# Patient Record
Sex: Female | Born: 1955 | Race: White | Hispanic: No | Marital: Married | Smoking: Never smoker
Health system: Southern US, Community
[De-identification: ages and names within clinical notes are randomized; demographics above are authoritative.]

## PROBLEM LIST (undated history)

## (undated) DIAGNOSIS — D649 Anemia, unspecified: Secondary | ICD-10-CM

## (undated) DIAGNOSIS — C50919 Malignant neoplasm of unspecified site of unspecified female breast: Secondary | ICD-10-CM

## (undated) DIAGNOSIS — E785 Hyperlipidemia, unspecified: Secondary | ICD-10-CM

## (undated) DIAGNOSIS — K219 Gastro-esophageal reflux disease without esophagitis: Secondary | ICD-10-CM

## (undated) DIAGNOSIS — F419 Anxiety disorder, unspecified: Secondary | ICD-10-CM

## (undated) DIAGNOSIS — K8020 Calculus of gallbladder without cholecystitis without obstruction: Secondary | ICD-10-CM

## (undated) DIAGNOSIS — I10 Essential (primary) hypertension: Secondary | ICD-10-CM

## (undated) HISTORY — DX: Essential (primary) hypertension: I10

## (undated) HISTORY — DX: Gastro-esophageal reflux disease without esophagitis: K21.9

## (undated) HISTORY — DX: Malignant neoplasm of unspecified site of unspecified female breast: C50.919

## (undated) HISTORY — DX: Anemia, unspecified: D64.9

## (undated) HISTORY — DX: Hyperlipidemia, unspecified: E78.5

## (undated) HISTORY — DX: Anxiety disorder, unspecified: F41.9

---

## 1997-04-03 HISTORY — PX: BILATERAL SALPINGECTOMY: SHX5743

## 2013-08-18 LAB — LIPID PANEL
CHOLESTEROL: 225 mg/dL — AB (ref 0–200)
HDL: 35 mg/dL (ref 35–70)
LDL Cholesterol: 166 mg/dL
TRIGLYCERIDES: 121 mg/dL (ref 40–160)

## 2014-08-19 LAB — TSH: TSH: 2.58 u[IU]/mL (ref <=5.90)

## 2014-08-19 LAB — HEMOGLOBIN A1C: Hgb A1c MFr Bld: 6 % (ref 4.0–6.0)

## 2014-09-24 LAB — HEPATIC FUNCTION PANEL
ALT: 58 U/L — AB (ref 7–35)
AST: 45 U/L — AB (ref 13–35)

## 2014-09-24 LAB — BASIC METABOLIC PANEL
BUN: 14 mg/dL (ref 4–21)
CREATININE: 0.8 mg/dL (ref 0.5–1.1)
Glucose: 111 mg/dL
Potassium: 4.6 mmol/L (ref 3.4–5.3)
Sodium: 139 mmol/L (ref 137–147)

## 2014-09-24 LAB — CBC AND DIFFERENTIAL
HCT: 39 % (ref 36–46)
HEMOGLOBIN: 13.2 g/dL (ref 12.0–16.0)
PLATELETS: 218 10*3/uL (ref 150–399)
WBC: 7.9 10*3/mL

## 2014-09-28 ENCOUNTER — Ambulatory Visit
Admit: 2014-09-28 | Disposition: A | Discharge status: Home / Self Care | Payer: Self-pay | Attending: Family Medicine | Admitting: Family Medicine

## 2015-02-19 DIAGNOSIS — R1083 Colic: Secondary | ICD-10-CM | POA: Insufficient documentation

## 2015-02-19 DIAGNOSIS — K219 Gastro-esophageal reflux disease without esophagitis: Secondary | ICD-10-CM | POA: Insufficient documentation

## 2015-02-19 DIAGNOSIS — E1169 Type 2 diabetes mellitus with other specified complication: Secondary | ICD-10-CM | POA: Insufficient documentation

## 2015-02-19 DIAGNOSIS — R7303 Prediabetes: Secondary | ICD-10-CM | POA: Insufficient documentation

## 2015-02-19 DIAGNOSIS — E785 Hyperlipidemia, unspecified: Secondary | ICD-10-CM | POA: Insufficient documentation

## 2015-02-19 DIAGNOSIS — F439 Reaction to severe stress, unspecified: Secondary | ICD-10-CM | POA: Insufficient documentation

## 2015-02-19 DIAGNOSIS — D509 Iron deficiency anemia, unspecified: Secondary | ICD-10-CM | POA: Insufficient documentation

## 2015-02-19 DIAGNOSIS — E7800 Pure hypercholesterolemia, unspecified: Secondary | ICD-10-CM | POA: Insufficient documentation

## 2015-02-19 DIAGNOSIS — R739 Hyperglycemia, unspecified: Secondary | ICD-10-CM

## 2015-02-19 DIAGNOSIS — R7989 Other specified abnormal findings of blood chemistry: Secondary | ICD-10-CM | POA: Insufficient documentation

## 2015-02-19 DIAGNOSIS — R945 Abnormal results of liver function studies: Secondary | ICD-10-CM | POA: Insufficient documentation

## 2015-02-19 DIAGNOSIS — E039 Hypothyroidism, unspecified: Secondary | ICD-10-CM | POA: Insufficient documentation

## 2015-02-19 DIAGNOSIS — I10 Essential (primary) hypertension: Secondary | ICD-10-CM | POA: Insufficient documentation

## 2015-02-22 ENCOUNTER — Other Ambulatory Visit: Payer: Self-pay | Admitting: Family Medicine

## 2015-02-22 ENCOUNTER — Ambulatory Visit (INDEPENDENT_AMBULATORY_CARE_PROVIDER_SITE_OTHER): Payer: Self-pay | Admitting: Family Medicine

## 2015-02-22 ENCOUNTER — Encounter: Payer: Self-pay | Admitting: Family Medicine

## 2015-02-22 VITALS — BP 128/80 | HR 88 | Temp 98.3°F | Resp 16 | Ht 63.0 in | Wt 147.0 lb

## 2015-02-22 DIAGNOSIS — E7800 Pure hypercholesterolemia, unspecified: Secondary | ICD-10-CM

## 2015-02-22 DIAGNOSIS — R7309 Other abnormal glucose: Secondary | ICD-10-CM

## 2015-02-22 DIAGNOSIS — E039 Hypothyroidism, unspecified: Secondary | ICD-10-CM

## 2015-02-22 DIAGNOSIS — H8110 Benign paroxysmal vertigo, unspecified ear: Secondary | ICD-10-CM

## 2015-02-22 DIAGNOSIS — R945 Abnormal results of liver function studies: Secondary | ICD-10-CM

## 2015-02-22 DIAGNOSIS — I10 Essential (primary) hypertension: Secondary | ICD-10-CM

## 2015-02-22 DIAGNOSIS — R7303 Prediabetes: Secondary | ICD-10-CM

## 2015-02-22 DIAGNOSIS — R7989 Other specified abnormal findings of blood chemistry: Secondary | ICD-10-CM

## 2015-02-22 NOTE — Progress Notes (Signed)
Patient ID: Diamond Hinton, female   DOB: 1956/04/04, 59 y.o.   MRN: [id number]        Patient: Diamond Hinton Female    DOB: 09-30-1955   59 y.o.   MRN: [id number] Visit Date: 02/22/2015  Today's Provider: Margarita Rana, MD   Chief Complaint  Patient presents with  . Diabetes  . Hypertension  . Hypothyroidism   Subjective:    HPI  Prediabetes, Follow-up:   Lab Results  Component Value Date   HGBA1C 6.0 08/19/2014    Last seen for for this 6 months ago.  Management since that visit includes no changes. Current symptoms include none.  Weight trend: gradually decreasing Prior visit with dietician: no Current diet: in general, a "healthy" diet   Current exercise: walking about 5 to 6 days.  Patient is requesting to have labs checked at [facility].  Pertinent Labs:    Component Value Date/Time   CHOL 225* 08/18/2013   TRIG 121 08/18/2013   CREATININE 0.8 09/24/2014    Wt Readings from Last 3 Encounters:  02/22/15 147 lb (66.679 kg)  09/24/14 155 lb (70.308 kg)     Hypertension, follow-up:  BP Readings from Last 3 Encounters:  02/22/15 128/80  09/24/14 118/64    She was last seen for hypertension 6 months ago.  BP at that visit was 118/64. Management changes since that visit include no changes. She reports excellent compliance with treatment. She is not having side effects.  She is exercising. She is adherent to low salt diet.   Outside blood pressures are being checked and reports WNL. She is experiencing none.  Patient denies chest pain.   Cardiovascular risk factors include none. Has had episodes of vertigo that last for months.  Ended up a Valium for a while.      Weight trend: stable Wt Readings from Last 3 Encounters:  02/22/15 147 lb (66.679 kg)  09/24/14 155 lb (70.308 kg)       Hypothyroid, follow-up:  TSH  Date Value Ref Range Status  08/19/2014 2.58 .41 - 5.90 uIU/mL Final   Wt Readings from Last 3 Encounters:  02/22/15 147 lb  (66.679 kg)  09/24/14 155 lb (70.308 kg)    She was last seen for hypothyroid 6 months ago.  Management since that visit includes no changes. She reports excellent compliance with treatment. She is not having side effects.  She is experiencing none She denies weight changes      Not on File Previous Medications   LEVOTHYROXINE (SYNTHROID) 75 MCG TABLET    Take 1 tablet by mouth daily.    Review of Systems  Constitutional: Negative.   Respiratory: Negative.   Gastrointestinal: Negative.   Endocrine: Negative.   Neurological: Positive for light-headedness.  Psychiatric/Behavioral: Negative.     Social History  Substance Use Topics  . Smoking status: Never Smoker   . Smokeless tobacco: Never Used  . Alcohol Use: No   Objective:   BP 128/80 mmHg  Pulse 88  Temp(Src) 98.3 F (36.8 C) (Oral)  Resp 16  Ht 5\' 3"  (1.6 m)  Wt 147 lb (66.679 kg)  BMI 26.05 kg/m2  Physical Exam  Constitutional: She is oriented to person, place, and time. She appears well-developed and well-nourished.  Cardiovascular: Normal rate and regular rhythm.   Pulmonary/Chest: Effort normal and breath sounds normal.  Neurological: She is alert and oriented to person, place, and time.  Psychiatric: She has a normal mood and affect. Her behavior is normal.  Judgment and thought content normal.      Assessment & Plan:     1. Borderline diabetes Will check labs.  - Hemoglobin A1c  2. Benign essential HTN Stable off medication. Continue to monitor.   3. Hypothyroidism, unspecified hypothyroidism type Condition is stable. Please continue current medication and  plan of care as noted.  Check labs.  - TSH  4. Benign paroxysmal positional vertigo, unspecified laterality Having trouble currently . Will do labyrinth exercises.   5. Hypercholesteremia Will check labs. Wants to work on lifestyle change.  - Comprehensive metabolic panel - Lipid panel  6. Abnormal LFTs Will check labs.   Patient  was seen and examined by Jerrell Belfast, MD, and note scribed, in part,  by Lynford Humphrey, CMA, and reviewed with patient.   I have reviewed the document for accuracy and completeness and I agree with above. - Jerrell Belfast, MD     Margarita Rana, MD  [facility]

## 2015-02-22 NOTE — Patient Instructions (Signed)
Benign Positional Vertigo Vertigo means you feel like you or your surroundings are moving when they are not. Benign positional vertigo is the most common form of vertigo. Benign means that the cause of your condition is not serious. Benign positional vertigo is more common in older adults. CAUSES  Benign positional vertigo is the result of an upset in the labyrinth system. This is an area in the middle ear that helps control your balance. This may be caused by a viral infection, head injury, or repetitive motion. However, often no specific cause is found. SYMPTOMS  Symptoms of benign positional vertigo occur when you move your head or eyes in different directions. Some of the symptoms may include:  Loss of balance and falls.  Vomiting.  Blurred vision.  Dizziness.  Nausea.  Involuntary eye movements (nystagmus). DIAGNOSIS  Benign positional vertigo is usually diagnosed by physical exam. If the specific cause of your benign positional vertigo is unknown, your caregiver may perform imaging tests, such as magnetic resonance imaging (MRI) or computed tomography (CT). TREATMENT  Your caregiver may recommend movements or procedures to correct the benign positional vertigo. Medicines such as meclizine, benzodiazepines, and medicines for nausea may be used to treat your symptoms. In rare cases, if your symptoms are caused by certain conditions that affect the inner ear, you may need surgery. HOME CARE INSTRUCTIONS   Follow your caregiver's instructions.  Move slowly. Do not make sudden body or head movements.  Avoid driving.  Avoid operating heavy machinery.  Avoid performing any tasks that would be dangerous to you or others during a vertigo episode.  Drink enough fluids to keep your urine clear or pale yellow. SEEK IMMEDIATE MEDICAL CARE IF:   You develop problems with walking, weakness, numbness, or using your arms, hands, or legs.  You have difficulty speaking.  You develop  severe headaches.  Your nausea or vomiting continues or gets worse.  You develop visual changes.  Your family or friends notice any behavioral changes.  Your condition gets worse.  You have a fever.  You develop a stiff neck or sensitivity to light. MAKE SURE YOU:   Understand these instructions.  Will watch your condition.  Will get help right away if you are not doing well or get worse. Document Released: 03/06/2006 Document Revised: 08/21/2011 Document Reviewed: 02/16/2011 Exit[employer] Patient Information 2015 [employer]. This information is not intended to replace advice given to you by your health care provider. Make sure you discuss any questions you have with your health care provider.    

## 2015-02-23 ENCOUNTER — Telehealth: Payer: Self-pay

## 2015-02-23 LAB — COMPREHENSIVE METABOLIC PANEL
A/G RATIO: 1.8 (ref 1.1–2.5)
ALBUMIN: 4.6 g/dL (ref 3.6–4.8)
ALK PHOS: 63 IU/L (ref 39–117)
ALT: 19 IU/L (ref 0–32)
AST: 17 IU/L (ref 0–40)
BILIRUBIN TOTAL: 0.4 mg/dL (ref 0.0–1.2)
BUN / CREAT RATIO: 20 (ref 11–26)
BUN: 16 mg/dL (ref 8–27)
CHLORIDE: 102 mmol/L (ref 97–108)
CO2: 25 mmol/L (ref 18–29)
Calcium: 10.1 mg/dL (ref 8.7–10.3)
Creatinine, Ser: 0.81 mg/dL (ref 0.57–1.00)
GFR calc non Af Amer: 79 mL/min/{1.73_m2} (ref >59)
GFR, EST AFRICAN AMERICAN: 91 mL/min/{1.73_m2} (ref >59)
GLOBULIN, TOTAL: 2.6 g/dL (ref 1.5–4.5)
GLUCOSE: 115 mg/dL — AB (ref 65–99)
POTASSIUM: 5.2 mmol/L (ref 3.5–5.2)
SODIUM: 141 mmol/L (ref 134–144)
Total Protein: 7.2 g/dL (ref 6.0–8.5)

## 2015-02-23 LAB — LIPID PANEL
CHOLESTEROL TOTAL: 174 mg/dL (ref 100–199)
Chol/HDL Ratio: 5.3 ratio units — ABNORMAL HIGH (ref 0.0–4.4)
HDL: 33 mg/dL — ABNORMAL LOW (ref >39)
LDL Calculated: 107 mg/dL — ABNORMAL HIGH (ref 0–99)
Triglycerides: 172 mg/dL — ABNORMAL HIGH (ref 0–149)
VLDL Cholesterol Cal: 34 mg/dL (ref 5–40)

## 2015-02-23 LAB — HEMOGLOBIN A1C
ESTIMATED AVERAGE GLUCOSE: 126 mg/dL
HEMOGLOBIN A1C: 6 % — AB (ref 4.8–5.6)

## 2015-02-23 LAB — TSH: TSH: 4.25 u[IU]/mL (ref 0.450–4.500)

## 2015-02-23 NOTE — Telephone Encounter (Signed)
Advised pt as directed below. Pt verbalized fully understanding.  Thanks,   

## 2015-02-23 NOTE — Telephone Encounter (Signed)
-----   Message from Margarita Rana, MD sent at 02/23/2015  7:46 AM EDT ----- Labs stable.  Please notify patient. Thanks.

## 2015-05-24 ENCOUNTER — Other Ambulatory Visit: Payer: Self-pay

## 2015-05-24 DIAGNOSIS — E039 Hypothyroidism, unspecified: Secondary | ICD-10-CM

## 2015-05-24 MED ORDER — LEVOTHYROXINE SODIUM 75 MCG PO TABS: 75.0000 ug | ORAL_TABLET | Freq: Every day | ORAL | Status: DC

## 2015-07-06 ENCOUNTER — Emergency Department: Payer: Self-pay

## 2015-07-06 ENCOUNTER — Encounter: Payer: Self-pay | Admitting: Emergency Medicine

## 2015-07-06 ENCOUNTER — Observation Stay
Admission: EM | Admit: 2015-07-06 | Discharge: 2015-07-08 | Disposition: A | Discharge status: Home / Self Care | Payer: Self-pay | Attending: Surgery | Admitting: Surgery

## 2015-07-06 DIAGNOSIS — I10 Essential (primary) hypertension: Secondary | ICD-10-CM | POA: Insufficient documentation

## 2015-07-06 DIAGNOSIS — K821 Hydrops of gallbladder: Secondary | ICD-10-CM | POA: Insufficient documentation

## 2015-07-06 DIAGNOSIS — K8000 Calculus of gallbladder with acute cholecystitis without obstruction: Principal | ICD-10-CM | POA: Insufficient documentation

## 2015-07-06 DIAGNOSIS — Z8249 Family history of ischemic heart disease and other diseases of the circulatory system: Secondary | ICD-10-CM | POA: Insufficient documentation

## 2015-07-06 DIAGNOSIS — R1011 Right upper quadrant pain: Secondary | ICD-10-CM | POA: Insufficient documentation

## 2015-07-06 DIAGNOSIS — Z419 Encounter for procedure for purposes other than remedying health state, unspecified: Secondary | ICD-10-CM

## 2015-07-06 DIAGNOSIS — E785 Hyperlipidemia, unspecified: Secondary | ICD-10-CM | POA: Insufficient documentation

## 2015-07-06 DIAGNOSIS — K810 Acute cholecystitis: Secondary | ICD-10-CM | POA: Diagnosis present

## 2015-07-06 DIAGNOSIS — K828 Other specified diseases of gallbladder: Secondary | ICD-10-CM | POA: Insufficient documentation

## 2015-07-06 DIAGNOSIS — Z833 Family history of diabetes mellitus: Secondary | ICD-10-CM | POA: Insufficient documentation

## 2015-07-06 DIAGNOSIS — F419 Anxiety disorder, unspecified: Secondary | ICD-10-CM | POA: Insufficient documentation

## 2015-07-06 DIAGNOSIS — D649 Anemia, unspecified: Secondary | ICD-10-CM | POA: Insufficient documentation

## 2015-07-06 DIAGNOSIS — Z91010 Allergy to peanuts: Secondary | ICD-10-CM | POA: Insufficient documentation

## 2015-07-06 DIAGNOSIS — K219 Gastro-esophageal reflux disease without esophagitis: Secondary | ICD-10-CM | POA: Insufficient documentation

## 2015-07-06 DIAGNOSIS — Z823 Family history of stroke: Secondary | ICD-10-CM | POA: Insufficient documentation

## 2015-07-06 HISTORY — DX: Calculus of gallbladder without cholecystitis without obstruction: K80.20

## 2015-07-06 LAB — URINALYSIS COMPLETE WITH MICROSCOPIC (ARMC ONLY)
BACTERIA UA: NONE SEEN
Bilirubin Urine: NEGATIVE
Glucose, UA: NEGATIVE mg/dL
HGB URINE DIPSTICK: NEGATIVE
Ketones, ur: NEGATIVE mg/dL
LEUKOCYTES UA: NEGATIVE
Nitrite: NEGATIVE
PH: 5 (ref 5.0–8.0)
Protein, ur: NEGATIVE mg/dL
Specific Gravity, Urine: 1.016 (ref 1.005–1.030)

## 2015-07-06 LAB — COMPREHENSIVE METABOLIC PANEL
ALT: 21 U/L (ref 14–54)
AST: 20 U/L (ref 15–41)
Albumin: 4.7 g/dL (ref 3.5–5.0)
Alkaline Phosphatase: 64 U/L (ref 38–126)
Anion gap: 9 (ref 5–15)
BUN: 10 mg/dL (ref 6–20)
CHLORIDE: 104 mmol/L (ref 101–111)
CO2: 25 mmol/L (ref 22–32)
CREATININE: 0.84 mg/dL (ref 0.44–1.00)
Calcium: 10.1 mg/dL (ref 8.9–10.3)
GFR calc Af Amer: >60 mL/min (ref >60)
Glucose, Bld: 129 mg/dL — ABNORMAL HIGH (ref 65–99)
POTASSIUM: 3.6 mmol/L (ref 3.5–5.1)
SODIUM: 138 mmol/L (ref 135–145)
Total Bilirubin: 0.3 mg/dL (ref 0.3–1.2)
Total Protein: 7.6 g/dL (ref 6.5–8.1)

## 2015-07-06 LAB — CBC
HEMATOCRIT: 43.6 % (ref 35.0–47.0)
Hemoglobin: 14.5 g/dL (ref 12.0–16.0)
MCH: 28.1 pg (ref 26.0–34.0)
MCHC: 33.3 g/dL (ref 32.0–36.0)
MCV: 84.3 fL (ref 80.0–100.0)
Platelets: 244 10*3/uL (ref 150–440)
RBC: 5.17 MIL/uL (ref 3.80–5.20)
RDW: 13.4 % (ref 11.5–14.5)
WBC: 13 10*3/uL — AB (ref 3.6–11.0)

## 2015-07-06 LAB — LIPASE, BLOOD: LIPASE: 31 U/L (ref 11–51)

## 2015-07-06 MED ORDER — KCL IN DEXTROSE-NACL 20-5-0.9 MEQ/L-%-% IV SOLN
INTRAVENOUS | Status: DC
  Administered 2015-07-06 – 2015-07-08 (×4): via INTRAVENOUS
  Filled 2015-07-06 (×10): qty 1000

## 2015-07-06 MED ORDER — PROMETHAZINE HCL 25 MG PO TABS: 12.5000 mg | ORAL_TABLET | Freq: Four times a day (QID) | ORAL | Status: DC | PRN

## 2015-07-06 MED ORDER — HEPARIN SODIUM (PORCINE) 5000 UNIT/ML IJ SOLN
5000.0000 [IU] | Freq: Three times a day (TID) | INTRAMUSCULAR | Status: DC
Start: 2015-07-06 — End: 2015-07-08
  Filled 2015-07-06 (×2): qty 1

## 2015-07-06 MED ORDER — SODIUM CHLORIDE 0.9 % IV BOLUS (SEPSIS)
1000.0000 mL | Freq: Once | INTRAVENOUS | Status: AC
  Administered 2015-07-06: 1000 mL via INTRAVENOUS

## 2015-07-06 MED ORDER — MORPHINE SULFATE (PF) 4 MG/ML IV SOLN
INTRAVENOUS | Status: AC
  Administered 2015-07-06: 4 mg via INTRAVENOUS
  Filled 2015-07-06: qty 1

## 2015-07-06 MED ORDER — CEFAZOLIN SODIUM 1-5 GM-% IV SOLN
1.0000 g | Freq: Four times a day (QID) | INTRAVENOUS | Status: DC
  Administered 2015-07-06 – 2015-07-07 (×3): 1 g via INTRAVENOUS
  Filled 2015-07-06 (×5): qty 50

## 2015-07-06 MED ORDER — METOPROLOL TARTRATE 25 MG PO TABS
25.0000 mg | ORAL_TABLET | Freq: Two times a day (BID) | ORAL | Status: DC
  Administered 2015-07-06 – 2015-07-07 (×3): 25 mg via ORAL
  Filled 2015-07-06 (×4): qty 1

## 2015-07-06 MED ORDER — ONDANSETRON HCL 4 MG/2ML IJ SOLN
4.0000 mg | Freq: Once | INTRAMUSCULAR | Status: AC
  Administered 2015-07-06: 4 mg via INTRAVENOUS

## 2015-07-06 MED ORDER — HYDROMORPHONE HCL 1 MG/ML IJ SOLN
0.5000 mg | INTRAMUSCULAR | Status: DC | PRN
  Administered 2015-07-06 – 2015-07-07 (×7): 0.5 mg via INTRAVENOUS
  Filled 2015-07-06 (×7): qty 1

## 2015-07-06 MED ORDER — ONDANSETRON HCL 4 MG/2ML IJ SOLN
INTRAMUSCULAR | Status: AC
  Administered 2015-07-06: 4 mg via INTRAVENOUS
  Filled 2015-07-06: qty 2

## 2015-07-06 MED ORDER — ONDANSETRON HCL 4 MG/2ML IJ SOLN
4.0000 mg | Freq: Once | INTRAMUSCULAR | Status: AC
  Administered 2015-07-06: 4 mg via INTRAVENOUS
  Filled 2015-07-06: qty 2

## 2015-07-06 MED ORDER — LEVOTHYROXINE SODIUM 75 MCG PO TABS
75.0000 ug | ORAL_TABLET | Freq: Every day | ORAL | Status: DC
  Filled 2015-07-06: qty 1

## 2015-07-06 MED ORDER — MORPHINE SULFATE (PF) 4 MG/ML IV SOLN
4.0000 mg | Freq: Once | INTRAVENOUS | Status: AC
  Administered 2015-07-06: 4 mg via INTRAVENOUS

## 2015-07-06 MED ORDER — HYDROMORPHONE HCL 1 MG/ML IJ SOLN
0.5000 mg | Freq: Once | INTRAMUSCULAR | Status: AC
  Administered 2015-07-06: 0.5 mg via INTRAVENOUS

## 2015-07-06 MED ORDER — HYDROMORPHONE HCL 1 MG/ML IJ SOLN
INTRAMUSCULAR | Status: AC
  Administered 2015-07-06: 0.5 mg via INTRAVENOUS
  Filled 2015-07-06: qty 1

## 2015-07-06 MED ORDER — PANTOPRAZOLE SODIUM 40 MG IV SOLR
40.0000 mg | Freq: Two times a day (BID) | INTRAVENOUS | Status: DC
  Administered 2015-07-06 – 2015-07-07 (×3): 40 mg via INTRAVENOUS
  Filled 2015-07-06 (×4): qty 40

## 2015-07-06 MED ORDER — PROMETHAZINE HCL 25 MG RE SUPP: 25.0000 mg | Freq: Once | RECTAL | Status: DC

## 2015-07-06 NOTE — Progress Notes (Signed)
Pt arrived on unit. BP 177/84. Schedule Lopressor was given. Will reassess BP and monitor pt.  Diamond Hinton

## 2015-07-06 NOTE — ED Notes (Signed)
Patient transported to Ultrasound 

## 2015-07-06 NOTE — ED Provider Notes (Signed)
[facility] Emergency Department Provider Note    ____________________________________________  Time seen: 1650  I have reviewed the triage vital signs and the nursing notes.   HISTORY  Chief Complaint Abdominal Pain   History limited by: Not Limited   HPI Diamond Hinton is a 60 y.o. female who presents to the emergency department today because of concern for epigastric pain and gallbladder disease. The patient starts that the symptoms started roughly 14 hours ago. She states that it wasn't severe. It was located in the epigastric region. She states she did eat a high fat meal last night. She tried taking oral medications without great relief of the pain. She did have some nausea. She denies any fevers. She states she was diagnosed with gallstones last year although never followed up with a Psychologist, sport and exercise. She had been trying diet control.     Past Medical History  Diagnosis Date  . Anxiety   . Hypertension   . Hyperlipidemia   . GERD (gastroesophageal reflux disease)   . Anemia   . Gallstones     Patient Active Problem List   Diagnosis Date Noted  . Benign paroxysmal positional vertigo 02/22/2015  . Abdominal colic [id number]  . Benign essential HTN 02/19/2015  . Hypercholesteremia 02/19/2015  . Abnormal LFTs 02/19/2015  . Acid reflux 02/19/2015  . Adult hypothyroidism 02/19/2015  . Anemia, iron deficiency 02/19/2015  . Feeling stressed out 02/19/2015  . Borderline diabetes 02/19/2015    Past Surgical History  Procedure Laterality Date  . Bilateral salpingectomy  04/03/1997    Current Outpatient Rx  Name  Route  Sig  Dispense  Refill  . levothyroxine (SYNTHROID) 75 MCG tablet   Oral   Take 1 tablet (75 mcg total) by mouth daily.   90 tablet   1     Allergies Review of patient's allergies indicates no known allergies.  Family History  Problem Relation Age of Onset  . Hypertension Mother   . CVA Mother   . Ulcers Mother   . Heart  disease Father   . Hypertension Sister   . Diabetes Sister   . Hypertension Sister   . Diabetes Sister     Social History Social History  Substance Use Topics  . Smoking status: Never Smoker   . Smokeless tobacco: Never Used  . Alcohol Use: No    Review of Systems  Constitutional: Negative for fever. Cardiovascular: Negative for chest pain. Respiratory: Negative for shortness of breath. Gastrointestinal: Positive for abdominal pain. Neurological: Negative for headaches, focal weakness or numbness.   10-point ROS otherwise negative.  ____________________________________________   PHYSICAL EXAM:  VITAL SIGNS: ED Triage Vitals  Enc Vitals Group     BP 07/06/15 1510 174/99 mmHg     Pulse Rate 07/06/15 1510 100     Resp 07/06/15 1510 18     Temp 07/06/15 1510 98 F (36.7 C)     Temp Source 07/06/15 1510 Oral     SpO2 07/06/15 1510 100 %     Weight 07/06/15 1510 150 lb (68.04 kg)     Height 07/06/15 1510 5\' 3"  (1.6 m)     Head Cir --      Peak Flow --      Pain Score 07/06/15 1511 7   Constitutional: Alert and oriented. Well appearing and in no distress. Eyes: Conjunctivae are normal. PERRL. Normal extraocular movements. ENT   Head: Normocephalic and atraumatic.   Nose: No congestion/rhinnorhea.   Mouth/Throat: Mucous membranes are  moist.   Neck: No stridor. Hematological/Lymphatic/Immunilogical: No cervical lymphadenopathy. Cardiovascular: Normal rate, regular rhythm.  No murmurs, rubs, or gallops. Respiratory: Normal respiratory effort without tachypnea nor retractions. Breath sounds are clear and equal bilaterally. No wheezes/rales/rhonchi. Gastrointestinal: Soft and moderately tender to palpation in the right upper quadrant and epigastric region. Genitourinary: Deferred Musculoskeletal: Normal range of motion in all extremities. No joint effusions.  No lower extremity tenderness nor edema. Neurologic:  Normal speech and language. No gross focal  neurologic deficits are appreciated.  Skin:  Skin is warm, dry and intact. No rash noted. Psychiatric: Mood and affect are normal. Speech and behavior are normal. Patient exhibits appropriate insight and judgment.  ____________________________________________    LABS (pertinent positives/negatives)  Labs Reviewed  COMPREHENSIVE METABOLIC PANEL - Abnormal; Notable for the following:    Glucose, Bld 129 (*)    All other components within normal limits  CBC - Abnormal; Notable for the following:    WBC 13.0 (*)    All other components within normal limits  URINALYSIS COMPLETEWITH MICROSCOPIC ([facility] ONLY) - Abnormal; Notable for the following:    Color, Urine YELLOW (*)    APPearance CLEAR (*)    Squamous Epithelial / LPF 0-5 (*)    All other components within normal limits  LIPASE, BLOOD    ____________________________________________   EKG  I, Nance Pear, attending physician, personally viewed and interpreted this EKG  EKG Time: 1518 Rate: 81 Rhythm: normal sinus rhythm Axis: left axis deviation Intervals: qtc 441 QRS: incomplete RBBB ST changes: no st elevation Impression: abnormal ekg ____________________________________________    RADIOLOGY  US abdomen RUQ  IMPRESSION: Cholelithiasis without sonographic evidence for acute cholecystitis. ____________________________________________   PROCEDURES  Procedure(s) performed: None  Critical Care performed: No  ____________________________________________   INITIAL IMPRESSION / ASSESSMENT AND PLAN / ED COURSE  Pertinent labs & imaging results that were available during my care of the patient were reviewed by me and considered in my medical decision making (see chart for details).  Patient presented to the emergency department today because of concerns for epigastric pain and a gallbladder attack. She has a history of known gallbladders. Ultrasound without any acute findings of cholecystitis. Patient with  mild leukocytosis. Patient's pain was not able to be controlled on IV medications and surgery was consulted. They will plan on admitting the patient.  ____________________________________________   FINAL CLINICAL IMPRESSION(S) / ED DIAGNOSES  Final diagnoses:  RUQ pain     Nance Pear, MD 07/06/15 2013

## 2015-07-06 NOTE — ED Notes (Signed)
Patient states "I'm having a gallbladder attack".  Onset of symptoms 0200.  States ate a high fat dinner last night and awoke at 0200 with pain.  Has taken Peptobismol, ibuprophen, tylenol, no effect.  Patient has known gallstones.

## 2015-07-06 NOTE — H&P (Signed)
Diamond Hinton is an 60 y.o. female.    Chief Complaint: Right upper quadrant and epigastric pain  HPI: This a patient who is had multiple episodes of right upper quadrant pain associated fatty food intolerance in the past in fact she was told in me that she had gallstones on an ultrasound that was performed for her symptoms. She's had this happened multiple times but this time it started at 2 in the morning following a fatty meal and is been unrelenting she take Zantac with no improvement and she took some Pepto-Bismol that had minor improvement. She's had nausea but no emesis and no fevers or chills no jaundice or acholic stools and no melena or hematochezia she's had normal bowel movements.  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 Anxiety   . Hypertension   . Hyperlipidemia   . GERD (gastroesophageal reflux disease)   . Anemia   . Gallstones     Past Surgical History  Procedure Laterality Date  . Bilateral salpingectomy  04/03/1997    Family History  Problem Relation Age of Onset  . Hypertension Mother   . CVA Mother   . Ulcers Mother   . Heart disease Father   . Hypertension Sister   . Diabetes Sister   . Hypertension Sister   . Diabetes Sister    Social History:  reports that she has never smoked. She has never used smokeless tobacco. She reports that she does not drink alcohol or use illicit drugs.  Allergies:  Allergies  Allergen Reactions  . Peanuts [Peanut Oil] Shortness Of Breath and Itching     (Not in a hospital admission)   Review of Systems  Constitutional: Negative for fever, chills and weight loss.  HENT: Negative.   Eyes: Negative.   Respiratory: Negative.   Cardiovascular: Negative.   Gastrointestinal: Positive for nausea and abdominal pain. Negative for heartburn, vomiting, diarrhea, constipation, blood in stool and melena.  Genitourinary: Negative.    Musculoskeletal: Negative.   Skin: Negative.   Neurological: Negative.   Endo/Heme/Allergies: Negative.   Psychiatric/Behavioral: Negative.      Physical Exam:  BP 160/79 mmHg  Pulse 73  Temp(Src) 98 F (36.7 C) (Oral)  Resp 18  Ht 5' 3"  (1.6 m)  Wt 150 lb (68.04 kg)  BMI 26.58 kg/m2  SpO2 100%  Physical Exam  Constitutional: She is oriented to person, place, and time and well-developed, well-nourished, and in no distress. No distress.  Appears uncomfortable  HENT:  Head: Normocephalic and atraumatic.  Eyes: Pupils are equal, round, and reactive to light. Right eye exhibits no discharge. Left eye exhibits no discharge. No scleral icterus.  Neck: Normal range of motion. Neck supple.  Cardiovascular: Normal rate, regular rhythm and normal heart sounds.   Pulmonary/Chest: Effort normal and breath sounds normal. No respiratory distress. She has no wheezes.  Abdominal: Soft. She exhibits no distension. There is tenderness. There is no rebound and no guarding.  Tenderness maximal in the right upper quadrant with a positive Murphy sign  Musculoskeletal: Normal range of motion. She exhibits no edema.  Lymphadenopathy:    She has no cervical adenopathy.  Neurological: She is alert and oriented to person, place, and time.  Skin: Skin is warm and dry. No rash noted. She is not diaphoretic. No erythema.  Psychiatric: Mood and affect normal.  Vitals reviewed.       Results  for orders placed or performed during the hospital encounter of 07/06/15 (from the past 48 hour(s))  Lipase, blood     Status: None   Collection Time: 07/06/15  3:24 PM  Result Value Ref Range   Lipase 31 11 - 51 U/L  Comprehensive metabolic panel     Status: Abnormal   Collection Time: 07/06/15  3:24 PM  Result Value Ref Range   Sodium 138 135 - 145 mmol/L   Potassium 3.6 3.5 - 5.1 mmol/L   Chloride 104 101 - 111 mmol/L   CO2 25 22 - 32 mmol/L   Glucose, Bld 129 (H) 65 - 99 mg/dL   BUN 10 6 - 20  mg/dL   Creatinine, Ser 0.84 0.44 - 1.00 mg/dL   Calcium 10.1 8.9 - 10.3 mg/dL   Total Protein 7.6 6.5 - 8.1 g/dL   Albumin 4.7 3.5 - 5.0 g/dL   AST 20 15 - 41 U/L   ALT 21 14 - 54 U/L   Alkaline Phosphatase 64 38 - 126 U/L   Total Bilirubin 0.3 0.3 - 1.2 mg/dL   GFR calc non Af Amer >60 >60 mL/min   GFR calc Af Amer >60 >60 mL/min    Comment: (NOTE) The eGFR has been calculated using the CKD EPI equation. This calculation has not been validated in all clinical situations. eGFR's persistently <60 mL/min signify possible Chronic Kidney Disease.    Anion gap 9 5 - 15  CBC     Status: Abnormal   Collection Time: 07/06/15  3:24 PM  Result Value Ref Range   WBC 13.0 (H) 3.6 - 11.0 K/uL   RBC 5.17 3.80 - 5.20 MIL/uL   Hemoglobin 14.5 12.0 - 16.0 g/dL   HCT 43.6 35.0 - 47.0 %   MCV 84.3 80.0 - 100.0 fL   MCH 28.1 26.0 - 34.0 pg   MCHC 33.3 32.0 - 36.0 g/dL   RDW 13.4 11.5 - 14.5 %   Platelets 244 150 - 440 K/uL  Urinalysis complete, with microscopic ([facility] only)     Status: Abnormal   Collection Time: 07/06/15  3:24 PM  Result Value Ref Range   Color, Urine YELLOW (A) YELLOW   APPearance CLEAR (A) CLEAR   Glucose, UA NEGATIVE NEGATIVE mg/dL   Bilirubin Urine NEGATIVE NEGATIVE   Ketones, ur NEGATIVE NEGATIVE mg/dL   Specific Gravity, Urine 1.016 1.005 - 1.030   Hgb urine dipstick NEGATIVE NEGATIVE   pH 5.0 5.0 - 8.0   Protein, ur NEGATIVE NEGATIVE mg/dL   Nitrite NEGATIVE NEGATIVE   Leukocytes, UA NEGATIVE NEGATIVE   RBC / HPF 0-5 0 - 5 RBC/hpf   WBC, UA 0-5 0 - 5 WBC/hpf   Bacteria, UA NONE SEEN NONE SEEN   Squamous Epithelial / LPF 0-5 (A) NONE SEEN   Mucous PRESENT    US Abdomen Limited Ruq  07/06/2015  CLINICAL DATA:  Patient with right upper quadrant pain.  Nausea. EXAM: US ABDOMEN LIMITED - RIGHT UPPER QUADRANT COMPARISON:  None. FINDINGS: Gallbladder: Multiple mobile echogenic stones are demonstrated within the gallbladder lumen. No gallbladder wall thickening. No  pericholecystic fluid. Negative sonographic Murphy sign. Common bile duct: Diameter: 4 mm Liver: No focal lesion identified. Within normal limits in parenchymal echogenicity. IMPRESSION: Cholelithiasis without sonographic evidence for acute cholecystitis. Electronically Signed   By: Lovey Newcomer M.D.   On: 07/06/2015 16:33     Assessment/Plan  Labs are personally reviewed. An ultrasound shows stones but no cholecystic fluid or wall  thickening and a negative sonographic Murphy sign but clinically the patient does have a positive Murphy sign. Her history is consistent with recurrent episodic biliary colic-like symptoms which is now unrelenting which is consistent with a diagnosis of acute cholecystitis.  I recommended admission to the hospital hydration starting IV antibiotics and controlling her nausea and pain then proceeding with laparoscopic cholecystectomy tomorrow. I discussed with she and her family the rationale for this approach the options of observation the risks of bleeding infection recurrence of symptoms failure to resolve all of her symptoms (procedure bile duct damage bile duct leak retained common bile duct stone any of which could require further surgery and/or ERCP stent and papillotomy this reviewed for them they understood and agreed to proceed.  Florene Glen, MD, FACS

## 2015-07-07 ENCOUNTER — Observation Stay: Payer: Self-pay | Admitting: Anesthesiology

## 2015-07-07 ENCOUNTER — Encounter
Admission: EM | Disposition: A | Discharge status: Home / Self Care | Payer: Self-pay | Source: Home / Self Care | Attending: Emergency Medicine

## 2015-07-07 ENCOUNTER — Encounter: Payer: Self-pay | Admitting: *Deleted

## 2015-07-07 DIAGNOSIS — K810 Acute cholecystitis: Secondary | ICD-10-CM

## 2015-07-07 HISTORY — PX: GALLBLADDER SURGERY: SHX652

## 2015-07-07 HISTORY — PX: CHOLECYSTECTOMY: SHX55

## 2015-07-07 LAB — CBC
HEMATOCRIT: 40.1 % (ref 35.0–47.0)
HEMOGLOBIN: 13.6 g/dL (ref 12.0–16.0)
MCH: 29.2 pg (ref 26.0–34.0)
MCHC: 33.9 g/dL (ref 32.0–36.0)
MCV: 86.2 fL (ref 80.0–100.0)
Platelets: 227 10*3/uL (ref 150–440)
RBC: 4.66 MIL/uL (ref 3.80–5.20)
RDW: 13.1 % (ref 11.5–14.5)
WBC: 17 10*3/uL — AB (ref 3.6–11.0)

## 2015-07-07 LAB — COMPREHENSIVE METABOLIC PANEL
ALBUMIN: 4 g/dL (ref 3.5–5.0)
ALT: 17 U/L (ref 14–54)
ANION GAP: 7 (ref 5–15)
AST: 18 U/L (ref 15–41)
Alkaline Phosphatase: 52 U/L (ref 38–126)
BILIRUBIN TOTAL: 0.3 mg/dL (ref 0.3–1.2)
BUN: 8 mg/dL (ref 6–20)
CALCIUM: 8.5 mg/dL — AB (ref 8.9–10.3)
CO2: 26 mmol/L (ref 22–32)
Chloride: 100 mmol/L — ABNORMAL LOW (ref 101–111)
Creatinine, Ser: 0.84 mg/dL (ref 0.44–1.00)
GLUCOSE: 167 mg/dL — AB (ref 65–99)
POTASSIUM: 3.5 mmol/L (ref 3.5–5.1)
Sodium: 133 mmol/L — ABNORMAL LOW (ref 135–145)
TOTAL PROTEIN: 6.7 g/dL (ref 6.5–8.1)

## 2015-07-07 LAB — MRSA PCR SCREENING: MRSA BY PCR: NEGATIVE

## 2015-07-07 SURGERY — LAPAROSCOPIC CHOLECYSTECTOMY WITH INTRAOPERATIVE CHOLANGIOGRAM
Anesthesia: General | Site: Abdomen

## 2015-07-07 MED ORDER — METRONIDAZOLE IN NACL 5-0.79 MG/ML-% IV SOLN
INTRAVENOUS | Status: DC | PRN
  Administered 2015-07-07: 500 mg via INTRAVENOUS

## 2015-07-07 MED ORDER — KETOROLAC TROMETHAMINE 30 MG/ML IJ SOLN
INTRAMUSCULAR | Status: DC | PRN
  Administered 2015-07-07: 30 mg via INTRAVENOUS

## 2015-07-07 MED ORDER — DEXTROSE 5 % IV SOLN
1.0000 g | Freq: Four times a day (QID) | INTRAVENOUS | Status: AC
  Administered 2015-07-08 (×2): 1 g via INTRAVENOUS
  Filled 2015-07-07 (×2): qty 1

## 2015-07-07 MED ORDER — METRONIDAZOLE IN NACL 5-0.79 MG/ML-% IV SOLN
500.0000 mg | Freq: Once | INTRAVENOUS | Status: DC
  Filled 2015-07-07: qty 100

## 2015-07-07 MED ORDER — LACTATED RINGERS IV SOLN
INTRAVENOUS | Status: DC
  Administered 2015-07-07: 14:00:00 via INTRAVENOUS

## 2015-07-07 MED ORDER — BUPIVACAINE-EPINEPHRINE 0.25% -1:200000 IJ SOLN
INTRAMUSCULAR | Status: DC | PRN
  Administered 2015-07-07: 30 mL

## 2015-07-07 MED ORDER — SUGAMMADEX SODIUM 200 MG/2ML IV SOLN
INTRAVENOUS | Status: DC | PRN
  Administered 2015-07-07: 142.4 mg via INTRAVENOUS

## 2015-07-07 MED ORDER — PROPOFOL 10 MG/ML IV BOLUS
INTRAVENOUS | Status: DC | PRN
  Administered 2015-07-07: 150 mg via INTRAVENOUS

## 2015-07-07 MED ORDER — ONDANSETRON HCL 4 MG/2ML IJ SOLN
INTRAMUSCULAR | Status: DC | PRN
  Administered 2015-07-07: 4 mg via INTRAVENOUS

## 2015-07-07 MED ORDER — DEXTROSE 5 % IV SOLN
1.0000 g | Freq: Four times a day (QID) | INTRAVENOUS | Status: DC
  Administered 2015-07-07: 1 g via INTRAVENOUS
  Filled 2015-07-07 (×3): qty 1

## 2015-07-07 MED ORDER — CEFAZOLIN SODIUM-DEXTROSE 2-3 GM-% IV SOLR
INTRAVENOUS | Status: DC | PRN
  Administered 2015-07-07: 2 g via INTRAVENOUS

## 2015-07-07 MED ORDER — ACETAMINOPHEN 10 MG/ML IV SOLN
INTRAVENOUS | Status: AC
  Filled 2015-07-07: qty 100

## 2015-07-07 MED ORDER — ONDANSETRON HCL 4 MG/2ML IJ SOLN: 4.0000 mg | Freq: Once | INTRAMUSCULAR | Status: DC | PRN

## 2015-07-07 MED ORDER — MIDAZOLAM HCL 5 MG/5ML IJ SOLN: INTRAMUSCULAR | Status: DC | PRN

## 2015-07-07 MED ORDER — ACETAMINOPHEN 500 MG PO TABS: 500.0000 mg | ORAL_TABLET | Freq: Four times a day (QID) | ORAL | Status: DC | PRN

## 2015-07-07 MED ORDER — LIDOCAINE HCL (CARDIAC) 20 MG/ML IV SOLN
INTRAVENOUS | Status: DC | PRN
  Administered 2015-07-07: 60 mg via INTRAVENOUS

## 2015-07-07 MED ORDER — DEXAMETHASONE SODIUM PHOSPHATE 10 MG/ML IJ SOLN
INTRAMUSCULAR | Status: DC | PRN
  Administered 2015-07-07: 10 mg via INTRAVENOUS

## 2015-07-07 MED ORDER — HYDROCHLOROTHIAZIDE 25 MG PO TABS: 25.0000 mg | ORAL_TABLET | Freq: Every day | ORAL | Status: DC | PRN

## 2015-07-07 MED ORDER — FENTANYL CITRATE (PF) 100 MCG/2ML IJ SOLN: 25.0000 ug | INTRAMUSCULAR | Status: DC | PRN

## 2015-07-07 MED ORDER — SUCCINYLCHOLINE CHLORIDE 20 MG/ML IJ SOLN
INTRAMUSCULAR | Status: DC | PRN
  Administered 2015-07-07: 100 mg via INTRAVENOUS

## 2015-07-07 MED ORDER — MIDAZOLAM HCL 5 MG/5ML IJ SOLN
INTRAMUSCULAR | Status: DC | PRN
  Administered 2015-07-07: 2 mg via INTRAVENOUS

## 2015-07-07 MED ORDER — FENTANYL CITRATE (PF) 250 MCG/5ML IJ SOLN
INTRAMUSCULAR | Status: DC | PRN
  Administered 2015-07-07 (×2): 50 ug via INTRAVENOUS

## 2015-07-07 MED ORDER — ACETAMINOPHEN 10 MG/ML IV SOLN
INTRAVENOUS | Status: DC | PRN
  Administered 2015-07-07: 1000 mg via INTRAVENOUS

## 2015-07-07 MED ORDER — ROCURONIUM BROMIDE 100 MG/10ML IV SOLN
INTRAVENOUS | Status: DC | PRN
  Administered 2015-07-07: 30 mg via INTRAVENOUS
  Administered 2015-07-07 (×2): 10 mg via INTRAVENOUS

## 2015-07-07 SURGICAL SUPPLY — 40 items
APPLIER CLIP 5 13 M/L LIGAMAX5 (MISCELLANEOUS) ×3
APPLIER CLIP LOGIC TI 5 (MISCELLANEOUS) ×3 IMPLANT
BACTOSHIELD CHG 4% 4OZ (MISCELLANEOUS) ×2
BAG DECANTER STRL (MISCELLANEOUS) ×3 IMPLANT
BLADE CLIPPER SURG (BLADE) ×3 IMPLANT
BLADE SURG SZ11 CARB STEEL (BLADE) ×3 IMPLANT
BRUSH SCRUB 4% CHG (MISCELLANEOUS) ×3 IMPLANT
CANISTER SUCT 3000ML PPV (MISCELLANEOUS) ×3 IMPLANT
CHLORAPREP W/TINT 26ML (MISCELLANEOUS) ×3 IMPLANT
CHOLANGIOGRAM CATH TAUT (CATHETERS) IMPLANT
CLEANER CAUTERY TIP 5X5 PAD (MISCELLANEOUS) IMPLANT
CLIP APPLIE 5 13 M/L LIGAMAX5 (MISCELLANEOUS) ×1 IMPLANT
DEVICE TROCAR PUNCTURE CLOSURE (ENDOMECHANICALS) ×3 IMPLANT
DRAPE C-ARM XRAY 36X54 (DRAPES) ×3 IMPLANT
ELECT REM PT RETURN 9FT ADLT (ELECTROSURGICAL) ×3
ELECTRODE REM PT RTRN 9FT ADLT (ELECTROSURGICAL) ×1 IMPLANT
ENDOPOUCH RETRIEVER 10 (MISCELLANEOUS) ×3 IMPLANT
GLOVE BIO SURGEON STRL SZ7 (GLOVE) ×3 IMPLANT
GOWN STRL REUS W/ TWL LRG LVL3 (GOWN DISPOSABLE) ×2 IMPLANT
GOWN STRL REUS W/TWL LRG LVL3 (GOWN DISPOSABLE) ×4
IRRIGATION STRYKERFLOW (MISCELLANEOUS) ×1 IMPLANT
IRRIGATOR STRYKERFLOW (MISCELLANEOUS) ×3
IV NS 100ML SINGLE PACK (IV SOLUTION) ×3 IMPLANT
LIQUID BAND (GAUZE/BANDAGES/DRESSINGS) ×6 IMPLANT
MARKER SKIN DUAL TIP RULER LAB (MISCELLANEOUS) ×3 IMPLANT
PACK LAP CHOLECYSTECTOMY (MISCELLANEOUS) ×3 IMPLANT
PAD CLEANER CAUTERY TIP 5X5 (MISCELLANEOUS)
PENCIL ELECTRO HAND CTR (MISCELLANEOUS) ×3 IMPLANT
POUCH ENDO CATCH 10MM SPEC (MISCELLANEOUS) ×3 IMPLANT
SCISSORS METZENBAUM CVD 33 (INSTRUMENTS) ×3 IMPLANT
SCRUB CHG 4% DYNA-HEX 4OZ (MISCELLANEOUS) ×1 IMPLANT
STOPCOCK 3 WAY  REPLAC (MISCELLANEOUS) IMPLANT
SUT ETHIBOND NAB MO 7 #0 18IN (SUTURE) ×3 IMPLANT
SUT MNCRL AB 4-0 PS2 18 (SUTURE) ×3 IMPLANT
SUT VIC AB 0 CT1 36 (SUTURE) ×3 IMPLANT
SUT VICRYL 0 AB UR-6 (SUTURE) ×6 IMPLANT
SYR 20CC LL (SYRINGE) ×6 IMPLANT
TROCAR BALLN 12MMX100 BLUNT (TROCAR) ×3 IMPLANT
TROCAR Z-THREAD OPTICAL 5X100M (TROCAR) ×9 IMPLANT
WATER STERILE IRR 1000ML POUR (IV SOLUTION) ×3 IMPLANT

## 2015-07-07 NOTE — Anesthesia Preprocedure Evaluation (Addendum)
Anesthesia Evaluation  Patient identified by MRN, date of birth, ID band Patient awake    Reviewed: Allergy & Precautions, NPO status , Patient's Chart, lab work & pertinent test results  History of Anesthesia Complications Negative for: history of anesthetic complications  Airway Mallampati: II       Dental  (+) Teeth Intact   Pulmonary neg pulmonary ROS,           Cardiovascular hypertension, Pt. on medications      Neuro/Psych Anxiety negative neurological ROS     GI/Hepatic Neg liver ROS, GERD  Medicated,  Endo/Other  Hypothyroidism Borderline  Renal/GU negative Renal ROS     Musculoskeletal   Abdominal   Peds  Hematology  (+) anemia ,   Anesthesia Other Findings   Reproductive/Obstetrics                            Anesthesia Physical Anesthesia Plan  ASA: II  Anesthesia Plan: General   Post-op Pain Management:    Induction: Intravenous  Airway Management Planned:   Additional Equipment:   Intra-op Plan:   Post-operative Plan:   Informed Consent: I have reviewed the patients History and Physical, chart, labs and discussed the procedure including the risks, benefits and alternatives for the proposed anesthesia with the patient or authorized representative who has indicated his/her understanding and acceptance.     Plan Discussed with:   Anesthesia Plan Comments:         Anesthesia Quick Evaluation

## 2015-07-07 NOTE — Op Note (Addendum)
07/06/2015 - 07/07/2015  5:58 PM  PATIENT:  Diamond Hinton  60 y.o. female  PRE-OPERATIVE DIAGNOSIS:  Cholecystitis  POST-OPERATIVE DIAGNOSIS:  Cholecystitis  PROCEDURE:  Procedure(s): LAPAROSCOPIC CHOLECYSTECTOMY  (N/A)  Laparoscopic lysis of adhesions taking 30 minutes operative time  SURGEON:  Surgeon(s) and Role:    * Nayleen Janosik F Dyon Rotert, MD - Primary  FINDINGS: Thick adhesions from GB to omentum and GB to duodenum                    Hydrops c/w Acute Calculus Cholecystitis PHYSICIAN ASSISTANT:N/A   ASSISTANTS: none   ANESTHESIA:   general  EBL:  Total I/O In: 1586 [I.V.:1586] Out: 850 [Urine:800; Blood:50]  BLOOD ADMINISTERED:none  DRAINS: none   LOCAL MEDICATIONS USED:  MARCAINE     SPECIMEN:  Source of Specimen:  GB  DISPOSITION OF SPECIMEN:  PATHOLOGY  COUNTS:  YES  TOURNIQUET:  * No tourniquets in log *  DICTATION: See below  PLAN OF CARE: Admit for overnight observation  PATIENT DISPOSITION:  PACU - hemodynamically stable.   Delay start of Pharmacological VTE agent (>24hrs) due to surgical blood loss or risk of bleeding: not applicable  The patient was taken to the OR and placed in a supine position. After induction of GETA she was prepped and draped in the usual fashion. Time out performed and appropriate antibiotics were given.  Supraumbilical incision created and fascia was divided between kocher clamps. Peritoneal cavity entered under direct visualization and stay sutures placed. Hasson trochar inserted and pneumoperitoneum obtained. Three 5 mm trocars placed on the RUQ. Fundus Retracted superiorly. Evidence of hydrops and this was evacuated with an aspiration needle. Significant adhesions from the duodenum to the GB and from the omentum to the GB were lysed using a combination of scissors and cautery. Once we had a good window we dissected the cystic artery and duct. After the window of safety was visualized the cystic duct and artery were double clipped and  divided. The Gallbladder was removed from its fossa with electrocautery. Cautery was used to cauterize the liver bed  That was raw from the inflammatory response. No evidence of active bleeding or bile . GB was removed via the endocacth bag and liver bed irrigated.  Good hemostasis visualized. All ports were removed and the umbilical fascia closed with interrupted vicryl sutures. Marcaine was injected on all incisions and the skin was closed with 4-0 monocryl. Dermabond was used to coat the skin incisions. Needle and laparotomy counts were correct. No immediate complications

## 2015-07-07 NOTE — Anesthesia Postprocedure Evaluation (Signed)
Anesthesia Post Note  Patient: Diamond Hinton  Procedure(s) Performed: Procedure(s) (LRB): LAPAROSCOPIC CHOLECYSTECTOMY  (N/A)  Patient location during evaluation: PACU Anesthesia Type: General Level of consciousness: awake and alert Pain management: pain level controlled Vital Signs Assessment: post-procedure vital signs reviewed and stable Respiratory status: spontaneous breathing and respiratory function stable Cardiovascular status: stable Anesthetic complications: no    Last Vitals:  Filed Vitals:   07/07/15 1916 07/07/15 2021  BP: 125/71 135/68  Pulse: 76 73  Temp: 37.2 C 36.9 C  Resp: 17 20    Last Pain:  Filed Vitals:   07/07/15 2021  PainSc: 0-No pain                 KEPHART,WILLIAM K

## 2015-07-07 NOTE — Transfer of Care (Signed)
Immediate Anesthesia Transfer of Care Note  Patient: Diamond Hinton  Procedure(s) Performed: Procedure(s): LAPAROSCOPIC CHOLECYSTECTOMY  (N/A)  Patient Location: PACU  Anesthesia Type:General  Level of Consciousness: awake, alert  and oriented  Airway & Oxygen Therapy: Patient Spontanous Breathing and Patient connected to face mask oxygen  Post-op Assessment: Report given to RN and Post -op Vital signs reviewed and stable  Post vital signs: Reviewed and stable  Last Vitals:  Filed Vitals:   07/07/15 1534 07/07/15 1729  BP: 140/70 138/67  Pulse: 91   Temp: 38.7 C 37.6 C  Resp: 18 18    Complications: No apparent anesthesia complications

## 2015-07-07 NOTE — Progress Notes (Signed)
CC: Cholecystitis. Subjective: Patient medical overnight with a diagnosis of cholecystitis. She reports feeling somewhat better since admission but continues to have right upper quadrant pain.  Objective: Vital signs in last 24 hours: Temp:  [98 F (36.7 C)-99.9 F (37.7 C)] 99.9 F (37.7 C) (01/25 0433) Pulse Rate:  [73-100] 79 (01/25 0433) Resp:  [16-18] 17 (01/25 0433) BP: (134-177)/(72-99) 134/72 mmHg (01/25 0433) SpO2:  [93 %-100 %] 93 % (01/25 0433) Weight:  [68.04 kg (150 lb)-71.26 kg (157 lb 1.6 oz)] 71.26 kg (157 lb 1.6 oz) (01/24 2115) Last BM Date: 07/06/15  Intake/Output from previous day: 01/24 0701 - 01/25 0700 In: 964 [I.V.:964] Out: 350 [Urine:350] Intake/Output this shift: Total I/O In: 170 [I.V.:170] Out: 200 [Urine:200]  Physical exam:  Gen.: No acute distress Chest: Clear to auscultation Heart: Regular rate and rhythm Abdomen: Soft, tender to palpation right upper quadrant, nondistended.  Lab Results: CBC   Recent Labs  07/06/15 1524 07/07/15 0353  WBC 13.0* 17.0*  HGB 14.5 13.6  HCT 43.6 40.1  PLT 244 227   BMET  Recent Labs  07/06/15 1524 07/07/15 0353  NA 138 133*  K 3.6 3.5  CL 104 100*  CO2 25 26  GLUCOSE 129* 167*  BUN 10 8  CREATININE 0.84 0.84  CALCIUM 10.1 8.5*   PT/INR No results for input(s): LABPROT, INR in the last 72 hours. ABG No results for input(s): PHART, HCO3 in the last 72 hours.  Invalid input(s): PCO2, PO2  Studies/Results: US Abdomen Limited Ruq  07/06/2015  CLINICAL DATA:  Patient with right upper quadrant pain.  Nausea. EXAM: US ABDOMEN LIMITED - RIGHT UPPER QUADRANT COMPARISON:  None. FINDINGS: Gallbladder: Multiple mobile echogenic stones are demonstrated within the gallbladder lumen. No gallbladder wall thickening. No pericholecystic fluid. Negative sonographic Murphy sign. Common bile duct: Diameter: 4 mm Liver: No focal lesion identified. Within normal limits in parenchymal echogenicity. IMPRESSION:  Cholelithiasis without sonographic evidence for acute cholecystitis. Electronically Signed   By: Lovey Newcomer M.D.   On: 07/06/2015 16:33    Anti-infectives: Anti-infectives    Start     Dose/Rate Route Frequency Ordered Stop   07/07/15 1100  cefOXitin (MEFOXIN) 1 g in dextrose 5 % 50 mL IVPB     1 g 100 mL/hr over 30 Minutes Intravenous 4 times per day 07/07/15 0716     07/06/15 2030  ceFAZolin (ANCEF) IVPB 1 g/50 mL premix  Status:  Discontinued     1 g 100 mL/hr over 30 Minutes Intravenous 4 times per day 07/06/15 2018 07/07/15 0716      Assessment/Plan:  60 year old female admitted for cholecystitis. Plan to take the operative today for laparoscopic cholecystectomy with either myself or Dr. Dahlia Byes.  Alexandrea Westergard T. Adonis Huguenin, MD, FACS  07/07/2015

## 2015-07-07 NOTE — Care Management (Signed)
Patient admitted with acute cholecystitis.  Patient lives at home with her husband.  Patient is stay at home home, and is uninsured.  Patient PCP at [facility].  Patient states that she sees the PCP twice a year and pays out of pocket for visits.  Patient states that she has already spoken with someone in the financial department at [facility].  Patient has declined application for [facility].  No RNCM needs identified.

## 2015-07-07 NOTE — Anesthesia Procedure Notes (Signed)
Procedure Name: Intubation Date/Time: 07/07/2015 3:55 PM Performed by: Delaney Meigs Pre-anesthesia Checklist: Patient identified, Emergency Drugs available, Suction available, Patient being monitored and Timeout performed Patient Re-evaluated:Patient Re-evaluated prior to inductionOxygen Delivery Method: Circle system utilized Preoxygenation: Pre-oxygenation with 100% oxygen Intubation Type: IV induction Ventilation: Mask ventilation without difficulty Laryngoscope Size: Mac and 3 Grade View: Grade III Tube type: Oral Tube size: 7.0 mm Number of attempts: 1 Airway Equipment and Method: Stylet Placement Confirmation: ETT inserted through vocal cords under direct vision,  positive ETCO2 and breath sounds checked- equal and bilateral Secured at: 18 cm Tube secured with: Tape Dental Injury: Teeth and Oropharynx as per pre-operative assessment

## 2015-07-08 ENCOUNTER — Encounter: Payer: Self-pay | Admitting: Surgery

## 2015-07-08 LAB — CBC
HEMATOCRIT: 37.4 % (ref 35.0–47.0)
Hemoglobin: 12.4 g/dL (ref 12.0–16.0)
MCH: 28.4 pg (ref 26.0–34.0)
MCHC: 33.2 g/dL (ref 32.0–36.0)
MCV: 85.5 fL (ref 80.0–100.0)
Platelets: 175 10*3/uL (ref 150–440)
RBC: 4.37 MIL/uL (ref 3.80–5.20)
RDW: 13.3 % (ref 11.5–14.5)
WBC: 16.3 10*3/uL — ABNORMAL HIGH (ref 3.6–11.0)

## 2015-07-08 MED ORDER — OXYCODONE-ACETAMINOPHEN 10-325 MG PO TABS: 1.0000 | ORAL_TABLET | ORAL | Status: DC | PRN

## 2015-07-08 NOTE — Progress Notes (Signed)
Patient A/O, no noted distress. Patient ambulated to the bathroom. Encouraged patient to walked, noted she did not want to at the time. Continue ABT treatment. No pain. Staff will continue to monitor and meet needs. Patient slept well, no fall risk.

## 2015-07-08 NOTE — Discharge Summary (Signed)
Physician Discharge Summary   Patient ID: Diamond Hinton [id number] 60 y.o. 03/21/1956  Admit date: 07/06/2015  Discharge date and time: No discharge date for patient encounter.   Admitting Physician: Florene Glen, MD   Discharge Physician: [facility]  Admission Diagnoses: RUQ pain [R10.11]  Discharge Diagnoses: Acute cholecystitis  Admission Condition: Stablr  Discharged Condition: stable Indication for Admission: Acute cholecystitis  Hospital Course: Admitted for RUQ pain and fevers c/w Cholecystitis confirmed by U/S. Underwent an uneventful lap chole with enterolysis. Her fever effervesced an at the time of discharge she was ambulating, tolerating regular diet, afebrile, VSS, her abdomen was soft, NT, incisions were healing well w/o infection.     Significant Diagnostic Studies:   Treatments:   Discharge Exam: See above  Disposition: Final discharge disposition not confirmed  Patient Instructions:    Medication List    STOP taking these medications        acetaminophen 500 MG tablet  Commonly known as:  TYLENOL      TAKE these medications        hydrochlorothiazide 25 MG tablet  Commonly known as:  HYDRODIURIL  Take 25 mg by mouth daily as needed (for increased BP).     levothyroxine 75 MCG tablet  Commonly known as:  SYNTHROID  Take 1 tablet (75 mcg total) by mouth daily.     NON FORMULARY  Take 1 capsule by mouth daily. Pt takes Chanca.     oxyCODONE-acetaminophen 10-325 MG tablet  Commonly known as:  PERCOCET  Take 1 tablet by mouth every 4 (four) hours as needed for pain.     ranitidine 150 MG tablet  Commonly known as:  ZANTAC  Take 150 mg by mouth daily as needed for heartburn.       Activity: No heavy lifting , less 20 lbs for 6 weeks Diet: low fat, low cholesterol diet Wound Care: as directed  Follow-up with 1-2 weeks  in [facility]  Signed: [place] 07/08/2015 9:05 AM

## 2015-07-08 NOTE — Progress Notes (Signed)
Pt stable. IV removed. D/c instructions given and education provided. Signed prescriptions verified and given. Pt states she understands instructions. Pt dressed and escorted out by staff. Driven home by family.  

## 2015-07-09 ENCOUNTER — Telehealth: Payer: Self-pay

## 2015-07-09 LAB — SURGICAL PATHOLOGY

## 2015-07-09 NOTE — Telephone Encounter (Signed)
Post discharge call to patient made at this time. Pain is controlled at this time. Denies nausea, vomiting, and fever. Patient is experiencing some diarrhea since surgery. This led to some questions that she had about diet post-cholecystectomy. Spoke with patient in great length about this and when her diarrhea would subside.  Confirmed patient appointment scheduled. Encouraged patient to call with any questions that arise prior to appointment.

## 2015-07-22 ENCOUNTER — Encounter: Payer: Self-pay | Admitting: Surgery

## 2015-07-22 ENCOUNTER — Ambulatory Visit (INDEPENDENT_AMBULATORY_CARE_PROVIDER_SITE_OTHER): Payer: Self-pay | Admitting: Surgery

## 2015-07-22 VITALS — BP 162/88 | HR 80 | Temp 98.0°F | Ht 63.0 in | Wt 145.6 lb

## 2015-07-22 DIAGNOSIS — K810 Acute cholecystitis: Secondary | ICD-10-CM

## 2015-07-22 DIAGNOSIS — N764 Abscess of vulva: Secondary | ICD-10-CM | POA: Insufficient documentation

## 2015-07-22 NOTE — Patient Instructions (Signed)

## 2015-07-22 NOTE — Progress Notes (Signed)
60 year old female status post laparoscopic cholecystectomy for acute cholecystitis. Patient states she is doing well she is learning what she can and cannot eat. She states that she does have diarrhea with certain things but otherwise is doing well. Patient states that she took some Tylenol for pain for about 5 days.  Patient states that initially when she got home she had a slight rash over her face and neck but she took Benadryl and that quickly resolved.  Filed Vitals:   07/22/15 0921  BP: 162/88  Pulse: 80  Temp: 98 F (36.7 C)   PE:  Gen: NAD Abd: soft, non-tender, incisions c/d/i without any erythema or signs of infection  Ext: 2+ pulses, no edema  A/P: Doing well status post laparoscopic cholecystectomy. I discussed her pathology of Cholecystitis with cholelithiasis but no malignancy seen.  She can call with any questions or concerns.

## 2015-11-13 ENCOUNTER — Encounter: Payer: Self-pay | Admitting: Family Medicine

## 2015-11-13 ENCOUNTER — Ambulatory Visit (INDEPENDENT_AMBULATORY_CARE_PROVIDER_SITE_OTHER): Payer: Self-pay | Admitting: Family Medicine

## 2015-11-13 VITALS — BP 138/80 | HR 82 | Temp 97.7°F | Resp 16 | Wt 152.2 lb

## 2015-11-13 DIAGNOSIS — J069 Acute upper respiratory infection, unspecified: Secondary | ICD-10-CM

## 2015-11-13 NOTE — Progress Notes (Signed)
Subjective:     Patient ID: Diamond Hinton, female   DOB: 25-Aug-1955, 60 y.o.   MRN: [id number]  HPI  Chief Complaint  Patient presents with  . Cough    Patient reports on Monday moring she woke up with a sour taste in her mouth and had noticed white spots on the side of her tongue, as the week progressed on patient developed dry cough and sinus drainage. Patient states that when she drinks or eats it taste like metal on her mouth, she also complains of tenderness on left side of neck and left ear being itchy. Patient has taken otc Chlorospetic spray and Aleve D.   Has tried multiple over the counter medication and is starting to feel better. Feels she may have overused chloraseptic.Has left over hydrocodone at home which she did not use after cholecystectomy. Suggested its use as a cough suppressant.   Review of Systems     Objective:   Physical Exam  Constitutional: She appears well-developed and well-nourished. No distress.  Ears: T.M's intact without inflammation Throat: no tonsillar enlargement or exudate Neck: no cervical adenopathy Lungs: clear     Assessment:    1. Upper respiratory infection    Plan:    Discussed continued symptomatic treatment.

## 2015-11-13 NOTE — Patient Instructions (Signed)
Continue decongestants and saline nasal spray. Continue Delsym for cough.

## 2015-11-30 ENCOUNTER — Encounter: Payer: Self-pay | Admitting: Family Medicine

## 2015-11-30 ENCOUNTER — Ambulatory Visit (INDEPENDENT_AMBULATORY_CARE_PROVIDER_SITE_OTHER): Payer: Self-pay | Admitting: Family Medicine

## 2015-11-30 VITALS — BP 140/82 | HR 72 | Temp 98.6°F | Resp 16 | Ht 63.0 in | Wt 155.0 lb

## 2015-11-30 DIAGNOSIS — K146 Glossodynia: Secondary | ICD-10-CM

## 2015-11-30 DIAGNOSIS — E039 Hypothyroidism, unspecified: Secondary | ICD-10-CM

## 2015-11-30 DIAGNOSIS — I10 Essential (primary) hypertension: Secondary | ICD-10-CM

## 2015-11-30 DIAGNOSIS — R202 Paresthesia of skin: Secondary | ICD-10-CM

## 2015-11-30 DIAGNOSIS — E7800 Pure hypercholesterolemia, unspecified: Secondary | ICD-10-CM

## 2015-11-30 DIAGNOSIS — R7303 Prediabetes: Secondary | ICD-10-CM

## 2015-11-30 MED ORDER — HYDROCHLOROTHIAZIDE 12.5 MG PO TABS: 12.5000 mg | ORAL_TABLET | Freq: Every day | ORAL | Status: DC

## 2015-11-30 NOTE — Progress Notes (Signed)
Patient: Diamond Hinton Female    DOB: 07-06-1955   60 y.o.   MRN: [id number] Visit Date: 11/30/2015  Today's Provider: Margarita Rana, MD   Chief Complaint  Patient presents with  . Hypertension  . Hypothyroidism  . Hyperglycemia  . URI   Subjective:    HPI  Hypertension, follow-up:  BP Readings from Last 3 Encounters:  11/30/15 140/82  11/13/15 138/80  07/22/15 162/88    She was last seen for hypertension 9 months ago.  BP at that visit was 138/80. Management changes since that visit include no changes. She reports fair compliance with treatment. Patient reports that she only takes HCTZ as needed.  She is having side effects. tired  She is not exercising. She is not adherent to low salt diet.   Outside blood pressures are stable. She is experiencing fatigue.  Patient denies chest pain.   Cardiovascular risk factors include none.  Use of agents associated with hypertension: none.     Weight trend: stable Wt Readings from Last 3 Encounters:  11/30/15 155 lb (70.308 kg)  11/13/15 152 lb 3.2 oz (69.037 kg)  07/22/15 145 lb 9.6 oz (66.044 kg)    Current diet: in general, a "healthy" diet    ------------------------------------------------------------------------    Hypothyroid, follow-up:  TSH  Date Value Ref Range Status  02/22/2015 4.250 0.450 - 4.500 uIU/mL Final  08/19/2014 2.58 .41 - 5.90 uIU/mL Final   Wt Readings from Last 3 Encounters:  11/30/15 155 lb (70.308 kg)  11/13/15 152 lb 3.2 oz (69.037 kg)  07/22/15 145 lb 9.6 oz (66.044 kg)    She was last seen for hypothyroid 9 months ago.  Management since that visit includes no changes. She reports excellent compliance with treatment. She is having side effects.  She is not exercising. She is experiencing change in energy level She denies change in energy level Weight trend: stable  ------------------------------------------------------------------------   Prediabetes, Follow-up:     Lab Results  Component Value Date   HGBA1C 6.0* 02/22/2015   HGBA1C 6.0 08/19/2014   GLUCOSE 167* 07/07/2015   GLUCOSE 129* 07/06/2015   GLUCOSE 115* 02/22/2015    Last seen for for this 9  months ago.  Management since that visit includes labs checked. Current symptoms include none and have been stable.  Weight trend: stable Prior visit with dietician: no Current diet: in general, a "healthy" diet   Current exercise: walking  Pertinent Labs:    Component Value Date/Time   CHOL 174 02/22/2015 0933   CHOL 225* 08/18/2013   TRIG 172* 02/22/2015 0933   CHOLHDL 5.3* 02/22/2015 0933   CREATININE 0.84 07/07/2015 0353   CREATININE 0.8 09/24/2014   ---------------------------------------------------------------------------------------------------------------------  Upper Respiratory Infection: Patient complains of symptoms of a URI. Symptoms include left ear pain, dry cough. Onset of symptoms was 3 weeks ago, mild improvement since that time.  She is drinking plenty of fluids. Evaluation to date: 11/13/2015 Mikki Santee) seen previously and thought to have a viral URI. Treatment to date: none. Patient reports that she has been taking Claritin daily. Patient reports that her cough is worse when she talks and in the morning. Patient reports that she has "burning in her mouth". Patient reports that left side of tongue and mouth are the only places that burn. Patient reports that burning comes and goes, but less noticeable some days. Patient has not been able to identify trigger. Patient reports that she did have a very stressful month in  May. Patient reports that she has taken anti fungal tea.       Allergies  Allergen Reactions  . Peanuts [Peanut Oil] Shortness Of Breath and Itching   Current Meds  Medication Sig  . hydrochlorothiazide (HYDRODIURIL) 25 MG tablet Take 25 mg by mouth daily.  Marland Kitchen levothyroxine (SYNTHROID) 75 MCG tablet Take 1 tablet (75 mcg total) by mouth daily.    Review  of Systems  Constitutional: Positive for activity change and appetite change.  HENT: Positive for congestion, ear pain and sore throat.   Respiratory: Positive for cough.   Cardiovascular: Negative.   Endocrine: Negative.     Social History  Substance Use Topics  . Smoking status: Never Smoker   . Smokeless tobacco: Never Used  . Alcohol Use: No   Objective:   BP 140/82 mmHg  Pulse 72  Temp(Src) 98.6 F (37 C) (Oral)  Resp 16  Ht 5\' 3"  (1.6 m)  Wt 155 lb (70.308 kg)  BMI 27.46 kg/m2  SpO2 98%  Physical Exam  Constitutional: She is oriented to person, place, and time. She appears well-developed and well-nourished.  HENT:  Head: Normocephalic and atraumatic.  Right Ear: External ear normal.  Left Ear: External ear normal.  Nose: Nose normal.  Mouth/Throat: Oropharynx is clear and moist.  Cardiovascular: Normal rate, regular rhythm and normal heart sounds.   Pulmonary/Chest: Effort normal and breath sounds normal.  Neurological: She is alert and oriented to person, place, and time.        Assessment & Plan:     1. Benign essential HTN Stable. Patient advised to decrease hydrochlorothiazide to 12.5 mg as below. F/U pending lab report. - CBC with Differential/Platelet - Comprehensive metabolic panel - hydrochlorothiazide (HYDRODIURIL) 12.5 MG tablet; Take 1 tablet (12.5 mg total) by mouth daily.  Dispense: 30 tablet; Refill: 3  2. Hypothyroidism, unspecified hypothyroidism type F/U pending lab report. - TSH  3. Borderline diabetes F/U pending lab report. - Hemoglobin A1c  4. Hypercholesteremia F/U pending lab report. - Lipid Panel With LDL/HDL Ratio  5. Burning mouth syndrome New problem. F/U pending lab report. Patient declined magic mouth wash today. Patient advised to call if symptoms are not improving or worsening. Will call in [place] if needed.    6. Paresthesia F/U pending lab report. - Vitamin B12    Patient seen and examined by Dr. Jerrell Belfast, and note scribed by Philbert Riser. Dimas, CMA.  I have reviewed the document for accuracy and completeness and I agree with above. - Jerrell Belfast, MD   Margarita Rana, MD  [facility]

## 2015-12-01 ENCOUNTER — Other Ambulatory Visit: Payer: Self-pay | Admitting: Family Medicine

## 2015-12-01 DIAGNOSIS — Z0000 Encounter for general adult medical examination without abnormal findings: Secondary | ICD-10-CM

## 2015-12-02 ENCOUNTER — Telehealth: Payer: Self-pay

## 2015-12-02 DIAGNOSIS — E039 Hypothyroidism, unspecified: Secondary | ICD-10-CM

## 2015-12-02 LAB — COMPREHENSIVE METABOLIC PANEL
ALBUMIN: 4.7 g/dL (ref 3.6–4.8)
ALT: 25 IU/L (ref 0–32)
AST: 22 IU/L (ref 0–40)
Albumin/Globulin Ratio: 1.6 (ref 1.2–2.2)
Alkaline Phosphatase: 61 IU/L (ref 39–117)
BUN / CREAT RATIO: 21 (ref 12–28)
BUN: 15 mg/dL (ref 8–27)
Bilirubin Total: 0.5 mg/dL (ref 0.0–1.2)
CO2: 25 mmol/L (ref 18–29)
CREATININE: 0.7 mg/dL (ref 0.57–1.00)
Calcium: 10.1 mg/dL (ref 8.7–10.3)
Chloride: 97 mmol/L (ref 96–106)
GFR calc non Af Amer: 94 mL/min/{1.73_m2} (ref >59)
GFR, EST AFRICAN AMERICAN: 108 mL/min/{1.73_m2} (ref >59)
GLOBULIN, TOTAL: 3 g/dL (ref 1.5–4.5)
GLUCOSE: 113 mg/dL — AB (ref 65–99)
Potassium: 4.3 mmol/L (ref 3.5–5.2)
SODIUM: 144 mmol/L (ref 134–144)
TOTAL PROTEIN: 7.7 g/dL (ref 6.0–8.5)

## 2015-12-02 LAB — CBC WITH DIFFERENTIAL/PLATELET
BASOS: 0 %
Basophils Absolute: 0 10*3/uL (ref 0.0–0.2)
EOS (ABSOLUTE): 0.1 10*3/uL (ref 0.0–0.4)
Eos: 1 %
HEMOGLOBIN: 15.4 g/dL (ref 11.1–15.9)
Hematocrit: 43.6 % (ref 34.0–46.6)
IMMATURE GRANS (ABS): 0 10*3/uL (ref 0.0–0.1)
IMMATURE GRANULOCYTES: 0 %
LYMPHS: 30 %
Lymphocytes Absolute: 2.4 10*3/uL (ref 0.7–3.1)
MCH: 30.1 pg (ref 26.6–33.0)
MCHC: 35.3 g/dL (ref 31.5–35.7)
MCV: 85 fL (ref 79–97)
MONOCYTES: 6 %
Monocytes Absolute: 0.4 10*3/uL (ref 0.1–0.9)
NEUTROS ABS: 5 10*3/uL (ref 1.4–7.0)
NEUTROS PCT: 63 %
PLATELETS: 258 10*3/uL (ref 150–379)
RBC: 5.12 x10E6/uL (ref 3.77–5.28)
RDW: 13.2 % (ref 12.3–15.4)
WBC: 8 10*3/uL (ref 3.4–10.8)

## 2015-12-02 LAB — HEMOGLOBIN A1C
Est. average glucose Bld gHb Est-mCnc: 126 mg/dL
Hgb A1c MFr Bld: 6 % — ABNORMAL HIGH (ref 4.8–5.6)

## 2015-12-02 LAB — LIPID PANEL WITH LDL/HDL RATIO
CHOLESTEROL TOTAL: 182 mg/dL (ref 100–199)
HDL: 39 mg/dL — AB (ref >39)
LDL CALC: 109 mg/dL — AB (ref 0–99)
LDl/HDL Ratio: 2.8 ratio units (ref 0.0–3.2)
Triglycerides: 169 mg/dL — ABNORMAL HIGH (ref 0–149)
VLDL CHOLESTEROL CAL: 34 mg/dL (ref 5–40)

## 2015-12-02 LAB — TSH: TSH: 4.03 u[IU]/mL (ref 0.450–4.500)

## 2015-12-02 LAB — SPECIMEN STATUS REPORT

## 2015-12-02 LAB — VITAMIN B12: Vitamin B-12: 553 pg/mL (ref 211–946)

## 2015-12-02 NOTE — Telephone Encounter (Signed)
-----   Message from Margarita Rana, MD sent at 12/02/2015  2:12 PM EDT ----- Second note. Labs stable. Blood sugar just above normal, make sure to eat healthy and exercise and recheck labs every 6 months.  Thanks.

## 2015-12-02 NOTE — Telephone Encounter (Signed)
LMTCB 12/02/2015  Thanks,   -Mickel Baas

## 2015-12-03 MED ORDER — LEVOTHYROXINE SODIUM 75 MCG PO TABS: 75.0000 ug | ORAL_TABLET | Freq: Every day | ORAL | Status: DC

## 2015-12-03 NOTE — Telephone Encounter (Signed)
Pt informed in person due top phones being down. Pt will need a refill on her Levothyroxine 75 mcg. Thanks.   [facility].

## 2016-05-31 ENCOUNTER — Other Ambulatory Visit: Payer: Self-pay | Admitting: Family Medicine

## 2016-05-31 ENCOUNTER — Encounter: Payer: Self-pay | Admitting: Family Medicine

## 2016-05-31 ENCOUNTER — Ambulatory Visit (INDEPENDENT_AMBULATORY_CARE_PROVIDER_SITE_OTHER): Payer: Self-pay | Admitting: Family Medicine

## 2016-05-31 VITALS — BP 112/64 | HR 64 | Temp 98.1°F | Resp 16 | Wt 157.6 lb

## 2016-05-31 DIAGNOSIS — Z0000 Encounter for general adult medical examination without abnormal findings: Secondary | ICD-10-CM

## 2016-05-31 DIAGNOSIS — I10 Essential (primary) hypertension: Secondary | ICD-10-CM

## 2016-05-31 DIAGNOSIS — R739 Hyperglycemia, unspecified: Secondary | ICD-10-CM

## 2016-05-31 DIAGNOSIS — E039 Hypothyroidism, unspecified: Secondary | ICD-10-CM

## 2016-05-31 MED ORDER — LEVOTHYROXINE SODIUM 75 MCG PO TABS: 75.0000 ug | ORAL_TABLET | Freq: Every day | ORAL | 1 refills | Status: DC

## 2016-05-31 NOTE — Progress Notes (Signed)
Subjective:     Patient ID: Diamond Hinton, female   DOB: [birth year], 60 y.o.   MRN: [id number]  HPI  Chief Complaint  Patient presents with  . Hypertension    Patient is present in office today for 6 month follow up, last office visit was 11/30/15 patients HCTZ was decreased to 12.5mg  daily. Patient reports fair compliance on medication, she states that she was under alot of stress in October and was alternating between 25mg  and 12.5mg .   . Hypothyroidism    6 month follow up, TSH las ordered 11/30/15.  . Diabetes    6 month follow up from 11/30/15 HgbA1C in house at visit was 6.0%. Patient reports poor eating habits and is not actively exercising.  Marland Kitchen Hyperlipidemia    6 month follow up, Lipid panel las ordered on 11/30/15. Patient reports that she has discussed with Dr. Venia Minks in past about being placed on statin and states that she refuses to be on medication and feels no need to check Lipid panel.   Calculated 10 year risk for c.v disease is 4.8% based on prior cholesterol panel. 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   Review of Systems  Constitutional:       Reports that she will occasionally have early awakening and not be able to get to sleep. Will attempt sleep inducing activities like reading or ironing with little improvement.  Respiratory: Negative for shortness of breath.   Cardiovascular: Negative for chest pain and palpitations.       Objective:   Physical Exam  Constitutional: She appears well-developed and well-nourished. No distress.  Cardiovascular: Normal rate and regular rhythm.   Pulmonary/Chest: Breath sounds normal.  Musculoskeletal: She exhibits no edema (of lower extremities).       Assessment:    1. Benign essential HTN: will stay consistently with HCTZ 12.5 mg. - Renal function panel  2. Hyperglycemia - HgB A1c  3. Adult hypothyroidism - levothyroxine (SYNTHROID) 75 MCG tablet; Take 1 tablet (75 mcg total) by mouth daily.   Dispense: 90 tablet; Refill: 1    Plan:    Encouraged walking 30 minutes daily. Further f/u pending lab work.

## 2016-05-31 NOTE — Patient Instructions (Signed)
Encouraged walking program 30 minutes daily. 1.Keep the same bedtime every night 2.Use your bed for sleep and sex only 3. Avoid exercise or stimulating electronic activity (video games/smart phones) prior to bedtime 4.Avoid alcohol or caffeine 2-3 hours before bedtime 5.Take a bath/shower prior to bedtime. This will help cool your body down and prepare it for sleep. 6.If you can't sleep, get up and do a sleep inducing activity like reading,

## 2016-06-01 ENCOUNTER — Telehealth: Payer: Self-pay

## 2016-06-01 LAB — RENAL FUNCTION PANEL
Albumin: 5 g/dL — ABNORMAL HIGH (ref 3.6–4.8)
BUN/Creatinine Ratio: 22 (ref 12–28)
BUN: 18 mg/dL (ref 8–27)
CO2: 28 mmol/L (ref 18–29)
Calcium: 9.9 mg/dL (ref 8.7–10.3)
Chloride: 100 mmol/L (ref 96–106)
Creatinine, Ser: 0.81 mg/dL (ref 0.57–1.00)
GFR calc Af Amer: 90 mL/min/1.73
GFR calc non Af Amer: 78 mL/min/1.73
Glucose: 116 mg/dL — ABNORMAL HIGH (ref 65–99)
Phosphorus: 3.6 mg/dL (ref 2.5–4.5)
Potassium: 4.9 mmol/L (ref 3.5–5.2)
Sodium: 143 mmol/L (ref 134–144)

## 2016-06-01 LAB — HGB A1C W/O EAG: Hgb A1c MFr Bld: 5.8 % — ABNORMAL HIGH (ref 4.8–5.6)

## 2016-06-01 NOTE — Telephone Encounter (Signed)
-----   Message from [place], [place] sent at 06/01/2016  7:22 AM EST ----- Mild elevation of glucose but A1C is lower than previously. Remainder of labs including potassium are ok.

## 2016-06-01 NOTE — Telephone Encounter (Signed)
Patient advised as below.  

## 2016-06-30 ENCOUNTER — Other Ambulatory Visit: Payer: Self-pay | Admitting: Family Medicine

## 2016-06-30 DIAGNOSIS — I10 Essential (primary) hypertension: Secondary | ICD-10-CM

## 2016-06-30 NOTE — Telephone Encounter (Signed)
Please review-aa 

## 2016-10-26 ENCOUNTER — Other Ambulatory Visit: Payer: Self-pay | Admitting: Family Medicine

## 2016-10-26 DIAGNOSIS — E039 Hypothyroidism, unspecified: Secondary | ICD-10-CM

## 2016-11-29 ENCOUNTER — Ambulatory Visit (INDEPENDENT_AMBULATORY_CARE_PROVIDER_SITE_OTHER): Payer: Self-pay | Admitting: Family Medicine

## 2016-11-29 ENCOUNTER — Encounter: Payer: Self-pay | Admitting: Family Medicine

## 2016-11-29 VITALS — BP 120/76 | HR 77 | Temp 98.5°F | Resp 16 | Wt 160.4 lb

## 2016-11-29 DIAGNOSIS — Z0000 Encounter for general adult medical examination without abnormal findings: Secondary | ICD-10-CM

## 2016-11-29 DIAGNOSIS — I10 Essential (primary) hypertension: Secondary | ICD-10-CM

## 2016-11-29 DIAGNOSIS — E039 Hypothyroidism, unspecified: Secondary | ICD-10-CM

## 2016-11-29 NOTE — Patient Instructions (Signed)
We will call you with the lab results. Encourage walking 30 minutes daily.

## 2016-11-29 NOTE — Progress Notes (Signed)
Subjective:     Patient ID: Diamond Hinton, female   DOB: 1955-12-27, 61 y.o.   MRN: [id number]  HPI  Chief Complaint  Patient presents with  . Hypothyroidism    Patient returns to office today for 6 month follow up, last office visit was 05/31/16 patient was advised to continue Levothyroxine 80mcg. Patient reports good compliance and tolerance on drug  . Hypertension    Patient returns for 6 month follow up last office visit was 05/31/16, patients blood pressure at last visit was 112/64 she was instructed to continue HCTZ 12.5mg . Patient reports that blood pressure readings outside the office have been normal and has had good compliance and tolerance on medication.  . Hyperglycemia    Patient returns for 6 month follow up, last office visit was 05/31/16, at visit HgbA1C was 5.8%. Patient reports poor diet and exercise since last office visit.   States a family member was just placed in Hospice for end stage liver disease. She states she has been focusing on this instead of exercise. Feels well otherwise. Denies diabetic sx.   Review of Systems  Respiratory: Negative for shortness of breath.   Cardiovascular: Negative for chest pain and palpitations.       Objective:   Physical Exam  Constitutional: She appears well-developed and well-nourished. No distress.  Cardiovascular: Normal rate and regular rhythm.   Pulmonary/Chest: Breath sounds normal.  Musculoskeletal: She exhibits no edema (of lower extremities).       Assessment:    1. Adult hypothyroidism - TSH  2. Benign essential HTN: continue current medication    Plan:    Further f/u pending lab results. Encourage 30 minutes of walking daily.

## 2016-11-30 ENCOUNTER — Other Ambulatory Visit: Payer: Self-pay | Admitting: Family Medicine

## 2016-11-30 ENCOUNTER — Telehealth: Payer: Self-pay

## 2016-11-30 DIAGNOSIS — E039 Hypothyroidism, unspecified: Secondary | ICD-10-CM

## 2016-11-30 DIAGNOSIS — I10 Essential (primary) hypertension: Secondary | ICD-10-CM

## 2016-11-30 LAB — TSH: TSH: 4.47 u[IU]/mL (ref 0.450–4.500)

## 2016-11-30 MED ORDER — LEVOTHYROXINE SODIUM 75 MCG PO TABS: ORAL_TABLET | ORAL | 3 refills | Status: DC

## 2016-11-30 MED ORDER — HYDROCHLOROTHIAZIDE 12.5 MG PO TABS: ORAL_TABLET | ORAL | 1 refills | Status: DC

## 2016-11-30 NOTE — Telephone Encounter (Signed)
-----   Message from Carmon Ginsberg, [place] sent at 11/30/2016  7:31 AM EDT ----- Thyroid ok

## 2016-11-30 NOTE — Telephone Encounter (Signed)
Medication refilled

## 2016-11-30 NOTE — Telephone Encounter (Signed)
Patient was advised of lab report she request refills on Levothyroxine and HCTZ sent to [facility]. KW

## 2017-05-29 ENCOUNTER — Ambulatory Visit: Payer: Self-pay | Admitting: Family Medicine

## 2017-08-28 ENCOUNTER — Ambulatory Visit (INDEPENDENT_AMBULATORY_CARE_PROVIDER_SITE_OTHER): Payer: Self-pay | Admitting: Family Medicine

## 2017-08-28 ENCOUNTER — Encounter: Payer: Self-pay | Admitting: Family Medicine

## 2017-08-28 VITALS — BP 112/84 | HR 66 | Temp 98.5°F | Resp 16

## 2017-08-28 DIAGNOSIS — E039 Hypothyroidism, unspecified: Secondary | ICD-10-CM

## 2017-08-28 DIAGNOSIS — R739 Hyperglycemia, unspecified: Secondary | ICD-10-CM

## 2017-08-28 DIAGNOSIS — I10 Essential (primary) hypertension: Secondary | ICD-10-CM

## 2017-08-28 DIAGNOSIS — E7800 Pure hypercholesterolemia, unspecified: Secondary | ICD-10-CM

## 2017-08-28 MED ORDER — HYDROCHLOROTHIAZIDE 12.5 MG PO TABS: ORAL_TABLET | ORAL | 1 refills | Status: DC

## 2017-08-28 NOTE — Progress Notes (Signed)
Subjective:     Patient ID: Diamond Hinton, female   DOB: 06-17-1955, 62 y.o.   MRN: [id number] Chief Complaint  Patient presents with  . Hypertension    Patient returns to office for follow up visit, patient was last seen 11/29/16 blood pressure in house was 120/76. Patient states that blood pressure readings outside of office has been elevated, she does report good compliance and tolerance on medication.  . Hypothyroidism    Follow up from 11/29/16.    HPI States she has not been exercising or being careful with her diet.States she likes to mall walk from 30-45 minutes. Does not wish to update mammogram or pap smear at this time (no insurance).  Review of Systems  Respiratory: Negative for shortness of breath.   Cardiovascular: Negative for chest pain and palpitations.  Psychiatric/Behavioral: Positive for dysphoric mood (weather related).       Objective:   Physical Exam  Constitutional: She appears well-developed and well-nourished. No distress.  Cardiovascular: Normal rate and regular rhythm.  Pulmonary/Chest: Breath sounds normal.  Musculoskeletal: She exhibits no edema (of lower extremities).       Assessment:    1. Benign essential HTN - Comprehensive metabolic panel - hydrochlorothiazide (HYDRODIURIL) 12.5 MG tablet; TAKE ONE (1) TABLET EACH DAY  Dispense: 90 tablet; Refill: 1  2. Adult hypothyroidism - TSH - T4, free  3. Hypercholesteremia - Lipid panel  4. Hyperglycemia - Hemoglobin A1c    Plan:    Further f/u pending lab work.

## 2017-08-28 NOTE — Patient Instructions (Signed)
We will call you with the lab results. Consider updating mammograms and pap smear.

## 2017-08-29 LAB — COMPREHENSIVE METABOLIC PANEL
ALK PHOS: 53 IU/L (ref 39–117)
ALT: 38 IU/L — ABNORMAL HIGH (ref 0–32)
AST: 26 IU/L (ref 0–40)
Albumin/Globulin Ratio: 2 (ref 1.2–2.2)
Albumin: 4.6 g/dL (ref 3.6–4.8)
BUN/Creatinine Ratio: 21 (ref 12–28)
BUN: 18 mg/dL (ref 8–27)
Bilirubin Total: 0.4 mg/dL (ref 0.0–1.2)
CO2: 24 mmol/L (ref 20–29)
Calcium: 9.6 mg/dL (ref 8.7–10.3)
Chloride: 102 mmol/L (ref 96–106)
Creatinine, Ser: 0.84 mg/dL (ref 0.57–1.00)
GFR calc Af Amer: 86 mL/min/{1.73_m2} (ref >59)
GFR calc non Af Amer: 74 mL/min/{1.73_m2} (ref >59)
GLUCOSE: 120 mg/dL — AB (ref 65–99)
Globulin, Total: 2.3 g/dL (ref 1.5–4.5)
Potassium: 4.2 mmol/L (ref 3.5–5.2)
SODIUM: 141 mmol/L (ref 134–144)
Total Protein: 6.9 g/dL (ref 6.0–8.5)

## 2017-08-29 LAB — HEMOGLOBIN A1C
Est. average glucose Bld gHb Est-mCnc: 120 mg/dL
Hgb A1c MFr Bld: 5.8 % — ABNORMAL HIGH (ref 4.8–5.6)

## 2017-08-29 LAB — LIPID PANEL
CHOLESTEROL TOTAL: 209 mg/dL — AB (ref 100–199)
Chol/HDL Ratio: 5.5 ratio — ABNORMAL HIGH (ref 0.0–4.4)
HDL: 38 mg/dL — ABNORMAL LOW (ref >39)
LDL CALC: 144 mg/dL — AB (ref 0–99)
TRIGLYCERIDES: 134 mg/dL (ref 0–149)
VLDL CHOLESTEROL CAL: 27 mg/dL (ref 5–40)

## 2017-08-29 LAB — TSH: TSH: 3.72 u[IU]/mL (ref 0.450–4.500)

## 2017-08-29 LAB — T4, FREE: Free T4: 1.19 ng/dL (ref 0.82–1.77)

## 2017-11-20 ENCOUNTER — Other Ambulatory Visit: Payer: Self-pay | Admitting: Family Medicine

## 2017-11-20 DIAGNOSIS — E039 Hypothyroidism, unspecified: Secondary | ICD-10-CM

## 2018-02-18 ENCOUNTER — Other Ambulatory Visit: Payer: Self-pay | Admitting: Family Medicine

## 2018-02-18 DIAGNOSIS — I10 Essential (primary) hypertension: Secondary | ICD-10-CM

## 2018-10-14 ENCOUNTER — Other Ambulatory Visit: Payer: Self-pay | Admitting: Family Medicine

## 2018-10-14 DIAGNOSIS — I10 Essential (primary) hypertension: Secondary | ICD-10-CM

## 2018-10-14 DIAGNOSIS — E039 Hypothyroidism, unspecified: Secondary | ICD-10-CM

## 2018-10-14 NOTE — Telephone Encounter (Signed)
[facility] faxed refill request for the following medications:  levothyroxine (SYNTHROID, LEVOTHROID) 75 MCG tablet   hydrochlorothiazide (HYDRODIURIL) 12.5 MG tablet   Please advise.

## 2018-10-16 MED ORDER — HYDROCHLOROTHIAZIDE 12.5 MG PO TABS: ORAL_TABLET | ORAL | 0 refills | Status: DC

## 2018-10-16 MED ORDER — LEVOTHYROXINE SODIUM 75 MCG PO TABS: ORAL_TABLET | ORAL | 0 refills | Status: DC

## 2018-10-16 NOTE — Telephone Encounter (Signed)
Patient advised. 3 month follow up scheduled.  

## 2018-10-16 NOTE — Telephone Encounter (Signed)
Patient hasn't followed up in >1 year. Please set evisit.  If she needs refills, we can give 1 month supply to hold her over

## 2018-10-16 NOTE — Telephone Encounter (Signed)
Patient advised. She states she does not have insurance at this time. She is requesting a 90 day supply and states she will have insurance within 90 days. Please advise.

## 2018-10-16 NOTE — Telephone Encounter (Signed)
OK 90 day supply sent. Maybe we can go ahead and set 3 month f/u appt. Thanks

## 2019-01-15 ENCOUNTER — Other Ambulatory Visit: Payer: Self-pay

## 2019-01-15 ENCOUNTER — Encounter: Payer: Self-pay | Admitting: Family Medicine

## 2019-01-15 ENCOUNTER — Ambulatory Visit (INDEPENDENT_AMBULATORY_CARE_PROVIDER_SITE_OTHER): Payer: Medicare Other | Admitting: Family Medicine

## 2019-01-15 VITALS — BP 150/90 | HR 75 | Temp 97.5°F | Ht 63.0 in | Wt 165.2 lb

## 2019-01-15 DIAGNOSIS — E663 Overweight: Secondary | ICD-10-CM | POA: Insufficient documentation

## 2019-01-15 DIAGNOSIS — E7800 Pure hypercholesterolemia, unspecified: Secondary | ICD-10-CM | POA: Diagnosis not present

## 2019-01-15 DIAGNOSIS — R7303 Prediabetes: Secondary | ICD-10-CM | POA: Diagnosis not present

## 2019-01-15 DIAGNOSIS — I10 Essential (primary) hypertension: Secondary | ICD-10-CM

## 2019-01-15 DIAGNOSIS — E039 Hypothyroidism, unspecified: Secondary | ICD-10-CM

## 2019-01-15 MED ORDER — HYDROCHLOROTHIAZIDE 12.5 MG PO TABS: ORAL_TABLET | ORAL | 1 refills | Status: DC

## 2019-01-15 NOTE — Assessment & Plan Note (Signed)
Discussed importance of healthy weight management Discussed diet and exercise  

## 2019-01-15 NOTE — Progress Notes (Signed)
Patient: Diamond Hinton Female    DOB: [birth year]   63 y.o.   MRN: [id number] Visit Date: 01/15/2019  Today's Provider: Lavon Paganini, MD   Chief Complaint  Patient presents with  . Hypertension  . Hypothyroidism   Subjective:    I, Tiburcio Pea, CMA, am acting as a Education administrator for Lavon Paganini, MD.    HPI  Hypertension, follow-up:  BP Readings from Last 3 Encounters:  01/15/19 (!) 150/90  08/28/17 112/84  11/29/16 120/76    She was last seen for hypertension over 1 years ago.  BP at that visit was 112/84 Management changes since that visit include no changes. She reports good compliance with treatment. She is not having side effects.  She is not exercising. She is not adherent to low salt diet.   Outside blood pressures are being checked periodically. She is experiencing none.  Patient denies chest pain, chest pressure/discomfort, claudication, dyspnea, exertional chest pressure/discomfort, fatigue, irregular heart beat, lower extremity edema, near-syncope, orthopnea, palpitations, paroxysmal nocturnal dyspnea, syncope and tachypnea.   Cardiovascular risk factors include advanced age (older than 51 for men, 22 for women) and hypertension.  Use of agents associated with hypertension: thyroid hormones.     Weight trend: stable Wt Readings from Last 3 Encounters:  01/15/19 165 lb 3.2 oz (74.9 kg)  11/29/16 160 lb 6.4 oz (72.8 kg)  05/31/16 157 lb 9.6 oz (71.5 kg)    Current diet: in general, an "unhealthy" diet  ------------------------------------------------------------------------  Hypothyroid, follow-up:  TSH  Date Value Ref Range Status  08/28/2017 3.720 0.450 - 4.500 uIU/mL Final  11/29/2016 4.470 0.450 - 4.500 uIU/mL Final  12/01/2015 4.030 0.450 - 4.500 uIU/mL Final   Wt Readings from Last 3 Encounters:  01/15/19 165 lb 3.2 oz (74.9 kg)  11/29/16 160 lb 6.4 oz (72.8 kg)  05/31/16 157 lb 9.6 oz (71.5 kg)    She was last seen for  hypothyroid over 1 years ago.  Management since that visit includes no changes. She reports good compliance with treatment. She is not having side effects.  She is not exercising. She is experiencing change in energy level and heat / cold intolerance She denies diarrhea, nervousness, palpitations and weight changes Weight trend: stable  ------------------------------------------------------------------------  Allergies  Allergen Reactions  . Peanuts [Peanut Oil] Shortness Of Breath and Itching     Current Outpatient Medications:  .  cholecalciferol (VITAMIN D) 1000 units tablet, Take 1,000 Units by mouth daily., Disp: , Rfl:  .  hydrochlorothiazide (HYDRODIURIL) 12.5 MG tablet, TAKE 1 TABLET BY MOUTH ONCE DAILY, Disp: 90 tablet, Rfl: 1 .  levothyroxine (SYNTHROID) 75 MCG tablet, TAKE 1 TABLET BY MOUTH ONCE DAILY, Disp: 90 tablet, Rfl: 0 .  loratadine (CLARITIN) 10 MG tablet, Take 10 mg by mouth daily., Disp: , Rfl:  .  Probiotic Product (PROBIOTIC DAILY PO), Take by mouth., Disp: , Rfl:   Review of Systems  Constitutional: Negative.   Respiratory: Negative.   Cardiovascular: Negative.   Endocrine: Negative.   Musculoskeletal: Negative.     Social History   Tobacco Use  . Smoking status: Never Smoker  . Smokeless tobacco: Never Used  Substance Use Topics  . Alcohol use: No      Objective:   BP (!) 150/90 (BP Location: Left Arm, Patient Position: Sitting, Cuff Size: Normal)   Pulse 75   Temp (!) 97.5 F (36.4 C) (Oral)   Ht 5\' 3"  (1.6 m)   Wt 165 lb 3.2  oz (74.9 kg)   SpO2 97%   BMI 29.26 kg/m  Vitals:   01/15/19 0947  BP: (!) 150/90  Pulse: 75  Temp: (!) 97.5 F (36.4 C)  TempSrc: Oral  SpO2: 97%  Weight: 165 lb 3.2 oz (74.9 kg)  Height: 5\' 3"  (1.6 m)     Physical Exam Vitals signs reviewed.  Constitutional:      General: She is not in acute distress.    Appearance: Normal appearance. She is well-developed. She is not diaphoretic.  HENT:     Head:  Normocephalic and atraumatic.     Right Ear: Tympanic membrane, ear canal and external ear normal.     Left Ear: Tympanic membrane, ear canal and external ear normal.  Eyes:     General: No scleral icterus.    Conjunctiva/sclera: Conjunctivae normal.  Neck:     Musculoskeletal: Neck supple.     Thyroid: No thyromegaly.  Cardiovascular:     Rate and Rhythm: Normal rate and regular rhythm.     Pulses: Normal pulses.     Heart sounds: Normal heart sounds. No murmur.  Pulmonary:     Effort: Pulmonary effort is normal. No respiratory distress.     Breath sounds: Normal breath sounds. No wheezing, rhonchi or rales.  Abdominal:     General: There is no distension.     Palpations: Abdomen is soft.     Tenderness: There is no abdominal tenderness.  Musculoskeletal:     Right lower leg: No edema.     Left lower leg: No edema.  Lymphadenopathy:     Cervical: No cervical adenopathy.  Skin:    General: Skin is warm and dry.     Capillary Refill: Capillary refill takes less than 2 seconds.     Findings: No rash.  Neurological:     Mental Status: She is alert and oriented to person, place, and time. Mental status is at baseline.  Psychiatric:        Mood and Affect: Mood normal.        Behavior: Behavior normal.      No results found for any visits on 01/15/19.     Assessment & Plan   Patient declines any screenings, AWV, CPE  Problem List Items Addressed This Visit      Cardiovascular and Mediastinum   Benign essential HTN - Primary    Uncontrolled Advised increasing HCTZ to 25mg  daily but patient declined She prefers to do lifestyle interventions and supplements Continue HCTZ 12.5mg  daily Check Metabolic panel F/u in 2 months      Relevant Medications   hydrochlorothiazide (HYDRODIURIL) 12.5 MG tablet   Other Relevant Orders   Comprehensive metabolic panel     Endocrine   Adult hypothyroidism    Previously well controlled Recheck TSH Continue current Synthroid  dose, pending labs F/u in 6 months      Relevant Orders   TSH     Other   Hypercholesteremia    Not currently on a statin  Recheck FLP and CMP Calculate ASCVD risk      Relevant Medications   hydrochlorothiazide (HYDRODIURIL) 12.5 MG tablet   Other Relevant Orders   Lipid panel   Comprehensive metabolic panel   Prediabetes    Discussed low carb diet and exercise Recheck A1c      Relevant Orders   Hemoglobin A1c   Overweight    Discussed importance of healthy weight management Discussed diet and exercise       Relevant  Orders   Lipid panel   Comprehensive metabolic panel   TSH   Hemoglobin A1c       Return in about 6 months (around 07/18/2019) for chronic disease f/u.   The entirety of the information documented in the History of Present Illness, Review of Systems and Physical Exam were personally obtained by me. Portions of this information were initially documented by Tiburcio Pea, CMA and reviewed by me for thoroughness and accuracy.    Jaysion Ramseyer, Dionne Bucy, MD MPH [facility]

## 2019-01-15 NOTE — Assessment & Plan Note (Signed)
Not currently on a statin  Recheck FLP and CMP Calculate ASCVD risk

## 2019-01-15 NOTE — Assessment & Plan Note (Addendum)
Previously well controlled Recheck TSH Continue current Synthroid dose, pending labs F/u in 6 months

## 2019-01-15 NOTE — Assessment & Plan Note (Addendum)
Uncontrolled Advised increasing HCTZ to 25mg  daily but patient declined She prefers to do lifestyle interventions and supplements Continue HCTZ 12.5mg  daily Check Metabolic panel F/u in 2 months

## 2019-01-15 NOTE — Assessment & Plan Note (Signed)
Discussed low carb diet and exercise Recheck A1c

## 2019-01-16 ENCOUNTER — Other Ambulatory Visit: Payer: Self-pay

## 2019-01-16 DIAGNOSIS — I10 Essential (primary) hypertension: Secondary | ICD-10-CM

## 2019-01-16 DIAGNOSIS — E039 Hypothyroidism, unspecified: Secondary | ICD-10-CM

## 2019-01-16 LAB — COMPREHENSIVE METABOLIC PANEL
ALT: 45 IU/L — ABNORMAL HIGH (ref 0–32)
AST: 28 IU/L (ref 0–40)
Albumin/Globulin Ratio: 2.2 (ref 1.2–2.2)
Albumin: 4.8 g/dL (ref 3.8–4.8)
Alkaline Phosphatase: 60 IU/L (ref 39–117)
BUN/Creatinine Ratio: 15 (ref 12–28)
BUN: 13 mg/dL (ref 8–27)
Bilirubin Total: 0.4 mg/dL (ref 0.0–1.2)
CO2: 26 mmol/L (ref 20–29)
Calcium: 10.2 mg/dL (ref 8.7–10.3)
Chloride: 99 mmol/L (ref 96–106)
Creatinine, Ser: 0.85 mg/dL (ref 0.57–1.00)
GFR calc Af Amer: 83 mL/min/{1.73_m2} (ref >59)
GFR calc non Af Amer: 72 mL/min/{1.73_m2} (ref >59)
Globulin, Total: 2.2 g/dL (ref 1.5–4.5)
Glucose: 118 mg/dL — ABNORMAL HIGH (ref 65–99)
Potassium: 4.1 mmol/L (ref 3.5–5.2)
Sodium: 143 mmol/L (ref 134–144)
Total Protein: 7 g/dL (ref 6.0–8.5)

## 2019-01-16 LAB — HEMOGLOBIN A1C
Est. average glucose Bld gHb Est-mCnc: 128 mg/dL
Hgb A1c MFr Bld: 6.1 % — ABNORMAL HIGH (ref 4.8–5.6)

## 2019-01-16 LAB — LIPID PANEL
Chol/HDL Ratio: 5.3 ratio — ABNORMAL HIGH (ref 0.0–4.4)
Cholesterol, Total: 234 mg/dL — ABNORMAL HIGH (ref 100–199)
HDL: 44 mg/dL (ref >39)
LDL Calculated: 150 mg/dL — ABNORMAL HIGH (ref 0–99)
Triglycerides: 200 mg/dL — ABNORMAL HIGH (ref 0–149)
VLDL Cholesterol Cal: 40 mg/dL (ref 5–40)

## 2019-01-16 LAB — TSH: TSH: 4.33 u[IU]/mL (ref 0.450–4.500)

## 2019-01-16 MED ORDER — LEVOTHYROXINE SODIUM 88 MCG PO TABS: ORAL_TABLET | ORAL | 0 refills | Status: DC

## 2019-01-16 MED ORDER — HYDROCHLOROTHIAZIDE 25 MG PO TABS: ORAL_TABLET | ORAL | 1 refills | Status: DC

## 2019-01-16 NOTE — Telephone Encounter (Signed)
Patient was advised and agrees with the increase dose of HCTZ & Levothyroxine.

## 2019-01-16 NOTE — Telephone Encounter (Signed)
That is a high normal TSH, so she may feel slightly better if we increased her levothyroxine dose.  If she like to go ahead with this, we can increase it to 88 mcg daily and we can send in a 90-day supply.  If we do increase it, I would recommend rechecking the level in 2 to 3 months.  I have discussed with her that it would be good to increase her HCTZ dose to 25 mg daily, but she had declined that yesterday.  If she is agreeable to it at this time, we can send a new prescription in for her.

## 2019-01-16 NOTE — Telephone Encounter (Signed)
Patient was advised. Patient wants to know if her levothyroxine could be increased? She states that she know it in normal range but think it is considered a high normal. Also if you want her to double up on her HCTZ? Please advise.

## 2019-01-16 NOTE — Telephone Encounter (Signed)
-----   Message from Virginia Crews, MD sent at 01/16/2019  1:33 PM EDT ----- Stable labs. Prediabetic and high cholesterol. recommend diet low in saturated fat and carbs and regular exercise - 30 min at least 5 times per week

## 2019-03-17 ENCOUNTER — Encounter: Payer: Self-pay | Admitting: Family Medicine

## 2019-03-17 ENCOUNTER — Other Ambulatory Visit: Payer: Self-pay

## 2019-03-17 ENCOUNTER — Ambulatory Visit (INDEPENDENT_AMBULATORY_CARE_PROVIDER_SITE_OTHER): Payer: Medicare Other | Admitting: Family Medicine

## 2019-03-17 VITALS — BP 129/81 | HR 77 | Temp 96.9°F | Wt 159.8 lb

## 2019-03-17 DIAGNOSIS — I10 Essential (primary) hypertension: Secondary | ICD-10-CM

## 2019-03-17 DIAGNOSIS — E663 Overweight: Secondary | ICD-10-CM | POA: Diagnosis not present

## 2019-03-17 DIAGNOSIS — E039 Hypothyroidism, unspecified: Secondary | ICD-10-CM | POA: Diagnosis not present

## 2019-03-17 MED ORDER — LEVOTHYROXINE SODIUM 88 MCG PO TABS: ORAL_TABLET | ORAL | 3 refills | Status: DC

## 2019-03-17 MED ORDER — HYDROCHLOROTHIAZIDE 25 MG PO TABS: ORAL_TABLET | ORAL | 3 refills | Status: DC

## 2019-03-17 NOTE — Patient Instructions (Signed)

## 2019-03-17 NOTE — Assessment & Plan Note (Signed)
Discussed importance of healthy weight management Discussed diet and exercise  

## 2019-03-17 NOTE — Assessment & Plan Note (Signed)
Well controlled Continue current medications Recheck metabolic panel F/u in 6 months  

## 2019-03-17 NOTE — Assessment & Plan Note (Signed)
Previously well controlled Feels better at lower end of normal Continue Synthroid Recheck TSH F/u in 6 months

## 2019-03-17 NOTE — Progress Notes (Signed)
Patient: Diamond Hinton Female    DOB: 25-Sep-1955   63 y.o.   MRN: [id number] Visit Date: 03/17/2019  Today's Provider: Lavon Paganini, MD   Chief Complaint  Patient presents with  . Hypertension   Subjective:    I, Tiburcio Pea, CMA, am acting as a Education administrator for Lavon Paganini, MD.    HPI  Hypertension, follow-up:     BP Readings from Last 3 Encounters:  01/15/19 (!) 150/90  08/28/17 112/84  11/29/16 120/76    She was last seen for hypertension 2 months ago.  BP at that visit was 150/90 Management changes since that visit include increase HCTZ to 25 mg and lifestyle changes. She reports good compliance with treatment. She is not having side effects.  She is exercising. She is not adherent to low salt diet.   Outside blood pressures are being checked periodically. She is experiencing none.  Patient denies chest pain, chest pressure/discomfort, claudication, dyspnea, exertional chest pressure/discomfort, fatigue, irregular heart beat, lower extremity edema, near-syncope, orthopnea, palpitations, paroxysmal nocturnal dyspnea, syncope and tachypnea.   Cardiovascular risk factors include advanced age (older than 65 for men, 51 for women) and hypertension.  Use of agents associated with hypertension: thyroid hormones.                Weight trend: stable    Wt Readings from Last 3 Encounters:  01/15/19 165 lb 3.2 oz (74.9 kg)  11/29/16 160 lb 6.4 oz (72.8 kg)  05/31/16 157 lb 9.6 oz (71.5 kg)    Current diet: weight watchers   Allergies  Allergen Reactions  . Peanuts [Peanut Oil] Shortness Of Breath and Itching     Current Outpatient Medications:  .  cholecalciferol (VITAMIN D) 1000 units tablet, Take 1,000 Units by mouth daily., Disp: , Rfl:  .  hydrochlorothiazide (HYDRODIURIL) 25 MG tablet, TAKE 1 TABLET BY MOUTH ONCE DAILY, Disp: 30 tablet, Rfl: 1 .  levothyroxine (SYNTHROID) 88 MCG tablet, TAKE 1 TABLET BY MOUTH ONCE DAILY, Disp: 90 tablet,  Rfl: 0 .  loratadine (CLARITIN) 10 MG tablet, Take 10 mg by mouth daily., Disp: , Rfl:  .  Probiotic Product (PROBIOTIC DAILY PO), Take by mouth., Disp: , Rfl:   Review of Systems  Constitutional: Negative.   Respiratory: Negative.   Cardiovascular: Negative.   Musculoskeletal: Negative.     Social History   Tobacco Use  . Smoking status: Never Smoker  . Smokeless tobacco: Never Used  Substance Use Topics  . Alcohol use: No      Objective:   BP 129/81 (BP Location: Left Arm, Patient Position: Sitting, Cuff Size: Normal)   Pulse 77   Temp (!) 96.9 F (36.1 C) (Temporal)   Wt 159 lb 12.8 oz (72.5 kg)   SpO2 96%   BMI 28.31 kg/m  Vitals:   03/17/19 0840  BP: 129/81  Pulse: 77  Temp: (!) 96.9 F (36.1 C)  TempSrc: Temporal  SpO2: 96%  Weight: 159 lb 12.8 oz (72.5 kg)  Body mass index is 28.31 kg/m.   Physical Exam Vitals signs reviewed.  Constitutional:      General: She is not in acute distress.    Appearance: Normal appearance. She is well-developed. She is not diaphoretic.  HENT:     Head: Normocephalic and atraumatic.  Eyes:     General: No scleral icterus.    Conjunctiva/sclera: Conjunctivae normal.  Neck:     Musculoskeletal: Neck supple.     Thyroid: No thyromegaly.  Cardiovascular:     Rate and Rhythm: Normal rate and regular rhythm.     Pulses: Normal pulses.     Heart sounds: Normal heart sounds. No murmur.  Pulmonary:     Effort: Pulmonary effort is normal. No respiratory distress.     Breath sounds: Normal breath sounds. No wheezing, rhonchi or rales.  Musculoskeletal:     Right lower leg: No edema.     Left lower leg: No edema.  Lymphadenopathy:     Cervical: No cervical adenopathy.  Skin:    General: Skin is warm and dry.     Capillary Refill: Capillary refill takes less than 2 seconds.     Findings: No rash.  Neurological:     Mental Status: She is alert and oriented to person, place, and time. Mental status is at baseline.   Psychiatric:        Mood and Affect: Mood normal.        Behavior: Behavior normal.      No results found for any visits on 03/17/19.     Assessment & Plan   Patient again declines any screenings, CPE, AWV  Problem List Items Addressed This Visit      Cardiovascular and Mediastinum   Benign essential HTN - Primary    Well controlled Continue current medications Recheck metabolic panel F/u in 6 months       Relevant Medications   hydrochlorothiazide (HYDRODIURIL) 25 MG tablet   Other Relevant Orders   Basic Metabolic Panel (BMET)     Endocrine   Adult hypothyroidism    Previously well controlled Feels better at lower end of normal Continue Synthroid Recheck TSH F/u in 6 months      Relevant Medications   levothyroxine (SYNTHROID) 88 MCG tablet   Other Relevant Orders   TSH     Other   Overweight    Discussed importance of healthy weight management Discussed diet and exercise           Return in about 6 months (around 09/15/2019) for chronic disease f/u.   The entirety of the information documented in the History of Present Illness, Review of Systems and Physical Exam were personally obtained by me. Portions of this information were initially documented by Tiburcio Pea, CMA and reviewed by me for thoroughness and accuracy.    Bacigalupo, Dionne Bucy, MD MPH [facility]

## 2019-03-18 LAB — BASIC METABOLIC PANEL
BUN/Creatinine Ratio: 21 (ref 12–28)
BUN: 18 mg/dL (ref 8–27)
CO2: 25 mmol/L (ref 20–29)
Calcium: 9.9 mg/dL (ref 8.7–10.3)
Chloride: 97 mmol/L (ref 96–106)
Creatinine, Ser: 0.85 mg/dL (ref 0.57–1.00)
GFR calc Af Amer: 83 mL/min/{1.73_m2} (ref >59)
GFR calc non Af Amer: 72 mL/min/{1.73_m2} (ref >59)
Glucose: 127 mg/dL — ABNORMAL HIGH (ref 65–99)
Potassium: 3.6 mmol/L (ref 3.5–5.2)
Sodium: 139 mmol/L (ref 134–144)

## 2019-03-18 LAB — TSH: TSH: 0.599 u[IU]/mL (ref 0.450–4.500)

## 2019-09-15 ENCOUNTER — Ambulatory Visit: Payer: Medicare Other | Admitting: Family Medicine

## 2020-03-26 ENCOUNTER — Other Ambulatory Visit: Payer: Self-pay | Admitting: Family Medicine

## 2020-03-26 DIAGNOSIS — I10 Essential (primary) hypertension: Secondary | ICD-10-CM

## 2020-03-26 MED ORDER — HYDROCHLOROTHIAZIDE 25 MG PO TABS: ORAL_TABLET | ORAL | 0 refills | Status: DC

## 2020-03-26 NOTE — Telephone Encounter (Signed)
Medication Refill - Medication: hydrochlorothiazide (HYDRODIURIL) 25 MG tablet  (Patient would like a supply of medication sent to pharmacy to last until upcoming appt. Patient is completely out.)   Has the patient contacted their pharmacy? Yes (Agent: If no, request that the patient contact the pharmacy for the refill.) (Agent: If yes, when and what did the pharmacy advise?)Contact PCP  Preferred Pharmacy (with phone number or street name):  [facility] Phone:  [phone]  Fax:  [phone]       Agent: Please be advised that RX refills may take up to 3 business days. We ask that you follow-up with your pharmacy.

## 2020-03-26 NOTE — Telephone Encounter (Signed)
Requested Prescriptions  Pending Prescriptions Disp Refills   hydrochlorothiazide (HYDRODIURIL) 25 MG tablet 30 tablet 0    Sig: TAKE 1 TABLET BY MOUTH ONCE DAILY     Cardiovascular: Diuretics - Thiazide Failed - 03/26/2020 11:29 AM      Failed - Ca in normal range and within 360 days    Calcium  Date Value Ref Range Status  03/17/2019 9.9 8.7 - 10.3 mg/dL Final         Failed - Cr in normal range and within 360 days    Creatinine, Ser  Date Value Ref Range Status  03/17/2019 0.85 0.57 - 1.00 mg/dL Final         Failed - K in normal range and within 360 days    Potassium  Date Value Ref Range Status  03/17/2019 3.6 3.5 - 5.2 mmol/L Final         Failed - Na in normal range and within 360 days    Sodium  Date Value Ref Range Status  03/17/2019 139 134 - 144 mmol/L Final         Failed - Valid encounter within last 6 months    Recent Outpatient Visits          1 year ago Benign essential HTN   [facility] [place], Dionne Bucy, MD   1 year ago Benign essential HTN   [facility] [place], Dionne Bucy, MD   2 years ago Benign essential HTN   [facility] [place], [place]   3 years ago Adult hypothyroidism   [place] [place], [place]   3 years ago Benign essential HTN   [facility] [place], [place]      Future Appointments            In 2 weeks Bacigalupo, Dionne Bucy, MD [facility]           Passed - Last BP in normal range    BP Readings from Last 1 Encounters:  03/17/19 129/81

## 2020-04-01 ENCOUNTER — Other Ambulatory Visit: Payer: Self-pay | Admitting: Family Medicine

## 2020-04-01 DIAGNOSIS — I10 Essential (primary) hypertension: Secondary | ICD-10-CM

## 2020-04-01 NOTE — Telephone Encounter (Signed)
Rx refill sent to pharmacy 6 days ago- verification not complete- will resend #30

## 2020-04-12 ENCOUNTER — Encounter: Payer: Self-pay | Admitting: Family Medicine

## 2020-04-12 ENCOUNTER — Ambulatory Visit (INDEPENDENT_AMBULATORY_CARE_PROVIDER_SITE_OTHER): Payer: Medicare Other | Admitting: Family Medicine

## 2020-04-12 ENCOUNTER — Other Ambulatory Visit: Payer: Self-pay

## 2020-04-12 VITALS — BP 131/80 | HR 78 | Temp 99.0°F | Resp 16 | Ht 62.5 in | Wt 161.2 lb

## 2020-04-12 DIAGNOSIS — E7800 Pure hypercholesterolemia, unspecified: Secondary | ICD-10-CM | POA: Diagnosis not present

## 2020-04-12 DIAGNOSIS — R7303 Prediabetes: Secondary | ICD-10-CM

## 2020-04-12 DIAGNOSIS — I10 Essential (primary) hypertension: Secondary | ICD-10-CM | POA: Diagnosis not present

## 2020-04-12 DIAGNOSIS — E663 Overweight: Secondary | ICD-10-CM

## 2020-04-12 DIAGNOSIS — E039 Hypothyroidism, unspecified: Secondary | ICD-10-CM

## 2020-04-12 NOTE — Assessment & Plan Note (Signed)
Discussed importance of healthy weight management Discussed diet and exercise  

## 2020-04-12 NOTE — Progress Notes (Signed)
Established patient visit   Patient: Diamond Hinton   DOB: [birth year]   64 y.o. Female  MRN: [id number] Visit Date: 04/12/2020  Today's healthcare provider: Lavon Paganini, MD   Chief Complaint  Patient presents with  . Hyperglycemia  . Hyperlipidemia  . Hypertension  . Hypothyroidism   Subjective    HPI  Hypertension, follow-up  BP Readings from Last 3 Encounters:  04/12/20 131/80  03/17/19 129/81  01/15/19 (!) 150/90   Wt Readings from Last 3 Encounters:  04/12/20 184 lb (83.5 kg)  03/17/19 159 lb 12.8 oz (72.5 kg)  01/15/19 165 lb 3.2 oz (74.9 kg)     She was last seen for hypertension 1 years ago.  BP at that visit was 129/81. Management since that visit includes no changes.  She reports excellent compliance with treatment. She is not having side effects.  She is following a Regular diet. She is not exercising. She does not smoke.  Use of agents associated with hypertension: none.   Outside blood pressures are not being checked. Symptoms: No chest pain No chest pressure  No palpitations No syncope  No dyspnea No orthopnea  No paroxysmal nocturnal dyspnea No lower extremity edema   Pertinent labs: Lab Results  Component Value Date   CHOL 234 (H) 01/15/2019   HDL 44 01/15/2019   LDLCALC 150 (H) 01/15/2019   TRIG 200 (H) 01/15/2019   CHOLHDL 5.3 (H) 01/15/2019   Lab Results  Component Value Date   NA 139 03/17/2019   K 3.6 03/17/2019   CREATININE 0.85 03/17/2019   GFRNONAA 72 03/17/2019   GFRAA 83 03/17/2019   GLUCOSE 127 (H) 03/17/2019     The 10-year ASCVD risk score Mikey Bussing DC Jr., et al., 2013) is: 10.1%   ---------------------------------------------------------------------------------------------------   Patient Active Problem List   Diagnosis Date Noted  . Overweight 01/15/2019  . Benign essential HTN 02/19/2015  . Hypercholesteremia 02/19/2015  . Adult hypothyroidism 02/19/2015  . Prediabetes 02/19/2015   Social History    Tobacco Use  . Smoking status: Never Smoker  . Smokeless tobacco: Never Used  Substance Use Topics  . Alcohol use: No  . Drug use: No   Allergies  Allergen Reactions  . Peanuts [Peanut Oil] Shortness Of Breath and Itching       Medications: Outpatient Medications Prior to Visit  Medication Sig  . cholecalciferol (VITAMIN D) 1000 units tablet Take 1,000 Units by mouth daily.  . hydrochlorothiazide (HYDRODIURIL) 25 MG tablet Take 1 tablet by mouth once daily  . levothyroxine (SYNTHROID) 88 MCG tablet TAKE 1 TABLET BY MOUTH ONCE DAILY  . loratadine (CLARITIN) 10 MG tablet Take 10 mg by mouth daily. (Patient not taking: Reported on 04/12/2020)  . Probiotic Product (PROBIOTIC DAILY PO) Take by mouth. (Patient not taking: Reported on 04/12/2020)   No facility-administered medications prior to visit.    Review of Systems  Constitutional: Negative for activity change, appetite change and fatigue.  Respiratory: Negative for cough, chest tightness, shortness of breath and wheezing.   Cardiovascular: Negative for chest pain, palpitations and leg swelling.      Objective    BP 131/80 (BP Location: Left Arm, Patient Position: Sitting, Cuff Size: Normal)   Pulse 78   Temp 99 F (37.2 C) (Oral)   Resp 16   Ht 5' 2.5" (1.588 m)   Wt 184 lb (83.5 kg)   SpO2 97%   BMI 33.12 kg/m  BP Readings from Last 3 Encounters:  04/12/20  131/80  03/17/19 129/81  01/15/19 (!) 150/90   Wt Readings from Last 3 Encounters:  04/12/20 184 lb (83.5 kg)  03/17/19 159 lb 12.8 oz (72.5 kg)  01/15/19 165 lb 3.2 oz (74.9 kg)      Physical Exam Vitals reviewed.  Constitutional:      General: She is not in acute distress.    Appearance: Normal appearance. She is well-developed. She is not diaphoretic.  HENT:     Head: Normocephalic and atraumatic.  Eyes:     General: No scleral icterus.    Conjunctiva/sclera: Conjunctivae normal.  Neck:     Thyroid: No thyromegaly.  Cardiovascular:     Rate  and Rhythm: Normal rate and regular rhythm.     Pulses: Normal pulses.     Heart sounds: Normal heart sounds. No murmur heard.   Pulmonary:     Effort: Pulmonary effort is normal. No respiratory distress.     Breath sounds: Normal breath sounds. No wheezing, rhonchi or rales.  Musculoskeletal:     Cervical back: Neck supple.     Right lower leg: No edema.     Left lower leg: No edema.  Lymphadenopathy:     Cervical: No cervical adenopathy.  Skin:    General: Skin is warm and dry.     Findings: No rash.  Neurological:     Mental Status: She is alert and oriented to person, place, and time. Mental status is at baseline.  Psychiatric:        Mood and Affect: Mood normal.        Behavior: Behavior normal.       No results found for any visits on 04/12/20.  Assessment & Plan     Problem List Items Addressed This Visit      Cardiovascular and Mediastinum   Benign essential HTN - Primary    Well controlled Continue current medications Recheck metabolic panel F/u in 6 months       Relevant Orders   Comprehensive metabolic panel     Endocrine   Adult hypothyroidism    Previously well controlled Continue Synthroid at current dose  Recheck TSH and adjust Synthroid as indicated        Relevant Orders   TSH     Other   Hypercholesteremia    Reviewed last lipid panel Not currently on a statin Recheck FLP and CMP Discussed diet and exercise       Relevant Orders   Comprehensive metabolic panel   Lipid Panel With LDL/HDL Ratio   Prediabetes    Encourage low carb diet Recheck A1c      Relevant Orders   Hemoglobin A1c   Overweight    Discussed importance of healthy weight management Discussed diet and exercise           Patient declines all screenings, AWV   Return in about 6 months (around 10/10/2020) for chronic disease f/u.      I, Lavon Paganini, MD, have reviewed all documentation for this visit. The documentation on 04/12/20 for the exam,  diagnosis, procedures, and orders are all accurate and complete.   Joslyne Marshburn, Dionne Bucy, MD, MPH [facility]

## 2020-04-12 NOTE — Assessment & Plan Note (Signed)
Reviewed last lipid panel Not currently on a statin Recheck FLP and CMP Discussed diet and exercise  

## 2020-04-12 NOTE — Patient Instructions (Signed)
Preventive Care 66 Years and Older, Female Preventive care refers to lifestyle choices and visits with your health care provider that can promote health and wellness. This includes:  A yearly physical exam. This is also called an annual well check.  Regular dental and eye exams.  Immunizations.  Screening for certain conditions.  Healthy lifestyle choices, such as diet and exercise. What can I expect for my preventive care visit? Physical exam Your health care provider will check:  Height and weight. These may be used to calculate body mass index (BMI), which is a measurement that tells if you are at a healthy weight.  Heart rate and blood pressure.  Your skin for abnormal spots. Counseling Your health care provider may ask you questions about:  Alcohol, tobacco, and drug use.  Emotional well-being.  Home and relationship well-being.  Sexual activity.  Eating habits.  History of falls.  Memory and ability to understand (cognition).  Work and work Statistician.  Pregnancy and menstrual history. What immunizations do I need?  Influenza (flu) vaccine  This is recommended every year. Tetanus, diphtheria, and pertussis (Tdap) vaccine  You may need a Td booster every 10 years. Varicella (chickenpox) vaccine  You may need this vaccine if you have not already been vaccinated. Zoster (shingles) vaccine  You may need this after age 66. Pneumococcal conjugate (PCV13) vaccine  One dose is recommended after age 66. Pneumococcal polysaccharide (PPSV23) vaccine  One dose is recommended after age 66. Measles, mumps, and rubella (MMR) vaccine  You may need at least one dose of MMR if you were born in 1957 or later. You may also need a second dose. Meningococcal conjugate (MenACWY) vaccine  You may need this if you have certain conditions. Hepatitis A vaccine  You may need this if you have certain conditions or if you travel or work in places where you may be exposed  to hepatitis A. Hepatitis B vaccine  You may need this if you have certain conditions or if you travel or work in places where you may be exposed to hepatitis B. Haemophilus influenzae type b (Hib) vaccine  You may need this if you have certain conditions. You may receive vaccines as individual doses or as more than one vaccine together in one shot (combination vaccines). Talk with your health care provider about the risks and benefits of combination vaccines. What tests do I need? Blood tests  Lipid and cholesterol levels. These may be checked every 5 years, or more frequently depending on your overall health.  Hepatitis C test.  Hepatitis B test. Screening  Lung cancer screening. You may have this screening every year starting at age 66 if you have a 30-pack-year history of smoking and currently smoke or have quit within the past 15 years.  Colorectal cancer screening. All adults should have this screening starting at age 66 and continuing until age 15. Your health care provider may recommend screening at age 66 if you are at increased risk. You will have tests every 1-10 years, depending on your results and the type of screening test.  Diabetes screening. This is done by checking your blood sugar (glucose) after you have not eaten for a while (fasting). You may have this done every 1-3 years.  Mammogram. This may be done every 1-2 years. Talk with your health care provider about how often you should have regular mammograms.  BRCA-related cancer screening. This may be done if you have a family history of breast, ovarian, tubal, or peritoneal cancers.  Other tests  Sexually transmitted disease (STD) testing.  Bone density scan. This is done to screen for osteoporosis. You may have this done starting at age 66. Follow these instructions at home: Eating and drinking  Eat a diet that includes fresh fruits and vegetables, whole grains, lean protein, and low-fat dairy products. Limit  your intake of foods with high amounts of sugar, saturated fats, and salt.  Take vitamin and mineral supplements as recommended by your health care provider.  Do not drink alcohol if your health care provider tells you not to drink.  If you drink alcohol: ? Limit how much you have to 0-1 drink a day. ? Be aware of how much alcohol is in your drink. In the [place] one drink equals one 12 oz bottle of beer (355 mL), one 5 oz glass of wine (148 mL), or one 1 oz glass of hard liquor (44 mL). Lifestyle  Take daily care of your teeth and gums.  Stay active. Exercise for at least 30 minutes on 5 or more days each week.  Do not use any products that contain nicotine or tobacco, such as cigarettes, e-cigarettes, and chewing tobacco. If you need help quitting, ask your health care provider.  If you are sexually active, practice safe sex. Use a condom or other form of protection in order to prevent STIs (sexually transmitted infections).  Talk with your health care provider about taking a low-dose aspirin or statin. What's next?  Go to your health care provider once a year for a well check visit.  Ask your health care provider how often you should have your eyes and teeth checked.  Stay up to date on all vaccines. This information is not intended to replace advice given to you by your health care provider. Make sure you discuss any questions you have with your health care provider. Document Revised: 05/23/2018 Document Reviewed: 05/23/2018 Elsevier Patient Education  2020 [employer].

## 2020-04-12 NOTE — Assessment & Plan Note (Signed)
Encourage low carb diet Recheck A1c 

## 2020-04-12 NOTE — Assessment & Plan Note (Signed)
Previously well controlled Continue Synthroid at current dose  Recheck TSH and adjust Synthroid as indicated   

## 2020-04-12 NOTE — Assessment & Plan Note (Signed)
Well controlled Continue current medications Recheck metabolic panel F/u in 6 months  

## 2020-04-13 LAB — COMPREHENSIVE METABOLIC PANEL
ALT: 75 IU/L — ABNORMAL HIGH (ref 0–32)
AST: 41 IU/L — ABNORMAL HIGH (ref 0–40)
Albumin/Globulin Ratio: 2 (ref 1.2–2.2)
Albumin: 4.5 g/dL (ref 3.8–4.8)
Alkaline Phosphatase: 62 IU/L (ref 44–121)
BUN/Creatinine Ratio: 18 (ref 12–28)
BUN: 14 mg/dL (ref 8–27)
Bilirubin Total: 0.4 mg/dL (ref 0.0–1.2)
CO2: 24 mmol/L (ref 20–29)
Calcium: 9.5 mg/dL (ref 8.7–10.3)
Chloride: 100 mmol/L (ref 96–106)
Creatinine, Ser: 0.76 mg/dL (ref 0.57–1.00)
GFR calc Af Amer: 95 mL/min/{1.73_m2} (ref >59)
GFR calc non Af Amer: 82 mL/min/{1.73_m2} (ref >59)
Globulin, Total: 2.3 g/dL (ref 1.5–4.5)
Glucose: 117 mg/dL — ABNORMAL HIGH (ref 65–99)
Potassium: 3.7 mmol/L (ref 3.5–5.2)
Sodium: 140 mmol/L (ref 134–144)
Total Protein: 6.8 g/dL (ref 6.0–8.5)

## 2020-04-13 LAB — HEMOGLOBIN A1C
Est. average glucose Bld gHb Est-mCnc: 137 mg/dL
Hgb A1c MFr Bld: 6.4 % — ABNORMAL HIGH (ref 4.8–5.6)

## 2020-04-13 LAB — LIPID PANEL WITH LDL/HDL RATIO
Cholesterol, Total: 221 mg/dL — ABNORMAL HIGH (ref 100–199)
HDL: 38 mg/dL — ABNORMAL LOW (ref >39)
LDL Chol Calc (NIH): 150 mg/dL — ABNORMAL HIGH (ref 0–99)
LDL/HDL Ratio: 3.9 ratio — ABNORMAL HIGH (ref 0.0–3.2)
Triglycerides: 182 mg/dL — ABNORMAL HIGH (ref 0–149)
VLDL Cholesterol Cal: 33 mg/dL (ref 5–40)

## 2020-04-13 LAB — TSH: TSH: 0.31 u[IU]/mL — ABNORMAL LOW (ref 0.450–4.500)

## 2020-04-14 ENCOUNTER — Telehealth: Payer: Self-pay

## 2020-04-14 DIAGNOSIS — E039 Hypothyroidism, unspecified: Secondary | ICD-10-CM

## 2020-04-14 MED ORDER — LEVOTHYROXINE SODIUM 75 MCG PO TABS: ORAL_TABLET | ORAL | 1 refills | Status: DC

## 2020-04-14 NOTE — Telephone Encounter (Signed)
-----   Message from Virginia Crews, MD sent at 04/13/2020  1:10 PM EDT ----- Normal labs, except slight elevation in liver function tests, high cholesterol, A1c in the prediabetic range, low TSH.  Recommend low-carb diet and regular exercise to lower A1c and liver function tests.  Need to decrease Synthroid dose back to 75 mcg daily as the 88 mcg is too much given the low TSH.  Okay to send a new prescription for the patient.  The 10-year ASCVD (heart disease and stroke) risk score Mikey Bussing DC Jr., et al., 2013) is: 10.4%, which is high.  I would recommend a statin to lower cholesterol and therefore heart disease and stroke risk.  If patient is agreeable, okay to start atorvastatin 20 mg daily.  Would recheck labs in 3 months.

## 2020-04-14 NOTE — Telephone Encounter (Signed)
Patient advised as below. Patient refused to start cholesterol reducing medication at this time.

## 2020-06-24 ENCOUNTER — Other Ambulatory Visit: Payer: Self-pay | Admitting: Family Medicine

## 2020-06-24 DIAGNOSIS — I10 Essential (primary) hypertension: Secondary | ICD-10-CM

## 2020-09-30 ENCOUNTER — Other Ambulatory Visit: Payer: Self-pay | Admitting: Family Medicine

## 2020-09-30 DIAGNOSIS — I10 Essential (primary) hypertension: Secondary | ICD-10-CM

## 2020-09-30 DIAGNOSIS — E039 Hypothyroidism, unspecified: Secondary | ICD-10-CM

## 2020-10-08 NOTE — Progress Notes (Signed)
Established patient visit   Patient: Diamond Hinton   DOB: Sep 11, 1955   65 y.o. Female  MRN: [id number] Visit Date: 10/12/2020  Today's healthcare provider: Lavon Paganini, MD   Chief Complaint  Patient presents with  . Hyperglycemia  . Hypertension  . Hyperlipidemia  . Hypothyroidism   Subjective    HPI  Prediabetes, Follow-up  Lab Results  Component Value Date   HGBA1C 6.4 (H) 04/12/2020   HGBA1C 6.1 (H) 01/15/2019   HGBA1C 5.8 (H) 08/28/2017   GLUCOSE 117 (H) 04/12/2020   GLUCOSE 127 (H) 03/17/2019   GLUCOSE 118 (H) 01/15/2019    Last seen for for this 6 months ago.  Management since that visit includes none, encouraged low carb diet.   Prior visit with dietician: no Current diet: in general, an "unhealthy" diet, patient states that she has been working on improving Current exercise: walking  Pertinent Labs:    Component Value Date/Time   CHOL 221 (H) 04/12/2020 1357   TRIG 182 (H) 04/12/2020 1357   CHOLHDL 5.3 (H) 01/15/2019 1020   CREATININE 0.76 04/12/2020 1357    Wt Readings from Last 3 Encounters:  10/12/20 158 lb 9.6 oz (71.9 kg)  04/12/20 161 lb 3.2 oz (73.1 kg)  03/17/19 159 lb 12.8 oz (72.5 kg)    ----------------------------------------------------------------------------------------- Hypertension, follow-up  BP Readings from Last 3 Encounters:  10/12/20 139/73  04/12/20 131/80  03/17/19 129/81   Wt Readings from Last 3 Encounters:  10/12/20 158 lb 9.6 oz (71.9 kg)  04/12/20 161 lb 3.2 oz (73.1 kg)  03/17/19 159 lb 12.8 oz (72.5 kg)     She was last seen for hypertension 6 months ago.  BP at that visit was 131/80. Management since that visit includes none.  She reports excellent compliance with treatment. She is not having side effects. She is following a Regular diet. She is exercising. She does not smoke.  Use of agents associated with hypertension: none.   Outside blood pressures are being checked on  occasion. Symptoms: No chest pain No chest pressure  No palpitations No syncope  No dyspnea No orthopnea  No paroxysmal nocturnal dyspnea No lower extremity edema   Pertinent labs: Lab Results  Component Value Date   CHOL 221 (H) 04/12/2020   HDL 38 (L) 04/12/2020   LDLCALC 150 (H) 04/12/2020   TRIG 182 (H) 04/12/2020   CHOLHDL 5.3 (H) 01/15/2019   Lab Results  Component Value Date   NA 140 04/12/2020   K 3.7 04/12/2020   CREATININE 0.76 04/12/2020   GFRNONAA 82 04/12/2020   GFRAA 95 04/12/2020   GLUCOSE 117 (H) 04/12/2020     The 10-year ASCVD risk score Mikey Bussing DC Jr., et al., 2013) is: 11.6%   --------------------------------------------------------------------------------------------------- Lipid/Cholesterol, Follow-up  Last lipid panel Other pertinent labs  Lab Results  Component Value Date   CHOL 221 (H) 04/12/2020   HDL 38 (L) 04/12/2020   LDLCALC 150 (H) 04/12/2020   TRIG 182 (H) 04/12/2020   CHOLHDL 5.3 (H) 01/15/2019   Lab Results  Component Value Date   ALT 75 (H) 04/12/2020   AST 41 (H) 04/12/2020   PLT 258 12/01/2015   TSH 0.310 (L) 04/12/2020     She was last seen for this 6 months ago.  Management since that visit includes none, patient not on statin. Symptoms: No chest pain No chest pressure/discomfort  No dyspnea No lower extremity edema  No numbness or tingling of extremity No orthopnea  No palpitations No paroxysmal nocturnal dyspnea  No speech difficulty No syncope   Current diet: in general, an "unhealthy" diet Current exercise: walking  The 10-year ASCVD risk score Mikey Bussing DC Jr., et al., 2013) is: 11.6%  --------------------------------------------------------------------------------------------------- Hypothyroid, follow-up  Lab Results  Component Value Date   TSH 0.310 (L) 04/12/2020   TSH 0.599 03/17/2019   TSH 4.330 01/15/2019   FREET4 1.19 08/28/2017   Wt Readings from Last 3 Encounters:  10/12/20 158 lb 9.6 oz (71.9 kg)   04/12/20 161 lb 3.2 oz (73.1 kg)  03/17/19 159 lb 12.8 oz (72.5 kg)    She was last seen for hypothyroid 6 months ago.  Management since that visit includes decreased Synthroid dose to 37mcg. She reports excellent compliance with treatment. She is not having side effects. Symptoms: No change in energy level No constipation  No diarrhea No heat / cold intolerance  No nervousness No palpitations  No weight changes    -----------------------------------------------------------------------------------------  Patient Active Problem List   Diagnosis Date Noted  . Overweight 01/15/2019  . Benign essential HTN 02/19/2015  . Hypercholesteremia 02/19/2015  . Adult hypothyroidism 02/19/2015  . Prediabetes 02/19/2015   Past Medical History:  Diagnosis Date  . Anemia   . Anxiety   . Gallstones   . GERD (gastroesophageal reflux disease)   . Hyperlipidemia   . Hypertension    Allergies  Allergen Reactions  . Peanuts [Peanut Oil] Shortness Of Breath and Itching       Medications: Outpatient Medications Prior to Visit  Medication Sig  . cholecalciferol (VITAMIN D) 1000 units tablet Take 1,000 Units by mouth daily.  . hydrochlorothiazide (HYDRODIURIL) 25 MG tablet Take 1 tablet by mouth once daily  . levothyroxine (SYNTHROID) 75 MCG tablet Take 1 tablet by mouth once daily  . [DISCONTINUED] loratadine (CLARITIN) 10 MG tablet Take 10 mg by mouth daily. (Patient not taking: Reported on 04/12/2020)  . [DISCONTINUED] Probiotic Product (PROBIOTIC DAILY PO) Take by mouth. (Patient not taking: Reported on 04/12/2020)   No facility-administered medications prior to visit.    Review of Systems per HPI     Objective    BP 139/73   Pulse 71   Temp 98.4 F (36.9 C) (Oral)   Resp 16   Wt 158 lb 9.6 oz (71.9 kg)   SpO2 100%   BMI 28.55 kg/m     Physical Exam Vitals reviewed.  Constitutional:      General: She is not in acute distress.    Appearance: Normal appearance. She is  well-developed. She is not diaphoretic.  HENT:     Head: Normocephalic and atraumatic.  Eyes:     General: No scleral icterus.    Conjunctiva/sclera: Conjunctivae normal.  Neck:     Thyroid: No thyromegaly.  Cardiovascular:     Rate and Rhythm: Normal rate and regular rhythm.     Pulses: Normal pulses.     Heart sounds: Normal heart sounds. No murmur heard.   Pulmonary:     Effort: Pulmonary effort is normal. No respiratory distress.     Breath sounds: Normal breath sounds. No wheezing, rhonchi or rales.  Musculoskeletal:     Cervical back: Neck supple.     Right lower leg: No edema.     Left lower leg: No edema.  Lymphadenopathy:     Cervical: No cervical adenopathy.  Skin:    General: Skin is warm and dry.     Findings: No rash.  Neurological:  Mental Status: She is alert and oriented to person, place, and time. Mental status is at baseline.  Psychiatric:        Mood and Affect: Mood normal.        Behavior: Behavior normal.      No results found for any visits on 10/12/20.  Assessment & Plan     Problem List Items Addressed This Visit      Cardiovascular and Mediastinum   Benign essential HTN    Well controlled Continue current medications Recheck metabolic panel F/u in 6 months         Endocrine   Adult hypothyroidism    Previously decreased dose of synthroid Feeling well Continue Synthroid at current dose  Recheck TSH and adjust Synthroid as indicated        Relevant Orders   TSH     Other   Hypercholesteremia    Reviewed last lipid panel Not currently on a statin Recheck FLP and CMP Discussed diet and exercise       Relevant Orders   Lipid panel   Prediabetes - Primary    Recommend low carb diet Recheck A1c      Relevant Orders   POCT glycosylated hemoglobin (Hb A1C)   Comprehensive metabolic panel   Overweight    Discussed importance of healthy weight management Discussed diet and exercise           Return in about 6  months (around 04/14/2021) for chronic disease f/u.      I, Lavon Paganini, MD, have reviewed all documentation for this visit. The documentation on 10/12/20 for the exam, diagnosis, procedures, and orders are all accurate and complete.   Juanmiguel Defelice, Dionne Bucy, MD, MPH [facility]

## 2020-10-11 NOTE — Patient Instructions (Signed)
Diabetes Care, 44(Suppl 1), D40-C14. https://doi.org/https://doi.org/10.2337/dc21-S003">  Prediabetes Prediabetes is when your blood sugar (blood glucose) level is higher than normal but not high enough for you to be diagnosed with type 2 diabetes. Having prediabetes puts you at risk for developing type 2 diabetes (type 2 diabetes mellitus). With certain lifestyle changes, you may be able to prevent or delay the onset of type 2 diabetes. This is important because type 2 diabetes can lead to serious complications, such as:  Heart disease.  Stroke.  Blindness.  Kidney disease.  Depression.  Poor circulation in the feet and legs. In severe cases, this could lead to surgical removal of a leg (amputation). What are the causes? The exact cause of prediabetes is not known. It may result from insulin resistance. Insulin resistance develops when cells in the body do not respond properly to insulin that the body makes. This can cause excess glucose to build up in the blood. High blood glucose (hyperglycemia) can develop. What increases the risk? The following factors may make you more likely to develop this condition:  You have a family member with type 2 diabetes.  You are older than 45 years.  You had a temporary form of diabetes during a pregnancy (gestational diabetes).  You had polycystic ovary syndrome (PCOS).  You are overweight or obese.  You are inactive (sedentary).  You have a history of heart disease, including problems with cholesterol levels, high levels of blood fats, or high blood pressure. What are the signs or symptoms? You may have no symptoms. If you do have symptoms, they may include:  Increased hunger.  Increased thirst.  Increased urination.  Vision changes, such as blurry vision.  Tiredness (fatigue). How is this diagnosed? This condition can be diagnosed with blood tests. Your blood glucose may be checked with one or more of the following tests:  A  fasting blood glucose (FBG) test. You will not be allowed to eat (you will fast) for at least 8 hours before a blood sample is taken.  An A1C blood test (hemoglobin A1C). This test provides information about blood glucose levels over the previous 2?3 months.  An oral glucose tolerance test (OGTT). This test measures your blood glucose at two points in time: ? After fasting. This is your baseline level. ? Two hours after you drink a beverage that contains glucose. You may be diagnosed with prediabetes if:  Your FBG is 100?125 mg/dL (5.6-6.9 mmol/L).  Your A1C level is 5.7?6.4% (39-46 mmol/mol).  Your OGTT result is 140?199 mg/dL (7.8-11 mmol/L). These blood tests may be repeated to confirm your diagnosis.   How is this treated? Treatment may include dietary and lifestyle changes to help lower your blood glucose and prevent type 2 diabetes from developing. In some cases, medicine may be prescribed to help lower the risk of type 2 diabetes. Follow these instructions at home: Nutrition  Follow a healthy meal plan. This includes eating lean proteins, whole grains, legumes, fresh fruits and vegetables, low-fat dairy products, and healthy fats.  Follow instructions from your health care provider about eating or drinking restrictions.  Meet with a dietitian to create a healthy eating plan that is right for you.   Lifestyle  Do moderate-intensity exercise for at least 30 minutes a day on 5 or more days each week, or as told by your health care provider. A mix of activities may be best, such as: ? Brisk walking, swimming, biking, and weight lifting.  Lose weight as told by your health  care provider. Losing 5-7% of your body weight can reverse insulin resistance.  Do not drink alcohol if: ? Your health care provider tells you not to drink. ? You are pregnant, may be pregnant, or are planning to become pregnant.  If you drink alcohol: ? Limit how much you use to:  0-1 drink a day for  women.  0-2 drinks a day for men. ? Be aware of how much alcohol is in your drink. In the [place] one drink equals one 12 oz bottle of beer (355 mL), one 5 oz glass of wine (148 mL), or one 1 oz glass of hard liquor (44 mL). General instructions  Take over-the-counter and prescription medicines only as told by your health care provider. You may be prescribed medicines that help lower the risk of type 2 diabetes.  Do not use any products that contain nicotine or tobacco, such as cigarettes, e-cigarettes, and chewing tobacco. If you need help quitting, ask your health care provider.  Keep all follow-up visits. This is important. Where to find more information  American Diabetes Association: www.diabetes.org  Academy of Nutrition and Dietetics: www.eatright.org  American Heart Association: www.heart.org Contact a health care provider if:  You have any of these symptoms: ? Increased hunger. ? Increased urination. ? Increased thirst. ? Fatigue. ? Vision changes, such as blurry vision. Get help right away if you:  Have shortness of breath.  Feel confused.  Vomit or feel like you may vomit. Summary  Prediabetes is when your blood sugar (blood glucose)level is higher than normal but not high enough for you to be diagnosed with type 2 diabetes.  Having prediabetes puts you at risk for developing type 2 diabetes (type 2 diabetes mellitus).  Make lifestyle changes such as eating a healthy diet and exercising regularly to help prevent diabetes. Lose weight as told by your health care provider. This information is not intended to replace advice given to you by your health care provider. Make sure you discuss any questions you have with your health care provider. Document Revised: 08/28/2019 Document Reviewed: 08/28/2019 Elsevier Patient Education  2021 Elsevier Inc. Hypertension, Adult High blood pressure (hypertension) is when the force of blood pumping through the arteries is too  strong. The arteries are the blood vessels that carry blood from the heart throughout the body. Hypertension forces the heart to work harder to pump blood and may cause arteries to become narrow or stiff. Untreated or uncontrolled hypertension can cause a heart attack, heart failure, a stroke, kidney disease, and other problems. A blood pressure reading consists of a higher number over a lower number. Ideally, your blood pressure should be below 120/80. The first ("top") number is called the systolic pressure. It is a measure of the pressure in your arteries as your heart beats. The second ("bottom") number is called the diastolic pressure. It is a measure of the pressure in your arteries as the heart relaxes. What are the causes? The exact cause of this condition is not known. There are some conditions that result in or are related to high blood pressure. What increases the risk? Some risk factors for high blood pressure are under your control. The following factors may make you more likely to develop this condition:  Smoking.  Having type 2 diabetes mellitus, high cholesterol, or both.  Not getting enough exercise or physical activity.  Being overweight.  Having too much fat, sugar, calories, or salt (sodium) in your diet.  Drinking too much alcohol. Some risk factors   for high blood pressure may be difficult or impossible to change. Some of these factors include:  Having chronic kidney disease.  Having a family history of high blood pressure.  Age. Risk increases with age.  Race. You may be at higher risk if you are African American.  Gender. Men are at higher risk than women before age 45. After age 65, women are at higher risk than men.  Having obstructive sleep apnea.  Stress. What are the signs or symptoms? High blood pressure may not cause symptoms. Very high blood pressure (hypertensive crisis) may cause:  Headache.  Anxiety.  Shortness of  breath.  Nosebleed.  Nausea and vomiting.  Vision changes.  Severe chest pain.  Seizures. How is this diagnosed? This condition is diagnosed by measuring your blood pressure while you are seated, with your arm resting on a flat surface, your legs uncrossed, and your feet flat on the floor. The cuff of the blood pressure monitor will be placed directly against the skin of your upper arm at the level of your heart. It should be measured at least twice using the same arm. Certain conditions can cause a difference in blood pressure between your right and left arms. Certain factors can cause blood pressure readings to be lower or higher than normal for a short period of time:  When your blood pressure is higher when you are in a health care provider's office than when you are at home, this is called white coat hypertension. Most people with this condition do not need medicines.  When your blood pressure is higher at home than when you are in a health care provider's office, this is called masked hypertension. Most people with this condition may need medicines to control blood pressure. If you have a high blood pressure reading during one visit or you have normal blood pressure with other risk factors, you may be asked to:  Return on a different day to have your blood pressure checked again.  Monitor your blood pressure at home for 1 week or longer. If you are diagnosed with hypertension, you may have other blood or imaging tests to help your health care provider understand your overall risk for other conditions. How is this treated? This condition is treated by making healthy lifestyle changes, such as eating healthy foods, exercising more, and reducing your alcohol intake. Your health care provider may prescribe medicine if lifestyle changes are not enough to get your blood pressure under control, and if:  Your systolic blood pressure is above 130.  Your diastolic blood pressure is above  80. Your personal target blood pressure may vary depending on your medical conditions, your age, and other factors. Follow these instructions at home: Eating and drinking  Eat a diet that is high in fiber and potassium, and low in sodium, added sugar, and fat. An example eating plan is called the DASH (Dietary Approaches to Stop Hypertension) diet. To eat this way: ? Eat plenty of fresh fruits and vegetables. Try to fill one half of your plate at each meal with fruits and vegetables. ? Eat whole grains, such as whole-wheat pasta, brown rice, or whole-grain bread. Fill about one fourth of your plate with whole grains. ? Eat or drink low-fat dairy products, such as skim milk or low-fat yogurt. ? Avoid fatty cuts of meat, processed or cured meats, and poultry with skin. Fill about one fourth of your plate with lean proteins, such as fish, chicken without skin, beans, eggs, or tofu. ?   Avoid pre-made and processed foods. These tend to be higher in sodium, added sugar, and fat.  Reduce your daily sodium intake. Most people with hypertension should eat less than 1,500 mg of sodium a day.  Do not drink alcohol if: ? Your health care provider tells you not to drink. ? You are pregnant, may be pregnant, or are planning to become pregnant.  If you drink alcohol: ? Limit how much you use to:  0-1 drink a day for women.  0-2 drinks a day for men. ? Be aware of how much alcohol is in your drink. In the [place] one drink equals one 12 oz bottle of beer (355 mL), one 5 oz glass of wine (148 mL), or one 1 oz glass of hard liquor (44 mL).   Lifestyle  Work with your health care provider to maintain a healthy body weight or to lose weight. Ask what an ideal weight is for you.  Get at least 30 minutes of exercise most days of the week. Activities may include walking, swimming, or biking.  Include exercise to strengthen your muscles (resistance exercise), such as Pilates or lifting weights, as part of your  weekly exercise routine. Try to do these types of exercises for 30 minutes at least 3 days a week.  Do not use any products that contain nicotine or tobacco, such as cigarettes, e-cigarettes, and chewing tobacco. If you need help quitting, ask your health care provider.  Monitor your blood pressure at home as told by your health care provider.  Keep all follow-up visits as told by your health care provider. This is important.   Medicines  Take over-the-counter and prescription medicines only as told by your health care provider. Follow directions carefully. Blood pressure medicines must be taken as prescribed.  Do not skip doses of blood pressure medicine. Doing this puts you at risk for problems and can make the medicine less effective.  Ask your health care provider about side effects or reactions to medicines that you should watch for. Contact a health care provider if you:  Think you are having a reaction to a medicine you are taking.  Have headaches that keep coming back (recurring).  Feel dizzy.  Have swelling in your ankles.  Have trouble with your vision. Get help right away if you:  Develop a severe headache or confusion.  Have unusual weakness or numbness.  Feel faint.  Have severe pain in your chest or abdomen.  Vomit repeatedly.  Have trouble breathing. Summary  Hypertension is when the force of blood pumping through your arteries is too strong. If this condition is not controlled, it may put you at risk for serious complications.  Your personal target blood pressure may vary depending on your medical conditions, your age, and other factors. For most people, a normal blood pressure is less than 120/80.  Hypertension is treated with lifestyle changes, medicines, or a combination of both. Lifestyle changes include losing weight, eating a healthy, low-sodium diet, exercising more, and limiting alcohol. This information is not intended to replace advice given to  you by your health care provider. Make sure you discuss any questions you have with your health care provider. Document Revised: 02/06/2018 Document Reviewed: 02/06/2018 Elsevier Patient Education  2021 [employer].

## 2020-10-12 ENCOUNTER — Ambulatory Visit (INDEPENDENT_AMBULATORY_CARE_PROVIDER_SITE_OTHER): Payer: Medicare HMO | Admitting: Family Medicine

## 2020-10-12 ENCOUNTER — Encounter: Payer: Self-pay | Admitting: Family Medicine

## 2020-10-12 ENCOUNTER — Other Ambulatory Visit: Payer: Self-pay

## 2020-10-12 VITALS — BP 139/73 | HR 71 | Temp 98.4°F | Resp 16 | Wt 158.6 lb

## 2020-10-12 DIAGNOSIS — I10 Essential (primary) hypertension: Secondary | ICD-10-CM | POA: Diagnosis not present

## 2020-10-12 DIAGNOSIS — E7800 Pure hypercholesterolemia, unspecified: Secondary | ICD-10-CM | POA: Diagnosis not present

## 2020-10-12 DIAGNOSIS — R7303 Prediabetes: Secondary | ICD-10-CM | POA: Diagnosis not present

## 2020-10-12 DIAGNOSIS — E039 Hypothyroidism, unspecified: Secondary | ICD-10-CM | POA: Diagnosis not present

## 2020-10-12 DIAGNOSIS — E663 Overweight: Secondary | ICD-10-CM

## 2020-10-12 MED ORDER — LEVOTHYROXINE SODIUM 75 MCG PO TABS: 75.0000 ug | ORAL_TABLET | Freq: Every day | ORAL | 3 refills | Status: DC

## 2020-10-12 MED ORDER — HYDROCHLOROTHIAZIDE 25 MG PO TABS: 1.0000 | ORAL_TABLET | Freq: Every day | ORAL | 3 refills | Status: DC

## 2020-10-12 NOTE — Assessment & Plan Note (Signed)
Recommend low carb diet °Recheck A1c  °

## 2020-10-12 NOTE — Assessment & Plan Note (Signed)
Well controlled Continue current medications Recheck metabolic panel F/u in 6 months  

## 2020-10-12 NOTE — Assessment & Plan Note (Signed)
Previously decreased dose of synthroid Feeling well Continue Synthroid at current dose  Recheck TSH and adjust Synthroid as indicated

## 2020-10-12 NOTE — Assessment & Plan Note (Signed)
Discussed importance of healthy weight management Discussed diet and exercise  

## 2020-10-12 NOTE — Assessment & Plan Note (Signed)
Reviewed last lipid panel Not currently on a statin Recheck FLP and CMP Discussed diet and exercise  

## 2020-10-12 NOTE — Addendum Note (Signed)
Addended by: Minette Headland on: 10/12/2020 08:51 AM   Modules accepted: Orders

## 2020-10-13 LAB — COMPREHENSIVE METABOLIC PANEL
ALT: 48 IU/L — ABNORMAL HIGH (ref 0–32)
AST: 31 IU/L (ref 0–40)
Albumin/Globulin Ratio: 1.8 (ref 1.2–2.2)
Albumin: 4.6 g/dL (ref 3.8–4.8)
Alkaline Phosphatase: 63 IU/L (ref 44–121)
BUN/Creatinine Ratio: 17 (ref 12–28)
BUN: 14 mg/dL (ref 8–27)
Bilirubin Total: 0.5 mg/dL (ref 0.0–1.2)
CO2: 26 mmol/L (ref 20–29)
Calcium: 10.1 mg/dL (ref 8.7–10.3)
Chloride: 99 mmol/L (ref 96–106)
Creatinine, Ser: 0.81 mg/dL (ref 0.57–1.00)
Globulin, Total: 2.5 g/dL (ref 1.5–4.5)
Glucose: 155 mg/dL — ABNORMAL HIGH (ref 65–99)
Potassium: 4.6 mmol/L (ref 3.5–5.2)
Sodium: 139 mmol/L (ref 134–144)
Total Protein: 7.1 g/dL (ref 6.0–8.5)
eGFR: 80 mL/min/{1.73_m2} (ref >59)

## 2020-10-13 LAB — LIPID PANEL
Chol/HDL Ratio: 5.5 ratio — ABNORMAL HIGH (ref 0.0–4.4)
Cholesterol, Total: 215 mg/dL — ABNORMAL HIGH (ref 100–199)
HDL: 39 mg/dL — ABNORMAL LOW (ref >39)
LDL Chol Calc (NIH): 146 mg/dL — ABNORMAL HIGH (ref 0–99)
Triglycerides: 165 mg/dL — ABNORMAL HIGH (ref 0–149)
VLDL Cholesterol Cal: 30 mg/dL (ref 5–40)

## 2020-10-13 LAB — HEMOGLOBIN A1C
Est. average glucose Bld gHb Est-mCnc: 143 mg/dL
Hgb A1c MFr Bld: 6.6 % — ABNORMAL HIGH (ref 4.8–5.6)

## 2020-10-13 LAB — TSH: TSH: 3.05 u[IU]/mL (ref 0.450–4.500)

## 2020-10-14 ENCOUNTER — Telehealth: Payer: Self-pay

## 2020-10-14 MED ORDER — METFORMIN HCL 500 MG PO TABS: 500.0000 mg | ORAL_TABLET | Freq: Two times a day (BID) | ORAL | 2 refills | Status: DC

## 2020-10-14 NOTE — Telephone Encounter (Signed)
-----   Message from Virginia Crews, MD sent at 10/14/2020 10:18 AM EDT ----- Normal/stable labs, except for high cholesterol and increased A1c.  Liver function tests have returned to normal.  A1c is now in the diabetic range.  This, in addition to her high cholesterol, suggest that she should take a statin to decrease her risk of heart disease and stroke.  Would also recommend metformin 500 mg twice daily to decrease her blood sugar.  If she is agreeable to these medications, we can send in the metformin with a 30-day supply and 2 refills and Crestor 5 mg daily #90  r3.  Will need to follow-up in 3 months and repeat A1c

## 2020-10-14 NOTE — Telephone Encounter (Signed)
Patient advised. Will start metformin and refused to start crestor for now.

## 2020-11-15 DIAGNOSIS — H524 Presbyopia: Secondary | ICD-10-CM | POA: Diagnosis not present

## 2021-04-01 ENCOUNTER — Telehealth: Payer: Self-pay

## 2021-04-01 NOTE — Telephone Encounter (Signed)
Copied from [place] [id number]. Topic: General - Other >> Apr 01, 2021  3:46 PM Bayard Beaver wrote: Reason for ZES:[id number] wants to know details o why she has to be rescheduled if Dr Brita Romp is going out of town. Social research officer, government.Marland KitchenMarland Kitchen

## 2021-04-14 ENCOUNTER — Ambulatory Visit: Payer: Self-pay | Admitting: Family Medicine

## 2021-04-18 ENCOUNTER — Encounter: Payer: Self-pay | Admitting: Family Medicine

## 2021-04-18 ENCOUNTER — Ambulatory Visit (INDEPENDENT_AMBULATORY_CARE_PROVIDER_SITE_OTHER): Payer: Medicare HMO | Admitting: Family Medicine

## 2021-04-18 ENCOUNTER — Other Ambulatory Visit: Payer: Self-pay

## 2021-04-18 VITALS — BP 132/86 | HR 72 | Temp 99.0°F | Resp 16 | Wt 142.4 lb

## 2021-04-18 DIAGNOSIS — E1159 Type 2 diabetes mellitus with other circulatory complications: Secondary | ICD-10-CM | POA: Insufficient documentation

## 2021-04-18 DIAGNOSIS — R7989 Other specified abnormal findings of blood chemistry: Secondary | ICD-10-CM | POA: Diagnosis not present

## 2021-04-18 DIAGNOSIS — E1169 Type 2 diabetes mellitus with other specified complication: Secondary | ICD-10-CM | POA: Diagnosis not present

## 2021-04-18 DIAGNOSIS — I152 Hypertension secondary to endocrine disorders: Secondary | ICD-10-CM | POA: Insufficient documentation

## 2021-04-18 DIAGNOSIS — E785 Hyperlipidemia, unspecified: Secondary | ICD-10-CM

## 2021-04-18 DIAGNOSIS — E039 Hypothyroidism, unspecified: Secondary | ICD-10-CM | POA: Diagnosis not present

## 2021-04-18 DIAGNOSIS — Z23 Encounter for immunization: Secondary | ICD-10-CM

## 2021-04-18 DIAGNOSIS — I10 Essential (primary) hypertension: Secondary | ICD-10-CM | POA: Insufficient documentation

## 2021-04-18 DIAGNOSIS — E119 Type 2 diabetes mellitus without complications: Secondary | ICD-10-CM | POA: Insufficient documentation

## 2021-04-18 DIAGNOSIS — E663 Overweight: Secondary | ICD-10-CM

## 2021-04-18 NOTE — Progress Notes (Signed)
Established patient visit   Patient: Diamond Hinton   DOB: January 02, 1956   65 y.o. Female  MRN: [id number] Visit Date: 04/18/2021  Today's healthcare provider: Lavon Paganini, MD   Chief Complaint  Patient presents with   Diabetes   Hypertension   Hyperlipidemia   Subjective    Started metformin on 03/12/2021. Tolerating metformin well with no GI upset. She reports feeling better since starting metformin.  She is curious about potential causes of chronic elevated ALT   Medications: Outpatient Medications Prior to Visit  Medication Sig   cholecalciferol (VITAMIN D) 1000 units tablet Take 1,000 Units by mouth daily.   hydrochlorothiazide (HYDRODIURIL) 25 MG tablet Take 1 tablet (25 mg total) by mouth daily. (Patient taking differently: Take 12.5 tablets by mouth daily.)   levothyroxine (SYNTHROID) 75 MCG tablet Take 1 tablet (75 mcg total) by mouth daily.   metFORMIN (GLUCOPHAGE) 500 MG tablet Take 1 tablet (500 mg total) by mouth 2 (two) times daily with a meal.   No facility-administered medications prior to visit.    Review of Systems  Constitutional: Negative.   Respiratory: Negative.    Cardiovascular: Negative.   Gastrointestinal:  Negative for abdominal pain, diarrhea, nausea and vomiting.  All other systems reviewed and are negative.     Objective    BP 132/86 (BP Location: Left Arm, Patient Position: Sitting, Cuff Size: Normal)   Pulse 72   Temp 99 F (37.2 C) (Oral)   Resp 16   Wt 142 lb 6.4 oz (64.6 kg)   SpO2 98%   BMI 25.63 kg/m  {Show previous vital signs (optional):23777}  Physical Exam Vitals reviewed.  Constitutional:      General: She is not in acute distress.    Appearance: Normal appearance. She is not ill-appearing or toxic-appearing.  HENT:     Head: Normocephalic and atraumatic.     Right Ear: External ear normal.     Left Ear: External ear normal.     Nose: Nose normal.     Mouth/Throat:     Mouth: Mucous membranes are moist.      Pharynx: Oropharynx is clear. No oropharyngeal exudate or posterior oropharyngeal erythema.  Eyes:     General: No scleral icterus.    Extraocular Movements: Extraocular movements intact.     Conjunctiva/sclera: Conjunctivae normal.     Pupils: Pupils are equal, round, and reactive to light.  Cardiovascular:     Rate and Rhythm: Normal rate and regular rhythm.     Pulses: Normal pulses.     Heart sounds: Normal heart sounds. No murmur heard.   No friction rub. No gallop.  Pulmonary:     Effort: Pulmonary effort is normal. No respiratory distress.     Breath sounds: Normal breath sounds. No wheezing or rhonchi.  Chest:     Chest wall: No tenderness.  Abdominal:     General: Abdomen is flat. There is no distension.     Palpations: Abdomen is soft.     Tenderness: There is no abdominal tenderness.  Musculoskeletal:        General: Normal range of motion.     Cervical back: Normal range of motion and neck supple.     Right lower leg: No edema.     Left lower leg: No edema.  Skin:    General: Skin is warm and dry.     Capillary Refill: Capillary refill takes less than 2 seconds.     Findings: No lesion  or rash.  Neurological:     General: No focal deficit present.     Mental Status: She is alert and oriented to person, place, and time. Mental status is at baseline.  Psychiatric:        Mood and Affect: Mood normal.    Diabetic Foot Exam - Simple   Simple Foot Form Diabetic Foot exam was performed with the following findings: Yes 04/18/2021  4:05 PM  Visual Inspection No deformities, no ulcerations, no other skin breakdown bilaterally: Yes Sensation Testing Intact to touch and monofilament testing bilaterally: Yes Pulse Check Posterior Tibialis and Dorsalis pulse intact bilaterally: Yes Comments      No results found for any visits on 04/18/21.  Assessment & Plan     Problem List Items Addressed This Visit       Cardiovascular and Mediastinum   Hypertension  associated with diabetes ([place])    Chronic Check home BP Continue HCTZ 25mg  daily      Relevant Orders   Comprehensive metabolic panel     Endocrine   Hyperlipidemia associated with type 2 diabetes mellitus ([place]) - Primary    Last lipid panel within normal limits Discussed diet and exercise Recheck lipid panel      Relevant Orders   Comprehensive metabolic panel   Lipid Panel With LDL/HDL Ratio   Adult hypothyroidism    Chronic, well controlled Feeling well Continue levothyroxine 19mcg Recheck TSH and adjust levothyroxine as needed      Relevant Orders   TSH   Diabetes mellitus without complication ([facility])    Chronic Continue metformin 500mg  BID Recheck HgbA1C and CMP        Other   Overweight   Other Visit Diagnoses     Elevated LFTs       Relevant Orders   Comprehensive metabolic panel   Hepatitis C Antibody   Hepatitis B Surface AntiBODY   Hepatitis B Surface AntiGEN   Hepatitis A Ab, Total   Hepatitis B Core Antibody, total   Need for pneumococcal vaccination       Relevant Orders   Pneumococcal conjugate vaccine 20-valent (Prevnar 20)        Return in about 6 months (around 10/16/2021) for chronic disease f/u.      Nelva Nay, Medical Student 04/18/2021, 4:11 PM    Total time spent on today's visit was greater than 40 minutes, including both face-to-face time and nonface-to-face time personally spent on review of chart (labs and imaging), discussing labs and goals, discussing further work-up, treatment options, referrals to specialist if needed, reviewing outside records of pertinent, answering patient's questions, and coordinating care.  Patient seen along with MS3 student Nelva Nay. I personally evaluated this patient along with the student, and verified all aspects of the history, physical exam, and medical decision making as documented by the student. I agree with the student's documentation and have made all necessary edits.  Anallely Rosell, Dionne Bucy, MD, MPH [facility]

## 2021-04-18 NOTE — Assessment & Plan Note (Addendum)
Chronic Check home BP Continue HCTZ 25mg  daily Recheck CMP

## 2021-04-18 NOTE — Assessment & Plan Note (Signed)
Last lipid panel within normal limits Discussed diet and exercise Recheck lipid panel

## 2021-04-18 NOTE — Assessment & Plan Note (Addendum)
Chronic Continue metformin 500mg  BID Recheck HgbA1C and CMP

## 2021-04-18 NOTE — Assessment & Plan Note (Signed)
Chronic, well controlled Feeling well Continue levothyroxine 46mcg Recheck TSH and adjust levothyroxine as needed

## 2021-04-20 DIAGNOSIS — E1169 Type 2 diabetes mellitus with other specified complication: Secondary | ICD-10-CM | POA: Diagnosis not present

## 2021-04-20 DIAGNOSIS — E039 Hypothyroidism, unspecified: Secondary | ICD-10-CM | POA: Diagnosis not present

## 2021-04-20 DIAGNOSIS — E1159 Type 2 diabetes mellitus with other circulatory complications: Secondary | ICD-10-CM | POA: Diagnosis not present

## 2021-04-20 DIAGNOSIS — I152 Hypertension secondary to endocrine disorders: Secondary | ICD-10-CM | POA: Diagnosis not present

## 2021-04-20 DIAGNOSIS — R7989 Other specified abnormal findings of blood chemistry: Secondary | ICD-10-CM | POA: Diagnosis not present

## 2021-04-20 DIAGNOSIS — E785 Hyperlipidemia, unspecified: Secondary | ICD-10-CM | POA: Diagnosis not present

## 2021-04-21 ENCOUNTER — Telehealth: Payer: Self-pay

## 2021-04-21 LAB — COMPREHENSIVE METABOLIC PANEL
ALT: 22 IU/L (ref 0–32)
AST: 19 IU/L (ref 0–40)
Albumin/Globulin Ratio: 2.3 — ABNORMAL HIGH (ref 1.2–2.2)
Albumin: 4.9 g/dL — ABNORMAL HIGH (ref 3.8–4.8)
Alkaline Phosphatase: 84 IU/L (ref 44–121)
BUN/Creatinine Ratio: 16 (ref 12–28)
BUN: 13 mg/dL (ref 8–27)
Bilirubin Total: 0.5 mg/dL (ref 0.0–1.2)
CO2: 24 mmol/L (ref 20–29)
Calcium: 9.8 mg/dL (ref 8.7–10.3)
Chloride: 99 mmol/L (ref 96–106)
Creatinine, Ser: 0.83 mg/dL (ref 0.57–1.00)
Globulin, Total: 2.1 g/dL (ref 1.5–4.5)
Glucose: 125 mg/dL — ABNORMAL HIGH (ref 70–99)
Potassium: 4.3 mmol/L (ref 3.5–5.2)
Sodium: 140 mmol/L (ref 134–144)
Total Protein: 7 g/dL (ref 6.0–8.5)
eGFR: 77 mL/min/{1.73_m2} (ref >59)

## 2021-04-21 LAB — LIPID PANEL WITH LDL/HDL RATIO
Cholesterol, Total: 209 mg/dL — ABNORMAL HIGH (ref 100–199)
HDL: 41 mg/dL (ref >39)
LDL Chol Calc (NIH): 137 mg/dL — ABNORMAL HIGH (ref 0–99)
LDL/HDL Ratio: 3.3 ratio — ABNORMAL HIGH (ref 0.0–3.2)
Triglycerides: 171 mg/dL — ABNORMAL HIGH (ref 0–149)
VLDL Cholesterol Cal: 31 mg/dL (ref 5–40)

## 2021-04-21 LAB — HEPATITIS B CORE ANTIBODY, TOTAL: Hep B Core Total Ab: NEGATIVE

## 2021-04-21 LAB — HEPATITIS B SURFACE ANTIGEN: Hepatitis B Surface Ag: NEGATIVE

## 2021-04-21 LAB — HEMOGLOBIN A1C
Est. average glucose Bld gHb Est-mCnc: 140 mg/dL
Hgb A1c MFr Bld: 6.5 % — ABNORMAL HIGH (ref 4.8–5.6)

## 2021-04-21 LAB — HEPATITIS B SURFACE ANTIBODY,QUALITATIVE: Hep B Surface Ab, Qual: NONREACTIVE

## 2021-04-21 LAB — HEPATITIS C ANTIBODY: Hep C Virus Ab: <0.1 s/co ratio (ref 0.0–0.9)

## 2021-04-21 LAB — TSH: TSH: 2.93 u[IU]/mL (ref 0.450–4.500)

## 2021-04-21 LAB — HEPATITIS A ANTIBODY, TOTAL: hep A Total Ab: NEGATIVE

## 2021-04-21 NOTE — Telephone Encounter (Signed)
Patient aware. Patient does not which to start cholesterol medication at this time. Requested a copy of her labs. Labs printed and placed up front ready for pick up.

## 2021-04-21 NOTE — Telephone Encounter (Signed)
-----   Message from Virginia Crews, MD sent at 04/21/2021 12:11 PM EST ----- Normal/stable labs, except for high cholesterol.  Liver function tests are normal today.  No hepatitis A, B, or C.  However, she is not immune to hepatitis A or B, so she should consider vaccination for these.  The 10-year ASCVD (heart disease and stroke) risk score (Arnett DK, et al., 2019) is: 20.1%, which is quite high.  Recommend statin to lower her risk of heart disease and stroke.  If patient is amenable, recommend starting Crestor 5 mg daily.  Okay to send in prescription if patient agrees.  If she is worried about myalgias or sore muscles from the statin, she can take co-Q10 100 mg daily also to prevent this.

## 2021-05-09 ENCOUNTER — Other Ambulatory Visit: Payer: Self-pay | Admitting: Family Medicine

## 2021-05-09 DIAGNOSIS — E039 Hypothyroidism, unspecified: Secondary | ICD-10-CM

## 2021-05-09 NOTE — Telephone Encounter (Signed)
[facility] faxed refill request for the following medications:  levothyroxine (SYNTHROID) 75 MCG tablet   Please advise.

## 2021-05-09 NOTE — Telephone Encounter (Signed)
10/12/20 #90 RF 3

## 2021-05-09 NOTE — Telephone Encounter (Signed)
[facility] faxed refill request for the following medications:   levothyroxine (SYNTHROID) 75 MCG tablet    Please advise.

## 2021-05-12 DIAGNOSIS — Z0100 Encounter for examination of eyes and vision without abnormal findings: Secondary | ICD-10-CM | POA: Diagnosis not present

## 2021-06-16 ENCOUNTER — Other Ambulatory Visit: Payer: Self-pay | Admitting: Family Medicine

## 2021-08-02 ENCOUNTER — Telehealth: Payer: Self-pay | Admitting: Family Medicine

## 2021-08-02 NOTE — Telephone Encounter (Signed)
Copied from [place] [id number]. Topic: Medicare AWV >> Aug 02, 2021  2:50 PM Cher Nakai R wrote: Reason for CRM:  Left message for patient to call back and schedule Medicare Annual Wellness Visit (AWV) in office.   If not able to come in the office, please offer to do virtually or by telephone.   No hx of AWV - AWV-I eligible as of 12/11/2019 per [employer]  Please schedule at anytime with [facility] Health Advisor.   40 minute appointment  Any questions, please contact me at [phone]

## 2021-08-04 ENCOUNTER — Ambulatory Visit (INDEPENDENT_AMBULATORY_CARE_PROVIDER_SITE_OTHER): Payer: Medicare HMO | Admitting: Physician Assistant

## 2021-08-04 ENCOUNTER — Encounter: Payer: Self-pay | Admitting: Physician Assistant

## 2021-08-04 ENCOUNTER — Other Ambulatory Visit: Payer: Self-pay

## 2021-08-04 VITALS — BP 123/77 | HR 79 | Temp 97.5°F | Resp 16 | Ht 63.0 in | Wt 139.9 lb

## 2021-08-04 DIAGNOSIS — E119 Type 2 diabetes mellitus without complications: Secondary | ICD-10-CM | POA: Diagnosis not present

## 2021-08-04 DIAGNOSIS — K2960 Other gastritis without bleeding: Secondary | ICD-10-CM | POA: Diagnosis not present

## 2021-08-04 DIAGNOSIS — E1169 Type 2 diabetes mellitus with other specified complication: Secondary | ICD-10-CM

## 2021-08-04 DIAGNOSIS — R1010 Upper abdominal pain, unspecified: Secondary | ICD-10-CM | POA: Diagnosis not present

## 2021-08-04 DIAGNOSIS — R319 Hematuria, unspecified: Secondary | ICD-10-CM | POA: Diagnosis not present

## 2021-08-04 LAB — POCT URINALYSIS DIPSTICK
Bilirubin, UA: NEGATIVE
Glucose, UA: NEGATIVE
Ketones, UA: NEGATIVE
Nitrite, UA: NEGATIVE
Protein, UA: NEGATIVE
Spec Grav, UA: 1.01 (ref 1.010–1.025)
Urobilinogen, UA: 0.2 E.U./dL
pH, UA: 7.5 (ref 5.0–8.0)

## 2021-08-04 MED ORDER — SUCRALFATE 1 G PO TABS: 1.0000 g | ORAL_TABLET | Freq: Three times a day (TID) | ORAL | 2 refills | Status: DC

## 2021-08-04 MED ORDER — FAMOTIDINE 10 MG PO TABS: 10.0000 mg | ORAL_TABLET | Freq: Two times a day (BID) | ORAL | 3 refills | Status: DC

## 2021-08-04 NOTE — Assessment & Plan Note (Signed)
Acute, new problem Symptoms comprised of belching, abdominal discomfort, nausea that appear to be triggered by specific foods and worsens with reclining.  Discussed diet changes to prevent triggering symptoms in addition to medication management  Provided Famotidine 10 mg BID for controller therapy along with Sucralfate for PRN management  Recommend follow up in 4 weeks if symptoms are persisting  Discussed continuing Metformin after a few days of therapy to reduce discomfort and nausea.

## 2021-08-04 NOTE — Patient Instructions (Addendum)
I'd like you to try taking Famotidine 10 mg by mouth twice per day along with Sucralfate to help reduce irritation You can take the Sucralfate as needed up to three times per day and at nighttime to help with reducing GERD symptoms We will let you know the results of your urine culture and if there needs to be any intervention with that.

## 2021-08-04 NOTE — Progress Notes (Signed)
Established patient visit   Patient: Diamond Hinton   DOB: July 20, 1955   66 y.o. Female  MRN: [id number] Visit Date: 08/04/2021  Today's healthcare provider: Dani Gobble Killian Schwer, PA-C  Introduced myself to the patient as a Journalist, newspaper and provided education on APPs in clinical practice.    I,Joseline E Rosas,acting as a scribe for [employer], PA-C.,have documented all relevant documentation on the behalf of [place], PA-C,as directed by  [employer], PA-C while in the presence of Addalynne Golding E Robertta Halfhill, PA-C.   No chief complaint on file.  Subjective    HPI  Abdominal distention: reports that she I not able to eat much. She is having,belching, feels stomach "rumbling". Pain in epigastric region. Reports that one day she had that pain in her epigastric region and the next day her urine had blood in it. Associated symptoms: burping, fullness, acid reflux, abdominal discomfort. When lying on her right side her symptoms are worse. Patient reports that she came off the Metformin because she started to have GI symptoms. Feeling nauseas.Reports that she is not sure if is coming from the Metformin or something different is going on. Reports that she still having the same symptoms after stopping the Metformin. She is taking OTC Omeprazole, reports that is helping some.   States this began about a month ago - woke up with nausea and stomach upset, along with 3 small bowel movements. Thought this may have been from eating out This continued for a few days and she was worried she may have caught a "stomach bug". She states she had intense belching for several days as well.   States that now she is usually able to eat a bit in the morning but by the evening she has more nausea, abdominal pain  States she has tried omeprazole otc - this seems to help with the "gurgling" in her abdomen States she feels like she is not digesting her dinner well. States it feels like it will sit in her stomach  States she is having  a lot of bloating - Rolaids and Omeprazole have been tried She states she has not taken her Metformin for about 2.5 weeks because she isn't eating like she used to.  States she has tried to take the Metformin but becomes nauseous if she does.  She states that her urine sometimes appears orange in color and also sometimes cloudy.  States there are some days when she feels better  She has had her gallbladder removed   States spicy foods seem to cause irritation, along with a peppermint patty a few days ago- states discomfort comes about 30 minutes afterward  She states she feels bloated after very little food and burps for quite some time after eating.   States she feels better with sitting upright than reclining      Medications: Outpatient Medications Prior to Visit  Medication Sig   hydrochlorothiazide (HYDRODIURIL) 25 MG tablet Take 1 tablet (25 mg total) by mouth daily. (Patient taking differently: Take 12.5 tablets by mouth daily.)   levothyroxine (SYNTHROID) 75 MCG tablet Take 1 tablet (75 mcg total) by mouth daily.   cholecalciferol (VITAMIN D) 1000 units tablet Take 1,000 Units by mouth daily. (Patient not taking: Reported on 08/04/2021)   metFORMIN (GLUCOPHAGE) 500 MG tablet TAKE 1 TABLET BY MOUTH TWICE DAILY WITH A MEAL (Patient not taking: Reported on 08/04/2021)   No facility-administered medications prior to visit.    Review of Systems  Constitutional:  Positive for appetite change.  Gastrointestinal:  Positive for abdominal distention, abdominal pain and nausea. Negative for blood in stool, constipation, diarrhea and vomiting.  Genitourinary:  Positive for hematuria.      Objective    BP 123/77 (BP Location: Left Arm, Patient Position: Sitting, Cuff Size: Normal)    Pulse 79    Temp (!) 97.5 F (36.4 C) (Oral)    Resp 16    Ht 5\' 3"  (1.6 m)    Wt 139 lb 14.4 oz (63.5 kg)    BMI 24.78 kg/m  {Show previous vital signs (optional):23777}  Physical Exam Vitals  reviewed.  Constitutional:      Appearance: Normal appearance.  HENT:     Head: Normocephalic and atraumatic.  Eyes:     Extraocular Movements: Extraocular movements intact.     Conjunctiva/sclera: Conjunctivae normal.  Cardiovascular:     Rate and Rhythm: Normal rate and regular rhythm.     Pulses: Normal pulses.     Heart sounds: Normal heart sounds.  Pulmonary:     Effort: Pulmonary effort is normal.     Breath sounds: Normal breath sounds. No wheezing, rhonchi or rales.  Abdominal:     General: Abdomen is flat. Bowel sounds are normal.     Palpations: Abdomen is soft.     Tenderness: There is no abdominal tenderness.  Neurological:     Mental Status: She is alert.      No results found for any visits on 08/04/21.  Assessment & Plan     Problem List Items Addressed This Visit       Digestive   Reflux gastritis    Acute, new problem Symptoms comprised of belching, abdominal discomfort, nausea that appear to be triggered by specific foods and worsens with reclining.  Discussed diet changes to prevent triggering symptoms in addition to medication management  Provided Famotidine 10 mg BID for controller therapy along with Sucralfate for PRN management  Recommend follow up in 4 weeks if symptoms are persisting  Discussed continuing Metformin after a few days of therapy to reduce discomfort and nausea.        Relevant Medications   sucralfate (CARAFATE) 1 g tablet   famotidine (PEPCID) 10 MG tablet     Endocrine   Diabetes mellitus without complication (HCC)    Chronic, stable She has stopped her Metformin due to nausea - likely secondary from reduced appetite due to reflux Recommend she use the prescribed medications - Famotidine and Sucralfate for symptom reduction for several days then restart Metformin If symptoms persist with management, will bring back to check CMP, A1c in case symptoms are related to DM.        Other Visit Diagnoses     Hematuria,  unspecified type    -  Primary Acute, new problem Patient has noticed changes to urine and was concerned for hematuria Confirmed with UA along with leukocytes.  Culture sent for abx guidance Will hold off sending in abx until culture returns due to current GI distress  Recommend increased hydration efforts at this time.     Relevant Orders   POCT urinalysis dipstick (Completed)   Urine Microscopic   Type 2 diabetes mellitus with other specified complication, without long-term current use of insulin (HCC)       Relevant Orders   Urine Albumin-Creatinine with uACR   Pain of upper abdomen     Acute, new problem, intermittent  Causing reduced appetite and nausea, has impacted her ability  to take metformin Suspect this is reflux and will treat accordingly  Trial with H2RA therapy and Sucralfate  Recommend return in 4 weeks if symptoms are not responsive     Relevant Medications   sucralfate (CARAFATE) 1 g tablet   famotidine (PEPCID) 10 MG tablet   Other Relevant Orders   Urine Culture        No follow-ups on file.   I, Adamaris King E Jarious Lyon, PA-C, have reviewed all documentation for this visit. The documentation on 08/04/21 for the exam, diagnosis, procedures, and orders are all accurate and complete.   Cristoval Teall, [place]ie [place] MPH [place], PA-C  [facility] [phone] (phone) [phone] (fax)  [place]

## 2021-08-04 NOTE — Assessment & Plan Note (Signed)
Chronic, stable She has stopped her Metformin due to nausea - likely secondary from reduced appetite due to reflux Recommend she use the prescribed medications - Famotidine and Sucralfate for symptom reduction for several days then restart Metformin If symptoms persist with management, will bring back to check CMP, A1c in case symptoms are related to DM.

## 2021-08-05 ENCOUNTER — Telehealth: Payer: Self-pay

## 2021-08-05 LAB — URINALYSIS, MICROSCOPIC ONLY
Bacteria, UA: NONE SEEN
Casts: NONE SEEN /lpf
Epithelial Cells (non renal): >10 /hpf — AB (ref 0–10)

## 2021-08-05 LAB — SPECIMEN STATUS REPORT

## 2021-08-05 LAB — MICROALBUMIN / CREATININE URINE RATIO
Creatinine, Urine: 192.6 mg/dL
Microalb/Creat Ratio: 10 mg/g creat (ref 0–29)
Microalbumin, Urine: 18.6 ug/mL

## 2021-08-05 NOTE — Telephone Encounter (Signed)
Patient advised and verbalized understanding 

## 2021-08-05 NOTE — Telephone Encounter (Signed)
-----   Message from Almon Register, PA-C sent at 08/05/2021 10:20 AM EST ----- Urinalysis was positive for blood and calcium oxalate crystals which could indicate that there are kidney stones in the urinary tract. Given the fact that patient mentioned she has transient urine color changes I think this is more likely. We can continue to follow up and if symptoms persist we can do some imaging to assess for stones in the kidneys. Will keep her updated with the results of the urine culture as well.

## 2021-08-05 NOTE — Telephone Encounter (Signed)
Patient advised. She wants to know if any treatment needs to be done for the kidney stones if she is not having any pain. Is there anything she needs to do or watch out for?

## 2021-08-08 LAB — URINE CULTURE

## 2021-08-08 LAB — SPECIMEN STATUS REPORT

## 2021-08-29 ENCOUNTER — Ambulatory Visit: Payer: Self-pay

## 2021-08-29 ENCOUNTER — Emergency Department: Payer: Medicare HMO

## 2021-08-29 ENCOUNTER — Ambulatory Visit (INDEPENDENT_AMBULATORY_CARE_PROVIDER_SITE_OTHER): Payer: Medicare HMO | Admitting: Family Medicine

## 2021-08-29 ENCOUNTER — Inpatient Hospital Stay
Admission: EM | Admit: 2021-08-29 | Discharge: 2021-09-14 | DRG: 326 | Disposition: A | Discharge status: Home / Self Care | Payer: Medicare HMO | Attending: Internal Medicine | Admitting: Internal Medicine

## 2021-08-29 ENCOUNTER — Encounter: Payer: Self-pay | Admitting: Emergency Medicine

## 2021-08-29 ENCOUNTER — Other Ambulatory Visit: Payer: Self-pay

## 2021-08-29 VITALS — BP 125/84 | HR 83 | Temp 97.9°F | Resp 16 | Wt 141.1 lb

## 2021-08-29 DIAGNOSIS — E1165 Type 2 diabetes mellitus with hyperglycemia: Secondary | ICD-10-CM | POA: Diagnosis not present

## 2021-08-29 DIAGNOSIS — C50912 Malignant neoplasm of unspecified site of left female breast: Secondary | ICD-10-CM | POA: Diagnosis present

## 2021-08-29 DIAGNOSIS — J9811 Atelectasis: Secondary | ICD-10-CM | POA: Diagnosis not present

## 2021-08-29 DIAGNOSIS — R21 Rash and other nonspecific skin eruption: Secondary | ICD-10-CM | POA: Diagnosis present

## 2021-08-29 DIAGNOSIS — R1011 Right upper quadrant pain: Secondary | ICD-10-CM

## 2021-08-29 DIAGNOSIS — E785 Hyperlipidemia, unspecified: Secondary | ICD-10-CM | POA: Diagnosis present

## 2021-08-29 DIAGNOSIS — R7989 Other specified abnormal findings of blood chemistry: Secondary | ICD-10-CM | POA: Diagnosis not present

## 2021-08-29 DIAGNOSIS — Z9049 Acquired absence of other specified parts of digestive tract: Secondary | ICD-10-CM | POA: Diagnosis not present

## 2021-08-29 DIAGNOSIS — C784 Secondary malignant neoplasm of small intestine: Secondary | ICD-10-CM | POA: Diagnosis not present

## 2021-08-29 DIAGNOSIS — Z823 Family history of stroke: Secondary | ICD-10-CM

## 2021-08-29 DIAGNOSIS — R1110 Vomiting, unspecified: Secondary | ICD-10-CM | POA: Diagnosis not present

## 2021-08-29 DIAGNOSIS — F419 Anxiety disorder, unspecified: Secondary | ICD-10-CM | POA: Diagnosis not present

## 2021-08-29 DIAGNOSIS — Z8249 Family history of ischemic heart disease and other diseases of the circulatory system: Secondary | ICD-10-CM

## 2021-08-29 DIAGNOSIS — K838 Other specified diseases of biliary tract: Secondary | ICD-10-CM | POA: Diagnosis not present

## 2021-08-29 DIAGNOSIS — I10 Essential (primary) hypertension: Secondary | ICD-10-CM | POA: Diagnosis not present

## 2021-08-29 DIAGNOSIS — Z7401 Bed confinement status: Secondary | ICD-10-CM | POA: Diagnosis not present

## 2021-08-29 DIAGNOSIS — K219 Gastro-esophageal reflux disease without esophagitis: Secondary | ICD-10-CM | POA: Diagnosis not present

## 2021-08-29 DIAGNOSIS — E876 Hypokalemia: Secondary | ICD-10-CM | POA: Diagnosis not present

## 2021-08-29 DIAGNOSIS — G20 Parkinson's disease: Secondary | ICD-10-CM | POA: Diagnosis not present

## 2021-08-29 DIAGNOSIS — Z4682 Encounter for fitting and adjustment of non-vascular catheter: Secondary | ICD-10-CM | POA: Diagnosis not present

## 2021-08-29 DIAGNOSIS — K3189 Other diseases of stomach and duodenum: Secondary | ICD-10-CM | POA: Diagnosis present

## 2021-08-29 DIAGNOSIS — Z01818 Encounter for other preprocedural examination: Secondary | ICD-10-CM | POA: Diagnosis not present

## 2021-08-29 DIAGNOSIS — K311 Adult hypertrophic pyloric stenosis: Secondary | ICD-10-CM | POA: Diagnosis present

## 2021-08-29 DIAGNOSIS — E039 Hypothyroidism, unspecified: Secondary | ICD-10-CM | POA: Diagnosis not present

## 2021-08-29 DIAGNOSIS — R748 Abnormal levels of other serum enzymes: Secondary | ICD-10-CM | POA: Diagnosis present

## 2021-08-29 DIAGNOSIS — N390 Urinary tract infection, site not specified: Secondary | ICD-10-CM | POA: Diagnosis present

## 2021-08-29 DIAGNOSIS — K831 Obstruction of bile duct: Secondary | ICD-10-CM

## 2021-08-29 DIAGNOSIS — K315 Obstruction of duodenum: Secondary | ICD-10-CM | POA: Diagnosis not present

## 2021-08-29 DIAGNOSIS — Z7189 Other specified counseling: Secondary | ICD-10-CM

## 2021-08-29 DIAGNOSIS — R17 Unspecified jaundice: Secondary | ICD-10-CM | POA: Diagnosis not present

## 2021-08-29 DIAGNOSIS — Z66 Do not resuscitate: Secondary | ICD-10-CM | POA: Diagnosis present

## 2021-08-29 DIAGNOSIS — Z79899 Other long term (current) drug therapy: Secondary | ICD-10-CM

## 2021-08-29 DIAGNOSIS — Z7984 Long term (current) use of oral hypoglycemic drugs: Secondary | ICD-10-CM

## 2021-08-29 DIAGNOSIS — R933 Abnormal findings on diagnostic imaging of other parts of digestive tract: Secondary | ICD-10-CM | POA: Diagnosis not present

## 2021-08-29 DIAGNOSIS — K639 Disease of intestine, unspecified: Secondary | ICD-10-CM | POA: Diagnosis not present

## 2021-08-29 DIAGNOSIS — Z91010 Allergy to peanuts: Secondary | ICD-10-CM

## 2021-08-29 DIAGNOSIS — Z833 Family history of diabetes mellitus: Secondary | ICD-10-CM

## 2021-08-29 DIAGNOSIS — N6320 Unspecified lump in the left breast, unspecified quadrant: Secondary | ICD-10-CM

## 2021-08-29 DIAGNOSIS — I152 Hypertension secondary to endocrine disorders: Secondary | ICD-10-CM | POA: Diagnosis present

## 2021-08-29 DIAGNOSIS — K6389 Other specified diseases of intestine: Secondary | ICD-10-CM | POA: Diagnosis not present

## 2021-08-29 DIAGNOSIS — Z79890 Hormone replacement therapy (postmenopausal): Secondary | ICD-10-CM

## 2021-08-29 DIAGNOSIS — Z0389 Encounter for observation for other suspected diseases and conditions ruled out: Secondary | ICD-10-CM | POA: Diagnosis not present

## 2021-08-29 DIAGNOSIS — C170 Malignant neoplasm of duodenum: Secondary | ICD-10-CM | POA: Diagnosis not present

## 2021-08-29 DIAGNOSIS — K5900 Constipation, unspecified: Secondary | ICD-10-CM | POA: Diagnosis not present

## 2021-08-29 DIAGNOSIS — R854 Abnormal immunological findings in specimens from digestive organs and abdominal cavity: Secondary | ICD-10-CM | POA: Diagnosis not present

## 2021-08-29 DIAGNOSIS — N6323 Unspecified lump in the left breast, lower outer quadrant: Secondary | ICD-10-CM | POA: Diagnosis not present

## 2021-08-29 DIAGNOSIS — Z515 Encounter for palliative care: Secondary | ICD-10-CM

## 2021-08-29 DIAGNOSIS — C50919 Malignant neoplasm of unspecified site of unspecified female breast: Secondary | ICD-10-CM | POA: Diagnosis not present

## 2021-08-29 DIAGNOSIS — R945 Abnormal results of liver function studies: Secondary | ICD-10-CM | POA: Diagnosis not present

## 2021-08-29 DIAGNOSIS — R6881 Early satiety: Secondary | ICD-10-CM | POA: Diagnosis not present

## 2021-08-29 DIAGNOSIS — K760 Fatty (change of) liver, not elsewhere classified: Secondary | ICD-10-CM | POA: Diagnosis not present

## 2021-08-29 DIAGNOSIS — R7401 Elevation of levels of liver transaminase levels: Principal | ICD-10-CM

## 2021-08-29 DIAGNOSIS — E1159 Type 2 diabetes mellitus with other circulatory complications: Secondary | ICD-10-CM | POA: Diagnosis present

## 2021-08-29 HISTORY — DX: Rash and other nonspecific skin eruption: R21

## 2021-08-29 HISTORY — DX: Unspecified jaundice: R17

## 2021-08-29 HISTORY — DX: Right upper quadrant pain: R10.11

## 2021-08-29 HISTORY — DX: Urinary tract infection, site not specified: N39.0

## 2021-08-29 LAB — COMPREHENSIVE METABOLIC PANEL
ALT: 162 U/L — ABNORMAL HIGH (ref 0–44)
AST: 96 U/L — ABNORMAL HIGH (ref 15–41)
Albumin: 3.7 g/dL (ref 3.5–5.0)
Alkaline Phosphatase: 186 U/L — ABNORMAL HIGH (ref 38–126)
Anion gap: 13 (ref 5–15)
BUN: 15 mg/dL (ref 8–23)
CO2: 29 mmol/L (ref 22–32)
Calcium: 9.6 mg/dL (ref 8.9–10.3)
Chloride: 90 mmol/L — ABNORMAL LOW (ref 98–111)
Creatinine, Ser: 0.72 mg/dL (ref 0.44–1.00)
GFR, Estimated: >60 mL/min (ref >60)
Glucose, Bld: 249 mg/dL — ABNORMAL HIGH (ref 70–99)
Potassium: 3.1 mmol/L — ABNORMAL LOW (ref 3.5–5.1)
Sodium: 132 mmol/L — ABNORMAL LOW (ref 135–145)
Total Bilirubin: 9.6 mg/dL — ABNORMAL HIGH (ref 0.3–1.2)
Total Protein: 7.6 g/dL (ref 6.5–8.1)

## 2021-08-29 LAB — URINALYSIS, COMPLETE (UACMP) WITH MICROSCOPIC
Glucose, UA: NEGATIVE mg/dL
Hgb urine dipstick: NEGATIVE
Ketones, ur: NEGATIVE mg/dL
Nitrite: NEGATIVE
Protein, ur: NEGATIVE mg/dL
Specific Gravity, Urine: 1.018 (ref 1.005–1.030)
WBC, UA: >50 WBC/hpf — ABNORMAL HIGH (ref 0–5)
pH: 7 (ref 5.0–8.0)

## 2021-08-29 LAB — CBC WITH DIFFERENTIAL/PLATELET
Abs Immature Granulocytes: 0.04 10*3/uL (ref 0.00–0.07)
Basophils Absolute: 0.1 10*3/uL (ref 0.0–0.1)
Basophils Relative: 1 %
Eosinophils Absolute: 0 10*3/uL (ref 0.0–0.5)
Eosinophils Relative: 0 %
HCT: 44.4 % (ref 36.0–46.0)
Hemoglobin: 15 g/dL (ref 12.0–15.0)
Immature Granulocytes: 0 %
Lymphocytes Relative: 13 %
Lymphs Abs: 1.3 10*3/uL (ref 0.7–4.0)
MCH: 29.5 pg (ref 26.0–34.0)
MCHC: 33.8 g/dL (ref 30.0–36.0)
MCV: 87.4 fL (ref 80.0–100.0)
Monocytes Absolute: 0.6 10*3/uL (ref 0.1–1.0)
Monocytes Relative: 6 %
Neutro Abs: 8.5 10*3/uL — ABNORMAL HIGH (ref 1.7–7.7)
Neutrophils Relative %: 80 %
Platelets: 293 10*3/uL (ref 150–400)
RBC: 5.08 MIL/uL (ref 3.87–5.11)
RDW: 15.8 % — ABNORMAL HIGH (ref 11.5–15.5)
WBC: 10.5 10*3/uL (ref 4.0–10.5)
nRBC: 0 % (ref 0.0–0.2)

## 2021-08-29 LAB — LACTIC ACID, PLASMA
Lactic Acid, Venous: 1.4 mmol/L (ref 0.5–1.9)
Lactic Acid, Venous: 1.7 mmol/L (ref 0.5–1.9)

## 2021-08-29 LAB — LIPASE, BLOOD: Lipase: 395 U/L — ABNORMAL HIGH (ref 11–51)

## 2021-08-29 LAB — GLUCOSE, CAPILLARY: Glucose-Capillary: 202 mg/dL — ABNORMAL HIGH (ref 70–99)

## 2021-08-29 MED ORDER — ONDANSETRON HCL 4 MG PO TABS: 4.0000 mg | ORAL_TABLET | Freq: Four times a day (QID) | ORAL | Status: DC | PRN

## 2021-08-29 MED ORDER — POTASSIUM CHLORIDE CRYS ER 20 MEQ PO TBCR
40.0000 meq | EXTENDED_RELEASE_TABLET | Freq: Once | ORAL | Status: AC
  Administered 2021-08-29: 40 meq via ORAL

## 2021-08-29 MED ORDER — SODIUM CHLORIDE 0.9 % IV SOLN
1.0000 g | Freq: Once | INTRAVENOUS | Status: AC
  Administered 2021-08-29: 1 g via INTRAVENOUS
  Filled 2021-08-29: qty 10

## 2021-08-29 MED ORDER — METOPROLOL TARTRATE 5 MG/5ML IV SOLN: 5.0000 mg | Freq: Four times a day (QID) | INTRAVENOUS | Status: DC | PRN

## 2021-08-29 MED ORDER — IOHEXOL 300 MG/ML  SOLN
100.0000 mL | Freq: Once | INTRAMUSCULAR | Status: AC | PRN
  Administered 2021-08-29: 100 mL via INTRAVENOUS

## 2021-08-29 MED ORDER — ONDANSETRON HCL 4 MG/2ML IJ SOLN
4.0000 mg | Freq: Four times a day (QID) | INTRAMUSCULAR | Status: DC | PRN
  Administered 2021-08-29 – 2021-09-08 (×17): 4 mg via INTRAVENOUS
  Filled 2021-08-29 (×17): qty 2

## 2021-08-29 MED ORDER — ACETAMINOPHEN 325 MG PO TABS: 650.0000 mg | ORAL_TABLET | Freq: Four times a day (QID) | ORAL | Status: DC | PRN

## 2021-08-29 MED ORDER — ACETAMINOPHEN 650 MG RE SUPP
650.0000 mg | Freq: Four times a day (QID) | RECTAL | Status: DC | PRN
Start: 2021-08-29 — End: 2021-09-14

## 2021-08-29 MED ORDER — SODIUM CHLORIDE 0.9 % IV SOLN: INTRAVENOUS | Status: DC

## 2021-08-29 MED ORDER — MORPHINE SULFATE (PF) 2 MG/ML IV SOLN
2.0000 mg | INTRAVENOUS | Status: DC | PRN
  Administered 2021-08-31 – 2021-09-02 (×4): 2 mg via INTRAVENOUS
  Filled 2021-08-29 (×4): qty 1

## 2021-08-29 MED ORDER — INSULIN ASPART 100 UNIT/ML IJ SOLN
0.0000 [IU] | INTRAMUSCULAR | Status: DC
  Administered 2021-08-29: 5 [IU] via SUBCUTANEOUS
  Administered 2021-08-30 (×3): 3 [IU] via SUBCUTANEOUS
  Administered 2021-08-30: 2 [IU] via SUBCUTANEOUS
  Administered 2021-08-31: 3 [IU] via SUBCUTANEOUS
  Administered 2021-08-31: 2 [IU] via SUBCUTANEOUS
  Administered 2021-09-01 – 2021-09-02 (×2): 3 [IU] via SUBCUTANEOUS
  Administered 2021-09-03: 2 [IU] via SUBCUTANEOUS
  Filled 2021-08-29 (×11): qty 1

## 2021-08-29 MED ORDER — LORAZEPAM 2 MG/ML IJ SOLN
2.0000 mg | Freq: Once | INTRAMUSCULAR | Status: DC
Start: 2021-08-29 — End: 2021-08-29

## 2021-08-29 MED ORDER — SODIUM CHLORIDE 0.9 % IV BOLUS
1000.0000 mL | Freq: Once | INTRAVENOUS | Status: AC
  Administered 2021-08-29: 1000 mL via INTRAVENOUS

## 2021-08-29 NOTE — H&P (Signed)
?History and Physical  ? ? ?Patient: Diamond Hinton ZOX:[id number] DOB: 31-Jan-1956 ?DOA: 08/29/2021 ?DOS: the patient was seen and examined on 08/29/2021 ?PCP: Virginia Crews, MD  ?Patient coming from: Home ? ?Chief Complaint:  ?Chief Complaint  ?Patient presents with  ? abnormal labs  ? ? ?HPI: Diamond Hinton is a 66 y.o. female with medical history significant for DM, HTN, hypothyroidism, prior cholecystectomy, sent in by with abnormal labs, notably elevated liver enzymes.  Patient had been having symptoms of gastric reflux for over a month and had been receiving treatment for possible reflux gastritis with famotidine, sucralfate but her symptoms had not improved.  She also reported persistent right upper quadrant pain, rash, yellowing of the skin and dark urine.  She presented to her PCP for follow-up on 3/20 and further evaluation with blood work revealed abnormal LFTs and she was thus referred to the ED. ?ED course: On arrival BP 152/91 with otherwise normal vitals.  Blood work concerning for AST/ALT 96/162 with alk phos of 186 and total bili 9.6.  Lipase 395.  Potassium 3.1 and glucose 249.  Normal renal function.  CBC mostly unremarkable with normal lactic acid.  Urinalysis showing large leukocyte esterases greater than 50 WBC per high-power field and rare bacteria.  CT abdomen and pelvis with contrast showed the following: ?IMPRESSION: ?There is 2.1 cm nodular density in the inferior aspect of left ?breast with slightly spiculated margins. Possibility of malignant ?neoplastic process in the left breast is not excluded. Please ?correlate with clinical physical examination findings and consider ?mammogram. ?  ?There is marked distention of stomach. Findings suggest possible ?benign or malignant stricture in the pylorus or proximal duodenum. ?Endoscopic correlation should be considered. ?  ?There is no evidence of any significant small bowel dilation or ?colonic distention. There is no hydronephrosis.  Appendix is not ?dilated. ?  ?Fatty liver. Status post cholecystectomy. There is marked dilation ?of intrahepatic and extrahepatic bile ducts. This may be related to ?previous cholecystectomy or suggest stricture in the distal common ?bile duct. Please correlate with laboratory findings and consider ?ERCP if warranted. ?  ?There is no hydronephrosis. ?  ?There are small linear densities in both lower lung fields ?suggesting subsegmental atelectasis and possibly scarring. ?  ?Other findings as described in the body of the report. ?  ? ?Patient was treated with IV Rocephin for possible UTI given a fluid bolus as well as an oral potassium dose.   ?The ED provider spoke with GI on-call, Dr. Alice Reichert who advised that GI Dr. Allen Norris would be available to perform ERCP in the a.m. Hospitalist consulted for admission.  ? ?Review of Systems: As mentioned in the history of present illness. All other systems reviewed and are negative. ?Past Medical History:  ?Diagnosis Date  ? Anemia   ? Anxiety   ? Gallstones   ? GERD (gastroesophageal reflux disease)   ? Hyperlipidemia   ? Hypertension   ? ?Past Surgical History:  ?Procedure Laterality Date  ? BILATERAL SALPINGECTOMY  04/03/1997  ? CHOLECYSTECTOMY N/A 07/07/2015  ? Procedure: LAPAROSCOPIC CHOLECYSTECTOMY ;  Surgeon: Jules Husbands, MD;  Location: [facility] ORS;  Service: General;  Laterality: N/A;  ? GALLBLADDER SURGERY  07/07/2015  ? [place]  ? ?Social History:  reports that she has never smoked. She has never used smokeless tobacco. She reports that she does not drink alcohol and does not use drugs. ? ?Allergies  ?Allergen Reactions  ? Peanuts [Peanut Oil] Shortness Of Breath and Itching  ? ? ?  Family History  ?Problem Relation Age of Onset  ? Hypertension Mother   ? CVA Mother   ? Ulcers Mother   ? Heart disease Father   ? Hypertension Sister   ? Diabetes Sister   ? Hypertension Sister   ? Diabetes Sister   ? ? ?Prior to Admission medications   ?Medication Sig Start Date End Date Taking?  Authorizing Provider  ?cholecalciferol (VITAMIN D) 1000 units tablet Take 1,000 Units by mouth daily. ?Patient not taking: Reported on 08/04/2021    [provider]  ?famotidine (PEPCID) 10 MG tablet Take 1 tablet (10 mg total) by mouth 2 (two) times daily. 08/04/21   Mecum, Erin E, PA-C  ?hydrochlorothiazide (HYDRODIURIL) 25 MG tablet Take 1 tablet (25 mg total) by mouth daily. ?Patient taking differently: Take 12.5 tablets by mouth daily. 10/12/20   Virginia Crews, MD  ?levothyroxine (SYNTHROID) 75 MCG tablet Take 1 tablet (75 mcg total) by mouth daily. 10/12/20   Virginia Crews, MD  ?metFORMIN (GLUCOPHAGE) 500 MG tablet TAKE 1 TABLET BY MOUTH TWICE DAILY WITH A MEAL ?Patient not taking: Reported on 08/04/2021 06/16/21   Virginia Crews, MD  ?sucralfate (CARAFATE) 1 g tablet Take 1 tablet (1 g total) by mouth 4 (four) times daily -  with meals and at bedtime. ?Patient not taking: Reported on 08/29/2021 08/04/21   Mecum, Dani Gobble, PA-C  ? ? ?Physical Exam: ?Vitals:  ? 08/29/21 1705 08/29/21 1706 08/29/21 1853 08/29/21 2000  ?BP: (!) 152/91  (!) 150/77 (!) 144/98  ?Pulse: 93  80 85  ?Resp: _0 ?Temp: 97.9 ?F (36.6 ?C)  98.1 ?F (36.7 ?C)   ?TempSrc: Oral  Oral   ?SpO2: 94%  98% 97%  ?Weight:  64 kg    ?Height:  5' 3.5" (1.613 m)    ? ?Physical Exam ?Vitals and nursing note reviewed.  ?Constitutional:   ?   General: She is not in acute distress. ?HENT:  ?   Head: Normocephalic and atraumatic.  ?Cardiovascular:  ?   Rate and Rhythm: Normal rate and regular rhythm.  ?   Pulses: Normal pulses.  ?   Heart sounds: Normal heart sounds.  ?Pulmonary:  ?   Effort: Pulmonary effort is normal.  ?   Breath sounds: Normal breath sounds.  ?Abdominal:  ?   Palpations: Abdomen is soft.  ?   Tenderness: There is no abdominal tenderness.  ?Neurological:  ?   Mental Status: Mental status is at baseline.  ? ? ? ?Data Reviewed: ?Relevant notes from primary care and specialist visits, past discharge summaries as  available in EHR, including Care Everywhere. ?Prior diagnostic testing as pertinent to current admission diagnoses ?Updated medications and problem lists for reconciliation ?ED course, including vitals, labs, imaging, treatment and response to treatment ?Triage notes, nursing and pharmacy notes and ED provider's notes ?Notable results as noted in HPI ? ? ?Assessment and Plan: ?* Obstructive jaundice ?Patient with jaundice, with history of gallstones and prior cholecystectomy, now with elevated LFTs and elevated lipase ?GI consult for ERCP in the a.m. ?N.p.o. and holding DVT chemoprophylaxis for procedure ?Pain control and antiemetics ?IV hydration ? ?Elevated lipase ?No focal abnormality seen in the pancreas on CT ?Suspect related to obstructive process ?Continue to trend if deemed necessary ? ?Gastric outlet obstruction ?Gastric distention with possible pyloric stenosis on CT ?We will keep n.p.o. ?NG tube if needed ?Aspiration precautions ?Management as above ?Protonix IV ? ?Uncontrolled type 2 diabetes mellitus with  hyperglycemia, without long-term current use of insulin ([place]) ?Sliding scale insulin coverage ? ?History of cholecystectomy ?Noted history in the setting of current suspected obstructive jaundice ? ?Left breast mass ?Spiculated mass left breast with recommendation for follow-up with mammogram ? ?UTI (urinary tract infection) ?IV Rocephin given from the ED ?Hold off on antibiotics for now pending culture and resume if positive culture ? ?Abnormal LFTs ?Hepatic steatosis on CT with dilatation of intra and extra biliary ducts ?Concern for malignant process ?Continue to trend ?For ERCP in the a.m. ? ?Hypokalemia ?IV repletion while n.p.o. and monitor ? ?Hypertension ?As needed IV hydralazine while n.p.o. ? ?Adult hypothyroidism ?We will hold off on levothyroxine until taking by mouth ? ? ?Advance Care Planning:   Code Status: Prior  ? ?Consults: Gi , Dr Alice Reichert ? ?Family Communication: none ? ?Severity of  Illness: ?The appropriate patient status for this patient is INPATIENT. Inpatient status is judged to be reasonable and necessary in order to provide the required intensity of service to ensure the patient's safety

## 2021-08-29 NOTE — Assessment & Plan Note (Addendum)
After discussion with diabetes coordinator, patient will be discharged without insulin.  She will check her sugars and then report in daily to myself, hospitalist attending to see if she needs to start on Lantus. ? ? ?

## 2021-08-29 NOTE — Assessment & Plan Note (Signed)
Acute onset, generalized upper body and face, excluding hands, into sclera of eyes ?Associated with RUQ worsening pain ?Recommend ER ASAP for concern for obstruction ?Complaints of poor appetite, low UOP and no BM ?

## 2021-08-29 NOTE — Assessment & Plan Note (Addendum)
Blood pressure remained stable during hospitalization ?

## 2021-08-29 NOTE — Assessment & Plan Note (Addendum)
Presented with jaundice, with history of prior cholecystectomy, with elevated LFTs, lipase. ?GI was consulted and recommended ERCP. ?ERCP was attempted on 3/21 but unable to be performed due to food and liquid residue within the stomach.  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  Initially requiring TPN.  Patient underwent gastrojejunostomy on 3/30.  Once gastric outlet obstruction bypassed, patient able to tolerate p.o. and by 4/5, tolerating soft diet.  Felt to be medically stable for discharge with plans to follow-up with general surgery. ? ?

## 2021-08-29 NOTE — Progress Notes (Signed)
?  ? ?I,Joseline E Rosas,acting as a scribe for Gwyneth Sprout, FNP.,have documented all relevant documentation on the behalf of Gwyneth Sprout, FNP,as directed by  Gwyneth Sprout, FNP while in the presence of Gwyneth Sprout, FNP. ? ? ?Established patient visit ? ? ?Patient: Diamond Hinton   DOB: Jan 03, 1956   66 y.o. Female  MRN: [id number] ?Visit Date: 08/29/2021 ? ?Today's healthcare provider: Gwyneth Sprout, FNP  ?Introduced to Designer, jewellery role and practice setting.  All questions answered.  Discussed provider/patient relationship and expectations. ? ? ?Chief Complaint  ?Patient presents with  ? Abdominal Pain  ? ?Subjective  ?  ?HPI  ?Follow up for Reflux gastritis: ? ?The patient was last seen for this 1 months ago. ?Changes made at last visit include; seen by [facility]. Provided Famotidine 10 mg BID for controller therapy along with Sucralfate for PRN management. Recommend follow up in 4 weeks if symptoms are persisting. Per patient she is not taking the Sucralfate. Report that she is not any better. She reports that after starting the medicines she started to feel better. Reports that she is not taking enough fluids.  ? ?She reports good compliance with treatment. ?She feels that condition is Worse. ?She is having side effects. Rash, "maybe from [facility]. She is currently in pain. Her urine is dark. ?No bowel movement for 3 days. Feeling nausea. Feels like she may be better off going to the ED. ?----------------------------------------------------------------------------------------- ? ? ?Medications: ?Outpatient Medications Prior to Visit  ?Medication Sig  ? famotidine (PEPCID) 10 MG tablet Take 1 tablet (10 mg total) by mouth 2 (two) times daily.  ? hydrochlorothiazide (HYDRODIURIL) 25 MG tablet Take 1 tablet (25 mg total) by mouth daily. (Patient taking differently: Take 12.5 tablets by mouth daily.)  ? levothyroxine (SYNTHROID) 75 MCG tablet Take 1 tablet (75 mcg total) by mouth daily.  ?  cholecalciferol (VITAMIN D) 1000 units tablet Take 1,000 Units by mouth daily. (Patient not taking: Reported on 08/04/2021)  ? metFORMIN (GLUCOPHAGE) 500 MG tablet TAKE 1 TABLET BY MOUTH TWICE DAILY WITH A MEAL (Patient not taking: Reported on 08/04/2021)  ? sucralfate (CARAFATE) 1 g tablet Take 1 tablet (1 g total) by mouth 4 (four) times daily -  with meals and at bedtime. (Patient not taking: Reported on 08/29/2021)  ? ?No facility-administered medications prior to visit.  ? ? ?Review of Systems  ?Constitutional:  Negative for appetite change, chills, fatigue and fever.  ?Respiratory:  Negative for chest tightness and shortness of breath.   ?Cardiovascular:  Negative for chest pain and palpitations.  ?Gastrointestinal:  Negative for abdominal pain, nausea and vomiting.  ?Neurological:  Negative for dizziness and weakness.  ? ? ?  Objective  ?  ?BP 125/84 (BP Location: Left Arm, Patient Position: Sitting, Cuff Size: Normal)   Pulse 83   Temp 97.9 ?F (36.6 ?C) (Temporal)   Resp 16   Wt 141 lb 1.6 oz (64 kg)   BMI 24.99 kg/m?  ?BP Readings from Last 3 Encounters:  ?08/29/21 125/84  ?08/04/21 123/77  ?04/18/21 132/86  ? ?Wt Readings from Last 3 Encounters:  ?08/29/21 141 lb 1.6 oz (64 kg)  ?08/04/21 139 lb 14.4 oz (63.5 kg)  ?04/18/21 142 lb 6.4 oz (64.6 kg)  ? ?  ? ?Physical Exam ?Vitals and nursing note reviewed.  ?Constitutional:   ?   General: She is not in acute distress. ?   Appearance: Normal appearance. She is overweight. She is ill-appearing  and toxic-appearing. She is not diaphoretic.  ?HENT:  ?   Head: Normocephalic and atraumatic.  ?Cardiovascular:  ?   Rate and Rhythm: Normal rate and regular rhythm.  ?   Pulses: Normal pulses.  ?   Heart sounds: Normal heart sounds. No murmur heard. ?  No friction rub. No gallop.  ?Pulmonary:  ?   Effort: Pulmonary effort is normal. No respiratory distress.  ?   Breath sounds: Normal breath sounds. No stridor. No wheezing, rhonchi or rales.  ?Chest:  ?   Chest wall:  No tenderness.  ?Abdominal:  ?   General: Bowel sounds are decreased.  ?   Palpations: There is hepatomegaly.  ?   Tenderness: There is generalized abdominal tenderness and tenderness in the right upper quadrant. There is guarding. There is no right CVA tenderness or left CVA tenderness.  ?Musculoskeletal:     ?   General: No swelling, tenderness, deformity or signs of injury. Normal range of motion.  ?   Right lower leg: No edema.  ?   Left lower leg: No edema.  ?Skin: ?   General: Skin is warm and dry.  ?   Capillary Refill: Capillary refill takes less than 2 seconds.  ?   Coloration: Skin is ashen and jaundiced. Skin is not pale.  ?   Findings: Erythema and rash present. No bruising or lesion. Rash is macular, papular and urticarial. Rash is not crusting, nodular, purpuric, pustular, scaling or vesicular.  ? ?    ?Neurological:  ?   General: No focal deficit present.  ?   Mental Status: She is alert and oriented to person, place, and time. Mental status is at baseline.  ?   Cranial Nerves: No cranial nerve deficit.  ?   Sensory: No sensory deficit.  ?   Motor: No weakness.  ?   Coordination: Coordination normal.  ?Psychiatric:     ?   Mood and Affect: Mood normal.     ?   Behavior: Behavior normal.     ?   Thought Content: Thought content normal.     ?   Judgment: Judgment normal.  ?  ? ?No results found for any visits on 08/29/21. ? Assessment & Plan  ?  ? ?Problem List Items Addressed This Visit   ? ?  ? Musculoskeletal and Integument  ? Rash  ?  Acute, new onset ?Macular, papular, red/raised, itchy ?Has not tried OTC ?Not likely a drug rash as patient suspects, likely d/t failure of liver clearance ?  ?  ?  ? Other  ? Jaundice - Primary  ?  Acute onset, generalized upper body and face, excluding hands, into sclera of eyes ?Associated with RUQ worsening pain ?Recommend ER ASAP for concern for obstruction ?Complaints of poor appetite, low UOP and no BM ?  ?  ? Right upper quadrant abdominal pain  ?  Acute,  worsening ?Was seen 1 month ago for concern for gastritis ?Did not improve; now associated with jaundice ?Refer to ER ?Hx of gallbladder removal ?Reports worsening symptoms since starting Metformin last year ?  ?  ? ? ? ?Return in about 2 months (around 10/29/2021) for annual examination.  ?   ? ?I, Gwyneth Sprout, FNP, have reviewed all documentation for this visit. The documentation on 08/29/21 for the exam, diagnosis, procedures, and orders are all accurate and complete. ? ? ? ?Gwyneth Sprout, FNP  ?[place] ?[phone] (phone) ?[phone] (fax) ? ?[facility] ?

## 2021-08-29 NOTE — Assessment & Plan Note (Addendum)
On IV levothyroxine while n.p.o. ?Resumed on p.o. Synthroid once able to take p.o. when tolerating. ?

## 2021-08-29 NOTE — Assessment & Plan Note (Signed)
Noted history in the setting of current suspected obstructive jaundice ?

## 2021-08-29 NOTE — Assessment & Plan Note (Addendum)
Due to metastatic breast cancer. ?MRCP on 3/21 showed masslike duodenal wall thickening extending to the ampulla, worrisome for duodenal/ampullary carcinoma. ...intrahepatic/extrahepatic ductal dilatation.... prominent pancreatic duct, extending to the ampulla... 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   ?--2.3 cm irregular/spiculated lesion in the left inferior breast, suspicious for primary breast neoplasm.  ? ?General surgery placed G-tube and able to relieve obstruction.  Patient able to start taking p.o. ?

## 2021-08-29 NOTE — Assessment & Plan Note (Addendum)
New diagnosis this admission.  Presented with biliary obstructive symptoms and noted to have spiculated mass in left breast on imaging upon admission.  Biopsied during attempted ERCP and pathology has returned showing invasive lobular carcinoma.  Seen by oncology who plan to start chemotherapy as outpatient. ?

## 2021-08-29 NOTE — Assessment & Plan Note (Addendum)
No focal abnormality seen in the pancreas on CT.  Suspect related to obstructive process. ?-- Biliary drain placed by IR on 3/22 ?

## 2021-08-29 NOTE — Assessment & Plan Note (Signed)
Acute, new onset ?Macular, papular, red/raised, itchy ?Has not tried OTC ?Not likely a drug rash as patient suspects, likely d/t failure of liver clearance ?

## 2021-08-29 NOTE — ED Triage Notes (Signed)
Pt to the ED via POV with c/o abnormal labs and "liver congestion" She started out having issues with reflux and has been followed by her PMD, she was starting to feel better until yesterday, she went back to her PMD, they sent her here because she is jaundice.  ?

## 2021-08-29 NOTE — Telephone Encounter (Signed)
?  Chief Complaint: Abdominal pain/unable to eat/gluten intolerance/ bloating/dark urine/Allergic reaction to medication ?Symptoms: IBID ?Frequency: Ongoing since 06/2021 ?Pertinent Negatives: Patient denies SOB ?Disposition: '[]'$ ED /'[]'$ Urgent Care (no appt availability in office) / '[x]'$ Appointment(In office/virtual)/ '[]'$  [place] [facility]/ '[]'$ Home Care/ '[]'$ Refused Recommended Disposition /'[]'$ [place] [facility]/ '[]'$  Follow-up with PCP ?Additional Notes: Pt was seen for similar issues recently. Pt states that she had an allergic reaction to Carafate - itching and hives.  Dark urine. Not eating. ? ? ?Reason for Disposition ? [1] MODERATE pain (e.g., interferes with normal activities) AND [2] pain comes and goes (cramps) AND [3] present > 24 hours  (Exception: pain with Vomiting or Diarrhea - see that Guideline) ? ?Answer Assessment - Initial Assessment Questions ?1. LOCATION: "Where does it hurt?"  ?    Left right to left side - above the belly button ?2. RADIATION: "Does the pain shoot anywhere else?" (e.g., chest, back) ?    no ?3. ONSET: "When did the pain begin?" (e.g., minutes, hours or days ago)  ?    January ?4. SUDDEN: "Gradual or sudden onset?" ?    ongoing ?5. PATTERN "Does the pain come and go, or is it constant?" ?   - If constant: "Is it getting better, staying the same, or worsening?"  ?    (Note: Constant means the pain never goes away completely; most serious pain is constant and it progresses)  ?   - If intermittent: "How long does it last?" "Do you have pain now?" ?    (Note: Intermittent means the pain goes away completely between bouts) ?    iintermittent ?6. SEVERITY: "How bad is the pain?"  (e.g., Scale 1-10; mild, moderate, or severe) ?  - MILD (1-3): doesn't interfere with normal activities, abdomen soft and not tender to touch  ?  - MODERATE (4-7): interferes with normal activities or awakens from sleep, abdomen tender to touch  ?  - SEVERE (8-10): excruciating pain, doubled over, unable to do  any normal activities  ?    4 ?7. RECURRENT SYMPTOM: "Have you ever had this type of stomach pain before?" If Yes, ask: "When was the last time?" and "What happened that time?"  ?    Yes - ongoing ?8. CAUSE: "What do you think is causing the stomach pain?" ?    Gluten - unsure ?9. RELIEVING/AGGRAVATING FACTORS: "What makes it better or worse?" (e.g., movement, antacids, bowel movement) ?   Laying left side down ?10. OTHER SYMPTOMS: "Do you have any other symptoms?" (e.g., back pain, diarrhea, fever, urination pain, vomiting) ?      vomiting ?11. PREGNANCY: "Is there any chance you are pregnant?" "When was your last menstrual period?" ?      na ? ?Protocols used: Abdominal Pain - Female-A-AH ? ?

## 2021-08-29 NOTE — ED Provider Triage Note (Signed)
Emergency Medicine Provider Triage Evaluation Note ? ?Diamond Hinton , a 66 y.o. female  was evaluated in triage.  Pt complains of abdominal distention, jaundice, intermittent abdominal pain for 1 to 2 weeks.  No nausea or vomiting.  Currently without pain.  Referred by PCP today.. ? ?Review of Systems  ?Positive: Abdominal distention, jaundice, intermittent abdominal pain, dark urine ?Negative: Fevers, nausea, vomiting, ? ?Physical Exam  ?BP (!) 152/91 (BP Location: Left Arm)   Pulse 93   Temp 97.9 ?F (36.6 ?C) (Oral)   Resp 16   SpO2 94%  ?Gen:   Awake, no distress   ?Resp:  Normal effort  ?MSK:   Moves extremities without difficulty  ?Other:  Mild abdominal distention ? ?Medical Decision Making  ?Medically screening exam initiated at 5:06 PM.  Appropriate orders placed.  Diamond Hinton was informed that the remainder of the evaluation will be completed by another provider, this initial triage assessment does not replace that evaluation, and the importance of remaining in the ED until their evaluation is complete. ? ? ?  ?Duanne Guess, PA-C ?08/29/21 1709 ? ?

## 2021-08-29 NOTE — Assessment & Plan Note (Signed)
Acute, worsening ?Was seen 1 month ago for concern for gastritis ?Did not improve; now associated with jaundice ?Refer to ER ?Hx of gallbladder removal ?Reports worsening symptoms since starting Metformin last year ?

## 2021-08-29 NOTE — Assessment & Plan Note (Addendum)
Ongoing replacement as needed. ? ?

## 2021-08-29 NOTE — Assessment & Plan Note (Addendum)
Hepatic steatosis on CT with dilatation of intra and extra biliary ducts.   ?Concern for malignant process, as outlined. ?Improving since biliary drain placement ?--Follow CMP ?

## 2021-08-29 NOTE — ED Provider Notes (Signed)
? ?[facility] ?Provider Note ? ? ? Event Date/Time  ? First MD Initiated Contact with Patient 08/29/21 1718   ?  (approximate) ? ? ?History  ? ?Chief Complaint ?abnormal labs ? ? ?HPI ?Diamond Hinton is a 66 y.o. female, history of hyperlipidemia, hypothyroidism, diabetes, hypertension, anxiety, gallstones, presents to the emergency department for evaluation of abnormal labs.  Patient states that she has been experiencing a yellowing in her skin over the past week.  In addition endorses abdominal distention for the past 2 months, particularly after eating.  In addition, she has also been noticing a darkening color in her urine.  She went to her primary care provider today, who stated that she was concerned for congestion in her liver and sent her to the emergency department.  Patient denies fever/chills, abdominal pain, back pain, nausea/vomiting, dysuria, rashes, headache, chest pain, or shortness of breath ? ?History Limitations: No limitations. ? ?  ? ? ?Physical Exam  ?Triage Vital Signs: ?ED Triage Vitals  ?Enc Vitals Group  ?   BP 08/29/21 1705 (!) 152/91  ?   Pulse Rate 08/29/21 1705 93  ?   Resp 08/29/21 1705 16  ?   Temp 08/29/21 1705 97.9 ?F (36.6 ?C)  ?   Temp Source 08/29/21 1705 Oral  ?   SpO2 08/29/21 1705 94 %  ?   Weight 08/29/21 1706 141 lb (64 kg)  ?   Height 08/29/21 1706 5' 3.5" (1.613 m)  ?   Head Circumference --   ?   Peak Flow --   ?   Pain Score 08/29/21 1705 0  ?   Pain Loc --   ?   Pain Edu? --   ?   Excl. in [place]? --   ? ? ?Most recent vital signs: ?Vitals:  ? 08/29/21 1853 08/29/21 2000  ?BP: (!) 150/77 (!) 144/98  ?Pulse: 80 85  ?Resp: 17 17  ?Temp: 98.1 ?F (36.7 ?C)   ?SpO2: 98% 97%  ? ? ?General: Awake, NAD.  ?Skin: Warm, dry.  Noticeably jaundiced skin tone. ?CV: Good peripheral perfusion.  ?Resp: Normal effort.  Lung sounds clear bilaterally. ?Abd: Mildly distended diffusely, no noticeable masses.  No tenderness. ?Neuro: At baseline. No gross neurological  deficits.  ?Other: Scleral icterus present. ? ?Physical Exam ? ? ? ?ED Results / Procedures / Treatments  ?Labs ?(all labs ordered are listed, but only abnormal results are displayed) ?Labs Reviewed  ?CBC WITH DIFFERENTIAL/PLATELET - Abnormal; Notable for the following components:  ?    Result Value  ? RDW 15.8 (*)   ? Neutro Abs 8.5 (*)   ? All other components within normal limits  ?COMPREHENSIVE METABOLIC PANEL - Abnormal; Notable for the following components:  ? Sodium 132 (*)   ? Potassium 3.1 (*)   ? Chloride 90 (*)   ? Glucose, Bld 249 (*)   ? AST 96 (*)   ? ALT 162 (*)   ? Alkaline Phosphatase 186 (*)   ? Total Bilirubin 9.6 (*)   ? All other components within normal limits  ?URINALYSIS, COMPLETE (UACMP) WITH MICROSCOPIC - Abnormal; Notable for the following components:  ? Color, Urine AMBER (*)   ? APPearance HAZY (*)   ? Bilirubin Urine MODERATE (*)   ? Leukocytes,Ua LARGE (*)   ? WBC, UA >50 (*)   ? Bacteria, UA RARE (*)   ? All other components within normal limits  ?LIPASE, BLOOD - Abnormal; Notable for the  following components:  ? Lipase 395 (*)   ? All other components within normal limits  ?LACTIC ACID, PLASMA  ?LACTIC ACID, PLASMA  ? ? ? ?EKG ?Not applicable. ? ? ?RADIOLOGY ? ?ED Provider Interpretation: I personally reviewed and interpreted the CT.  Severely distended stomach, distention noted in the common bile duct. ? ?CT Abdomen Pelvis W Contrast ? ?Result Date: 08/29/2021 ?CLINICAL DATA:  Abdominal pain EXAM: CT ABDOMEN AND PELVIS WITH CONTRAST TECHNIQUE: Multidetector CT imaging of the abdomen and pelvis was performed using the standard protocol following bolus administration of intravenous contrast. RADIATION DOSE REDUCTION: This exam was performed according to the departmental dose-optimization program which includes automated exposure control, adjustment of the mA and/or kV according to patient size and/or use of iterative reconstruction technique. CONTRAST:  144m OMNIPAQUE IOHEXOL 300  MG/ML  SOLN COMPARISON:  Abdominal sonogram done on 07/06/2015 FINDINGS: Lower chest: There are small linear densities in the lower lung fields suggesting subsegmental atelectasis. There is 2.1 cm nodular density with slightly spiculated margins in the inferior lateral aspect of left breast. Hepatobiliary: There is fatty infiltration in the liver. There is dilation of intrahepatic and extrahepatic bile ducts. Distal common bile duct in the head of the pancreas measures proximally 1.7 cm. There is no definite demonstrable intraluminal filling defect in the bile ducts. Surgical clips are seen in gallbladder fossa. Pancreas: There is moderate to marked dilation of pancreatic duct. No focal abnormality is seen. Spleen: Unremarkable. Adrenals/Urinary Tract: Adrenals are unremarkable. There is no hydronephrosis. There are no renal or ureteral stones. Urinary bladder is unremarkable. Stomach/Bowel: There is marked distention of stomach. There is large amount of fluid in the lumen of the stomach. There are high density foci in the dependent portion of stomach lumen, possibly oral medication. Duodenum is not distended. Small bowel loops are unremarkable. Appendix is not dilated. There is no significant wall thickening in colon. There is no pericolic stranding. Scattered diverticula are seen without signs of focal acute diverticulitis. Vascular/Lymphatic: Arterial calcifications and atherosclerotic plaques are seen in the aorta and its major branches. Reproductive: Unremarkable. Other: There is no ascites or pneumoperitoneum. Small umbilical hernia containing fat is seen. Musculoskeletal: Degenerative changes are noted in the lumbar spine, particularly severe at L4-L5 and L5-S1 levels with encroachment of neural foramina, more severe on the left side at L4-L5 level. IMPRESSION: There is 2.1 cm nodular density in the inferior aspect of left breast with slightly spiculated margins. Possibility of malignant neoplastic process in  the left breast is not excluded. Please correlate with clinical physical examination findings and consider mammogram. There is marked distention of stomach. Findings suggest possible benign or malignant stricture in the pylorus or proximal duodenum. Endoscopic correlation should be considered. There is no evidence of any significant small bowel dilation or colonic distention. There is no hydronephrosis. Appendix is not dilated. Fatty liver. Status post cholecystectomy. There is marked dilation of intrahepatic and extrahepatic bile ducts. This may be related to previous cholecystectomy or suggest stricture in the distal common bile duct. Please correlate with laboratory findings and consider ERCP if warranted. There is no hydronephrosis. There are small linear densities in both lower lung fields suggesting subsegmental atelectasis and possibly scarring. Other findings as described in the body of the report. Electronically Signed   By: PElmer PickerM.D.   On: 08/29/2021 19:01   ? ?PROCEDURES: ? ?Critical Care performed: None. ? ?Procedures ? ? ? ?MEDICATIONS ORDERED IN ED: ?Medications  ?cefTRIAXone (ROCEPHIN) 1 g in sodium chloride  0.9 % 100 mL IVPB (1 g Intravenous New Bag/Given 08/29/21 1954)  ?sodium chloride 0.9 % bolus 1,000 mL (0 mLs Intravenous Stopped 08/29/21 1957)  ?potassium chloride SA (KLOR-CON M) CR tablet 40 mEq (40 mEq Oral Given 08/29/21 1846)  ?iohexol (OMNIPAQUE) 300 MG/ML solution 100 mL (100 mLs Intravenous Contrast Given 08/29/21 1830)  ? ? ? ?IMPRESSION / MDM / ASSESSMENT AND PLAN / ED COURSE  ?I reviewed the triage vital signs and the nursing notes. ?             ?               ? ?Differential diagnosis includes, but is not limited to, pancreatitis, biliary tract obstruction, hepatitis, adverse drug reaction, jaundice ? ?ED Course ?Patient appears well.  Vital signs within normal limits.  NAD. ? ?CBC shows no evidence of leukocytosis or anemia. ? ?CMP notable for hypokalemia at 3.1.  We  will go ahead and provide p.o. potassium supplementation.  Glucose is also notably elevated at 249.  Transaminitis present with elevated AST at 96, ALT 162.  Bilirubin 9.6 ? ?Lipase notably elevated at 395, susp

## 2021-08-29 NOTE — Assessment & Plan Note (Addendum)
Patient treated with Rocephin in the ED. ?She denies any urinary symptoms. ?Has been on Zosyn to prevent cholangitis, which was discontinued on 3/28. ?

## 2021-08-30 ENCOUNTER — Inpatient Hospital Stay: Payer: Medicare HMO

## 2021-08-30 ENCOUNTER — Encounter
Admission: EM | Disposition: A | Discharge status: Home / Self Care | Payer: Self-pay | Source: Home / Self Care | Attending: Internal Medicine

## 2021-08-30 ENCOUNTER — Inpatient Hospital Stay: Payer: Medicare HMO | Admitting: Anesthesiology

## 2021-08-30 ENCOUNTER — Encounter: Payer: Self-pay | Admitting: Internal Medicine

## 2021-08-30 DIAGNOSIS — K831 Obstruction of bile duct: Secondary | ICD-10-CM | POA: Diagnosis not present

## 2021-08-30 DIAGNOSIS — K315 Obstruction of duodenum: Secondary | ICD-10-CM | POA: Diagnosis present

## 2021-08-30 DIAGNOSIS — R854 Abnormal immunological findings in specimens from digestive organs and abdominal cavity: Secondary | ICD-10-CM | POA: Diagnosis not present

## 2021-08-30 DIAGNOSIS — R17 Unspecified jaundice: Secondary | ICD-10-CM | POA: Diagnosis not present

## 2021-08-30 HISTORY — PX: ESOPHAGOGASTRODUODENOSCOPY: SHX5428

## 2021-08-30 LAB — GLUCOSE, CAPILLARY
Glucose-Capillary: 133 mg/dL — ABNORMAL HIGH (ref 70–99)
Glucose-Capillary: 144 mg/dL — ABNORMAL HIGH (ref 70–99)
Glucose-Capillary: 160 mg/dL — ABNORMAL HIGH (ref 70–99)
Glucose-Capillary: 183 mg/dL — ABNORMAL HIGH (ref 70–99)
Glucose-Capillary: 196 mg/dL — ABNORMAL HIGH (ref 70–99)
Glucose-Capillary: 227 mg/dL — ABNORMAL HIGH (ref 70–99)

## 2021-08-30 LAB — CBC
HCT: 38.8 % (ref 36.0–46.0)
Hemoglobin: 12.9 g/dL (ref 12.0–15.0)
MCH: 29.5 pg (ref 26.0–34.0)
MCHC: 33.2 g/dL (ref 30.0–36.0)
MCV: 88.6 fL (ref 80.0–100.0)
Platelets: 220 10*3/uL (ref 150–400)
RBC: 4.38 MIL/uL (ref 3.87–5.11)
RDW: 15.9 % — ABNORMAL HIGH (ref 11.5–15.5)
WBC: 8.2 10*3/uL (ref 4.0–10.5)
nRBC: 0 % (ref 0.0–0.2)

## 2021-08-30 LAB — COMPREHENSIVE METABOLIC PANEL
ALT: 127 U/L — ABNORMAL HIGH (ref 0–44)
AST: 80 U/L — ABNORMAL HIGH (ref 15–41)
Albumin: 3 g/dL — ABNORMAL LOW (ref 3.5–5.0)
Alkaline Phosphatase: 149 U/L — ABNORMAL HIGH (ref 38–126)
Anion gap: 6 (ref 5–15)
BUN: 12 mg/dL (ref 8–23)
CO2: 28 mmol/L (ref 22–32)
Calcium: 8.8 mg/dL — ABNORMAL LOW (ref 8.9–10.3)
Chloride: 103 mmol/L (ref 98–111)
Creatinine, Ser: 0.65 mg/dL (ref 0.44–1.00)
GFR, Estimated: >60 mL/min (ref >60)
Glucose, Bld: 165 mg/dL — ABNORMAL HIGH (ref 70–99)
Potassium: 3.4 mmol/L — ABNORMAL LOW (ref 3.5–5.1)
Sodium: 137 mmol/L (ref 135–145)
Total Bilirubin: 7.4 mg/dL — ABNORMAL HIGH (ref 0.3–1.2)
Total Protein: 6 g/dL — ABNORMAL LOW (ref 6.5–8.1)

## 2021-08-30 LAB — HIV ANTIBODY (ROUTINE TESTING W REFLEX): HIV Screen 4th Generation wRfx: NONREACTIVE

## 2021-08-30 SURGERY — EGD (ESOPHAGOGASTRODUODENOSCOPY)
Anesthesia: General

## 2021-08-30 MED ORDER — SUCCINYLCHOLINE CHLORIDE 200 MG/10ML IV SOSY
PREFILLED_SYRINGE | INTRAVENOUS | Status: AC
Start: 2021-08-30 — End: ?
  Filled 2021-08-30: qty 10

## 2021-08-30 MED ORDER — SUCCINYLCHOLINE CHLORIDE 200 MG/10ML IV SOSY
PREFILLED_SYRINGE | INTRAVENOUS | Status: DC | PRN
  Administered 2021-08-30: 100 mg via INTRAVENOUS

## 2021-08-30 MED ORDER — INDOMETHACIN 50 MG RE SUPP
RECTAL | Status: AC
  Filled 2021-08-30: qty 2

## 2021-08-30 MED ORDER — LIDOCAINE HCL (CARDIAC) PF 100 MG/5ML IV SOSY
PREFILLED_SYRINGE | INTRAVENOUS | Status: DC | PRN
  Administered 2021-08-30: 100 mg via INTRAVENOUS

## 2021-08-30 MED ORDER — PROPOFOL 10 MG/ML IV BOLUS
INTRAVENOUS | Status: DC | PRN
  Administered 2021-08-30: 150 mg via INTRAVENOUS

## 2021-08-30 MED ORDER — FENTANYL CITRATE (PF) 100 MCG/2ML IJ SOLN
INTRAMUSCULAR | Status: DC | PRN
  Administered 2021-08-30: 50 ug via INTRAVENOUS

## 2021-08-30 MED ORDER — INSULIN GLARGINE-YFGN 100 UNIT/ML ~~LOC~~ SOLN
8.0000 [IU] | Freq: Every day | SUBCUTANEOUS | Status: DC
  Administered 2021-08-30 – 2021-09-04 (×6): 8 [IU] via SUBCUTANEOUS
  Filled 2021-08-30 (×7): qty 0.08

## 2021-08-30 MED ORDER — SODIUM CHLORIDE 0.9 % IV SOLN: INTRAVENOUS | Status: DC

## 2021-08-30 MED ORDER — FENTANYL CITRATE (PF) 100 MCG/2ML IJ SOLN
INTRAMUSCULAR | Status: AC
Start: 2021-08-30 — End: ?
  Filled 2021-08-30: qty 2

## 2021-08-30 MED ORDER — MIDAZOLAM HCL 5 MG/5ML IJ SOLN
INTRAMUSCULAR | Status: DC | PRN
  Administered 2021-08-30: 2 mg via INTRAVENOUS

## 2021-08-30 MED ORDER — MIDAZOLAM HCL 2 MG/2ML IJ SOLN
INTRAMUSCULAR | Status: AC
  Filled 2021-08-30: qty 2

## 2021-08-30 MED ORDER — GLYCOPYRROLATE 0.2 MG/ML IJ SOLN
INTRAMUSCULAR | Status: DC | PRN
  Administered 2021-08-30: .2 mg via INTRAVENOUS

## 2021-08-30 MED ORDER — ONDANSETRON HCL 4 MG/2ML IJ SOLN
INTRAMUSCULAR | Status: DC | PRN
  Administered 2021-08-30: 4 mg via INTRAVENOUS

## 2021-08-30 MED ORDER — DEXAMETHASONE SODIUM PHOSPHATE 10 MG/ML IJ SOLN
INTRAMUSCULAR | Status: DC | PRN
  Administered 2021-08-30: 10 mg via INTRAVENOUS

## 2021-08-30 NOTE — Progress Notes (Signed)
? ? ?  GI Inpatient Short Progress Note ? ?ERCP was unable to be performed this afternoon by Dr. Allen Norris b/c there was large amount of food and liquid residue in entire examined stomach. 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 We will proceed with MRI/MRCP for visualization of her biliary tree and pancreas to rule out neoplasm. We will place orders for NGT placement to low intermittent wall suction. Remain NPO. I have added on some blood work for morning draw - AFP and CA 19-9. We will await biopsies and monitor her clinically and trend LFTs. Keep HOB elevated 30 degrees given risk of aspiration. Dr. Virgina Jock and I will be following over the next few days.  ? ? ?Geanie Kenning, PA-C ?[facility] ?[phone] ?[phone] (Cell) ? ?

## 2021-08-30 NOTE — Plan of Care (Signed)

## 2021-08-30 NOTE — Hospital Course (Addendum)
66 year old female with past medical history significant for diabetes, hypertension, hypothyroidism sent to the emergency room on 3/20 by her PCP for abnormal labs.  In the emergency room, lab work noted transaminitis, elevated bilirubin as well as a large urinary tract infection.  CT abdomen and pelvis shows marked distention of the stomach finding suggestive of possible benign or malignant stricture. Left breast mass with slightly spiculated margins also noted. ? ?GI was consulted.  They attempted ERCP on 3/21, but unable to proceed due to food and liquid residue in the stomach.  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  IR consulted for biliary drain placement on 3/22, but unable to advance drain past sphincter of Oddi.  Returned to IR on 3/24 for advancement of drain.  Preliminary biopsy results showing this is likely metastatic breast cancer.  Patient started on TPN.  On 3/30, patient underwent robotic assisted laparoscopic gastrojejunostomy.  Oncology who plan to see patient in office and start chemotherapy.  Once obstruction resolves, patient was slowly started back on p.o. and by 4/5, able to tolerate p.o. ?

## 2021-08-30 NOTE — Progress Notes (Signed)
Inpatient Diabetes Program Recommendations ? ?AACE/ADA: New Consensus Statement on Inpatient Glycemic Control (2015) ? ?Target Ranges:  Prepandial:   less than 140 mg/dL ?     Peak postprandial:   less than 180 mg/dL (1-2 hours) ?     Critically ill patients:  140 - 180 mg/dL  ? ?Lab Results  ?Component Value Date  ? GLUCAP 183 (H) 08/30/2021  ? HGBA1C 6.5 (H) 04/20/2021  ? ? ?Review of Glycemic Control ? Latest Reference Range & Units 08/30/21 04:13 08/30/21 08:30  ?Glucose-Capillary 70 - 99 mg/dL 160 (H) 183 (H)  ?(H): Data is abnormally high ?Diabetes history: Type 2 DM ?Outpatient Diabetes medications: Metformin 500 mg BID (NT) ?Current orders for Inpatient glycemic control: Novolog 0-15 units Q4H ? ?Inpatient Diabetes Program Recommendations:   ? ?Consider adding Semglee 8 units QD and an A1C as last result was from 04/2021.  ? ?Thanks, ?Bronson Curb, MSN, RNC-OB ?Diabetes Coordinator ?[phone] (8a-5p) ? ? ? ? ?

## 2021-08-30 NOTE — Anesthesia Preprocedure Evaluation (Addendum)
Anesthesia Evaluation  ?Patient identified by MRN, date of birth, ID band ?Patient awake ? ? ? ?Reviewed: ?Allergy & Precautions, NPO status , Patient's Chart, lab work & pertinent test results ? ?History of Anesthesia Complications ?Negative for: history of anesthetic complications ? ?Airway ?Mallampati: III ? ?TM Distance: >3 FB ?Neck ROM: full ? ? ? Dental ? ?(+) Chipped, Dental Advidsory Given, Caps, Poor Dentition ?  ?Pulmonary ?neg pulmonary ROS,  ?  ?Pulmonary exam normal ? ? ? ? ? ? ? Cardiovascular ?Exercise Tolerance: Good ?hypertension, Pt. on medications ?(-) angina(-) Past MI and (-) Cardiac Stents Normal cardiovascular exam(-) dysrhythmias (-) Valvular Problems/Murmurs ? ? ?  ?Neuro/Psych ?PSYCHIATRIC DISORDERS Anxiety negative neurological ROS ?   ? GI/Hepatic ?GERD  ,Gastric outlet obstruction ?  ?Endo/Other  ?diabetes, Type 2, Oral Hypoglycemic AgentsHypothyroidism  ? Renal/GU ?negative Renal ROS  ?negative genitourinary ?  ?Musculoskeletal ? ? Abdominal ?  ?Peds ? Hematology ?negative hematology ROS ?(+)   ?Anesthesia Other Findings ?Obstructive jaundice ? ?Past Medical History: ?No date: Anemia ?No date: Anxiety ?No date: Gallstones ?No date: GERD (gastroesophageal reflux disease) ?No date: Hyperlipidemia ?No date: Hypertension ? ?Past Surgical History: ?04/03/1997: BILATERAL SALPINGECTOMY ?07/07/2015: CHOLECYSTECTOMY; N/A ?    Comment:  Procedure: LAPAROSCOPIC CHOLECYSTECTOMY ;  Surgeon:  ?             Jules Husbands, MD;  Location: [facility] ORS;  Service:  ?             General;  Laterality: N/A; ?07/07/2015: GALLBLADDER SURGERY ?    Comment:  [facility] ? ?BMI   ? Body Mass Index: 24.59 kg/m?  ?  ? ? Reproductive/Obstetrics ?negative OB ROS ? ?  ? ? ? ? ? ? ? ? ? ? ? ? ? ?  ?  ? ? ? ? ? ? ? ?Anesthesia Physical ?Anesthesia Plan ? ?ASA: 2 ? ?Anesthesia Plan: General  ? ?Post-op Pain Management:   ? ?Induction: Intravenous, Rapid sequence and Cricoid pressure planned ? ?PONV  Risk Score and Plan: Ondansetron, Dexamethasone and Treatment may vary due to age or medical condition ? ?Airway Management Planned: Oral ETT ? ?Additional Equipment:  ? ?Intra-op Plan:  ? ?Post-operative Plan: Extubation in OR ? ?Informed Consent:  ? ?Plan Discussed with: Anesthesiologist, CRNA and Surgeon ? ?Anesthesia Plan Comments: (Pt will have EGD to evaluate for obstruction prior to ERCP)  ? ? ? ? ? ?Anesthesia Quick Evaluation ? ?

## 2021-08-30 NOTE — Progress Notes (Signed)
?Progress Note ? ? ?Patient: Diamond Hinton DPO:[id number] DOB: 09/07/1955 DOA: 08/29/2021     1 ?DOS: the patient was seen and examined on 08/30/2021 ?  ?Brief hospital course: ?This 66 years old female with PMH significant for diabetes, hypertension, hypothyroidism, prior cholecystectomy sent by her PCP with abnormal labs notably elevated liver enzymes.  Patient reports having gastric reflux symptoms for about a month and has been receiving treatment with famotidine, sucralfate but her symptoms had not improved.  Work-up in the ED shows elevated AST ALT and total bilirubin, lipase 395, UA: Large LE.  CT abdomen and pelvis shows Marked distention of his stomach finding suggest possible benign or malignant stricture.  Endoscopic evaluation recommended.  Left breast mass with slightly spiculated margins consider outpatient mammogram.  GI is consulted patient is a scheduled to have ERCP today. ? ?Assessment and Plan: ?* Obstructive jaundice ?Patient presented with jaundice, with history of prior cholecystectomy ?Found to have elevated LFTs, lipase. ?GI was consulted and recommended ERCP. ?Patient is kept n.p.o. for possible ERCP. ?Continue adequate pain control. ?Continue IV Zofran for nausea and vomiting. ?Continue IV hydration. ? ?Elevated lipase ?No focal abnormality seen in the pancreas on CT ?Suspect related to obstructive process. ?Continue to trend if deemed necessary ? ?Gastric outlet obstruction ?CT  A/P: Shows gastric distention with possible pyloric stenosis. ?Continue NPO. NG tube if needed. ?Aspiration precautions ?Management as above ?Continue IV Protonix IV ? ?Uncontrolled type 2 diabetes mellitus with hyperglycemia, without long-term current use of insulin ([place]) ?Sliding scale insulin coverage ? ?History of cholecystectomy ?Noted history in the setting of current suspected obstructive jaundice ? ?Left breast mass ?She is found to have spiculated mass in left breast with recommendation for follow-up with  mammogram. ? ?UTI (urinary tract infection) ?Patient has received Rocephin in the ED. ?She denies any urinary symptoms. ?Hold off on antibiotics for now,  pending culture and resume if positive culture ? ?Abnormal LFTs ?Hepatic steatosis on CT with dilatation of intra and extra biliary ducts ?Concern for malignant process. ?Continue to trend ?For ERCP findings. ? ?Hypokalemia ?IV repletion while n.p.o. and monitor ? ?Hypertension ?Continue IV hydralazine as needed while n.p.o. ? ?Adult hypothyroidism ?Resume levothyroxine after ERCP. ? ? ?Subjective: Patient was seen and examined at bedside.  Overnight events noted. ?Patient reports having pain, bloating, feels nauseous, denies any vomiting. ?Patient is NPO and she is going to have ERCP today. ? ?Physical Exam: ?Vitals:  ? 08/29/21 1853 08/29/21 2000 08/29/21 2113 08/30/21 0418  ?BP: (!) 150/77 (!) 144/98 (!) 145/92 134/74  ?Pulse: 80 85 86 70  ?Resp: '17 17 16 18  '$ ?Temp: 98.1 ?F (36.7 ?C)  98.9 ?F (37.2 ?C) 99.1 ?F (37.3 ?C)  ?TempSrc: Oral     ?SpO2: 98% 97% 97% 97%  ?Weight:      ?Height:      ? ?General exam: Appears comfortable, not in any acute distress.  Deconditioned ?Respiratory : CTA bilaterally, no wheezing, no crackles, normal respiratory effort. ?Cardiovascular : S1 S2 heard, regular rate and rhythm, no murmur. ?Gastrointestinal system: Abdomen is soft, mildly tender, non distended, BS+ ?Central nervous system: Alert, oriented x3, no focal neurological deficits. ?Extremities: No edema, no cyanosis, no clubbing. ?Psychiatry: Mood, insight, judgment normal. ? ?Data Reviewed: ?I have Reviewed nursing notes, Vitals, and Lab results since pt's last encounter. Pertinent lab results CBC, CMP, lipase ?I have ordered test including CBC, CMP, lipase ?I have independently visualized and interpreted imaging CT abdomen pelvis which showed finding consistent with  gastric outlet obstruction.Marland Kitchen ?I have reviewed the last note from gastroenterology,  ?I have discussed pt's  care plan and test results with patient.  ? ?Family Communication: No family at bedside ? ?Disposition: ?Status is: Inpatient ?Remains inpatient appropriate because:  ? ?Admitted for obstructive jaundice, CBD dilatation.  Patient is going to have ERCP today. ? ? ? Planned Discharge Destination: Home ? ? ? ?Time spent: 50 minutes ? ?Author: ?Shawna Clamp, MD ?08/30/2021 12:21 PM ? ?For on call review [email or web address].  ?

## 2021-08-30 NOTE — Anesthesia Postprocedure Evaluation (Signed)
Anesthesia Post Note ? ?Patient: Diamond Hinton ? ?Procedure(s) Performed: ENDOSCOPIC RETROGRADE CHOLANGIOPANCREATOGRAPHY (ERCP) ?ESOPHAGOGASTRODUODENOSCOPY (EGD) ? ?Patient location during evaluation: PACU ?Anesthesia Type: General ?Level of consciousness: awake and alert ?Pain management: pain level controlled ?Vital Signs Assessment: post-procedure vital signs reviewed and stable ?Respiratory status: spontaneous breathing, nonlabored ventilation, respiratory function stable and patient connected to nasal cannula oxygen ?Cardiovascular status: blood pressure returned to baseline and stable ?Postop Assessment: no apparent nausea or vomiting ?Anesthetic complications: no ? ? ?No notable events documented. ? ? ?Last Vitals:  ?Vitals:  ? 08/30/21 1649 08/30/21 1954  ?BP: 131/79 (!) 141/76  ?Pulse: 86 78  ?Resp: 17 20  ?Temp:  36.8 ?C  ?SpO2: 96% 95%  ?  ?Last Pain:  ?Vitals:  ? 08/30/21 1954  ?TempSrc: Oral  ?PainSc:   ? ? ?  ?  ?  ?  ?  ?  ? ?Martha Clan ? ? ? ? ?

## 2021-08-30 NOTE — Anesthesia Postprocedure Evaluation (Deleted)
Anesthesia Post Note ? ?Patient: RETINA BERNARDY ? ?Procedure(s) Performed: ENDOSCOPIC RETROGRADE CHOLANGIOPANCREATOGRAPHY (ERCP) ?ESOPHAGOGASTRODUODENOSCOPY (EGD) ? ?Patient location during evaluation: Endoscopy ?Anesthesia Type: General ?Level of consciousness: awake and alert ?Pain management: pain level controlled ?Vital Signs Assessment: post-procedure vital signs reviewed and stable ?Respiratory status: spontaneous breathing, nonlabored ventilation, respiratory function stable and patient connected to nasal cannula oxygen ?Cardiovascular status: blood pressure returned to baseline and stable ?Postop Assessment: no apparent nausea or vomiting ?Anesthetic complications: no ? ? ?No notable events documented. ? ? ?Last Vitals:  ?Vitals:  ? 08/30/21 1649 08/30/21 1954  ?BP: 131/79 (!) 141/76  ?Pulse: 86 78  ?Resp: 17 20  ?Temp:  36.8 ?C  ?SpO2: 96% 95%  ?  ?Last Pain:  ?Vitals:  ? 08/30/21 1954  ?TempSrc: Oral  ?PainSc:   ? ? ?  ?  ?  ?  ?  ?  ? ?Martha Clan ? ? ? ? ?

## 2021-08-30 NOTE — Op Note (Signed)
[facility] ?Gastroenterology ?Patient Name: Diamond Hinton ?Procedure Date: 08/30/2021 3:27 PM ?MRN: [id number] ?Account #: [id number] ?Date of Birth: [birth year] ?Admit Type: Inpatient ?Age: 66 ?Room: [facility] ENDO ROOM 4 ?Gender: Female ?Note Status: Finalized ?Instrument Name: Upper Endoscope [id number] ?Procedure:             Upper GI endoscopy ?Indications:           Stenosis of the duodenum ?Providers:             Lucilla Lame MD, MD ?Referring MD:          Dionne Bucy. Bacigalupo (Referring MD) ?Medicines:             General Anesthesia ?Complications:         No immediate complications. ?Procedure:             Pre-Anesthesia Assessment: ?                       - Prior to the procedure, a History and Physical was  ?                       performed, and patient medications and allergies were  ?                       reviewed. The patient's tolerance of previous  ?                       anesthesia was also reviewed. The risks and benefits  ?                       of the procedure and the sedation options and risks  ?                       were discussed with the patient. All questions were  ?                       answered, and informed consent was obtained. Prior  ?                       Anticoagulants: The patient has taken no previous  ?                       anticoagulant or antiplatelet agents. ASA Grade  ?                       Assessment: II - A patient with mild systemic disease.  ?                       After reviewing the risks and benefits, the patient  ?                       was deemed in satisfactory condition to undergo the  ?                       procedure. ?                       After obtaining informed consent, the endoscope was  ?  passed under direct vision. Throughout the procedure,  ?                       the patient's blood pressure, pulse, and oxygen  ?                       saturations were monitored continuously. The Endoscope  ?                       was  introduced through the mouth, and advanced to the  ?                       duodenal bulb. The upper GI endoscopy was accomplished  ?                       without difficulty. The patient tolerated the  ?                       procedure well. ?Findings: ?     The examined esophagus was normal. ?     A large amount of food (residue) was found in the entire examined  ?     stomach. ?     An acquired severe stenosis was found in the first portion of the  ?     duodenum and was non-traversed. Biopsies were taken with a cold forceps  ?     for histology. ?Impression:            - Normal esophagus. ?                       - A large amount of food (residue) in the stomach. ?                       - Acquired duodenal stenosis. Biopsied. ?Recommendation:        - Return patient to hospital ward for ongoing care. ?                       - NPO. ?                       - Continue present medications. ?Procedure Code(s):     --- Professional --- ?                       [phone], Esophagogastroduodenoscopy, flexible,  ?                       transoral; with biopsy, single or multiple ?Diagnosis Code(s):     --- Professional --- ?                       K31.5, Obstruction of duodenum ?CPT copyright 2019 [employer]. All rights reserved. ?The codes documented in this report are preliminary and upon coder review may  ?be revised to meet current compliance requirements. ?Lucilla Lame MD, MD ?08/30/2021 4:07:59 PM ?This report has been signed electronically. ?Number of Addenda: 0 ?Note Initiated On: 08/30/2021 3:27 PM ?Estimated Blood Loss:  Estimated blood loss: none. ?     [facility] ?

## 2021-08-30 NOTE — TOC Initial Note (Signed)
Transition of Care (TOC) - Initial/Assessment Note  ? ? ?Patient Details  ?Name: Diamond Hinton ?MRN: [id number] ?Date of Birth: 1955/09/26 ? ?Transition of Care (TOC) CM/SW Contact:    ?Beverly Sessions, RN ?Phone Number: ?08/30/2021, 3:19 PM ? ?Clinical Narrative:                 ? ? ?  ?Transition of Care (TOC) Screening Note ? ? ?Patient Details  ?Name: Diamond Hinton ?Date of Birth: 06/27/1955 ? ? ?Transition of Care (TOC) CM/SW Contact:    ?Beverly Sessions, RN ?Phone Number: ?08/30/2021, 3:19 PM ? ? ? ?Transition of Care Department [facility]) has reviewed patient and no TOC needs have been identified at this time. We will continue to monitor patient advancement through interdisciplinary progression rounds. If new patient transition needs arise, please place a TOC consult. ? ? ?  ? ? ?Patient Goals and CMS Choice ?  ?  ?  ? ?Expected Discharge Plan and Services ?  ?  ?  ?  ?  ?                ?  ?  ?  ?  ?  ?  ?  ?  ?  ?  ? ?Prior Living Arrangements/Services ?  ?  ?  ?       ?  ?  ?  ?  ? ?Activities of Daily Living ?Home Assistive Devices/Equipment: None ?ADL Screening (condition at time of admission) ?Patient's cognitive ability adequate to safely complete daily activities?: No ?Is the patient deaf or have difficulty hearing?: No ?Does the patient have difficulty seeing, even when wearing glasses/contacts?: No ?Does the patient have difficulty concentrating, remembering, or making decisions?: No ?Patient able to express need for assistance with ADLs?: No ?Does the patient have difficulty dressing or bathing?: No ?Independently performs ADLs?: Yes (appropriate for developmental age) ?Communication: Independent ?Dressing (OT): Independent ?Grooming: Independent ?Feeding: Independent ?Bathing: Independent ?Toileting: Independent ?In/Out Bed: Independent ?Walks in Home: Independent ?Does the patient have difficulty walking or climbing stairs?: No ?Weakness of Legs: None ?Weakness of Arms/Hands: None ? ?Permission  Sought/Granted ?  ?  ?   ?   ?   ?   ? ?Emotional Assessment ?  ?  ?  ?  ?  ?  ? ?Admission diagnosis:  Obstructive jaundice [K83.1] ?Patient Active Problem List  ? Diagnosis Date Noted  ? Jaundice 08/29/2021  ? Right upper quadrant abdominal pain 08/29/2021  ? Rash 08/29/2021  ? Obstructive jaundice 08/29/2021  ? UTI (urinary tract infection) 08/29/2021  ? Left breast mass 08/29/2021  ? History of cholecystectomy 08/29/2021  ? Gastric outlet obstruction 08/29/2021  ? Hypokalemia 08/29/2021  ? Uncontrolled type 2 diabetes mellitus with hyperglycemia, without long-term current use of insulin ([place]) 08/29/2021  ? Elevated lipase 08/29/2021  ? Reflux gastritis 08/04/2021  ? Diabetes mellitus without complication ([place]) 62/56/3893  ? Hypertension 04/18/2021  ? Overweight 01/15/2019  ? Hyperlipidemia associated with type 2 diabetes mellitus ([place]) 02/19/2015  ? Abnormal LFTs 02/19/2015  ? Adult hypothyroidism 02/19/2015  ? ?PCP:  Virginia Crews, MD ?Pharmacy:   ?[place], [place] ?[place] [address] ?Phone: [phone] Fax: [phone] ? ?[place], [place] [address] ?[address] ?[place] [address] ?Phone: [phone] Fax: [phone] ? ?[place], [place] ?[address] ?[name]  [address] ?Phone: [phone] Fax: [phone] ? ? ? ? ?Social Determinants of Health (SDOH) Interventions ?  ? ?Readmission Risk Interventions ?No flowsheet data found. ? ? ?

## 2021-08-30 NOTE — Transfer of Care (Addendum)
Immediate Anesthesia Transfer of Care Note ? ?Patient: Diamond Hinton ? ?Procedure(s) Performed: ENDOSCOPIC RETROGRADE CHOLANGIOPANCREATOGRAPHY (ERCP) ?ESOPHAGOGASTRODUODENOSCOPY (EGD) ? ?Patient Location: PACU ? ?Anesthesia Type:General ? ?Level of Consciousness: drowsy ? ?Airway & Oxygen Therapy: Patient Spontanous Breathing ? ?Post-op Assessment: Report given to RN ? ?Post vital signs: stable ? ?Last Vitals:  ?Vitals Value Taken Time  ?BP    ?Temp    ?Pulse    ?Resp    ?SpO2    ? ? ?Last Pain:  ?Vitals:  ? 08/30/21 1455  ?TempSrc: Temporal  ?PainSc:   ?   ? ?Patients Stated Pain Goal: 0 (08/30/21 0900) ? ?Complications: No notable events documented. ?

## 2021-08-30 NOTE — Consult Note (Signed)
? ? ?GI Inpatient Consult Note ? ?Reason for Consult: Obstructive jaundice, elevated LFTs, abnormal CT abd ?  ?Attending Requesting Consult: Dr. Judd Gaudier ? ?History of Present Illness: ?Diamond Hinton is a 66 y.o. female seen for evaluation of obstructive jaundice at the request of admitting hospitalist - Dr. Judd Gaudier. Patient has a PMH of HTN, anxiety, HLD, GERD, hypothyroidism, and hx of cholecystectomy. She presented to the [facility] ED yesterday afternoon at the request of her primary care team for jaundice and abnormal liver enzymes. She was seen yesterday in the outpatient setting for new onset maculopapular rash, jaundice, and RUQ abdominal pain and was advised to go to the ED for immediate attention. Upon presentation to the ED, vital signs stable other than mild hypertension 152/91. Labs were significant for elevated LFTs with AST 96, ALT 162, total bilirubin 9.6, lipase 395, and negative lactic acid x2. UA suggestive of possible UTI. CT abd/pelvis performed and showed marked distention of stomach suggesting possible benign or malignant stricture in pylorus or proximal duodenum along with marked dilatation of intrahepatic and extrahepatic bile ducts which could be related to previous cholecystectomy or suggest stricture in distal CBD. There was also note of 2.1 cm nodular density in inferior aspect of left breast with spiculated margins. She was treated with dose of IV Rocephin, fluid bolus, and oral potassium. GI was consulted for further evaluation and management.  ? ?Patient seen and examined this morning resting comfortable in hospital bed. She denies any acute overnight events. She denies fevers, chills, altered mental status, or abdominal pain. She reports her stomach is making a lot of gurgling noises. She reports she has been dealing with a lot of dyspepsia symptoms over the past 1-2 months with a lot of belching and reflux. She has been treated by her primary care team with famotidine and  sucralfate for possible gastritis, but these treatments have not been effective. She has been dealing with postprandial distress and early satiety where she feels completely full just after a couple bites of food. She has noticed dark colored urine and yellowing of the sclera in her eyes. She denies any unintentional weight loss. She feels like symptoms started after she was started on Metformin for diabetes. She denies any known family history of any GI malignancies. She has never had an EGD or colonoscopy.  ? ?Past Medical History:  ?Past Medical History:  ?Diagnosis Date  ? Anemia   ? Anxiety   ? Gallstones   ? GERD (gastroesophageal reflux disease)   ? Hyperlipidemia   ? Hypertension   ?  ?Problem List: ?Patient Active Problem List  ? Diagnosis Date Noted  ? Jaundice 08/29/2021  ? Right upper quadrant abdominal pain 08/29/2021  ? Rash 08/29/2021  ? Obstructive jaundice 08/29/2021  ? UTI (urinary tract infection) 08/29/2021  ? Left breast mass 08/29/2021  ? History of cholecystectomy 08/29/2021  ? Gastric outlet obstruction 08/29/2021  ? Hypokalemia 08/29/2021  ? Uncontrolled type 2 diabetes mellitus with hyperglycemia, without long-term current use of insulin ([place]) 08/29/2021  ? Elevated lipase 08/29/2021  ? Reflux gastritis 08/04/2021  ? Diabetes mellitus without complication ([place]) 20/25/4270  ? Hypertension 04/18/2021  ? Overweight 01/15/2019  ? Hyperlipidemia associated with type 2 diabetes mellitus ([place]) 02/19/2015  ? Abnormal LFTs 02/19/2015  ? Adult hypothyroidism 02/19/2015  ?  ?Past Surgical History: ?Past Surgical History:  ?Procedure Laterality Date  ? BILATERAL SALPINGECTOMY  04/03/1997  ? CHOLECYSTECTOMY N/A 07/07/2015  ? Procedure: LAPAROSCOPIC CHOLECYSTECTOMY ;  Surgeon:  Diego Sarita Haver, MD;  Location: [facility] ORS;  Service: General;  Laterality: N/A;  ? GALLBLADDER SURGERY  07/07/2015  ? [place]  ?  ?Allergies: ?Allergies  ?Allergen Reactions  ? Peanuts [Peanut Oil] Shortness Of Breath and Itching  ?   ?Home Medications: ?Medications Prior to Admission  ?Medication Sig Dispense Refill Last Dose  ? cholecalciferol (VITAMIN D) 1000 units tablet Take 1,000 Units by mouth daily. (Patient not taking: Reported on 08/04/2021)     ? famotidine (PEPCID) 10 MG tablet Take 1 tablet (10 mg total) by mouth 2 (two) times daily. 60 tablet 3   ? hydrochlorothiazide (HYDRODIURIL) 25 MG tablet Take 1 tablet (25 mg total) by mouth daily. 90 tablet 3   ? levothyroxine (SYNTHROID) 75 MCG tablet Take 1 tablet (75 mcg total) by mouth daily. 90 tablet 3   ? metFORMIN (GLUCOPHAGE) 500 MG tablet TAKE 1 TABLET BY MOUTH TWICE DAILY WITH A MEAL (Patient not taking: Reported on 08/04/2021) 60 tablet 1   ? sucralfate (CARAFATE) 1 g tablet Take 1 tablet (1 g total) by mouth 4 (four) times daily -  with meals and at bedtime. (Patient not taking: Reported on 08/29/2021) 90 tablet 2   ? ?Home medication reconciliation was completed with the patient.  ? ?Scheduled Inpatient Medications: ?  ? insulin aspart  0-15 Units Subcutaneous Q4H  ? ? ?Continuous Inpatient Infusions: ?  ? sodium chloride 100 mL/hr at 08/30/21 0836  ? ? ?PRN Inpatient Medications:  ?acetaminophen **OR** acetaminophen, metoprolol tartrate, morphine injection, ondansetron **OR** ondansetron (ZOFRAN) IV ? ?Family History: ?family history includes CVA in her mother; Diabetes in her sister and sister; Heart disease in her father; Hypertension in her mother, sister, and sister; Ulcers in her mother.  The patient's family history is negative for inflammatory bowel disorders, GI malignancy, or solid organ transplantation. ? ?Social History:  ? reports that she has never smoked. She has never used smokeless tobacco. She reports that she does not drink alcohol and does not use drugs. The patient denies ETOH, tobacco, or drug use.  ? ?Review of Systems: ?Constitutional: Weight is stable.  ?Eyes: No changes in vision. ?ENT: No oral lesions, sore throat.  ?GI: see HPI.  ?Heme/Lymph: No easy  bruising.  ?CV: No chest pain.  ?GU: No hematuria.  ?Integumentary: No rashes.  ?Neuro: No headaches.  ?Psych: No depression/anxiety.  ?Endocrine: No heat/cold intolerance.  ?Allergic/Immunologic: No urticaria.  ?Resp: No cough, SOB.  ?Musculoskeletal: No joint swelling.  ?  ?Physical Examination: ?BP 134/74 (BP Location: Right Arm)   Pulse 70   Temp 99.1 ?F (37.3 ?C)   Resp 18   Ht 5' 3.5" (1.613 m)   Wt 64 kg   SpO2 97%   BMI 24.59 kg/m?  ?Gen: NAD, alert and oriented x 4 ?HEENT: PEERLA, EOMI, ?Neck: supple, no JVD or thyromegaly ?Chest: CTA bilaterally, no wheezes, crackles, or other adventitious sounds ?CV: RRR, no m/g/c/r ?Abd: soft, nondistended, +BS in all four quadrants; nontender to palpation in all four quadrants, gaseous noises audible upon palpation in upper abdomen, no HSM, guarding, ridigity, or rebound tenderness ?Ext: no edema, well perfused with 2+ pulses, ?Skin: no rash or lesions noted ?Lymph: no LAD ? ?Data: ?Lab Results  ?Component Value Date  ? WBC 8.2 08/30/2021  ? HGB 12.9 08/30/2021  ? HCT 38.8 08/30/2021  ? MCV 88.6 08/30/2021  ? PLT 220 08/30/2021  ? ?Recent Labs  ?Lab 08/29/21 ?1713 08/30/21 ?0431  ?HGB 15.0 12.9  ? ?Lab  Results  ?Component Value Date  ? NA 137 08/30/2021  ? K 3.4 (L) 08/30/2021  ? CL 103 08/30/2021  ? CO2 28 08/30/2021  ? BUN 12 08/30/2021  ? CREATININE 0.65 08/30/2021  ? GLU 111 09/24/2014  ? ?Lab Results  ?Component Value Date  ? ALT 127 (H) 08/30/2021  ? AST 80 (H) 08/30/2021  ? ALKPHOS 149 (H) 08/30/2021  ? BILITOT 7.4 (H) 08/30/2021  ? ?No results for input(s): APTT, INR, PTT in the last 168 hours. ? ?CT abd/pelvis with contrast 08/29/2021: ?IMPRESSION: ?There is 2.1 cm nodular density in the inferior aspect of left ?breast with slightly spiculated margins. Possibility of malignant ?neoplastic process in the left breast is not excluded. Please ?correlate with clinical physical examination findings and consider ?mammogram. ?  ?There is marked distention of  stomach. Findings suggest possible ?benign or malignant stricture in the pylorus or proximal duodenum. ?Endoscopic correlation should be considered. ?  ?There is no evidence of any significant small bowel dilation or ?colon

## 2021-08-31 ENCOUNTER — Encounter: Payer: Self-pay | Admitting: Gastroenterology

## 2021-08-31 ENCOUNTER — Inpatient Hospital Stay: Payer: Medicare HMO | Admitting: Radiology

## 2021-08-31 ENCOUNTER — Inpatient Hospital Stay: Payer: Medicare HMO

## 2021-08-31 DIAGNOSIS — K831 Obstruction of bile duct: Secondary | ICD-10-CM | POA: Diagnosis not present

## 2021-08-31 HISTORY — PX: IR BILIARY DRAIN PLACEMENT WITH CHOLANGIOGRAM: IMG6043

## 2021-08-31 LAB — GLUCOSE, CAPILLARY
Glucose-Capillary: 118 mg/dL — ABNORMAL HIGH (ref 70–99)
Glucose-Capillary: 130 mg/dL — ABNORMAL HIGH (ref 70–99)
Glucose-Capillary: 130 mg/dL — ABNORMAL HIGH (ref 70–99)
Glucose-Capillary: 135 mg/dL — ABNORMAL HIGH (ref 70–99)
Glucose-Capillary: 150 mg/dL — ABNORMAL HIGH (ref 70–99)
Glucose-Capillary: 155 mg/dL — ABNORMAL HIGH (ref 70–99)
Glucose-Capillary: 168 mg/dL — ABNORMAL HIGH (ref 70–99)
Glucose-Capillary: 171 mg/dL — ABNORMAL HIGH (ref 70–99)

## 2021-08-31 LAB — CBC
HCT: 39.6 % (ref 36.0–46.0)
Hemoglobin: 13.4 g/dL (ref 12.0–15.0)
MCH: 29.3 pg (ref 26.0–34.0)
MCHC: 33.8 g/dL (ref 30.0–36.0)
MCV: 86.5 fL (ref 80.0–100.0)
Platelets: 241 10*3/uL (ref 150–400)
RBC: 4.58 MIL/uL (ref 3.87–5.11)
RDW: 16.5 % — ABNORMAL HIGH (ref 11.5–15.5)
WBC: 9.3 10*3/uL (ref 4.0–10.5)
nRBC: 0 % (ref 0.0–0.2)

## 2021-08-31 LAB — COMPREHENSIVE METABOLIC PANEL
ALT: 123 U/L — ABNORMAL HIGH (ref 0–44)
AST: 78 U/L — ABNORMAL HIGH (ref 15–41)
Albumin: 3.1 g/dL — ABNORMAL LOW (ref 3.5–5.0)
Alkaline Phosphatase: 158 U/L — ABNORMAL HIGH (ref 38–126)
Anion gap: 6 (ref 5–15)
BUN: 18 mg/dL (ref 8–23)
CO2: 30 mmol/L (ref 22–32)
Calcium: 9 mg/dL (ref 8.9–10.3)
Chloride: 103 mmol/L (ref 98–111)
Creatinine, Ser: 0.7 mg/dL (ref 0.44–1.00)
GFR, Estimated: >60 mL/min (ref >60)
Glucose, Bld: 145 mg/dL — ABNORMAL HIGH (ref 70–99)
Potassium: 3.6 mmol/L (ref 3.5–5.1)
Sodium: 139 mmol/L (ref 135–145)
Total Bilirubin: 8.2 mg/dL — ABNORMAL HIGH (ref 0.3–1.2)
Total Protein: 6.6 g/dL (ref 6.5–8.1)

## 2021-08-31 LAB — PROTIME-INR
INR: 1.1 (ref 0.8–1.2)
Prothrombin Time: 14.2 seconds (ref 11.4–15.2)

## 2021-08-31 LAB — PHOSPHORUS: Phosphorus: 3.7 mg/dL (ref 2.5–4.6)

## 2021-08-31 LAB — MAGNESIUM: Magnesium: 2.3 mg/dL (ref 1.7–2.4)

## 2021-08-31 MED ORDER — FENTANYL CITRATE (PF) 100 MCG/2ML IJ SOLN
INTRAMUSCULAR | Status: AC | PRN
  Administered 2021-08-31: 50 ug via INTRAVENOUS
  Administered 2021-08-31 (×2): 25 ug via INTRAVENOUS

## 2021-08-31 MED ORDER — PHENOL 1.4 % MT LIQD
1.0000 | OROMUCOSAL | Status: DC | PRN
  Filled 2021-08-31: qty 177

## 2021-08-31 MED ORDER — IOHEXOL 350 MG/ML SOLN
10.0000 mL | Freq: Once | INTRAVENOUS | Status: AC | PRN
  Administered 2021-08-31: 10 mL

## 2021-08-31 MED ORDER — FENTANYL CITRATE (PF) 100 MCG/2ML IJ SOLN
INTRAMUSCULAR | Status: AC
Start: 2021-08-31 — End: 2021-09-01
  Filled 2021-08-31: qty 2

## 2021-08-31 MED ORDER — LIDOCAINE HCL 1 % IJ SOLN
INTRAMUSCULAR | Status: AC
  Administered 2021-08-31: 5 mL
  Filled 2021-08-31: qty 20

## 2021-08-31 MED ORDER — CEFTRIAXONE SODIUM 2 G IV SOLR
INTRAVENOUS | Status: AC | PRN
  Administered 2021-08-31: 2 g via INTRAVENOUS

## 2021-08-31 MED ORDER — MENTHOL 3 MG MT LOZG
1.0000 | LOZENGE | OROMUCOSAL | Status: DC | PRN
  Filled 2021-08-31: qty 9

## 2021-08-31 MED ORDER — MIDAZOLAM HCL 2 MG/2ML IJ SOLN
INTRAMUSCULAR | Status: AC | PRN
  Administered 2021-08-31 (×2): 1 mg via INTRAVENOUS

## 2021-08-31 MED ORDER — PIPERACILLIN-TAZOBACTAM 3.375 G IVPB
3.3750 g | Freq: Three times a day (TID) | INTRAVENOUS | Status: DC
  Administered 2021-08-31 – 2021-09-06 (×18): 3.375 g via INTRAVENOUS
  Filled 2021-08-31 (×18): qty 50

## 2021-08-31 MED ORDER — MIDAZOLAM HCL 2 MG/2ML IJ SOLN
INTRAMUSCULAR | Status: AC
Start: 2021-08-31 — End: 2021-09-01
  Filled 2021-08-31: qty 4

## 2021-08-31 MED ORDER — SODIUM CHLORIDE 0.9% FLUSH
5.0000 mL | Freq: Three times a day (TID) | INTRAVENOUS | Status: DC
  Administered 2021-08-31 – 2021-09-02 (×6): 5 mL

## 2021-08-31 MED ORDER — SODIUM CHLORIDE 0.9 % IV SOLN
2.0000 g | INTRAVENOUS | Status: AC
  Filled 2021-08-31 (×2): qty 20

## 2021-08-31 MED FILL — Ceftriaxone Sodium For Inj 2 GM: INTRAMUSCULAR | Qty: 20 | Status: AC

## 2021-08-31 MED FILL — Sodium Chloride IV Soln 0.9%: INTRAVENOUS | Qty: 100 | Status: AC

## 2021-08-31 NOTE — Consult Note (Addendum)
? ?Chief Complaint: ?Patient was seen in consultation today for biliary obstruction at the request of  ?Annamaria Helling, DO  ? ? ?Referring Physician(s): ?Annamaria Helling, DO  ? ?Supervising Physician: Mir, Sharen Heck ? ?Patient Status: [place] - In-pt ? ?History of Present Illness: ?Diamond Hinton is a 66 y.o. female with PMHx significant for DM, HTN, gallstones s/p prior cholecystectomy, GERD who presented to ED 3/20 after seeing her primary care provider for reflux, weakness, abdominal pain, dark urine and yellowing of the skin with lack of appetite and nausea and found to have elevated LFTs. The patient underwent imaging in ED that revealed distention of the stomach findings suggest possible benign or malignant stricture, marked dilation of intra and extra hepatic bile ducts. Patient underwent ERCP that was aborted secondary to inability to traverse past first part of duodenum, biopsy was taken at this region and pathology is pending. IR received request for biliary drainage catheter placement. The patient denies any current chest pain or shortness of breath. She denies any current blood thinner use, denies any known bleeding or clotting disorder. The patient denies any recent infections, fever or chills. The patient denies any history of sleep apnea or chronic oxygen use. She has no known complications to sedation.  ? ? ?Past Medical History:  ?Diagnosis Date  ? Anemia   ? Anxiety   ? Gallstones   ? GERD (gastroesophageal reflux disease)   ? Hyperlipidemia   ? Hypertension   ? ? ?Past Surgical History:  ?Procedure Laterality Date  ? BILATERAL SALPINGECTOMY  04/03/1997  ? CHOLECYSTECTOMY N/A 07/07/2015  ? Procedure: LAPAROSCOPIC CHOLECYSTECTOMY ;  Surgeon: Jules Husbands, MD;  Location: [facility] ORS;  Service: General;  Laterality: N/A;  ? ESOPHAGOGASTRODUODENOSCOPY  08/30/2021  ? Procedure: ESOPHAGOGASTRODUODENOSCOPY (EGD);  Surgeon: Lucilla Lame, MD;  [facility];  Service: Endoscopy;;  ?  GALLBLADDER SURGERY  07/07/2015  ? [facility]  ? ? ?Allergies: ?Peanuts [peanut oil] ? ?Medications: ?Prior to Admission medications   ?Medication Sig Start Date End Date Taking? Authorizing Provider  ?famotidine (PEPCID) 10 MG tablet Take 1 tablet (10 mg total) by mouth 2 (two) times daily. 08/04/21  Yes Mecum, Erin E, PA-C  ?hydrochlorothiazide (HYDRODIURIL) 25 MG tablet Take 1 tablet (25 mg total) by mouth daily. 10/12/20  Yes Bacigalupo, Dionne Bucy, MD  ?levothyroxine (SYNTHROID) 75 MCG tablet Take 1 tablet (75 mcg total) by mouth daily. 10/12/20  Yes Bacigalupo, Dionne Bucy, MD  ?  ? ?Family History  ?Problem Relation Age of Onset  ? Hypertension Mother   ? CVA Mother   ? Ulcers Mother   ? Heart disease Father   ? Hypertension Sister   ? Diabetes Sister   ? Hypertension Sister   ? Diabetes Sister   ? ? ?Social History  ? ?Socioeconomic History  ? Marital status: Married  ?  Spouse name: Not on file  ? Number of children: Not on file  ? Years of education: Not on file  ? Highest education level: Not on file  ?Occupational History  ? Not on file  ?Tobacco Use  ? Smoking status: Never  ? Smokeless tobacco: Never  ?Vaping Use  ? Vaping Use: Never used  ?Substance and Sexual Activity  ? Alcohol use: No  ? Drug use: No  ? Sexual activity: Yes  ?Other Topics Concern  ? Not on file  ?Social History Narrative  ? Not on file  ? ?Social Determinants of Health  ? ?Financial Resource Strain: Not on  file  ?Food Insecurity: Not on file  ?Transportation Needs: Not on file  ?Physical Activity: Not on file  ?Stress: Not on file  ?Social Connections: Not on file  ? ?Review of Systems: A 12 point ROS discussed and pertinent positives are indicated in the HPI above.  All other systems are negative. ? ?Review of Systems ? ?Vital Signs: ?BP (!) 143/75 (BP Location: Right Arm)   Pulse 68   Temp 98.1 ?F (36.7 ?C) (Oral)   Resp 17   Ht 5' 3.5" (1.613 m)   Wt 141 lb (64 kg)   SpO2 98%   BMI 24.59 kg/m?  ? ?Physical Exam ?Constitutional:   ?    Appearance: Normal appearance.  ?   Comments: NGT intact  ?HENT:  ?   Head: Normocephalic and atraumatic.  ?Cardiovascular:  ?   Rate and Rhythm: Normal rate and regular rhythm.  ?Pulmonary:  ?   Effort: Pulmonary effort is normal. No respiratory distress.  ?Abdominal:  ?   General: There is distension.  ?   Palpations: Abdomen is soft.  ?Skin: ?   General: Skin is warm and dry.  ?Neurological:  ?   Mental Status: She is alert and oriented to person, place, and time.  ? ? ?Imaging: ?CT Abdomen Pelvis W Contrast ? ?Result Date: 08/29/2021 ?CLINICAL DATA:  Abdominal pain EXAM: CT ABDOMEN AND PELVIS WITH CONTRAST TECHNIQUE: Multidetector CT imaging of the abdomen and pelvis was performed using the standard protocol following bolus administration of intravenous contrast. RADIATION DOSE REDUCTION: This exam was performed according to the departmental dose-optimization program which includes automated exposure control, adjustment of the mA and/or kV according to patient size and/or use of iterative reconstruction technique. CONTRAST:  167m OMNIPAQUE IOHEXOL 300 MG/ML  SOLN COMPARISON:  Abdominal sonogram done on 07/06/2015 FINDINGS: Lower chest: There are small linear densities in the lower lung fields suggesting subsegmental atelectasis. There is 2.1 cm nodular density with slightly spiculated margins in the inferior lateral aspect of left breast. Hepatobiliary: There is fatty infiltration in the liver. There is dilation of intrahepatic and extrahepatic bile ducts. Distal common bile duct in the head of the pancreas measures proximally 1.7 cm. There is no definite demonstrable intraluminal filling defect in the bile ducts. Surgical clips are seen in gallbladder fossa. Pancreas: There is moderate to marked dilation of pancreatic duct. No focal abnormality is seen. Spleen: Unremarkable. Adrenals/Urinary Tract: Adrenals are unremarkable. There is no hydronephrosis. There are no renal or ureteral stones. Urinary bladder is  unremarkable. Stomach/Bowel: There is marked distention of stomach. There is large amount of fluid in the lumen of the stomach. There are high density foci in the dependent portion of stomach lumen, possibly oral medication. Duodenum is not distended. Small bowel loops are unremarkable. Appendix is not dilated. There is no significant wall thickening in colon. There is no pericolic stranding. Scattered diverticula are seen without signs of focal acute diverticulitis. Vascular/Lymphatic: Arterial calcifications and atherosclerotic plaques are seen in the aorta and its major branches. Reproductive: Unremarkable. Other: There is no ascites or pneumoperitoneum. Small umbilical hernia containing fat is seen. Musculoskeletal: Degenerative changes are noted in the lumbar spine, particularly severe at L4-L5 and L5-S1 levels with encroachment of neural foramina, more severe on the left side at L4-L5 level. IMPRESSION: There is 2.1 cm nodular density in the inferior aspect of left breast with slightly spiculated margins. Possibility of malignant neoplastic process in the left breast is not excluded. Please correlate with clinical physical examination findings  and consider mammogram. There is marked distention of stomach. Findings suggest possible benign or malignant stricture in the pylorus or proximal duodenum. Endoscopic correlation should be considered. There is no evidence of any significant small bowel dilation or colonic distention. There is no hydronephrosis. Appendix is not dilated. Fatty liver. Status post cholecystectomy. There is marked dilation of intrahepatic and extrahepatic bile ducts. This may be related to previous cholecystectomy or suggest stricture in the distal common bile duct. Please correlate with laboratory findings and consider ERCP if warranted. There is no hydronephrosis. There are small linear densities in both lower lung fields suggesting subsegmental atelectasis and possibly scarring. Other  findings as described in the body of the report. Electronically Signed   By: Elmer Picker M.D.   On: 08/29/2021 19:01  ? ?MR ABDOMEN MRCP WO CONTRAST ? ?Result Date: 08/30/2021 ?CLINICAL DATA:  Elevated LFTs, prior

## 2021-08-31 NOTE — Procedures (Signed)
Interventional Radiology Procedure Note ? ?Procedure: External biliary drain placement ? ?Indication: Biliary obstruction ? 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  Unable to cross with drain.  10.2 fr external biliary drain placed with pigtail positioned in the CBD.  ? ?Patient should return in 2 days to once again to attempt crossing the Sphincter. ? ?Please refer to procedural dictation for full description. ? ?Complications: None ? ?EBL: < 10 mL ? ?Miachel Roux, MD ?[phone] ? ? ?

## 2021-08-31 NOTE — Progress Notes (Signed)
? ? ?GI Inpatient Progress Note ? ? ?HPI: ? ?Patient in follow-up for gastric outlet obstruction, elevated LFTs 2/2 biliary obstruction from duodenal/ampullary mass. No acute events overnight. ERCP unable to be performed yesterday due to gastric outlet obstruction 2/2 duodenal stricture. Biopsies of stricture still in process. She denies any abdominal pain. She denies any fevers, chills, or altered mental status. MRI/MRCP performed last night did show suspected duodenal/ampullary mass. NGT placed this morning for decompression. IR has been consulted for biliary drain placement and possible brushings. No new complaints.  ? ? ?Past Medical History:  ?Past Medical History:  ?Diagnosis Date  ? Anemia   ? Anxiety   ? Gallstones   ? GERD (gastroesophageal reflux disease)   ? Hyperlipidemia   ? Hypertension   ?  ?Problem List: ?Patient Active Problem List  ? Diagnosis Date Noted  ? Stenosis of duodenum   ? Jaundice 08/29/2021  ? Right upper quadrant abdominal pain 08/29/2021  ? Rash 08/29/2021  ? Obstructive jaundice 08/29/2021  ? UTI (urinary tract infection) 08/29/2021  ? Left breast mass 08/29/2021  ? History of cholecystectomy 08/29/2021  ? Gastric outlet obstruction 08/29/2021  ? Hypokalemia 08/29/2021  ? Uncontrolled type 2 diabetes mellitus with hyperglycemia, without long-term current use of insulin ([place]) 08/29/2021  ? Elevated lipase 08/29/2021  ? Reflux gastritis 08/04/2021  ? Diabetes mellitus without complication ([place]) 62/56/3893  ? Hypertension 04/18/2021  ? Overweight 01/15/2019  ? Hyperlipidemia associated with type 2 diabetes mellitus ([place]) 02/19/2015  ? Abnormal LFTs 02/19/2015  ? Adult hypothyroidism 02/19/2015  ?  ?Past Surgical History: ?Past Surgical History:  ?Procedure Laterality Date  ? BILATERAL SALPINGECTOMY  04/03/1997  ? CHOLECYSTECTOMY N/A 07/07/2015  ? Procedure: LAPAROSCOPIC CHOLECYSTECTOMY ;  Surgeon: Jules Husbands, MD;  Location: [facility] ORS;  Service: General;  Laterality: N/A;  ?  ESOPHAGOGASTRODUODENOSCOPY  08/30/2021  ? Procedure: ESOPHAGOGASTRODUODENOSCOPY (EGD);  Surgeon: Lucilla Lame, MD;  Location: [facility] ENDOSCOPY;  Service: Endoscopy;;  ? GALLBLADDER SURGERY  07/07/2015  ? [place]  ?  ?Allergies: ?Allergies  ?Allergen Reactions  ? Peanuts [Peanut Oil] Shortness Of Breath and Itching  ?  ?Home Medications: ?Medications Prior to Admission  ?Medication Sig Dispense Refill Last Dose  ? famotidine (PEPCID) 10 MG tablet Take 1 tablet (10 mg total) by mouth 2 (two) times daily. 60 tablet 3 08/29/2021  ? hydrochlorothiazide (HYDRODIURIL) 25 MG tablet Take 1 tablet (25 mg total) by mouth daily. 90 tablet 3 08/29/2021  ? levothyroxine (SYNTHROID) 75 MCG tablet Take 1 tablet (75 mcg total) by mouth daily. 90 tablet 3 08/29/2021  ? ?Home medication reconciliation was completed with the patient.  ? ?Scheduled Inpatient Medications: ?  ? fentaNYL      ? insulin aspart  0-15 Units Subcutaneous Q4H  ? insulin glargine-yfgn  8 Units Subcutaneous Daily  ? lidocaine      ? midazolam      ? ? ?Continuous Inpatient Infusions: ?  ? sodium chloride 100 mL/hr at 08/31/21 0920  ? cefTRIAXone (ROCEPHIN)  IV    ? ? ?PRN Inpatient Medications:  ?acetaminophen **OR** acetaminophen, menthol-cetylpyridinium, metoprolol tartrate, morphine injection, ondansetron **OR** ondansetron (ZOFRAN) IV ? ?Family History: ?family history includes CVA in her mother; Diabetes in her sister and sister; Heart disease in her father; Hypertension in her mother, sister, and sister; Ulcers in her mother.  The patient's family history is negative for inflammatory bowel disorders, GI malignancy, or solid organ transplantation. ? ?Social History:  ? reports that she has never smoked.  She has never used smokeless tobacco. She reports that she does not drink alcohol and does not use drugs. The patient denies ETOH, tobacco, or drug use.  ? ?Review of Systems: ?Constitutional: Weight is stable.  ?Eyes: No changes in vision. ?ENT: No oral lesions, sore  throat.  ?GI: see HPI.  ?Heme/Lymph: No easy bruising.  ?CV: No chest pain.  ?GU: No hematuria.  ?Integumentary: No rashes.  ?Neuro: No headaches.  ?Psych: No depression/anxiety.  ?Endocrine: No heat/cold intolerance.  ?Allergic/Immunologic: No urticaria.  ?Resp: No cough, SOB.  ?Musculoskeletal: No joint swelling.  ?  ?Physical Examination: ?BP (!) 167/88   Pulse 79   Temp 98.2 ?F (36.8 ?C) (Oral)   Resp 20   Ht 5' 3.5" (1.613 m)   Wt 64 kg   SpO2 96%   BMI 24.59 kg/m?  ?Gen: NAD, alert and oriented x 4, anxious-appearing  ?HEENT: PEERLA, EOMI, ?Neck: supple, no JVD or thyromegaly ?Chest: CTA bilaterally, no wheezes, crackles, or other adventitious sounds ?CV: RRR, no m/g/c/r ?Abd: soft, NT, ND, +BS in all four quadrants; no HSM, guarding, ridigity, or rebound tenderness ?Ext: no edema, well perfused with 2+ pulses, ?Skin: no rash or lesions noted ?Lymph: no LAD ? ?Data: ?Lab Results  ?Component Value Date  ? WBC 9.3 08/31/2021  ? HGB 13.4 08/31/2021  ? HCT 39.6 08/31/2021  ? MCV 86.5 08/31/2021  ? PLT 241 08/31/2021  ? ?Recent Labs  ?Lab 08/29/21 ?1713 08/30/21 ?0431 08/31/21 ?0426  ?HGB 15.0 12.9 13.4  ? ?Lab Results  ?Component Value Date  ? NA 139 08/31/2021  ? K 3.6 08/31/2021  ? CL 103 08/31/2021  ? CO2 30 08/31/2021  ? BUN 18 08/31/2021  ? CREATININE 0.70 08/31/2021  ? GLU 111 09/24/2014  ? ?Lab Results  ?Component Value Date  ? ALT 123 (H) 08/31/2021  ? AST 78 (H) 08/31/2021  ? ALKPHOS 158 (H) 08/31/2021  ? BILITOT 8.2 (H) 08/31/2021  ? ?Recent Labs  ?Lab 08/31/21 ?0174  ?INR 1.1  ? ?MRI/MRCP wo contrast 08/30/21: ?IMPRESSION: ?Suspected masslike duodenal wall thickening extending to the ?ampulla, worrisome for duodenal/ampullary carcinoma. Consider ?endoscopy/EUS for further evaluation. ?  ?Associated intrahepatic/extrahepatic ductal dilatation. Common duct ?measures 18 mm. No choledocholithiasis is seen. ?  ?Associated prominent pancreatic duct, extending to the ampulla, ?measuring 6 mm. ?   ?Associated duodenal narrowing/stricture with suspected functional ?gastric outlet obstruction. ?  ?2.3 cm irregular/spiculated lesion in the left inferior breast, ?suspicious for primary breast neoplasm. Mammographic correlation is ?suggested. ?  ? ?Assessment/Plan: ?66 y/o Caucasian female with a PMH of HTN, hypothyroidism, diabetes mellitus, Hx of cholecystectomy 06/2015, and anxiety presented to the [facility] ED yesterday evening for chief complaint of jaundice and abnormal liver enzymes. GI consulted for further evaluation and management.  ? ?Obstructive jaundice 2/2 biliary obstruction from duodenal/ampullary mass ? 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 ?Left breast mass - primary team following, recommend f/u mammogram  ? ?Hx of cholecystectomy ? ?Abnormal LFTs - 2/2 #1, tbili trending up ? ?Hypokalemia - resolved  ? ?Recommendations: ? ?- Reviewed findings from EGD and MRI/MRCP with patient in room today with concerns for possible underlying malignancy process ?- Follow-up results of AFP and CA 19-9  ?- Follow-up results of biopsies of duodenal stricture ?- Continue to monitor daily LFTs ?- Consult placed to IR for biliary drain placement +/- brushings if deemed feasible  ?- Continue supportive measures with pain control and anti-emetics  ?- Keep HOB elevated at 30 degrees  ?-  Continue serial abdominal examinations  ?- Continue NGT placement for decompression to low intermittent wall suction  ?- If malignancy is confirmed, will ultimately require transfer to tertiary center for hepatobiliary surgery ?- Plan of care was discussed and reviewed with Dr. Virgina Jock ? ?Thank you for the consult. Please call with questions or concerns. ? ?Geanie Kenning, PA-C ?[facility] ?[phone] ?[phone] (Cell) ? ?

## 2021-08-31 NOTE — Progress Notes (Signed)
?Progress Note ? ? ?Patient: Diamond Hinton WNU:[id number] DOB: [birth year] DOA: 08/29/2021     2 ?DOS: the patient was seen and examined on 08/31/2021 ?  ?Brief hospital course: ?This 66 years old female with PMH significant for diabetes, hypertension, hypothyroidism, prior cholecystectomy sent by her PCP with abnormal labs notably elevated liver enzymes.  Patient reports having gastric reflux symptoms for about a month and has been receiving treatment with famotidine, sucralfate but her symptoms had not improved.  Work-up in the ED shows elevated AST ALT and total bilirubin, lipase 395, UA: Large LE.  CT abdomen and pelvis shows marked distention of the stomach finding suggestive of possible benign or malignant stricture.  Endoscopic evaluation recommended.  Left breast mass with slightly spiculated margins consider outpatient mammogram.   ? ?GI was consulted.  They attempted ERCP on 3/21, but unable to proceed due to food and liquid residue in the stomach.  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   ? ?IR consulted for biliary drain placement on 3/22. ? ?Assessment and Plan: ?* Obstructive jaundice ?Presented with jaundice, with history of prior cholecystectomy, with elevated LFTs, lipase. ?GI was consulted and recommended ERCP. ?ERCP was attempted on 3/21 but unable to be performed due to food and liquid residue within the stomach.  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 ?--Further evaluation as outlined. ?--Continue pain control as needed. ?--Continue IV Zofran for nausea and vomiting. ?--Continue IV hydration. ? ?Elevated lipase ?No focal abnormality seen in the pancreas on CT ?Suspect related to obstructive process. ?-- Biliary drain to be placed by IR today ? ?Gastric outlet obstruction ?CT  A/P: Shows gastric distention with possible pyloric stenosis. ?Severe stricture in the first portion of the duodenum noted during attempted ERCP on 3/21,  biopsied. ?Continue NPO.  ?NG tube to be placed this morning. ?Aspiration precautions ?Management as above ?Continue IV Protonix IV ?Follow-up biopsy pathology ? ?Uncontrolled type 2 diabetes mellitus with hyperglycemia, without long-term current use of insulin ([place]) ?Sliding scale insulin coverage ? ?History of cholecystectomy ?Noted history in the setting of current suspected obstructive jaundice ? ?Left breast mass ?Noted to have spiculated mass in left breast with recommendation for follow-up with mammogram. ? ?UTI (urinary tract infection) ?Patient treated with Rocephin in the ED. ?She denies any urinary symptoms. ?No further antibiotics indicated at this time. ?Monitor for symptoms. ? ?Abnormal LFTs ?Hepatic steatosis on CT with dilatation of intra and extra biliary ducts.   ?Concern for malignant process, as outlined. ?--Follow CMP ? ?Stenosis of duodenum ?Biopsy pending.  Biliary drain placement today. ?Follow-up GI and IR recommendations ? ?Hypokalemia ?IV repletion while n.p.o. and monitor ? ?Hypertension ?Continue IV hydralazine as needed while n.p.o. ? ?Adult hypothyroidism ?Resume levothyroxine after procedures and taking p.o. ? ? ? ? ?  ? ?Subjective: Patient awake sitting up in bed when seen this morning.  She had been declining placement of NG tube by nursing because she had been told it would be done under sedation and with radiology.  Once explained the risk of complications from sedation giving her stomach being full, she was agreeable to have nursing place it if premedicated with pain med.  She reports having very dry mouth while NPO.  Asks about thyroid medication being restarted.  No other acute complaints at this time. ? ?Physical Exam: ?Vitals:  ? 08/31/21 0804 08/31/21 1051 08/31/21 1102 08/31/21 1302  ?BP: (!) 143/75   (!) 161/74  ?  Pulse: 68   69  ?Resp: 18 18 17 13   ?Temp: 98.1 ?F (36.7 ?C)   98.2 ?F (36.8 ?C)  ?TempSrc: Oral   Oral  ?SpO2: 98%   96%  ?Weight:      ?Height:      ? ?General  exam: awake, alert, no acute distress ?HEENT: Scleral icterus noted, dry mucus membranes, hearing grossly normal  ?Respiratory system: CTAB, no wheezes, rales or rhonchi, normal respiratory effort. ?Cardiovascular system: normal S1/S2, RRR, no JVD, murmurs, rubs, gallops, no pedal edema.   ?Gastrointestinal system: soft, NT, ND, no HSM felt, +bowel sounds. ?Central nervous system: A&O x3. no gross focal neurologic deficits, normal speech ?Extremities: moves all, no edema, normal tone ?Skin: Jaundice, dry, intact, normal temperature ?Psychiatry: normal mood, congruent affect, judgement and insight appear normal ? ? ?Data Reviewed: ? ?Labs reviewed and notable for glucose 145, alk phos 158, albumin 3.1, AST 78, ALT 123, total bili 8.2 ? ?Family Communication: None at bedside, will attempt to call ? ?Disposition: ?Status is: Inpatient ?Remains inpatient appropriate because: Ongoing evaluation not appropriate for the outpatient setting.  N.p.o. status for procedures ? ? ? ? Planned Discharge Destination: Home ? ? ? ?Time spent: 40 minutes ? ?Author: ?Ezekiel Slocumb, DO ?08/31/2021 1:42 PM ? ?For on call review [email or web address].  ?

## 2021-08-31 NOTE — Assessment & Plan Note (Addendum)
Confirmed metastatic breast cancer. ?Biliary drain placed by IR 3/22. ?Surgery did laparoscopic gastrojejunostomy which relieved obstruction ?\\ ?

## 2021-09-01 DIAGNOSIS — K831 Obstruction of bile duct: Secondary | ICD-10-CM | POA: Diagnosis not present

## 2021-09-01 LAB — CBC
HCT: 41.4 % (ref 36.0–46.0)
Hemoglobin: 13.8 g/dL (ref 12.0–15.0)
MCH: 29.7 pg (ref 26.0–34.0)
MCHC: 33.3 g/dL (ref 30.0–36.0)
MCV: 89.2 fL (ref 80.0–100.0)
Platelets: 260 10*3/uL (ref 150–400)
RBC: 4.64 MIL/uL (ref 3.87–5.11)
RDW: 17.5 % — ABNORMAL HIGH (ref 11.5–15.5)
WBC: 11.1 10*3/uL — ABNORMAL HIGH (ref 4.0–10.5)
nRBC: 0 % (ref 0.0–0.2)

## 2021-09-01 LAB — GLUCOSE, CAPILLARY
Glucose-Capillary: 145 mg/dL — ABNORMAL HIGH (ref 70–99)
Glucose-Capillary: 169 mg/dL — ABNORMAL HIGH (ref 70–99)
Glucose-Capillary: 129 mg/dL — ABNORMAL HIGH (ref 70–99)
Glucose-Capillary: 143 mg/dL — ABNORMAL HIGH (ref 70–99)
Glucose-Capillary: 132 mg/dL — ABNORMAL HIGH (ref 70–99)

## 2021-09-01 LAB — COMPREHENSIVE METABOLIC PANEL
ALT: 107 U/L — ABNORMAL HIGH (ref 0–44)
AST: 67 U/L — ABNORMAL HIGH (ref 15–41)
Albumin: 3 g/dL — ABNORMAL LOW (ref 3.5–5.0)
Alkaline Phosphatase: 146 U/L — ABNORMAL HIGH (ref 38–126)
Anion gap: 8 (ref 5–15)
BUN: 26 mg/dL — ABNORMAL HIGH (ref 8–23)
CO2: 23 mmol/L (ref 22–32)
Calcium: 8.8 mg/dL — ABNORMAL LOW (ref 8.9–10.3)
Chloride: 109 mmol/L (ref 98–111)
Creatinine, Ser: 0.74 mg/dL (ref 0.44–1.00)
GFR, Estimated: >60 mL/min (ref >60)
Glucose, Bld: 141 mg/dL — ABNORMAL HIGH (ref 70–99)
Potassium: 3.9 mmol/L (ref 3.5–5.1)
Sodium: 140 mmol/L (ref 135–145)
Total Bilirubin: 5 mg/dL — ABNORMAL HIGH (ref 0.3–1.2)
Total Protein: 6.1 g/dL — ABNORMAL LOW (ref 6.5–8.1)

## 2021-09-01 LAB — MAGNESIUM: Magnesium: 2.3 mg/dL (ref 1.7–2.4)

## 2021-09-01 LAB — CA 19-9 (SERIAL): CA 19-9: 347 U/mL — ABNORMAL HIGH (ref 0–35)

## 2021-09-01 LAB — AFP TUMOR MARKER: AFP, Serum, Tumor Marker: <1.8 ng/mL (ref 0.0–9.2)

## 2021-09-01 MED ORDER — LEVOTHYROXINE SODIUM 100 MCG/5ML IV SOLN
37.5000 ug | Freq: Every day | INTRAVENOUS | Status: DC
  Administered 2021-09-04 – 2021-09-05 (×2): 37.5 ug via INTRAVENOUS
  Filled 2021-09-01 (×3): qty 5

## 2021-09-01 MED ORDER — LEVOTHYROXINE SODIUM 100 MCG/5ML IV SOLN: 75.0000 ug | Freq: Every day | INTRAVENOUS | Status: DC

## 2021-09-01 NOTE — Progress Notes (Addendum)
? ? ?GI Inpatient Progress Note ? ? ?HPI: ? ?Patient seen in follow-up for gastric outlet obstruction, elevated LFTs 2/2 biliary obstruction from duodenal/ampullary mass. No acute events overnight. She has had a lot of output from her biliary drain and it has been emptied 4 times since last night. Liver labs have improved as expected with drainage. She denies any nausea, vomiting, abdominal pain, or abdominal distention. No Bms since last night. Labs today significant for mild leukocytosis (11.1K), AST 67, ALT 107, alk phos 146, total bilirubin 5.0. Awaiting biopsy results. No new gi complaints or concerns today.  ? ?Past Medical History:  ?Past Medical History:  ?Diagnosis Date  ? Anemia   ? Anxiety   ? Gallstones   ? GERD (gastroesophageal reflux disease)   ? Hyperlipidemia   ? Hypertension   ?  ?Problem List: ?Patient Active Problem List  ? Diagnosis Date Noted  ? Stenosis of duodenum   ? Jaundice 08/29/2021  ? Right upper quadrant abdominal pain 08/29/2021  ? Rash 08/29/2021  ? Obstructive jaundice 08/29/2021  ? UTI (urinary tract infection) 08/29/2021  ? Left breast mass 08/29/2021  ? History of cholecystectomy 08/29/2021  ? Gastric outlet obstruction 08/29/2021  ? Hypokalemia 08/29/2021  ? Uncontrolled type 2 diabetes mellitus with hyperglycemia, without long-term current use of insulin ([place]) 08/29/2021  ? Elevated lipase 08/29/2021  ? Reflux gastritis 08/04/2021  ? Diabetes mellitus without complication ([place]) 93/26/7124  ? Hypertension 04/18/2021  ? Overweight 01/15/2019  ? Hyperlipidemia associated with type 2 diabetes mellitus ([place]) 02/19/2015  ? Abnormal LFTs 02/19/2015  ? Adult hypothyroidism 02/19/2015  ?  ?Past Surgical History: ?Past Surgical History:  ?Procedure Laterality Date  ? BILATERAL SALPINGECTOMY  04/03/1997  ? CHOLECYSTECTOMY N/A 07/07/2015  ? Procedure: LAPAROSCOPIC CHOLECYSTECTOMY ;  Surgeon: Jules Husbands, MD;  Location: [facility] ORS;  Service: General;  Laterality: N/A;  ?  ESOPHAGOGASTRODUODENOSCOPY  08/30/2021  ? Procedure: ESOPHAGOGASTRODUODENOSCOPY (EGD);  Surgeon: Lucilla Lame, MD;  Location: [facility] ENDOSCOPY;  Service: Endoscopy;;  ? GALLBLADDER SURGERY  07/07/2015  ? [place]  ? IR BILIARY DRAIN PLACEMENT WITH CHOLANGIOGRAM  08/31/2021  ?  ?Allergies: ?Allergies  ?Allergen Reactions  ? Peanuts [Peanut Oil] Shortness Of Breath and Itching  ?  ?Home Medications: ?Medications Prior to Admission  ?Medication Sig Dispense Refill Last Dose  ? famotidine (PEPCID) 10 MG tablet Take 1 tablet (10 mg total) by mouth 2 (two) times daily. 60 tablet 3 08/29/2021  ? hydrochlorothiazide (HYDRODIURIL) 25 MG tablet Take 1 tablet (25 mg total) by mouth daily. 90 tablet 3 08/29/2021  ? levothyroxine (SYNTHROID) 75 MCG tablet Take 1 tablet (75 mcg total) by mouth daily. 90 tablet 3 08/29/2021  ? ?Home medication reconciliation was completed with the patient.  ? ?Scheduled Inpatient Medications: ?  ? insulin aspart  0-15 Units Subcutaneous Q4H  ? insulin glargine-yfgn  8 Units Subcutaneous Daily  ? [START ON 09/04/2021] levothyroxine  75 mcg Intravenous Daily  ? sodium chloride flush  5 mL Intracatheter Q8H  ? ? ?Continuous Inpatient Infusions: ?  ? sodium chloride 100 mL/hr at 09/01/21 0925  ? cefTRIAXone (ROCEPHIN)  IV    ? piperacillin-tazobactam (ZOSYN)  IV 3.375 g (09/01/21 5809)  ? ? ?PRN Inpatient Medications:  ?acetaminophen **OR** acetaminophen, menthol-cetylpyridinium, metoprolol tartrate, morphine injection, ondansetron **OR** ondansetron (ZOFRAN) IV ? ?Family History: ?family history includes CVA in her mother; Diabetes in her sister and sister; Heart disease in her father; Hypertension in her mother, sister, and sister; Ulcers in her mother.  The patient's family history is negative for inflammatory bowel disorders, GI malignancy, or solid organ transplantation. ? ?Social History:  ? reports that she has never smoked. She has never used smokeless tobacco. She reports that she does not drink alcohol  and does not use drugs. The patient denies ETOH, tobacco, or drug use.  ? ?Review of Systems: ?Constitutional: Weight is stable.  ?Eyes: No changes in vision. ?ENT: No oral lesions, sore throat.  ?GI: see HPI.  ?Heme/Lymph: No easy bruising.  ?CV: No chest pain.  ?GU: No hematuria.  ?Integumentary: No rashes.  ?Neuro: No headaches.  ?Psych: No depression/anxiety.  ?Endocrine: No heat/cold intolerance.  ?Allergic/Immunologic: No urticaria.  ?Resp: No cough, SOB.  ?Musculoskeletal: No joint swelling.  ?  ?Physical Examination: ?BP 139/78 (BP Location: Right Arm)   Pulse 68   Temp 98.5 ?F (36.9 ?C) (Axillary)   Resp 16   Ht 5' 3.5" (1.613 m)   Wt 64 kg   SpO2 97%   BMI 24.59 kg/m?  ?Gen: NAD, alert and oriented x 4, anxious-appearing, withdrawn ?HEENT: PEERLA, EOMI, ?Neck: supple, no JVD or thyromegaly ?Chest: CTA bilaterally, no wheezes, crackles, or other adventitious sounds ?CV: RRR, no m/g/c/r ?Abd: soft, ND, +BS in all four quadrants; nontender to palpation in all four quadrants, drain in place with yellow bile in drainage bag, bandage clean, dry, intact with no induration, no HSM, guarding, ridigity, or rebound tenderness ?Ext: no edema, well perfused with 2+ pulses, ?Skin: no rash or lesions noted ?Lymph: no LAD ? ?Data: ?Lab Results  ?Component Value Date  ? WBC 11.1 (H) 09/01/2021  ? HGB 13.8 09/01/2021  ? HCT 41.4 09/01/2021  ? MCV 89.2 09/01/2021  ? PLT 260 09/01/2021  ? ?Recent Labs  ?Lab 08/30/21 ?0431 08/31/21 ?0426 09/01/21 ?0411  ?HGB 12.9 13.4 13.8  ? ?Lab Results  ?Component Value Date  ? NA 140 09/01/2021  ? K 3.9 09/01/2021  ? CL 109 09/01/2021  ? CO2 23 09/01/2021  ? BUN 26 (H) 09/01/2021  ? CREATININE 0.74 09/01/2021  ? GLU 111 09/24/2014  ? ?Lab Results  ?Component Value Date  ? ALT 107 (H) 09/01/2021  ? AST 67 (H) 09/01/2021  ? ALKPHOS 146 (H) 09/01/2021  ? BILITOT 5.0 (H) 09/01/2021  ? ?Recent Labs  ?Lab 08/31/21 ?9476  ?INR 1.1  ? ?MRI/MRCP wo contrast 08/30/21: ?IMPRESSION: ?Suspected  masslike duodenal wall thickening extending to the ?ampulla, worrisome for duodenal/ampullary carcinoma. Consider ?endoscopy/EUS for further evaluation. ?  ?Associated intrahepatic/extrahepatic ductal dilatation. Common duct ?measures 18 mm. No choledocholithiasis is seen. ?  ?Associated prominent pancreatic duct, extending to the ampulla, ?measuring 6 mm. ?  ?Associated duodenal narrowing/stricture with suspected functional ?gastric outlet obstruction. ?  ?2.3 cm irregular/spiculated lesion in the left inferior breast, ?suspicious for primary breast neoplasm. Mammographic correlation is ?suggested. ? ?AFP <1.8 ?CA 19-9 347 ?  ? ?Assessment/Plan: ?66 y/o Caucasian female with a PMH of HTN, hypothyroidism, diabetes mellitus, Hx of cholecystectomy 06/2015, and anxiety admitted to [facility] with elevated LFTs, gastric outlet obstruction 2/2 duodenal stricture and obstructive jaundice with concerns for  ? ?Obstructive jaundice 2/2 biliary obstruction from duodenal/ampullary mass ? 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 ?Left breast mass - primary team following, recommend f/u mammogram  ? ?Hx of cholecystectomy ? ?Abnormal LFTs - 2/2 #1, downtrending  ? ?Hypokalemia - resolved  ? ?Recommendations: ? ?- Dr. Virgina Jock has spoken with pathologist today and we are hoping to have final results by tomorrow with stains  currently pending  ?- CA 19-9 elevated at 347 which also raises concern for malignancy ?- LFTs downtrending as expected with drain placement which is reassuring ?- Repeat IR procedure tomorrow to adjust drain with possible brushings (have been requested) ?- Continue supportive measures with pain control and anti-emetics  ?- Keep HOB elevated at 30 degrees  ?- Keep NGT in place on low intermittent wall suction  ?- Can allow pt to rinse her mouth out, have some ice chips as this will be suctioned out via ngt, however, enough to keep her mouth moist for comfort ?- Continue serial abdominal examinations  ?- If  malignancy is confirmed, will ultimately require transfer to tertiary center for hepatobiliary surgery ?- Plan of care was discussed and reviewed with Dr. Virgina Jock who was in agreement ?- GI will continue to follow along with you

## 2021-09-01 NOTE — Progress Notes (Signed)
?Progress Note ? ? ?Patient: Diamond Hinton JYN:[id number] DOB: [birth year] DOA: 08/29/2021     3 ?DOS: the patient was seen and examined on 09/01/2021 ?  ?Brief hospital course: ?This 66 years old female with PMH significant for diabetes, hypertension, hypothyroidism, prior cholecystectomy sent by her PCP with abnormal labs notably elevated liver enzymes.  Patient reports having gastric reflux symptoms for about a month and has been receiving treatment with famotidine, sucralfate but her symptoms had not improved.  Work-up in the ED shows elevated AST ALT and total bilirubin, lipase 395, UA: Large LE.  CT abdomen and pelvis shows marked distention of the stomach finding suggestive of possible benign or malignant stricture.  Endoscopic evaluation recommended.  Left breast mass with slightly spiculated margins consider outpatient mammogram.   ? ?GI was consulted.  They attempted ERCP on 3/21, but unable to proceed due to food and liquid residue in the stomach.  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   ? ?IR consulted for biliary drain placement on 3/22. ? ?Assessment and Plan: ?* Obstructive jaundice ?Presented with jaundice, with history of prior cholecystectomy, with elevated LFTs, lipase. ?GI was consulted and recommended ERCP. ?ERCP was attempted on 3/21 but unable to be performed due to food and liquid residue within the stomach.  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 ?--Further evaluation as outlined. ?--Continue pain control as needed. ?--Continue IV Zofran for nausea and vomiting. ?--Continue IV hydration. ? ?Elevated lipase ?No focal abnormality seen in the pancreas on CT.  Suspect related to obstructive process. ?-- Biliary drain placed by IR on 3/22 ? ?Gastric outlet obstruction ?CT  A/P: Shows gastric distention with possible pyloric stenosis. ?Severe stricture in the first portion of the duodenum noted during attempted ERCP on 3/21,  biopsied. ? ?MRCP on 3/21  ?IMPRESSION: ?--Suspected masslike duodenal wall thickening extending to the ?ampulla, worrisome for duodenal/ampullary carcinoma. Consider ?endoscopy/EUS for further evaluation. ?--Associated intrahepatic/extrahepatic ductal dilatation. Common duct ?measures 18 mm. No choledocholithiasis is seen. ?--Associated prominent pancreatic duct, extending to the ampulla, ?measuring 6 mm. ?--Associated duodenal narrowing/stricture with suspected functional ?gastric outlet obstruction. ?--2.3 cm irregular/spiculated lesion in the left inferior breast, ?suspicious for primary breast neoplasm. Mammographic correlation is ?suggested. ? ?Continue NPO.  ?NG tube in place. ?Biliary drain placed by IR 3/22. ?Aspiration precautions. ?Management as above ?Continue IV Protonix IV ?Follow-up biopsy pathology ? ?Likely to require hepatobiliary surgery given duodenal obstruction. ? ?Uncontrolled type 2 diabetes mellitus with hyperglycemia, without long-term current use of insulin ([place]) ?Sliding scale insulin coverage ? ?History of cholecystectomy ?Noted history in the setting of current suspected obstructive jaundice ? ?Left breast mass ?Noted to have spiculated mass in left breast with recommendation for follow-up with mammogram. ? ?UTI (urinary tract infection) ?Patient treated with Rocephin in the ED. ?She denies any urinary symptoms. ?No further antibiotics indicated at this time. ?Monitor for symptoms. ? ?Abnormal LFTs ?Hepatic steatosis on CT with dilatation of intra and extra biliary ducts.   ?Concern for malignant process, as outlined. ?--Follow CMP ? ?Stenosis of duodenum ?Biopsy pending.   ?Biliary drain placed by IR 3/22. ?Follow-up GI and IR recommendations ? ?Hypokalemia ?IV repletion while n.p.o. and monitor ? ?Hypertension ?Continue IV hydralazine as needed while n.p.o. ? ?Adult hypothyroidism ?On IV levothyroxine while n.p.o. ? ? ? ? ?  ? ?Subjective: Patient awake sitting up in bed, visiting  with sister at bedside this morning.  She reports feeling better  than she did yesterday.  Less abdominal pain and no nausea today.  Appreciates having ice chips for very dry mouth.  She reports waking up this morning feeling as though her biopsy results will return showing malignancy.  We discussed that scenario and what next steps would be. ? ?Physical Exam: ?Vitals:  ? 08/31/21 1817 08/31/21 1931 09/01/21 0401 09/01/21 1008  ?BP:  (!) 154/92 (!) 151/80 139/78  ?Pulse:  63 63 68  ?Resp: _0 ?Temp:  98.5 ?F (36.9 ?C) 98.5 ?F (36.9 ?C) 98.5 ?F (36.9 ?C)  ?TempSrc:  Axillary Oral Axillary  ?SpO2:  97% 96% 97%  ?Weight:      ?Height:      ? ?General exam: awake, alert, no acute distress ?HEENT: NG tube in place, moist mucus membranes, hearing grossly normal  ?Respiratory system: CTAB, no wheezes, rales or rhonchi, normal respiratory effort. ?Cardiovascular system: normal S1/S2, RRR, no pedal edema.   ?Gastrointestinal system: Percutaneous biliary drain noted with dark amber fluid in bag, soft, nondistended abdomen. ?Central nervous system: A&O x4. no gross focal neurologic deficits, normal speech ?Extremities: moves all, no edema, normal tone ?Skin: dry, intact, normal temperature ?Psychiatry: normal mood, congruent affect, judgement and insight appear normal ? ? ? ?Data Reviewed: ? ?Labs reviewed are notable for glucose 141, BUN 26, calcium 8.8, alk phos 146, albumin 3.0, 6 AST 67, ALT 107, total protein 6.1, total bili improved to 5.0, WBC 11.1 ? ?Family Communication: Sister at bedside on rounds ? ?Disposition: ?Status is: Inpatient ?Remains inpatient appropriate because: Severity of illness with ongoing evaluation, duodenal obstruction with NG tube in place and n.p.o. ? ? Planned Discharge Destination: Home ? ? ? ?Time spent: 40 minutes ? ?Author: ?Ezekiel Slocumb, DO ?09/01/2021 2:24 PM ? ?For on call review [email or web address].  ?

## 2021-09-01 NOTE — Plan of Care (Signed)

## 2021-09-01 NOTE — Progress Notes (Signed)
Saw patient eating ice chips. Informed her that we have no order for her to have ice chips. She stated that the nurse on day shift brought her ice chips after placing the NGT. Educated patient about MD orders.  ?

## 2021-09-01 NOTE — Plan of Care (Signed)
  Problem: Health Behavior/Discharge Planning: Goal: Ability to manage health-related needs will improve Outcome: Progressing   

## 2021-09-02 ENCOUNTER — Inpatient Hospital Stay: Payer: Medicare HMO | Admitting: Radiology

## 2021-09-02 ENCOUNTER — Inpatient Hospital Stay: Payer: Medicare HMO

## 2021-09-02 DIAGNOSIS — K831 Obstruction of bile duct: Secondary | ICD-10-CM | POA: Diagnosis not present

## 2021-09-02 HISTORY — PX: IR CONVERT BILIARY DRAIN TO INT EXT BILIARY DRAIN: IMG6045

## 2021-09-02 LAB — CBC
HCT: 42.3 % (ref 36.0–46.0)
Hemoglobin: 14.3 g/dL (ref 12.0–15.0)
MCH: 29.9 pg (ref 26.0–34.0)
MCHC: 33.8 g/dL (ref 30.0–36.0)
MCV: 88.3 fL (ref 80.0–100.0)
Platelets: 274 10*3/uL (ref 150–400)
RBC: 4.79 MIL/uL (ref 3.87–5.11)
RDW: 16.6 % — ABNORMAL HIGH (ref 11.5–15.5)
WBC: 10.4 10*3/uL (ref 4.0–10.5)
nRBC: 0 % (ref 0.0–0.2)

## 2021-09-02 LAB — GLUCOSE, CAPILLARY
Glucose-Capillary: 123 mg/dL — ABNORMAL HIGH (ref 70–99)
Glucose-Capillary: 125 mg/dL — ABNORMAL HIGH (ref 70–99)
Glucose-Capillary: 129 mg/dL — ABNORMAL HIGH (ref 70–99)
Glucose-Capillary: 136 mg/dL — ABNORMAL HIGH (ref 70–99)
Glucose-Capillary: 164 mg/dL — ABNORMAL HIGH (ref 70–99)

## 2021-09-02 LAB — COMPREHENSIVE METABOLIC PANEL
ALT: 118 U/L — ABNORMAL HIGH (ref 0–44)
AST: 73 U/L — ABNORMAL HIGH (ref 15–41)
Albumin: 3.1 g/dL — ABNORMAL LOW (ref 3.5–5.0)
Alkaline Phosphatase: 142 U/L — ABNORMAL HIGH (ref 38–126)
Anion gap: 8 (ref 5–15)
BUN: 28 mg/dL — ABNORMAL HIGH (ref 8–23)
CO2: 25 mmol/L (ref 22–32)
Calcium: 8.6 mg/dL — ABNORMAL LOW (ref 8.9–10.3)
Chloride: 107 mmol/L (ref 98–111)
Creatinine, Ser: 0.81 mg/dL (ref 0.44–1.00)
GFR, Estimated: >60 mL/min (ref >60)
Glucose, Bld: 133 mg/dL — ABNORMAL HIGH (ref 70–99)
Potassium: 3.4 mmol/L — ABNORMAL LOW (ref 3.5–5.1)
Sodium: 140 mmol/L (ref 135–145)
Total Bilirubin: 5 mg/dL — ABNORMAL HIGH (ref 0.3–1.2)
Total Protein: 6.7 g/dL (ref 6.5–8.1)

## 2021-09-02 MED ORDER — MIDAZOLAM HCL 2 MG/2ML IJ SOLN
INTRAMUSCULAR | Status: AC
  Filled 2021-09-02: qty 2

## 2021-09-02 MED ORDER — IOHEXOL 350 MG/ML SOLN: 50.0000 mL | Freq: Once | INTRAVENOUS | Status: DC | PRN

## 2021-09-02 MED ORDER — FENTANYL CITRATE (PF) 100 MCG/2ML IJ SOLN
INTRAMUSCULAR | Status: AC
  Filled 2021-09-02: qty 2

## 2021-09-02 MED ORDER — POTASSIUM CHLORIDE 10 MEQ/100ML IV SOLN
10.0000 meq | INTRAVENOUS | Status: AC
  Administered 2021-09-02 (×3): 10 meq via INTRAVENOUS
  Filled 2021-09-02 (×3): qty 100

## 2021-09-02 MED ORDER — FENTANYL CITRATE (PF) 100 MCG/2ML IJ SOLN
INTRAMUSCULAR | Status: AC | PRN
Start: 2021-09-02 — End: 2021-09-02
  Administered 2021-09-02 (×3): 50 ug via INTRAVENOUS

## 2021-09-02 MED ORDER — IOHEXOL 300 MG/ML  SOLN
75.0000 mL | Freq: Once | INTRAMUSCULAR | Status: AC | PRN
Start: 2021-09-02 — End: 2021-09-02
  Administered 2021-09-02: 75 mL via INTRAVENOUS

## 2021-09-02 MED ORDER — MIDAZOLAM HCL 2 MG/2ML IJ SOLN
INTRAMUSCULAR | Status: AC | PRN
  Administered 2021-09-02 (×2): 1 mg via INTRAVENOUS

## 2021-09-02 MED ORDER — ONDANSETRON HCL 4 MG/2ML IJ SOLN
INTRAMUSCULAR | Status: AC | PRN
  Administered 2021-09-02: 4 mg via INTRAVENOUS

## 2021-09-02 MED ORDER — SODIUM CHLORIDE 0.9% FLUSH
5.0000 mL | Freq: Three times a day (TID) | INTRAVENOUS | Status: DC
  Administered 2021-09-02 – 2021-09-14 (×30): 5 mL

## 2021-09-02 MED ORDER — LIDOCAINE HCL 1 % IJ SOLN
INTRAMUSCULAR | Status: AC
  Administered 2021-09-02: 8 mL
  Filled 2021-09-02: qty 20

## 2021-09-02 MED ORDER — SODIUM CHLORIDE 0.9 % IV SOLN: INTRAVENOUS | Status: DC | PRN

## 2021-09-02 NOTE — Care Management Important Message (Signed)
Important Message ? ?Patient Details  ?Name: Diamond Hinton ?MRN: [id number] ?Date of Birth: [birth year] ? ? ?Medicare Important Message Given:  Yes ? ? ? ? ?Dannette Barbara ?09/02/2021, 12:42 PM ?

## 2021-09-02 NOTE — Consult Note (Signed)
? ?Hematology/Oncology Consult note ?[facility] ?[phone] [phone] ? ?Patient Care Team: ?Virginia Crews, MD as PCP - General (Family Medicine)  ? ?Name of the patient: Diamond Hinton  ?[id number]  ?12-02-1955  ? ? ?Reason for consult: Obstructive jaundice likely secondary to duodenal mass from metastatic breast cancer ?  ?Requesting physician: Dr. Nicole Kindred ? ?Date of visit: 09/02/2021 ? ? ? ?History of presenting illness-patient is a 66 year old female who wasAdmitted to the hospital with symptoms of jaundice and right upper quadrant abdominal pain.  On admission she was noted to have elevated bilirubin of 9.6 with an ALT AST of 96/162 respectively and alkaline phosphatase of 186.  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  Intra and extrahepatic ductal dilatation.  2.3 cm spiculated lesion in the left breast concerning for primary breast neoplasm. Patient underwent upper endoscopy by Dr. Allen Norris which showed large amount of food residue in the stomach.  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  Differential diagnosis includes lobular breast carcinoma versus GI signet ring carcinoma.  Immunohistochemical stains ER/PR and HER2 are currently pending.  CA 19-9 is elevated at 347. ? ?ECOG PS- 2 ? ?Pain scale- 0 ? ? ?Review of systems- Review of Systems  ?Constitutional:  Positive for malaise/fatigue.  ? ?Allergies  ?Allergen Reactions  ? Peanuts [Peanut Oil] Shortness Of Breath and Itching  ? ? ?Patient Active Problem List  ? Diagnosis Date Noted  ? Stenosis of duodenum   ?  Jaundice 08/29/2021  ? Right upper quadrant abdominal pain 08/29/2021  ? Rash 08/29/2021  ? Obstructive jaundice 08/29/2021  ? UTI (urinary tract infection) 08/29/2021  ? Metastatic breast cancer ([place]) 08/29/2021  ? History of cholecystectomy 08/29/2021  ? Gastric outlet obstruction 08/29/2021  ? Hypokalemia 08/29/2021  ? Uncontrolled type 2 diabetes mellitus with hyperglycemia, without long-term current use of insulin ([place]) 08/29/2021  ? Elevated lipase 08/29/2021  ? Reflux gastritis 08/04/2021  ? Diabetes mellitus without complication ([place]) 14/78/2956  ? Hypertension 04/18/2021  ? Overweight 01/15/2019  ? Hyperlipidemia associated with type 2 diabetes mellitus ([place]) 02/19/2015  ? Abnormal LFTs 02/19/2015  ? Adult hypothyroidism 02/19/2015  ? ? ? ?Past Medical History:  ?Diagnosis Date  ? Anemia   ? Anxiety   ? Gallstones   ? GERD (gastroesophageal reflux disease)   ? Hyperlipidemia   ? Hypertension   ? ? ? ?Past Surgical History:  ?Procedure Laterality Date  ? BILATERAL SALPINGECTOMY  04/03/1997  ? CHOLECYSTECTOMY N/A 07/07/2015  ? Procedure: LAPAROSCOPIC CHOLECYSTECTOMY ;  Surgeon: Jules Husbands, MD;  Location: [facility] ORS;  Service: General;  Laterality: N/A;  ? ESOPHAGOGASTRODUODENOSCOPY  08/30/2021  ? Procedure: ESOPHAGOGASTRODUODENOSCOPY (EGD);  Surgeon: Lucilla Lame, MD;  Location: [facility] ENDOSCOPY;  Service: Endoscopy;;  ? GALLBLADDER SURGERY  07/07/2015  ? [place]  ? IR BILIARY DRAIN PLACEMENT WITH CHOLANGIOGRAM  08/31/2021  ? IR CONVERT BILIARY DRAIN TO INT EXT BILIARY DRAIN  09/02/2021  ? ? ?Social History  ? ?Socioeconomic History  ? Marital status: Married  ?  Spouse name: Not on file  ? Number of children: Not on file  ? Years of education: Not on file  ?  Highest education level: Not on file  ?Occupational History  ? Not on file  ?Tobacco Use  ? Smoking status: Never  ? Smokeless tobacco: Never  ?Vaping Use  ? Vaping Use: Never used  ?Substance and Sexual Activity  ? Alcohol use: No  ? Drug use: No  ? Sexual  activity: Yes  ?Other Topics Concern  ? Not on file  ?Social History Narrative  ? Not on file  ? ?Social Determinants of Health  ? ?Financial Resource Strain: Not on file  ?Food Insecurity: Not on file  ?Transportation Needs: Not on file  ?Physical Activity: Not on file  ?Stress: Not on file  ?Social Connections: Not on file  ?Intimate Partner Violence: Not on file  ? ?  ?Family History  ?Problem Relation Age of Onset  ? Hypertension Mother   ? CVA Mother   ? Ulcers Mother   ? Heart disease Father   ? Hypertension Sister   ? Diabetes Sister   ? Hypertension Sister   ? Diabetes Sister   ? ? ? ?Current Facility-Administered Medications:  ?  0.9 %  sodium chloride infusion, , Intravenous, Continuous, Athena Masse, MD, Last Rate: 100 mL/hr at 09/02/21 1422, Infusion Verify at 09/02/21 1422 ?  acetaminophen (TYLENOL) tablet 650 mg, 650 mg, Oral, Q6H PRN **OR** acetaminophen (TYLENOL) suppository 650 mg, 650 mg, Rectal, Q6H PRN, Athena Masse, MD ?  fentaNYL (SUBLIMAZE) 100 MCG/2ML injection, , , ,  ?  fentaNYL (SUBLIMAZE) 100 MCG/2ML injection, , , ,  ?  insulin aspart (novoLOG) injection 0-15 Units, 0-15 Units, Subcutaneous, Q4H, Athena Masse, MD, 3 Units at 09/02/21 0857 ?  insulin glargine-yfgn (SEMGLEE) injection 8 Units, 8 Units, Subcutaneous, Daily, Shawna Clamp, MD, 8 Units at 09/02/21 [phone] ?  iohexol (OMNIPAQUE) 350 MG/ML injection 50 mL, 50 mL, Per Tube, Once PRN, Greggory Keen, MD ?  Derrill Memo ON 09/04/2021] levothyroxine (SYNTHROID, LEVOTHROID) injection 37.5 mcg, 37.5 mcg, Intravenous, Daily, Dallie Piles, RPH ?  menthol-cetylpyridinium (CEPACOL) lozenge 3 mg, 1 lozenge, Oral, PRN, Nicole Kindred A, DO ?  metoprolol tartrate (LOPRESSOR) injection 5 mg, 5 mg, Intravenous, Q6H PRN, Athena Masse, MD ?  midazolam (VERSED) 2 MG/2ML injection, , , ,  ?  morphine (PF) 2 MG/ML injection 2 mg, 2 mg, Intravenous, Q2H PRN, Athena Masse, MD, 2 mg at 08/31/21 2126 ?  ondansetron (ZOFRAN) tablet 4 mg, 4  mg, Oral, Q6H PRN **OR** ondansetron (ZOFRAN) injection 4 mg, 4 mg, Intravenous, Q6H PRN, Athena Masse, MD, 4 mg at 09/02/21 0436 ?  piperacillin-tazobactam (ZOSYN) IVPB 3.375 g, 3.375 g, Intravenous, Q8H, Annamaria Helling, DO, Last Rate: 12.5 mL/hr at 09/02/21 1422, Infusion Verify at 09/02/21 1422 ?  potassium chloride 10 mEq in 100 mL IVPB, 10 mEq, Intravenous, Q1 Hr x 3, Griffith, Kelly A, DO ?  sodium chloride flush (NS) 0.9 % injection 5 mL, 5 mL, Intracatheter, Q8H, Greggory Keen, MD, 5 mL at 09/02/21 1355 ? ? ?Physical exam:  ?Vitals:  ? 09/02/21 1115 09/02/21 1135 09/02/21 1147 09/02/21 1503  ?BP: (!) 141/72 135/77 139/77 (!) 159/78  ?Pulse: (!) 55 63 (!) 58 61  ?Resp: 13 (!) 21 16 16   ?Temp:   98.4 ?F (36.9 ?C) 97.7 ?F (36.5 ?C)  ?TempSrc:   Oral   ?SpO2: 98% 92% 98% 98%  ?Weight:      ?Height:      ? ?Physical Exam ?Constitutional:   ?   Comments: NG tube in place  ?  Eyes:  ?   General: Scleral icterus present.  ?Cardiovascular:  ?   Rate and Rhythm: Normal rate and regular rhythm.  ?   Heart sounds: Normal heart sounds.  ?Pulmonary:  ?   Effort: Pulmonary effort is normal.  ?   Breath sounds: Normal breath sounds.  ?Abdominal:  ?   General: Bowel sounds are normal.  ?   Palpations: Abdomen is soft.  ?   Comments: PTCA in place  ?Musculoskeletal:  ?   Cervical back: Normal range of motion.  ?Skin: ?   General: Skin is warm and dry.  ?Neurological:  ?   Mental Status: She is alert and oriented to person, place, and time.  ?  ? ? ? ? ?  Latest Ref Rng & Units 09/02/2021  ?  3:47 AM  ?CMP  ?Glucose 70 - 99 mg/dL 133    ?BUN 8 - 23 mg/dL 28    ?Creatinine 0.44 - 1.00 mg/dL 0.81    ?Sodium 135 - 145 mmol/L 140    ?Potassium 3.5 - 5.1 mmol/L 3.4    ?Chloride 98 - 111 mmol/L 107    ?CO2 22 - 32 mmol/L 25    ?Calcium 8.9 - 10.3 mg/dL 8.6    ?Total Protein 6.5 - 8.1 g/dL 6.7    ?Total Bilirubin 0.3 - 1.2 mg/dL 5.0    ?Alkaline Phos 38 - 126 U/L 142    ?AST 15 - 41 U/L 73    ?ALT 0 - 44 U/L 118    ? ? ?   Latest Ref Rng & Units 09/02/2021  ?  3:47 AM  ?CBC  ?WBC 4.0 - 10.5 K/uL 10.4    ?Hemoglobin 12.0 - 15.0 g/dL 14.3    ?Hematocrit 36.0 - 46.0 % 42.3    ?Platelets 150 - 400 K/uL 274    ? ? ?@IMAGES @ ? ?CT Abdomen

## 2021-09-02 NOTE — Progress Notes (Addendum)
?Progress Note ? ? ?Patient: Diamond Hinton PXT:[id number] DOB: 1955-11-21 DOA: 08/29/2021     4 ?DOS: the patient was seen and examined on 09/02/2021 ?  ?Brief hospital course: ?This 66 years old female with PMH significant for diabetes, hypertension, hypothyroidism, prior cholecystectomy sent by her PCP with abnormal labs notably elevated liver enzymes.  Patient reports having gastric reflux symptoms for about a month and has been receiving treatment with famotidine, sucralfate but her symptoms had not improved.  Work-up in the ED shows elevated AST ALT and total bilirubin, lipase 395, UA: Large LE.  CT abdomen and pelvis shows marked distention of the stomach finding suggestive of possible benign or malignant stricture.  Endoscopic evaluation recommended.  Left breast mass with slightly spiculated margins consider outpatient mammogram.   ? ?GI was consulted.  They attempted ERCP on 3/21, but unable to proceed due to food and liquid residue in the stomach.  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   ? ?IR consulted for biliary drain placement on 3/22, but unable to advance drain past sphincter of Oddi. ?Returned to IR on 3/24 for advancement of drain.   ? ?Preliminary biopsy results showing this is likely metastatic breast cancer.  Surgery consulted for consideration of a J tube for nutrition.  Oncology also consulted. ? ?Assessment and Plan: ?* Obstructive jaundice ?Presented with jaundice, with history of prior cholecystectomy, with elevated LFTs, lipase. ?GI was consulted and recommended ERCP. ?ERCP was attempted on 3/21 but unable to be performed due to food and liquid residue within the stomach.  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 ?--Further evaluation as outlined. ?--Continue pain control as needed. ?--Continue IV Zofran for nausea and vomiting. ?--Continue IV hydration. ? ?Elevated lipase ?No focal abnormality seen in the pancreas on CT.   Suspect related to obstructive process. ?-- Biliary drain placed by IR on 3/22 ? ?Gastric outlet obstruction ?CT  A/P: Shows gastric distention with possible pyloric stenosis. ?Severe stricture in the first portion of the duodenum noted during attempted ERCP on 3/21, biopsied. ? ?MRCP on 3/21  ?IMPRESSION: ?--Suspected masslike duodenal wall thickening extending to the ?ampulla, worrisome for duodenal/ampullary carcinoma. Consider ?endoscopy/EUS for further evaluation. ?--Associated intrahepatic/extrahepatic ductal dilatation. Common duct ?measures 18 mm. No choledocholithiasis is seen. ?--Associated prominent pancreatic duct, extending to the ampulla, ?measuring 6 mm. ?--Associated duodenal narrowing/stricture with suspected functional ?gastric outlet obstruction. ?--2.3 cm irregular/spiculated lesion in the left inferior breast, ?suspicious for primary breast neoplasm. Mammographic correlation is ?suggested. ? ?Continue NPO.  ?NG tube in place. ?Biliary drain placed by IR 3/22. ?Aspiration precautions. ?Management as above ?Continue IV Protonix IV ?Follow-up biopsy pathology ? ?Likely to require hepatobiliary surgery given duodenal obstruction. ? ?Metastatic breast cancer ([place]) ?New diagnosis this admission.  Presented with biliary obstructive symptoms and noted to have spiculated mass in left breast on imaging upon admission.  Now appears this is metastatic breast malignancy, based on preliminary pathology results GI's endoscopic biopsy obtained during attempted ERCP. ?--Oncology consulted ?--Hormone receptors added to path orders ?-- Goals of care discussions underway ? ?Uncontrolled type 2 diabetes mellitus with hyperglycemia, without long-term current use of insulin ([place]) ?Sliding scale insulin coverage ? ?History of cholecystectomy ?Noted history in the setting of current suspected obstructive jaundice ? ?UTI (urinary tract infection) ?Patient treated with Rocephin in the ED. ?She denies any urinary  symptoms. ?No further antibiotics indicated at this time. ?Monitor for symptoms. ? ?Abnormal LFTs ?Hepatic steatosis  on CT with dilatation of intra and extra biliary ducts.   ?Concern for malignant process, as outlined. ?--Follow CMP ? ?Stenosis of duodenum ?Biopsy pending.   ?Biliary drain placed by IR 3/22. ?Follow-up GI and IR recommendations ? ?Hypokalemia ?IV repletion while n.p.o. and monitor ? ?3/24: K 3.4, will give K-riders IV 10 mEqx3 replacement. ? ?Hypertension ?Continue IV hydralazine as needed while n.p.o. ? ?Adult hypothyroidism ?On IV levothyroxine while n.p.o. ? ? ? ? ?  ? ?Subjective: Patient seen after procedure today with sister and granddaughter at bedside.  Gastroenterology's PA was at bedside informing patient of preliminary biopsy results showing primary breast malignancy.  Patient today reports overall feeling okay.  Nausea earlier improved.  Not having too much pain since they finished the procedure.  She is asking if she can chew on gum or suck on hard candy.  We discussed oncology to consult and explain prognosis and potential treatment options, further biopsy information still pending that we will determine this.  Patient agreeable to J-tube for nutrition at this point. ? ?Physical Exam: ?Vitals:  ? 09/02/21 1100 09/02/21 1115 09/02/21 1135 09/02/21 1147  ?BP: 139/77 (!) 141/72 135/77 139/77  ?Pulse:  (!) 55 63 (!) 58  ?Resp: 16 13 (!) 21 16  ?Temp:    98.4 ?F (36.9 ?C)  ?TempSrc:    Oral  ?SpO2: 95% 98% 92% 98%  ?Weight:      ?Height:      ? ?General exam: awake, alert, no acute distress ?HEENT: NG tube in place connected to wall suction, moist mucus membranes, hearing grossly normal  ?Respiratory system: normal respiratory effort, on room. ?Cardiovascular system: RRR, no pedal edema.   ?Gastrointestinal system: soft, NT, drain in place with dark amber appearing fluid in the bag. ?Central nervous system: A&O x4. no gross focal neurologic deficits, normal speech ?Extremities: moves all,  no edema, normal tone ?Skin: dry, intact, normal temperature ?Psychiatry: normal mood, congruent affect, judgement and insight appear normal ? ? ? ?Data Reviewed: ? ?Labs reviewed notable for potassium 3.4, glucose 133, BUN 28, calcium 8.6, alk phos 142, albumin 3.1, AST 73, ALT 118, total bili 5.0 stable.  CBC notable for resolved leukocytosis white count 10.4 ? ?Family Communication: Sister and granddaughter present at bedside ? ?Disposition: ?Status is: Inpatient ?Remains inpatient appropriate because: Ongoing evaluation and NG tube in place remaining n.p.o. ? ? ? Planned Discharge Destination: Home ? ? ? ?Time spent: 40 minutes ? ?Author: ?Ezekiel Slocumb, DO ?09/02/2021 2:41 PM ? ?For on call review [email or web address]  ?

## 2021-09-02 NOTE — Procedures (Signed)
Interventional Radiology Procedure Note ? ?Procedure: fluoro int ext 25f drain inserted after 450mPTA of the distal CBD/duodenal obstruction   ? ?Complications: None ? ?Estimated Blood Loss:  min ? ?Findings: ?1048frain to gravity bag drain   ? ?M. TREDaryll BrodD ? ? ? ?

## 2021-09-02 NOTE — Progress Notes (Signed)
Patient clinically stable post Biliary drain placement per Dr Annamaria Boots, tolerated well. Vitals stable pre and post procedure. Received Versed 2 mg along with Fentanyl 150 mcg IV for procedure. Awake/alert and oriented post procedure. Report given to Carlynn Spry RN post procedure/specials. Denies complaints at this time. ?

## 2021-09-02 NOTE — Progress Notes (Signed)
? ? ?GI Inpatient Progress Note ? ? ?HPI: ? ?Patient seen in follow-up for gastric outlet obstruction, elevated LFTs 2/2 biliary obstruction from duodenal/ampullary mass. No acute events overnight. She went back to IR this morning to change out her biliary drain to an internal external biliary drain with 4 mm dilatation of distal CBD. This was successful and she is having bile drainage in her bag. She denies any new complaints. She denies any nausea, vomiting, abdominal pain, or rectal bleeding. She has her other sister and granddaughter in the room this morning. She is anxious to get her pathology results back.  ? ?Past Medical History:  ?Past Medical History:  ?Diagnosis Date  ? Anemia   ? Anxiety   ? Gallstones   ? GERD (gastroesophageal reflux disease)   ? Hyperlipidemia   ? Hypertension   ?  ?Problem List: ?Patient Active Problem List  ? Diagnosis Date Noted  ? Stenosis of duodenum   ? Jaundice 08/29/2021  ? Right upper quadrant abdominal pain 08/29/2021  ? Rash 08/29/2021  ? Obstructive jaundice 08/29/2021  ? UTI (urinary tract infection) 08/29/2021  ? Left breast mass 08/29/2021  ? History of cholecystectomy 08/29/2021  ? Gastric outlet obstruction 08/29/2021  ? Hypokalemia 08/29/2021  ? Uncontrolled type 2 diabetes mellitus with hyperglycemia, without long-term current use of insulin (HCC) 08/29/2021  ? Elevated lipase 08/29/2021  ? Reflux gastritis 08/04/2021  ? Diabetes mellitus without complication (HCC) 04/18/2021  ? Hypertension 04/18/2021  ? Overweight 01/15/2019  ? Hyperlipidemia associated with type 2 diabetes mellitus (HCC) 02/19/2015  ? Abnormal LFTs 02/19/2015  ? Adult hypothyroidism 02/19/2015  ?  ?Past Surgical History: ?Past Surgical History:  ?Procedure Laterality Date  ? BILATERAL SALPINGECTOMY  04/03/1997  ? CHOLECYSTECTOMY N/A 07/07/2015  ? Procedure: LAPAROSCOPIC CHOLECYSTECTOMY ;  Surgeon: Diego F Pabon, MD;  Location: [facility] ORS;  Service: General;  Laterality: N/A;  ?  ESOPHAGOGASTRODUODENOSCOPY  08/30/2021  ? Procedure: ESOPHAGOGASTRODUODENOSCOPY (EGD);  Surgeon: Wohl, Darren, MD;  Location: [facility] ENDOSCOPY;  Service: Endoscopy;;  ? GALLBLADDER SURGERY  07/07/2015  ? [facility]  ? IR BILIARY DRAIN PLACEMENT WITH CHOLANGIOGRAM  08/31/2021  ? IR CONVERT BILIARY DRAIN TO INT EXT BILIARY DRAIN  09/02/2021  ?  ?Allergies: ?Allergies  ?Allergen Reactions  ? Peanuts [Peanut Oil] Shortness Of Breath and Itching  ?  ?Home Medications: ?Medications Prior to Admission  ?Medication Sig Dispense Refill Last Dose  ? famotidine (PEPCID) 10 MG tablet Take 1 tablet (10 mg total) by mouth 2 (two) times daily. 60 tablet 3 08/29/2021  ? hydrochlorothiazide (HYDRODIURIL) 25 MG tablet Take 1 tablet (25 mg total) by mouth daily. 90 tablet 3 08/29/2021  ? levothyroxine (SYNTHROID) 75 MCG tablet Take 1 tablet (75 mcg total) by mouth daily. 90 tablet 3 08/29/2021  ? ?Home medication reconciliation was completed with the patient.  ? ?Scheduled Inpatient Medications: ?  ? fentaNYL      ? fentaNYL      ? insulin aspart  0-15 Units Subcutaneous Q4H  ? insulin glargine-yfgn  8 Units Subcutaneous Daily  ? [START ON 09/04/2021] levothyroxine  37.5 mcg Intravenous Daily  ? midazolam      ? sodium chloride flush  5 mL Intracatheter Q8H  ? sodium chloride flush  5 mL Intracatheter Q8H  ? ? ?Continuous Inpatient Infusions: ?  ? sodium chloride 100 mL/hr at 09/02/21 1347  ? piperacillin-tazobactam (ZOSYN)  IV 12.5 mL/hr at 09/02/21 0747  ? ? ?PRN Inpatient Medications:  ?acetaminophen **OR** acetaminophen, iohexol, menthol-cetylpyridinium,   metoprolol tartrate, morphine injection, ondansetron **OR** ondansetron (ZOFRAN) IV ? ?Family History: ?family history includes CVA in her mother; Diabetes in her sister and sister; Heart disease in her father; Hypertension in her mother, sister, and sister; Ulcers in her mother.  The patient's family history is negative for inflammatory bowel disorders, GI malignancy, or solid organ  transplantation. ? ?Social History:  ? reports that she has never smoked. She has never used smokeless tobacco. She reports that she does not drink alcohol and does not use drugs. The patient denies ETOH, tobacco, or drug use.  ? ?Review of Systems: ?Constitutional: Weight is stable.  ?Eyes: No changes in vision. ?ENT: No oral lesions, sore throat.  ?GI: see HPI.  ?Heme/Lymph: No easy bruising.  ?CV: No chest pain.  ?GU: No hematuria.  ?Integumentary: No rashes.  ?Neuro: No headaches.  ?Psych: No depression/anxiety.  ?Endocrine: No heat/cold intolerance.  ?Allergic/Immunologic: No urticaria.  ?Resp: No cough, SOB.  ?Musculoskeletal: No joint swelling.  ?  ?Physical Examination: ?BP 139/77 (BP Location: Left Arm)   Pulse (!) 58   Temp 98.4 ?F (36.9 ?C) (Oral)   Resp 16   Ht 5' 3.5" (1.613 m)   Wt 64 kg   SpO2 98%   BMI 24.59 kg/m?  ?Gen: NAD, alert and oriented x 4, anxious-appearing ?HEENT: PEERLA, EOMI, NGT in place ?Neck: supple, no JVD or thyromegaly ?Chest: CTA bilaterally, no wheezes, crackles, or other adventitious sounds ?CV: RRR, no m/g/c/r ?Abd: soft, ND, +BS in all four quadrants; nontender to palpation in all four quadrants, drain in place with yellow bile in drainage bag, bandage clean, dry, intact with no induration, no HSM, guarding, ridigity, or rebound tenderness ?Ext: no edema, well perfused with 2+ pulses, ?Skin: no rash or lesions noted ?Lymph: no LAD ? ?Data: ?Lab Results  ?Component Value Date  ? WBC 10.4 09/02/2021  ? HGB 14.3 09/02/2021  ? HCT 42.3 09/02/2021  ? MCV 88.3 09/02/2021  ? PLT 274 09/02/2021  ? ?Recent Labs  ?Lab 08/31/21 ?0426 09/01/21 ?0411 09/02/21 ?9163  ?HGB 13.4 13.8 14.3  ? ?Lab Results  ?Component Value Date  ? NA 140 09/02/2021  ? K 3.4 (L) 09/02/2021  ? CL 107 09/02/2021  ? CO2 25 09/02/2021  ? BUN 28 (H) 09/02/2021  ? CREATININE 0.81 09/02/2021  ? GLU 111 09/24/2014  ? ?Lab Results  ?Component Value Date  ? ALT 118 (H) 09/02/2021  ? AST 73 (H) 09/02/2021  ?  ALKPHOS 142 (H) 09/02/2021  ? BILITOT 5.0 (H) 09/02/2021  ? ?Recent Labs  ?Lab 08/31/21 ?8466  ?INR 1.1  ? ?MRI/MRCP wo contrast 08/30/21: ?IMPRESSION: ?Suspected masslike duodenal wall thickening extending to the ?ampulla, worrisome for duodenal/ampullary carcinoma. Consider ?endoscopy/EUS for further evaluation. ?  ?Associated intrahepatic/extrahepatic ductal dilatation. Common duct ?measures 18 mm. No choledocholithiasis is seen. ?  ?Associated prominent pancreatic duct, extending to the ampulla, ?measuring 6 mm. ?  ?Associated duodenal narrowing/stricture with suspected functional ?gastric outlet obstruction. ?  ?2.3 cm irregular/spiculated lesion in the left inferior breast, ?suspicious for primary breast neoplasm. Mammographic correlation is ?suggested. ? ?AFP <1.8 ?CA 19-9 347 ? ?PATH RESULTS: ?DIAGNOSIS: ?A. DUODENAL MASS; COLD BIOPSY: ?- ADENOCARCINOMA WITH DIFFUSE INFILTRATIVE PATTERN AND SIGNET RING CELLS. Tumor cells are positive for cytokeratin and negative for lymphocyte markers. ER, PR, HER2, and GATA3(breast marker) have been ordered but results won't be back until Monday ?  ? ?Assessment/Plan: ?66 y/o Caucasian female with a PMH of HTN, hypothyroidism, diabetes mellitus, Hx of cholecystectomy  06/2015, and anxiety admitted to [facility] with elevated LFTs, gastric outlet obstruction 2/2 duodenal stricture and obstructive jaundice with concerns for  ? ?Obstructive jaundice 2/2 biliary obstruction from duodenal/ampullary mass - pathology confirmatory for adenocarcinoma with features to suggest metastatic cancer with breast primary  ? 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 ?Left breast mass - suspicious for metastatic breast cancer  ? ?Hx of cholecystectomy ? ?Abnormal LFTs - 2/2 #1, downtrending  ? ?Hypokalemia - resolved  ? ?Recommendations: ? ?- Discussed with Dr. Arbutus Ped, Dr. Dicie Beam (pathologist), and Dr. Janese Banks (Oncologist) about pathology results which confirmed adenocarcinoma with  concerns for metastatic disease with likely breast primary ?- Recommend Oncology referral for further discussion and evaluation. Dr. Janese Banks is planning on seeing the patient this afternoon. Unclear if surgery will be an option for patient based on

## 2021-09-02 NOTE — TOC Progression Note (Signed)
Transition of Care (TOC) - Progression Note  ? ? ?Patient Details  ?Name: Diamond Hinton ?MRN: [id number] ?Date of Birth: 1955/12/27 ? ?Transition of Care (TOC) CM/SW Contact  ?Beverly Sessions, RN ?Phone Number: ?09/02/2021, 1:14 PM ? ?Clinical Narrative:    ? ?Per rounds patient not medically stable for DC and MD to initiate transfer to [facility]  ? ?  ?  ? ?Expected Discharge Plan and Services ?  ?  ?  ?  ?  ?                ?  ?  ?  ?  ?  ?  ?  ?  ?  ?  ? ? ?Social Determinants of Health (SDOH) Interventions ?  ? ?Readmission Risk Interventions ?   ? View : No data to display.  ?  ?  ?  ? ? ?

## 2021-09-03 ENCOUNTER — Inpatient Hospital Stay: Payer: Self-pay

## 2021-09-03 DIAGNOSIS — K311 Adult hypertrophic pyloric stenosis: Secondary | ICD-10-CM

## 2021-09-03 DIAGNOSIS — K831 Obstruction of bile duct: Secondary | ICD-10-CM | POA: Diagnosis not present

## 2021-09-03 DIAGNOSIS — K315 Obstruction of duodenum: Secondary | ICD-10-CM | POA: Diagnosis not present

## 2021-09-03 LAB — COMPREHENSIVE METABOLIC PANEL
ALT: 149 U/L — ABNORMAL HIGH (ref 0–44)
AST: 100 U/L — ABNORMAL HIGH (ref 15–41)
Albumin: 3.1 g/dL — ABNORMAL LOW (ref 3.5–5.0)
Alkaline Phosphatase: 125 U/L (ref 38–126)
Anion gap: 9 (ref 5–15)
BUN: 29 mg/dL — ABNORMAL HIGH (ref 8–23)
CO2: 29 mmol/L (ref 22–32)
Calcium: 8.7 mg/dL — ABNORMAL LOW (ref 8.9–10.3)
Chloride: 104 mmol/L (ref 98–111)
Creatinine, Ser: 0.77 mg/dL (ref 0.44–1.00)
GFR, Estimated: >60 mL/min (ref >60)
Glucose, Bld: 109 mg/dL — ABNORMAL HIGH (ref 70–99)
Potassium: 3.7 mmol/L (ref 3.5–5.1)
Sodium: 142 mmol/L (ref 135–145)
Total Bilirubin: 4.9 mg/dL — ABNORMAL HIGH (ref 0.3–1.2)
Total Protein: 6.4 g/dL — ABNORMAL LOW (ref 6.5–8.1)

## 2021-09-03 LAB — TRIGLYCERIDES: Triglycerides: 220 mg/dL — ABNORMAL HIGH (ref <150)

## 2021-09-03 LAB — GLUCOSE, CAPILLARY
Glucose-Capillary: 103 mg/dL — ABNORMAL HIGH (ref 70–99)
Glucose-Capillary: 113 mg/dL — ABNORMAL HIGH (ref 70–99)
Glucose-Capillary: 115 mg/dL — ABNORMAL HIGH (ref 70–99)
Glucose-Capillary: 128 mg/dL — ABNORMAL HIGH (ref 70–99)
Glucose-Capillary: 130 mg/dL — ABNORMAL HIGH (ref 70–99)

## 2021-09-03 LAB — PHOSPHORUS: Phosphorus: 2.9 mg/dL (ref 2.5–4.6)

## 2021-09-03 MED ORDER — TRAVASOL 10 % IV SOLN
INTRAVENOUS | Status: DC
  Filled 2021-09-03: qty 518.4

## 2021-09-03 MED ORDER — SODIUM CHLORIDE 0.9% FLUSH: 10.0000 mL | INTRAVENOUS | Status: DC | PRN

## 2021-09-03 MED ORDER — INSULIN ASPART 100 UNIT/ML IJ SOLN
0.0000 [IU] | Freq: Four times a day (QID) | INTRAMUSCULAR | Status: DC
  Administered 2021-09-04: 3 [IU] via SUBCUTANEOUS
  Administered 2021-09-04 (×2): 5 [IU] via SUBCUTANEOUS
  Administered 2021-09-04: 3 [IU] via SUBCUTANEOUS
  Administered 2021-09-05 (×2): 5 [IU] via SUBCUTANEOUS
  Administered 2021-09-05 (×2): 8 [IU] via SUBCUTANEOUS
  Administered 2021-09-06 (×2): 11 [IU] via SUBCUTANEOUS
  Administered 2021-09-06: 8 [IU] via SUBCUTANEOUS
  Administered 2021-09-07 (×3): 15 [IU] via SUBCUTANEOUS
  Administered 2021-09-07 – 2021-09-08 (×3): 11 [IU] via SUBCUTANEOUS
  Administered 2021-09-08: 15 [IU] via SUBCUTANEOUS
  Administered 2021-09-09 (×2): 11 [IU] via SUBCUTANEOUS
  Filled 2021-09-03 (×20): qty 1

## 2021-09-03 MED ORDER — SODIUM CHLORIDE 0.9 % IV SOLN: INTRAVENOUS | Status: AC

## 2021-09-03 MED ORDER — CHLORHEXIDINE GLUCONATE CLOTH 2 % EX PADS
6.0000 | MEDICATED_PAD | Freq: Every day | CUTANEOUS | Status: DC
  Administered 2021-09-03 – 2021-09-14 (×11): 6 via TOPICAL

## 2021-09-03 MED ORDER — TRAVASOL 10 % IV SOLN
INTRAVENOUS | Status: AC
  Filled 2021-09-03: qty 518.4

## 2021-09-03 MED ORDER — SODIUM CHLORIDE 0.9% FLUSH
10.0000 mL | Freq: Two times a day (BID) | INTRAVENOUS | Status: DC
  Administered 2021-09-03 – 2021-09-13 (×18): 10 mL

## 2021-09-03 NOTE — Progress Notes (Signed)
?Progress Note ? ? ?Patient: Diamond Hinton FUX:[id number] DOB: [birth year] DOA: 08/29/2021     5 ?DOS: the patient was seen and examined on 09/03/2021 ?  ?Brief hospital course: ?This 66 years old female with PMH significant for diabetes, hypertension, hypothyroidism, prior cholecystectomy sent by her PCP with abnormal labs notably elevated liver enzymes.  Patient reports having gastric reflux symptoms for about a month and has been receiving treatment with famotidine, sucralfate but her symptoms had not improved.  Work-up in the ED shows elevated AST ALT and total bilirubin, lipase 395, UA: Large LE.  CT abdomen and pelvis shows marked distention of the stomach finding suggestive of possible benign or malignant stricture.  Endoscopic evaluation recommended.  Left breast mass with slightly spiculated margins consider outpatient mammogram.   ? ?GI was consulted.  They attempted ERCP on 3/21, but unable to proceed due to food and liquid residue in the stomach.  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   ? ?IR consulted for biliary drain placement on 3/22, but unable to advance drain past sphincter of Oddi. ?Returned to IR on 3/24 for advancement of drain.   ? ?Preliminary biopsy results showing this is likely metastatic breast cancer.  Surgery consulted for consideration of a J tube for nutrition.  Oncology also consulted. ? ?Assessment and Plan: ?* Obstructive jaundice ?Presented with jaundice, with history of prior cholecystectomy, with elevated LFTs, lipase. ?GI was consulted and recommended ERCP. ?ERCP was attempted on 3/21 but unable to be performed due to food and liquid residue within the stomach.  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 ?--Further evaluation as outlined. ?--Continue pain control as needed. ?--Continue IV Zofran for nausea and vomiting. ?--Continue IV hydration. ? ?Elevated lipase ?No focal abnormality seen in the pancreas on CT.   Suspect related to obstructive process. ?-- Biliary drain placed by IR on 3/22 ? ?Gastric outlet obstruction ?CT  A/P: Shows gastric distention with possible pyloric stenosis. ?Severe stricture in the first portion of the duodenum noted during attempted ERCP on 3/21, biopsied. ? ?MRCP on 3/21  ?IMPRESSION: ?--Suspected masslike duodenal wall thickening extending to the ?ampulla, worrisome for duodenal/ampullary carcinoma. Consider ?endoscopy/EUS for further evaluation. ?--Associated intrahepatic/extrahepatic ductal dilatation. Common duct ?measures 18 mm. No choledocholithiasis is seen. ?--Associated prominent pancreatic duct, extending to the ampulla, ?measuring 6 mm. ?--Associated duodenal narrowing/stricture with suspected functional ?gastric outlet obstruction. ?--2.3 cm irregular/spiculated lesion in the left inferior breast, ?suspicious for primary breast neoplasm. Mammographic correlation is ?suggested. ? ?Continue NPO.  ?NG tube in place to suction - try clamped today and monitor. ?PICC line to be placed. ?TPN for nutrition once PICC line in. ?Biliary drain placed by IR 3/22. ?Aspiration precautions. ?Management as above ?Continue IV Protonix IV ?Follow-up biopsy pathology ? ? ? ?Metastatic breast cancer ([place]) ?New diagnosis this admission.  Presented with biliary obstructive symptoms and noted to have spiculated mass in left breast on imaging upon admission.  Now appears this is metastatic breast malignancy, based on preliminary pathology results GI's endoscopic biopsy obtained during attempted ERCP. ?--Oncology consulted - see Dr. Elroy Channel recommendations ?--Hormone receptors added to path orders ?-- Goals of care discussions underway ? ?--L breast biopsy pending ?--NM bone scan pending ?--Hormone receptor's pending along with final pathology from duodenal biopsy. ? ? ?Uncontrolled type 2 diabetes mellitus with hyperglycemia, without long-term current use of insulin ([place]) ?Sliding scale insulin  coverage ? ?History of cholecystectomy ?Noted history in the  setting of current suspected obstructive jaundice ? ?UTI (urinary tract infection) ?Patient treated with Rocephin in the ED. ?She denies any urinary symptoms. ?Monitor for symptoms. ?On Zosyn with biliary drain. ? ?Abnormal LFTs ?Hepatic steatosis on CT with dilatation of intra and extra biliary ducts.   ?Concern for malignant process, as outlined. ?--Follow CMP ? ?Stenosis of duodenum ?Pathology results pending.   ?Biliary drain placed by IR 3/22. ?Follow-up surgery's recommendations for options to relieve the obstruction. ?Meantime requires NG tube to suction. ?Trial of NGT clamped today. ? ?Hypokalemia ?IV repletion while n.p.o. and monitor ? ?3/24: K 3.4, will give K-riders IV 10 mEqx3 replacement. ? ?Hypertension ?Continue IV hydralazine as needed while n.p.o. ? ?Adult hypothyroidism ?On IV levothyroxine while n.p.o. ? ? ? ? ?  ? ?Subjective: Patient awake sitting up in bed when seen today.  Reports overall feeling okay.  She is agreeable to PICC line for TPN for nutrition.  Denies any significant pain or nausea vomiting at this time.  No acute events reported. ? ?Physical Exam: ?Vitals:  ? 09/02/21 1920 09/03/21 0414 09/03/21 0724 09/03/21 1200  ?BP: (!) 159/83 (!) 156/88 (!) 155/84   ?Pulse: (!) 57 61 64   ?Resp: '16 16 18   '$ ?Temp: 98.5 ?F (36.9 ?C) 98.2 ?F (36.8 ?C) 97.8 ?F (36.6 ?C)   ?TempSrc: Oral Oral    ?SpO2: 99% 96% 97%   ?Weight:    59.7 kg  ?Height:      ? ?General exam: awake, alert, no acute distress ?HEENT: NG tube in place to wall suction, moist mucus membranes, hearing grossly normal  ?Respiratory system: normal respiratory effort, on room air ?Cardiovascular system: normal S1/S2, RRR, no pedal edema.   ?Gastrointestinal system: soft, NT, ND, biliary drain in place with dark amber fluid ?Central nervous system: A&O x4. no gross focal neurologic deficits, normal speech ?Extremities: moves all, no edema, normal tone ?Skin: dry, intact,  normal temperature, still mildly jaundiced ?Psychiatry: normal mood, congruent affect, judgement and insight appear normal ? ? ? ?Data Reviewed: ? ?Labs reviewed are notable for glucose 109, BUN 29, calcium 8.7, albumin 3.1, AST 100, ALT 149, total protein 6.4, total bili 4.9, triglycerides 220 ? ?Family Communication: None at bedside during my encounter today.  Sisters and granddaughter have been at bedside prior encounters. ? ?Disposition: ?Status is: Inpatient ?Remains inpatient appropriate because: Severity of illness with persistent duodenal obstruction unable to tolerate any p.o. intake, plans underway for addressing nutrition, further evaluation of newly diagnosed malignancy is underway ? ? Planned Discharge Destination: Home with Home Health ? ? ? ?Time spent: 35 minutes ? ?Author: ?Ezekiel Slocumb, DO ?09/03/2021 2:46 PM ? ?For on call review [email or web address].  ?

## 2021-09-03 NOTE — Progress Notes (Signed)
Patient called out stating she was bleeding. Patient stated she had been sleeping on her left side where her biliary drain is. Cleaned blood off patient, changed linen and removed saturated gauze dressing. Small amount of bleeding coming from insertion site and some of the sutures have come out. Applied pressure dressing and checked several times after to  see if patient was bleeding through the dressing/is not/has started to clot. Informed Hospitalist and told to apply pressure dressing and monitor. Contacted and spoke with on call Interventional Radiologist Dr. Dwaine Gale at [phone] who let me know they would see her in the morning. Will continue to monitor biliary drain for drainage and dressings for bleeding through/need for reinforcement. ?

## 2021-09-03 NOTE — Progress Notes (Addendum)
Initial Nutrition Assessment ?RD working remotely. ? ?DOCUMENTATION CODES:  ? ?Not applicable ? ?INTERVENTION:  ?- TPN initiation and management per Pharmacist. ?- diet advancement as medically feasible.  ?- complete NFPE when feasible. ?- weigh patient today. ?- will monitor for plan concerning J-tube placement and initiation of TF.  ? ?Monitor magnesium, potassium, and phosphorus BID for at least 3 days, MD to replete as needed, as pt is at risk for refeeding syndrome given no nutrition for at least 5 days. ? ?- recommend 100 mg IV thiamine/day x5 days be added to TPN d/t refeeding risk.  ? ? ?NUTRITION DIAGNOSIS:  ? ?Increased nutrient needs related to acute illness, catabolic illness as evidenced by estimated needs. ? ?GOAL:  ? ?Patient will meet greater than or equal to 90% of their needs ? ?MONITOR:  ? ?Diet advancement, Labs, Weight trends, I & O's, Other (Comment) (TPN regimen) ? ?REASON FOR ASSESSMENT:  ? ?Consult ?New TPN/TNA ? ?ASSESSMENT:  ? ?66 year-old female with medical history of anxiety, HTN, HLD, GERD, anemia, and gallstones. She presented to the ED due to jaundice and RUQ abdominal pain. On admission she had elevated bilirubin, LFTs, alk phos. 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 ? ?She has been NPO since admission on 3/21. NGT placed in L nare on 3/22 late morning (gastric per abdominal x-ray that day). NGT to LIS with 700 ml out overnight.  ? ?Page received on the on-call pager from Pharmacist earlier this morning to share plan for TPN initiation today. No cental access per review of LDA avatar; order in place for PICC placement.  ? ?She has not been seen by a [place] RD at any time in the past.   ? ?Weight on 3/20 was 141 lb and weight had been stable since 04/18/21.   ? ?GI and Oncology following. Recommendation for inpatient mammogram and L breast biopsy. Recommendation for J-tube.  ? ? ?Labs reviewed; CBGs: 128, 113, 130 mg/dl, BUN: 29 mg/dl, Ca: 8.7 mg/dl, LFTs elevated.  ? ?Medications reviewed; sliding scale novolog, 8 units semglee/day, 37.5 mcg IV synthroid/day, 10 mEq IV KCl x3 runs 3/24.  ? ?IVF; NS @ 100 ml/hr.  ?  ? ?NUTRITION - FOCUSED PHYSICAL EXAM: ? ?RD working remotely. ? ?Diet Order:   ?Diet Order   ? ?       ?  Diet NPO time specified Except for: Sips with Meds, Ice Chips  Diet effective midnight       ?  ? ?  ?  ? ?  ? ? ?EDUCATION NEEDS:  ? ?Not appropriate for education at this time ? ?Skin:  Skin Assessment: Reviewed RN Assessment ? ?Last BM:  unknown ? ?Height:  ? ?Ht Readings from Last 1 Encounters:  ?08/29/21 5' 3.5" (1.613 m)  ? ? ?Weight:  ? ?Wt Readings from Last 1 Encounters:  ?08/29/21 64 kg  ? ? ?BMI:  Body mass index is 24.59 kg/m?. ? ?Estimated Nutritional Needs:  ?Kcal:  2000-2200 kcal ?Protein:  100-110 grams ?Fluid:  >/= 2 L/day ? ? ? ? ? ?Jarome Matin, MS, RD, LDN ?Registered Dietitian II ?Inpatient Clinical Nutrition ?RD pager # and on-call/weekend pager # available in [place]  ? ?

## 2021-09-03 NOTE — Progress Notes (Signed)
Peripherally Inserted Central Catheter Placement ? ?The IV Nurse has discussed with the patient and/or persons authorized to consent for the patient, the purpose of this procedure and the potential benefits and risks involved with this procedure.  The benefits include less needle sticks, lab draws from the catheter, and the patient may be discharged home with the catheter. Risks include, but not limited to, infection, bleeding, blood clot (thrombus formation), and puncture of an artery; nerve damage and irregular heartbeat and possibility to perform a PICC exchange if needed/ordered by physician.  Alternatives to this procedure were also discussed.  Bard Power PICC patient education guide, fact sheet on infection prevention and patient information card has been provided to patient /or left at bedside.   ? ?PICC Placement Documentation  ?PICC Single Lumen 09/03/21 Right Brachial 37 cm 0 cm (Active)  ?Indication for Insertion or Continuance of Line Administration of hyperosmolar/irritating solutions (i.e. TPN, Vancomycin, etc.) 09/03/21 1910  ?Exposed Catheter (cm) 0 cm 09/03/21 1910  ?Site Assessment Clean, Dry, Intact 09/03/21 1910  ?Line Status Flushed;Saline locked;Blood return noted 09/03/21 1910  ?Dressing Type Securing device;Transparent 09/03/21 1910  ?Dressing Status Antimicrobial disc in place;Clean, Dry, Intact 09/03/21 1910  ?Safety Lock Not Applicable 23/30/07 6226  ?Line Care Connections checked and tightened 09/03/21 1910  ?Line Adjustment (NICU/IV Team Only) Yes 09/03/21 1910  ?Dressing Intervention New dressing 09/03/21 1910  ?Dressing Change Due 09/10/21 09/03/21 1910  ? ? ? ? ? ?Rolena Infante ?09/03/2021, 7:11 PM ? ?

## 2021-09-03 NOTE — Consult Note (Signed)
[facility] ?SURGICAL CONSULTATION NOTE (initial) - cpt: 62863 ? ? ?HISTORY OF PRESENT ILLNESS (HPI):  ?66 y.o. female presented to [facility] on March 20 for evaluation of jaundice and right upper quadrant abdominal pain.   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  Intra and extrahepatic ductal dilatation.  2.3 cm spiculated lesion in the left breast concerning for primary breast neoplasm.  ?Patient underwent upper endoscopy by Dr. Allen Norris which showed large amount of food residue in the stomach.  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  Differential diagnosis includes lobular breast carcinoma versus GI signet ring carcinoma.   CA 19-9 is elevated at 347.  Final pathology report and markers are pending. ?IR provided a biliary stent with external drainage, thus improving her jaundice. ?As I visit with her she has been tolerating 5 hours of nasogastric tube clamping without complaint of nausea or vomiting or abdominal pain. ?She had a laparoscopic cholecystectomy 6 years ago, but no other abdominal surgeries. ?Prior to admission she admits she has had diminished appetite subsequently decreasing to about 1 meal per day.  She reported complaining of postprandial burning and severe reflux. ?Surgery is consulted by hospitalist physician Dr. Arbutus Ped in this context for evaluation and management of gastric outlet obstruction, of likely malignant etiology, but yet to be defined definitive primary.   ?Dr. Janese Banks saw the patient last night and I agree we place a high priority on her nutritional status.  Orders for PICC have been placed, along with TPN. ? ?PAST MEDICAL HISTORY (PMH):  ?Past Medical  History:  ?Diagnosis Date  ? Anemia   ? Anxiety   ? Gallstones   ? GERD (gastroesophageal reflux disease)   ? Hyperlipidemia   ? Hypertension   ?  ? ?PAST SURGICAL HISTORY ([place]):  ?Past Surgical History:  ?Procedure Laterality Date  ? BILATERAL SALPINGECTOMY  04/03/1997  ? CHOLECYSTECTOMY N/A 07/07/2015  ? Procedure: LAPAROSCOPIC CHOLECYSTECTOMY ;  Surgeon: Jules Husbands, MD;  Location: [facility] ORS;  Service: General;  Laterality: N/A;  ? ESOPHAGOGASTRODUODENOSCOPY  08/30/2021  ? Procedure: ESOPHAGOGASTRODUODENOSCOPY (EGD);  Surgeon: Lucilla Lame, MD;  Location: [facility] ENDOSCOPY;  Service: Endoscopy;;  ? GALLBLADDER SURGERY  07/07/2015  ? [place]  ? IR BILIARY DRAIN PLACEMENT WITH CHOLANGIOGRAM  08/31/2021  ? IR CONVERT BILIARY DRAIN TO INT EXT BILIARY DRAIN  09/02/2021  ?  ? ?MEDICATIONS:  ?Prior to Admission medications   ?Medication Sig Start Date End Date Taking? Authorizing Provider  ?famotidine (PEPCID) 10 MG tablet Take 1 tablet (10 mg total) by mouth 2 (two) times daily. 08/04/21  Yes Mecum, Erin E, PA-C  ?hydrochlorothiazide (HYDRODIURIL) 25 MG tablet Take 1 tablet (25 mg total) by mouth daily. 10/12/20  Yes Bacigalupo, Dionne Bucy, MD  ?levothyroxine (SYNTHROID) 75 MCG tablet Take 1 tablet (75 mcg total) by mouth daily. 10/12/20  Yes Bacigalupo, Dionne Bucy, MD  ?  ? ?ALLERGIES:  ?Allergies  ?Allergen Reactions  ? Peanuts [Peanut Oil] Shortness Of Breath and Itching  ?  ? ?SOCIAL HISTORY:  ?Social History  ? ?Socioeconomic History  ? Marital status: Married  ?  Spouse name: Not on file  ? Number of children: Not on file  ? Years of education: Not on file  ?  Highest education level: Not on file  ?Occupational History  ? Not on file  ?Tobacco Use  ? Smoking status: Never  ? Smokeless tobacco: Never  ?Vaping Use  ? Vaping Use: Never used  ?Substance and Sexual Activity  ? Alcohol use: No  ? Drug use: No  ? Sexual activity: Yes  ?Other Topics Concern  ? Not on file  ?Social History Narrative  ? Not on file  ? ?Social Determinants of  Health  ? ?Financial Resource Strain: Not on file  ?Food Insecurity: Not on file  ?Transportation Needs: Not on file  ?Physical Activity: Not on file  ?Stress: Not on file  ?Social Connections: Not on file  ?Intimate Partner Violence: Not on file  ?  ? ?FAMILY HISTORY:  ?Family History  ?Problem Relation Age of Onset  ? Hypertension Mother   ? CVA Mother   ? Ulcers Mother   ? Heart disease Father   ? Hypertension Sister   ? Diabetes Sister   ? Hypertension Sister   ? Diabetes Sister   ?  ? ? ?REVIEW OF SYSTEMS:  ?Review of Systems  ?Constitutional:  Positive for malaise/fatigue and weight loss.  ?HENT:  Negative for hearing loss.   ?Eyes: Negative.   ?Respiratory: Negative.    ?Cardiovascular: Negative.   ?Gastrointestinal:  Positive for heartburn, nausea and vomiting. Negative for abdominal pain, blood in stool, constipation, diarrhea and melena.  ?Genitourinary:  Negative for dysuria.  ?Skin:  Positive for itching.  ?Neurological: Negative.   ?Psychiatric/Behavioral: Negative.    ? ?VITAL SIGNS:  ?Temp:  [97.8 ?F (36.6 ?C)-98.5 ?F (36.9 ?C)] 98.3 ?F (36.8 ?C) (03/25 1552) ?Pulse Rate:  [57-64] 63 (03/25 1552) ?Resp:  [16-18] 18 (03/25 1552) ?BP: (155-175)/(83-88) 175/87 (03/25 1552) ?SpO2:  [96 %-99 %] 97 % (03/25 1552) ?Weight:  [59.7 kg] 59.7 kg (03/25 1200)     Height: 5' 3.5" (161.3 cm) Weight: 59.7 kg BMI (Calculated): 22.95  ? ?INTAKE/OUTPUT:  ?03/24 0701 - 03/25 0700 ?In: 1395.2 [I.V.:1231.9; IV Piggyback:153.3] ?Out: 3000 [Emesis/NG output:2050; Drains:950] ? ?PHYSICAL EXAM:  ?Physical Exam ?Blood pressure (!) 175/87, pulse 63, temperature 98.3 ?F (36.8 ?C), resp. rate 18, height 5' 3.5" (1.613 m), weight 59.7 kg, SpO2 97 %. ?Last Weight  Most recent update: 09/03/2021 12:48 PM  ? ? Weight  ?59.7 kg (131 lb 9.8 oz)  ?      ? ?  ? ? ?CONSTITUTIONAL: Well developed, and nourished, appropriately responsive and aware without distress.   ?EYES: Sclera icteric.   ?EARS, NOSE, MOUTH AND THROAT:   The oropharynx is  clear. Oral mucosa is pink and moist.   Hearing is intact to voice.  ?NECK: Trachea is midline, and there is no jugular venous distension.  ?LYMPH NODES:  Lymph nodes in the neck are not noted. ?RESPIRATORY:  Lungs are clear, and breath sounds are equal bilaterally. Normal respiratory effort without pathologic use of accessory muscles. ?CARDIOVASCULAR: Heart is regular in rate and rhythm. ?GI: The abdomen is soft, nontender, and nondistended. There were no palpable masses. I did not appreciate hepatosplenomegaly. There were normal bowel sounds. ?GU: CNA present as chaperone.  There is a prominent inferior left breast mass with dermal dimpling, roughly 3 cm, mobile but taking up a significant portion of her lower mid breast.  I could not appreciate any left axillary adenopathy or supraclavicular lymphadenopathy. ?MUSCULOSKELETAL:  Symmetrical muscle tone appreciated in all four extremities.    ?SKIN: Skin turgor is normal. No pathologic skin lesions  appreciated.  ?NEUROLOGIC:  Motor and sensation appear grossly normal.  Cranial nerves are grossly without defect. ?PSYCH:  Alert and oriented to person, place and time. Affect is appropriate for situation. ? ?Data Reviewed ?I have personally reviewed what is currently available of the patient's imaging, recent labs and medical records.   ? ?Labs:  ? ?  Latest Ref Rng & Units 09/02/2021  ?  3:47 AM 09/01/2021  ?  4:11 AM 08/31/2021  ?  4:26 AM  ?CBC  ?WBC 4.0 - 10.5 K/uL 10.4   11.1   9.3    ?Hemoglobin 12.0 - 15.0 g/dL 14.3   13.8   13.4    ?Hematocrit 36.0 - 46.0 % 42.3   41.4   39.6    ?Platelets 150 - 400 K/uL 274   260   241    ? ? ?  Latest Ref Rng & Units 09/03/2021  ?  5:21 AM 09/02/2021  ?  3:47 AM 09/01/2021  ?  4:11 AM  ?CMP  ?Glucose 70 - 99 mg/dL 109   133   141    ?BUN 8 - 23 mg/dL '29   28   26    '$ ?Creatinine 0.44 - 1.00 mg/dL 0.77   0.81   0.74    ?Sodium 135 - 145 mmol/L 142   140   140    ?Potassium 3.5 - 5.1 mmol/L 3.7   3.4   3.9    ?Chloride 98 - 111 mmol/L  104   107   109    ?CO2 22 - 32 mmol/L '29   25   23    '$ ?Calcium 8.9 - 10.3 mg/dL 8.7   8.6   8.8    ?Total Protein 6.5 - 8.1 g/dL 6.4   6.7   6.1    ?Total Bilirubin 0.3 - 1.2 mg/dL 4.9   5.0   5.0

## 2021-09-03 NOTE — Consult Note (Signed)
PHARMACY - TOTAL PARENTERAL NUTRITION CONSULT NOTE  ? ?Indication: Small bowel obstruction ? ?Patient Measurements: ?Height: 5' 3.5" (161.3 cm) ?Weight: 64 kg (141 lb) ?IBW/kg (Calculated) : 53.55 ?TPN AdjBW (KG): 64 ?Body mass index is 24.59 kg/m?. ?Usual Weight: 64 kg ? ?Assessment:  ?66 years old female with PMH significant for diabetes, hypertension, hypothyroidism, prior cholecystectomy sent to [facility] ED 08/29/21 from PCP due to elevated liver enzymes. 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 Patient intolerant to NG tube with increased emesis ? ?Glucose / Insulin: BG 109-130; Semglee 8u + 0-15u Q4H ?Electrolytes: WNL ?Renal: BUN 29 ?Hepatic: AST 100, ALT 149, T Bili 4.9, Albumin 3.1, Protein 6.4 ?Intake / Output; MIVF: net - 470 mL, NS @ 100 mL/hr ?GI Imaging: ?3/20: Suspected masslike duodenal wall thickening extending to the ampulla, worrisome for duodenal/ampullary carcinoma ?GI Surgeries / Procedures:  ?3/21: Endoscopy ?3/22: external biliary drain placement  ? ? ?Central access: PICC line planned 09/03/21 ?TPN start date: 09/03/21; TPN day 1 ? ?Nutritional Goals: ?Goal TPN rate is 90 mL/hr (provides 104 g of protein and 2186 kcals per day) ? ?RD Assessment: ?Estimated Needs ?Total Energy Estimated Needs: 2000-2200 kcal ?Total Protein Estimated Needs: 100-110 grams ?Total Fluid Estimated Needs: >/= 2 L/day ? ?Current Nutrition:  ?NPO ? ?Plan:  ?09/03/21 TPN day 1 ?Start TPN at 45 mL/hr at 1800 ?Electrolytes in TPN: Na 51mq/L, K 577m/L, Ca 31m41mL, Mg 31mE63m, and Phos 131mm26m. Cl:Ac 1:1 ?Add standard MVI to TPN ?Reduce trace elements to 1/2 due to elevated t bili and jaundice ?Add thiamine 100 mg to TPN x 5 days  ?Continue glargine 8 u daily  ?Initiate Moderate q6h SSI and adjust as needed  ?Reduce MIVF to 45 mL/hr at 1800 ?Monitor TPN labs on Mon/Thurs, daily until stable ? ?CarisDorothe PearmD, BCPS ?Clinical Pharmacist    ?09/03/2021,10:50 AM ? ?

## 2021-09-03 NOTE — TOC Progression Note (Addendum)
Transition of Care (TOC) - Progression Note  ? ? ?Patient Details  ?Name: Diamond Hinton ?MRN: [id number] ?Date of Birth: [birth year] ? ?Transition of Care (TOC) CM/SW Contact  ?Xareni Kelch E Shakeera Rightmyer, LCSW ?Phone Number: ?09/03/2021, 12:32 PM ? ?Clinical Narrative:   Medical workup still pending for plan.  ?CSW asked TOC RNCMs as well as [place] agencies (that accept [employer]) if going home with NG with suction is an option if needed per DO request.  ?Per Malachy Mood with [facility], they can provide [facility] for a patient at home with NG to suction as long as there is a teachable caregiver in the home.  ? ?CSW spoke with patient who states she lives with her husband. She stated her husband has Parkinsons but is fairly independent and can help her at home. He will be able to provide 24 hour supervision to her at home. Patient stated she has a sister who lives 5 minutes away and can help as needed, and has 2 daughters who work during the day but could assist after work as needed.  ?Patient confirmed her PCP is Dr. Brita Romp and pharmacy is [facility]. No HH or DME history but patient is agreeable to [facility] services at time of DC. Confirmed home address.  ? ?Malachy Mood with [facility] has accepted referral and will follow for medical plan at hospital and for DC. Per Malachy Mood, if patient does need to go home with an NG to suction, some teaching will need to be done at bedside with caregiver and then [facility] will also do teaching at home.  ? ?TOC will need to order suction if this ends up being the confirmed plan.  ? ? ? ?  ?  ? ?Expected Discharge Plan and Services ?  ?  ?  ?  ?  ?                ?  ?  ?  ?  ?  ?  ?  ?  ?  ?  ? ? ?Social Determinants of Health (SDOH) Interventions ?  ? ?Readmission Risk Interventions ?   ? View : No data to display.  ?  ?  ?  ? ? ?

## 2021-09-04 ENCOUNTER — Inpatient Hospital Stay: Payer: Medicare HMO

## 2021-09-04 DIAGNOSIS — K311 Adult hypertrophic pyloric stenosis: Secondary | ICD-10-CM | POA: Diagnosis not present

## 2021-09-04 DIAGNOSIS — K831 Obstruction of bile duct: Secondary | ICD-10-CM | POA: Diagnosis not present

## 2021-09-04 DIAGNOSIS — K5900 Constipation, unspecified: Secondary | ICD-10-CM | POA: Diagnosis present

## 2021-09-04 LAB — PHOSPHORUS: Phosphorus: 2.4 mg/dL — ABNORMAL LOW (ref 2.5–4.6)

## 2021-09-04 LAB — COMPREHENSIVE METABOLIC PANEL
ALT: 122 U/L — ABNORMAL HIGH (ref 0–44)
AST: 73 U/L — ABNORMAL HIGH (ref 15–41)
Albumin: 3 g/dL — ABNORMAL LOW (ref 3.5–5.0)
Alkaline Phosphatase: 99 U/L (ref 38–126)
Anion gap: 6 (ref 5–15)
BUN: 30 mg/dL — ABNORMAL HIGH (ref 8–23)
CO2: 28 mmol/L (ref 22–32)
Calcium: 8.4 mg/dL — ABNORMAL LOW (ref 8.9–10.3)
Chloride: 107 mmol/L (ref 98–111)
Creatinine, Ser: 0.64 mg/dL (ref 0.44–1.00)
GFR, Estimated: >60 mL/min (ref >60)
Glucose, Bld: 221 mg/dL — ABNORMAL HIGH (ref 70–99)
Potassium: 3.7 mmol/L (ref 3.5–5.1)
Sodium: 141 mmol/L (ref 135–145)
Total Bilirubin: 3.9 mg/dL — ABNORMAL HIGH (ref 0.3–1.2)
Total Protein: 6.2 g/dL — ABNORMAL LOW (ref 6.5–8.1)

## 2021-09-04 LAB — CANCER ANTIGEN 15-3: CA 15-3: 43.3 U/mL — ABNORMAL HIGH (ref 0.0–25.0)

## 2021-09-04 LAB — GLUCOSE, CAPILLARY
Glucose-Capillary: 178 mg/dL — ABNORMAL HIGH (ref 70–99)
Glucose-Capillary: 179 mg/dL — ABNORMAL HIGH (ref 70–99)
Glucose-Capillary: 213 mg/dL — ABNORMAL HIGH (ref 70–99)
Glucose-Capillary: 227 mg/dL — ABNORMAL HIGH (ref 70–99)

## 2021-09-04 LAB — CANCER ANTIGEN 27.29: CA 27.29: 48.1 U/mL — ABNORMAL HIGH (ref 0.0–38.6)

## 2021-09-04 LAB — MAGNESIUM: Magnesium: 2.5 mg/dL — ABNORMAL HIGH (ref 1.7–2.4)

## 2021-09-04 LAB — CEA: CEA: 3.3 ng/mL (ref 0.0–4.7)

## 2021-09-04 LAB — TRIGLYCERIDES: Triglycerides: 180 mg/dL — ABNORMAL HIGH (ref <150)

## 2021-09-04 MED ORDER — TRAVASOL 10 % IV SOLN
INTRAVENOUS | Status: AC
  Filled 2021-09-04: qty 1036.8

## 2021-09-04 MED ORDER — BISACODYL 10 MG RE SUPP: 10.0000 mg | Freq: Every day | RECTAL | Status: DC | PRN

## 2021-09-04 MED ORDER — METOPROLOL TARTRATE 5 MG/5ML IV SOLN
5.0000 mg | Freq: Four times a day (QID) | INTRAVENOUS | Status: DC | PRN
  Administered 2021-09-05: 5 mg via INTRAVENOUS
  Filled 2021-09-04: qty 5

## 2021-09-04 MED ORDER — POLYETHYLENE GLYCOL 3350 17 G PO PACK
17.0000 g | PACK | Freq: Every day | ORAL | Status: DC
  Administered 2021-09-04: 17 g via ORAL
  Filled 2021-09-04 (×4): qty 1

## 2021-09-04 NOTE — Consult Note (Signed)
PHARMACY - TOTAL PARENTERAL NUTRITION CONSULT NOTE  ? ?Indication: Small bowel obstruction ? ?Patient Measurements: ?Height: 5' 3.5" (161.3 cm) ?Weight: 59.7 kg (131 lb 9.8 oz) ?IBW/kg (Calculated) : 53.55 ?TPN AdjBW (KG): 64 ?Body mass index is 22.95 kg/m?. ?Usual Weight: 64 kg ? ?Assessment:  ?66 years old female with PMH significant for diabetes, hypertension, hypothyroidism, prior cholecystectomy sent to [facility] ED 08/29/21 from PCP due to elevated liver enzymes. 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 Patient intolerant to NG tube with increased emesis ? ?Glucose / Insulin: BG 109-130; Semglee 8u + 0-15u Q4H ?Electrolytes: Mag 2.5, Phos 2.4 ?Renal: BUN 29 ?Hepatic: AST 73, ALT 122, T Bili 3.9, Albumin 3.0, Protein 6.2 ?Intake / Output; MIVF: net - 470 mL, NS @ 100 mL/hr ?GI Imaging: ?3/20: Suspected masslike duodenal wall thickening extending to the ampulla, worrisome for duodenal/ampullary carcinoma ?GI Surgeries / Procedures:  ?3/21: Endoscopy ?3/22: external biliary drain placement  ? ? ?Central access: PICC line planned 09/03/21 ?TPN start date: 09/03/21; TPN day 2 ? ?Nutritional Goals: ?Goal TPN rate is 90 mL/hr (provides 104 g of protein and 2186 kcals per day) ? ?RD Assessment: ?Estimated Needs ?Total Energy Estimated Needs: 2000-2200 kcal ?Total Protein Estimated Needs: 100-110 grams ?Total Fluid Estimated Needs: >/= 2 L/day ? ?Current Nutrition:  ?NPO ? ?Plan:  ?09/03/21 TPN day 1 ?Will increase TPN to goal rate of 45 mL/hr at 1800 ?Electrolytes in TPN: Na 715mq/L, K 532m/L, Ca 15m43mL, Mg 0mE68m, and Phos 20mm23m. Cl:Ac 1:1 ?Add standard MVI to TPN ?Reduce trace elements to 1/2 due to elevated t bili and jaundice ?Add thiamine 100 mg to TPN x 5 days  ?Continue glargine 8 u daily  ?Initiate Moderate q6h SSI and adjust as needed  ?Will discontinue MIVF this evening at 1800 once new TPN bag is hung ?Monitor TPN labs on  Mon/Thurs, daily until stable ? ?WalidPearla DubonnetrmD ?Clinical Pharmacist ?09/04/2021 ?11:33 AM ? ? ?

## 2021-09-04 NOTE — Assessment & Plan Note (Addendum)
Continue MiraLAX.  Dulcolax suppository if needed or not tolerating liquids for MiraLAX. ?

## 2021-09-04 NOTE — Progress Notes (Signed)
Patient's dressing still clean/dry/intact, no drainage observed. Biliary drain still draining/patient denies any pain. ?

## 2021-09-04 NOTE — Progress Notes (Signed)
?Progress Note ? ? ?Patient: Diamond Hinton KDT:[id number] DOB: 18-Oct-1955 DOA: 08/29/2021     6 ?DOS: the patient was seen and examined on 09/04/2021 ?  ?Brief hospital course: ?This 66 years old female with PMH significant for diabetes, hypertension, hypothyroidism, prior cholecystectomy sent by her PCP with abnormal labs notably elevated liver enzymes.  Patient reports having gastric reflux symptoms for about a month and has been receiving treatment with famotidine, sucralfate but her symptoms had not improved.  Work-up in the ED shows elevated AST ALT and total bilirubin, lipase 395, UA: Large LE.  CT abdomen and pelvis shows marked distention of the stomach finding suggestive of possible benign or malignant stricture.  Endoscopic evaluation recommended.  Left breast mass with slightly spiculated margins consider outpatient mammogram.   ? ?GI was consulted.  They attempted ERCP on 3/21, but unable to proceed due to food and liquid residue in the stomach.  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   ? ?IR consulted for biliary drain placement on 3/22, but unable to advance drain past sphincter of Oddi. ?Returned to IR on 3/24 for advancement of drain.   ? ?Preliminary biopsy results showing this is likely metastatic breast cancer.  Surgery consulted for consideration of a J tube for nutrition.  Oncology also consulted. ? ?Assessment and Plan: ?* Obstructive jaundice ?Presented with jaundice, with history of prior cholecystectomy, with elevated LFTs, lipase. ?GI was consulted and recommended ERCP. ?ERCP was attempted on 3/21 but unable to be performed due to food and liquid residue within the stomach.  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 ?--Further evaluation as outlined. ?--Continue pain control as needed. ?--Continue IV Zofran for nausea and vomiting. ?--Continue IV hydration. ? ?Elevated lipase ?No focal abnormality seen in the pancreas on CT.   Suspect related to obstructive process. ?-- Biliary drain placed by IR on 3/22 ? ?Gastric outlet obstruction ?CT  A/P: Shows gastric distention with possible pyloric stenosis. ?Severe stricture in the first portion of the duodenum noted during attempted ERCP on 3/21, biopsied. ? ?MRCP on 3/21  ?IMPRESSION: ?--Suspected masslike duodenal wall thickening extending to the ?ampulla, worrisome for duodenal/ampullary carcinoma. Consider ?endoscopy/EUS for further evaluation. ?--Associated intrahepatic/extrahepatic ductal dilatation. Common duct ?measures 18 mm. No choledocholithiasis is seen. ?--Associated prominent pancreatic duct, extending to the ampulla, ?measuring 6 mm. ?--Associated duodenal narrowing/stricture with suspected functional ?gastric outlet obstruction. ?--2.3 cm irregular/spiculated lesion in the left inferior breast, ?suspicious for primary breast neoplasm. Mammographic correlation is ?suggested. ? ?Continue NPO.  ?NG tube clamped yesterday - well tolerated. ?PICC line placed 3/25 and TPN started. ?Biliary drain placed by IR 3/22. ? ?General surgery consulted - plan for Gastrografin study, after which may be able to start clear liquid diet. ? ?Aspiration precautions. ?Management as above ?Continue IV Protonix IV ?Follow-up biopsy pathology ? ? ? ?Metastatic breast cancer ([place]) ?New diagnosis this admission.  Presented with biliary obstructive symptoms and noted to have spiculated mass in left breast on imaging upon admission.  Now appears this is metastatic breast malignancy, based on preliminary pathology results GI's endoscopic biopsy obtained during attempted ERCP. ?--Oncology consulted - see Dr. Elroy Channel recommendations ?--Hormone receptors added to path orders ?-- Goals of care discussions underway ? ?--L breast biopsy pending ?--NM bone scan pending ?--Hormone receptor's pending along with final pathology from duodenal biopsy. ? ? ?Uncontrolled type 2 diabetes mellitus with hyperglycemia, without  long-term current use of insulin ([place]) ?Sliding scale  insulin coverage ? ?History of cholecystectomy ?Noted history in the setting of current suspected obstructive jaundice ? ?UTI (urinary tract infection) ?Patient treated with Rocephin in the ED. ?She denies any urinary symptoms. ?Monitor for symptoms. ?On Zosyn with biliary drain. ? ?Abnormal LFTs ?Hepatic steatosis on CT with dilatation of intra and extra biliary ducts.   ?Concern for malignant process, as outlined. ?--Follow CMP ? ?Constipation ?MiraLAX if able to start on clear liquids or while NG tube clamped.  Dulcolax suppository if unable to give oral meds ? ?Stenosis of duodenum ?Pathology results pending.   ?Biliary drain placed by IR 3/22. ?Follow-up surgery's recommendations for options to relieve the obstruction. ?Meantime requires NG tube to suction. ?Trial of NGT clamped today. ? ?Hypokalemia ?IV repletion while n.p.o. and monitor ? ?3/24: K 3.4, will give K-riders IV 10 mEqx3 replacement. ? ?Hypertension ?Continue IV hydralazine as needed while n.p.o. ? ?Adult hypothyroidism ?On IV levothyroxine while n.p.o. ? ? ? ? ?  ? ?Subjective: Patient seen this morning, awake sitting up in bed.  Reports tolerating clamping of her NG tube yesterday well.  Surgery had been in this morning, wanted to place back on suction to see how much output is obtained.  She is hopeful to be able to tolerate a clear liquid diet.  She denies other acute complaints at this time. ? ?Physical Exam: ?Vitals:  ? 09/03/21 1552 09/03/21 1957 09/04/21 0430 09/04/21 0803  ?BP: (!) 175/87 (!) 160/80 (!) 141/94 (!) 151/80  ?Pulse: 63 (!) 57 68 62  ?Resp: '18 16 17 18  '$ ?Temp: 98.3 ?F (36.8 ?C) 98 ?F (36.7 ?C) 97.8 ?F (36.6 ?C) 98.3 ?F (36.8 ?C)  ?TempSrc:    Oral  ?SpO2: 97% 95% 99% 98%  ?Weight:      ?Height:      ? ?General exam: awake, alert, no acute distress ?HEENT: NG tube in place to wall suction, moist mucus membranes, hearing grossly normal  ?Respiratory system: normal  respiratory effort, on room air. ?Cardiovascular system: RRR, no pedal edema.   ?Gastrointestinal system: Percutaneous biliary drain in place, soft, nondistended abdomen. ?Central nervous system: A&O x4. no gross focal neurologic deficits, normal speech ?Extremities: moves all, no edema, normal tone ?Skin: dry, intact, normal temperature ?Psychiatry: normal mood, congruent affect, judgement and insight appear normal ? ? ?Data Reviewed: ? ?Labs reviewed notable for glucose 221, BUN 30, calcium 8.4 with albumin 3.0, phosphorus 2.4, magnesium 2.5, AST improving 73, ALT improving 122, total bili improving 3.9 triglycerides 180 ? ?Abdominal x-ray impression: ?Percutaneous internal/external biliary drain positioning is not ?significantly changed from spot fluoroscopic images performed at the ?end the procedure on March 24, with the tip in the horizontal ?duodenum. Constipation. ? ? ?Family Communication: None at bedside on rounds today, sisters and granddaughter have been present on previous days.  Will attempt to call ? ?Disposition: ?Status is: Inpatient ?Remains inpatient appropriate because: Severity of illness with ongoing evaluation not appropriate for outpatient setting as outlined above ? ? Planned Discharge Destination: Home with Home Health ? ? ? ?Time spent: 35 minutes ? ?Author: ?Ezekiel Slocumb, DO ?09/04/2021 2:47 PM ? ?For on call review [email or web address].  ?

## 2021-09-04 NOTE — Progress Notes (Signed)
She currently has TPN running, with a PICC line in place. ? ?She has tolerated her nasogastric tube clamping over 24 hours now.  She denies any remarkable nausea though some was treated medically.  She has had no emesis, she is passing flatus.  She denies abdominal pain. ? ?She is bright and alert.  We resumed her nasogastric tube to suction to assess for any residual.  There was less than 100 mL on initial resumption of suction. ? ?I believe a Gastrografin upper GI evaluation to assess for emptying would be worthwhile.  She appears highly motivated to remain on a strict liquid diet, and we could have additional confidence when we see the degree of gastric emptying on a contrasted study. ? ? ?

## 2021-09-05 ENCOUNTER — Other Ambulatory Visit: Payer: Self-pay | Admitting: *Deleted

## 2021-09-05 ENCOUNTER — Inpatient Hospital Stay: Payer: Medicare HMO

## 2021-09-05 ENCOUNTER — Telehealth: Payer: Self-pay | Admitting: Pharmacy Technician

## 2021-09-05 ENCOUNTER — Other Ambulatory Visit (HOSPITAL_COMMUNITY): Payer: Self-pay

## 2021-09-05 DIAGNOSIS — Z7189 Other specified counseling: Secondary | ICD-10-CM

## 2021-09-05 DIAGNOSIS — C50919 Malignant neoplasm of unspecified site of unspecified female breast: Secondary | ICD-10-CM

## 2021-09-05 DIAGNOSIS — K311 Adult hypertrophic pyloric stenosis: Secondary | ICD-10-CM | POA: Diagnosis not present

## 2021-09-05 DIAGNOSIS — K831 Obstruction of bile duct: Secondary | ICD-10-CM | POA: Diagnosis not present

## 2021-09-05 DIAGNOSIS — N6320 Unspecified lump in the left breast, unspecified quadrant: Secondary | ICD-10-CM

## 2021-09-05 LAB — COMPREHENSIVE METABOLIC PANEL
ALT: 103 U/L — ABNORMAL HIGH (ref 0–44)
AST: 54 U/L — ABNORMAL HIGH (ref 15–41)
Albumin: 2.9 g/dL — ABNORMAL LOW (ref 3.5–5.0)
Alkaline Phosphatase: 94 U/L (ref 38–126)
Anion gap: 9 (ref 5–15)
BUN: 22 mg/dL (ref 8–23)
CO2: 23 mmol/L (ref 22–32)
Calcium: 8.6 mg/dL — ABNORMAL LOW (ref 8.9–10.3)
Chloride: 103 mmol/L (ref 98–111)
Creatinine, Ser: 0.55 mg/dL (ref 0.44–1.00)
GFR, Estimated: >60 mL/min (ref >60)
Glucose, Bld: 241 mg/dL — ABNORMAL HIGH (ref 70–99)
Potassium: 3.7 mmol/L (ref 3.5–5.1)
Sodium: 135 mmol/L (ref 135–145)
Total Bilirubin: 3.2 mg/dL — ABNORMAL HIGH (ref 0.3–1.2)
Total Protein: 6.3 g/dL — ABNORMAL LOW (ref 6.5–8.1)

## 2021-09-05 LAB — AEROBIC/ANAEROBIC CULTURE W GRAM STAIN (SURGICAL/DEEP WOUND)
Culture: NO GROWTH
Gram Stain: NONE SEEN

## 2021-09-05 LAB — GLUCOSE, CAPILLARY
Glucose-Capillary: 212 mg/dL — ABNORMAL HIGH (ref 70–99)
Glucose-Capillary: 225 mg/dL — ABNORMAL HIGH (ref 70–99)
Glucose-Capillary: 251 mg/dL — ABNORMAL HIGH (ref 70–99)
Glucose-Capillary: 266 mg/dL — ABNORMAL HIGH (ref 70–99)

## 2021-09-05 LAB — PHOSPHORUS: Phosphorus: 2.1 mg/dL — ABNORMAL LOW (ref 2.5–4.6)

## 2021-09-05 LAB — SURGICAL PATHOLOGY

## 2021-09-05 LAB — TRIGLYCERIDES: Triglycerides: 159 mg/dL — ABNORMAL HIGH (ref <150)

## 2021-09-05 LAB — MAGNESIUM: Magnesium: 2.2 mg/dL (ref 1.7–2.4)

## 2021-09-05 MED ORDER — ENSURE ENLIVE PO LIQD
237.0000 mL | Freq: Two times a day (BID) | ORAL | Status: DC
  Administered 2021-09-06 – 2021-09-07 (×4): 237 mL via ORAL

## 2021-09-05 MED ORDER — BOOST / RESOURCE BREEZE PO LIQD CUSTOM
1.0000 | Freq: Three times a day (TID) | ORAL | Status: DC
  Administered 2021-09-05: 1 via ORAL

## 2021-09-05 MED ORDER — HYDRALAZINE HCL 20 MG/ML IJ SOLN
10.0000 mg | Freq: Four times a day (QID) | INTRAMUSCULAR | Status: DC | PRN
  Administered 2021-09-05: 10 mg via INTRAVENOUS
  Filled 2021-09-05: qty 1

## 2021-09-05 MED ORDER — POTASSIUM PHOSPHATES 15 MMOLE/5ML IV SOLN
10.0000 mmol | Freq: Once | INTRAVENOUS | Status: DC
  Filled 2021-09-05: qty 3.33

## 2021-09-05 MED ORDER — DIATRIZOATE MEGLUMINE & SODIUM 66-10 % PO SOLN
120.0000 mL | Freq: Once | ORAL | Status: AC
  Administered 2021-09-05: 110 mL

## 2021-09-05 MED ORDER — TECHNETIUM TC 99M MEDRONATE IV KIT
20.0000 | PACK | Freq: Once | INTRAVENOUS | Status: AC | PRN
  Administered 2021-09-05: 20.97 via INTRAVENOUS

## 2021-09-05 MED ORDER — TRAVASOL 10 % IV SOLN
INTRAVENOUS | Status: AC
  Filled 2021-09-05: qty 1036.8

## 2021-09-05 MED ORDER — INSULIN GLARGINE-YFGN 100 UNIT/ML ~~LOC~~ SOLN
10.0000 [IU] | Freq: Every day | SUBCUTANEOUS | Status: DC
  Administered 2021-09-05: 10 [IU] via SUBCUTANEOUS
  Filled 2021-09-05 (×2): qty 0.1

## 2021-09-05 NOTE — Assessment & Plan Note (Addendum)
Phos 2.1 on 3/27, given K-phos IV  ?Monitor and replace as needed. ?

## 2021-09-05 NOTE — Progress Notes (Addendum)
? ? ? ?Hematology/Oncology Consult note ?[place]  ?[phone] [phone] ? ?Patient Care Team: ?Virginia Crews, MD as PCP - General (Family Medicine)  ? ?Name of the patient: Diamond Hinton  ?[id number]  ?[birth year]  ? ?Date of visit: 09/07/21 ? ? ? ?Interval history-resting comfortably in her bed.  NG tube is out.  She is passing gas and moving her bowels she is on clear liquid diet.Family at her bedside ? ?ECOG PS- 2 ?Pain scale- 0 ? ? ?Review of systems- Review of Systems  ?Constitutional:  Positive for malaise/fatigue. Negative for chills, fever and weight loss.  ?HENT:  Negative for congestion, ear discharge and nosebleeds.   ?Eyes:  Negative for blurred vision.  ?Respiratory:  Negative for cough, hemoptysis, sputum production, shortness of breath and wheezing.   ?Cardiovascular:  Negative for chest pain, palpitations, orthopnea and claudication.  ?Gastrointestinal:  Negative for abdominal pain, blood in stool, constipation, diarrhea, heartburn, melena, nausea and vomiting.  ?Genitourinary:  Negative for dysuria, flank pain, frequency, hematuria and urgency.  ?Musculoskeletal:  Negative for back pain, joint pain and myalgias.  ?Skin:  Negative for rash.  ?Neurological:  Negative for dizziness, tingling, focal weakness, seizures, weakness and headaches.  ?Endo/Heme/Allergies:  Does not bruise/bleed easily.  ?Psychiatric/Behavioral:  Negative for depression and suicidal ideas. The patient does not have insomnia.    ? ? ?Allergies  ?Allergen Reactions  ? Peanuts [Peanut Oil] Shortness Of Breath and Itching  ? ? ? ?Past Medical History:  ?Diagnosis Date  ? Anemia   ? Anxiety   ? Gallstones   ? GERD (gastroesophageal reflux disease)   ? Hyperlipidemia   ? Hypertension   ? ? ? ?Past Surgical History:  ?Procedure Laterality Date  ? BILATERAL SALPINGECTOMY  04/03/1997  ? CHOLECYSTECTOMY N/A 07/07/2015  ? Procedure: LAPAROSCOPIC CHOLECYSTECTOMY ;  Surgeon: Jules Husbands,  MD;  Location: [facility] ORS;  Service: General;  Laterality: N/A;  ? ESOPHAGOGASTRODUODENOSCOPY  08/30/2021  ? Procedure: ESOPHAGOGASTRODUODENOSCOPY (EGD);  Surgeon: Lucilla Lame, MD;  Location: [facility] ENDOSCOPY;  Service: Endoscopy;;  ? GALLBLADDER SURGERY  07/07/2015  ? [place]  ? IR BILIARY DRAIN PLACEMENT WITH CHOLANGIOGRAM  08/31/2021  ? IR CONVERT BILIARY DRAIN TO INT EXT BILIARY DRAIN  09/02/2021  ? ? ?Social History  ? ?Socioeconomic History  ? Marital status: Married  ?  Spouse name: Not on file  ? Number of children: Not on file  ? Years of education: Not on file  ? Highest education level: Not on file  ?Occupational History  ? Not on file  ?Tobacco Use  ? Smoking status: Never  ? Smokeless tobacco: Never  ?Vaping Use  ? Vaping Use: Never used  ?Substance and Sexual Activity  ? Alcohol use: No  ? Drug use: No  ? Sexual activity: Yes  ?Other Topics Concern  ? Not on file  ?Social History Narrative  ? Not on file  ? ?Social Determinants of Health  ? ?Financial Resource Strain: Not on file  ?Food Insecurity: Not on file  ?Transportation Needs: Not on file  ?Physical Activity: Not on file  ?Stress: Not on file  ?Social Connections: Not on file  ?Intimate Partner Violence: Not on file  ? ? ?Family History  ?Problem Relation Age of Onset  ? Hypertension Mother   ? CVA Mother   ? Ulcers Mother   ? Heart disease Father   ? Hypertension Sister   ? Diabetes Sister   ? Hypertension Sister   ?  Diabetes Sister   ? ? ? ?Current Facility-Administered Medications:  ?  0.9 %  sodium chloride infusion, , Intravenous, PRN, Ezekiel Slocumb, DO, Last Rate: 10 mL/hr at 09/05/21 0256, Infusion Verify at 09/05/21 0256 ?  acetaminophen (TYLENOL) tablet 650 mg, 650 mg, Oral, Q6H PRN **OR** acetaminophen (TYLENOL) suppository 650 mg, 650 mg, Rectal, Q6H PRN, Athena Masse, MD ?  bisacodyl (DULCOLAX) suppository 10 mg, 10 mg, Rectal, Daily PRN, Nicole Kindred A, DO ?  Chlorhexidine Gluconate Cloth 2 % PADS 6 each, 6 each, Topical, Daily,  Ezekiel Slocumb, DO, 6 each at 09/05/21 1302 ?  feeding supplement (BOOST / RESOURCE BREEZE) liquid 1 Container, 1 Container, Oral, TID BM, Ronny Bacon, MD ?  insulin aspart (novoLOG) injection 0-15 Units, 0-15 Units, Subcutaneous, Q6H, Dorothe Pea, RPH, 5 Units at 09/05/21 1134 ?  insulin glargine-yfgn (SEMGLEE) injection 10 Units, 10 Units, Subcutaneous, Daily, Rauer, Forde Dandy, RPH, 10 Units at 09/05/21 1303 ?  iohexol (OMNIPAQUE) 350 MG/ML injection 50 mL, 50 mL, Per Tube, Once PRN, Greggory Keen, MD ?  levothyroxine (SYNTHROID, LEVOTHROID) injection 37.5 mcg, 37.5 mcg, Intravenous, Daily, Dallie Piles, RPH, 37.5 mcg at 09/05/21 1134 ?  menthol-cetylpyridinium (CEPACOL) lozenge 3 mg, 1 lozenge, Oral, PRN, Nicole Kindred A, DO ?  metoprolol tartrate (LOPRESSOR) injection 5 mg, 5 mg, Intravenous, Q6H PRN, Nicole Kindred A, DO ?  morphine (PF) 2 MG/ML injection 2 mg, 2 mg, Intravenous, Q2H PRN, Athena Masse, MD, 2 mg at 09/02/21 2204 ?  ondansetron (ZOFRAN) tablet 4 mg, 4 mg, Oral, Q6H PRN **OR** ondansetron (ZOFRAN) injection 4 mg, 4 mg, Intravenous, Q6H PRN, Athena Masse, MD, 4 mg at 09/05/21 0756 ?  piperacillin-tazobactam (ZOSYN) IVPB 3.375 g, 3.375 g, Intravenous, Q8H, Annamaria Helling, DO, Last Rate: 12.5 mL/hr at 09/05/21 1310, 3.375 g at 09/05/21 1310 ?  polyethylene glycol (MIRALAX / GLYCOLAX) packet 17 g, 17 g, Oral, Daily, Nicole Kindred A, DO, 17 g at 09/04/21 1741 ?  sodium chloride flush (NS) 0.9 % injection 10-40 mL, 10-40 mL, Intracatheter, Q12H, Nicole Kindred A, DO, 10 mL at 09/05/21 1135 ?  sodium chloride flush (NS) 0.9 % injection 10-40 mL, 10-40 mL, Intracatheter, PRN, Nicole Kindred A, DO ?  sodium chloride flush (NS) 0.9 % injection 5 mL, 5 mL, Intracatheter, Q8H, Greggory Keen, MD, 5 mL at 09/05/21 1311 ?  TPN ADULT (ION), , Intravenous, Continuous TPN, Pearla Dubonnet, RPH, Last Rate: 90 mL/hr at 09/05/21 0256, Infusion Verify at 09/05/21 0256 ?  TPN ADULT  (ION), , Intravenous, Continuous TPN, Rauer, Forde Dandy, RPH ? ?Physical exam:  ?Vitals:  ? 09/04/21 2016 09/05/21 0606 09/05/21 0745 09/05/21 1619  ?BP: (!) 162/77 (!) 162/92 (!) 145/82 (!) 171/86  ?Pulse: 64 79 75 75  ?Resp: '17 17 18 18  '$ ?Temp: 98.3 ?F (36.8 ?C) 98.6 ?F (37 ?C) 98.1 ?F (36.7 ?C) 98.1 ?F (36.7 ?C)  ?TempSrc:   Oral Oral  ?SpO2: 97% 96% 98% 99%  ?Weight:      ?Height:      ? ?Physical Exam ?Constitutional:   ?   General: She is not in acute distress. ?Cardiovascular:  ?   Rate and Rhythm: Normal rate and regular rhythm.  ?   Heart sounds: Normal heart sounds.  ?Pulmonary:  ?   Effort: Pulmonary effort is normal.  ?   Breath sounds: Normal breath sounds.  ?Abdominal:  ?   General: Bowel sounds are normal. There is no distension.  ?  Palpations: Abdomen is soft.  ?   Comments: PTCA in place  ?Skin: ?   General: Skin is warm and dry.  ?Neurological:  ?   Mental Status: She is alert and oriented to person, place, and time.  ?  ? ? ?  Latest Ref Rng & Units 09/05/2021  ?  6:35 AM  ?CMP  ?Glucose 70 - 99 mg/dL 241    ?BUN 8 - 23 mg/dL 22    ?Creatinine 0.44 - 1.00 mg/dL 0.55    ?Sodium 135 - 145 mmol/L 135    ?Potassium 3.5 - 5.1 mmol/L 3.7    ?Chloride 98 - 111 mmol/L 103    ?CO2 22 - 32 mmol/L 23    ?Calcium 8.9 - 10.3 mg/dL 8.6    ?Total Protein 6.5 - 8.1 g/dL 6.3    ?Total Bilirubin 0.3 - 1.2 mg/dL 3.2    ?Alkaline Phos 38 - 126 U/L 94    ?AST 15 - 41 U/L 54    ?ALT 0 - 44 U/L 103    ? ? ?  Latest Ref Rng & Units 09/02/2021  ?  3:47 AM  ?CBC  ?WBC 4.0 - 10.5 K/uL 10.4    ?Hemoglobin 12.0 - 15.0 g/dL 14.3    ?Hematocrit 36.0 - 46.0 % 42.3    ?Platelets 150 - 400 K/uL 274    ? ? ?'@IMAGES'$ @ ? ?DG Abd 1 View ? ?Result Date: 09/04/2021 ?CLINICAL DATA:  Possible retraction of biliary drain. EXAM: ABDOMEN - 1 VIEW COMPARISON:  Post biliary drainage placement spot fluoroscopic images from 09/02/2021, flat plate upper abdominal image 08/31/21. FINDINGS: NGT terminates at the body of stomach proximal portion and is  well below the diaphragm. Percutaneous internal/external biliary drain has a similar configuration and appearance to the images following insertion on March 24, with tip in the horizontal duodenum. The

## 2021-09-05 NOTE — Consult Note (Signed)
PHARMACY - TOTAL PARENTERAL NUTRITION CONSULT NOTE  ? ?Indication: Small bowel obstruction ? ?Patient Measurements: ?Height: 5' 3.5" (161.3 cm) ?Weight: 59.7 kg (131 lb 9.8 oz) ?IBW/kg (Calculated) : 53.55 ?TPN AdjBW (KG): 64 ?Body mass index is 22.95 kg/m?. ?Usual Weight: 64 kg ? ?Assessment:  ?66 years old female with PMH significant for diabetes, hypertension, hypothyroidism, prior cholecystectomy sent to [facility] ED 08/29/21 from PCP due to elevated liver enzymes. 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 Patient intolerant to NG tube with increased emesis ? ?Glucose / Insulin: BG 178-212; Semglee 8u + 0-15u Q6H ?Electrolytes: Phos 2.1 ?Renal: BUN 29 ?Hepatic: AST 73, ALT 122, T Bili 3.9, Albumin 3.0, Protein 6.2 ?Intake / Output; MIVF: +961 mL ?GI Imaging: ?3/20: Suspected masslike duodenal wall thickening extending to the ampulla, worrisome for duodenal/ampullary carcinoma ? ?3/27: Percutaneous internal/external biliary drain  positioning is not significantly changed  ?GI Surgeries / Procedures:  ?3/21: Endoscopy ?3/22: external biliary drain placement  ? ? ?Central access: PICC line planned 09/03/21 ?TPN start date: 09/03/21 ? ?Nutritional Goals: ?Goal TPN rate is 90 mL/hr (provides 104 g of protein and 2186 kcals per day) ? ?RD Assessment: ?Estimated Needs ?Total Energy Estimated Needs: 2000-2200 kcal ?Total Protein Estimated Needs: 100-110 grams ?Total Fluid Estimated Needs: >/= 2 L/day ? ?Current Nutrition:  ?NPO ? ?Plan:  ?Continue TPN at goal rate of 90 mL/hr at 1800 ?Electrolytes in TPN: Na 44mq/L, K 514m/L, Ca 63m61mL, Mg 0mE34m, and Phos is 2.1, will increase Phos to 25 mmol/L. Cl:Ac 1:1 ?Add standard MVI to TPN ?Reduce trace elements to 1/2 due to elevated t bili and jaundice ?Add thiamine 100 mg to TPN x 5 days (day 3/5) ?Increase insulin glargine to 10 u daily ?Initiate Moderate q6h SSI and adjust as needed  ?Monitor TPN  labs on Mon/Thurs, daily until stable ? ?SamaSherilyn BankerarmD ?Clinical Pharmacist ?09/05/2021 ?7:08 AM ? ? ?

## 2021-09-05 NOTE — Telephone Encounter (Signed)
Oral Oncology Patient Advocate Encounter ?  ?Received notification from [facility] that prior authorization for Diamond Hinton is required. ?  ?PA submitted on CoverMyMeds on 09/06/21 ?Key [id number] ?Status is denied - Patient has not experienced an intolerable event or has a contraindication to [place] or [employer]. ? ?Dennison Nancy CPHT ?Specialty Pharmacy Patient Advocate ?[place] ?Phone [phone] ?Fax [phone] ? ? ? ?  ? ?

## 2021-09-05 NOTE — Progress Notes (Addendum)
?Progress Note ? ? ?Patient: Diamond Hinton VPX:[id number] DOB: 1956-02-03 DOA: 08/29/2021     7 ?DOS: the patient was seen and examined on 09/05/2021 ?  ?Brief hospital course: ?This 66 years old female with PMH significant for diabetes, hypertension, hypothyroidism, prior cholecystectomy sent by her PCP with abnormal labs notably elevated liver enzymes.  Patient reports having gastric reflux symptoms for about a month and has been receiving treatment with famotidine, sucralfate but her symptoms had not improved.  Work-up in the ED shows elevated AST ALT and total bilirubin, lipase 395, UA: Large LE.  CT abdomen and pelvis shows marked distention of the stomach finding suggestive of possible benign or malignant stricture.  Endoscopic evaluation recommended.  Left breast mass with slightly spiculated margins consider outpatient mammogram.   ? ?GI was consulted.  They attempted ERCP on 3/21, but unable to proceed due to food and liquid residue in the stomach.  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   ? ?IR consulted for biliary drain placement on 3/22, but unable to advance drain past sphincter of Oddi. ?Returned to IR on 3/24 for advancement of drain.   ? ?Preliminary biopsy results showing this is likely metastatic breast cancer.  Surgery consulted for consideration of a J tube for nutrition.  Oncology also consulted. ? ?Assessment and Plan: ?* Obstructive jaundice ?Presented with jaundice, with history of prior cholecystectomy, with elevated LFTs, lipase. ?GI was consulted and recommended ERCP. ?ERCP was attempted on 3/21 but unable to be performed due to food and liquid residue within the stomach.  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 ?--Further evaluation as outlined. ?--Continue pain control as needed. ?--Continue IV Zofran for nausea and vomiting. ?--Continue IV hydration. ? ?Elevated lipase ?No focal abnormality seen in the pancreas on CT.   Suspect related to obstructive process. ?-- Biliary drain placed by IR on 3/22 ? ?Gastric outlet obstruction ?CT  A/P: Shows gastric distention with possible pyloric stenosis. ?Severe stricture in the first portion of the duodenum noted during attempted ERCP on 3/21, biopsied. ? ?MRCP on 3/21  ?IMPRESSION: ?--Suspected masslike duodenal wall thickening extending to the ?ampulla, worrisome for duodenal/ampullary carcinoma. Consider ?endoscopy/EUS for further evaluation. ?--Associated intrahepatic/extrahepatic ductal dilatation. Common duct ?measures 18 mm. No choledocholithiasis is seen. ?--Associated prominent pancreatic duct, extending to the ampulla, ?measuring 6 mm. ?--Associated duodenal narrowing/stricture with suspected functional ?gastric outlet obstruction. ?--2.3 cm irregular/spiculated lesion in the left inferior breast, ?suspicious for primary breast neoplasm. Mammographic correlation is ?suggested. ? ?Continue NPO.  ?NG tube clamped yesterday - well tolerated. ?PICC line placed 3/25 and TPN started. ?Biliary drain placed by IR 3/22. ? ?General surgery consulted - plan for Gastrografin study, after which may be able to start clear liquid diet. ? ?Aspiration precautions. ?Management as above ?Continue IV Protonix IV ?Follow-up biopsy pathology ? ? ? ?Metastatic breast cancer ([place]) ?New diagnosis this admission.  Presented with biliary obstructive symptoms and noted to have spiculated mass in left breast on imaging upon admission.  Now appears this is metastatic breast malignancy, based on preliminary pathology results GI's endoscopic biopsy obtained during attempted ERCP. ?--Oncology consulted - see Dr. Elroy Channel recommendations ?--Hormone receptors added to path orders ?-- Goals of care discussions underway ? ?--L breast biopsy pending ?--NM bone scan pending ?--Hormone receptor's pending along with final pathology from duodenal biopsy. ? ? ?Uncontrolled type 2 diabetes mellitus with hyperglycemia, without  long-term current use of insulin ([place]) ?Sliding scale  insulin coverage ? ?History of cholecystectomy ?Noted history in the setting of current suspected obstructive jaundice ? ?UTI (urinary tract infection) ?Patient treated with Rocephin in the ED. ?She denies any urinary symptoms. ?Monitor for symptoms. ?On Zosyn with biliary drain. ? ?Abnormal LFTs ?Hepatic steatosis on CT with dilatation of intra and extra biliary ducts.   ?Concern for malignant process, as outlined. ?--Follow CMP ? ?Hypophosphatemia ?Phos 2.1 this AM, K-phos IV replacement ordered.  Monitor and replace as needed. ? ?Constipation ?MiraLAX if able to start on clear liquids or while NG tube clamped.  Dulcolax suppository if unable to give oral meds ? ?Stenosis of duodenum ?Pathology results pending.   ?Biliary drain placed by IR 3/22. ?Follow-up surgery's recommendations for options to relieve the obstruction. ?Meantime requires NG tube to suction. ?Trial of NGT clamped today. ? ?Hypokalemia ?IV repletion while n.p.o. and monitor ? ?3/24: K 3.4, will give K-riders IV 10 mEqx3 replacement. ? ?Hypertension ?Continue IV hydralazine as needed while n.p.o. ? ?Adult hypothyroidism ?On IV levothyroxine while n.p.o. ? ? ? ? ?  ? ?Subjective: Patient awake sitting up in bed when seen this morning.  She reports having a restless night due to back discomfort from being in the bed.  Says that her upper back just felt extremely tired and achy.  She felt a little weak by the end of yesterday which was a busy day for her.  She did walk around the unit and sat up in the chair earlier this morning.  Currently no other acute complaints.  Continues to tolerate NG tube being clamped ? ?Physical Exam: ?Vitals:  ? 09/04/21 1536 09/04/21 2016 09/05/21 0606 09/05/21 0745  ?BP: (!) 169/83 (!) 162/77 (!) 162/92 (!) 145/82  ?Pulse: 65 64 79 75  ?Resp: '18 17 17 18  '$ ?Temp: 99 ?F (37.2 ?C) 98.3 ?F (36.8 ?C) 98.6 ?F (37 ?C) 98.1 ?F (36.7 ?C)  ?TempSrc: Oral   Oral  ?SpO2: 98% 97%  96% 98%  ?Weight:      ?Height:      ? ?General exam: awake, alert, no acute distress ?HEENT: NG tube in place clamped, moist mucus membranes, hearing grossly normal  ?Respiratory system: CTAB, no wheezes, rales or rhonchi, normal respiratory effort. ?Cardiovascular system: normal S1/S2, RRR, no pedal edema.   ?Gastrointestinal system: soft, non-tender, hypoactive bowel sounds, biliary drain remains in place. ?Central nervous system: A&O x4. no gross focal neurologic deficits, normal speech ?Extremities: moves all, no edema, normal tone ?Skin: dry, intact, normal temperature ?Psychiatry: normal mood, congruent affect, judgement and insight appear normal ? ? ?Data Reviewed: ? ?Labs reviewed and notable for total bili improving 3.2, glucose 241, calcium 8.6, phosphorus 2.1, albumin 2.9, AST 54, ALT 103, triglycerides 159 ? ?NM bone scan negative for any metastatic disease. ? ? ? ?Family Communication: none at bedside on rounds ? ?Disposition: ?Status is: Inpatient ?Remains inpatient appropriate because: Ongoing evaluation not appropriate for the outpatient setting.   ? ? ? Planned Discharge Destination: Home with Home Health ? ? ? ?Time spent: 35 minutes ? ?Author: ?Ezekiel Slocumb, DO ?09/05/2021 2:39 PM ? ?For on call review [email or web address].  ?

## 2021-09-05 NOTE — Progress Notes (Addendum)
[facility] ?SURGICAL PROGRESS NOTE (cpt [phone]) ? ?Hospital Day(s): 7. ? ?Interval History: Patient seen and examined, no acute events or new complaints overnight. Patient reports she is feeling okay this morning. She reports a restless night secondary to her back being uncomfortable on the bed. She denied any fever, chills, nausea, emesis, distension, or abdominal pain. Hyperbilirubinemia continues to improve; down to 3.2. Renal function remains normal; sCr - 0.55. Mild hypophosphatemia to 2.1; otherwise no significant electrolyte derangements. Biliary drain with 900 ccs out. She is currently NPO with TPN. NGT has been clamped and she is tolerating this well. Plan for UGI today.  ? ?Review of Systems:  ?Constitutional: denies fever, chills  ?HEENT: denies cough or congestion  ?Respiratory: denies any shortness of breath  ?Cardiovascular: denies chest pain or palpitations  ?Gastrointestinal: denies abdominal pain, N/V ?Genitourinary: denies burning with urination or urinary frequency ?Musculoskeletal: denies pain, decreased motor or sensation ? ?Vital signs in last 24 hours: [min-max] current  ?Temp:  [98.3 ?F (36.8 ?C)-99 ?F (37.2 ?C)] 98.6 ?F (37 ?C) (03/27 0606) ?Pulse Rate:  [62-79] 79 (03/27 0606) ?Resp:  [17-18] 17 (03/27 0606) ?BP: (151-169)/(77-92) 162/92 (03/27 0606) ?SpO2:  [96 %-98 %] 96 % (03/27 0606)     Height: 5' 3.5" (161.3 cm) Weight: 59.7 kg BMI (Calculated): 22.95  ? ?Intake/Output last 2 shifts:  ?03/26 0701 - 03/27 0700 ?In: 2285.5 [I.V.:2125.5; IV Piggyback:150] ?Out: 900 [Drains:900]  ? ?Physical Exam:  ?Constitutional: alert, cooperative and no distress  ?HENT: normocephalic without obvious abnormality; NGT clamped ?Eyes: PERRL, EOM's grossly intact and symmetric  ?Respiratory: breathing non-labored at rest  ?Cardiovascular: regular rate and sinus rhythm  ?Gastrointestinal: soft, non-tender, and non-distended, no rebound/guarding. Biliary drain present; output remains bilious   ?Musculoskeletal: no edema or wounds, motor and sensation grossly intact, NT  ? ? ?Labs:  ? ?  Latest Ref Rng & Units 09/02/2021  ?  3:47 AM 09/01/2021  ?  4:11 AM 08/31/2021  ?  4:26 AM  ?CBC  ?WBC 4.0 - 10.5 K/uL 10.4   11.1   9.3    ?Hemoglobin 12.0 - 15.0 g/dL 14.3   13.8   13.4    ?Hematocrit 36.0 - 46.0 % 42.3   41.4   39.6    ?Platelets 150 - 400 K/uL 274   260   241    ? ? ?  Latest Ref Rng & Units 09/05/2021  ?  6:35 AM 09/04/2021  ?  4:27 AM 09/03/2021  ?  5:21 AM  ?CMP  ?Glucose 70 - 99 mg/dL 241   221   109    ?BUN 8 - 23 mg/dL '22   30   29    '$ ?Creatinine 0.44 - 1.00 mg/dL 0.55   0.64   0.77    ?Sodium 135 - 145 mmol/L 135   141   142    ?Potassium 3.5 - 5.1 mmol/L 3.7   3.7   3.7    ?Chloride 98 - 111 mmol/L 103   107   104    ?CO2 22 - 32 mmol/L '23   28   29    '$ ?Calcium 8.9 - 10.3 mg/dL 8.6   8.4   8.7    ?Total Protein 6.5 - 8.1 g/dL 6.3   6.2   6.4    ?Total Bilirubin 0.3 - 1.2 mg/dL 3.2   3.9   4.9    ?Alkaline Phos 38 - 126 U/L 94   99  125    ?AST 15 - 41 U/L 54   73   100    ?ALT 0 - 44 U/L 103   122   149    ? ? ?Imaging studies: No new pertinent imaging studies; UGI pending  ? ? ?Assessment/Plan: (ICD-10's: K31.1) ?66 y.o. female with GOO and (improving) obstructive juandice of malignant etiology thought to be possible GI primary vs metastatic left breast mass.  ? ? - Continue plan for UGI study today to determine if PO intake of liquids is feasible. Gastrojejunostomy can be considered in palliative setting as long as she is not a candidate for a more definitive surgery.  ? - Continue TPN; monitor electrolytes ? - Continue NPO for now; NGT clamped  ? - Monitor abdominal examination; on-going bowel function  ? 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 ?All of the above findings  and recommendations were discussed with the patient, and the medical team, and all of patient's questions were answered to her expressed satisfaction. ? ?-- ?Edison Simon, PA-C ?[facility] ?09/05/2021, 7:39 AM ?[phone] ?M-F: 7am - 4pm ? ?

## 2021-09-06 ENCOUNTER — Telehealth: Payer: Self-pay | Admitting: Pharmacist

## 2021-09-06 ENCOUNTER — Other Ambulatory Visit (HOSPITAL_COMMUNITY): Payer: Self-pay

## 2021-09-06 DIAGNOSIS — Z515 Encounter for palliative care: Secondary | ICD-10-CM | POA: Diagnosis not present

## 2021-09-06 DIAGNOSIS — N6323 Unspecified lump in the left breast, lower outer quadrant: Secondary | ICD-10-CM | POA: Diagnosis not present

## 2021-09-06 DIAGNOSIS — Z7189 Other specified counseling: Secondary | ICD-10-CM

## 2021-09-06 DIAGNOSIS — K311 Adult hypertrophic pyloric stenosis: Secondary | ICD-10-CM | POA: Diagnosis not present

## 2021-09-06 DIAGNOSIS — N6320 Unspecified lump in the left breast, unspecified quadrant: Secondary | ICD-10-CM

## 2021-09-06 DIAGNOSIS — K831 Obstruction of bile duct: Secondary | ICD-10-CM | POA: Diagnosis not present

## 2021-09-06 LAB — GLUCOSE, CAPILLARY
Glucose-Capillary: 271 mg/dL — ABNORMAL HIGH (ref 70–99)
Glucose-Capillary: 271 mg/dL — ABNORMAL HIGH (ref 70–99)
Glucose-Capillary: 314 mg/dL — ABNORMAL HIGH (ref 70–99)
Glucose-Capillary: 303 mg/dL — ABNORMAL HIGH (ref 70–99)

## 2021-09-06 LAB — COMPREHENSIVE METABOLIC PANEL
ALT: 86 U/L — ABNORMAL HIGH (ref 0–44)
AST: 38 U/L (ref 15–41)
Albumin: 3.2 g/dL — ABNORMAL LOW (ref 3.5–5.0)
Alkaline Phosphatase: 87 U/L (ref 38–126)
Anion gap: 9 (ref 5–15)
BUN: 22 mg/dL (ref 8–23)
CO2: 23 mmol/L (ref 22–32)
Calcium: 9.1 mg/dL (ref 8.9–10.3)
Chloride: 106 mmol/L (ref 98–111)
Creatinine, Ser: 0.67 mg/dL (ref 0.44–1.00)
GFR, Estimated: >60 mL/min (ref >60)
Glucose, Bld: 289 mg/dL — ABNORMAL HIGH (ref 70–99)
Potassium: 4.1 mmol/L (ref 3.5–5.1)
Sodium: 138 mmol/L (ref 135–145)
Total Bilirubin: 2.7 mg/dL — ABNORMAL HIGH (ref 0.3–1.2)
Total Protein: 6.7 g/dL (ref 6.5–8.1)

## 2021-09-06 LAB — MAGNESIUM: Magnesium: 2.1 mg/dL (ref 1.7–2.4)

## 2021-09-06 LAB — PHOSPHORUS: Phosphorus: 3.3 mg/dL (ref 2.5–4.6)

## 2021-09-06 MED ORDER — ALPRAZOLAM 0.25 MG PO TABS
0.2500 mg | ORAL_TABLET | Freq: Three times a day (TID) | ORAL | Status: DC | PRN
  Administered 2021-09-06: 0.25 mg via ORAL
  Filled 2021-09-06: qty 1

## 2021-09-06 MED ORDER — LIDOCAINE-EPINEPHRINE 2 %-1:100000 IJ SOLN
1.7000 mL | Freq: Once | INTRAMUSCULAR | Status: DC
  Filled 2021-09-06: qty 1.7

## 2021-09-06 MED ORDER — LORAZEPAM 2 MG/ML IJ SOLN: 0.5000 mg | Freq: Four times a day (QID) | INTRAMUSCULAR | Status: DC | PRN

## 2021-09-06 MED ORDER — HYDRALAZINE HCL 20 MG/ML IJ SOLN: 5.0000 mg | Freq: Four times a day (QID) | INTRAMUSCULAR | Status: DC | PRN

## 2021-09-06 MED ORDER — LEVOTHYROXINE SODIUM 50 MCG PO TABS
75.0000 ug | ORAL_TABLET | Freq: Every day | ORAL | Status: DC
  Administered 2021-09-06 – 2021-09-14 (×9): 75 ug via ORAL
  Filled 2021-09-06 (×9): qty 2

## 2021-09-06 MED ORDER — CALCIUM CARBONATE ANTACID 500 MG PO CHEW
1.0000 | CHEWABLE_TABLET | Freq: Three times a day (TID) | ORAL | Status: DC | PRN
  Administered 2021-09-06 – 2021-09-07 (×4): 200 mg via ORAL
  Filled 2021-09-06 (×4): qty 1

## 2021-09-06 MED ORDER — HYDROCHLOROTHIAZIDE 25 MG PO TABS
25.0000 mg | ORAL_TABLET | Freq: Every day | ORAL | Status: DC
  Administered 2021-09-06 – 2021-09-14 (×8): 25 mg via ORAL
  Filled 2021-09-06 (×8): qty 1

## 2021-09-06 MED ORDER — INSULIN GLARGINE-YFGN 100 UNIT/ML ~~LOC~~ SOLN
25.0000 [IU] | Freq: Every day | SUBCUTANEOUS | Status: DC
  Filled 2021-09-06: qty 0.25

## 2021-09-06 MED ORDER — LIDOCAINE-EPINEPHRINE 1 %-1:100000 IJ SOLN
10.0000 mL | Freq: Once | INTRAMUSCULAR | Status: DC
  Filled 2021-09-06: qty 10

## 2021-09-06 MED ORDER — INSULIN GLARGINE-YFGN 100 UNIT/ML ~~LOC~~ SOLN
15.0000 [IU] | Freq: Every day | SUBCUTANEOUS | Status: DC
  Administered 2021-09-06: 15 [IU] via SUBCUTANEOUS
  Filled 2021-09-06: qty 0.15

## 2021-09-06 MED ORDER — TRAVASOL 10 % IV SOLN
INTRAVENOUS | Status: AC
  Filled 2021-09-06: qty 1036.8

## 2021-09-06 NOTE — Progress Notes (Signed)
[facility] ?SURGICAL PROGRESS NOTE (cpt [phone]) ? ?Hospital Day(s): 8. ? ?Interval History: Patient seen and examined, no acute events or new complaints overnight. Patient reports she is doing okay. Some nausea but she is unsure if this is related to the PO intake vs anxiety. No abdominal pain, fever, chills, emesis. Biliary drain with 1200 ccs out. She is currently on CLD with TPN.  ? ?Review of Systems:  ?Constitutional: denies fever, chills  ?HEENT: denies cough or congestion  ?Respiratory: denies any shortness of breath  ?Cardiovascular: denies chest pain or palpitations  ?Gastrointestinal: denies abdominal pain, N/V ?Genitourinary: denies burning with urination or urinary frequency ?Musculoskeletal: denies pain, decreased motor or sensation ? ?Vital signs in last 24 hours: [min-max] current  ?Temp:  [98.1 ?F (36.7 ?C)-99.2 ?F (37.3 ?C)] 98.6 ?F (37 ?C) (03/28 9211) ?Pulse Rate:  [67-106] 80 (03/28 0808) ?Resp:  [18-19] 18 (03/28 9417) ?BP: (124-171)/(70-92) 149/87 (03/28 4081) ?SpO2:  [98 %-100 %] 98 % (03/28 0808)     Height: 5' 3.5" (161.3 cm) Weight: 59.7 kg BMI (Calculated): 22.95  ? ?Intake/Output last 2 shifts:  ?03/27 0701 - 03/28 0700 ?In: 1582.1 [P.O.:240; I.V.:1174.3; IV Piggyback:157.8] ?Out: 1900 [Drains:1900]  ? ?Physical Exam:  ?Constitutional: alert, cooperative and no distress  ?HENT: normocephalic without obvious abnormality; NGT clamped ?Eyes: PERRL, EOM's grossly intact and symmetric  ?Respiratory: breathing non-labored at rest  ?Cardiovascular: regular rate and sinus rhythm  ?Gastrointestinal: soft, non-tender, and non-distended, no rebound/guarding. Biliary drain present; output remains bilious  ?Musculoskeletal: no edema or wounds, motor and sensation grossly intact, NT  ? ? ?Labs:  ? ?  Latest Ref Rng & Units 09/02/2021  ?  3:47 AM 09/01/2021  ?  4:11 AM 08/31/2021  ?  4:26 AM  ?CBC  ?WBC 4.0 - 10.5 K/uL 10.4   11.1   9.3    ?Hemoglobin 12.0 - 15.0 g/dL 14.3   13.8   13.4     ?Hematocrit 36.0 - 46.0 % 42.3   41.4   39.6    ?Platelets 150 - 400 K/uL 274   260   241    ? ? ?  Latest Ref Rng & Units 09/06/2021  ?  5:46 AM 09/05/2021  ?  6:35 AM 09/04/2021  ?  4:27 AM  ?CMP  ?Glucose 70 - 99 mg/dL 289   241   221    ?BUN 8 - 23 mg/dL '22   22   30    '$ ?Creatinine 0.44 - 1.00 mg/dL 0.67   0.55   0.64    ?Sodium 135 - 145 mmol/L 138   135   141    ?Potassium 3.5 - 5.1 mmol/L 4.1   3.7   3.7    ?Chloride 98 - 111 mmol/L 106   103   107    ?CO2 22 - 32 mmol/L '23   23   28    '$ ?Calcium 8.9 - 10.3 mg/dL 9.1   8.6   8.4    ?Total Protein 6.5 - 8.1 g/dL 6.7   6.3   6.2    ?Total Bilirubin 0.3 - 1.2 mg/dL 2.7   3.2   3.9    ?Alkaline Phos 38 - 126 U/L 87   94   99    ?AST 15 - 41 U/L 38   54   73    ?ALT 0 - 44 U/L 86   103   122    ? ? ?Imaging studies: No new  pertinent imaging studies; UGI pending  ? ? ?Assessment/Plan: (ICD-10's: K31.1) ?66 y.o. female with GOO and (improving) obstructive juandice of malignant etiology thought to be possible GI primary vs metastatic left breast mass.  ? ? - Dr Christian Mate obtained core biopsy of left breast mass at bedside. Please refer to his documentation for details of this. Pathology ordered  ? ? - Continue CLD + nutritional supplementation for now. Would not advance further ? - Continue TPN; monitor electrolytes ? - No emergent surgical intervention. We will continue to assess her tolerance of liquid diet. At most, we could offer palliative diversion (ex: gastrojejunostomy).  ? - Monitor abdominal examination; on-going bowel function  ? 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 ?All of the above findings and recommendations were discussed with the patient, and the medical team, and all of patient's questions were answered to her expressed  satisfaction. ? ?-- ?Edison Simon, PA-C ?[facility] ?09/06/2021, 8:43 AM ?[phone] ?M-F: 7am - 4pm ? ?

## 2021-09-06 NOTE — Progress Notes (Addendum)
Nutrition Follow-up ? ?DOCUMENTATION CODES:  ? ?Not applicable ? ?INTERVENTION:  ? ?-D/c Boost Breeze po TID, each supplement provides 250 kcal and 9 grams of protein  ?-Continue Ensure Enlive po BID, each supplement provides 350 kcal and 20 grams of protein ?-TPN management per pharmacy ?-RD will follow for diet advancement and adjust supplement regimen as appropriate ? ?NUTRITION DIAGNOSIS:  ? ?Increased nutrient needs related to acute illness, catabolic illness as evidenced by estimated needs. ? ?Ongoing ? ?GOAL:  ? ?Patient will meet greater than or equal to 90% of their needs ? ?Met with TPN ? ?MONITOR:  ? ?Diet advancement, Labs, Weight trends, I & O's, Other (Comment) (TPN regimen) ? ?REASON FOR ASSESSMENT:  ? ?Consult ?New TPN/TNA ? ?ASSESSMENT:  ? ?66 year-old female with medical history of anxiety, HTN, HLD, GERD, anemia, and gallstones. She presented to the ED due to jaundice and RUQ abdominal pain. On admission she had elevated bilirubin, LFTs, alk phos. 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 ? ?3/22- NGT placed ?3/25- TPN initiated ?3/28- s/p lt breast biopsy at bedside ?3/27- contrast study reveals evidence of contrast in stomach and liquids passing at a very slow rate; NGT removed and advanced to clear liquid diet ? ?Reviewed I/O's: -318 ml x 24 hours and +644 ml since admission ? ?Biliary drain output: 1.9 L x 24 hours  ? ?Pt meeting with palliative care at time of visit. Per notes, pt would like to pursue aggressive treatment at this time. Per general surgery notes, pt is tolerating liquids well, but has been very cautious. Noted both Boost Breeze and Ensure supplements have been ordered; pt refusing Boost Breeze. Oncology considering referral to [facility] for possible duodenal  stent placement. Surgery has been consulted for G-J tube placement.  ? ?Pt remains on TPN, receiving at goal rate of 90 ml/hr, which provides 2186 kcals and 104 grams protein, meeting 100% of estimated protein needs. Pt will require additional support from TPN, as pt unable to meet nutritional needs PO due to limitations of clear liquid diet as well as partial obstruction.  ? ?Medications reviewed and include miralax.  ? ?Labs reviewed: CBGS: 212-248 (inpatient orders for glycemic control are 0-15 units insulin aspart every 6 hours and 15 units insulin glargine-yfgn daily).   ? ?Diet Order:   ?Diet Order   ? ?       ?  Diet clear liquid Room service appropriate? Yes; Fluid consistency: Thin  Diet effective now       ?  ? ?  ?  ? ?  ? ? ?EDUCATION NEEDS:  ? ?Not appropriate for education at this time ? ?Skin:  Skin Assessment: Reviewed RN Assessment ? ?Last BM:  09/05/21 ? ?Height:  ? ?Ht Readings from Last 1 Encounters:  ?08/29/21 5' 3.5" (1.613 m)  ? ? ?Weight:  ? ?Wt Readings from Last 1 Encounters:  ?09/03/21 59.7 kg  ? ? ?Ideal Body Weight:  53.4 kg ? ?BMI:  Body mass index is 22.95 kg/m?. ? ?Estimated Nutritional Needs:  ? ?Kcal:  2000-2200 kcal ? ?Protein:  100-110 grams ? ?Fluid:  >/= 2 L/day ? ? ? ?Loistine Chance, RD, LDN, CDCES ?Registered Dietitian II ?Certified Diabetes Care and Education Specialist ?Please refer to [facility] for RD and/or RD on-call/weekend/after hours  pager  ?

## 2021-09-06 NOTE — Telephone Encounter (Signed)
Oral Oncology Pharmacist Encounter ? ?Received new prescription for Kisqali (ribociclib) for the treatment of newly diagnosed metastatic breast cancer, ER positive/HER2 and PR negative in conjunction with fulvestrant, planned duration until disease progression or unacceptable drug toxicity. ? ?CMP from 09/06/21 assessed, patient's hepatic function is improving, continue to monitor. If hepatic impairment is moderate to severe the dose would need to be reduced to 490m 21 on/7off.  to improve. Patient will need ECG prior to therapy initiation, at day 14 of the first cycle, at the start of the second cycle, and as clinically indicated. Prescription dose and frequency assessed.  ? ?Current medication list in Epic reviewed, no DDIs with ribociclib identified based on patient's current home med list, this will need to be re-evaluated at discharge. ? ?Evaluated chart and no patient barriers to medication adherence identified.  ? ?[facility]. ? ?Oral Oncology Clinic will continue to follow for insurance authorization, copayment issues, initial counseling and start date. ? ? ?ADarl Pikes PharmD, BCPS, BCOP, CPP ?Hematology/Oncology Clinical Pharmacist Practitioner ?[place]/[facility]/AP Oral Chemotherapy Navigation [facility] ?[phone]? ?09/06/2021 8:38 AM ?

## 2021-09-06 NOTE — Telephone Encounter (Signed)
Oral Oncology Pharmacist Encounter ? ?Received new prescription for Verzenio (abemaciclib) for the treatment of newly diagnosed metastatic breast cancer, ER positive/HER2 and PR negative in conjunction with fulvestrant, planned duration until disease progression or unacceptable drug toxicity. ? ?CMP from 09/06/21 assessed, patient's hepatic function is improving, continue to monitor. If hepatic impairment is severe the dose would need to be reduced to once daily dosing. ? ?Current medication list in Epic reviewed, no DDIs with abemaciclib identified based on patient's current home med list, this will need to be re-evaluated at discharge. ? ?Evaluated chart and no patient barriers to medication adherence identified.  ? ?Oral Oncology Clinic will continue to follow for insurance authorization, copayment issues, initial counseling and start date. ? ? ?Darl Pikes, PharmD, BCPS, BCOP, CPP ?Hematology/Oncology Clinical Pharmacist Practitioner ?[place]/[facility]/AP Oral Chemotherapy Navigation Clinic ?[phone] ? ?09/06/2021 2:28 PM ? ?

## 2021-09-06 NOTE — TOC Progression Note (Signed)
Transition of Care (TOC) - Progression Note  ? ? ?Patient Details  ?Name: HENNA DERDERIAN ?MRN: [id number] ?Date of Birth: 11-19-1955 ? ?Transition of Care (TOC) CM/SW Contact  ?Beverly Sessions, RN ?Phone Number: ?09/06/2021, 12:03 PM ? ?Clinical Narrative:    ?Referral was made to [facility] with [facility] for home TPN.  ? ?Malachy Mood with [facility] updated ? ? ?  ?  ? ?Expected Discharge Plan and Services ?  ?  ?  ?  ?  ?                ?  ?  ?  ?  ?  ?  ?  ?  ?  ?  ? ? ?Social Determinants of Health (SDOH) Interventions ?  ? ?Readmission Risk Interventions ?   ? View : No data to display.  ?  ?  ?  ? ? ?

## 2021-09-06 NOTE — Progress Notes (Signed)
?Progress Note ? ? ?Patient: Diamond Hinton ZOX:[id number] DOB: 07/25/1955 DOA: 08/29/2021     8 ?DOS: the patient was seen and examined on 09/06/2021 ?  ?Brief hospital course: ?This 66 years old female with PMH significant for diabetes, hypertension, hypothyroidism, prior cholecystectomy sent by her PCP with abnormal labs notably elevated liver enzymes.  Patient reports having gastric reflux symptoms for about a month and has been receiving treatment with famotidine, sucralfate but her symptoms had not improved.  Work-up in the ED shows elevated AST ALT and total bilirubin, lipase 395, UA: Large LE.  CT abdomen and pelvis shows marked distention of the stomach finding suggestive of possible benign or malignant stricture.  Endoscopic evaluation recommended.  Left breast mass with slightly spiculated margins consider outpatient mammogram.   ? ?GI was consulted.  They attempted ERCP on 3/21, but unable to proceed due to food and liquid residue in the stomach.  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   ? ?IR consulted for biliary drain placement on 3/22, but unable to advance drain past sphincter of Oddi. ?Returned to IR on 3/24 for advancement of drain.   ? ?Preliminary biopsy results showing this is likely metastatic breast cancer.  Surgery consulted for consideration of a J tube for nutrition.  Oncology also consulted. ? ?Assessment and Plan: ?* Obstructive jaundice ?Presented with jaundice, with history of prior cholecystectomy, with elevated LFTs, lipase. ?GI was consulted and recommended ERCP. ?ERCP was attempted on 3/21 but unable to be performed due to food and liquid residue within the stomach.  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 ?--Further evaluation as outlined. ?--Antiemetics & pain control as needed. ? ?LFT's improving, T bili 2.7 today. ? ? ?Elevated lipase ?No focal abnormality seen in the pancreas on CT.  Suspect related to  obstructive process. ?-- Biliary drain placed by IR on 3/22 ? ?Gastric outlet obstruction ?Due to metastatic breast cancer. ?MRCP on 3/21 showed masslike duodenal wall thickening extending to the ampulla, worrisome for duodenal/ampullary carcinoma. ...intrahepatic/extrahepatic ductal dilatation.... prominent pancreatic duct, extending to the ampulla... 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   ?--2.3 cm irregular/spiculated lesion in the left inferior breast, suspicious for primary breast neoplasm.  ? ?General surgery following. ?Biliary drain placed by IR 3/22. ?Now tolerating clear liquids & NG tube out. ?PICC line placed 3/25 and TPN started. ?[place] RN set up for TPN at home. ? ? ? ?Metastatic breast cancer ([place]) ?New diagnosis this admission.  Presented with biliary obstructive symptoms and noted to have spiculated mass in left breast on imaging upon admission.  Biopsied during attempted ERCP and pathology has returned showing invasive lobular carcinoma. ?--Oncology following, Dr. Janese Banks ?--Breast biopsy today, follow up pathology ?--Mgmt per oncology ?--Palliative consulted ?--Other mgmt of complications as outlined  ? ?Uncontrolled type 2 diabetes mellitus with hyperglycemia, without long-term current use of insulin ([place]) ?Diabetes coordinator consulted since patient likely to need insulin at discharge.  ?Increase Semglee 15>>25 units ?Sliding scale insulin coverage. ? ?CBG's elevated now on clear liquids and TPN.   ? ? ? ?History of cholecystectomy ?Noted history in the setting of current suspected obstructive jaundice ? ?UTI (urinary tract infection) ?Patient treated with Rocephin in the ED. ?She denies any urinary symptoms. ?Has been on Zosyn to prevent cholangitis, dc'd today.  Monitor for symptoms. ? ? ?Abnormal LFTs ?Hepatic steatosis on CT with dilatation of intra and extra biliary ducts.   ?Concern  for malignant process, as outlined. ?Improving since biliary drain  placement ?--Follow CMP ? ?Goals of care, counseling/discussion ?Palliative care met with patient today. ?DNR/DNI.  Otherwise continue full scope of care. ? ?Hypophosphatemia ?Phos 2.1 on 3/27, given K-phos IV  ?Monitor and replace as needed. ?Pharmacy following. ? ?Constipation ?Continue MiraLAX.  Dulcolax suppository if needed or not tolerating liquids for MiraLAX. ? ?Stenosis of duodenum ?Confirmed metastatic breast cancer. ?Biliary drain placed by IR 3/22. ?Follow-up with surgery for consideration of duodenal stent as palliative measure.   ?Currently tolerating clears and on TPN. ? ?Hypokalemia ?Ongoing replacement as needed. ?Monitor K and replace PRN. ? ?Hypertension ?Resumed home HCTZ. ?PRN IV meds available. ?Monitor BP. ? ?Adult hypothyroidism ?On IV levothyroxine while n.p.o. ?Resumed on p.o. levothyroxine yesterday seems tolerating well ? ? ? ? ?  ? ?Subjective: Patient seen ambulating the hallway with daughter and then back in her room this morning on rounds.  She reports overall tolerating clear liquids.  She says it does feel strange.  Ambulating well with steady gait.  She asks about needing insulin at home and is never used it before.  She is agreeable if this is what is recommended.  No other acute complaints.  She is processing everything about with palliative care this morning. ? ?Physical Exam: ?Vitals:  ? 09/05/21 1843 09/05/21 2319 09/06/21 0414 09/06/21 1583  ?BP: 124/70 135/75 (!) 144/92 (!) 149/87  ?Pulse: (!) 106 85 88 80  ?Resp: 19 18 19 18   ?Temp: 99.2 ?F (37.3 ?C) 98.4 ?F (36.9 ?C) 98.6 ?F (37 ?C) 98.6 ?F (37 ?C)  ?TempSrc: Oral Oral  Oral  ?SpO2: 98% 99% 98% 98%  ?Weight:      ?Height:      ? ?General exam: awake, alert, no acute distress ?HEENT:moist mucus membranes, hearing grossly normal  ?Respiratory system: normal respiratory effort at rest and with ambulation, on room air. ?Cardiovascular system: normal S1/S2, RRR, no pedal edema.   ?Gastrointestinal system: soft, NT, biliary  drain present. ?Central nervous system: A&O x4. no gross focal neurologic deficits, normal speech ?Extremities: moves all, no edema, normal tone ?Skin: dry, intact, normal temperature ?Psychiatry: normal mood, congruent affect, judgement and insight appear normal ? ? ? ?Data Reviewed: ? ?Labs reviewed and notable for hyperglycemia CBGs 271, 314.  CMP notable for glucose 289, albumin 3.2, ALT 86, T. bili 2.7 ? ?Family Communication: Daughter from [place] is at bedside on rounds ? ?Disposition: ?Status is: Inpatient ?Remains inpatient appropriate because: Ongoing evaluation.  Anticipate discharge in 24 to 48 hours if no further procedures are planned by consultants. ? ? Planned Discharge Destination: Home with Home Health ? ? ? ?Time spent: 35 minutes ? ?Author: ?Ezekiel Slocumb, DO ?09/06/2021 2:54 PM ? ?For on call review [email or web address].  ?

## 2021-09-06 NOTE — Assessment & Plan Note (Signed)
Palliative care met with patient today. ?DNR/DNI.  Otherwise continue full scope of care. ?

## 2021-09-06 NOTE — Consult Note (Signed)
? ?  ?Palliative Medicine ?[place] at [facility] ?[phone] [phone] ? ? ?Name: Diamond Hinton ?Date: 09/06/2021 ?MRN: [id number]  ?DOB: 1955-08-16 ? ?Patient Care Team: ?Virginia Crews, MD as PCP - General (Family Medicine)  ? ? ?REASON FOR CONSULTATION: ?Diamond Hinton is a 66 y.o. female with multiple medical problems including diabetes, hypertension, hypothyroidism, prior cholecystectomy, who was hospitalized on 08/29/2021 with obstructive jaundice.  CT of the abdomen and pelvis and MRCP showed duodenal wall thickening concerning for neoplastic process.  Patient underwent upper endoscopy with findings of severe duodenal stenosis.  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  Patient underwent biliary drain placement by IR.  Patient's hospitalization has been complicated by ongoing gastric outlet obstruction.  Palliative care was consulted up address goals. ? ?SOCIAL HISTORY:    ? reports that she has never smoked. She has never used smokeless tobacco. She reports that she does not drink alcohol and does not use drugs. ? ?Patient is married and lives at home with her husband.  She was her husband's primary caregiver as he has Parkinson's and secondary memory impairment.  She has two daughters who live nearby, a son in [place], and another daughter in [place].  Patient did not work outside the home. ? ?ADVANCE DIRECTIVES:  ?Does not have ? ?CODE STATUS: DNR ? ?PAST MEDICAL HISTORY: ?Past Medical History:  ?Diagnosis Date  ? Anemia   ? Anxiety   ? Gallstones   ? GERD (gastroesophageal reflux disease)   ? Hyperlipidemia   ? Hypertension   ? ? ?PAST SURGICAL HISTORY:  ?Past Surgical History:  ?Procedure Laterality Date  ? BILATERAL SALPINGECTOMY  04/03/1997  ? CHOLECYSTECTOMY N/A 07/07/2015  ? Procedure: LAPAROSCOPIC CHOLECYSTECTOMY ;  Surgeon: Jules Husbands, MD;  Location: [facility] ORS;  Service: General;  Laterality: N/A;  ?  ESOPHAGOGASTRODUODENOSCOPY  08/30/2021  ? Procedure: ESOPHAGOGASTRODUODENOSCOPY (EGD);  Surgeon: Lucilla Lame, MD;  Location: [facility] ENDOSCOPY;  Service: Endoscopy;;  ? GALLBLADDER SURGERY  07/07/2015  ? [place]  ? IR BILIARY DRAIN PLACEMENT WITH CHOLANGIOGRAM  08/31/2021  ? IR CONVERT BILIARY DRAIN TO INT EXT BILIARY DRAIN  09/02/2021  ? ? ?HEMATOLOGY/ONCOLOGY HISTORY:  ?Oncology History  ? No history exists.  ? ? ?ALLERGIES:  is allergic to peanuts [peanut oil]. ? ?MEDICATIONS:  ?Current Facility-Administered Medications  ?Medication Dose Route Frequency Provider Last Rate Last Admin  ? 0.9 %  sodium chloride infusion   Intravenous PRN Nicole Kindred A, DO 10 mL/hr at 09/05/21 0256 Infusion Verify at 09/05/21 0256  ? acetaminophen (TYLENOL) tablet 650 mg  650 mg Oral Q6H PRN Athena Masse, MD      ? Or  ? acetaminophen (TYLENOL) suppository 650 mg  650 mg Rectal Q6H PRN Athena Masse, MD      ? bisacodyl (DULCOLAX) suppository 10 mg  10 mg Rectal Daily PRN Ezekiel Slocumb, DO      ? Chlorhexidine Gluconate Cloth 2 % PADS 6 each  6 each Topical Daily Nicole Kindred A, DO   6 each at 09/06/21 1517  ? feeding supplement (BOOST / RESOURCE BREEZE) liquid 1 Container  1 Container Oral TID BM Ronny Bacon, MD   1 Container at 09/05/21 1718  ? feeding supplement (ENSURE ENLIVE / ENSURE PLUS) liquid 237 mL  237 mL Oral BID BM Ronny Bacon, MD   237 mL at 09/06/21 0910  ? hydrALAZINE (APRESOLINE) injection 10 mg  10 mg Intravenous Q6H PRN Nicole Kindred  A, DO   10 mg at 09/05/21 1808  ? hydrochlorothiazide (HYDRODIURIL) tablet 25 mg  25 mg Oral Daily Nicole Kindred A, DO   25 mg at 09/06/21 3903  ? insulin aspart (novoLOG) injection 0-15 Units  0-15 Units Subcutaneous Q6H Dorothe Pea, RPH   8 Units at 09/06/21 0535  ? insulin glargine-yfgn [facility]) injection 15 Units  15 Units Subcutaneous Daily Sherilyn Banker, RPH   15 Units at 09/06/21 0092  ? iohexol (OMNIPAQUE) 350 MG/ML injection 50 mL  50 mL Per  Tube Once PRN Greggory Keen, MD      ? levothyroxine (SYNTHROID) tablet 75 mcg  75 mcg Oral Q0600 Nicole Kindred A, DO   75 mcg at 09/06/21 3300  ? lidocaine-EPINEPHrine (XYLOCAINE W/EPI) 1 %-1:100000 (with pres) injection 10 mL  10 mL Intradermal Once Ronny Bacon, MD      ? lidocaine-EPINEPHrine (XYLOCAINE W/EPI) 2 %-1:100000 (with pres) injection 1.7 mL  1.7 mL Intradermal Once Ronny Bacon, MD      ? LORazepam (ATIVAN) injection 0.5 mg  0.5 mg Intravenous Q6H PRN Shilo Pauwels, Kirt Boys, NP      ? menthol-cetylpyridinium (CEPACOL) lozenge 3 mg  1 lozenge Oral PRN Nicole Kindred A, DO      ? metoprolol tartrate (LOPRESSOR) injection 5 mg  5 mg Intravenous Q6H PRN Nicole Kindred A, DO   5 mg at 09/05/21 1653  ? morphine (PF) 2 MG/ML injection 2 mg  2 mg Intravenous Q2H PRN Athena Masse, MD   2 mg at 09/02/21 2204  ? ondansetron (ZOFRAN) tablet 4 mg  4 mg Oral Q6H PRN Athena Masse, MD      ? Or  ? ondansetron [facility]) injection 4 mg  4 mg Intravenous Q6H PRN Athena Masse, MD   4 mg at 09/06/21 0532  ? piperacillin-tazobactam (ZOSYN) IVPB 3.375 g  3.375 g Intravenous Q8H Annamaria Helling, DO 12.5 mL/hr at 09/06/21 0537 3.375 g at 09/06/21 0537  ? polyethylene glycol (MIRALAX / GLYCOLAX) packet 17 g  17 g Oral Daily Nicole Kindred A, DO   17 g at 09/04/21 1741  ? sodium chloride flush (NS) 0.9 % injection 10-40 mL  10-40 mL Intracatheter Q12H Nicole Kindred A, DO   10 mL at 09/06/21 7622  ? sodium chloride flush (NS) 0.9 % injection 10-40 mL  10-40 mL Intracatheter PRN Nicole Kindred A, DO      ? sodium chloride flush (NS) 0.9 % injection 5 mL  5 mL Intracatheter Q8H Greggory Keen, MD   5 mL at 09/05/21 1311  ? TPN ADULT (ION)   Intravenous Continuous TPN RauerForde Dandy, RPH 90 mL/hr at 09/05/21 1712 New Bag at 09/05/21 1712  ? TPN ADULT (ION)   Intravenous Continuous TPN Rauer, Forde Dandy, RPH      ? ? ?VITAL SIGNS: ?BP (!) 149/87 (BP Location: Left Arm)   Pulse 80   Temp 98.6 ?F (37 ?C)  (Oral)   Resp 18   Ht 5' 3.5" (1.613 m)   Wt 131 lb 9.8 oz (59.7 kg)   SpO2 98%   BMI 22.95 kg/m?  ?Filed Weights  ? 08/29/21 1706 09/03/21 1200  ?Weight: 141 lb (64 kg) 131 lb 9.8 oz (59.7 kg)  ?  ?Estimated body mass index is 22.95 kg/m? as calculated from the following: ?  Height as of this encounter: 5' 3.5" (1.613 m). ?  Weight as of this encounter: 131 lb 9.8 oz (59.7 kg). ? ?  LABS: ?CBC: ?   ?Component Value Date/Time  ? WBC 10.4 09/02/2021 0347  ? HGB 14.3 09/02/2021 0347  ? HGB 15.4 12/01/2015 0829  ? HCT 42.3 09/02/2021 0347  ? HCT 43.6 12/01/2015 0829  ? PLT 274 09/02/2021 0347  ? PLT 258 12/01/2015 0829  ? MCV 88.3 09/02/2021 0347  ? MCV 85 12/01/2015 0829  ? NEUTROABS 8.5 (H) 08/29/2021 1713  ? NEUTROABS 5.0 12/01/2015 0829  ? LYMPHSABS 1.3 08/29/2021 1713  ? LYMPHSABS 2.4 12/01/2015 0829  ? MONOABS 0.6 08/29/2021 1713  ? EOSABS 0.0 08/29/2021 1713  ? EOSABS 0.1 12/01/2015 0829  ? BASOSABS 0.1 08/29/2021 1713  ? BASOSABS 0.0 12/01/2015 0829  ? ?Comprehensive Metabolic Panel: ?   ?Component Value Date/Time  ? NA 138 09/06/2021 0546  ? NA 140 04/20/2021 0833  ? K 4.1 09/06/2021 0546  ? CL 106 09/06/2021 0546  ? CO2 23 09/06/2021 0546  ? BUN 22 09/06/2021 0546  ? BUN 13 04/20/2021 0833  ? CREATININE 0.67 09/06/2021 0546  ? GLUCOSE 289 (H) 09/06/2021 0546  ? CALCIUM 9.1 09/06/2021 0546  ? AST 38 09/06/2021 0546  ? ALT 86 (H) 09/06/2021 0546  ? ALKPHOS 87 09/06/2021 0546  ? BILITOT 2.7 (H) 09/06/2021 0546  ? BILITOT 0.5 04/20/2021 0833  ? PROT 6.7 09/06/2021 0546  ? PROT 7.0 04/20/2021 0833  ? ALBUMIN 3.2 (L) 09/06/2021 0546  ? ALBUMIN 4.9 (H) 04/20/2021 1610  ? ? ?RADIOGRAPHIC STUDIES: ?DG Abd 1 View ? ?Result Date: 09/05/2021 ?CLINICAL DATA:  Follow up to assess clearing of contrast from stomach- 4pm timed image EXAM: ABDOMEN - 1 VIEW COMPARISON:  None. FINDINGS: Gastrostomy tube with tip overlying the mid abdomen. Slightly decreased but persistent PO contrast partially opacifying the gastric lumen. PO  contrast also noted throughout the colon. No extravasation of PO contrast noted. The bowel gas pattern is normal. Surgical clips overlie the right upper abdomen. IMPRESSION: 1. Gastrostomy tube in good position. 2. Slightly decrea

## 2021-09-06 NOTE — Progress Notes (Signed)
Chaplain Maggie offered Advanced Directives education at bedside. Contact on call chaplain or coordinate notary and witnesses. ?

## 2021-09-06 NOTE — TOC Progression Note (Signed)
Transition of Care (TOC) - Progression Note  ? ? ?Patient Details  ?Name: TZIPORA MCINROY ?MRN: [id number] ?Date of Birth: 1955-08-02 ? ?Transition of Care (TOC) CM/SW Contact  ?Beverly Sessions, RN ?Phone Number: ?09/06/2021, 10:06 AM ? ?Clinical Narrative:    ? ?Reached out to MD and surgery to determine if it was anticipated that patient would need TPN at discharge  ? ?  ?  ? ?Expected Discharge Plan and Services ?  ?  ?  ?  ?  ?                ?  ?  ?  ?  ?  ?  ?  ?  ?  ?  ? ? ?Social Determinants of Health (SDOH) Interventions ?  ? ?Readmission Risk Interventions ?   ? View : No data to display.  ?  ?  ?  ? ? ?

## 2021-09-06 NOTE — Progress Notes (Signed)
Met patient as an inpatient.  Navigation initiated.  Coordinated left breast biopsy with Dr.Rodenberg today for clinical workup per Dr. Elroy Channel request. Reviewed services/ support provided through the [facility] for patient.  Given Breast Cancer Treatment Handbook/packet.  States she is trying  clear liquids  and Ensure today.   Reports possible discharge in next couple of days. ?

## 2021-09-06 NOTE — Consult Note (Addendum)
PHARMACY - TOTAL PARENTERAL NUTRITION CONSULT NOTE  ? ?Indication: Small bowel obstruction ? ?Patient Measurements: ?Height: 5' 3.5" (161.3 cm) ?Weight: 59.7 kg (131 lb 9.8 oz) ?IBW/kg (Calculated) : 53.55 ?TPN AdjBW (KG): 64 ?Body mass index is 22.95 kg/m?. ?Usual Weight: 64 kg ? ?Assessment:  ?66 years old female with PMH significant for diabetes, hypertension, hypothyroidism, prior cholecystectomy sent to [facility] ED 08/29/21 from PCP due to elevated liver enzymes. 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 Patient intolerant to NG tube with increased emesis ? ?Glucose / Insulin: BG 271-289; Semglee 10u + 0-15u Q6H; required 29 u insulin past 24 hrs ?Electrolytes: WNL ?Renal: BUN 29 ?Hepatic: AST 73, ALT 122, T Bili 3.9, Albumin 3.0, Protein 6.2 ?Intake / Output; MIVF: +961 mL ?GI Imaging: ?3/20: Suspected masslike duodenal wall thickening extending to the ampulla, worrisome for duodenal/ampullary carcinoma ? ?3/27: Percutaneous internal/external biliary drain  positioning is not significantly changed  ?GI Surgeries / Procedures:  ?3/21: Endoscopy ?3/22: external biliary drain placement  ? ? ?Central access: PICC line planned 09/03/21 ?TPN start date: 09/03/21 ? ?Nutritional Goals: ?Goal TPN rate is 90 mL/hr (provides 104 g of protein and 2186 kcals per day) ? ?RD Assessment: ?Estimated Needs ?Total Energy Estimated Needs: 2000-2200 kcal ?Total Protein Estimated Needs: 100-110 grams ?Total Fluid Estimated Needs: >/= 2 L/day ? ?Current Nutrition:  ?NPO ? ?Plan:  ?Continue TPN at goal rate of 90 mL/hr at 1800 ?Electrolytes in TPN: Na 28mq/L, K 5674m/L, Ca 74m11mL, Mg 74mE19m, and Phos wnl, will adjust Phos back to 20 mmol/L. Cl:Ac 1:1 ?Add standard MVI to TPN ?Reduce trace elements to 1/2 due to elevated t bili and jaundice ?Add thiamine 100 mg to TPN x 5 days (day 4/5) ?Increase insulin glargine to 15 u daily; consider adding insulin to  TPN ?Initiate Moderate q6h SSI and adjust as needed  ?Monitor TPN labs on Mon/Thurs, daily until stable ? ?SamaSherilyn BankerarmD ?Clinical Pharmacist ?09/06/2021 ?7:02 AM ? ? ?

## 2021-09-06 NOTE — Telephone Encounter (Signed)
Patient due to gastric obstruction issues, patient would have a hard time processing the ribociclib tablet. Currently there is no available information on dissolving the ribociclib tablet. Dr. Janese Banks plans to switch to abemciclib which has information in dissolving tablets for administration.  ?

## 2021-09-07 ENCOUNTER — Telehealth: Payer: Self-pay

## 2021-09-07 ENCOUNTER — Other Ambulatory Visit: Payer: Self-pay | Admitting: Oncology

## 2021-09-07 DIAGNOSIS — K831 Obstruction of bile duct: Secondary | ICD-10-CM | POA: Diagnosis not present

## 2021-09-07 DIAGNOSIS — C50919 Malignant neoplasm of unspecified site of unspecified female breast: Secondary | ICD-10-CM

## 2021-09-07 LAB — SURGICAL PATHOLOGY

## 2021-09-07 LAB — GLUCOSE, CAPILLARY
Glucose-Capillary: 325 mg/dL — ABNORMAL HIGH (ref 70–99)
Glucose-Capillary: 333 mg/dL — ABNORMAL HIGH (ref 70–99)
Glucose-Capillary: 358 mg/dL — ABNORMAL HIGH (ref 70–99)
Glucose-Capillary: 359 mg/dL — ABNORMAL HIGH (ref 70–99)
Glucose-Capillary: 360 mg/dL — ABNORMAL HIGH (ref 70–99)
Glucose-Capillary: 361 mg/dL — ABNORMAL HIGH (ref 70–99)

## 2021-09-07 LAB — BASIC METABOLIC PANEL
Anion gap: 10 (ref 5–15)
BUN: 21 mg/dL (ref 8–23)
CO2: 28 mmol/L (ref 22–32)
Calcium: 10.1 mg/dL (ref 8.9–10.3)
Chloride: 95 mmol/L — ABNORMAL LOW (ref 98–111)
Creatinine, Ser: 0.51 mg/dL (ref 0.44–1.00)
GFR, Estimated: >60 mL/min (ref >60)
Glucose, Bld: 337 mg/dL — ABNORMAL HIGH (ref 70–99)
Potassium: 4.2 mmol/L (ref 3.5–5.1)
Sodium: 133 mmol/L — ABNORMAL LOW (ref 135–145)

## 2021-09-07 LAB — MAGNESIUM: Magnesium: 2.3 mg/dL (ref 1.7–2.4)

## 2021-09-07 LAB — PHOSPHORUS: Phosphorus: 3.9 mg/dL (ref 2.5–4.6)

## 2021-09-07 MED ORDER — SCOPOLAMINE 1 MG/3DAYS TD PT72
1.0000 | MEDICATED_PATCH | TRANSDERMAL | Status: DC
  Administered 2021-09-07 – 2021-09-13 (×3): 1.5 mg via TRANSDERMAL
  Filled 2021-09-07 (×3): qty 1

## 2021-09-07 MED ORDER — INSULIN GLARGINE-YFGN 100 UNIT/ML ~~LOC~~ SOLN: 5.0000 [IU] | Freq: Once | SUBCUTANEOUS | Status: DC

## 2021-09-07 MED ORDER — INSULIN GLARGINE-YFGN 100 UNIT/ML ~~LOC~~ SOLN: 10.0000 [IU] | Freq: Every day | SUBCUTANEOUS | Status: DC

## 2021-09-07 MED ORDER — PANTOPRAZOLE SODIUM 40 MG PO TBEC
40.0000 mg | DELAYED_RELEASE_TABLET | Freq: Every day | ORAL | Status: DC
  Administered 2021-09-07: 40 mg via ORAL
  Filled 2021-09-07: qty 1

## 2021-09-07 MED ORDER — INSULIN GLARGINE-YFGN 100 UNIT/ML ~~LOC~~ SOLN
30.0000 [IU] | Freq: Every day | SUBCUTANEOUS | Status: DC
  Administered 2021-09-07: 30 [IU] via SUBCUTANEOUS
  Filled 2021-09-07 (×2): qty 0.3

## 2021-09-07 MED ORDER — LIVING WELL WITH DIABETES BOOK
Freq: Once | Status: AC
  Filled 2021-09-07: qty 1

## 2021-09-07 MED ORDER — INSULIN STARTER KIT- PEN NEEDLES (ENGLISH)
1.0000 | Freq: Once | Status: AC
  Administered 2021-09-07: 1
  Filled 2021-09-07: qty 1

## 2021-09-07 MED ORDER — TRAVASOL 10 % IV SOLN
INTRAVENOUS | Status: AC
  Filled 2021-09-07: qty 1036.8

## 2021-09-07 MED ORDER — PANTOPRAZOLE SODIUM 40 MG PO TBEC: 40.0000 mg | DELAYED_RELEASE_TABLET | Freq: Two times a day (BID) | ORAL | Status: DC

## 2021-09-07 MED ORDER — ALUM & MAG HYDROXIDE-SIMETH 200-200-20 MG/5ML PO SUSP
30.0000 mL | Freq: Once | ORAL | Status: AC
  Administered 2021-09-07: 30 mL via ORAL
  Filled 2021-09-07: qty 30

## 2021-09-07 MED ORDER — PANTOPRAZOLE SODIUM 40 MG IV SOLR
40.0000 mg | Freq: Two times a day (BID) | INTRAVENOUS | Status: DC
  Administered 2021-09-07 – 2021-09-13 (×13): 40 mg via INTRAVENOUS
  Filled 2021-09-07 (×14): qty 10

## 2021-09-07 NOTE — Progress Notes (Addendum)
Inpatient Diabetes Program Recommendations ? ?AACE/ADA: New Consensus Statement on Inpatient Glycemic Control  ?Target Ranges:  Prepandial:   less than 140 mg/dL ?     Peak postprandial:   less than 180 mg/dL (1-2 hours) ?     Critically ill patients:  140 - 180 mg/dL  ? ? Latest Reference Range & Units 09/06/21 05:27 09/06/21 08:27 09/06/21 ?9:10 09/06/21 11:45 09/06/21 17:47 09/06/21 23:57 09/07/21 05:39  ?Glucose-Capillary 70 - 99 mg/dL 271 (H) ? ?Novolog 8 units 271 (H)  ? ? ? ?Semglee 15 units 314 (H) ? ?Novolog 11 units 303 (H) ? ?Novolog 11 units 333 (H) ? ?Novolog 11 units 358 (H) ? ?Novolog 15 units  ? ? Latest Reference Range & Units 04/20/21 08:33  ?Hemoglobin A1C 4.8 - 5.6 % 6.5 (H)  ?(H): Data is abnormally high ?Review of Glycemic Control ? ?Diabetes history: DM2 ?Outpatient Diabetes medications: None ?Current orders for Inpatient glycemic control: Novolog 0-15 units Q6H, Semglee 25 units daily; TPN @ 90 ml/hr ? ?Inpatient Diabetes Program Recommendations:   ? ?Insulin: Please consider adding Regular 50 units to TPN if continued at 90 ml/hr.  ? ?NOTE: CBGs have ranged from 271-358 mg/dl over the past 24 hours and patient has received a total of Novolog 56 units for correction. Noted Semglee increased from 15 to 25 units today. Recommend adding insulin to TPN which is safer than increasing SQ insulin. That way if TPN is stopped or decreased the patient does not have large amounts of SQ insulin on board.  ? ?Addendum 09/07/21_0 :45-Spoke with patient at bedside regarding DM control and potentially discharging on insulin. Patient reports that she was dx with DM last year (had pre-DM prior to that). She states she started taking Metformin in October 2022 and she was told to stop the Metformin in January due to health concerns. She states she tried to restart the Metformin in February but due to GI issues she was not able to. Patient reports she has a family hx of DM and her sisters take DM meds (one takes  oral DM meds and the other takes insulin). Patient reports that she has been checking glucose and it was running in the mid 140-150's mg/dl at home fasting and she knew she needed to be taking medication for DM. Patient reports her last A1C was 6.5% in November 2022. Discussed glucose and A1C goals. Discussed importance of checking CBGs and maintaining good CBG control to prevent long-term and short-term complications.  Discussed impact of nutrition, exercise, stress, sickness, and medications on diabetes control.  Discussed impact of TPN on glucose as it is contributing to hyperglycemia significantly. Discussed Semglee and Novolog insulin and how each insulin works. Explained that she will likely need to be discharged on insulin. Patient states she would be agreeable to take insulin outpatient. Reviewed steps and demonstrated how to use vial/syringe and insulin pens. Patient was able to successfully demonstrate how to use vial/syringe and insulin pens. Patient stated that she would prefer to use vial/syringe. Discussed insulin storage, insulin injection sites, importance of rotating injection sites, disposal of sharps. Informed patient I ordered a Living Well with DM book and an insulin starter kit which nurse should provide once pharmacy brings items to unit. Encouraged patient to read information once received. Reviewed hyperglycemia, hypoglycemia, along with treatment for both. Given patient's limited PO intake, encouraged patient to get glucose gel to use in case she experiences hypoglycemia. Also encouraged patient to be sure to reach out to her PCP  if she experiences any hypoglycemia in case insulin needs to be adjusted.  Discussed that if TPN is able to be tapered, her insulin needs may change.  Discussed FreeStyle Libre2 CGM and how it works. Explained that a CGM would provide more information on glucose trends but also able to alert her if glucose is less than 70 or over 240 mg/dl (factory default alarm  settings). Patient states she would be interested in using [facility] for glucose monitoring. Will ask attending provider for permission to provide samples of FreeStyle Libre2 CGM reader and sensors.  Will ask nursing to allow patient to self administer insulin injections while inpatient. Patient verbalized understanding of information and has no questions at this time related to DM.  ? ?Addendum 09/07/21_0 :00-Talked with Dr. Kurtis Bushman regarding discharge plan for DM control. Dr. Kurtis Bushman provided verbal order to provide patient with FreeStyle LIbre2 CGM reader and sensors on 09/08/21. Plan will be for patient to be discharged on insulin since TPN will be continued outpatient. Patient prefers using vial/syringe for insulin injections.  At time of discharge, please provide Rx for Lantus vial (#30080), Novolog vial (#[id number]), insulin syringes (#34196), lancets (#22297), lancet device (#98921), and FreeStyle Libre2 sensors [phone]). ? ?Thanks, ?Diamond Alderman, RN, MSN, CDE ?Diabetes Coordinator ?Inpatient Diabetes Program ?[phone] (Team Pager from 8am to 5pm) ? ?

## 2021-09-07 NOTE — TOC Progression Note (Signed)
Transition of Care (TOC) - Progression Note  ? ? ?Patient Details  ?Name: Diamond Hinton ?MRN: [id number] ?Date of Birth: Jul 06, 1955 ? ?Transition of Care (TOC) CM/SW Contact  ?Beverly Sessions, RN ?Phone Number: ?09/07/2021, 10:00 AM ? ?Clinical Narrative:    ? ?Pam with Advanced infusion completed education at bedside yesterday with daughter and patient.  Will complete second session of education today.  Anticipated DC tomorrow  ? ?  ?  ? ?Expected Discharge Plan and Services ?  ?  ?  ?  ?  ?                ?  ?  ?  ?  ?  ?  ?  ?  ?  ?  ? ? ?Social Determinants of Health (SDOH) Interventions ?  ? ?Readmission Risk Interventions ?   ? View : No data to display.  ?  ?  ?  ? ? ?

## 2021-09-07 NOTE — Progress Notes (Signed)
?PROGRESS NOTE ? ? ? ?Diamond Hinton  QPY:[id number] DOB: 1955-12-13 DOA: 08/29/2021 ?PCP: Virginia Crews, MD  ? ? ?Brief Narrative:  ?This 66 years old female with PMH significant for diabetes, hypertension, hypothyroidism, prior cholecystectomy sent by her PCP with abnormal labs notably elevated liver enzymes.  Patient reports having gastric reflux symptoms for about a month and has been receiving treatment with famotidine, sucralfate but her symptoms had not improved.  Work-up in the ED shows elevated AST ALT and total bilirubin, lipase 395, UA: Large LE.  CT abdomen and pelvis shows marked distention of the stomach finding suggestive of possible benign or malignant stricture.  Endoscopic evaluation recommended.  Left breast mass with slightly spiculated margins consider outpatient mammogram.   ?  ?GI was consulted.  They attempted ERCP on 3/21, but unable to proceed due to food and liquid residue in the stomach.  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   ?  ?IR consulted for biliary drain placement on 3/22, but unable to advance drain past sphincter of Oddi. ?Returned to IR on 3/24 for advancement of drain.   ?  ?Preliminary biopsy results showing this is likely metastatic breast cancer.  Surgery consulted for consideration of a J tube for nutrition.  Oncology also consulted. ? ? ? ?Consultants:  ?Oncology surgery ? ?Procedures:  ? ?Antimicrobials:  ?  ? ? ?Subjective: ?Nauseous this AM.  No vomiting. ? ?Objective: ?Vitals:  ? 09/06/21 1536 09/06/21 1948 09/07/21 0343 09/07/21 0834  ?BP: (!) 153/85 (!) 148/91 (!) 143/95 121/83  ?Pulse: 80 89 95 99  ?Resp: '18 20 20 18  '$ ?Temp: 98.8 ?F (37.1 ?C) 97.9 ?F (36.6 ?C) 97.9 ?F (36.6 ?C)   ?TempSrc: Oral Oral Oral   ?SpO2: 98% 96% 97% 98%  ?Weight:      ?Height:      ? ? ?Intake/Output Summary (Last 24 hours) at 09/07/2021 1447 ?Last data filed at 09/07/2021 1428 ?Gross per 24 hour  ?Intake 841.61 ml  ?Output 1715 ml  ?Net -873.39 ml   ? ?Filed Weights  ? 08/29/21 1706 09/03/21 1200  ?Weight: 64 kg 59.7 kg  ? ? ?Examination: ?Calm, NAD ?Cta no w/r ?Reg s1/s2 no gallop ?Soft benign +bs ?No edema ?Aaoxox3  ?Mood and affect appropriate in current setting  ? ? ? ?Data Reviewed: I have personally reviewed following labs and imaging studies ? ?CBC: ?Recent Labs  ?Lab 09/01/21 ?0411 09/02/21 ?1245  ?WBC 11.1* 10.4  ?HGB 13.8 14.3  ?HCT 41.4 42.3  ?MCV 89.2 88.3  ?PLT 260 274  ? ?Basic Metabolic Panel: ?Recent Labs  ?Lab 09/01/21 ?0411 09/02/21 ?8099 09/03/21 ?8338 09/04/21 ?2505 09/05/21 ?3976 09/06/21 ?7341 09/07/21 ?0340  ?NA 140   < > 142 141 135 138 133*  ?K 3.9   < > 3.7 3.7 3.7 4.1 4.2  ?CL 109   < > 104 107 103 106 95*  ?CO2 23   < > '29 28 23 23 28  '$ ?GLUCOSE 141*   < > 109* 221* 241* 289* 337*  ?BUN 26*   < > 29* 30* '22 22 21  '$ ?CREATININE 0.74   < > 0.77 0.64 0.55 0.67 0.51  ?CALCIUM 8.8*   < > 8.7* 8.4* 8.6* 9.1 10.1  ?MG 2.3  --   --  2.5* 2.2 2.1 2.3  ?PHOS  --   --  2.9 2.4* 2.1* 3.3 3.9  ? < > = values in this interval not displayed.  ? ?  GFR: ?Estimated Creatinine Clearance: 57.7 mL/min (by C-G formula based on SCr of 0.51 mg/dL). ?Liver Function Tests: ?Recent Labs  ?Lab 09/02/21 ?1093 09/03/21 ?2355 09/04/21 ?7322 09/05/21 ?0254 09/06/21 ?2706  ?AST 73* 100* 73* 54* 38  ?ALT 118* 149* 122* 103* 86*  ?ALKPHOS 142* 125 99 94 87  ?BILITOT 5.0* 4.9* 3.9* 3.2* 2.7*  ?PROT 6.7 6.4* 6.2* 6.3* 6.7  ?ALBUMIN 3.1* 3.1* 3.0* 2.9* 3.2*  ? ?No results for input(s): LIPASE, AMYLASE in the last 168 hours. ?No results for input(s): AMMONIA in the last 168 hours. ?Coagulation Profile: ?No results for input(s): INR, PROTIME in the last 168 hours. ?Cardiac Enzymes: ?No results for input(s): CKTOTAL, CKMB, CKMBINDEX, TROPONINI in the last 168 hours. ?BNP (last 3 results) ?No results for input(s): PROBNP in the last 8760 hours. ?HbA1C: ?No results for input(s): HGBA1C in the last 72 hours. ?CBG: ?Recent Labs  ?Lab 09/06/21 ?1747 09/06/21 ?2357 09/07/21 ?2376  09/07/21 ?2831 09/07/21 ?1141  ?GLUCAP 303* 333* 358* 359* 361*  ? ?Lipid Profile: ?Recent Labs  ?  09/05/21 ?0635  ?TRIG 159*  ? ?Thyroid Function Tests: ?No results for input(s): TSH, T4TOTAL, FREET4, T3FREE, THYROIDAB in the last 72 hours. ?Anemia Panel: ?No results for input(s): VITAMINB12, FOLATE, FERRITIN, TIBC, IRON, RETICCTPCT in the last 72 hours. ?Sepsis Labs: ?No results for input(s): PROCALCITON, LATICACIDVEN in the last 168 hours. ? ?Recent Results (from the past 240 hour(s))  ?Aerobic/Anaerobic Culture w Gram Stain (surgical/deep wound)     Status: None  ? Collection Time: 08/31/21  2:31 PM  ? Specimen: BILE  ?Result Value Ref Range Status  ? Specimen Description   Final  ?  BILE ?Performed at [facility], [address], [place], [place] [address] ?  ? Special Requests   Final  ?  NONE ?Performed at [facility], [address] [place], [place] [address] ?  ? Gram Stain NO WBC SEEN ?NO ORGANISMS SEEN ?  Final  ? Culture   Final  ?  No growth aerobically or anaerobically. ?Performed at [facility], [place] [address], [place], [place] [address] ?  ? Report Status 09/05/2021 FINAL  Final  ?  ? ? ? ? ? ?Radiology Studies: ?DG Abd 1 View ? ?Result Date: 09/05/2021 ?CLINICAL DATA:  Follow up to assess clearing of contrast from stomach- 4pm timed image EXAM: ABDOMEN - 1 VIEW COMPARISON:  None. FINDINGS: Gastrostomy tube with tip overlying the mid abdomen. Slightly decreased but persistent PO contrast partially opacifying the gastric lumen. PO contrast also noted throughout the colon. No extravasation of PO contrast noted. The bowel gas pattern is normal. Surgical clips overlie the right upper abdomen. IMPRESSION: 1. Gastrostomy tube in good position. 2. Slightly decreased but persistent PO contrast partially opacifying the gastric lumen. 3.  PO contrast also noted throughout the colon. Electronically Signed   By: Iven Finn M.D.   On: 09/05/2021 16:24   ? ? ? ? ? ?Scheduled  Meds: ? Chlorhexidine Gluconate Cloth  6 each Topical Daily  ? feeding supplement  237 mL Oral BID BM  ? hydrochlorothiazide  25 mg Oral Daily  ? insulin aspart  0-15 Units Subcutaneous Q6H  ? insulin glargine-yfgn  30 Units Subcutaneous Daily  ? insulin glargine-yfgn  5 Units Subcutaneous Once  ? levothyroxine  75 mcg Oral Q0600  ? lidocaine-EPINEPHrine  10 mL Intradermal Once  ? lidocaine-EPINEPHrine  1.7 mL Intradermal Once  ? pantoprazole  40 mg Oral Daily  ?  polyethylene glycol  17 g Oral Daily  ? scopolamine  1 patch Transdermal Q72H  ? sodium chloride flush  10-40 mL Intracatheter Q12H  ? sodium chloride flush  5 mL Intracatheter Q8H  ? ?Continuous Infusions: ? sodium chloride 10 mL/hr at 09/05/21 0256  ? TPN ADULT (ION) 90 mL/hr at 09/07/21 0349  ? TPN ADULT (ION)    ? ? ?Assessment & Plan: ?  ?Principal Problem: ?  Obstructive jaundice ?Active Problems: ?  Metastatic breast cancer ([place]) ?  Gastric outlet obstruction ?  Elevated lipase ?  Abnormal LFTs ?  UTI (urinary tract infection) ?  History of cholecystectomy ?  Uncontrolled type 2 diabetes mellitus with hyperglycemia, without long-term current use of insulin ([place]) ?  Adult hypothyroidism ?  Hypertension ?  Hypokalemia ?  Stenosis of duodenum ?  Constipation ?  Hypophosphatemia ?  Goals of care, counseling/discussion ?  Palliative care encounter ? ?Obstructive jaundice ?Presented with jaundice, with history of prior cholecystectomy, with elevated LFTs, lipase. ?GI was consulted and recommended ERCP. ?ERCP was attempted on 3/21 but unable to be performed due to food and liquid residue within the stomach.  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 ?3/29Surgery consulted.  ?No emergent surgical intervention ?TPN -anticipate long-term need for TPN ? ? ? ?Gastric outlet obstruction ?Due to metastatic breast cancer. ?MRCP on 3/21 showed masslike duodenal wall thickening extending to the ampulla, worrisome for  duodenal/ampullary carcinoma. ...intrahepatic/extrahepatic ductal dilatation.... prominent pancreatic duct, extending to the ampulla... 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   ?-

## 2021-09-07 NOTE — Care Management Important Message (Signed)
Important Message ? ?Patient Details  ?Name: Diamond Hinton ?MRN: [id number] ?Date of Birth: Oct 30, 1955 ? ? ?Medicare Important Message Given:  Yes ? ? ? ? ?Dannette Barbara ?09/07/2021, 11:18 AM ?

## 2021-09-07 NOTE — Progress Notes (Signed)
Chaplain Maggie made follow up visit with patient. A prayer shawl was shared as a sign/symbol of God's presence and of things transcendent. A book of psalms was shared highlighting the psalms of lament to help connect with the human experience of anguish and feelings of being forsaken. Pt was appreciative and receptive to visit. Chaplain also introduced pt to the concept of the "dark night of the soul" which touches on experience of loneliness. Continued support available per on call chaplain. ?

## 2021-09-07 NOTE — Progress Notes (Addendum)
[facility] ?SURGICAL PROGRESS NOTE (cpt [id number]) ? ?Hospital Day(s): 9. ? ?Interval History: Patient seen and examined, no acute events or new complaints overnight. Patient reports she had a rough night secondary to reflux. She reports that she had bad reflux that resulted in emesis. She tried TUMS overnight but that did not help much. Now on PPI. No abdominal pain, fever, chills. Biliary drain with 2.0L ccs out. She is currently on CLD with TPN. Breast Biopsy from 03/28 is pending ? ?Review of Systems:  ?Constitutional: denies fever, chills  ?HEENT: denies cough or congestion  ?Respiratory: denies any shortness of breath  ?Cardiovascular: denies chest pain or palpitations  ?Gastrointestinal: denies abdominal pain, N/V ?Genitourinary: denies burning with urination or urinary frequency ?Musculoskeletal: denies pain, decreased motor or sensation ? ?Vital signs in last 24 hours: [min-max] current  ?Temp:  [97.9 ?F (36.6 ?C)-98.8 ?F (37.1 ?C)] 97.9 ?F (36.6 ?C) (03/29 0343) ?Pulse Rate:  [80-95] 95 (03/29 0343) ?Resp:  [18-20] 20 (03/29 0343) ?BP: (143-153)/(85-95) 143/95 (03/29 0343) ?SpO2:  [96 %-98 %] 97 % (03/29 0343)     Height: 5' 3.5" (161.3 cm) Weight: 59.7 kg BMI (Calculated): 22.95  ? ?Intake/Output last 2 shifts:  ?03/28 0701 - 03/29 0700 ?In: 1434.1 [P.O.:300; I.V.:1134.1] ?Out: 2090 [Drains:2090]  ? ?Physical Exam:  ?Constitutional: alert, cooperative and no distress  ?HENT: normocephalic without obvious abnormality ?Eyes: PERRL, EOM's grossly intact and symmetric  ?Respiratory: breathing non-labored at rest  ?Cardiovascular: regular rate and sinus rhythm  ?Gastrointestinal: soft, non-tender, and non-distended, no rebound/guarding. Biliary drain present; output remains bilious ?Musculoskeletal: no edema or wounds, motor and sensation grossly intact, NT  ? ? ?Labs:  ? ?  Latest Ref Rng & Units 09/02/2021  ?  3:47 AM 09/01/2021  ?  4:11 AM 08/31/2021  ?  4:26 AM  ?CBC  ?WBC 4.0 - 10.5 K/uL 10.4    11.1   9.3    ?Hemoglobin 12.0 - 15.0 g/dL 14.3   13.8   13.4    ?Hematocrit 36.0 - 46.0 % 42.3   41.4   39.6    ?Platelets 150 - 400 K/uL 274   260   241    ? ? ?  Latest Ref Rng & Units 09/07/2021  ?  3:40 AM 09/06/2021  ?  5:46 AM 09/05/2021  ?  6:35 AM  ?CMP  ?Glucose 70 - 99 mg/dL 337   289   241    ?BUN 8 - 23 mg/dL '21   22   22    '$ ?Creatinine 0.44 - 1.00 mg/dL 0.51   0.67   0.55    ?Sodium 135 - 145 mmol/L 133   138   135    ?Potassium 3.5 - 5.1 mmol/L 4.2   4.1   3.7    ?Chloride 98 - 111 mmol/L 95   106   103    ?CO2 22 - 32 mmol/L '28   23   23    '$ ?Calcium 8.9 - 10.3 mg/dL 10.1   9.1   8.6    ?Total Protein 6.5 - 8.1 g/dL  6.7   6.3    ?Total Bilirubin 0.3 - 1.2 mg/dL  2.7   3.2    ?Alkaline Phos 38 - 126 U/L  87   94    ?AST 15 - 41 U/L  38   54    ?ALT 0 - 44 U/L  86   103    ? ? ?Imaging studies: No  new pertinent imaging studies ? ? ?Assessment/Plan: (ICD-10's: K31.1) ?66 y.o. female with GOO and (improving) obstructive juandice of malignant etiology thought to be possible GI primary vs metastatic left breast mass.  ? ? - No emergent surgical intervention. We will continue to assess her tolerance of liquid diet. At most, we could offer palliative diversion (ex: gastrojejunostomy). I am worried that her reflux and emesis overnight could continue to represent inability to tolerate PO, even CLD. I discussed palliative options of diversion again with her just briefly. This is certainly not emergent and we likely will not have to pursue it as inpatient unless she significantly regresses  ? ? - Continue CLD + nutritional supplementation for now. Would not advance further ? - Agree with PPI for reflux ? - Continue TPN; monitor electrolytes - She will certainly need to go home with TPN as she will not be able to tolerate any significant PO intake and unable to meet her nutritional needs without TPN. Tube feedings via NGT or gastrostomy tube are not feasible again given her near complete duodenal obstruction.  ? -  Monitor abdominal examination; on-going bowel function  ? 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 ?All of the above findings and recommendations were discussed with the patient, and the medical team, and all of patient's questions were answered to her expressed satisfaction. ? ?-- ?Edison Simon, PA-C ?[facility] ?09/07/2021, 7:22 AM ?[phone] ?M-F: 7am - 4pm ? ?

## 2021-09-07 NOTE — Progress Notes (Signed)
START ON PATHWAY REGIMEN - Breast ? ? ?  Cycle 1: A cycle is 28 days: ?    Abemaciclib  ?    Fulvestrant  ?  Cycles 2 and beyond: A cycle is every 28 days: ?    Abemaciclib  ?    Fulvestrant  ? ?**Always confirm dose/schedule in your pharmacy ordering system** ? ?Patient Characteristics: ?Distant Metastases or Locoregional Recurrent Disease - Unresected or Locally Advanced Unresectable Disease Progressing after Neoadjuvant and Local Therapies, HER2 Low/Negative/Unknown, ER Positive, Endocrine Therapy, Postmenopausal, First Line, No Prior  ?Endocrine Therapy Adjuvantly, Candidate for a CDK4/6 Inhibitor ?Therapeutic Status: Distant Metastases ?HER2 Status: Negative (-) ?ER Status: Positive (+) ?PR Status: Negative (-) ?Therapy Approach Indicated: Standard Chemotherapy/Endocrine Therapy ?Menopausal Status: Postmenopausal ?Line of Therapy: First Line ?Previous Therapy: No Prior Endocrine Therapy Adjuvantly ?Intent of Therapy: ?Non-Curative / Palliative Intent, Discussed with Patient ?

## 2021-09-07 NOTE — Consult Note (Signed)
PHARMACY - TOTAL PARENTERAL NUTRITION CONSULT NOTE  ? ?Indication: Small bowel obstruction ? ?Patient Measurements: ?Height: 5' 3.5" (161.3 cm) ?Weight: 59.7 kg (131 lb 9.8 oz) ?IBW/kg (Calculated) : 53.55 ?TPN AdjBW (KG): 64 ?Body mass index is 22.95 kg/m?. ?Usual Weight: 64 kg ? ?Assessment:  ?66 years old female with PMH significant for diabetes, hypertension, hypothyroidism, prior cholecystectomy sent to [facility] ED 08/29/21 from PCP due to elevated liver enzymes. 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 Patient intolerant to NG tube with increased emesis ? ?Glucose / Insulin: BG 337-358; Semglee 15u + 0-15u Q6H; required 48 u insulin past 24 hrs ?Electrolytes: Na 133, trending down ?Renal: BUN 29 ?Hepatic: AST 73, ALT 122, T Bili 3.9, Albumin 3.0, Protein 6.2 ?Intake / Output; MIVF: +961 mL ?GI Imaging: ?3/20: Suspected masslike duodenal wall thickening extending to the ampulla, worrisome for duodenal/ampullary carcinoma ? ?3/27: Percutaneous internal/external biliary drain  positioning is not significantly changed  ?GI Surgeries / Procedures:  ?3/21: Endoscopy ?3/22: external biliary drain placement  ? ? ?Central access: PICC line planned 09/03/21 ?TPN start date: 09/03/21 ? ?Nutritional Goals: ?Goal TPN rate is 90 mL/hr (provides 104 g of protein and 2186 kcals per day) ? ?RD Assessment: ?Estimated Needs ?Total Energy Estimated Needs: 2000-2200 kcal ?Total Protein Estimated Needs: 100-110 grams ?Total Fluid Estimated Needs: >/= 2 L/day ? ?Current Nutrition:  ?NPO ? ?Plan:  ?Continue TPN at goal rate of 90 mL/hr at 1800. Per surg, pt likely to go home on TPN.  ?Electrolytes in TPN: Na 23mq/L, K 549m/L, Ca 55m82mL, Mg 55mE31m, and Phos wnl and trending up will adjust Phos back to standard 15 mmol/L. Cl:Ac 1:1 ?Add standard MVI to TPN ?Reduce trace elements to 1/2 due to elevated t bili and jaundice ?Add thiamine 100 mg to TPN x 5 days  (day 4/5) ?BG remains elevated despite increase in long acting insulin from 10 to 15 units. MD increased insulin glargine to 30 units - will continue. Will add 32 units regular insulin to TPN. ?Initiate Moderate q6h SSI and adjust as needed  ?Monitor TPN labs on Mon/Thurs, daily until stable ? ?SamaSherilyn BankerarmD ?Clinical Pharmacist ?09/07/2021 ?7:11 AM ? ? ?

## 2021-09-07 NOTE — Telephone Encounter (Signed)
Copied from [place] [phone]. Topic: General - Other ?>> Sep 07, 2021  3:38 PM McGill, Nelva Bush wrote: ?Reason for CRM: [facility] wanted to make the PCP aware that they would be going to see the patient on Friday. Pt is being discharged from [facility] tomorrow. ?

## 2021-09-07 NOTE — Progress Notes (Signed)
? ? ? ?Hematology/Oncology Consult note ?[place]  ?[phone] [phone] ? ?Patient Care Team: ?Virginia Crews, MD as PCP - General (Family Medicine) ?Theodore Demark, RN as Oncology Nurse Navigator  ? ?Name of the patient: Diamond Hinton  ?[id number]  ?01-14-1956  ? ?Date of visit: 09/07/21 ? ?Interval history-patient had 2 episodes of emesis this morning.  She has not had much to drink but continues to feel nauseous ? ? ?Review of systems- Review of Systems  ?Constitutional:  Positive for malaise/fatigue. Negative for chills, fever and weight loss.  ?HENT:  Negative for congestion, ear discharge and nosebleeds.   ?Eyes:  Negative for blurred vision.  ?Respiratory:  Negative for cough, hemoptysis, sputum production, shortness of breath and wheezing.   ?Cardiovascular:  Negative for chest pain, palpitations, orthopnea and claudication.  ?Gastrointestinal:  Positive for nausea. Negative for abdominal pain, blood in stool, constipation, diarrhea, heartburn, melena and vomiting.  ?Genitourinary:  Negative for dysuria, flank pain, frequency, hematuria and urgency.  ?Musculoskeletal:  Negative for back pain, joint pain and myalgias.  ?Skin:  Negative for rash.  ?Neurological:  Negative for dizziness, tingling, focal weakness, seizures, weakness and headaches.  ?Endo/Heme/Allergies:  Does not bruise/bleed easily.  ?Psychiatric/Behavioral:  Negative for depression and suicidal ideas. The patient does not have insomnia.    ? ? ?Allergies  ?Allergen Reactions  ? Peanuts [Peanut Oil] Shortness Of Breath and Itching  ? ? ? ?Past Medical History:  ?Diagnosis Date  ? Anemia   ? Anxiety   ? Gallstones   ? GERD (gastroesophageal reflux disease)   ? Hyperlipidemia   ? Hypertension   ? ? ? ?Past Surgical History:  ?Procedure Laterality Date  ? BILATERAL SALPINGECTOMY  04/03/1997  ? CHOLECYSTECTOMY N/A 07/07/2015  ? Procedure: LAPAROSCOPIC CHOLECYSTECTOMY ;  Surgeon: Jules Husbands, MD;   Location: [facility] ORS;  Service: General;  Laterality: N/A;  ? ESOPHAGOGASTRODUODENOSCOPY  08/30/2021  ? Procedure: ESOPHAGOGASTRODUODENOSCOPY (EGD);  Surgeon: Lucilla Lame, MD;  Location: [facility] ENDOSCOPY;  Service: Endoscopy;;  ? GALLBLADDER SURGERY  07/07/2015  ? [place]  ? IR BILIARY DRAIN PLACEMENT WITH CHOLANGIOGRAM  08/31/2021  ? IR CONVERT BILIARY DRAIN TO INT EXT BILIARY DRAIN  09/02/2021  ? ? ?Social History  ? ?Socioeconomic History  ? Marital status: Married  ?  Spouse name: Not on file  ? Number of children: Not on file  ? Years of education: Not on file  ? Highest education level: Not on file  ?Occupational History  ? Not on file  ?Tobacco Use  ? Smoking status: Never  ? Smokeless tobacco: Never  ?Vaping Use  ? Vaping Use: Never used  ?Substance and Sexual Activity  ? Alcohol use: No  ? Drug use: No  ? Sexual activity: Yes  ?Other Topics Concern  ? Not on file  ?Social History Narrative  ? Not on file  ? ?Social Determinants of Health  ? ?Financial Resource Strain: Not on file  ?Food Insecurity: Not on file  ?Transportation Needs: Not on file  ?Physical Activity: Not on file  ?Stress: Not on file  ?Social Connections: Not on file  ?Intimate Partner Violence: Not on file  ? ? ?Family History  ?Problem Relation Age of Onset  ? Hypertension Mother   ? CVA Mother   ? Ulcers Mother   ? Heart disease Father   ? Hypertension Sister   ? Diabetes Sister   ? Hypertension Sister   ? Diabetes Sister   ? ? ? ?  Current Facility-Administered Medications:  ?  0.9 %  sodium chloride infusion, , Intravenous, PRN, Ezekiel Slocumb, DO, Last Rate: 10 mL/hr at 09/05/21 0256, Infusion Verify at 09/05/21 0256 ?  acetaminophen (TYLENOL) tablet 650 mg, 650 mg, Oral, Q6H PRN **OR** acetaminophen (TYLENOL) suppository 650 mg, 650 mg, Rectal, Q6H PRN, Athena Masse, MD ?  ALPRAZolam Duanne Moron) tablet 0.25 mg, 0.25 mg, Oral, TID PRN, Nicole Kindred A, DO, 0.25 mg at 09/06/21 2109 ?  bisacodyl (DULCOLAX) suppository 10 mg, 10 mg, Rectal,  Daily PRN, Nicole Kindred A, DO ?  calcium carbonate (TUMS - dosed in mg elemental calcium) chewable tablet 200 mg of elemental calcium, 1 tablet, Oral, TID PRN, Nicole Kindred A, DO, 200 mg of elemental calcium at 09/07/21 1526 ?  Chlorhexidine Gluconate Cloth 2 % PADS 6 each, 6 each, Topical, Daily, Ezekiel Slocumb, DO, 6 each at 09/07/21 3419 ?  feeding supplement (ENSURE ENLIVE / ENSURE PLUS) liquid 237 mL, 237 mL, Oral, BID BM, Ronny Bacon, MD, 237 mL at 09/07/21 1526 ?  hydrALAZINE (APRESOLINE) injection 5 mg, 5 mg, Intravenous, Q6H PRN, Nicole Kindred A, DO ?  hydrochlorothiazide (HYDRODIURIL) tablet 25 mg, 25 mg, Oral, Daily, Nicole Kindred A, DO, 25 mg at 09/07/21 0809 ?  insulin aspart (novoLOG) injection 0-15 Units, 0-15 Units, Subcutaneous, Q6H, Dorothe Pea, RPH, 15 Units at 09/07/21 1241 ?  insulin glargine-yfgn (SEMGLEE) injection 30 Units, 30 Units, Subcutaneous, Daily, Nolberto Hanlon, MD, 30 Units at 09/07/21 1009 ?  iohexol (OMNIPAQUE) 350 MG/ML injection 50 mL, 50 mL, Per Tube, Once PRN, Greggory Keen, MD ?  levothyroxine (SYNTHROID) tablet 75 mcg, 75 mcg, Oral, Q0600, Nicole Kindred A, DO, 75 mcg at 09/07/21 0546 ?  lidocaine-EPINEPHrine (XYLOCAINE W/EPI) 1 %-1:100000 (with pres) injection 10 mL, 10 mL, Intradermal, Once, Ronny Bacon, MD ?  lidocaine-EPINEPHrine (XYLOCAINE W/EPI) 2 %-1:100000 (with pres) injection 1.7 mL, 1.7 mL, Intradermal, Once, Ronny Bacon, MD ?  LORazepam (ATIVAN) injection 0.5 mg, 0.5 mg, Intravenous, Q6H PRN, Borders, Kirt Boys, NP ?  menthol-cetylpyridinium (CEPACOL) lozenge 3 mg, 1 lozenge, Oral, PRN, Nicole Kindred A, DO ?  metoprolol tartrate (LOPRESSOR) injection 5 mg, 5 mg, Intravenous, Q6H PRN, Nicole Kindred A, DO, 5 mg at 09/05/21 1653 ?  morphine (PF) 2 MG/ML injection 2 mg, 2 mg, Intravenous, Q2H PRN, Athena Masse, MD, 2 mg at 09/02/21 2204 ?  ondansetron (ZOFRAN) tablet 4 mg, 4 mg, Oral, Q6H PRN **OR** ondansetron (ZOFRAN) injection  4 mg, 4 mg, Intravenous, Q6H PRN, Athena Masse, MD, 4 mg at 09/07/21 0810 ?  pantoprazole (PROTONIX) EC tablet 40 mg, 40 mg, Oral, BID, Amery, Sahar, MD ?  polyethylene glycol (MIRALAX / GLYCOLAX) packet 17 g, 17 g, Oral, Daily, Nicole Kindred A, DO, 17 g at 09/04/21 1741 ?  scopolamine (TRANSDERM-SCOP) 1 MG/3DAYS 1.5 mg, 1 patch, Transdermal, Q72H, Amery, Sahar, MD, 1.5 mg at 09/07/21 1245 ?  sodium chloride flush (NS) 0.9 % injection 10-40 mL, 10-40 mL, Intracatheter, Q12H, Nicole Kindred A, DO, 10 mL at 09/07/21 0815 ?  sodium chloride flush (NS) 0.9 % injection 10-40 mL, 10-40 mL, Intracatheter, PRN, Nicole Kindred A, DO ?  sodium chloride flush (NS) 0.9 % injection 5 mL, 5 mL, Intracatheter, Q8H, Greggory Keen, MD, 5 mL at 09/07/21 0549 ?  TPN ADULT (ION), , Intravenous, Continuous TPN, Rauer, Forde Dandy, RPH, Last Rate: 90 mL/hr at 09/07/21 0349, Infusion Verify at 09/07/21 0349 ?  TPN ADULT (ION), , Intravenous, Continuous TPN, Rauer,  Forde Dandy, RPH ? ?Physical exam:  ?Vitals:  ? 09/06/21 1948 09/07/21 0343 09/07/21 0834 09/07/21 1502  ?BP: (!) 148/91 (!) 143/95 121/83 (!) 137/93  ?Pulse: 89 95 99 (!) 101  ?Resp: '20 20 18 16  '$ ?Temp: 97.9 ?F (36.6 ?C) 97.9 ?F (36.6 ?C)  98.8 ?F (37.1 ?C)  ?TempSrc: Oral Oral  Oral  ?SpO2: 96% 97% 98% 95%  ?Weight:      ?Height:      ? ?Physical Exam ?Constitutional:   ?   General: She is not in acute distress. ?Cardiovascular:  ?   Rate and Rhythm: Normal rate and regular rhythm.  ?   Heart sounds: Normal heart sounds.  ?Pulmonary:  ?   Effort: Pulmonary effort is normal.  ?Abdominal:  ?   General: Bowel sounds are normal.  ?   Palpations: Abdomen is soft.  ?   Comments: Mild distension  ?Skin: ?   General: Skin is warm and dry.  ?Neurological:  ?   Mental Status: She is alert and oriented to person, place, and time.  ?  ? ? ?  Latest Ref Rng & Units 09/07/2021  ?  3:40 AM  ?CMP  ?Glucose 70 - 99 mg/dL 337    ?BUN 8 - 23 mg/dL 21    ?Creatinine 0.44 - 1.00 mg/dL 0.51     ?Sodium 135 - 145 mmol/L 133    ?Potassium 3.5 - 5.1 mmol/L 4.2    ?Chloride 98 - 111 mmol/L 95    ?CO2 22 - 32 mmol/L 28    ?Calcium 8.9 - 10.3 mg/dL 10.1    ? ? ?  Latest Ref Rng & Units 09/02/2021  ?

## 2021-09-08 ENCOUNTER — Encounter
Admission: EM | Disposition: A | Discharge status: Home / Self Care | Payer: Self-pay | Source: Home / Self Care | Attending: Internal Medicine

## 2021-09-08 ENCOUNTER — Inpatient Hospital Stay: Payer: Medicare HMO | Admitting: Certified Registered"

## 2021-09-08 ENCOUNTER — Encounter: Payer: Self-pay | Admitting: Internal Medicine

## 2021-09-08 ENCOUNTER — Other Ambulatory Visit: Payer: Self-pay

## 2021-09-08 DIAGNOSIS — Z515 Encounter for palliative care: Secondary | ICD-10-CM | POA: Diagnosis not present

## 2021-09-08 DIAGNOSIS — C50912 Malignant neoplasm of unspecified site of left female breast: Secondary | ICD-10-CM

## 2021-09-08 DIAGNOSIS — K831 Obstruction of bile duct: Secondary | ICD-10-CM | POA: Diagnosis not present

## 2021-09-08 LAB — COMPREHENSIVE METABOLIC PANEL
ALT: 74 U/L — ABNORMAL HIGH (ref 0–44)
AST: 41 U/L (ref 15–41)
Albumin: 3.3 g/dL — ABNORMAL LOW (ref 3.5–5.0)
Alkaline Phosphatase: 95 U/L (ref 38–126)
Anion gap: 10 (ref 5–15)
BUN: 34 mg/dL — ABNORMAL HIGH (ref 8–23)
CO2: 29 mmol/L (ref 22–32)
Calcium: 10.4 mg/dL — ABNORMAL HIGH (ref 8.9–10.3)
Chloride: 96 mmol/L — ABNORMAL LOW (ref 98–111)
Creatinine, Ser: 0.66 mg/dL (ref 0.44–1.00)
GFR, Estimated: >60 mL/min (ref >60)
Glucose, Bld: 283 mg/dL — ABNORMAL HIGH (ref 70–99)
Potassium: 4.6 mmol/L (ref 3.5–5.1)
Sodium: 135 mmol/L (ref 135–145)
Total Bilirubin: 2.9 mg/dL — ABNORMAL HIGH (ref 0.3–1.2)
Total Protein: 7.7 g/dL (ref 6.5–8.1)

## 2021-09-08 LAB — MAGNESIUM: Magnesium: 2.4 mg/dL (ref 1.7–2.4)

## 2021-09-08 LAB — GLUCOSE, CAPILLARY
Glucose-Capillary: 303 mg/dL — ABNORMAL HIGH (ref 70–99)
Glucose-Capillary: 358 mg/dL — ABNORMAL HIGH (ref 70–99)
Glucose-Capillary: 221 mg/dL — ABNORMAL HIGH (ref 70–99)

## 2021-09-08 LAB — PHOSPHORUS: Phosphorus: 4.6 mg/dL (ref 2.5–4.6)

## 2021-09-08 SURGERY — INSERTION, GASTROSTOMY TUBE, ROBOT-ASSISTED
Anesthesia: General

## 2021-09-08 MED ORDER — FENTANYL CITRATE (PF) 100 MCG/2ML IJ SOLN
INTRAMUSCULAR | Status: AC
  Filled 2021-09-08: qty 2

## 2021-09-08 MED ORDER — LACTATED RINGERS IV SOLN: INTRAVENOUS | Status: DC | PRN

## 2021-09-08 MED ORDER — HYDROMORPHONE HCL 1 MG/ML IJ SOLN
INTRAMUSCULAR | Status: DC | PRN
  Administered 2021-09-08 (×2): .5 mg via INTRAVENOUS

## 2021-09-08 MED ORDER — OXYCODONE HCL 5 MG/5ML PO SOLN: 5.0000 mg | Freq: Once | ORAL | Status: DC | PRN

## 2021-09-08 MED ORDER — PHENYLEPHRINE 40 MCG/ML (10ML) SYRINGE FOR IV PUSH (FOR BLOOD PRESSURE SUPPORT)
PREFILLED_SYRINGE | INTRAVENOUS | Status: DC | PRN
  Administered 2021-09-08 (×2): 100 ug via INTRAVENOUS

## 2021-09-08 MED ORDER — SODIUM CHLORIDE 0.9 % IR SOLN
Status: DC | PRN
  Administered 2021-09-08: 1000 mL

## 2021-09-08 MED ORDER — HYDROMORPHONE HCL 1 MG/ML IJ SOLN
INTRAMUSCULAR | Status: AC
  Filled 2021-09-08: qty 1

## 2021-09-08 MED ORDER — TRAVASOL 10 % IV SOLN
INTRAVENOUS | Status: AC
  Filled 2021-09-08: qty 1036.8

## 2021-09-08 MED ORDER — OXYCODONE HCL 5 MG PO TABS: 5.0000 mg | ORAL_TABLET | Freq: Once | ORAL | Status: DC | PRN

## 2021-09-08 MED ORDER — HYDROMORPHONE HCL 1 MG/ML IJ SOLN: 0.5000 mg | INTRAMUSCULAR | Status: DC | PRN

## 2021-09-08 MED ORDER — BUPIVACAINE-EPINEPHRINE (PF) 0.25% -1:200000 IJ SOLN
INTRAMUSCULAR | Status: AC
  Filled 2021-09-08: qty 30

## 2021-09-08 MED ORDER — FENTANYL CITRATE (PF) 100 MCG/2ML IJ SOLN: 25.0000 ug | INTRAMUSCULAR | Status: DC | PRN

## 2021-09-08 MED ORDER — INSULIN GLARGINE-YFGN 100 UNIT/ML ~~LOC~~ SOLN
35.0000 [IU] | Freq: Every day | SUBCUTANEOUS | Status: DC
  Administered 2021-09-08: 35 [IU] via SUBCUTANEOUS
  Filled 2021-09-08 (×2): qty 0.35

## 2021-09-08 MED ORDER — ESMOLOL HCL 100 MG/10ML IV SOLN
INTRAVENOUS | Status: DC | PRN
  Administered 2021-09-08: 20 mg via INTRAVENOUS

## 2021-09-08 MED ORDER — SODIUM CHLORIDE 0.9 % IV SOLN
INTRAVENOUS | Status: AC
  Filled 2021-09-08: qty 2

## 2021-09-08 MED ORDER — 0.9 % SODIUM CHLORIDE (POUR BTL) OPTIME
TOPICAL | Status: DC | PRN
  Administered 2021-09-08: 200 mL

## 2021-09-08 MED ORDER — BUPIVACAINE-EPINEPHRINE (PF) 0.25% -1:200000 IJ SOLN
INTRAMUSCULAR | Status: DC | PRN
  Administered 2021-09-08: 50 mL

## 2021-09-08 MED ORDER — ONDANSETRON HCL 4 MG/2ML IJ SOLN
INTRAMUSCULAR | Status: DC | PRN
  Administered 2021-09-08: 4 mg via INTRAVENOUS

## 2021-09-08 MED ORDER — SODIUM CHLORIDE 0.9 % IV SOLN
2.0000 g | Freq: Once | INTRAVENOUS | Status: AC
  Administered 2021-09-08: 2 g via INTRAVENOUS

## 2021-09-08 MED ORDER — ALBUMIN HUMAN 5 % IV SOLN: INTRAVENOUS | Status: DC | PRN

## 2021-09-08 MED ORDER — SUCCINYLCHOLINE CHLORIDE 200 MG/10ML IV SOSY
PREFILLED_SYRINGE | INTRAVENOUS | Status: DC | PRN
  Administered 2021-09-08: 100 mg via INTRAVENOUS

## 2021-09-08 MED ORDER — LIDOCAINE HCL (CARDIAC) PF 100 MG/5ML IV SOSY
PREFILLED_SYRINGE | INTRAVENOUS | Status: DC | PRN
Start: 2021-09-08 — End: 2021-09-08
  Administered 2021-09-08: 100 mg via INTRAVENOUS
  Administered 2021-09-08: 60 mg via INTRAVENOUS

## 2021-09-08 MED ORDER — SUGAMMADEX SODIUM 200 MG/2ML IV SOLN
INTRAVENOUS | Status: DC | PRN
  Administered 2021-09-08: 200 mg via INTRAVENOUS

## 2021-09-08 MED ORDER — ALBUMIN HUMAN 5 % IV SOLN
INTRAVENOUS | Status: AC
  Filled 2021-09-08: qty 500

## 2021-09-08 MED ORDER — TRAVASOL 10 % IV SOLN
INTRAVENOUS | Status: DC
  Filled 2021-09-08: qty 1036.8

## 2021-09-08 MED ORDER — PROPOFOL 10 MG/ML IV BOLUS
INTRAVENOUS | Status: DC | PRN
  Administered 2021-09-08: 100 mg via INTRAVENOUS

## 2021-09-08 MED ORDER — KETOROLAC TROMETHAMINE 30 MG/ML IJ SOLN
30.0000 mg | Freq: Four times a day (QID) | INTRAMUSCULAR | Status: AC | PRN
  Administered 2021-09-09 – 2021-09-13 (×4): 30 mg via INTRAVENOUS
  Filled 2021-09-08 (×4): qty 1

## 2021-09-08 MED ORDER — FENTANYL CITRATE (PF) 100 MCG/2ML IJ SOLN
INTRAMUSCULAR | Status: AC
Start: 2021-09-08 — End: ?
  Filled 2021-09-08: qty 2

## 2021-09-08 MED ORDER — ROCURONIUM BROMIDE 100 MG/10ML IV SOLN
INTRAVENOUS | Status: DC | PRN
  Administered 2021-09-08: 20 mg via INTRAVENOUS
  Administered 2021-09-08: 50 mg via INTRAVENOUS

## 2021-09-08 MED ORDER — FENTANYL CITRATE (PF) 100 MCG/2ML IJ SOLN
INTRAMUSCULAR | Status: DC | PRN
  Administered 2021-09-08: 25 ug via INTRAVENOUS
  Administered 2021-09-08: 50 ug via INTRAVENOUS
  Administered 2021-09-08: 25 ug via INTRAVENOUS
  Administered 2021-09-08 (×2): 50 ug via INTRAVENOUS

## 2021-09-08 MED ORDER — ESMOLOL HCL 100 MG/10ML IV SOLN
INTRAVENOUS | Status: AC
  Filled 2021-09-08: qty 10

## 2021-09-08 MED ORDER — MIDAZOLAM HCL 2 MG/2ML IJ SOLN
INTRAMUSCULAR | Status: AC
  Filled 2021-09-08: qty 2

## 2021-09-08 MED ORDER — MIDAZOLAM HCL 2 MG/2ML IJ SOLN
INTRAMUSCULAR | Status: DC | PRN
  Administered 2021-09-08: 2 mg via INTRAVENOUS

## 2021-09-08 MED ORDER — SODIUM CHLORIDE 0.9 % IV SOLN: Freq: Once | INTRAVENOUS | Status: AC

## 2021-09-08 MED ORDER — DEXAMETHASONE SODIUM PHOSPHATE 10 MG/ML IJ SOLN
INTRAMUSCULAR | Status: DC | PRN
  Administered 2021-09-08: 10 mg via INTRAVENOUS

## 2021-09-08 MED ORDER — DEXMEDETOMIDINE (PRECEDEX) IN NS 20 MCG/5ML (4 MCG/ML) IV SYRINGE
PREFILLED_SYRINGE | INTRAVENOUS | Status: DC | PRN
Start: 2021-09-08 — End: 2021-09-08
  Administered 2021-09-08 (×2): 8 ug via INTRAVENOUS

## 2021-09-08 MED ORDER — BUPIVACAINE LIPOSOME 1.3 % IJ SUSP
INTRAMUSCULAR | Status: AC
  Filled 2021-09-08: qty 20

## 2021-09-08 SURGICAL SUPPLY — 61 items
CANNULA REDUC XI 12-8 STAPL (CANNULA) ×1
CANNULA REDUCER 12-8 DVNC XI (CANNULA) ×1 IMPLANT
COVER TIP SHEARS 8 DVNC (MISCELLANEOUS) ×1 IMPLANT
COVER TIP SHEARS 8MM DA VINCI (MISCELLANEOUS) ×1
DERMABOND ADVANCED (GAUZE/BANDAGES/DRESSINGS) ×1
DERMABOND ADVANCED .7 DNX12 (GAUZE/BANDAGES/DRESSINGS) ×1 IMPLANT
DRAPE ARM DVNC X/XI (DISPOSABLE) ×3 IMPLANT
DRAPE COLUMN DVNC XI (DISPOSABLE) ×1 IMPLANT
DRAPE DA VINCI XI ARM (DISPOSABLE) ×4
DRAPE DA VINCI XI COLUMN (DISPOSABLE) ×1
ELECT CAUTERY BLADE 6.4 (BLADE) ×2 IMPLANT
ELECT REM PT RETURN 9FT ADLT (ELECTROSURGICAL) ×2
ELECTRODE REM PT RTRN 9FT ADLT (ELECTROSURGICAL) ×1 IMPLANT
GLOVE SURG ORTHO LTX SZ7.5 (GLOVE) ×3 IMPLANT
GOWN STRL REUS W/ TWL LRG LVL3 (GOWN DISPOSABLE) ×3 IMPLANT
GOWN STRL REUS W/TWL LRG LVL3 (GOWN DISPOSABLE) ×3
KIT PINK PAD W/HEAD ARE REST (MISCELLANEOUS) ×2 IMPLANT
KIT PINK PAD W/HEAD ARM REST (MISCELLANEOUS) ×1 IMPLANT
LABEL OR SOLS (LABEL) ×2 IMPLANT
MANIFOLD NEPTUNE II (INSTRUMENTS) ×2 IMPLANT
NDL INSUFFLATION 14GA 120MM (NEEDLE) ×1 IMPLANT
NEEDLE HYPO 22GX1.5 SAFETY (NEEDLE) ×2 IMPLANT
NEEDLE INSUFFLATION 14GA 120MM (NEEDLE) ×2 IMPLANT
OBTURATOR OPTICAL STANDARD 8MM (TROCAR) ×1
OBTURATOR OPTICAL STND 8 DVNC (TROCAR) ×1
OBTURATOR OPTICALSTD 8 DVNC (TROCAR) ×1 IMPLANT
PACK LAP CHOLECYSTECTOMY (MISCELLANEOUS) ×2 IMPLANT
PENCIL ELECTRO HAND CTR (MISCELLANEOUS) ×1 IMPLANT
RELOAD STAPLE 60 3.5 BLU DVNC (STAPLE) IMPLANT
RELOAD STAPLER 3.5X60 BLU DVNC (STAPLE) ×1 IMPLANT
SCISSORS METZENBAUM CVD 33 (INSTRUMENTS) ×1 IMPLANT
SEAL CANN UNIV 5-8 DVNC XI (MISCELLANEOUS) ×3 IMPLANT
SEAL XI 5MM-8MM UNIVERSAL (MISCELLANEOUS) ×3
SET TUBE SMOKE EVAC HIGH FLOW (TUBING) ×2 IMPLANT
SOLUTION ELECTROLUBE (MISCELLANEOUS) ×2 IMPLANT
SPONGE DRAIN TRACH 4X4 STRL 2S (GAUZE/BANDAGES/DRESSINGS) ×1 IMPLANT
SPONGE T-LAP 18X18 ~~LOC~~+RFID (SPONGE) ×2 IMPLANT
STAPLER 60 DA VINCI SURE FORM (STAPLE) ×1
STAPLER 60 SUREFORM DVNC (STAPLE) IMPLANT
STAPLER CANNULA SEAL DVNC XI (STAPLE) ×1 IMPLANT
STAPLER CANNULA SEAL XI (STAPLE) ×1
STAPLER RELOAD 3.5X60 BLU DVNC (STAPLE) ×1
STAPLER RELOAD 3.5X60 BLUE (STAPLE) ×1
SUT ETHILON 3-0 FS-10 30 BLK (SUTURE) ×2
SUT MNCRL 4-0 (SUTURE) ×2
SUT MNCRL 4-0 27XMFL (SUTURE) ×2
SUT V-LOC 90 ABS 3-0 VLT  V-20 (SUTURE) ×1
SUT V-LOC 90 ABS 3-0 VLT V-20 (SUTURE) ×1 IMPLANT
SUT VIC AB 2-0 SH 27 (SUTURE) ×2
SUT VIC AB 2-0 SH 27XBRD (SUTURE) IMPLANT
SUT VICRYL 0 AB UR-6 (SUTURE) ×4 IMPLANT
SUT VLOC 180 2-0 9IN GS21 (SUTURE) ×2 IMPLANT
SUT VLOC 90 S/L VL9 GS22 (SUTURE) ×2 IMPLANT
SUTURE EHLN 3-0 FS-10 30 BLK (SUTURE) ×1 IMPLANT
SUTURE MNCRL 4-0 27XMF (SUTURE) ×1 IMPLANT
SYR 20ML LL LF (SYRINGE) ×2 IMPLANT
SYR 30ML LL (SYRINGE) ×2 IMPLANT
TAPE TRANSPORE STRL 2 31045 (GAUZE/BANDAGES/DRESSINGS) IMPLANT
TRAY FOLEY SLVR 16FR LF STAT (SET/KITS/TRAYS/PACK) ×1 IMPLANT
TROCAR XCEL NON-BLD 5MMX100MML (ENDOMECHANICALS) ×1 IMPLANT
WATER STERILE IRR 500ML POUR (IV SOLUTION) ×2 IMPLANT

## 2021-09-08 NOTE — Op Note (Addendum)
Robotic gastrojejunostomy. ? 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 ? ?Post-operative Diagnosis: same.   ? ?Surgeon: Ronny Bacon, M.D., FACS ? ?Anesthesia: General endotracheal anesthesia. ? ?Findings: Palpable mass right epigastrium.  Nasogastric tube placed by anesthesia with large volume output.  No evidence of hepatic metastases, or peritoneal implants. ? ?Estimated Blood Loss: 15 mL ?        ?Specimens: None    ?      ?Complications: none ?        ?     ?Procedure Details  ?The patient was seen again in the Holding Room. The benefits, complications, treatment options, and expected outcomes were discussed with the patient. The risks of bleeding, infection, recurrence of symptoms, failure to resolve symptoms, unanticipated injury, prosthetic placement, prosthetic infection, any of which could require further surgery were reviewed with the patient. The likelihood of improving the patient's symptoms with return to their baseline status is hopeful.  The patient and/or family concurred with the proposed plan, giving informed consent.  The patient was taken to Operating Room, identified and the procedure verified.   ? ?Prior to the induction of general anesthesia, antibiotic prophylaxis was administered. VTE prophylaxis was in place.  General anesthesia was then administered and tolerated well. After the induction, the patient was positioned in the supine position and the abdomen was prepped with  Chloraprep and draped in the sterile fashion.  ?A Time Out was held and the above information confirmed. ? ?After local infiltration of quarter percent Marcaine with epinephrine, stab incision was made left upper quadrant.  Just below the costal margin approximately midclavicular line the Veress needle is passed with sensation of the layers to penetrate the abdominal wall and into the peritoneum.  Saline drop test is confirmed peritoneal placement.  Insufflation  is initiated with carbon dioxide to pressures of 15 mmHg. ?Under direct visualization an optical entry is performed with a 5 mm robotic scope placing a 8.5 mm robotic trocar to the right of midline, infraumbilical location.  Under direct visualization the 12 mm robotic trocars placed in the right lower quadrant, and 2 additional 8.5 mm trocars were placed in the left abdominal wall at the same level.  All under direct visualization.  Periumbilical abdominal wall adhesions were identified around the midline, these were omental and required lysing for eventual mobilization of the transverse colon.  A 5 mm accessory port was placed in the right upper quadrant, and these adhesions were lysed.  This allowed adequate mobility of all the instruments. ?The robot was brought to the patient's left side and docked for upper abdominal procedure. ?With tips up fenestrated grasper and force bipolar grasper were able to mobilize the omentum and transverse colon lifting it up to allow visualization of the ligament of Treitz.  The small bowel was then walked from the ligament of Treitz a good 40 to 50 cm which allowed excellent tension-free mobilization to an antecolic position to the anterior inferior aspect of the stomach. ?Stay sutures were applied to align the jejunum, aligning the proximal jejunum with the proximal aspect of the stomach. ?Enterostomy and gastrostomy's were created, and utilizing a 60 mm robotic stapler with a blue load created a common channel between the stomach and the jejunum.  There is no evidence of bleeding from the staple line. ?The common enterotomy was then closed horizontally utilizing running 3 -0 V lock in multiple layers. ? ? ? ? ? ? ? ? ? ? ?I requested anesthesia to  administer ICG, diluted 100 mL and additional volumes totaling 500 mL of saline were added via the NG.  We could identify the ICG in the stomach and in the small bowel without any evidence of leak.  Although I did not see any ICG pass  distal through the efferent loop. ?This concluded procedure we then undocked the robot, irrigated the upper abdomen with 3 L of saline solution, primarily due to the biliary stent.  This was then aspirated.  The gastric contents were then aspirated prior to the patient being awakened. ?We then closed the right lower quadrant 2 trocar site fascia with PMI and 0 Vicryl.  Skin was closed with subcuticular 4-0 Monocryl and sealed with Dermabond. ?She appeared tolerated procedure well. ? ? ? ? ?Ronny Bacon M.D., FACS ?[facility] ?09/08/2021 8:24 PM   ?

## 2021-09-08 NOTE — Progress Notes (Signed)
?   09/08/21 1100  ?Clinical Encounter Type  ?Visited With Patient and family together  ?Visit Type Initial  ?Referral From Nurse ?(Advance Directive)  ? ?Chaplain facilitated completion of Advance Directive  ?

## 2021-09-08 NOTE — Progress Notes (Addendum)
Inpatient Diabetes Program Recommendations ? ?AACE/ADA: New Consensus Statement on Inpatient Glycemic Control (2015) ? ?Target Ranges:  Prepandial:   less than 140 mg/dL ?     Peak postprandial:   less than 180 mg/dL (1-2 hours) ?     Critically ill patients:  140 - 180 mg/dL  ? ? Latest Reference Range & Units 09/06/21 23:57 09/07/21 05:39 09/07/21 08:33 09/07/21 11:41 09/07/21 16:43  ?Glucose-Capillary 70 - 99 mg/dL 333 (H) ? ?11 units Novolog ? 358 (H) ? ?15 units Novolog ? 359 (H) 361 (H) ? ?15 units Novolog ? ?30 units Semglee @10am  360 (H) ? ?15 units Novolog ?@1812   ?(H): Data is abnormally high ? Latest Reference Range & Units 09/07/21 23:46 09/08/21 05:21  ?Glucose-Capillary 70 - 99 mg/dL 325 (H) ? ?11 units Novolog ? 303 (H) ? ?11 units Novolog ?  ?(H): Data is abnormally high ? ? ?Admit with:  ?Obstructive jaundice ?Metastatic breast cancer ([place]) ?Gastric outlet obstruction ? ?History: DM2 ? ?Home DM Meds: None ? ?Current Orders: Novolog 0-15 units Q6H ?   Semglee 30 units daily ?   TPN @ 90 ml/hr + 32 units Insulin included in solution ? ? ? ?To discharge home with Insulin + Freestyle Libre 2 CGM ? ?Diabetes RN to provide Freestyle Libre 2 CGM to pt today prior to discharge ? ? ? ?MD--Note CBG 325-303 since Midnight. ? ?Please consider: ? ?1. Increase Semglee to 35 units Daily ? ?2. Increase Insulin in the TPN per pharmacy ? ? ?Addendum 11am--Met w/ pt and DTR at bedside this AM.  Reviewed Insulins that we are giving to pt (type, how they work, when we are giving, etc).  Pt remains interested in starting Freestyle Libre 2 CGM for home use.  Looks like pt will require insulin for home.  Pt stated she is still leaning towards vial and syringe over insulin pens.  Has been allowed to practice self-injection here in the hospital and has some comfort with self injection at this point.  We also reviewed the Freestyle Libre 2 CGM system.  Explained what the CGM looks like, how it works, how to apply, etc.  I  currently have a reader and 3 sensors for pt but have not applied first CGM on pt yet since she is going to surgery today.  Asked dtr at bedside to please watch the video on how to start and use the Freestyle [place] 2 CGM system so she can assist pt at home (video can be found on the [employer] website).  We also discussed the importance of keeping a close eye on her glucose levels at home (especially when TPN is tapered/reduced) because Insulin requirements will likely also drop with reduction of TPN rates.  Pt/ Dtr aware that they need to contact outpatient MDs for guidance on insulin reduction when that time comes. ? ? ? ?--Will follow patient during hospitalization-- ? ?Wyn Quaker RN, MSN, CDE ?Diabetes Coordinator ?Inpatient Glycemic Control Team ?Team Pager: [phone] (8a-5p) ? ? ? ? ?

## 2021-09-08 NOTE — Anesthesia Procedure Notes (Signed)
Procedure Name: Intubation ?Date/Time: 09/08/2021 5:28 PM ?Performed by: Aline Brochure, CRNA ?Pre-anesthesia Checklist: Patient identified, Patient being monitored, Timeout performed, Emergency Drugs available and Suction available ?Patient Re-evaluated:Patient Re-evaluated prior to induction ?Oxygen Delivery Method: Circle system utilized ?Preoxygenation: Pre-oxygenation with 100% oxygen ?Induction Type: IV induction and Rapid sequence ?Laryngoscope Size: 3 and McGraph ?Grade View: Grade I ?Tube type: Oral ?Tube size: 6.5 mm ?Number of attempts: 1 ?Airway Equipment and Method: Stylet and Video-laryngoscopy ?Placement Confirmation: ETT inserted through vocal cords under direct vision, positive ETCO2 and breath sounds checked- equal and bilateral ?Secured at: 20 cm ?Tube secured with: Tape ?Dental Injury: Teeth and Oropharynx as per pre-operative assessment  ? ? ? ? ?

## 2021-09-08 NOTE — Transfer of Care (Signed)
Immediate Anesthesia Transfer of Care Note ? ?Patient: Diamond Hinton ? ?Procedure(s) Performed: XI [place]; GASTRO JEJUNOSTOMY ? ?Patient Location: PACU ? ?Anesthesia Type:General ? ?Level of Consciousness: awake ? ?Airway & Oxygen Therapy: Patient Spontanous Breathing and Patient connected to face mask oxygen ? ?Post-op Assessment: Report given to RN and Post -op Vital signs reviewed and stable ? ?Post vital signs: Reviewed and stable ? ?Last Vitals:  ?Vitals Value Taken Time  ?BP 126/96 09/08/21 2030  ?Temp 36.6 ?C 09/08/21 2030  ?Pulse 96 09/08/21 2043  ?Resp 22 09/08/21 2043  ?SpO2 96 % 09/08/21 2043  ?Vitals shown include unvalidated device data. ? ?Last Pain:  ?Vitals:  ? 09/08/21 2030  ?TempSrc:   ?PainSc: Asleep  ?   ? ?Patients Stated Pain Goal: 0 (08/31/21 2126) ? ?Complications: No notable events documented. ?

## 2021-09-08 NOTE — Progress Notes (Signed)
?PROGRESS NOTE ? ? ? ?Diamond Hinton  GUR:[id number] DOB: 20-Mar-1956 DOA: 08/29/2021 ?PCP: Virginia Crews, MD  ? ? ?Brief Narrative:  ?This 66 years old female with PMH significant for diabetes, hypertension, hypothyroidism, prior cholecystectomy sent by her PCP with abnormal labs notably elevated liver enzymes.  Patient reports having gastric reflux symptoms for about a month and has been receiving treatment with famotidine, sucralfate but her symptoms had not improved.  Work-up in the ED shows elevated AST ALT and total bilirubin, lipase 395, UA: Large LE.  CT abdomen and pelvis shows marked distention of the stomach finding suggestive of possible benign or malignant stricture.  Endoscopic evaluation recommended.  Left breast mass with slightly spiculated margins consider outpatient mammogram.   ?  ?GI was consulted.  They attempted ERCP on 3/21, but unable to proceed due to food and liquid residue in the stomach.  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   ?  ?IR consulted for biliary drain placement on 3/22, but unable to advance drain past sphincter of Oddi. ?Returned to IR on 3/24 for advancement of drain.   ?  ?Preliminary biopsy results showing this is likely metastatic breast cancer.  Surgery consulted for consideration of a J tube for nutrition.  Oncology also consulted. ? ?3/30 overnight had vomiting.  Nausea is better controlled this a.m. specially when she sits up.  No new complaints ? ?Consultants:  ?Oncology surgery ? ?Procedures:  ? ?Antimicrobials:  ?  ? ? ?Subjective: ?As above.  No shortness of breath or chest pain. +BM ? ?Objective: ?Vitals:  ? 09/07/21 1502 09/07/21 2051 09/08/21 0304 09/08/21 0800  ?BP: (!) 137/93 127/86 128/81 128/83  ?Pulse: (!) 101 85 94 85  ?Resp: '16 18 16 19  '$ ?Temp: 98.8 ?F (37.1 ?C) 98.1 ?F (36.7 ?C) 97.8 ?F (36.6 ?C) 98 ?F (36.7 ?C)  ?TempSrc: Oral Oral    ?SpO2: 95% 97% 96% 96%  ?Weight:      ?Height:      ? ? ?Intake/Output Summary  (Last 24 hours) at 09/08/2021 1235 ?Last data filed at 09/08/2021 0533 ?Gross per 24 hour  ?Intake 961.6 ml  ?Output 385 ml  ?Net 576.6 ml  ? ?Filed Weights  ? 08/29/21 1706 09/03/21 1200  ?Weight: 64 kg 59.7 kg  ? ? ?Examination: ?Calm, NAD ?Cta no w/r ?Reg s1/s2 no gallop ?Soft benign dressing in place.  Biliary drain with bilious fluid ?No edema ?Aaoxox3  ?Mood and affect appropriate in current setting  ? ? ? ?Data Reviewed: I have personally reviewed following labs and imaging studies ? ?CBC: ?Recent Labs  ?Lab 09/02/21 ?2831  ?WBC 10.4  ?HGB 14.3  ?HCT 42.3  ?MCV 88.3  ?PLT 274  ? ?Basic Metabolic Panel: ?Recent Labs  ?Lab 09/04/21 ?0427 09/05/21 ?5176 09/06/21 ?1607 09/07/21 ?0340 09/08/21 ?0319  ?NA 141 135 138 133* 135  ?K 3.7 3.7 4.1 4.2 4.6  ?CL 107 103 106 95* 96*  ?CO2 '28 23 23 28 29  '$ ?GLUCOSE 221* 241* 289* 337* 283*  ?BUN 30* '22 22 21 '$ 34*  ?CREATININE 0.64 0.55 0.67 0.51 0.66  ?CALCIUM 8.4* 8.6* 9.1 10.1 10.4*  ?MG 2.5* 2.2 2.1 2.3 2.4  ?PHOS 2.4* 2.1* 3.3 3.9 4.6  ? ?GFR: ?Estimated Creatinine Clearance: 57.7 mL/min (by C-G formula based on SCr of 0.66 mg/dL). ?Liver Function Tests: ?Recent Labs  ?Lab 09/03/21 ?3710 09/04/21 ?6269 09/05/21 ?4854 09/06/21 ?6270 09/08/21 ?0319  ?AST 100* 73* 54* 38 41  ?  ALT 149* 122* 103* 86* 74*  ?ALKPHOS 125 99 94 87 95  ?BILITOT 4.9* 3.9* 3.2* 2.7* 2.9*  ?PROT 6.4* 6.2* 6.3* 6.7 7.7  ?ALBUMIN 3.1* 3.0* 2.9* 3.2* 3.3*  ? ?No results for input(s): LIPASE, AMYLASE in the last 168 hours. ?No results for input(s): AMMONIA in the last 168 hours. ?Coagulation Profile: ?No results for input(s): INR, PROTIME in the last 168 hours. ?Cardiac Enzymes: ?No results for input(s): CKTOTAL, CKMB, CKMBINDEX, TROPONINI in the last 168 hours. ?BNP (last 3 results) ?No results for input(s): PROBNP in the last 8760 hours. ?HbA1C: ?No results for input(s): HGBA1C in the last 72 hours. ?CBG: ?Recent Labs  ?Lab 09/07/21 ?1141 09/07/21 ?1643 09/07/21 ?2346 09/08/21 ?7564 09/08/21 ?1202  ?GLUCAP  361* 360* 325* 303* 358*  ? ?Lipid Profile: ?No results for input(s): CHOL, HDL, LDLCALC, TRIG, CHOLHDL, LDLDIRECT in the last 72 hours. ? ?Thyroid Function Tests: ?No results for input(s): TSH, T4TOTAL, FREET4, T3FREE, THYROIDAB in the last 72 hours. ?Anemia Panel: ?No results for input(s): VITAMINB12, FOLATE, FERRITIN, TIBC, IRON, RETICCTPCT in the last 72 hours. ?Sepsis Labs: ?No results for input(s): PROCALCITON, LATICACIDVEN in the last 168 hours. ? ?Recent Results (from the past 240 hour(s))  ?Aerobic/Anaerobic Culture w Gram Stain (surgical/deep wound)     Status: None  ? Collection Time: 08/31/21  2:31 PM  ? Specimen: BILE  ?Result Value Ref Range Status  ? Specimen Description   Final  ?  BILE ?Performed at [facility], [address] [place], [place] [address] ?  ? Special Requests   Final  ?  NONE ?Performed at [facility], [address], [place], [place] [address] ?  ? Gram Stain NO WBC SEEN ?NO ORGANISMS SEEN ?  Final  ? Culture   Final  ?  No growth aerobically or anaerobically. ?Performed at [facility], [place] [address] [place], [place] [address] ?  ? Report Status 09/05/2021 FINAL  Final  ?  ? ? ? ? ? ?Radiology Studies: ?No results found. ? ? ? ? ? ?Scheduled Meds: ? Chlorhexidine Gluconate Cloth  6 each Topical Daily  ? hydrochlorothiazide  25 mg Oral Daily  ? insulin aspart  0-15 Units Subcutaneous Q6H  ? insulin glargine-yfgn  35 Units Subcutaneous Daily  ? levothyroxine  75 mcg Oral Q0600  ? lidocaine-EPINEPHrine  10 mL Intradermal Once  ? lidocaine-EPINEPHrine  1.7 mL Intradermal Once  ? pantoprazole (PROTONIX) IV  40 mg Intravenous Q12H  ? scopolamine  1 patch Transdermal Q72H  ? sodium chloride flush  10-40 mL Intracatheter Q12H  ? sodium chloride flush  5 mL Intracatheter Q8H  ? ?Continuous Infusions: ? sodium chloride 10 mL/hr at 09/05/21 0256  ? TPN ADULT (ION) 90 mL/hr at 09/08/21 0446  ? TPN ADULT (ION)    ? ? ?Assessment & Plan: ?  ?Principal Problem: ?   Obstructive jaundice ?Active Problems: ?  Metastatic breast cancer ([place]) ?  Gastric outlet obstruction ?  Elevated lipase ?  Abnormal LFTs ?  UTI (urinary tract infection) ?  History of cholecystectomy ?  Uncontrolled type 2 diabetes mellitus with hyperglycemia, without long-term current use of insulin ([place]) ?  Adult hypothyroidism ?  Hypertension ?  Hypokalemia ?  Stenosis of duodenum ?  Constipation ?  Hypophosphatemia ?  Goals of care, counseling/discussion ?  Palliative care encounter ? ?Obstructive jaundice ?Presented with jaundice, with history of prior cholecystectomy, with elevated LFTs, lipase. ?GI was consulted and recommended ERCP. ?ERCP  was attempted on 3/21 but unable to be performed due to food and liquid residue within the stomach.  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 ?3/30 Had BM today  ?surgery following, plan for gastrojejunostomy today ?Continue n.p.o. ?Continue TPN ? ? ? ? ? ?Gastric outlet obstruction ?Due to metastatic breast cancer. ?MRCP on 3/21 showed masslike duodenal wall thickening extending to the ampulla, worrisome for duodenal/ampullary carcinoma. ...intrahepatic/extrahepatic ductal dilatation.... prominent pancreatic duct, extending to the ampulla... 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   ?--2.3 cm irregular/spiculated lesion in the left inferior breast, suspicious for primary breast neoplasm.  ?3/29 no surgical intervention ?Biopsy positive for adenocarcinoma with signet ring cells; awaiting breast biopsy result ?Biliary drain placed by IR on 3/22 ?Now tolerating clears and TPN ?PICC line placed on 3/25 and TPN started ?3/30 plan for gastrojejunostomy today, keep npo ? ? ?  ?  ?Elevated lipase ?No focal abnormality seen in the pancreas on CT.  Suspect related to obstructive process. ?3/30 biliary drain placed by IR on 3/22 ? ? ? ? ?  ?Metastatic breast cancer ([place]) ?New diagnosis this admission.   Presented with biliary obstructive symptoms and noted to have spiculated mass in left breast on imaging upon admission.  Biopsied during attempted ERCP and pathology has returned showing invasive lobula

## 2021-09-08 NOTE — Progress Notes (Signed)
Diamond Hinton ?SURGICAL PROGRESS NOTE ? ?Hospital Day(s): 10.  ? ?Post op day(s): 9 Days Post-Op.  ? ?Interval History:  ?Patient seen and examined ?Unfortunately, she continued to have emesis overnight.  ?Patient reports she is feeling better this morning; no further emesis overnight  ?After discussion, she wishes to proceed with gastrojejunostomy, which we will plan or this evening.  ?Biliary drain with 660 ccs out; bilious  ?She is currently NPO  ? ?Vital signs in last 24 hours: [min-max] current  ?Temp:  [97.8 ?F (36.6 ?C)-98.8 ?F (37.1 ?C)] 98 ?F (36.7 ?C) (03/30 0800) ?Pulse Rate:  [85-101] 85 (03/30 0800) ?Resp:  [16-19] 19 (03/30 0800) ?BP: (121-137)/(81-93) 128/83 (03/30 0800) ?SpO2:  [95 %-98 %] 96 % (03/30 0800)     Height: 5' 3.5" (161.3 cm) Weight: 59.7 kg BMI (Calculated): 22.95  ? ?Intake/Output last 2 shifts:  ?03/29 0701 - 03/30 0700 ?In: 961.6 [I.V.:956.6] ?Out: 660 [Drains:660]  ? ?Physical Exam:  ?Constitutional: alert, cooperative and no distress  ?HENT: normocephalic without obvious abnormality ?Eyes: PERRL, EOM's grossly intact and symmetric  ?Respiratory: breathing non-labored at rest  ?Cardiovascular: regular rate and sinus rhythm  ?Gastrointestinal: soft, non-tender, and non-distended, no rebound/guarding. Biliary drain present; output remains bilious ?Musculoskeletal: no edema or wounds, motor and sensation grossly intact, NT  ? ?Labs:  ? ?  Latest Ref Rng & Units 09/02/2021  ?  3:47 AM 09/01/2021  ?  4:11 AM 08/31/2021  ?  4:26 AM  ?CBC  ?WBC 4.0 - 10.5 K/uL 10.4   11.1   9.3    ?Hemoglobin 12.0 - 15.0 g/dL 14.3   13.8   13.4    ?Hematocrit 36.0 - 46.0 % 42.3   41.4   39.6    ?Platelets 150 - 400 K/uL 274   260   241    ? ? ?  Latest Ref Rng & Units 09/08/2021  ?  3:19 AM 09/07/2021  ?  3:40 AM 09/06/2021  ?  5:46 AM  ?CMP  ?Glucose 70 - 99 mg/dL 283   337   289    ?BUN 8 - 23 mg/dL 34   21   22    ?Creatinine 0.44 - 1.00 mg/dL 0.66   0.51   0.67    ?Sodium 135 - 145 mmol/L 135    133   138    ?Potassium 3.5 - 5.1 mmol/L 4.6   4.2   4.1    ?Chloride 98 - 111 mmol/L 96   95   106    ?CO2 22 - 32 mmol/L '29   28   23    '$ ?Calcium 8.9 - 10.3 mg/dL 10.4   10.1   9.1    ?Total Protein 6.5 - 8.1 g/dL 7.7    6.7    ?Total Bilirubin 0.3 - 1.2 mg/dL 2.9    2.7    ?Alkaline Phos 38 - 126 U/L 95    87    ?AST 15 - 41 U/L 41    38    ?ALT 0 - 44 U/L 74    86    ? ? ? ?Imaging studies: No new pertinent imaging studies ? ? ?Assessment/Plan: (ICD-10's: K31.1) ?66 y.o. female with GOO and (improving) obstructive juandice of malignant etiology thought to be possible GI primary vs metastatic left breast mass.  ?  ?            - Again, after continued episodes of emesis yesterday and discussion of  duodenal stenting vs palliative gastrojejunostomy, patient and family have elected to proceed with gastrojejunostomy today. Plan for this today with Dr Christian Mate pending OR/Anesthesia availability.  ? - All risks, benefits, and alternatives to above procedure(s) were discussed with the patient her family, all of her questions were answered to her expressed satisfaction, patient expresses she wishes to proceed, and informed consent was obtained.  ?  ?           - NPO for procedure; she understands she will likely have NGT and need to be NPO for 3 or more days post-operatively  ?           - Agree with PPI for reflux ?           - Continue TPN; monitor electrolytes  ?           - Monitor abdominal examination; on-going bowel function  ?           - Pain control; antiemetics prn ?- Monitor hyperbilirubinemia; improving; continue drain ?- Mobilization as tolerated ?- Appreciate oncology assistance; biopsy + for adenocarcinoma with signet ring cells; Breast biopsy results reviewed  ?- Further management per primary service ?  ?All of the above findings and recommendations were discussed with the patient, and the medical team, and all of patient's questions were answered to her expressed satisfaction. ? ?-- ?Edison Simon,  PA-C ?[facility] ?09/08/2021, 8:33 AM ?[phone] ?M-F: 7am - 4pm ? ?

## 2021-09-08 NOTE — Progress Notes (Signed)
? ?  ?Palliative Medicine ?[place] [facility] ?Telephone:(336) [phone] Fax:(336) [phone] ? ? ?Name: Diamond Hinton ?Date: 09/08/2021 ?MRN: [id number]  ?DOB: [birth year] ? ?Patient Care Team: ?Virginia Crews, MD as PCP - General (Family Medicine) ?Theodore Demark, RN as Oncology Nurse Navigator  ? ? ?REASON FOR CONSULTATION: ?Diamond Hinton is a 66 y.o. female with multiple medical problems including diabetes, hypertension, hypothyroidism, prior cholecystectomy, who was hospitalized on 08/29/2021 with obstructive jaundice.  CT of the abdomen and pelvis and MRCP showed duodenal wall thickening concerning for neoplastic process.  Patient underwent upper endoscopy with findings of severe duodenal stenosis.  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  Patient underwent biliary drain placement by IR.  Patient's hospitalization has been complicated by ongoing gastric outlet obstruction.  Palliative care was consulted up address goals. ? ?CODE STATUS: DNR ? ?PAST MEDICAL HISTORY: ?Past Medical History:  ?Diagnosis Date  ? Anemia   ? Anxiety   ? Gallstones   ? GERD (gastroesophageal reflux disease)   ? Hyperlipidemia   ? Hypertension   ? ? ?PAST SURGICAL HISTORY:  ?Past Surgical History:  ?Procedure Laterality Date  ? BILATERAL SALPINGECTOMY  04/03/1997  ? CHOLECYSTECTOMY N/A 07/07/2015  ? Procedure: LAPAROSCOPIC CHOLECYSTECTOMY ;  Surgeon: Jules Husbands, MD;  Location: [facility] ORS;  Service: General;  Laterality: N/A;  ? ESOPHAGOGASTRODUODENOSCOPY  08/30/2021  ? Procedure: ESOPHAGOGASTRODUODENOSCOPY (EGD);  Surgeon: Lucilla Lame, MD;  Location: [facility] ENDOSCOPY;  Service: Endoscopy;;  ? GALLBLADDER SURGERY  07/07/2015  ? [place]  ? IR BILIARY DRAIN PLACEMENT WITH CHOLANGIOGRAM  08/31/2021  ? IR CONVERT BILIARY DRAIN TO INT EXT BILIARY DRAIN  09/02/2021  ? ? ?HEMATOLOGY/ONCOLOGY HISTORY:  ?Oncology History  ?Metastatic breast cancer ([place])  ?08/29/2021 Initial Diagnosis  ? Metastatic breast  cancer [facility]) ?  ?09/08/2021 -  Chemotherapy  ? Patient is on Treatment Plan : BREAST Abemaciclib + Fulvestrant q28d  ?   ? ? ?ALLERGIES:  is allergic to peanuts [peanut oil]. ? ?MEDICATIONS:  ?Current Facility-Administered Medications  ?Medication Dose Route Frequency Provider Last Rate Last Admin  ? 0.9 %  sodium chloride infusion   Intravenous PRN Nicole Kindred A, DO 10 mL/hr at 09/05/21 0256 Infusion Verify at 09/05/21 0256  ? acetaminophen (TYLENOL) suppository 650 mg  650 mg Rectal Q6H PRN Athena Masse, MD      ? ALPRAZolam Duanne Moron) tablet 0.25 mg  0.25 mg Oral TID PRN Nicole Kindred A, DO   0.25 mg at 09/06/21 2109  ? bisacodyl (DULCOLAX) suppository 10 mg  10 mg Rectal Daily PRN Nicole Kindred A, DO      ? calcium carbonate (TUMS - dosed in mg elemental calcium) chewable tablet 200 mg of elemental calcium  1 tablet Oral TID PRN Nicole Kindred A, DO   200 mg of elemental calcium at 09/07/21 1526  ? Chlorhexidine Gluconate Cloth 2 % PADS 6 each  6 each Topical Daily Nicole Kindred A, DO   6 each at 09/08/21 1320  ? hydrALAZINE (APRESOLINE) injection 5 mg  5 mg Intravenous Q6H PRN Nicole Kindred A, DO      ? hydrochlorothiazide (HYDRODIURIL) tablet 25 mg  25 mg Oral Daily Nicole Kindred A, DO   25 mg at 09/08/21 1010  ? insulin aspart (novoLOG) injection 0-15 Units  0-15 Units Subcutaneous Q6H Dorothe Pea, RPH   15 Units at 09/08/21 1222  ? insulin glargine-yfgn (SEMGLEE) injection 35 Units  35 Units Subcutaneous Daily Amery,  Gwynneth Albright, MD   35 Units at 09/08/21 1010  ? iohexol (OMNIPAQUE) 350 MG/ML injection 50 mL  50 mL Per Tube Once PRN Greggory Keen, MD      ? levothyroxine (SYNTHROID) tablet 75 mcg  75 mcg Oral Q0600 Nicole Kindred A, DO   75 mcg at 09/08/21 0534  ? lidocaine-EPINEPHrine (XYLOCAINE W/EPI) 1 %-1:100000 (with pres) injection 10 mL  10 mL Intradermal Once Ronny Bacon, MD      ? lidocaine-EPINEPHrine (XYLOCAINE W/EPI) 2 %-1:100000 (with pres) injection 1.7 mL  1.7 mL  Intradermal Once Ronny Bacon, MD      ? LORazepam (ATIVAN) injection 0.5 mg  0.5 mg Intravenous Q6H PRN Britnee Mcdevitt, Kirt Boys, NP      ? menthol-cetylpyridinium (CEPACOL) lozenge 3 mg  1 lozenge Oral PRN Nicole Kindred A, DO      ? metoprolol tartrate (LOPRESSOR) injection 5 mg  5 mg Intravenous Q6H PRN Nicole Kindred A, DO   5 mg at 09/05/21 1653  ? morphine (PF) 2 MG/ML injection 2 mg  2 mg Intravenous Q2H PRN Athena Masse, MD   2 mg at 09/02/21 2204  ? ondansetron (ZOFRAN) tablet 4 mg  4 mg Oral Q6H PRN Athena Masse, MD      ? Or  ? ondansetron [facility]) injection 4 mg  4 mg Intravenous Q6H PRN Athena Masse, MD   4 mg at 09/08/21 1024  ? pantoprazole (PROTONIX) injection 40 mg  40 mg Intravenous Q12H Ronny Bacon, MD   40 mg at 09/08/21 1010  ? scopolamine (TRANSDERM-SCOP) 1 MG/3DAYS 1.5 mg  1 patch Transdermal Q72H Nolberto Hanlon, MD   1.5 mg at 09/07/21 1245  ? sodium chloride flush (NS) 0.9 % injection 10-40 mL  10-40 mL Intracatheter Q12H Nicole Kindred A, DO   10 mL at 09/08/21 1011  ? sodium chloride flush (NS) 0.9 % injection 10-40 mL  10-40 mL Intracatheter PRN Nicole Kindred A, DO      ? sodium chloride flush (NS) 0.9 % injection 5 mL  5 mL Intracatheter Q8H Greggory Keen, MD   5 mL at 09/08/21 0533  ? TPN ADULT (ION)   Intravenous Continuous TPN RauerForde Dandy, RPH 90 mL/hr at 09/08/21 0446 Infusion Verify at 09/08/21 0446  ? TPN ADULT (ION)   Intravenous Continuous TPN Nolberto Hanlon, MD      ? ? ?VITAL SIGNS: ?BP 123/88 (BP Location: Left Arm)   Pulse 89   Temp 98 ?F (36.7 ?C)   Resp 18   Ht 5' 3.5" (1.613 m)   Wt 131 lb 9.8 oz (59.7 kg)   SpO2 96%   BMI 22.95 kg/m?  ?Filed Weights  ? 08/29/21 1706 09/03/21 1200  ?Weight: 141 lb (64 kg) 131 lb 9.8 oz (59.7 kg)  ?  ?Estimated body mass index is 22.95 kg/m? as calculated from the following: ?  Height as of this encounter: 5' 3.5" (1.613 m). ?  Weight as of this encounter: 131 lb 9.8 oz (59.7 kg). ? ?LABS: ?CBC: ?   ?Component  Value Date/Time  ? WBC 10.4 09/02/2021 0347  ? HGB 14.3 09/02/2021 0347  ? HGB 15.4 12/01/2015 0829  ? HCT 42.3 09/02/2021 0347  ? HCT 43.6 12/01/2015 0829  ? PLT 274 09/02/2021 0347  ? PLT 258 12/01/2015 0829  ? MCV 88.3 09/02/2021 0347  ? MCV 85 12/01/2015 0829  ? NEUTROABS 8.5 (H) 08/29/2021 1713  ? NEUTROABS 5.0 12/01/2015 0829  ? LYMPHSABS 1.3 08/29/2021  1713  ? LYMPHSABS 2.4 12/01/2015 0829  ? MONOABS 0.6 08/29/2021 1713  ? EOSABS 0.0 08/29/2021 1713  ? EOSABS 0.1 12/01/2015 0829  ? BASOSABS 0.1 08/29/2021 1713  ? BASOSABS 0.0 12/01/2015 0829  ? ?Comprehensive Metabolic Panel: ?   ?Component Value Date/Time  ? NA 135 09/08/2021 0319  ? NA 140 04/20/2021 0833  ? K 4.6 09/08/2021 0319  ? CL 96 (L) 09/08/2021 0319  ? CO2 29 09/08/2021 0319  ? BUN 34 (H) 09/08/2021 0319  ? BUN 13 04/20/2021 0833  ? CREATININE 0.66 09/08/2021 0319  ? GLUCOSE 283 (H) 09/08/2021 0319  ? CALCIUM 10.4 (H) 09/08/2021 0319  ? AST 41 09/08/2021 0319  ? ALT 74 (H) 09/08/2021 0319  ? ALKPHOS 95 09/08/2021 0319  ? BILITOT 2.9 (H) 09/08/2021 0319  ? BILITOT 0.5 04/20/2021 0833  ? PROT 7.7 09/08/2021 0319  ? PROT 7.0 04/20/2021 0833  ? ALBUMIN 3.3 (L) 09/08/2021 0319  ? ALBUMIN 4.9 (H) 04/20/2021 8453  ? ? ?RADIOGRAPHIC STUDIES: ?DG Abd 1 View ? ?Result Date: 09/05/2021 ?CLINICAL DATA:  Follow up to assess clearing of contrast from stomach- 4pm timed image EXAM: ABDOMEN - 1 VIEW COMPARISON:  None. FINDINGS: Gastrostomy tube with tip overlying the mid abdomen. Slightly decreased but persistent PO contrast partially opacifying the gastric lumen. PO contrast also noted throughout the colon. No extravasation of PO contrast noted. The bowel gas pattern is normal. Surgical clips overlie the right upper abdomen. IMPRESSION: 1. Gastrostomy tube in good position. 2. Slightly decreased but persistent PO contrast partially opacifying the gastric lumen. 3.  PO contrast also noted throughout the colon. Electronically Signed   By: Iven Finn M.D.   On:  09/05/2021 16:24  ? ?DG Abd 1 View ? ?Result Date: 09/05/2021 ?CLINICAL DATA:  Duodenal obstruction EXAM: ABDOMEN - 1 VIEW COMPARISON:  Upper GI exam 09/05/2021 FINDINGS: There is a percutaneous biliary drain overlying the

## 2021-09-08 NOTE — TOC Progression Note (Signed)
Transition of Care (TOC) - Progression Note  ? ? ?Patient Details  ?Name: Diamond Hinton ?MRN: [id number] ?Date of Birth: [birth year] ? ?Transition of Care (TOC) CM/SW Contact  ?Beverly Sessions, RN ?Phone Number: ?09/08/2021, 10:34 AM ? ?Clinical Narrative:    ? ? ?Discharge plan on hold.  Plan for  gastrojejunostomy today, likely have NGT and need to be NPO for 3 or more days post-operatively  ?  ? ?Pam with [facility] and Malachy Mood with [facility] aware ?  ? ?Expected Discharge Plan and Services ?  ?  ?  ?  ?  ?                ?  ?  ?  ?  ?  ?  ?  ?  ?  ?  ? ? ?Social Determinants of Health (SDOH) Interventions ?  ? ?Readmission Risk Interventions ?   ? View : No data to display.  ?  ?  ?  ? ? ?

## 2021-09-08 NOTE — Consult Note (Signed)
PHARMACY - TOTAL PARENTERAL NUTRITION CONSULT NOTE  ? ?Indication: Small bowel obstruction ? ?Patient Measurements: ?Height: 5' 3.5" (161.3 cm) ?Weight: 59.7 kg (131 lb 9.8 oz) ?IBW/kg (Calculated) : 53.55 ?TPN AdjBW (KG): 64 ?Body mass index is 22.95 kg/m?. ?Usual Weight: 64 kg ? ?Assessment:  ?66 years old female with PMH significant for diabetes, hypertension, hypothyroidism, prior cholecystectomy sent to [facility] ED 08/29/21 from PCP due to elevated liver enzymes. 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 Patient intolerant to NG tube with increased emesis ? ?Glucose / Insulin: BG 283-359; Semglee 30u + 0-15u Q6H; required 52 u insulin past 24 hrs ?Electrolytes: potassium, magnesium trending up ?Renal: BUN 29 ?Hepatic: AST 73, ALT 122, T Bili 3.9, Albumin 3.0, Protein 6.2 ?Intake / Output; MIVF: +961 mL ?GI Imaging: ?3/20: Suspected masslike duodenal wall thickening extending to the ampulla, worrisome for duodenal/ampullary carcinoma ? ?3/27: Percutaneous internal/external biliary drain  positioning is not significantly changed  ?GI Surgeries / Procedures:  ?3/21: Endoscopy ?3/22: external biliary drain placement  ? ? ?Central access: PICC line planned 09/03/21 ?TPN start date: 09/03/21 ? ?Nutritional Goals: ?Goal TPN rate is 90 mL/hr (provides 104 g of protein and 2186 kcals per day) ? ?RD Assessment: ?Estimated Needs ?Total Energy Estimated Needs: 2000-2200 kcal ?Total Protein Estimated Needs: 100-110 grams ?Total Fluid Estimated Needs: >/= 2 L/day ? ?Current Nutrition:  ?NPO ? ?Plan:  ?Continue TPN at goal rate of 90 mL/hr at 1800. Per surg, pt likely to go home on TPN.  ?Nutritional components ?Amino acids (using 10% Travasol) 103.7 grams ?Dextrose: 324 grams ?Lipids (using 20% SMOFlipids) 67 grams ?kCal 2186 ?Electrolytes in TPN: Na 23mq/L, K 243m/L, Ca 77m86mL, Mg 0mE19m, and Phos15 mmol/L. Cl:Ac 1:1 ?Add standard MVI to  TPN ?Reduce trace elements to 1/2 due to elevated t bili and jaundice ?Add thiamine 100 mg to TPN x 5 days (day 5/5) ?BG remains elevated. MD increased insulin glargine to 35 units.  ?Increase to 40 units regular insulin in TPN ?continue Moderate q6h SSI and adjust as needed  ?Monitor TPN labs on Mon/Thurs, daily until stable ? ?RodnDallie PilesarmD ?Clinical Pharmacist ?09/08/2021 ?7:33 AM ? ? ?

## 2021-09-08 NOTE — Progress Notes (Signed)
Nutrition Follow-up ? ?DOCUMENTATION CODES:  ? ?Not applicable ? ?INTERVENTION:  ? ?-TPN management per pharmacy ?-When able, start feeding via j-port (pt will require continuous feeds with j-tube):  ? ?Initiate Osmolite 1.5 @ 20 ml/hr and increase by 10 ml every 4 hours to goal rate of 60 ml/hr.  ? ?45 ml Prosource TF daily. ? ?160 ml free water flush every 4 hours   ? ?Tube feeding regimen provides 2200 kcal (100% of needs), 101 grams of protein, and 1097 ml of H2O. Total free water: 2057 ml daily  ? ?NUTRITION DIAGNOSIS:  ? ?Increased nutrient needs related to acute illness, catabolic illness as evidenced by estimated needs. ? ?Ongoing ? ?GOAL:  ? ?Patient will meet greater than or equal to 90% of their needs ? ?Met with TPN ? ?MONITOR:  ? ?Diet advancement, Labs, Weight trends, I & O's, Other (Comment) (TPN regimen) ? ?REASON FOR ASSESSMENT:  ? ?Consult ?New TPN/TNA ? ?ASSESSMENT:  ? ?66 year-old female with medical history of anxiety, HTN, HLD, GERD, anemia, and gallstones. She presented to the ED due to jaundice and RUQ abdominal pain. On admission she had elevated bilirubin, LFTs, alk phos. 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 ? ?3/22- NGT placed ?3/25- TPN initiated ?3/28- s/p lt breast biopsy at bedside ?3/27- contrast study reveals evidence of contrast in stomach and liquids passing at a very slow rate; NGT removed and advanced to clear liquid diet ?  ?Reviewed I/O's: +302 ml x 24 hours and +289 ml since admission ? ?Biliary drain output: 660 ml x 24 hours  ? ?Per general surgery notes, pt for palliative gastrojejunostomy today.  ? ?Spoke with pt at bedside, who reports "I'm upset that I could not tolerate the liquids". Pt shares that her nausea has improved, but still feels like she  needs to vomit, but hasn't. Pt reports a general decline in health over the past 2 months, when she had what she suspects was food poisoning (early satiety and gas when eating). These symptoms did not resolve and were accompanied by heartburn. Pt shares that she was prescribed omeprazole and was being treated for gastritis. Her symptoms improved for several weeks, but then started back up, which is what led to this hospital admission.   ? ?Pt shares that she will undergo palliative gastrojejunostomy today. She hopes "I can get back to eating something like a normal person". Per pt, she has been tolerating TPN well; she is receiving at goal rate of 90 ml/hr, which provides 2186 kcals and 104 grams protein, meeting 100% of estimated protein needs. Given pt's obstruction, she will require nutrition support (EN vs TPN) at discharge given her inability to tolerate PO's and advance past a clear liquid diet, which will not meet her needs.  ? ?Palliative care following; pt desires current scope of treatment.  ? ?Labs reviewed: CBGS: 303-360 (inpatient orders for glycemic control are 0-15 units insulin aspart every 6 hours and 35 units insulin glargine-yfgn daily).   ? ?NUTRITION - FOCUSED PHYSICAL EXAM: ? ?Flowsheet Row Most Recent Value  ?Orbital Region No depletion  ?Upper Arm Region No depletion  ?Thoracic and Lumbar Region No depletion  ?Buccal Region No depletion  ?Temple Region No depletion  ?Clavicle Bone Region No depletion  ?Clavicle and Acromion Bone Region No  depletion  ?Scapular Bone Region No depletion  ?Dorsal Hand No depletion  ?Patellar Region Mild depletion  ?Anterior Thigh Region Mild depletion  ?Posterior Calf Region Mild depletion  ?Edema (RD Assessment) None  ?Hair Reviewed  ?Eyes Reviewed  ?Mouth Reviewed  ?Skin Reviewed  ?Nails Reviewed  ? ?  ? ? ?Diet Order:   ?Diet Order   ? ?       ?  Diet NPO time specified Except for: Sips with Meds  Diet effective now       ?  ? ?  ?  ? ?  ? ? ?EDUCATION NEEDS:   ? ?Not appropriate for education at this time ? ?Skin:  Skin Assessment: Reviewed RN Assessment ? ?Last BM:  09/08/21 ? ?Height:  ? ?Ht Readings from Last 1 Encounters:  ?08/29/21 5' 3.5" (1.613 m)  ? ? ?Weight:  ? ?Wt Readings from Last 1 Encounters:  ?09/03/21 59.7 kg  ? ? ?Ideal Body Weight:  53.4 kg ? ?BMI:  Body mass index is 22.95 kg/m?. ? ?Estimated Nutritional Needs:  ? ?Kcal:  2000-2200 kcal ? ?Protein:  100-110 grams ? ?Fluid:  >/= 2 L/day ? ? ? ?Loistine Chance, RD, LDN, CDCES ?Registered Dietitian II ?Certified Diabetes Care and Education Specialist ?Please refer to [facility] for RD and/or RD on-call/weekend/after hours pager  ?

## 2021-09-08 NOTE — Anesthesia Preprocedure Evaluation (Signed)
Anesthesia Evaluation  ?Patient identified by MRN, date of birth, ID band ?Patient awake ? ? ? ?Reviewed: ?Allergy & Precautions, NPO status , Patient's Chart, lab work & pertinent test results ? ?History of Anesthesia Complications ?Negative for: history of anesthetic complications ? ?Airway ?Mallampati: III ? ?TM Distance: <3 FB ?Neck ROM: full ? ? ? Dental ? ?(+) Chipped, Poor Dentition, Missing ?  ?Pulmonary ?neg pulmonary ROS, neg shortness of breath,  ?  ?Pulmonary exam normal ? ? ? ? ? ? ? Cardiovascular ?hypertension, (-) anginaNormal cardiovascular exam ? ? ?  ?Neuro/Psych ?PSYCHIATRIC DISORDERS negative neurological ROS ?   ? GI/Hepatic ?Neg liver ROS, GERD  Controlled,  ?Endo/Other  ?diabetes, Type 2Hypothyroidism  ? Renal/GU ?  ? ?  ?Musculoskeletal ? ? Abdominal ?  ?Peds ? Hematology ?negative hematology ROS ?(+)   ?Anesthesia Other Findings ?Past Medical History: ?No date: Anemia ?No date: Anxiety ?No date: Gallstones ?No date: GERD (gastroesophageal reflux disease) ?No date: Hyperlipidemia ?No date: Hypertension ? ?Past Surgical History: ?04/03/1997: BILATERAL SALPINGECTOMY ?07/07/2015: CHOLECYSTECTOMY; N/A ?    Comment:  Procedure: LAPAROSCOPIC CHOLECYSTECTOMY ;  Surgeon:  ?             Jules Husbands, MD;  Location: [facility] ORS;  Service:  ?             General;  Laterality: N/A; ?08/30/2021: ESOPHAGOGASTRODUODENOSCOPY ?    Comment:  Procedure: ESOPHAGOGASTRODUODENOSCOPY (EGD);  Surgeon:  ?             Lucilla Lame, MD;  Location: [facility] ENDOSCOPY;  Service:  ?             Endoscopy;; ?07/07/2015: GALLBLADDER SURGERY ?    Comment:  [facility] ?08/31/2021: IR BILIARY DRAIN PLACEMENT WITH CHOLANGIOGRAM ?09/02/2021: IR CONVERT BILIARY DRAIN TO INT EXT BILIARY DRAIN ? ?BMI   ? Body Mass Index: 22.95 kg/m?  ?  ? ? Reproductive/Obstetrics ?negative OB ROS ? ?  ? ? ? ? ? ? ? ? ? ? ? ? ? ?  ?  ? ? ? ? ? ? ? ? ?Anesthesia Physical ?Anesthesia Plan ? ?ASA: 3 ? ?Anesthesia Plan: General ETT  and Rapid Sequence  ? ?Post-op Pain Management:   ? ?Induction: Intravenous ? ?PONV Risk Score and Plan: Ondansetron, Dexamethasone, Midazolam and Treatment may vary due to age or medical condition ? ?Airway Management Planned: Oral ETT and Video Laryngoscope Planned ? ?Additional Equipment:  ? ?Intra-op Plan:  ? ?Post-operative Plan: Extubation in OR ? ?Informed Consent: I have reviewed the patients History and Physical, chart, labs and discussed the procedure including the risks, benefits and alternatives for the proposed anesthesia with the patient or authorized representative who has indicated his/her understanding and acceptance.  ? ?Patient has DNR.  ?Discussed DNR with patient and Suspend DNR. ?  ?Dental Advisory Given ? ?Plan Discussed with: Anesthesiologist, CRNA and Surgeon ? ?Anesthesia Plan Comments: (Patient and family consented for risks of anesthesia including but not limited to:  ?- adverse reactions to medications ?- damage to eyes, teeth, lips or other oral mucosa ?- nerve damage due to positioning  ?- sore throat or hoarseness ?- Damage to heart, brain, nerves, lungs, other parts of body or loss of life ? ?They voiced understanding.)  ? ? ? ? ? ? ?Anesthesia Quick Evaluation ? ?

## 2021-09-09 DIAGNOSIS — K831 Obstruction of bile duct: Secondary | ICD-10-CM | POA: Diagnosis not present

## 2021-09-09 LAB — GLUCOSE, CAPILLARY
Glucose-Capillary: 185 mg/dL — ABNORMAL HIGH (ref 70–99)
Glucose-Capillary: 192 mg/dL — ABNORMAL HIGH (ref 70–99)
Glucose-Capillary: 243 mg/dL — ABNORMAL HIGH (ref 70–99)
Glucose-Capillary: 277 mg/dL — ABNORMAL HIGH (ref 70–99)
Glucose-Capillary: 306 mg/dL — ABNORMAL HIGH (ref 70–99)
Glucose-Capillary: 312 mg/dL — ABNORMAL HIGH (ref 70–99)

## 2021-09-09 LAB — BASIC METABOLIC PANEL
Anion gap: 6 (ref 5–15)
BUN: 38 mg/dL — ABNORMAL HIGH (ref 8–23)
CO2: 27 mmol/L (ref 22–32)
Calcium: 9 mg/dL (ref 8.9–10.3)
Chloride: 103 mmol/L (ref 98–111)
Creatinine, Ser: 0.85 mg/dL (ref 0.44–1.00)
GFR, Estimated: >60 mL/min (ref >60)
Glucose, Bld: 321 mg/dL — ABNORMAL HIGH (ref 70–99)
Potassium: 4.1 mmol/L (ref 3.5–5.1)
Sodium: 136 mmol/L (ref 135–145)

## 2021-09-09 LAB — MAGNESIUM: Magnesium: 2.1 mg/dL (ref 1.7–2.4)

## 2021-09-09 MED ORDER — TRAVASOL 10 % IV SOLN
INTRAVENOUS | Status: AC
  Filled 2021-09-09: qty 1036.8

## 2021-09-09 MED ORDER — INSULIN ASPART 100 UNIT/ML IJ SOLN
0.0000 [IU] | INTRAMUSCULAR | Status: DC
  Administered 2021-09-09: 4 [IU] via SUBCUTANEOUS
  Administered 2021-09-09: 11 [IU] via SUBCUTANEOUS
  Administered 2021-09-09: 4 [IU] via SUBCUTANEOUS
  Administered 2021-09-09: 7 [IU] via SUBCUTANEOUS
  Administered 2021-09-10: 3 [IU] via SUBCUTANEOUS
  Administered 2021-09-10 (×2): 4 [IU] via SUBCUTANEOUS
  Administered 2021-09-10 (×2): 7 [IU] via SUBCUTANEOUS
  Administered 2021-09-11: 3 [IU] via SUBCUTANEOUS
  Administered 2021-09-11: 7 [IU] via SUBCUTANEOUS
  Administered 2021-09-11 – 2021-09-12 (×6): 4 [IU] via SUBCUTANEOUS
  Administered 2021-09-12: 3 [IU] via SUBCUTANEOUS
  Administered 2021-09-12: 7 [IU] via SUBCUTANEOUS
  Administered 2021-09-12 (×2): 4 [IU] via SUBCUTANEOUS
  Administered 2021-09-13: 7 [IU] via SUBCUTANEOUS
  Administered 2021-09-13 (×2): 4 [IU] via SUBCUTANEOUS
  Administered 2021-09-13: 7 [IU] via SUBCUTANEOUS
  Administered 2021-09-13: 4 [IU] via SUBCUTANEOUS
  Administered 2021-09-13: 3 [IU] via SUBCUTANEOUS
  Administered 2021-09-14 (×5): 4 [IU] via SUBCUTANEOUS
  Filled 2021-09-09 (×31): qty 1

## 2021-09-09 MED ORDER — ENOXAPARIN SODIUM 40 MG/0.4ML IJ SOSY
40.0000 mg | PREFILLED_SYRINGE | INTRAMUSCULAR | Status: DC
  Administered 2021-09-09 – 2021-09-13 (×5): 40 mg via SUBCUTANEOUS
  Filled 2021-09-09 (×5): qty 0.4

## 2021-09-09 MED ORDER — INSULIN GLARGINE-YFGN 100 UNIT/ML ~~LOC~~ SOLN
42.0000 [IU] | Freq: Every day | SUBCUTANEOUS | Status: DC
  Administered 2021-09-09: 42 [IU] via SUBCUTANEOUS
  Filled 2021-09-09 (×2): qty 0.42

## 2021-09-09 NOTE — Consult Note (Signed)
PHARMACY - TOTAL PARENTERAL NUTRITION CONSULT NOTE  ? ?Indication: Small bowel obstruction ? ?Patient Measurements: ?Height: 5' 3.5" (161.3 cm) ?Weight: 59.7 kg (131 lb 9.8 oz) ?IBW/kg (Calculated) : 53.55 ?TPN AdjBW (KG): 59.7 ?Body mass index is 22.95 kg/m?. ?Usual Weight: 64 kg ? ?Assessment:  ?66 years old female with PMH significant for diabetes, hypertension, hypothyroidism, prior cholecystectomy sent to [facility] ED 08/29/21 from PCP due to elevated liver enzymes. 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 Patient intolerant to NG tube with increased emesis ? ?Glucose / Insulin: BG 221-358; Semglee 30u + 0-15u Q6H; required 37 u insulin past 24 hrs ?Electrolytes: potassium, magnesium trending up ?Renal: BUN 29 ?Hepatic: AST 73, ALT 122, T Bili 3.9, Albumin 3.0, Protein 6.2 ?Intake / Output; MIVF: +961 mL ?GI Imaging: ?3/20: Suspected masslike duodenal wall thickening extending to the ampulla, worrisome for duodenal/ampullary carcinoma ? ?3/27: Percutaneous internal/external biliary drain  positioning is not significantly changed  ?GI Surgeries / Procedures:  ?3/21: Endoscopy ?3/22: external biliary drain placement  ? ? ?Central access: PICC line planned 09/03/21 ?TPN start date: 09/03/21 ? ?Nutritional Goals: ?Goal TPN rate is 90 mL/hr (provides 104 g of protein and 2186 kcals per day) ? ?RD Assessment: ?Estimated Needs ?Total Energy Estimated Needs: 2000-2200 kcal ?Total Protein Estimated Needs: 100-110 grams ?Total Fluid Estimated Needs: >/= 2 L/day ? ?Current Nutrition:  ?NPO ? ?Plan:  ?Continue TPN at goal rate of 90 mL/hr at 1800. Per surg, pt likely to go home on TPN.  ?Nutritional components ?Amino acids (using 10% Travasol) 103.7 grams ?Dextrose: 324 grams ?Lipids (using 20% SMOFlipids) 67 grams ?kCal 2186 ?Electrolytes in TPN: Na 52mq/L, K 241m/L, Ca 62m54mL, Mg 0mE69m, and Phos15 mmol/L. Cl:Ac 1:1 ?Add standard MVI to  TPN ?Reduce trace elements to 1/2 due to elevated t bili and jaundice ?Add thiamine 100 mg to TPN x 5 days (day 5/5) ?BG remains elevated. MD increased insulin glargine to 42 units.  ?Increase to 50 units regular insulin in TPN ?Adjust SSI to resistant q4h SSI and adjust as needed  ?Monitor TPN labs on Mon/Thurs, daily until stable ? ?RodnDallie PilesarmD ?Clinical Pharmacist ?09/09/2021 ?7:28 AM ? ? ?

## 2021-09-09 NOTE — Progress Notes (Signed)
Pts NGT came out at 0545. This RN called and spoke with surgeon on call, Lysle Pearl, to verify if he wanted it replaced. Lysle Pearl said to leave it out for now and let dayshift surgeon decide if he wants it put back in or not. Pt has only had 50cc of brown output for the entire shift.   ?

## 2021-09-09 NOTE — Progress Notes (Signed)
[facility] ?SURGICAL PROGRESS NOTE ? ?Hospital Day(s): 11.  ? ?Post op day(s): 1 Day Post-Op.  ? ?Interval History:  ?Patient seen and examined ?No acute events or new complaints overnight.  ?Patient reports she has incisional soreness ?No fever, chills, nausea, emesis ?sCr - 0.85; UO - unmeasured ?No significant electrolyte derangements ?Hyperglycemia to 321 ?NGT unfortunately came out early this morning ?Biliary drain with 260 ccs out; bilious ?She is NPO; Continues on TPN ? ?Vital signs in last 24 hours: [min-max] current  ?Temp:  [97.9 ?F (36.6 ?C)-99.2 ?F (37.3 ?C)] 98.2 ?F (36.8 ?C) (03/31 0424) ?Pulse Rate:  [74-101] 74 (03/31 0424) ?Resp:  [16-20] 16 (03/31 0424) ?BP: (118-147)/(78-88) 137/82 (03/31 0424) ?SpO2:  [93 %-99 %] 99 % (03/31 0424) ?Weight:  [59.7 kg] 59.7 kg (03/30 1652)     Height: 5' 3.5" (161.3 cm) Weight: 59.7 kg BMI (Calculated): 22.95  ? ?Intake/Output last 2 shifts:  ?03/30 0701 - 03/31 0700 ?In: 2316.4 [I.V.:1716.4; IV [id number]:016] ?Out: 2455 [Urine:40; Emesis/NG output:50; Drains:260; Blood:5]  ? ?Physical Exam:  ?Constitutional: alert, cooperative and no distress  ?Respiratory: breathing non-labored at rest  ?Cardiovascular: regular rate and sinus rhythm  ?Gastrointestinal: soft, incisional soreness, and non-distended, no rebound/guarding. Biliary drain present; output remains bilious ?Musculoskeletal: no edema or wounds, motor and sensation grossly intact, NT  ? ?Labs:  ? ?  Latest Ref Rng & Units 09/02/2021  ?  3:47 AM 09/01/2021  ?  4:11 AM 08/31/2021  ?  4:26 AM  ?CBC  ?WBC 4.0 - 10.5 K/uL 10.4   11.1   9.3    ?Hemoglobin 12.0 - 15.0 g/dL 14.3   13.8   13.4    ?Hematocrit 36.0 - 46.0 % 42.3   41.4   39.6    ?Platelets 150 - 400 K/uL 274   260   241    ? ? ?  Latest Ref Rng & Units 09/09/2021  ?  4:48 AM 09/08/2021  ?  3:19 AM 09/07/2021  ?  3:40 AM  ?CMP  ?Glucose 70 - 99 mg/dL 321   283   337    ?BUN 8 - 23 mg/dL 38   34   21    ?Creatinine 0.44 - 1.00 mg/dL 0.85    0.66   0.51    ?Sodium 135 - 145 mmol/L 136   135   133    ?Potassium 3.5 - 5.1 mmol/L 4.1   4.6   4.2    ?Chloride 98 - 111 mmol/L 103   96   95    ?CO2 22 - 32 mmol/L '27   29   28    '$ ?Calcium 8.9 - 10.3 mg/dL 9.0   10.4   10.1    ?Total Protein 6.5 - 8.1 g/dL  7.7     ?Total Bilirubin 0.3 - 1.2 mg/dL  2.9     ?Alkaline Phos 38 - 126 U/L  95     ?AST 15 - 41 U/L  41     ?ALT 0 - 44 U/L  74     ? ? ? ?Imaging studies: No new pertinent imaging studies ? ? ?Assessment/Plan:  ?66 y.o. 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 ? - She needs to remain NPO; likely for 3-5 days to allow for anastomotic healing. Anticipate UGI study, likely Monday 04/03, prior to initiating diet  ? -  NGT did come out overnight; she is doing well without this. I think we can hold off on replacing this for now. If she does develop significant nausea, this may need to be replaced. Would recommend placing under fluoroscopy with IR secondary to recent gastric anastomosis.  ? - Continue TPN; monitor electrolytes  ?         - Monitor abdominal examination; on-going bowel function  ?         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 ?All of the above findings and recommendations were discussed with the patient, patient's family (daughter at bedside), and the medical team, and all of patient's and family's questions were answered to their expressed satisfaction. ? ?-- ?Edison Simon, PA-C ?[facility] ?09/09/2021, 8:17 AM ?[phone] ?M-F: 7am - 4pm ? ?

## 2021-09-09 NOTE — Telephone Encounter (Signed)
Noted. Let's make sure she gets a hospital f/u with our office as well.

## 2021-09-09 NOTE — Progress Notes (Signed)
Inpatient Diabetes Program Recommendations ? ?AACE/ADA: New Consensus Statement on Inpatient Glycemic Control (2015) ? ?Target Ranges:  Prepandial:   less than 140 mg/dL ?     Peak postprandial:   less than 180 mg/dL (1-2 hours) ?     Critically ill patients:  140 - 180 mg/dL  ? ? Latest Reference Range & Units 09/07/21 23:46 09/08/21 05:21 09/08/21 12:02 09/08/21 17:01  ?Glucose-Capillary 70 - 99 mg/dL 325 (H) ? ?11 units Novolog ? 303 (H) ? ?11 units Novolog ? 358 (H) ? ?15 units Novolog ? ?35 units Semglee '@1010'$  221 (H) ? ? ? ?10 mg Decadron '@1804'$   ? ? Latest Reference Range & Units 09/09/21 00:00 09/09/21 05:10  ?Glucose-Capillary 70 - 99 mg/dL 312 (H) ? ?11 units Novolog ? 306 (H) ? ?11 units Novolog ?  ? ? ?Admit with:  ?Obstructive jaundice ?Metastatic breast cancer ([place]) ?Gastric outlet obstruction ?  ?History: DM2 ? ?Home DM Meds: None ?  ?Current Orders: Novolog 0-15 units Q6H ?   Semglee 35 units daily ?   TPN @ 90 ml/hr + 40 units Insulin included in solution ?  ?  ?  ?To discharge home with Insulin + Freestyle Libre 2 CGM ?  ?Diabetes RN to provide Freestyle Libre 2 CGM to pt prior to discharge ? ? ? ?MD- Note Pt received Decadron X 1 dose last PM during surgery--CBGs remain >300 since Midnight.  Pt received total of 37 units Novolog yesterday (no SSI given at 6pm b/c pt was in surgery) + 35 units Semglee + the Insulin in the TPN solution ? ?1. If plan is to continue TPN at 90cc/hr,  would consider increasing the Insulin in the TPN to 60 units  ? ?2. Consider increasing the Semglee slightly more to 40 units Daily ? ?3. May also consider increasing the Novolog SSI to the 0-20 unit scale (could increase the frequency to Q4 hours so pt will get 2 more doses Novolog per day) ? ? ? ?--Will follow patient during hospitalization-- ? ?Wyn Quaker RN, MSN, CDE ?Diabetes Coordinator ?Inpatient Glycemic Control Team ?Team Pager: [phone] (8a-5p) ? ?

## 2021-09-09 NOTE — Progress Notes (Signed)
?PROGRESS NOTE ? ? ? ?Diamond Hinton  RWE:[id number] DOB: 02-Nov-1955 DOA: 08/29/2021 ?PCP: Virginia Crews, MD  ? ? ?Brief Narrative:  ?This 66 years old female with PMH significant for diabetes, hypertension, hypothyroidism, prior cholecystectomy sent by her PCP with abnormal labs notably elevated liver enzymes.  Patient reports having gastric reflux symptoms for about a month and has been receiving treatment with famotidine, sucralfate but her symptoms had not improved.  Work-up in the ED shows elevated AST ALT and total bilirubin, lipase 395, UA: Large LE.  CT abdomen and pelvis shows marked distention of the stomach finding suggestive of possible benign or malignant stricture.  Endoscopic evaluation recommended.  Left breast mass with slightly spiculated margins consider outpatient mammogram.   ?  ?GI was consulted.  They attempted ERCP on 3/21, but unable to proceed due to food and liquid residue in the stomach.  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   ?  ?IR consulted for biliary drain placement on 3/22, but unable to advance drain past sphincter of Oddi. ?Returned to IR on 3/24 for advancement of drain.   ?  ?Preliminary biopsy results showing this is likely metastatic breast cancer.  Surgery consulted for consideration of a J tube for nutrition.  Oncology also consulted. ? ?3/30 overnight had vomiting.  Nausea is better controlled this a.m. specially when she sits up.  No new complaints ? ?3/31 NGT popped out by itself this am.  ? ? ?Consultants:  ?Oncology surgery ? ?Procedures:  ? ?Antimicrobials:  ?  ? ? ?Subjective: ?Has no nasuea so far. No abd pain. No vomiting ? ? ?Objective: ?Vitals:  ? 09/08/21 2100 09/08/21 2126 09/09/21 0424 09/09/21 6195  ?BP: (!) 146/78 (!) 142/82 137/82 130/75  ?Pulse: 90 89 74 66  ?Resp: '20 18 16 18  '$ ?Temp: 98.7 ?F (37.1 ?C) 98.1 ?F (36.7 ?C) 98.2 ?F (36.8 ?C) 98.5 ?F (36.9 ?C)  ?TempSrc:    Oral  ?SpO2: 94% 95% 99% 100%  ?Weight:       ?Height:      ? ? ?Intake/Output Summary (Last 24 hours) at 09/09/2021 1413 ?Last data filed at 09/09/2021 [phone] ?Gross per 24 hour  ?Intake 2316.43 ml  ?Output 2455 ml  ?Net -138.57 ml  ? ?Filed Weights  ? 08/29/21 1706 09/03/21 1200 09/08/21 1652  ?Weight: 64 kg 59.7 kg 59.7 kg  ? ? ?Examination: ?Calm, NAD ?Cta no w/r ?Reg s1/s2 no gallop ?Soft benign +bs ?No edema ?Aaoxox3  ?Mood and affect appropriate in current setting  ? ? ? ?Data Reviewed: I have personally reviewed following labs and imaging studies ? ?CBC: ?No results for input(s): WBC, NEUTROABS, HGB, HCT, MCV, PLT in the last 168 hours. ? ?Basic Metabolic Panel: ?Recent Labs  ?Lab 09/04/21 ?0427 09/05/21 ?6712 09/06/21 ?4580 09/07/21 ?0340 09/08/21 ?9983 09/09/21 ?0448  ?NA 141 135 138 133* 135 136  ?K 3.7 3.7 4.1 4.2 4.6 4.1  ?CL 107 103 106 95* 96* 103  ?CO2 '28 23 23 28 29 27  '$ ?GLUCOSE 221* 241* 289* 337* 283* 321*  ?BUN 30* '22 22 21 '$ 34* 38*  ?CREATININE 0.64 0.55 0.67 0.51 0.66 0.85  ?CALCIUM 8.4* 8.6* 9.1 10.1 10.4* 9.0  ?MG 2.5* 2.2 2.1 2.3 2.4 2.1  ?PHOS 2.4* 2.1* 3.3 3.9 4.6  --   ? ?GFR: ?Estimated Creatinine Clearance: 54.3 mL/min (by C-G formula based on SCr of 0.85 mg/dL). ?Liver Function Tests: ?Recent Labs  ?Lab 09/03/21 ?3825 09/04/21 ?  7893 09/05/21 ?8101 09/06/21 ?7510 09/08/21 ?0319  ?AST 100* 73* 54* 38 41  ?ALT 149* 122* 103* 86* 74*  ?ALKPHOS 125 99 94 87 95  ?BILITOT 4.9* 3.9* 3.2* 2.7* 2.9*  ?PROT 6.4* 6.2* 6.3* 6.7 7.7  ?ALBUMIN 3.1* 3.0* 2.9* 3.2* 3.3*  ? ?No results for input(s): LIPASE, AMYLASE in the last 168 hours. ?No results for input(s): AMMONIA in the last 168 hours. ?Coagulation Profile: ?No results for input(s): INR, PROTIME in the last 168 hours. ?Cardiac Enzymes: ?No results for input(s): CKTOTAL, CKMB, CKMBINDEX, TROPONINI in the last 168 hours. ?BNP (last 3 results) ?No results for input(s): PROBNP in the last 8760 hours. ?HbA1C: ?No results for input(s): HGBA1C in the last 72 hours. ?CBG: ?Recent Labs  ?Lab  09/08/21 ?1202 09/08/21 ?1701 09/09/21 ?0000 09/09/21 ?0510 09/09/21 ?1150  ?GLUCAP 358* 221* 312* 306* 277*  ? ?Lipid Profile: ?No results for input(s): CHOL, HDL, LDLCALC, TRIG, CHOLHDL, LDLDIRECT in the last 72 hours. ? ?Thyroid Function Tests: ?No results for input(s): TSH, T4TOTAL, FREET4, T3FREE, THYROIDAB in the last 72 hours. ?Anemia Panel: ?No results for input(s): VITAMINB12, FOLATE, FERRITIN, TIBC, IRON, RETICCTPCT in the last 72 hours. ?Sepsis Labs: ?No results for input(s): PROCALCITON, LATICACIDVEN in the last 168 hours. ? ?Recent Results (from the past 240 hour(s))  ?Aerobic/Anaerobic Culture w Gram Stain (surgical/deep wound)     Status: None  ? Collection Time: 08/31/21  2:31 PM  ? Specimen: BILE  ?Result Value Ref Range Status  ? Specimen Description   Final  ?  BILE ?Performed at [facility], [address], [place], [place] [address] ?  ? Special Requests   Final  ?  NONE ?Performed at [facility], [address], [place], [place] [address] ?  ? Gram Stain NO WBC SEEN ?NO ORGANISMS SEEN ?  Final  ? Culture   Final  ?  No growth aerobically or anaerobically. ?Performed at [facility], [place] [address] [place], [place] [address] ?  ? Report Status 09/05/2021 FINAL  Final  ?  ? ? ? ? ? ?Radiology Studies: ?No results found. ? ? ? ? ? ?Scheduled Meds: ? Chlorhexidine Gluconate Cloth  6 each Topical Daily  ? enoxaparin (LOVENOX) injection  40 mg Subcutaneous Q24H  ? hydrochlorothiazide  25 mg Oral Daily  ? insulin aspart  0-20 Units Subcutaneous Q4H  ? insulin glargine-yfgn  42 Units Subcutaneous Daily  ? levothyroxine  75 mcg Oral Q0600  ? lidocaine-EPINEPHrine  10 mL Intradermal Once  ? lidocaine-EPINEPHrine  1.7 mL Intradermal Once  ? pantoprazole (PROTONIX) IV  40 mg Intravenous Q12H  ? scopolamine  1 patch Transdermal Q72H  ? sodium chloride flush  10-40 mL Intracatheter Q12H  ? sodium chloride flush  5 mL Intracatheter Q8H  ? ?Continuous Infusions: ? sodium chloride  Stopped (09/08/21 1936)  ? TPN ADULT (ION) 90 mL/hr at 09/09/21 0525  ? TPN ADULT (ION)    ? ? ?Assessment & Plan: ?  ?Principal Problem: ?  Obstructive jaundice ?Active Problems: ?  Metastatic breast cancer ([place]) ?  Gastric outlet obstruction ?  Elevated lipase ?  Abnormal LFTs ?  UTI (urinary tract infection) ?  History of cholecystectomy ?  Uncontrolled type 2 diabetes mellitus with hyperglycemia, without long-term current use of insulin ([place]) ?  Adult hypothyroidism ?  Hypertension ?  Hypokalemia ?  Stenosis of duodenum ?  Constipation ?  Hypophosphatemia ?  Goals of care, counseling/discussion ?  Palliative care  encounter ? ?Obstructive jaundice ?Presented with jaundice, with history of prior cholecystectomy, with elevated LFTs, lipase. ?GI was consulted and recommended ERCP. ?ERCP was attempted on 3/21 but unable to be performed due to food and liquid residue within the stomach.  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 ?3/31continue TPN ? ? ? ? ? ? ?Gastric outlet obstruction ?Due to metastatic breast cancer. ?MRCP on 3/21 showed masslike duodenal wall thickening extending to the ampulla, worrisome for duodenal/ampullary carcinoma. ...intrahepatic/extrahepatic ductal dilatation.... prominent pancreatic duct, extending to the ampulla... 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   ?--2.3 cm irregular/spiculated lesion in the left inferior breast, suspicious for primary breast neoplasm.  ?3/29 no surgical intervention ?Biopsy positive for adenocarcinoma with signet ring cells; awaiting breast biopsy result ?Biliary drain placed by IR on 3/22 ?Now tolerating clears and TPN ?PICC line placed on 3/25 and TPN started ?3/31 status post gastrojejunostomy on 3/30 ?Needed NG tube for 3 days however popped out.  Continue NPO. ?Surgery following.  If she does develop significant nausea may need to replace NG tube.  Would recommend placing it  under fluoroscopy with IR secondary to recent gastric anastomosis. ? ? ? ? ? ?  ?  ?Elevated lipase ?No focal abnormality seen in the pancreas on CT.  Suspect related to obstructive process. ?3/21 biliary drain placed by

## 2021-09-09 NOTE — Telephone Encounter (Signed)
Patient is currently admitted at [facility].  ?

## 2021-09-09 NOTE — Anesthesia Postprocedure Evaluation (Signed)
Anesthesia Post Note ? ?Patient: YANICE MAQUEDA ? ?Procedure(s) Performed: XI [place]; GASTRO JEJUNOSTOMY ? ?Patient location during evaluation: PACU ?Anesthesia Type: General ?Level of consciousness: awake and alert ?Pain management: pain level controlled ?Vital Signs Assessment: post-procedure vital signs reviewed and stable ?Respiratory status: spontaneous breathing, nonlabored ventilation, respiratory function stable and patient connected to nasal cannula oxygen ?Cardiovascular status: blood pressure returned to baseline and stable ?Postop Assessment: no apparent nausea or vomiting ?Anesthetic complications: no ? ? ?No notable events documented. ? ? ?Last Vitals:  ?Vitals:  ? 09/08/21 2126 09/09/21 0424  ?BP: (!) 142/82 137/82  ?Pulse: 89 74  ?Resp: 18 16  ?Temp: 36.7 ?C 36.8 ?C  ?SpO2: 95% 99%  ?  ?Last Pain:  ?Vitals:  ? 09/08/21 2100  ?TempSrc:   ?PainSc: 0-No pain  ? ? ?  ?  ?  ?  ?  ?  ? ?Precious Haws Anea Fodera ? ? ? ? ?

## 2021-09-10 DIAGNOSIS — K831 Obstruction of bile duct: Secondary | ICD-10-CM | POA: Diagnosis not present

## 2021-09-10 LAB — GLUCOSE, CAPILLARY
Glucose-Capillary: 150 mg/dL — ABNORMAL HIGH (ref 70–99)
Glucose-Capillary: 160 mg/dL — ABNORMAL HIGH (ref 70–99)
Glucose-Capillary: 160 mg/dL — ABNORMAL HIGH (ref 70–99)
Glucose-Capillary: 183 mg/dL — ABNORMAL HIGH (ref 70–99)
Glucose-Capillary: 229 mg/dL — ABNORMAL HIGH (ref 70–99)
Glucose-Capillary: 235 mg/dL — ABNORMAL HIGH (ref 70–99)

## 2021-09-10 MED ORDER — INSULIN GLARGINE-YFGN 100 UNIT/ML ~~LOC~~ SOLN
4.0000 [IU] | Freq: Once | SUBCUTANEOUS | Status: AC
  Administered 2021-09-10: 4 [IU] via SUBCUTANEOUS
  Filled 2021-09-10: qty 0.04

## 2021-09-10 MED ORDER — TRAVASOL 10 % IV SOLN
INTRAVENOUS | Status: AC
  Filled 2021-09-10: qty 1036.8

## 2021-09-10 MED ORDER — INSULIN GLARGINE-YFGN 100 UNIT/ML ~~LOC~~ SOLN
46.0000 [IU] | Freq: Every day | SUBCUTANEOUS | Status: DC
  Administered 2021-09-10 – 2021-09-14 (×5): 46 [IU] via SUBCUTANEOUS
  Filled 2021-09-10 (×5): qty 0.46

## 2021-09-10 NOTE — Progress Notes (Signed)
CC: gastric outlet obstruction ?Subjective: ?Doing well.  NG tube is out.  No fevers no chills no nausea no vomiting.  Feels much better as compared to preoperatively ? ?Objective: ?Vital signs in last 24 hours: ?Temp:  [97.9 ?F (36.6 ?C)-98.5 ?F (36.9 ?C)] 98.5 ?F (36.9 ?C) (04/01 1957) ?Pulse Rate:  [65-73] 65 (04/01 1957) ?Resp:  [16-18] 18 (04/01 1957) ?BP: (127-149)/(76-85) 136/78 (04/01 1957) ?SpO2:  [98 %-99 %] 99 % (04/01 1957) ?Last BM Date : 10/09/21 ? ?Intake/Output from previous day: ?03/31 0701 - 04/01 0700 ?In: 620.2 [I.V.:605.2] ?Out: 400 [Drains:400] ?Intake/Output this shift: ?No intake/output data recorded. ? ?Physical exam: ?NAD  ?Abd: soft, incision c/d/I, no infection or peritonitis ? ?Lab Results: ?CBC  ?No results for input(s): WBC, HGB, HCT, PLT in the last 72 hours. ?BMET ?Recent Labs  ?  09/08/21 ?6948 09/09/21 ?0448  ?NA 135 136  ?K 4.6 4.1  ?CL 96* 103  ?CO2 29 27  ?GLUCOSE 283* 321*  ?BUN 34* 38*  ?CREATININE 0.66 0.85  ?CALCIUM 10.4* 9.0  ? ?PT/INR ?No results for input(s): LABPROT, INR in the last 72 hours. ?ABG ?No results for input(s): PHART, HCO3 in the last 72 hours. ? ?Invalid input(s): PCO2, PO2 ? ?Studies/Results: ?No results found. ? ?Anti-infectives: ?Anti-infectives (From admission, onward)  ? ? Start     Dose/Rate Route Frequency Ordered Stop  ? 09/08/21 1800  cefoTEtan (CEFOTAN) 2 g in sodium chloride 0.9 % 100 mL IVPB       ? 2 g ?200 mL/hr over 30 Minutes Intravenous  Once 09/08/21 1700 09/08/21 1752  ? 09/08/21 1654  sodium chloride 0.9 % with cefoTEtan (CEFOTAN) ADS Med       ?Note to Pharmacy: Register, Santiago Glad A: cabinet override  ?    09/08/21 1654 09/08/21 1742  ? 08/31/21 1730  piperacillin-tazobactam (ZOSYN) IVPB 3.375 g  Status:  Discontinued       ? 3.375 g ?12.5 mL/hr over 240 Minutes Intravenous Every 8 hours 08/31/21 1641 09/06/21 1200  ? 08/31/21 1400  cefTRIAXone (ROCEPHIN) 2 g in sodium chloride 0.9 % 100 mL IVPB       ? 2 g ?200 mL/hr over 30 Minutes  Intravenous To Radiology 08/31/21 1306 09/01/21 1400  ? 08/31/21 1358  cefTRIAXone (ROCEPHIN) injection       ?  Intravenous As needed 08/31/21 1358 08/31/21 1358  ? 08/29/21 1945  cefTRIAXone (ROCEPHIN) 1 g in sodium chloride 0.9 % 100 mL IVPB       ? 1 g ?200 mL/hr over 30 Minutes Intravenous  Once 08/29/21 1940 08/29/21 2024  ? ?  ? ? ?Assessment/Plan: ? ?POD # 2 doing well.  We will keep NPO. ?Mobilize ?Continue TPN. ?No evidence of complications ? ?Caroleen Hamman, MD, FACS ? ?09/10/2021 ? ? ? ?  ?

## 2021-09-10 NOTE — Consult Note (Signed)
PHARMACY - TOTAL PARENTERAL NUTRITION CONSULT NOTE  ? ?Indication: Small bowel obstruction ? ?Patient Measurements: ?Height: 5' 3.5" (161.3 cm) ?Weight: 59.7 kg (131 lb 9.8 oz) ?IBW/kg (Calculated) : 53.55 ?TPN AdjBW (KG): 59.7 ?Body mass index is 22.95 kg/m?. ?Usual Weight: 64 kg ? ?Assessment:  ?66 years old female with PMH significant for diabetes, hypertension, hypothyroidism, prior cholecystectomy sent to [facility] ED 08/29/21 from PCP due to elevated liver enzymes. 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 Patient intolerant to NG tube with increased emesis ? ?Glucose / Insulin: BG 235; Semglee 42u + 0-15u Q6H; required 33 u insulin past 24 hrs ?Electrolytes: potassium, magnesium trending up ?Renal: BUN 29 ?Hepatic: AST 73, ALT 122, T Bili 3.9, Albumin 3.0, Protein 6.2 ?Intake / Output; MIVF: +370 mL ?GI Imaging: ?3/20: Suspected masslike duodenal wall thickening extending to the ampulla, worrisome for duodenal/ampullary carcinoma ? ?3/27: Percutaneous internal/external biliary drain  positioning is not significantly changed  ?GI Surgeries / Procedures:  ?3/21: Endoscopy ?3/22: external biliary drain placement  ? ? ?Central access: PICC line planned 09/03/21 ?TPN start date: 09/03/21 ? ?Nutritional Goals: ?Goal TPN rate is 90 mL/hr (provides 104 g of protein and 2186 kcals per day) ? ?RD Assessment: ?Estimated Needs ?Total Energy Estimated Needs: 2000-2200 kcal ?Total Protein Estimated Needs: 100-110 grams ?Total Fluid Estimated Needs: >/= 2 L/day ? ?Current Nutrition:  ?NPO ? ?Plan:  ?Continue TPN at goal rate of 90 mL/hr at 1800. Per surg, pt likely to go home on TPN.  ?Nutritional components ?Amino acids (using 10% Travasol) 103.7 grams ?Dextrose: 324 grams ?Lipids (using 20% SMOFlipids) 67 grams ?kCal 2186 ?Electrolytes in TPN: Na 67mq/L, K 259m/L, Ca 67m37mL, Mg 0mE667m, and Phos15 mmol/L. Cl:Ac 1:1 ?Add standard MVI to TPN ?Reduce  trace elements to 1/2 due to elevated t bili and jaundice ?Completed thiamine 100 mg to TPN x 5 days  ?BG remains elevated. MD increased insulin glargine to 46 units.  ?Increase to 50 units regular insulin in TPN ?Adjust SSI to resistant q4h SSI and adjust as needed  ?Monitor TPN labs on Mon/Thurs, daily until stable ? ?KishOswald HillockarmD ?Clinical Pharmacist ?09/10/2021 ?10:07 AM ? ? ?

## 2021-09-10 NOTE — Progress Notes (Signed)
Dressing change refused by patient ?

## 2021-09-10 NOTE — Progress Notes (Signed)
?PROGRESS NOTE ? ? ? ?Diamond Hinton  NKN:[id number] DOB: Mar 22, 1956 DOA: 08/29/2021 ?PCP: Virginia Crews, MD  ? ? ?Brief Narrative:  ?This 66 years old female with PMH significant for diabetes, hypertension, hypothyroidism, prior cholecystectomy sent by her PCP with abnormal labs notably elevated liver enzymes.  Patient reports having gastric reflux symptoms for about a month and has been receiving treatment with famotidine, sucralfate but her symptoms had not improved.  Work-up in the ED shows elevated AST ALT and total bilirubin, lipase 395, UA: Large LE.  CT abdomen and pelvis shows marked distention of the stomach finding suggestive of possible benign or malignant stricture.  Endoscopic evaluation recommended.  Left breast mass with slightly spiculated margins consider outpatient mammogram.   ?  ?GI was consulted.  They attempted ERCP on 3/21, but unable to proceed due to food and liquid residue in the stomach.  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   ?  ?IR consulted for biliary drain placement on 3/22, but unable to advance drain past sphincter of Oddi. ?Returned to IR on 3/24 for advancement of drain.   ?  ?Preliminary biopsy results showing this is likely metastatic breast cancer.  Surgery consulted for consideration of a J tube for nutrition.  Oncology also consulted. ? ?3/30 overnight had vomiting.  Nausea is better controlled this a.m. specially when she sits up.  No new complaints ? ?3/31 NGT popped out by itself this am.  ?4/1 no nausea or vomiting.  Tolerating so far being without NG tube.  BG improving ? ? ?Consultants:  ?Oncology surgery ? ?Procedures:  ? ?Antimicrobials:  ?  ? ? ?Subjective: ?No vomiting, abdominal soreness but not pain passing gas. ? ?Objective: ?Vitals:  ? 09/09/21 0833 09/09/21 1542 09/09/21 1957 09/10/21 0411  ?BP: 130/75 137/69 137/75 (!) 149/85  ?Pulse: 66 67 69 73  ?Resp: '18 18 20 16  '$ ?Temp: 98.5 ?F (36.9 ?C) 98.2 ?F (36.8 ?C) 98.9 ?F  (37.2 ?C) 98.4 ?F (36.9 ?C)  ?TempSrc: Oral Oral Oral Oral  ?SpO2: 100% 99% 98% 98%  ?Weight:      ?Height:      ? ? ?Intake/Output Summary (Last 24 hours) at 09/10/2021 0814 ?Last data filed at 09/10/2021 3790 ?Gross per 24 hour  ?Intake 620.23 ml  ?Output 400 ml  ?Net 220.23 ml  ? ?Filed Weights  ? 08/29/21 1706 09/03/21 1200 09/08/21 1652  ?Weight: 64 kg 59.7 kg 59.7 kg  ? ? ?Examination: ?Calm, NAD ?Cta no w/r ?Reg s1/s2 no gallop ?Soft distended dressing in place ?No edema ?Aaoxox3  ?Mood and affect appropriate in current setting  ? ? ? ?Data Reviewed: I have personally reviewed following labs and imaging studies ? ?CBC: ?No results for input(s): WBC, NEUTROABS, HGB, HCT, MCV, PLT in the last 168 hours. ? ?Basic Metabolic Panel: ?Recent Labs  ?Lab 09/04/21 ?0427 09/05/21 ?2409 09/06/21 ?7353 09/07/21 ?0340 09/08/21 ?2992 09/09/21 ?0448  ?NA 141 135 138 133* 135 136  ?K 3.7 3.7 4.1 4.2 4.6 4.1  ?CL 107 103 106 95* 96* 103  ?CO2 '28 23 23 28 29 27  '$ ?GLUCOSE 221* 241* 289* 337* 283* 321*  ?BUN 30* '22 22 21 '$ 34* 38*  ?CREATININE 0.64 0.55 0.67 0.51 0.66 0.85  ?CALCIUM 8.4* 8.6* 9.1 10.1 10.4* 9.0  ?MG 2.5* 2.2 2.1 2.3 2.4 2.1  ?PHOS 2.4* 2.1* 3.3 3.9 4.6  --   ? ?GFR: ?Estimated Creatinine Clearance: 54.3 mL/min (by C-G formula based  on SCr of 0.85 mg/dL). ?Liver Function Tests: ?Recent Labs  ?Lab 09/04/21 ?0427 09/05/21 ?7902 09/06/21 ?4097 09/08/21 ?0319  ?AST 73* 54* 38 41  ?ALT 122* 103* 86* 74*  ?ALKPHOS 99 94 87 95  ?BILITOT 3.9* 3.2* 2.7* 2.9*  ?PROT 6.2* 6.3* 6.7 7.7  ?ALBUMIN 3.0* 2.9* 3.2* 3.3*  ? ?No results for input(s): LIPASE, AMYLASE in the last 168 hours. ?No results for input(s): AMMONIA in the last 168 hours. ?Coagulation Profile: ?No results for input(s): INR, PROTIME in the last 168 hours. ?Cardiac Enzymes: ?No results for input(s): CKTOTAL, CKMB, CKMBINDEX, TROPONINI in the last 168 hours. ?BNP (last 3 results) ?No results for input(s): PROBNP in the last 8760 hours. ?HbA1C: ?No results for input(s):  HGBA1C in the last 72 hours. ?CBG: ?Recent Labs  ?Lab 09/09/21 ?1543 09/09/21 ?1957 09/09/21 ?2344 09/10/21 ?0410 09/10/21 ?0750  ?GLUCAP 243* 185* 192* 229* 235*  ? ?Lipid Profile: ?No results for input(s): CHOL, HDL, LDLCALC, TRIG, CHOLHDL, LDLDIRECT in the last 72 hours. ? ?Thyroid Function Tests: ?No results for input(s): TSH, T4TOTAL, FREET4, T3FREE, THYROIDAB in the last 72 hours. ?Anemia Panel: ?No results for input(s): VITAMINB12, FOLATE, FERRITIN, TIBC, IRON, RETICCTPCT in the last 72 hours. ?Sepsis Labs: ?No results for input(s): PROCALCITON, LATICACIDVEN in the last 168 hours. ? ?Recent Results (from the past 240 hour(s))  ?Aerobic/Anaerobic Culture w Gram Stain (surgical/deep wound)     Status: None  ? Collection Time: 08/31/21  2:31 PM  ? Specimen: BILE  ?Result Value Ref Range Status  ? Specimen Description   Final  ?  BILE ?Performed at [facility], [address], [place], [place] [address] ?  ? Special Requests   Final  ?  NONE ?Performed at [facility], [address] [place], [place] [address] ?  ? Gram Stain NO WBC SEEN ?NO ORGANISMS SEEN ?  Final  ? Culture   Final  ?  No growth aerobically or anaerobically. ?Performed at [facility], [place] [address] [place], [place] [address] ?  ? Report Status 09/05/2021 FINAL  Final  ?  ? ? ? ? ? ?Radiology Studies: ?No results found. ? ? ? ? ? ?Scheduled Meds: ? Chlorhexidine Gluconate Cloth  6 each Topical Daily  ? enoxaparin (LOVENOX) injection  40 mg Subcutaneous Q24H  ? hydrochlorothiazide  25 mg Oral Daily  ? insulin aspart  0-20 Units Subcutaneous Q4H  ? insulin glargine-yfgn  42 Units Subcutaneous Daily  ? levothyroxine  75 mcg Oral Q0600  ? lidocaine-EPINEPHrine  10 mL Intradermal Once  ? lidocaine-EPINEPHrine  1.7 mL Intradermal Once  ? pantoprazole (PROTONIX) IV  40 mg Intravenous Q12H  ? scopolamine  1 patch Transdermal Q72H  ? sodium chloride flush  10-40 mL Intracatheter Q12H  ? sodium chloride flush  5 mL  Intracatheter Q8H  ? ?Continuous Infusions: ? sodium chloride Stopped (09/08/21 1936)  ? TPN ADULT (ION) 90 mL/hr at 09/09/21 1716  ? ? ?Assessment & Plan: ?  ?Principal Problem: ?  Obstructive jaundice ?Active Problems: ?  Metastatic breast cancer ?  Gastric outlet obstruction ?  Elevated lipase ?  Abnormal LFTs ?  UTI (urinary tract infection) ?  History of cholecystectomy ?  Uncontrolled type 2 diabetes mellitus with hyperglycemia, without long-term current use of insulin ([place]) ?  Adult hypothyroidism ?  Hypertension ?  Hypokalemia ?  Stenosis of duodenum ?  Constipation ?  Hypophosphatemia ?  Goals of care, counseling/discussion ?  Palliative care encounter ? ?  Obstructive jaundice ?Presented with jaundice, with history of prior cholecystectomy, with elevated LFTs, lipase. ?GI was consulted and recommended ERCP. ?ERCP was attempted on 3/21 but unable to be performed due to food and liquid residue within the stomach.  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 ?4/1 continue TPN  ? ? ? ? ? ? ?Gastric outlet obstruction ?Due to metastatic breast cancer. ?MRCP on 3/21 showed masslike duodenal wall thickening extending to the ampulla, worrisome for duodenal/ampullary carcinoma. ...intrahepatic/extrahepatic ductal dilatation.... prominent pancreatic duct, extending to the ampulla... 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   ?--2.3 cm irregular/spiculated lesion in the left inferior breast, suspicious for primary breast neoplasm.  ?3/29 no surgical intervention ?Biopsy positive for adenocarcinoma with signet ring cells; awaiting breast biopsy result ?Biliary drain placed by IR on 3/22 ?Now tolerating clears and TPN ?PICC line placed on 3/25 and TPN started ? status post gastrojejunostomy on 3/30 ?Needed NG tube for 3 days however popped out.  Continue NPO. ?Surgery following.  If she does develop significant nausea may need to replace NG tube.   Would recommend placing it under fluoroscopy with IR secondary to recent gastric anastomosis. ?4/1 no nausea or vomiting with them removal of NG tube.  Continue to monitor.  We will follow-up with surgery for

## 2021-09-11 ENCOUNTER — Encounter: Payer: Self-pay | Admitting: Oncology

## 2021-09-11 DIAGNOSIS — K831 Obstruction of bile duct: Secondary | ICD-10-CM | POA: Diagnosis not present

## 2021-09-11 LAB — CBC
HCT: 37.3 % (ref 36.0–46.0)
Hemoglobin: 12.6 g/dL (ref 12.0–15.0)
MCH: 30.1 pg (ref 26.0–34.0)
MCHC: 33.8 g/dL (ref 30.0–36.0)
MCV: 89.2 fL (ref 80.0–100.0)
Platelets: 242 10*3/uL (ref 150–400)
RBC: 4.18 MIL/uL (ref 3.87–5.11)
RDW: 14.6 % (ref 11.5–15.5)
WBC: 11.3 10*3/uL — ABNORMAL HIGH (ref 4.0–10.5)
nRBC: 0 % (ref 0.0–0.2)

## 2021-09-11 LAB — GLUCOSE, CAPILLARY
Glucose-Capillary: 194 mg/dL — ABNORMAL HIGH (ref 70–99)
Glucose-Capillary: 204 mg/dL — ABNORMAL HIGH (ref 70–99)
Glucose-Capillary: 165 mg/dL — ABNORMAL HIGH (ref 70–99)
Glucose-Capillary: 149 mg/dL — ABNORMAL HIGH (ref 70–99)
Glucose-Capillary: 151 mg/dL — ABNORMAL HIGH (ref 70–99)
Glucose-Capillary: 179 mg/dL — ABNORMAL HIGH (ref 70–99)

## 2021-09-11 MED ORDER — TRAVASOL 10 % IV SOLN
INTRAVENOUS | Status: AC
  Filled 2021-09-11: qty 1036.8

## 2021-09-11 NOTE — Progress Notes (Signed)
Nurse and nurse tech attempted several times to disconnect biliary bag from connector site to flush line and were unsuccessful it was too tight and would not loosen/unscrew. Drain is having output 110 ml emptied prior to attempting flush. ?

## 2021-09-11 NOTE — Consult Note (Signed)
PHARMACY - TOTAL PARENTERAL NUTRITION CONSULT NOTE  ? ?Indication: Small bowel obstruction ? ?Patient Measurements: ?Height: 5' 3.5" (161.3 cm) ?Weight: 59.7 kg (131 lb 9.8 oz) ?IBW/kg (Calculated) : 53.55 ?TPN AdjBW (KG): 59.7 ?Body mass index is 22.95 kg/m?. ?Usual Weight: 64 kg ? ?Assessment:  ?66 years old female with PMH significant for diabetes, hypertension, hypothyroidism, prior cholecystectomy sent to [facility] ED 08/29/21 from PCP due to elevated liver enzymes. 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 Patient intolerant to NG tube with increased emesis ? ?Glucose / Insulin: BG 235; Semglee 42u + 0-15u Q6H; required 33 u insulin past 24 hrs ?Electrolytes: potassium, magnesium trending up ?Renal: BUN 29 ?Hepatic: AST 73, ALT 122, T Bili 3.9, Albumin 3.0, Protein 6.2 ?Intake / Output; MIVF: +370 mL ?GI Imaging: ?3/20: Suspected masslike duodenal wall thickening extending to the ampulla, worrisome for duodenal/ampullary carcinoma ? ?3/27: Percutaneous internal/external biliary drain  positioning is not significantly changed  ?GI Surgeries / Procedures:  ?3/21: Endoscopy ?3/22: external biliary drain placement  ? ? ?Central access: PICC line planned 09/03/21 ?TPN start date: 09/03/21 ? ?Nutritional Goals: ?Goal TPN rate is 90 mL/hr (provides 104 g of protein and 2186 kcals per day) ? ?RD Assessment: ?Estimated Needs ?Total Energy Estimated Needs: 2000-2200 kcal ?Total Protein Estimated Needs: 100-110 grams ?Total Fluid Estimated Needs: >/= 2 L/day ? ?Current Nutrition:  ?NPO ? ?Plan:  ?Continue TPN at goal rate of 90 mL/hr at 1800. Per surg, pt likely to go home on TPN.  ?Nutritional components ?Amino acids (using 10% Travasol) 103.7 grams ?Dextrose: 324 grams ?Lipids (using 20% SMOFlipids) 67 grams ?kCal 2186 ?Electrolytes in TPN: Na 29mq/L, K 263m/L, Ca 69m64mL, Mg 0mE20m, and Phos15 mmol/L. Cl:Ac 1:1 ?Add standard MVI to TPN ?Reduce  trace elements to 1/2 due to elevated t bili and jaundice ?Completed thiamine 100 mg to TPN x 5 days  ?BG remains elevated. MD increased insulin glargine to 46 units.  ?Increase to 50 units regular insulin in TPN ?Adjust SSI to resistant q4h SSI and adjust as needed  ?Monitor TPN labs on Mon/Thurs, daily until stable ? ?KishOswald HillockarmD ?Clinical Pharmacist ?09/11/2021 ?9:51 AM ? ? ?

## 2021-09-11 NOTE — Plan of Care (Signed)

## 2021-09-11 NOTE — Progress Notes (Signed)
POD # 3 G-J palliative ?Doing very well considering ?NO N/V ?AVSS ? ?Abd soft, incisions c/d/I ? ?A/P [place] well ?Upper gi series tomorrow before starting diet ?No surgical complications ?

## 2021-09-11 NOTE — Progress Notes (Signed)
?PROGRESS NOTE ? ? ? ?Diamond Hinton  YIA:[id number] DOB: 02/02/1956 DOA: 08/29/2021 ?PCP: Virginia Crews, MD  ? ? ?Brief Narrative:  ?This 66 years old female with PMH significant for diabetes, hypertension, hypothyroidism, prior cholecystectomy sent by her PCP with abnormal labs notably elevated liver enzymes.  Patient reports having gastric reflux symptoms for about a month and has been receiving treatment with famotidine, sucralfate but her symptoms had not improved.  Work-up in the ED shows elevated AST ALT and total bilirubin, lipase 395, UA: Large LE.  CT abdomen and pelvis shows marked distention of the stomach finding suggestive of possible benign or malignant stricture.  Endoscopic evaluation recommended.  Left breast mass with slightly spiculated margins consider outpatient mammogram.   ?  ?GI was consulted.  They attempted ERCP on 3/21, but unable to proceed due to food and liquid residue in the stomach.  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   ?  ?IR consulted for biliary drain placement on 3/22, but unable to advance drain past sphincter of Oddi. ?Returned to IR on 3/24 for advancement of drain.   ?  ?Preliminary biopsy results showing this is likely metastatic breast cancer.  Surgery consulted for consideration of a J tube for nutrition.  Oncology also consulted. ? ?3/30 overnight had vomiting.  Nausea is better controlled this a.m. specially when she sits up.  No new complaints ? ?3/31 NGT popped out by itself this am.  ?4/1 no nausea or vomiting.  Tolerating so far being without NG tube.  BG improving ?4/2 no n/v or abd pain. Had BM more diarrhea ? ? ?Consultants:  ?Oncology surgery ? ?Procedures:  ? ?Antimicrobials:  ?  ? ? ?Subjective: ?No cp, sob, dizziness ? ?Objective: ?Vitals:  ? 09/10/21 0826 09/10/21 1957 09/11/21 0401 09/11/21 0740  ?BP: 127/76 136/78 (!) 142/89 119/77  ?Pulse: 70 65 88 93  ?Resp: '18 18 20 16  '$ ?Temp: 97.9 ?F (36.6 ?C) 98.5 ?F (36.9 ?C)  98.4 ?F (36.9 ?C) 98.7 ?F (37.1 ?C)  ?TempSrc: Oral Oral Oral Oral  ?SpO2: 98% 99% 98% 97%  ?Weight:      ?Height:      ? ? ?Intake/Output Summary (Last 24 hours) at 09/11/2021 0845 ?Last data filed at 09/11/2021 0531 ?Gross per 24 hour  ?Intake 751.43 ml  ?Output 450 ml  ?Net 301.43 ml  ? ?Filed Weights  ? 08/29/21 1706 09/03/21 1200 09/08/21 1652  ?Weight: 64 kg 59.7 kg 59.7 kg  ? ? ?Examination: ?Calm, NAD ?Cta no w/r ?Reg s1/s2 no gallop ?Soft benign +bs ?No edema ?Aaoxox3  ?Mood and affect appropriate in current setting  ? ? ? ?Data Reviewed: I have personally reviewed following labs and imaging studies ? ?CBC: ?Recent Labs  ?Lab 09/11/21 ?7078  ?WBC 11.3*  ?HGB 12.6  ?HCT 37.3  ?MCV 89.2  ?PLT 242  ? ? ?Basic Metabolic Panel: ?Recent Labs  ?Lab 09/05/21 ?0635 09/06/21 ?6754 09/07/21 ?0340 09/08/21 ?4920 09/09/21 ?0448  ?NA 135 138 133* 135 136  ?K 3.7 4.1 4.2 4.6 4.1  ?CL 103 106 95* 96* 103  ?CO2 '23 23 28 29 27  '$ ?GLUCOSE 241* 289* 337* 283* 321*  ?BUN '22 22 21 '$ 34* 38*  ?CREATININE 0.55 0.67 0.51 0.66 0.85  ?CALCIUM 8.6* 9.1 10.1 10.4* 9.0  ?MG 2.2 2.1 2.3 2.4 2.1  ?PHOS 2.1* 3.3 3.9 4.6  --   ? ?GFR: ?Estimated Creatinine Clearance: 54.3 mL/min (by C-G formula based on  SCr of 0.85 mg/dL). ?Liver Function Tests: ?Recent Labs  ?Lab 09/05/21 ?0635 09/06/21 ?0017 09/08/21 ?0319  ?AST 54* 38 41  ?ALT 103* 86* 74*  ?ALKPHOS 94 87 95  ?BILITOT 3.2* 2.7* 2.9*  ?PROT 6.3* 6.7 7.7  ?ALBUMIN 2.9* 3.2* 3.3*  ? ?No results for input(s): LIPASE, AMYLASE in the last 168 hours. ?No results for input(s): AMMONIA in the last 168 hours. ?Coagulation Profile: ?No results for input(s): INR, PROTIME in the last 168 hours. ?Cardiac Enzymes: ?No results for input(s): CKTOTAL, CKMB, CKMBINDEX, TROPONINI in the last 168 hours. ?BNP (last 3 results) ?No results for input(s): PROBNP in the last 8760 hours. ?HbA1C: ?No results for input(s): HGBA1C in the last 72 hours. ?CBG: ?Recent Labs  ?Lab 09/10/21 ?1615 09/10/21 ?2018 09/10/21 ?2352  09/11/21 ?4944 09/11/21 ?9675  ?GLUCAP 160* 150* 160* 194* 204*  ? ?Lipid Profile: ?No results for input(s): CHOL, HDL, LDLCALC, TRIG, CHOLHDL, LDLDIRECT in the last 72 hours. ? ?Thyroid Function Tests: ?No results for input(s): TSH, T4TOTAL, FREET4, T3FREE, THYROIDAB in the last 72 hours. ?Anemia Panel: ?No results for input(s): VITAMINB12, FOLATE, FERRITIN, TIBC, IRON, RETICCTPCT in the last 72 hours. ?Sepsis Labs: ?No results for input(s): PROCALCITON, LATICACIDVEN in the last 168 hours. ? ?No results found for this or any previous visit (from the past 240 hour(s)). ?  ? ? ? ? ? ?Radiology Studies: ?No results found. ? ? ? ? ? ?Scheduled Meds: ? Chlorhexidine Gluconate Cloth  6 each Topical Daily  ? enoxaparin (LOVENOX) injection  40 mg Subcutaneous Q24H  ? hydrochlorothiazide  25 mg Oral Daily  ? insulin aspart  0-20 Units Subcutaneous Q4H  ? insulin glargine-yfgn  46 Units Subcutaneous Daily  ? levothyroxine  75 mcg Oral Q0600  ? lidocaine-EPINEPHrine  10 mL Intradermal Once  ? lidocaine-EPINEPHrine  1.7 mL Intradermal Once  ? pantoprazole (PROTONIX) IV  40 mg Intravenous Q12H  ? scopolamine  1 patch Transdermal Q72H  ? sodium chloride flush  10-40 mL Intracatheter Q12H  ? sodium chloride flush  5 mL Intracatheter Q8H  ? ?Continuous Infusions: ? sodium chloride Stopped (09/08/21 1936)  ? TPN ADULT (ION) 90 mL/hr at 09/10/21 1742  ? ? ?Assessment & Plan: ?  ?Principal Problem: ?  Obstructive jaundice ?Active Problems: ?  Metastatic breast cancer ?  Gastric outlet obstruction ?  Elevated lipase ?  Abnormal LFTs ?  UTI (urinary tract infection) ?  History of cholecystectomy ?  Uncontrolled type 2 diabetes mellitus with hyperglycemia, without long-term current use of insulin ([place]) ?  Adult hypothyroidism ?  Hypertension ?  Hypokalemia ?  Stenosis of duodenum ?  Constipation ?  Hypophosphatemia ?  Goals of care, counseling/discussion ?  Palliative care encounter ? ?Obstructive jaundice ?Presented with jaundice, with  history of prior cholecystectomy, with elevated LFTs, lipase. ?GI was consulted and recommended ERCP. ?ERCP was attempted on 3/21 but unable to be performed due to food and liquid residue within the stomach.  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 ?4/2 continue TPN ? ? ? ? ? ? ?Gastric outlet obstruction ?Due to metastatic breast cancer. ?MRCP on 3/21 showed masslike duodenal wall thickening extending to the ampulla, worrisome for duodenal/ampullary carcinoma. ...intrahepatic/extrahepatic ductal dilatation.... prominent pancreatic duct, extending to the ampulla... 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   ?--2.3 cm irregular/spiculated lesion in the left inferior breast, suspicious for primary breast neoplasm.  ?3/29 no surgical intervention ?Biopsy positive for adenocarcinoma with signet  ring cells; awaiting breast biopsy result ?Biliary drain placed by IR on 3/22 ?Now tolerating clears and TPN ?PICC line placed on 3/25 and TPN started ? status post gastrojejunostomy on 3/30 ?Needed NG tube for 3 days however popped out.  Continue NPO. ?Surgery following.  If she does develop significant nausea may need to replace NG tube.  Would recommend placing it under fluoroscopy with IR secondary to recent gastric anastomosis. ?4/2 s/p GJ palliative ?No GI sx.  ?Plan for upper GI series tomorrow before starting diet. ?No surgical complications ? ? ? ? ? ? ?  ?  ?Elevated lipase ?No focal abnormality seen in the pancreas on CT.  Suspect related to obstructive process. ?4/2 biliary drain placed by IR on 3/22 ? ? ? ? ? ? ? ? ?  ?Metastatic breast cancer ([place]) ?New diagnosis this admission.  Presented with biliary obstructive symptoms and noted to have spiculated mass in left breast on imaging upon admission.  Biopsied during attempted ERCP and pathology has returned showing invasive lobular carcinoma. ?--Oncology following, Dr. Janese Banks ?Tissue sent for  NGS testing. ?Oncology plan to start AI plus CDK inihibitor as outpt. ?If GOO relieved with GJ , proceed with Ribociclib.  ?4/1 palliative following.  ?Breast bx Path report-invasive lobular CA with pl

## 2021-09-12 ENCOUNTER — Inpatient Hospital Stay: Payer: Medicare HMO

## 2021-09-12 ENCOUNTER — Encounter: Payer: Self-pay | Admitting: Oncology

## 2021-09-12 ENCOUNTER — Other Ambulatory Visit (HOSPITAL_COMMUNITY): Payer: Self-pay

## 2021-09-12 DIAGNOSIS — K831 Obstruction of bile duct: Secondary | ICD-10-CM | POA: Diagnosis not present

## 2021-09-12 LAB — GLUCOSE, CAPILLARY
Glucose-Capillary: 138 mg/dL — ABNORMAL HIGH (ref 70–99)
Glucose-Capillary: 160 mg/dL — ABNORMAL HIGH (ref 70–99)
Glucose-Capillary: 183 mg/dL — ABNORMAL HIGH (ref 70–99)
Glucose-Capillary: 195 mg/dL — ABNORMAL HIGH (ref 70–99)
Glucose-Capillary: 211 mg/dL — ABNORMAL HIGH (ref 70–99)
Glucose-Capillary: 234 mg/dL — ABNORMAL HIGH (ref 70–99)

## 2021-09-12 LAB — COMPREHENSIVE METABOLIC PANEL
ALT: 38 U/L (ref 0–44)
AST: 29 U/L (ref 15–41)
Albumin: 2.9 g/dL — ABNORMAL LOW (ref 3.5–5.0)
Alkaline Phosphatase: 68 U/L (ref 38–126)
Anion gap: 7 (ref 5–15)
BUN: 24 mg/dL — ABNORMAL HIGH (ref 8–23)
CO2: 26 mmol/L (ref 22–32)
Calcium: 9 mg/dL (ref 8.9–10.3)
Chloride: 104 mmol/L (ref 98–111)
Creatinine, Ser: 0.52 mg/dL (ref 0.44–1.00)
GFR, Estimated: >60 mL/min (ref >60)
Glucose, Bld: 107 mg/dL — ABNORMAL HIGH (ref 70–99)
Potassium: 3.1 mmol/L — ABNORMAL LOW (ref 3.5–5.1)
Sodium: 137 mmol/L (ref 135–145)
Total Bilirubin: 1.8 mg/dL — ABNORMAL HIGH (ref 0.3–1.2)
Total Protein: 6.4 g/dL — ABNORMAL LOW (ref 6.5–8.1)

## 2021-09-12 LAB — PHOSPHORUS: Phosphorus: 2.7 mg/dL (ref 2.5–4.6)

## 2021-09-12 LAB — TRIGLYCERIDES: Triglycerides: 95 mg/dL (ref <150)

## 2021-09-12 LAB — MAGNESIUM: Magnesium: 1.8 mg/dL (ref 1.7–2.4)

## 2021-09-12 MED ORDER — BOOST / RESOURCE BREEZE PO LIQD CUSTOM
1.0000 | Freq: Three times a day (TID) | ORAL | Status: DC
  Administered 2021-09-12 – 2021-09-13 (×4): 1 via ORAL

## 2021-09-12 MED ORDER — POTASSIUM CHLORIDE CRYS ER 20 MEQ PO TBCR
40.0000 meq | EXTENDED_RELEASE_TABLET | ORAL | Status: AC
  Administered 2021-09-12 (×2): 40 meq via ORAL
  Filled 2021-09-12 (×2): qty 2

## 2021-09-12 MED ORDER — MAGNESIUM SULFATE 2 GM/50ML IV SOLN
2.0000 g | Freq: Once | INTRAVENOUS | Status: AC
  Administered 2021-09-12: 2 g via INTRAVENOUS
  Filled 2021-09-12: qty 50

## 2021-09-12 MED ORDER — TRAVASOL 10 % IV SOLN
INTRAVENOUS | Status: AC
  Filled 2021-09-12: qty 1036.8

## 2021-09-12 MED ORDER — IOHEXOL 300 MG/ML  SOLN
150.0000 mL | Freq: Once | INTRAMUSCULAR | Status: AC | PRN
  Administered 2021-09-12: 150 mL via ORAL

## 2021-09-12 NOTE — Progress Notes (Signed)
Inpatient Diabetes Program Recommendations ? ?AACE/ADA: New Consensus Statement on Inpatient Glycemic Control (2015) ? ?Target Ranges:  Prepandial:   less than 140 mg/dL ?     Peak postprandial:   less than 180 mg/dL (1-2 hours) ?     Critically ill patients:  140 - 180 mg/dL  ? ? Latest Reference Range & Units 09/11/21 23:27 09/12/21 03:36 09/12/21 08:30 09/12/21 11:47  ?Glucose-Capillary 70 - 99 mg/dL 179 (H) ? ?4 units Novolog ? 160 (H) ? ?4 units Novolog 183 (H) ? ?4 units Novolog ? ?46 units Semglee 195 (H)  ?(H): Data is abnormally high ? ? ?Admit with:  ?Obstructive jaundice ?Metastatic breast cancer ([place]) ?Gastric outlet obstruction ?  ?History: DM2 ?  ?Home DM Meds: None ?  ?Current Orders: Novolog 0-20 units Q4H ?   Semglee 46 units daily ?   TPN @ 90 ml/hr + 50 units Insulin included in solution ?  ?  ?  ?To discharge home with Insulin + Freestyle Libre 2 CGM ?  ?Diabetes RN to provide Freestyle Libre 2 CGM to pt prior to discharge ? ? ?MD- Note Pt allowed CL diet to start today.  At some point, I anticipate TPN will be reduced, and when that occurs, we will likely need to reduce pt's ordered Semglee and Novolog SSI insulins.   ? ?Will make sure Diabetes RN at [facility] follows pt daily to ensure insulin recommendations are made if needed ? ? ? ?Met w/ pt this AM (daughter present at bedside).  MD came into review results of UGI study completed this AM.  Per MD, pt allowed to start CL diet today.  Discussed with pt and daughter that I am unsure what/how much insulin pt will need when she goes home.  Re-Explained to pt all the 3 insulins pt is currently getting (Semglee, Novolog SSI Q4H, Insulin in the TPN).  Discussed with pt and Dtr that if pt tolerates CL diet and advances to FL diet, she may not need TPN to go home on and her Insulin needs will likely drastically reduce with reduction and possible d/c of the TPN.  Pt and dtr aware that we are unsure how much insulin will be needed at this point but are  still comfortable with pt using insulin at home if needed when she is discharged.  Discussed w/ pt and dtr that I will call pharmacy to see what the plan is for the TPN at present and will make recommendations for reduction of Insulin when the TPN begins to be down-titrated.  Also reminded pt that our team still has a [employer] 2 Reader and Sensors for her that we will give to her prior to d/c.  Explained to pt that I want to wait until she is ready to go home until we start the sensor on her so she will get maximum days at home with the sensor.  Pt and dtr appreciative and agreeable.  I will ask the DM RN who will be on [place] tomorrow 04/04 to follow up and bring Sensor and Reader to pt and help pt restart sensor prior to d/c home on discharge day. ? ?Called pharmacy to discuss plans for TPN.  Per pharmacy, they will leave the TPN at current rate of 90cc/hr for today and through the night.  Plan is to evaluate pt and see if she can tolerate CL (and then FL diet) before reducing the TPN.  Will have the DM RN follow up 04/04  to see what Insulin adjustments may be needed. ? ? ? ?--Will follow patient during hospitalization-- ? ?Wyn Quaker RN, MSN, CDE ?Diabetes Coordinator ?Inpatient Glycemic Control Team ?Team Pager: [phone] (8a-5p) ? ?

## 2021-09-12 NOTE — Plan of Care (Signed)

## 2021-09-12 NOTE — Consult Note (Signed)
PHARMACY - TOTAL PARENTERAL NUTRITION CONSULT NOTE  ? ?Indication: Small bowel obstruction ? ?Patient Measurements: ?Height: 5' 3.5" (161.3 cm) ?Weight: 59.7 kg (131 lb 9.8 oz) ?IBW/kg (Calculated) : 53.55 ?TPN AdjBW (KG): 59.7 ?Body mass index is 22.95 kg/m?. ?Usual Weight: 64 kg ? ?Assessment:  ?66 years old female with PMH significant for diabetes, hypertension, hypothyroidism, prior cholecystectomy sent to [facility] ED 08/29/21 from PCP due to elevated liver enzymes. 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 Patient intolerant to NG tube with increased emesis ? ?Glucose / Insulin: BG 235; Semglee 42u + 0-15u Q6H; required 26 u insulin past 24 hrs ?Electrolytes: potassium 3.1, magnesium 1.8(being replaced) ?Renal: BUN 29 ?Hepatic: AST 73, ALT 122, T Bili 3.9, Albumin 3.0, Protein 6.2 ?Intake / Output; MIVF: +1.4 L ?GI Imaging: ?3/20: Suspected masslike duodenal wall thickening extending to the ampulla, worrisome for duodenal/ampullary carcinoma ? ?3/27: Percutaneous internal/external biliary drain  positioning is not significantly changed  ?GI Surgeries / Procedures:  ?3/21: Endoscopy ?3/22: external biliary drain placement  ? ? ?Central access: PICC line planned 09/03/21 ?TPN start date: 09/03/21 ? ?Nutritional Goals: ?Goal TPN rate is 90 mL/hr (provides 104 g of protein and 2186 kcals per day) ? ?RD Assessment: ?Estimated Needs ?Total Energy Estimated Needs: 2000-2200 kcal ?Total Protein Estimated Needs: 100-110 grams ?Total Fluid Estimated Needs: >/= 2 L/day ? ?Current Nutrition:  ?NPO ? ?Plan:  ?Continue TPN at goal rate of 90 mL/hr at 1800. Per surg, pt likely to go home on TPN.  ?Nutritional components ?Amino acids (using 10% Travasol) 103.7 grams ?Dextrose: 324 grams ?Lipids (using 20% SMOFlipids) 67 grams ?kCal 2186 ?Electrolytes in TPN: Na 71mq/L, K 252m/L, Ca 65m765mL, Mg 65mE69m, and Phos15 mmol/L. Cl:Ac 1:1 ?Add standard MVI to  TPN ?Reduce trace elements to 1/2 due to elevated t bili and jaundice ?Completed thiamine 100 mg to TPN x 5 days  ?BG remains elevated. MD increased insulin glargine to 46 units.  ?50 units regular insulin in TPN ?Adjust SSI to resistant q4h SSI and adjust as needed  ?Monitor TPN labs on Mon/Thurs, daily until stable ? ?Diamond Hinton ?Clinical Pharmacist ?09/12/2021 ?10:24 AM ? ? ?

## 2021-09-12 NOTE — Progress Notes (Signed)
BP low. Order received from Dr Kurtis Bushman to hold am HCTZ ?

## 2021-09-12 NOTE — Progress Notes (Signed)
?PROGRESS NOTE ? ? ? ?Diamond Hinton  OIN:[id number] DOB: [birth year] DOA: 08/29/2021 ?PCP: Virginia Crews, MD  ? ? ?Brief Narrative:  ?This 66 years old female with PMH significant for diabetes, hypertension, hypothyroidism, prior cholecystectomy sent by her PCP with abnormal labs notably elevated liver enzymes.  Patient reports having gastric reflux symptoms for about a month and has been receiving treatment with famotidine, sucralfate but her symptoms had not improved.  Work-up in the ED shows elevated AST ALT and total bilirubin, lipase 395, UA: Large LE.  CT abdomen and pelvis shows marked distention of the stomach finding suggestive of possible benign or malignant stricture.  Endoscopic evaluation recommended.  Left breast mass with slightly spiculated margins consider outpatient mammogram.   ?  ?GI was consulted.  They attempted ERCP on 3/21, but unable to proceed due to food and liquid residue in the stomach.  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   ?  ?IR consulted for biliary drain placement on 3/22, but unable to advance drain past sphincter of Oddi. ?Returned to IR on 3/24 for advancement of drain.   ?  ?Preliminary biopsy results showing this is likely metastatic breast cancer.  Surgery consulted for consideration of a J tube for nutrition.  Oncology also consulted. ? ?3/30 overnight had vomiting.  Nausea is better controlled this a.m. specially when she sits up.  No new complaints ? ?3/31 NGT popped out by itself this am.  ?4/1 no nausea or vomiting.  Tolerating so far being without NG tube.  BG improving ?4/2 no n/v or abd pain. Had BM more diarrhea ?4/3  ? ? ?Consultants:  ?Oncologysurgery ? ?Procedures:  ? ?Antimicrobials:  ?  ? ? ?Subjective: ?No nausea, vomiting or abdominal pain.  Just soreness.  Passing gas.  No diarrhea today. ? ? ?Objective: ?Vitals:  ? 09/11/21 1612 09/11/21 1959 09/12/21 0410 09/12/21 0825  ?BP: 122/80 114/70 130/78 90/62  ?Pulse: 86 80  66 77  ?Resp: '16 20 16 16  '$ ?Temp: 99 ?F (37.2 ?C) 98.2 ?F (36.8 ?C) 97.9 ?F (36.6 ?C) 98 ?F (36.7 ?C)  ?TempSrc: Oral     ?SpO2: 98% 97% 98% 100%  ?Weight:      ?Height:      ? ? ?Intake/Output Summary (Last 24 hours) at 09/12/2021 0827 ?Last data filed at 09/12/2021 0415 ?Gross per 24 hour  ?Intake 0 ml  ?Output 480 ml  ?Net -480 ml  ? ?Filed Weights  ? 08/29/21 1706 09/03/21 1200 09/08/21 1652  ?Weight: 64 kg 59.7 kg 59.7 kg  ? ? ?Examination: ?Calm, NAD ?Cta no w/r ?Reg s1/s2 no gallop ?Soft dressing in place. ?No edema ?Aaoxox3  ?Mood and affect appropriate in current setting  ? ? ? ?Data Reviewed: I have personally reviewed following labs and imaging studies ? ?CBC: ?Recent Labs  ?Lab 09/11/21 ?7096  ?WBC 11.3*  ?HGB 12.6  ?HCT 37.3  ?MCV 89.2  ?PLT 242  ? ? ?Basic Metabolic Panel: ?Recent Labs  ?Lab 09/06/21 ?2836 09/07/21 ?0340 09/08/21 ?6294 09/09/21 ?0448 09/12/21 ?7654  ?NA 138 133* 135 136 137  ?K 4.1 4.2 4.6 4.1 3.1*  ?CL 106 95* 96* 103 104  ?CO2 '23 28 29 27 26  '$ ?GLUCOSE 289* 337* 283* 321* 107*  ?BUN 22 21 34* 38* 24*  ?CREATININE 0.67 0.51 0.66 0.85 0.52  ?CALCIUM 9.1 10.1 10.4* 9.0 9.0  ?MG 2.1 2.3 2.4 2.1 1.8  ?PHOS 3.3 3.9 4.6  --  2.7  ? ?GFR: ?Estimated Creatinine Clearance: 57.7 mL/min (by C-G formula based on SCr of 0.52 mg/dL). ?Liver Function Tests: ?Recent Labs  ?Lab 09/06/21 ?1914 09/08/21 ?7829 09/12/21 ?5621  ?AST 38 41 29  ?ALT 86* 74* 38  ?ALKPHOS 87 95 68  ?BILITOT 2.7* 2.9* 1.8*  ?PROT 6.7 7.7 6.4*  ?ALBUMIN 3.2* 3.3* 2.9*  ? ?No results for input(s): LIPASE, AMYLASE in the last 168 hours. ?No results for input(s): AMMONIA in the last 168 hours. ?Coagulation Profile: ?No results for input(s): INR, PROTIME in the last 168 hours. ?Cardiac Enzymes: ?No results for input(s): CKTOTAL, CKMB, CKMBINDEX, TROPONINI in the last 168 hours. ?BNP (last 3 results) ?No results for input(s): PROBNP in the last 8760 hours. ?HbA1C: ?No results for input(s): HGBA1C in the last 72 hours. ?CBG: ?Recent Labs   ?Lab 09/11/21 ?1156 09/11/21 ?1613 09/11/21 ?1924 09/11/21 ?2327 09/12/21 ?0336  ?GLUCAP 165* 149* 151* 179* 160*  ? ?Lipid Profile: ?Recent Labs  ?  09/12/21 ?0605  ?TRIG 95  ? ? ?Thyroid Function Tests: ?No results for input(s): TSH, T4TOTAL, FREET4, T3FREE, THYROIDAB in the last 72 hours. ?Anemia Panel: ?No results for input(s): VITAMINB12, FOLATE, FERRITIN, TIBC, IRON, RETICCTPCT in the last 72 hours. ?Sepsis Labs: ?No results for input(s): PROCALCITON, LATICACIDVEN in the last 168 hours. ? ?No results found for this or any previous visit (from the past 240 hour(s)). ?  ? ? ? ? ? ?Radiology Studies: ?No results found. ? ? ? ? ? ?Scheduled Meds: ? Chlorhexidine Gluconate Cloth  6 each Topical Daily  ? enoxaparin (LOVENOX) injection  40 mg Subcutaneous Q24H  ? hydrochlorothiazide  25 mg Oral Daily  ? insulin aspart  0-20 Units Subcutaneous Q4H  ? insulin glargine-yfgn  46 Units Subcutaneous Daily  ? levothyroxine  75 mcg Oral Q0600  ? pantoprazole (PROTONIX) IV  40 mg Intravenous Q12H  ? potassium chloride  40 mEq Oral Q4H  ? scopolamine  1 patch Transdermal Q72H  ? sodium chloride flush  10-40 mL Intracatheter Q12H  ? sodium chloride flush  5 mL Intracatheter Q8H  ? ?Continuous Infusions: ? sodium chloride Stopped (09/08/21 1936)  ? magnesium sulfate bolus IVPB    ? TPN ADULT (ION) 90 mL/hr at 09/11/21 1737  ? ? ?Assessment & Plan: ?  ?Principal Problem: ?  Obstructive jaundice ?Active Problems: ?  Primary malignant neoplasm of breast with metastasis (Diamond Hinton) ?  Gastric outlet obstruction ?  Elevated lipase ?  Abnormal LFTs ?  UTI (urinary tract infection) ?  History of cholecystectomy ?  Uncontrolled type 2 diabetes mellitus with hyperglycemia, without long-term current use of insulin (Diamond Hinton) ?  Adult hypothyroidism ?  Hypertension ?  Hypokalemia ?  Stenosis of duodenum ?  Constipation ?  Hypophosphatemia ?  Goals of care, counseling/discussion ?  Palliative care encounter ? ?Obstructive jaundice ?Presented with  jaundice, with history of prior cholecystectomy, with elevated LFTs, lipase. ?GI was consulted and recommended ERCP. ?ERCP was attempted on 3/21 but unable to be performed due to food and liquid residue within the stomach.  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 ?4/3 continue TPN ? ? ? ? ? ? ? ?Gastric outlet obstruction ?Due to metastatic breast cancer. ?MRCP on 3/21 showed masslike duodenal wall thickening extending to the ampulla, worrisome for duodenal/ampullary carcinoma. ...intrahepatic/extrahepatic ductal dilatation.... prominent pancreatic duct, extending to the ampulla... 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   ?--2.3 cm irregular/spiculated lesion in the left  inferior breast, suspicious for primary breast neoplasm.  ?3/29 no surgical intervention ?Biopsy positive for adenocarcinoma with signet ring cells; awaiting breast biopsy result ?Biliary drain placed by IR on 3/22 ?Now tolerating clears and TPN ?PICC line placed on 3/25 and TPN started ? status post gastrojejunostomy on 3/30 ?Needed NG tube for 3 days however popped out.  Continue NPO. ?Surgery following.  If she does develop significant nausea may need to replace NG tube.  Would recommend placing it under fluoroscopy with IR secondary to recent gastric anastomosis. ?4/2 s/p GJ palliative ?No GI sx.  ?4/3 upper GI series today before starting diet. ?No surgical complications. ? ? ?  ?  ?Elevated lipase ?No focal abnormality seen in the pancreas on CT.  Suspect related to obstructive process. ?4/3 biliary drain placed by IR on 3/22 ? ? ? ?  ?Metastatic breast cancer (Diamond Hinton) ?New diagnosis this admission.  Presented with biliary obstructive symptoms and noted to have spiculated mass in left breast on imaging upon admission.  Biopsied during attempted ERCP and pathology has returned showing invasive lobular carcinoma. ?--Oncology following, Dr. Janese Banks ?Tissue sent for NGS  testing. ?Oncology plan to start AI plus CDK inihibitor as outpt. ?If GOO relieved with GJ , proceed with Ribociclib.  ?4/1 palliative following.  ?Breast bx Path report-invasive lobular CA with pleomorphic f

## 2021-09-12 NOTE — TOC Progression Note (Signed)
Transition of Care (TOC) - Progression Note  ? ? ?Patient Details  ?Name: Diamond Hinton ?MRN: [id number] ?Date of Birth: [birth year] ? ?Transition of Care (TOC) CM/SW Contact  ?Beverly Sessions, RN ?Phone Number: ?09/12/2021, 1:49 PM ? ?Clinical Narrative:    ?Upper Gi study pending for today with goal to initiate diet  ? ? ? ?  ?  ? ?Expected Discharge Plan and Services ?  ?  ?  ?  ?  ?                ?  ?  ?  ?  ?  ?  ?  ?  ?  ?  ? ? ?Social Determinants of Health (SDOH) Interventions ?  ? ?Readmission Risk Interventions ?   ? View : No data to display.  ?  ?  ?  ? ? ?

## 2021-09-12 NOTE — Progress Notes (Signed)
[facility] ?SURGICAL PROGRESS NOTE ? ?Hospital Day(s): 14.  ? ?Post op day(s): 4 Days Post-Op.  ? ?Interval History:  ?Patient seen and examined ?No acute events or new complaints overnight.  ?Patient reports she is feeling good; abdominal pain improving ?No fever, chills, nausea, emesis ?sCr - 0.85; UO - unmeasured ?Mild hypokalemia; 3.1 ?Hyperbilirubinemia continues to improve; 1.8 ?Biliary drain with 480 ccs out; bilious ?She is NPO; Continues on TPN ?She is having loose stools  ?Plan for UGI this morning ? ? ?Vital signs in last 24 hours: [min-max] current  ?Temp:  [97.9 ?F (36.6 ?C)-99 ?F (37.2 ?C)] 97.9 ?F (36.6 ?C) (04/03 0410) ?Pulse Rate:  [66-93] 66 (04/03 0410) ?Resp:  [16-20] 16 (04/03 0410) ?BP: (114-130)/(70-80) 130/78 (04/03 0410) ?SpO2:  [97 %-98 %] 98 % (04/03 0410)     Height: 5' 3.5" (161.3 cm) Weight: 59.7 kg BMI (Calculated): 22.95  ? ?Intake/Output last 2 shifts:  ?04/02 0701 - 04/03 0700 ?In: 0  ?Out: 480 [Drains:480]  ? ?Physical Exam:  ?Constitutional: alert, cooperative and no distress  ?Respiratory: breathing non-labored at rest  ?Cardiovascular: regular rate and sinus rhythm  ?Gastrointestinal: soft, incisional soreness, and non-distended, no rebound/guarding. Biliary drain present; output remains bilious ?Musculoskeletal: no edema or wounds, motor and sensation grossly intact, NT  ? ?Labs:  ? ?  Latest Ref Rng & Units 09/11/2021  ?  5:54 AM 09/02/2021  ?  3:47 AM 09/01/2021  ?  4:11 AM  ?CBC  ?WBC 4.0 - 10.5 K/uL 11.3   10.4   11.1    ?Hemoglobin 12.0 - 15.0 g/dL 12.6   14.3   Diamond.8    ?Hematocrit 36.0 - 46.0 % 37.3   42.3   41.4    ?Platelets 150 - 400 K/uL 242   274   260    ? ? ?  Latest Ref Rng & Units 09/12/2021  ?  6:03 AM 09/09/2021  ?  4:48 AM 09/08/2021  ?  3:19 AM  ?CMP  ?Glucose 70 - 99 mg/dL 107   321   283    ?BUN 8 - 23 mg/dL 24   38   34    ?Creatinine 0.44 - 1.00 mg/dL 0.52   0.85   0.66    ?Sodium 135 - 145 mmol/L 137   136   135    ?Potassium 3.5 - 5.1 mmol/L  3.1   4.1   4.6    ?Chloride 98 - 111 mmol/L 104   103   96    ?CO2 22 - 32 mmol/L '26   27   29    '$ ?Calcium 8.9 - 10.3 mg/dL 9.0   9.0   10.4    ?Total Protein 6.5 - 8.1 g/dL 6.4    7.7    ?Total Bilirubin 0.3 - 1.2 mg/dL 1.8    2.9    ?Alkaline Phos 38 - 126 U/L 68    95    ?AST 15 - 41 U/L 29    41    ?ALT 0 - 44 U/L 38    74    ? ? ? ?Imaging studies: No new pertinent imaging studies ? ? ?Assessment/Plan:  ?66 y.o. Hinton 4 Days Post-Op s/p robotic assisted laparoscopic gastrojejunostomy for GOO secondary malignant obstruction of the duodenum, biopsy proven breast primary from left breast mass ? ? - Plan for UGI today to reassess gastrojejunostomy to ensure patency without leak. Pending results, we may be able to initiate diet  today ? ? - Continue TPN; monitor electrolytes  ?         - Monitor abdominal examination; on-going bowel function  ?         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 ?All of the above findings and recommendations were discussed with the patient, patient's family (daughter at bedside), and the medical team, and all of patient's and family's questions were answered to their expressed satisfaction. ? ?-- ?Edison Simon, PA-C ?[facility] ?09/12/2021, 7:27 AM ?[phone] ?M-F: 7am - 4pm ? ?

## 2021-09-13 DIAGNOSIS — K831 Obstruction of bile duct: Secondary | ICD-10-CM | POA: Diagnosis not present

## 2021-09-13 LAB — GLUCOSE, CAPILLARY
Glucose-Capillary: 148 mg/dL — ABNORMAL HIGH (ref 70–99)
Glucose-Capillary: 163 mg/dL — ABNORMAL HIGH (ref 70–99)
Glucose-Capillary: 166 mg/dL — ABNORMAL HIGH (ref 70–99)
Glucose-Capillary: 178 mg/dL — ABNORMAL HIGH (ref 70–99)
Glucose-Capillary: 181 mg/dL — ABNORMAL HIGH (ref 70–99)
Glucose-Capillary: 224 mg/dL — ABNORMAL HIGH (ref 70–99)

## 2021-09-13 LAB — POTASSIUM: Potassium: 4.1 mmol/L (ref 3.5–5.1)

## 2021-09-13 MED ORDER — ADULT MULTIVITAMIN W/MINERALS CH
1.0000 | ORAL_TABLET | Freq: Every day | ORAL | Status: DC
  Administered 2021-09-14: 1 via ORAL
  Filled 2021-09-13: qty 1

## 2021-09-13 MED ORDER — TRAVASOL 10 % IV SOLN
INTRAVENOUS | Status: AC
  Filled 2021-09-13: qty 518.4

## 2021-09-13 MED ORDER — ENSURE ENLIVE PO LIQD
237.0000 mL | Freq: Three times a day (TID) | ORAL | Status: DC
  Administered 2021-09-13 – 2021-09-14 (×2): 237 mL via ORAL

## 2021-09-13 NOTE — Telephone Encounter (Signed)
Patient had gastrojejunostomy which will now allow her to take medication by mouth without needed to dissolve. Dr. Janese Banks would like to proceed with the ribociclib as originally planned.  ?

## 2021-09-13 NOTE — Consult Note (Signed)
PHARMACY - TOTAL PARENTERAL NUTRITION CONSULT NOTE  ? ?Indication: Small bowel obstruction ? ?Patient Measurements: ?Height: 5' 3.5" (161.3 cm) ?Weight: 63.2 kg (139 lb 5.3 oz) ?IBW/kg (Calculated) : 53.55 ?TPN AdjBW (KG): 59.7 ?Body mass index is 24.29 kg/m?. ?Usual Weight: 64 kg ? ?Assessment:  ?66 years old female with PMH significant for diabetes, hypertension, hypothyroidism, prior cholecystectomy sent to [facility] ED 08/29/21 from PCP due to elevated liver enzymes. 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 Patient intolerant to NG tube with increased emesis ? ?Glucose / Insulin: BG 224; Semglee 46u + 0-15u Q6H; required 29 u insulin past 24 hrs ?Electrolytes: WNL ?Renal: BUN 24 ?Hepatic: AST 29, ALT 38, T Bili 1.8, Albumin 2.9, Protein 6.2 ?Intake / Output; MIVF: +1.3 L ?GI Imaging: ?3/20: Suspected masslike duodenal wall thickening extending to the ampulla, worrisome for duodenal/ampullary carcinoma ? ?3/27: Percutaneous internal/external biliary drain  positioning is not significantly changed  ?GI Surgeries / Procedures:  ?3/21: Endoscopy ?3/22: external biliary drain placement  ? ? ?Central access: PICC line planned 09/03/21 ?TPN start date: 09/03/21 ? ?Nutritional Goals: ?Goal TPN rate is 90 mL/hr (provides 104 g of protein and 2186 kcals per day) ? ?RD Assessment: ?Estimated Needs ?Total Energy Estimated Needs: 2000-2200 kcal ?Total Protein Estimated Needs: 100-110 grams ?Total Fluid Estimated Needs: >/= 2 L/day ? ?Current Nutrition:  ?NPO ? ?Plan:  ?Will decrease TPN to half rate of 45 mL/hr at 1800. Per surg, pt likely to go home on TPN.  ?Nutritional components ?Amino acids (using 10% Travasol) 51.9 grams ?Dextrose: 162 grams ?Lipids (using 20% SMOFlipids) 33.5 grams ?kCal 1093 ?Electrolytes in TPN: Na 67mq/L, K 252m/L, Ca 2m48mL, Mg 2mE24m, and Phos15 mmol/L. Cl:Ac 1:1 ?Add standard MVI to TPN ?Reduce trace elements to 1/2 due  to elevated t bili and jaundice ?Completed thiamine 100 mg to TPN x 5 days  ?BG remains elevated. MD increased insulin glargine to 46 units.  ?25 units regular insulin in TPN(half of the amount of the previous bag) ?Adjust SSI to resistant q4h SSI and adjust as needed  ?Monitor TPN labs on Mon/Thurs, daily until stable ? ?WaliPearla DubonnetarmD ?Clinical Pharmacist ?09/13/2021 ?12:26 PM ? ? ?

## 2021-09-13 NOTE — Progress Notes (Signed)
?PROGRESS NOTE ? ? ? ?LORELLA GOMEZ  LFY:[id number] DOB: May 12, 1956 DOA: 08/29/2021 ?PCP: Virginia Crews, MD  ? ? ?Brief Narrative:  ?This 66 years old female with PMH significant for diabetes, hypertension, hypothyroidism, prior cholecystectomy sent by her PCP with abnormal labs notably elevated liver enzymes.  Patient reports having gastric reflux symptoms for about a month and has been receiving treatment with famotidine, sucralfate but her symptoms had not improved.  Work-up in the ED shows elevated AST ALT and total bilirubin, lipase 395, UA: Large LE.  CT abdomen and pelvis shows marked distention of the stomach finding suggestive of possible benign or malignant stricture.  Endoscopic evaluation recommended.  Left breast mass with slightly spiculated margins consider outpatient mammogram.   ?  ?GI was consulted.  They attempted ERCP on 3/21, but unable to proceed due to food and liquid residue in the stomach.  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   ?  ?IR consulted for biliary drain placement on 3/22, but unable to advance drain past sphincter of Oddi. ?Returned to IR on 3/24 for advancement of drain.   ?  ?Preliminary biopsy results showing this is likely metastatic breast cancer.  Surgery consulted for consideration of a J tube for nutrition.  Oncology also consulted. ? ?3/30 overnight had vomiting.  Nausea is better controlled this a.m. specially when she sits up.  No new complaints ? ?3/31 NGT popped out by itself this am.  ?4/1 no nausea or vomiting.  Tolerating so far being without NG tube.  BG improving ?4/2 no n/v or abd pain. Had BM more diarrhea ?4/4 s/p UGI series on 4/3. Started on full liquid diet today. So far tolerating. ? ? ?Consultants:  ?Oncologysurgery ? ?Procedures:  ? ?Antimicrobials:  ?  ? ? ?Subjective: ?No n/v/abd pain.  Tolerating diet.  No complaints. ? ?Objective: ?Vitals:  ? 09/12/21 1950 09/13/21 0359 09/13/21 0500 09/13/21 0759  ?BP: 127/75  124/72  119/67  ?Pulse: 80 79  75  ?Resp: '20 18  16  '$ ?Temp: 98.1 ?F (36.7 ?C) 98.5 ?F (36.9 ?C)  98.2 ?F (36.8 ?C)  ?TempSrc:    Oral  ?SpO2: 100% 96%  99%  ?Weight:   63.2 kg   ?Height:      ? ? ?Intake/Output Summary (Last 24 hours) at 09/13/2021 0819 ?Last data filed at 09/12/2021 2157 ?Gross per 24 hour  ?Intake 2485.6 ml  ?Output 450 ml  ?Net 2035.6 ml  ? ?Filed Weights  ? 09/03/21 1200 09/08/21 1652 09/13/21 0500  ?Weight: 59.7 kg 59.7 kg 63.2 kg  ? ? ?Examination: ?Calm, NAD ?Cta no w/r ?Reg s1/s2 no gallop ?Soft benign less distended.  Hyperactive bowel sounds ?No edema ?Aaoxox3  ?Mood and affect appropriate in current setting  ? ? ? ?Data Reviewed: I have personally reviewed following labs and imaging studies ? ?CBC: ?Recent Labs  ?Lab 09/11/21 ?8527  ?WBC 11.3*  ?HGB 12.6  ?HCT 37.3  ?MCV 89.2  ?PLT 242  ? ? ?Basic Metabolic Panel: ?Recent Labs  ?Lab 09/07/21 ?0340 09/08/21 ?7824 09/09/21 ?0448 09/12/21 ?2353 09/13/21 ?6144  ?NA 133* 135 136 137  --   ?K 4.2 4.6 4.1 3.1* 4.1  ?CL 95* 96* 103 104  --   ?CO2 '28 29 27 26  '$ --   ?GLUCOSE 337* 283* 321* 107*  --   ?BUN 21 34* 38* 24*  --   ?CREATININE 0.51 0.66 0.85 0.52  --   ?CALCIUM 10.1  10.4* 9.0 9.0  --   ?MG 2.3 2.4 2.1 1.8  --   ?PHOS 3.9 4.6  --  2.7  --   ? ?GFR: ?Estimated Creatinine Clearance: 57.7 mL/min (by C-G formula based on SCr of 0.52 mg/dL). ?Liver Function Tests: ?Recent Labs  ?Lab 09/08/21 ?7209 09/12/21 ?4709  ?AST 41 29  ?ALT 74* 38  ?ALKPHOS 95 68  ?BILITOT 2.9* 1.8*  ?PROT 7.7 6.4*  ?ALBUMIN 3.3* 2.9*  ? ?No results for input(s): LIPASE, AMYLASE in the last 168 hours. ?No results for input(s): AMMONIA in the last 168 hours. ?Coagulation Profile: ?No results for input(s): INR, PROTIME in the last 168 hours. ?Cardiac Enzymes: ?No results for input(s): CKTOTAL, CKMB, CKMBINDEX, TROPONINI in the last 168 hours. ?BNP (last 3 results) ?No results for input(s): PROBNP in the last 8760 hours. ?HbA1C: ?No results for input(s): HGBA1C in the last 72  hours. ?CBG: ?Recent Labs  ?Lab 09/12/21 ?1147 09/12/21 ?1602 09/12/21 ?1948 09/12/21 ?2354 09/13/21 ?0353  ?GLUCAP 195* 211* 138* 234* 181*  ? ?Lipid Profile: ?Recent Labs  ?  09/12/21 ?0605  ?TRIG 95  ? ? ?Thyroid Function Tests: ?No results for input(s): TSH, T4TOTAL, FREET4, T3FREE, THYROIDAB in the last 72 hours. ?Anemia Panel: ?No results for input(s): VITAMINB12, FOLATE, FERRITIN, TIBC, IRON, RETICCTPCT in the last 72 hours. ?Sepsis Labs: ?No results for input(s): PROCALCITON, LATICACIDVEN in the last 168 hours. ? ?No results found for this or any previous visit (from the past 240 hour(s)). ?  ? ? ? ? ? ?Radiology Studies: ?DG UGI W SINGLE CM (SOL OR THIN BA) ? ?Addendum Date: 09/12/2021   ?ADDENDUM REPORT: 09/12/2021 12:37 ADDENDUM: The fluoroscopic imaging was acquired by Tsosie Billing, PA-C. Electronically Signed   By: Kellie Simmering D.O.   On: 09/12/2021 12:37  ? ?Result Date: 09/12/2021 ?CLINICAL DATA:  Provided history: Status post bypass gastrojejunostomy on 09/08/2021. EXAM: UPPER GI SERIES WITH KUB TECHNIQUE: After obtaining a scout radiograph, a problem oriented upper GI series was performed to assess for leak or obstruction using water-soluble contrast. FLUOROSCOPY: Fluoroscopy Time:  3 minutes, 30 seconds. Radiation Exposure Index (if provided by the fluoroscopic device): 82.2 mGy Number of Acquired Spot Images: 4 COMPARISON:  Abdominal radiograph 09/05/2021. Upper GI series 09/05/2021. FINDINGS: A scout radiograph of the abdomen was obtained. Unchanged position of a gastrostomy tube. No dilated loops of small bowel to suggest small bowel obstruction. Residual contrast within the colon. There is prompt passage of contrast from the distal esophagus into the stomach. Unremarkable appearance of the distal esophagus and GE junction. Unremarkable appearance of the proximal and mid stomach. Prompt passage of contrast from the distal stomach into the proximal jejunum. No evidence of significant obstruction at  the gastrojejunostomy site. No extraluminal contrast is demonstrated to suggest leak. Contrast also opacifies the proximal duodenum. IMPRESSION: No evidence of leak status post bypass gastrojejunostomy. No evidence of obstruction at the gastrojejunostomy site or within the proximal jejunum. Contrast opacifies only the proximal duodenum, and the duodenum likely remains obstructed. Electronically Signed: By: Kellie Simmering D.O. On: 09/12/2021 11:27   ? ? ? ? ? ?Scheduled Meds: ? Chlorhexidine Gluconate Cloth  6 each Topical Daily  ? enoxaparin (LOVENOX) injection  40 mg Subcutaneous Q24H  ? feeding supplement  1 Container Oral TID BM  ? hydrochlorothiazide  25 mg Oral Daily  ? insulin aspart  0-20 Units Subcutaneous Q4H  ? insulin glargine-yfgn  46 Units Subcutaneous Daily  ? levothyroxine  75 mcg Oral Q0600  ?  pantoprazole (PROTONIX) IV  40 mg Intravenous Q12H  ? scopolamine  1 patch Transdermal Q72H  ? sodium chloride flush  10-40 mL Intracatheter Q12H  ? sodium chloride flush  5 mL Intracatheter Q8H  ? ?Continuous Infusions: ? sodium chloride Stopped (09/08/21 1936)  ? TPN ADULT (ION) 90 mL/hr at 09/12/21 1828  ? ? ?Assessment & Plan: ?  ?Principal Problem: ?  Obstructive jaundice ?Active Problems: ?  Primary malignant neoplasm of breast with metastasis ([place]) ?  Gastric outlet obstruction ?  Elevated lipase ?  Abnormal LFTs ?  UTI (urinary tract infection) ?  History of cholecystectomy ?  Uncontrolled type 2 diabetes mellitus with hyperglycemia, without long-term current use of insulin ([place]) ?  Adult hypothyroidism ?  Hypertension ?  Hypokalemia ?  Stenosis of duodenum ?  Constipation ?  Hypophosphatemia ?  Goals of care, counseling/discussion ?  Palliative care encounter ? ?66 y.o. 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 ? ? ? ?Obstructive jaundice ?Presented with jaundice,  with history of prior cholecystectomy, with elevated LFTs, lipase. ?GI was consulted and recommended ERCP. ?ERCP was attempted on 3/21 but unable to be performed due to food and liquid residue within t

## 2021-09-13 NOTE — Progress Notes (Signed)
[facility] ?SURGICAL PROGRESS NOTE ? ?Hospital Day(s): 15.  ? ?Post op day(s): 5 Days Post-Op.  ? ?Interval History:  ?Patient seen and examined ?No acute events or new complaints overnight.  ?Patient reports she is feeling good; abdominal pain improving ?No fever, chills, nausea, emesis ?She is having loose stools; biggest complaint  ?No electrolyte derangements ?Biliary drain with 450 ccs out; bilious ?UGI on 04/03 without leak ?Started on CLD; tolerating well; she is hungry  ?She continues on TPN ? ? ?Vital signs in last 24 hours: [min-max] current  ?Temp:  [98 ?F (36.7 ?C)-98.5 ?F (36.9 ?C)] 98.5 ?F (36.9 ?C) (04/04 0359) ?Pulse Rate:  [77-82] 79 (04/04 0359) ?Resp:  [16-20] 18 (04/04 0359) ?BP: (90-132)/(62-75) 124/72 (04/04 0359) ?SpO2:  [96 %-100 %] 96 % (04/04 0359) ?Weight:  [63.2 kg] 63.2 kg (04/04 0500)     Height: 5' 3.5" (161.3 cm) Weight: 63.2 kg BMI (Calculated): 24.29  ? ?Intake/Output last 2 shifts:  ?04/03 0701 - 04/04 0700 ?In: 2485.6 [P.O.:480; I.V.:1990.4; IV Piggyback:10.2] ?Out: 450 [Drains:450]  ? ?Physical Exam:  ?Constitutional: alert, cooperative and no distress  ?Respiratory: breathing non-labored at rest  ?Cardiovascular: regular rate and sinus rhythm  ?Gastrointestinal: soft, incisional soreness, and non-distended, no rebound/guarding. Biliary drain present; output remains bilious ?Musculoskeletal: no edema or wounds, motor and sensation grossly intact, NT  ? ?Labs:  ? ?  Latest Ref Rng & Units 09/11/2021  ?  5:54 AM 09/02/2021  ?  3:47 AM 09/01/2021  ?  4:11 AM  ?CBC  ?WBC 4.0 - 10.5 K/uL 11.3   10.4   11.1    ?Hemoglobin 12.0 - 15.0 g/dL 12.6   14.3   13.8    ?Hematocrit 36.0 - 46.0 % 37.3   42.3   41.4    ?Platelets 150 - 400 K/uL 242   274   260    ? ? ?  Latest Ref Rng & Units 09/13/2021  ?  6:16 AM 09/12/2021  ?  6:03 AM 09/09/2021  ?  4:48 AM  ?CMP  ?Glucose 70 - 99 mg/dL  107   321    ?BUN 8 - 23 mg/dL  24   38    ?Creatinine 0.44 - 1.00 mg/dL  0.52   0.85    ?Sodium  135 - 145 mmol/L  137   136    ?Potassium 3.5 - 5.1 mmol/L 4.1   3.1   4.1    ?Chloride 98 - 111 mmol/L  104   103    ?CO2 22 - 32 mmol/L  26   27    ?Calcium 8.9 - 10.3 mg/dL  9.0   9.0    ?Total Protein 6.5 - 8.1 g/dL  6.4     ?Total Bilirubin 0.3 - 1.2 mg/dL  1.8     ?Alkaline Phos 38 - 126 U/L  68     ?AST 15 - 41 U/L  29     ?ALT 0 - 44 U/L  38     ? ? ? ?Imaging studies: No new pertinent imaging studies ? ? ?Assessment/Plan:  ?66 y.o. 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 ? - Advance to full liquids this morning; wrote to advance to soft diet later this afternoon/evening ? - We can go to 1/2 rate TPN today. Anticipate cessation of this tomorrow if tolerates diet advancement today ?         -  Monitor abdominal examination; on-going bowel function  ?         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 ? - Discharge Planning: Working on diet advancement this morning, wean from TPN over next 24 hours. If she does well, she may be able to discharge home tomorrow.  ? ?All of the above findings and recommendations were discussed with the patient, patient's family (daughter at bedside), and the medical team, and all of patient's and family's questions were answered to their expressed satisfaction. ? ?-- ?Edison Simon, PA-C ?[facility] ?09/13/2021, 7:50 AM ?[phone] ?M-F: 7am - 4pm ? ?

## 2021-09-13 NOTE — Telephone Encounter (Signed)
Oral Oncology Patient Advocate Encounter ? ?An urgent appeal has been submitted for the prior authorization denial of [employer].  ? ?Appeal packet was faxed to [phone] on 09/12/21.   ? ?The insurance company states that a 72 hour turn around time is to be expected for urgent redeterminations of denial.  ? ?This encounter will continue to be updated until final appeal determination.   ? ? ?

## 2021-09-13 NOTE — Telephone Encounter (Signed)
Oral Oncology Patient Advocate Encounter ? ?Prior Authorization denial has been overturned for [employer].   ? ?Effective dates: 06/12/21 through 06/11/22 ? ?Patients co-pay is $3596.24. ? ?Oral Oncology Clinic will continue to follow.  ? ?Dennison Nancy CPHT ?Specialty Pharmacy Patient Advocate ?[place] ?Phone [phone] ?Fax [phone] ?09/13/2021 10:29 AM ? ?

## 2021-09-14 DIAGNOSIS — E1165 Type 2 diabetes mellitus with hyperglycemia: Secondary | ICD-10-CM

## 2021-09-14 LAB — GLUCOSE, CAPILLARY
Glucose-Capillary: 179 mg/dL — ABNORMAL HIGH (ref 70–99)
Glucose-Capillary: 159 mg/dL — ABNORMAL HIGH (ref 70–99)
Glucose-Capillary: 176 mg/dL — ABNORMAL HIGH (ref 70–99)
Glucose-Capillary: 182 mg/dL — ABNORMAL HIGH (ref 70–99)

## 2021-09-14 MED ORDER — PANTOPRAZOLE SODIUM 40 MG PO TBEC
40.0000 mg | DELAYED_RELEASE_TABLET | Freq: Every day | ORAL | Status: DC
  Administered 2021-09-14: 40 mg via ORAL
  Filled 2021-09-14: qty 1

## 2021-09-14 MED ORDER — ADULT MULTIVITAMIN W/MINERALS CH
1.0000 | ORAL_TABLET | Freq: Every day | ORAL | Status: DC
Start: 2021-09-15 — End: 2021-11-01

## 2021-09-14 MED ORDER — ONDANSETRON HCL 4 MG PO TABS: 4.0000 mg | ORAL_TABLET | Freq: Every day | ORAL | 1 refills | Status: DC | PRN

## 2021-09-14 MED ORDER — ENSURE ENLIVE PO LIQD
237.0000 mL | Freq: Three times a day (TID) | ORAL | 12 refills | Status: DC
Start: 2021-09-14 — End: 2022-03-21

## 2021-09-14 MED ORDER — PROCHLORPERAZINE MALEATE 5 MG PO TABS
5.0000 mg | ORAL_TABLET | Freq: Four times a day (QID) | ORAL | 1 refills | Status: DC | PRN
Start: 2021-09-14 — End: 2021-12-07

## 2021-09-14 MED ORDER — PANTOPRAZOLE SODIUM 40 MG PO TBEC: 40.0000 mg | DELAYED_RELEASE_TABLET | Freq: Every day | ORAL | 11 refills | Status: DC

## 2021-09-14 MED ORDER — PROMETHAZINE HCL 12.5 MG PO TABS: 12.5000 mg | ORAL_TABLET | Freq: Four times a day (QID) | ORAL | 2 refills | Status: DC | PRN

## 2021-09-14 MED ORDER — OXYCODONE-ACETAMINOPHEN 5-325 MG PO TABS: 1.0000 | ORAL_TABLET | ORAL | 0 refills | Status: DC | PRN

## 2021-09-14 MED ORDER — SCOPOLAMINE 1 MG/3DAYS TD PT72
1.0000 | MEDICATED_PATCH | TRANSDERMAL | 12 refills | Status: DC
Start: 2021-09-16 — End: 2021-10-10

## 2021-09-14 NOTE — Progress Notes (Addendum)
[facility] ?SURGICAL PROGRESS NOTE ? ?Hospital Day(s): 16.  ? ?Post op day(s): 6 Days Post-Op.  ? ?Interval History:  ?Patient seen and examined ?No acute events or new complaints overnight.  ?Patient reports she is doing well this morning ?Some incisional soreness expectedly ?No fever, chills, nausea, emesis ?No electrolyte derangements ?Biliary drain with 470 ccs out; bilious ?Started on FLD; tolerating well; she is hungry  ?She continues on TPN; now 1/2 rate ? ?Vital signs in last 24 hours: [min-max] current  ?Temp:  [98 ?F (36.7 ?C)-98.8 ?F (37.1 ?C)] 98.8 ?F (37.1 ?C) (04/05 0356) ?Pulse Rate:  [75-86] 83 (04/05 0356) ?Resp:  [16-20] 20 (04/05 0356) ?BP: (119-123)/(67-83) 121/82 (04/05 0356) ?SpO2:  [97 %-99 %] 99 % (04/05 0356)     Height: 5' 3.5" (161.3 cm) Weight: 63.2 kg BMI (Calculated): 24.29  ? ?Intake/Output last 2 shifts:  ?04/04 0701 - 04/05 0700 ?In: 2722.4 [P.O.:520; I.V.:2197.4] ?Out: 470 [Drains:470]  ? ?Physical Exam:  ?Constitutional: alert, cooperative and no distress  ?Respiratory: breathing non-labored at rest  ?Cardiovascular: regular rate and sinus rhythm  ?Gastrointestinal: soft, incisional soreness, and non-distended, no rebound/guarding. Biliary drain present; output remains bilious ?Musculoskeletal: no edema or wounds, motor and sensation grossly intact, NT  ? ?Labs:  ? ?  Latest Ref Rng & Units 09/11/2021  ?  5:54 AM 09/02/2021  ?  3:47 AM 09/01/2021  ?  4:11 AM  ?CBC  ?WBC 4.0 - 10.5 K/uL 11.3   10.4   11.1    ?Hemoglobin 12.0 - 15.0 g/dL 12.6   14.3   13.8    ?Hematocrit 36.0 - 46.0 % 37.3   42.3   41.4    ?Platelets 150 - 400 K/uL 242   274   260    ? ? ?  Latest Ref Rng & Units 09/13/2021  ?  6:16 AM 09/12/2021  ?  6:03 AM 09/09/2021  ?  4:48 AM  ?CMP  ?Glucose 70 - 99 mg/dL  107   321    ?BUN 8 - 23 mg/dL  24   38    ?Creatinine 0.44 - 1.00 mg/dL  0.52   0.85    ?Sodium 135 - 145 mmol/L  137   136    ?Potassium 3.5 - 5.1 mmol/L 4.1   3.1   4.1    ?Chloride 98 - 111 mmol/L   104   103    ?CO2 22 - 32 mmol/L  26   27    ?Calcium 8.9 - 10.3 mg/dL  9.0   9.0    ?Total Protein 6.5 - 8.1 g/dL  6.4     ?Total Bilirubin 0.3 - 1.2 mg/dL  1.8     ?Alkaline Phos 38 - 126 U/L  68     ?AST 15 - 41 U/L  29     ?ALT 0 - 44 U/L  38     ? ? ? ?Imaging studies: No new pertinent imaging studies ? ? ?Assessment/Plan:  ?66 y.o. 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 ? - Advance to soft diet ? - I think we can discontinue TPN today ?         - Monitor abdominal examination; on-going bowel function  ?         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 ? - Discharge Planning: Stable for discharge from surgical perspective; reviewed DC instructions. We can see her in the office in a few weeks for re-check  ? ?All of the above findings and recommendations were discussed with the patient, patient's family (daughter at bedside), and the medical team, and all of patient's and family's questions were answered to their expressed satisfaction. ? ?-- ?Edison Simon, PA-C ?[facility] ?09/14/2021, 7:34 AM ?[phone] ?M-F: 7am - 4pm ? ?

## 2021-09-14 NOTE — TOC Progression Note (Signed)
Transition of Care (TOC) - Progression Note  ? ? ?Patient Details  ?Name: TARIKA MCKETHAN ?MRN: [id number] ?Date of Birth: [birth year] ? ?Transition of Care (TOC) CM/SW Contact  ?Beverly Sessions, RN ?Phone Number: ?09/14/2021, 2:19 PM ? ?Clinical Narrative:    ? ?Discussed in rounds.  Patient will not require TPN at discharge.  Per MD shouldn't have a skilled need for nursing at discharge. Malachy Mood with [facility] updated  ? ?  ?  ? ?Expected Discharge Plan and Services ?  ?  ?  ?  ?  ?                ?  ?  ?  ?  ?  ?  ?  ?  ?  ?  ? ? ?Social Determinants of Health (SDOH) Interventions ?  ? ?Readmission Risk Interventions ?   ? View : No data to display.  ?  ?  ?  ? ? ?

## 2021-09-14 NOTE — Discharge Instructions (Addendum)
In addition to included general post-operative instructions, ? ?Diet: Resume home diet. Recommend sticking with softer/mushy diet for first few days after going home. Can gradually introduce all foods. Given the biliary obstruction and need for drain, you may (or may not) get diarrhea with fatty foods  ? ?Activity: No heavy lifting >20 pounds (children, pets, laundry, garbage) for 4 weeks, but light activity and walking are encouraged. Do not drive or drink alcohol if taking narcotic pain medications or having pain that might distract from driving. ? ?Wound care: If you can keep drain site covered, You may shower/get incision wet with soapy water and pat dry (do not rub incisions), but no baths or submerging incision underwater until follow-up.  ? ?Medications: Resume all home medications. For mild to moderate pain: acetaminophen (Tylenol) or ibuprofen/naproxen (if no kidney disease). Combining Tylenol with alcohol can substantially increase your risk of causing liver disease. Narcotic pain medications, if prescribed, can be used for severe pain, though may cause nausea, constipation, and drowsiness. Do not combine Tylenol and Percocet (or similar) within a 6 hour period as Percocet (and similar) contain(s) Tylenol. If you do not need the narcotic pain medication, you do not need to fill the prescription. ? ?Call office [phone] / [phone]) at any time if any questions, worsening pain, fevers/chills, bleeding, drainage from incision site, or other concerns. ? ? ?Patient to call blood sugars daily to Dr. Maryland Pink every day starting 4/6 ? ?

## 2021-09-14 NOTE — Progress Notes (Signed)
Nutrition Follow-up ? ?DOCUMENTATION CODES:  ? ?Not applicable ? ?INTERVENTION:  ? ?Ensure Enlive po TID, each supplement provides 350 kcal and 20 grams of protein. ? ?MVI po daily  ? ?NUTRITION DIAGNOSIS:  ? ?Increased nutrient needs related to acute illness, catabolic illness as evidenced by estimated needs. ? ?GOAL:  ? ?Patient will meet greater than or equal to 90% of their needs ?-met with TPN ? ?MONITOR:  ? ?PO intake, Supplement acceptance, Labs, Weight trends, Skin, I & O's ? ?ASSESSMENT:  ? ?66 y/o female with h/o HTN, HLD, anxiety and GERD who is admitted with GOO secondary to malignant obstruction of the duodenum, biopsy proven breast primary from left breast mass now s/p robotic assisted laparoscopic gastrojejunostomy 3/30. ? ?Spoke with pt today. Pt reports fairly good appetite and oral intake in hospital; pt reports that she is trying to eat frequent small meals and reports that she is drinking all of her Ensure supplements. Pt documented to have eaten 90-100% of meals yesterday. Pt reports eating yogurt with coffee for breakfast this morning and drank an Ensure. Pt with questions regarding her diet after discharge. RD discussed with pt the importance of adequate nutrition needed to preserve lean muscle and to support post op healing. RD also provided pt with post gastrojejunostomy diet education. Recommended for pt to continue supplements and daily MVI after discharge. Per chart, pt appears weight stable since admit. Will refer patient to the Dietitian at the [facility] as pt will be at increased risk for nutrient deficiencies r/t to her gastric surgery.  ? ?Medications reviewed and include: lovenox, hydrochlorothiazide, insulin, synthroid, MVI, protonix, scopolamine ? ?Labs reviewed: K 4.1 wnl, BUN 24(H), P 2.7 wnl, Mg 1.8 wnl ?Triglycerides 95- 4/3 ?Wbc- 11.3(H) ?Cbgs- 176, 159, 179 x 24 hrs ? ?Diet Order:   ?Diet Order   ? ?       ?  DIET SOFT Room service appropriate? Yes; Fluid consistency:  Thin  Diet effective now       ?  ? ?  ?  ? ?  ? ?EDUCATION NEEDS:  ? ?Not appropriate for education at this time ? ?Skin:  Skin Assessment: Reviewed RN Assessment (incision abdomen) ? ?Last BM:  4/4- type 7 ? ?Height:  ? ?Ht Readings from Last 1 Encounters:  ?09/08/21 5' 3.5" (1.613 m)  ? ? ?Weight:  ? ?Wt Readings from Last 1 Encounters:  ?09/13/21 63.2 kg  ? ? ?Ideal Body Weight:  53.4 kg ? ?BMI:  Body mass index is 24.29 kg/m?. ? ?Estimated Nutritional Needs:  ? ?Kcal:  1700-2000kcal/day ? ?Protein:  85-100g/day ? ?Fluid:  1.4-1.6L/day ? ?Koleen Distance MS, RD, LDN ?Please refer to [facility] for RD and/or RD on-call/weekend/after hours pager ? ?

## 2021-09-14 NOTE — Progress Notes (Signed)
Chaplain Maggie made follow up visit with patient. She shared anticipation about going home and emphasized, "I am looking forward to it." Pt was surrounded by signs of love of family and friends. Flowers, figurines, and a stuffed rabbit represented how much the patient is cared for by others. No follow up expected due to expected discharge. ?

## 2021-09-15 ENCOUNTER — Other Ambulatory Visit (HOSPITAL_COMMUNITY): Payer: Self-pay

## 2021-09-15 MED ORDER — RIBOCICLIB SUCC (600 MG DOSE) 200 MG PO TBPK
600.0000 mg | ORAL_TABLET | Freq: Every day | ORAL | 0 refills | Status: DC
  Filled 2021-09-15 (×2): qty 63, 21d supply, fill #0

## 2021-09-15 NOTE — Discharge Summary (Signed)
?Physician Discharge Summary ?  ?Patient: Diamond Hinton MRN: [id number] DOB: 1955/07/19  ?Admit date:     08/29/2021  ?Discharge date: 09/14/2021  ?Discharge Physician: Annita Brod  ? ?PCP: Virginia Crews, MD  ? ?Recommendations at discharge:  ? ?Patient will follow-up with oncology for initiation of chemotherapy as outpatient ?Patient to follow-up with general surgery for follow-up following gastrojejunostomy ?Patient will continue to check CBGs and report these and daily to hospitalist attending ?New medications: As needed Zofran, as needed Compazine for refractory nausea and vomiting ?New medications: As needed Percocet for pain, scopolamine patch ? ?Discharge Diagnoses: ?Principal Problem: ?  Obstructive jaundice ?Active Problems: ?  Primary malignant neoplasm of breast with metastasis ([place]) ?  Gastric outlet obstruction ?  Elevated lipase ?  Abnormal LFTs ?  UTI (urinary tract infection) ?  History of cholecystectomy ?  Uncontrolled type 2 diabetes mellitus with hyperglycemia, without long-term current use of insulin ([place]) ?  Adult hypothyroidism ?  Hypertension ?  Hypokalemia ?  Stenosis of duodenum ?  Constipation ?  Hypophosphatemia ?  Goals of care, counseling/discussion ?  Palliative care encounter ? ?Resolved Problems: ?  * No resolved hospital problems. * ? ?Hospital Course: ?66 year old female with past medical history significant for diabetes, hypertension, hypothyroidism sent to the emergency room on 3/20 by her PCP for abnormal labs.  In the emergency room, lab work noted transaminitis, elevated bilirubin as well as a large urinary tract infection.  CT abdomen and pelvis shows marked distention of the stomach finding suggestive of possible benign or malignant stricture. Left breast mass with slightly spiculated margins also noted. ? ?GI was consulted.  They attempted ERCP on 3/21, but unable to proceed due to food and liquid residue in the stomach.  A severe stricture in first portion of  duodenum was noted and biopsied.  IR consulted for biliary drain placement on 3/22, but unable to advance drain past sphincter of Oddi.  Returned to IR on 3/24 for advancement of drain.  Preliminary biopsy results showing this is likely metastatic breast cancer.  Patient started on TPN.  On 3/30, patient underwent robotic assisted laparoscopic gastrojejunostomy.  Oncology who plan to see patient in office and start chemotherapy.  Once obstruction resolves, patient was slowly started back on p.o. and by 4/5, able to tolerate p.o. ? ?Assessment and Plan: ?* Obstructive jaundice ?Presented with jaundice, with history of prior cholecystectomy, with elevated LFTs, lipase. ?GI was consulted and recommended ERCP. ?ERCP was attempted on 3/21 but unable to be performed due to food and liquid residue within the stomach.  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  Initially requiring TPN.  Patient underwent gastrojejunostomy on 3/30.  Once gastric outlet obstruction bypassed, patient able to tolerate p.o. and by 4/5, tolerating soft diet.  Felt to be medically stable for discharge with plans to follow-up with general surgery. ? ? ?Elevated lipase ?No focal abnormality seen in the pancreas on CT.  Suspect related to obstructive process. ?-- Biliary drain placed by IR on 3/22 ? ?Gastric outlet obstruction ?Due to metastatic breast cancer. ?MRCP on 3/21 showed masslike duodenal wall thickening extending to the ampulla, worrisome for duodenal/ampullary carcinoma. ...intrahepatic/extrahepatic ductal dilatation.... prominent pancreatic duct, extending to the ampulla... 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   ?--2.3 cm irregular/spiculated lesion in the left inferior breast, suspicious for primary breast neoplasm.  ? ?General surgery placed G-tube and able to relieve obstruction.  Patient able  to start taking p.o. ? ?Primary malignant neoplasm of breast  with metastasis ([place]) ?New diagnosis this admission.  Presented with biliary obstructive symptoms and noted to have spiculated mass in left breast on imaging upon admission.  Biopsied during attempted ERCP and pathology has returned showing invasive lobular carcinoma.  Seen by oncology who plan to start chemotherapy as outpatient. ? ?Uncontrolled type 2 diabetes mellitus with hyperglycemia, without long-term current use of insulin ([place]) ?After discussion with diabetes coordinator, patient will be discharged without insulin.  She will check her sugars and then report in daily to myself, hospitalist attending to see if she needs to start on Lantus. ? ? ? ?History of cholecystectomy ?Noted history in the setting of current suspected obstructive jaundice ? ?UTI (urinary tract infection) ?Patient treated with Rocephin in the ED. ?She denies any urinary symptoms. ?Has been on Zosyn to prevent cholangitis, which was discontinued on 3/28. ? ?Abnormal LFTs ?Hepatic steatosis on CT with dilatation of intra and extra biliary ducts.   ?Concern for malignant process, as outlined. ?Improving since biliary drain placement ?--Follow CMP ? ?Goals of care, counseling/discussion ?Palliative care met with patient today. ?DNR/DNI.  Otherwise continue full scope of care. ? ?Hypophosphatemia ?Phos 2.1 on 3/27, given K-phos IV  ?Monitor and replace as needed. ? ?Constipation ?Continue MiraLAX.  Dulcolax suppository if needed or not tolerating liquids for MiraLAX. ? ?Stenosis of duodenum ?Confirmed metastatic breast cancer. ?Biliary drain placed by IR 3/22. ?Surgery did laparoscopic gastrojejunostomy which relieved obstruction ?\\ ? ?Hypokalemia ?Ongoing replacement as needed. ? ? ?Hypertension ?Blood pressure remained stable during hospitalization ? ?Adult hypothyroidism ?On IV levothyroxine while n.p.o. ?Resumed on p.o. Synthroid once able to take p.o. when tolerating. ? ? ? ? ?  ? ?Pain control - [employer] Controlled Substance  Reporting System database was reviewed. and patient was instructed, not to drive, operate heavy machinery, perform activities at heights, swimming or participation in water activities or provide baby-sitting services while on Pain, Sleep and Anxiety Medications; until their outpatient Physician has advised to do so again. Also recommended to not to take more than prescribed Pain, Sleep and Anxiety Medications.  ?Consultants: Interventional radiology ?General surgery ?Gastroenterology ?Oncology ?Palliative care ? ?Procedures performed:  ?Biliary drain placement ?Attempted ERCP ?Gastrojejunostomy ? ?Disposition: Home ?Diet recommendation:  ?Discharge Diet Orders (From admission, onward)  ? ?  Start     Ordered  ? 09/14/21 0000  Diet - low sodium heart healthy       ? 09/14/21 1633  ? ?  ?  ? ?  ? ? ?DISCHARGE MEDICATION: ?Allergies as of 09/14/2021   ? ?   Reactions  ? Peanuts [peanut Oil] Shortness Of Breath, Itching  ? ?  ? ?  ?Medication List  ?  ? ?TAKE these medications   ? ?famotidine 10 MG tablet ?Commonly known as: PEPCID ?Take 1 tablet (10 mg total) by mouth 2 (two) times daily. ?  ?feeding supplement Liqd ?Take 237 mLs by mouth 3 (three) times daily between meals. ?  ?hydrochlorothiazide 25 MG tablet ?Commonly known as: HYDRODIURIL ?Take 1 tablet (25 mg total) by mouth daily. ?  ?levothyroxine 75 MCG tablet ?Commonly known as: SYNTHROID ?Take 1 tablet (75 mcg total) by mouth daily. ?  ?multivitamin with minerals Tabs tablet ?Take 1 tablet by mouth daily. ?  ?ondansetron 4 MG tablet ?Commonly known as: Zofran ?Take 1 tablet (4 mg total) by mouth daily as needed for nausea or vomiting. ?  ?oxyCODONE-acetaminophen 5-325 MG tablet ?Commonly known  as: Percocet ?Take 1 tablet by mouth every 4 (four) hours as needed for severe pain. ?  ?pantoprazole 40 MG tablet ?Commonly known as: Protonix ?Take 1 tablet (40 mg total) by mouth daily. ?  ?prochlorperazine 5 MG tablet ?Commonly known as: COMPAZINE ?Take 1 tablet (5 mg  total) by mouth every 6 (six) hours as needed for refractory nausea / vomiting. ?  ?promethazine 12.5 MG tablet ?Commonly known as: PHENERGAN ?Take 1 tablet (12.5 mg total) by mouth every 6 (six) hours as ne

## 2021-09-15 NOTE — Addendum Note (Signed)
Addended by: Darl Pikes on: 09/15/2021 02:40 PM ? ? Modules accepted: Orders ? ?

## 2021-09-16 ENCOUNTER — Other Ambulatory Visit (HOSPITAL_COMMUNITY): Payer: Self-pay

## 2021-09-16 LAB — HEMOGLOBIN A1C
Hgb A1c MFr Bld: 6.9 % — ABNORMAL HIGH (ref 4.8–5.6)
Mean Plasma Glucose: 151 mg/dL

## 2021-09-20 ENCOUNTER — Inpatient Hospital Stay: Payer: Medicare HMO | Attending: Oncology

## 2021-09-20 ENCOUNTER — Inpatient Hospital Stay: Payer: Medicare HMO

## 2021-09-20 ENCOUNTER — Encounter: Payer: Self-pay | Admitting: Oncology

## 2021-09-20 ENCOUNTER — Inpatient Hospital Stay (HOSPITAL_BASED_OUTPATIENT_CLINIC_OR_DEPARTMENT_OTHER): Payer: Medicare HMO | Admitting: Oncology

## 2021-09-20 ENCOUNTER — Telehealth: Payer: Self-pay | Admitting: Pharmacy Technician

## 2021-09-20 ENCOUNTER — Inpatient Hospital Stay: Payer: Medicare HMO | Admitting: Pharmacist

## 2021-09-20 ENCOUNTER — Other Ambulatory Visit (HOSPITAL_COMMUNITY): Payer: Self-pay

## 2021-09-20 VITALS — BP 113/79 | HR 79 | Temp 97.8°F | Resp 16 | Ht 63.5 in | Wt 129.4 lb

## 2021-09-20 DIAGNOSIS — Z79899 Other long term (current) drug therapy: Secondary | ICD-10-CM | POA: Insufficient documentation

## 2021-09-20 DIAGNOSIS — N179 Acute kidney failure, unspecified: Secondary | ICD-10-CM | POA: Insufficient documentation

## 2021-09-20 DIAGNOSIS — K831 Obstruction of bile duct: Secondary | ICD-10-CM | POA: Diagnosis not present

## 2021-09-20 DIAGNOSIS — Z170 Estrogen receptor positive status [ER+]: Secondary | ICD-10-CM | POA: Insufficient documentation

## 2021-09-20 DIAGNOSIS — I10 Essential (primary) hypertension: Secondary | ICD-10-CM | POA: Insufficient documentation

## 2021-09-20 DIAGNOSIS — C784 Secondary malignant neoplasm of small intestine: Secondary | ICD-10-CM | POA: Diagnosis not present

## 2021-09-20 DIAGNOSIS — D582 Other hemoglobinopathies: Secondary | ICD-10-CM | POA: Diagnosis not present

## 2021-09-20 DIAGNOSIS — Z7189 Other specified counseling: Secondary | ICD-10-CM | POA: Diagnosis not present

## 2021-09-20 DIAGNOSIS — E876 Hypokalemia: Secondary | ICD-10-CM | POA: Diagnosis not present

## 2021-09-20 DIAGNOSIS — Z7984 Long term (current) use of oral hypoglycemic drugs: Secondary | ICD-10-CM | POA: Diagnosis not present

## 2021-09-20 DIAGNOSIS — C50912 Malignant neoplasm of unspecified site of left female breast: Secondary | ICD-10-CM | POA: Insufficient documentation

## 2021-09-20 DIAGNOSIS — E1165 Type 2 diabetes mellitus with hyperglycemia: Secondary | ICD-10-CM | POA: Insufficient documentation

## 2021-09-20 DIAGNOSIS — E871 Hypo-osmolality and hyponatremia: Secondary | ICD-10-CM | POA: Diagnosis not present

## 2021-09-20 DIAGNOSIS — N6320 Unspecified lump in the left breast, unspecified quadrant: Secondary | ICD-10-CM

## 2021-09-20 DIAGNOSIS — C50919 Malignant neoplasm of unspecified site of unspecified female breast: Secondary | ICD-10-CM

## 2021-09-20 LAB — CBC WITH DIFFERENTIAL/PLATELET
Abs Immature Granulocytes: 0.05 10*3/uL (ref 0.00–0.07)
Basophils Absolute: 0 10*3/uL (ref 0.0–0.1)
Basophils Relative: 0 %
Eosinophils Absolute: 0 10*3/uL (ref 0.0–0.5)
Eosinophils Relative: 0 %
HCT: 41.1 % (ref 36.0–46.0)
Hemoglobin: 14.5 g/dL (ref 12.0–15.0)
Immature Granulocytes: 0 %
Lymphocytes Relative: 10 %
Lymphs Abs: 1.5 10*3/uL (ref 0.7–4.0)
MCH: 30.9 pg (ref 26.0–34.0)
MCHC: 35.3 g/dL (ref 30.0–36.0)
MCV: 87.6 fL (ref 80.0–100.0)
Monocytes Absolute: 0.5 10*3/uL (ref 0.1–1.0)
Monocytes Relative: 3 %
Neutro Abs: 12.9 10*3/uL — ABNORMAL HIGH (ref 1.7–7.7)
Neutrophils Relative %: 87 %
Platelets: 427 10*3/uL — ABNORMAL HIGH (ref 150–400)
RBC: 4.69 MIL/uL (ref 3.87–5.11)
RDW: 15.7 % — ABNORMAL HIGH (ref 11.5–15.5)
WBC: 14.9 10*3/uL — ABNORMAL HIGH (ref 4.0–10.5)
nRBC: 0 % (ref 0.0–0.2)

## 2021-09-20 LAB — COMPREHENSIVE METABOLIC PANEL
ALT: 96 U/L — ABNORMAL HIGH (ref 0–44)
AST: 60 U/L — ABNORMAL HIGH (ref 15–41)
Albumin: 3.7 g/dL (ref 3.5–5.0)
Alkaline Phosphatase: 90 U/L (ref 38–126)
Anion gap: 12 (ref 5–15)
BUN: 25 mg/dL — ABNORMAL HIGH (ref 8–23)
CO2: 27 mmol/L (ref 22–32)
Calcium: 9.1 mg/dL (ref 8.9–10.3)
Chloride: 94 mmol/L — ABNORMAL LOW (ref 98–111)
Creatinine, Ser: 0.79 mg/dL (ref 0.44–1.00)
GFR, Estimated: >60 mL/min (ref >60)
Glucose, Bld: 209 mg/dL — ABNORMAL HIGH (ref 70–99)
Potassium: 3.1 mmol/L — ABNORMAL LOW (ref 3.5–5.1)
Sodium: 133 mmol/L — ABNORMAL LOW (ref 135–145)
Total Bilirubin: 1.9 mg/dL — ABNORMAL HIGH (ref 0.3–1.2)
Total Protein: 7.5 g/dL (ref 6.5–8.1)

## 2021-09-20 LAB — MAGNESIUM: Magnesium: 1.9 mg/dL (ref 1.7–2.4)

## 2021-09-20 MED ORDER — LETROZOLE 2.5 MG PO TABS: 2.5000 mg | ORAL_TABLET | Freq: Every day | ORAL | 3 refills | Status: DC

## 2021-09-20 NOTE — Progress Notes (Signed)
Nutrition Assessment ? ? ?Reason for Assessment:  ?Referral from Dr Janese Banks and inpatient RD team ? ? ?ASSESSMENT:  ?66 year old female with metastatic lobular left breast cancer with duodenal wall thickening presenting with gastric outlet obstruction.  S/p robotic assisted laparoscopic gastrojejunostomy 3/30.  Patient was on TPN during admission but then discontinued and progressed to soft diet. Past medical history of anemia, GERD, HLD, HTN.  Planning to start ribociclib and letrozole. ? ?Met with patient and daughter.  Patient reports poor po intake for about 1 month prior to hospital admission.  Appetite is still not that good.  Reports eating grits, eggs and coffee yesterday for breakfast. Few hours later drink protein shake (premier). Later ate jello and cottage cheese. Drank some chicken broth.  For dinner ate some noodles with chicken and alfredo sauce and pudding cup.  Had yogurt this am.  Says that she had a normal bowel movement on Sunday and yesterday.  Drinking mostly water, some juice and coffee.  Some nausea ? ? ?Medications: MVI, zofran, protonix, compazine, zofran ? ? ?Labs: reviewed ? ? ?Anthropometrics:  ? ?Height: 63.5 inches ?Weight: 129 lb 6.4 oz ?142 lb on 04/18/21 ? ?BMI: 22 ? ?9% weight loss in the last 5 months, significant ? ? ?Estimated Energy Needs ? ?Kcals: 1700-2000 ?Protein: 85-100 g ?Fluid: 1700-2023m ? ? ?NUTRITION DIAGNOSIS: Inadequate oral intake related to cancer (GOO) as evidenced by 9% weight loss in the last 5 months and poor po intake ? ? ? ?INTERVENTION:  ?Encouraged small frequent meals, including protein source ?Encouraged oral nutrition shakes (low sugar options) ?Reviewed low fiber food options to choose s/p surgery.   ?Continue MVI daily ? ? ?MONITORING, EVALUATION, GOAL: weight trends, intake ? ? ?Next Visit: Tuesday, April 25 in clinic ? ?Diamond Hinton B. Diamond Hinton RD, LDN ?Registered Dietitian ?[phone]? ? ? ?

## 2021-09-20 NOTE — Progress Notes (Signed)
Pt does not have a appetite, drinks 1-2 ensure, or boost or premier a day. She can eat some things but hard to want to eat it due to no appetite. She is anxious about nausea for her.needs dietician ref.  ?

## 2021-09-20 NOTE — Progress Notes (Signed)
? ? ? ?Hematology/Oncology Consult note ?[place]  ?[phone] [phone] ? ?Patient Care Team: ?Virginia Crews, MD as PCP - General (Family Medicine) ?Theodore Demark, RN as Oncology Nurse Navigator  ? ?Name of the patient: Diamond Hinton  ?[id number]  ?Feb 06, 1956  ? ?Date of visit: 09/20/21 ? 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 ?Chief complaint/ Reason for visit-discuss further management of metastatic breast cancer ? ?Heme/Onc history: Patient is a 66 year old female who presented to the hospital in March 2023 with symptoms of gastric outlet obstruction as well as obstructive jaundice.  She was found to have diffuse duodenal wall thickening as well as a palpable mass in the left breast.  Duodenal biopsy as well as left breast biopsy was consistent withER positive greater than 90%, PR weakly +1 to 10% HER2 negative lobular breast cancer.  Patient underwent external biliary drain for the obstructive jaundice and palliative gastrojejunostomy surgery to relieve her gastric outlet obstruction.CT scan otherwise did not show any evidence of distant metastatic disease. ? ?Interval history-patient reports that she is currently on a soft diet which she is tolerating it well without any significant nausea or vomiting.  She still feels fatigued but is trying to get more active. ? ?ECOG PS- 1 ?Pain scale- 0 ? ?Review of systems- Review of Systems  ?Constitutional:  Positive for malaise/fatigue. Negative for chills, fever and weight loss.  ?HENT:  Negative for congestion, ear discharge and nosebleeds.   ?Eyes:  Negative for blurred vision.  ?Respiratory:  Negative for cough, hemoptysis, sputum production, shortness of breath and wheezing.   ?Cardiovascular:  Negative for chest pain, palpitations, orthopnea and claudication.  ?Gastrointestinal:  Negative for abdominal pain,  blood in stool, constipation, diarrhea, heartburn, melena, nausea and vomiting.  ?Genitourinary:  Negative for dysuria, flank pain, frequency, hematuria and urgency.  ?Musculoskeletal:  Negative for back pain, joint pain and myalgias.  ?Skin:  Negative for rash.  ?Neurological:  Negative for dizziness, tingling, focal weakness, seizures, weakness and headaches.  ?Endo/Heme/Allergies:  Does not bruise/bleed easily.  ?Psychiatric/Behavioral:  Negative for depression and suicidal ideas. The patient does not have insomnia.   ? ?Allergies  ?Allergen Reactions  ? Peanuts [Peanut Oil] Shortness Of Breath and Itching  ? ? ? ?Past Medical History:  ?Diagnosis Date  ? Anemia   ? Anxiety   ? Gallstones   ? GERD (gastroesophageal reflux disease)   ? Hyperlipidemia   ? Hypertension   ? ? ? ?Past Surgical History:  ?Procedure Laterality Date  ? BILATERAL SALPINGECTOMY  04/03/1997  ? CHOLECYSTECTOMY N/A 07/07/2015  ? Procedure: LAPAROSCOPIC CHOLECYSTECTOMY ;  Surgeon: Jules Husbands, MD;  Location: [facility] ORS;  Service: General;  Laterality: N/A;  ? ESOPHAGOGASTRODUODENOSCOPY  08/30/2021  ? Procedure: ESOPHAGOGASTRODUODENOSCOPY (EGD);  Surgeon: Lucilla Lame, MD;  Location: [facility] ENDOSCOPY;  Service: Endoscopy;;  ? GALLBLADDER SURGERY  07/07/2015  ? [place]  ? IR BILIARY DRAIN PLACEMENT WITH CHOLANGIOGRAM  08/31/2021  ? IR CONVERT BILIARY DRAIN TO INT EXT BILIARY DRAIN  09/02/2021  ? ? ?Social History  ? ?Socioeconomic History  ? Marital status: Married  ?  Spouse name: Not on file  ? Number of children: Not on file  ? Years of education: Not on file  ? Highest education level: Not on file  ?Occupational History  ? Not on file  ?Tobacco Use  ? Smoking status: Never  ? Smokeless tobacco: Never  ?Vaping  Use  ? Vaping Use: Never used  ?Substance and Sexual Activity  ? Alcohol use: No  ? Drug use: No  ? Sexual activity: Yes  ?Other Topics Concern  ? Not on file  ?Social History Narrative  ? Not on file  ? ?Social Determinants of Health   ? ?Financial Resource Strain: Not on file  ?Food Insecurity: Not on file  ?Transportation Needs: Not on file  ?Physical Activity: Not on file  ?Stress: Not on file  ?Social Connections: Not on file  ?Intimate Partner Violence: Not on file  ? ? ?Family History  ?Problem Relation Age of Onset  ? Hypertension Mother   ? CVA Mother   ? Ulcers Mother   ? Heart disease Father   ? Hypertension Sister   ? Diabetes Sister   ? Hypertension Sister   ? Diabetes Sister   ? ? ? ?Current Outpatient Medications:  ?  feeding supplement (ENSURE ENLIVE / ENSURE PLUS) LIQD, Take 237 mLs by mouth 3 (three) times daily between meals., Disp: 237 mL, Rfl: 12 ?  hydrochlorothiazide (HYDRODIURIL) 25 MG tablet, Take 1 tablet (25 mg total) by mouth daily., Disp: 90 tablet, Rfl: 3 ?  levothyroxine (SYNTHROID) 75 MCG tablet, Take 1 tablet (75 mcg total) by mouth daily., Disp: 90 tablet, Rfl: 3 ?  Multiple Vitamin (MULTIVITAMIN WITH MINERALS) TABS tablet, Take 1 tablet by mouth daily., Disp: , Rfl:  ?  ondansetron (ZOFRAN) 4 MG tablet, Take 1 tablet (4 mg total) by mouth daily as needed for nausea or vomiting., Disp: 30 tablet, Rfl: 1 ?  oxyCODONE-acetaminophen (PERCOCET) 5-325 MG tablet, Take 1 tablet by mouth every 4 (four) hours as needed for severe pain., Disp: 20 tablet, Rfl: 0 ?  pantoprazole (PROTONIX) 40 MG tablet, Take 1 tablet (40 mg total) by mouth daily., Disp: 30 tablet, Rfl: 11 ?  prochlorperazine (COMPAZINE) 5 MG tablet, Take 1 tablet (5 mg total) by mouth every 6 (six) hours as needed for refractory nausea / vomiting., Disp: 30 tablet, Rfl: 1 ?  promethazine (PHENERGAN) 12.5 MG tablet, Take 1 tablet (12.5 mg total) by mouth every 6 (six) hours as needed for nausea or vomiting., Disp: 30 tablet, Rfl: 2 ?  ribociclib succ (KISQALI, 600 MG DOSE,) 200 MG Therapy Pack, Take 3 tablets (600 mg total) by mouth daily. Take for 21 days on, 7 days off, repeat every 28 days., Disp: 63 tablet, Rfl: 0 ?  scopolamine (TRANSDERM-SCOP) 1  MG/3DAYS, Place 1 patch (1.5 mg total) onto the skin every 3 (three) days., Disp: 10 patch, Rfl: 12 ? ?Physical exam:  ?Vitals:  ? 09/20/21 0855  ?BP: 113/79  ?Pulse: 79  ?Resp: 16  ?Temp: 97.8 ?F (36.6 ?C)  ?TempSrc: Oral  ?Weight: 129 lb 6.4 oz (58.7 kg)  ?Height: 5' 3.5" (1.613 m)  ? ?Physical Exam ?Cardiovascular:  ?   Rate and Rhythm: Normal rate and regular rhythm.  ?   Heart sounds: Normal heart sounds.  ?Pulmonary:  ?   Effort: Pulmonary effort is normal.  ?   Breath sounds: Normal breath sounds.  ?Abdominal:  ?   General: Bowel sounds are normal.  ?   Palpations: Abdomen is soft.  ?   Comments: External biliary drain in place.  ?Skin: ?   General: Skin is warm and dry.  ?Neurological:  ?   Mental Status: She is alert and oriented to person, place, and time.  ?  ? ? ?  Latest Ref Rng & Units 09/13/2021  ?  6:16 AM  ?CMP  ?Potassium 3.5 - 5.1 mmol/L 4.1    ? ? ?  Latest Ref Rng & Units 09/20/2021  ?  8:12 AM  ?CBC  ?WBC 4.0 - 10.5 K/uL 14.9    ?Hemoglobin 12.0 - 15.0 g/dL 14.5    ?Hematocrit 36.0 - 46.0 % 41.1    ?Platelets 150 - 400 K/uL 427    ? ? ?No images are attached to the encounter. ? ?DG Abd 1 View ? ?Result Date: 09/05/2021 ?CLINICAL DATA:  Follow up to assess clearing of contrast from stomach- 4pm timed image EXAM: ABDOMEN - 1 VIEW COMPARISON:  None. FINDINGS: Gastrostomy tube with tip overlying the mid abdomen. Slightly decreased but persistent PO contrast partially opacifying the gastric lumen. PO contrast also noted throughout the colon. No extravasation of PO contrast noted. The bowel gas pattern is normal. Surgical clips overlie the right upper abdomen. IMPRESSION: 1. Gastrostomy tube in good position. 2. Slightly decreased but persistent PO contrast partially opacifying the gastric lumen. 3.  PO contrast also noted throughout the colon. Electronically Signed   By: Iven Finn M.D.   On: 09/05/2021 16:24  ? ?DG Abd 1 View ? ?Result Date: 09/05/2021 ?CLINICAL DATA:  Duodenal obstruction EXAM:  ABDOMEN - 1 VIEW COMPARISON:  Upper GI exam 09/05/2021 FINDINGS: There is a percutaneous biliary drain overlying the upper abdomen. Nasogastric tube tip and side port overlie the and opacified stomach. There is contrast

## 2021-09-20 NOTE — Progress Notes (Addendum)
[place]  [phone] [phone]  Patient Care Team: Virginia Crews, MD as PCP - General (Family Medicine) Theodore Demark, RN as Oncology Nurse Navigator   Name of the patient: Diamond Hinton  [id number]  01/11/1956   Date of visit: 09/20/21  HPI: Patient is a 66 y.o. female with newly diagnosed metastatic breast cancer, ER positive, HER2 and PR negative. Planned treatment with Kisqali (ribociclib) and letrozole.  Reason for Consult: Ribociclib oral chemotherapy education.   PAST MEDICAL HISTORY: Past Medical History:  Diagnosis Date   Anemia    Anxiety    Breast cancer ([place])    Gallstones    GERD (gastroesophageal reflux disease)    Hyperlipidemia    Hypertension     HEMATOLOGY/ONCOLOGY HISTORY:  Oncology History  Primary malignant neoplasm of breast with metastasis ([place])  08/29/2021 Initial Diagnosis   Metastatic breast cancer ([place])   09/08/2021 -  Chemotherapy   Patient is on Treatment Plan : BREAST Abemaciclib + Fulvestrant q28d       ALLERGIES:  is allergic to peanuts [peanut oil].  MEDICATIONS:  Current Outpatient Medications  Medication Sig Dispense Refill   acetaminophen (TYLENOL) 325 MG tablet Take 325 mg by mouth every 6 (six) hours as needed (for pain and sometimes takes 2 if pain is worse).     feeding supplement (ENSURE ENLIVE / ENSURE PLUS) LIQD Take 237 mLs by mouth 3 (three) times daily between meals. 237 mL 12   hydrochlorothiazide (HYDRODIURIL) 25 MG tablet Take 1 tablet (25 mg total) by mouth daily. 90 tablet 3   letrozole (FEMARA) 2.5 MG tablet Take 1 tablet (2.5 mg total) by mouth daily. 30 tablet 3   levothyroxine (SYNTHROID) 75 MCG tablet Take 1 tablet (75 mcg total) by mouth daily. 90 tablet 3   loratadine (CLARITIN) 10 MG tablet Take 10 mg by mouth daily as needed for allergies.     Multiple Vitamin (MULTIVITAMIN WITH MINERALS) TABS tablet Take 1 tablet by mouth daily.  (Patient taking differently: Take 1 tablet by mouth daily. 5 alive)     ondansetron (ZOFRAN) 4 MG tablet Take 1 tablet (4 mg total) by mouth daily as needed for nausea or vomiting. 30 tablet 1   oxyCODONE-acetaminophen (PERCOCET) 5-325 MG tablet Take 1 tablet by mouth every 4 (four) hours as needed for severe pain. 20 tablet 0   pantoprazole (PROTONIX) 40 MG tablet Take 1 tablet (40 mg total) by mouth daily. 30 tablet 11   polyethylene glycol (MIRALAX / GLYCOLAX) 17 g packet Take 17 g by mouth daily as needed.     prochlorperazine (COMPAZINE) 5 MG tablet Take 1 tablet (5 mg total) by mouth every 6 (six) hours as needed for refractory nausea / vomiting. 30 tablet 1   promethazine (PHENERGAN) 12.5 MG tablet Take 1 tablet (12.5 mg total) by mouth every 6 (six) hours as needed for nausea or vomiting. 30 tablet 2   ribociclib succ (KISQALI, 600 MG DOSE,) 200 MG Therapy Pack Take 3 tablets (600 mg total) by mouth daily. Take for 21 days on, 7 days off, repeat every 28 days. (Patient not taking: Reported on 09/20/2021) 63 tablet 0   scopolamine (TRANSDERM-SCOP) 1 MG/3DAYS Place 1 patch (1.5 mg total) onto the skin every 3 (three) days. 10 patch 12   No current facility-administered medications for this visit.    VITAL SIGNS: There were no vitals taken for this visit. There were no vitals filed for this visit.  Estimated body mass index is 22.56 kg/m as calculated from the following:   Height as of an earlier encounter on 09/20/21: 5' 3.5" (1.613 m).   Weight as of an earlier encounter on 09/20/21: 58.7 kg (129 lb 6.4 oz).  LABS: CBC:    Component Value Date/Time   WBC 14.9 (H) 09/20/2021 0812   HGB 14.5 09/20/2021 0812   HGB 15.4 12/01/2015 0829   HCT 41.1 09/20/2021 0812   HCT 43.6 12/01/2015 0829   PLT 427 (H) 09/20/2021 0812   PLT 258 12/01/2015 0829   MCV 87.6 09/20/2021 0812   MCV 85 12/01/2015 0829   NEUTROABS 12.9 (H) 09/20/2021 0812   NEUTROABS 5.0 12/01/2015 0829   LYMPHSABS 1.5  09/20/2021 0812   LYMPHSABS 2.4 12/01/2015 0829   MONOABS 0.5 09/20/2021 0812   EOSABS 0.0 09/20/2021 0812   EOSABS 0.1 12/01/2015 0829   BASOSABS 0.0 09/20/2021 0812   BASOSABS 0.0 12/01/2015 0829   Comprehensive Metabolic Panel:    Component Value Date/Time   NA 133 (L) 09/20/2021 0812   NA 140 04/20/2021 0833   K 3.1 (L) 09/20/2021 0812   CL 94 (L) 09/20/2021 0812   CO2 27 09/20/2021 0812   BUN 25 (H) 09/20/2021 0812   BUN 13 04/20/2021 0833   CREATININE 0.79 09/20/2021 0812   GLUCOSE 209 (H) 09/20/2021 0812   CALCIUM 9.1 09/20/2021 0812   AST 60 (H) 09/20/2021 0812   ALT 96 (H) 09/20/2021 0812   ALKPHOS 90 09/20/2021 0812   BILITOT 1.9 (H) 09/20/2021 0812   BILITOT 0.5 04/20/2021 0833   PROT 7.5 09/20/2021 0812   PROT 7.0 04/20/2021 0833   ALBUMIN 3.7 09/20/2021 0812   ALBUMIN 4.9 (H) 04/20/2021 0833     Present during today's visit: patient and her daughter  Start plan: Diamond Hinton will get started today 09/20/21 or tomorrow 09/21/21   Patient Education I spoke with patient for overview of new oral chemotherapy medication: ribociclib   Administration: Counseled patient on administration, dosing, side effects, monitoring, drug-food interactions, safe handling, storage, and disposal. Patient will take 3 tablets (600 mg total) by mouth daily. Take for 21 days on, 7 days off, repeat every 28 days.  Side Effects: Side effects include but not limited to: nausea, fatigue, diarrhea, hair loss, decreased wbc/hgb.   Diarrhea: recommended patient keep loperamide one hand and to call if she is having 4 or more loose stools per day.  Nausea: patient has emetics to use as needed  Drug-drug Interactions (DDI): Oxycodone: ribociclib may increase the serum concentration of oxycodone. Recommend monitoring patients more frequently for increased opioid effects (eg, sedation, respiratory depression). No baseline dose adjustment needed.  Adherence: After discussion with patient no  patient barriers to medication adherence identified.  Reviewed with patient importance of keeping a medication schedule and plan for any missed doses.  Diamond Hinton voiced understanding and appreciation. All questions answered. Medication handout provided.  Provided patient with Oral [facility] phone number. Patient knows to call the office with questions or concerns. Oral Chemotherapy Navigation Clinic will continue to follow.  Patient expressed understanding and was in agreement with this plan. She also understands that She can call clinic at any time with any questions, concerns, or complaints.   Medication Access Issues: Patient starting with free voucher supply, application for manufacturer assistance pending  Follow-up plan: RTC in 2 weeks  Thank you for allowing me to participate in the care of this patient.   Time Total: 20  mins  Visit consisted of counseling and education on dealing with issues of symptom management in the setting of serious and potentially life-threatening illness.Greater than 50%  of this time was spent counseling and coordinating care related to the above assessment and plan.  Signed by: Darl Pikes, PharmD, BCPS, Salley Slaughter, CPP Hematology/Oncology Clinical Pharmacist Practitioner [place]/DB/AP Oral [facility] [phone]  09/20/2021 11:58 AM

## 2021-09-20 NOTE — Telephone Encounter (Signed)
Oral Oncology Patient Advocate Encounter ? ?With the patients permission, I submitted an online application to Patient Assistance Now Oncology [facility]).  PANO will complete a benefits investigation for Kisqali and review the application submitted to determine if the patient qualifies for [employer] Patient [place]. ? ?Medication: Kisqali ?Confirmation number: [id number] ? ?I faxed the completed Health Care Provider form to [phone]. ? ?PANO phone number is [phone]. ? ?Dennison Nancy CPHT ?Specialty Pharmacy Patient Advocate ?[place] ?Phone [phone] ?Fax [phone] ?09/20/2021 11:10 AM ? ? ?

## 2021-09-22 ENCOUNTER — Ambulatory Visit (INDEPENDENT_AMBULATORY_CARE_PROVIDER_SITE_OTHER): Payer: Medicare HMO | Admitting: Surgery

## 2021-09-22 ENCOUNTER — Encounter: Payer: Self-pay | Admitting: Surgery

## 2021-09-22 VITALS — BP 114/77 | HR 85 | Temp 98.4°F | Ht 63.0 in | Wt 128.8 lb

## 2021-09-22 DIAGNOSIS — Z09 Encounter for follow-up examination after completed treatment for conditions other than malignant neoplasm: Secondary | ICD-10-CM

## 2021-09-22 DIAGNOSIS — K311 Adult hypertrophic pyloric stenosis: Secondary | ICD-10-CM

## 2021-09-22 NOTE — Patient Instructions (Addendum)
You may go ahead and start with a regular diet. Add in foods slowly.  ? ?Follow up here in 6 months.  ? ?GENERAL POST-OPERATIVE ?PATIENT INSTRUCTIONS  ? ?WOUND CARE INSTRUCTIONS:  Keep a dry clean dressing on the wound if there is drainage. The initial bandage may be removed after 24 hours.  Once the wound has quit draining you may leave it open to air.  If clothing rubs against the wound or causes irritation and the wound is not draining you may cover it with a dry dressing during the daytime.  Try to keep the wound dry and avoid ointments on the wound unless directed to do so.  If the wound becomes bright red and painful or starts to drain infected material that is not clear, please contact your physician immediately.  If the wound is mildly pink and has a thick firm ridge underneath it, this is normal, and is referred to as a healing ridge.  This will resolve over the next 4-6 weeks. ? ?BATHING: ?You may shower if you have been informed of this by your surgeon. However, Please do not submerge in a tub, hot tub, or pool until incisions are completely sealed or have been told by your surgeon that you may do so. ? ?DIET:  You may eat any foods that you can tolerate.  It is a good idea to eat a high fiber diet and take in plenty of fluids to prevent constipation.  If you do become constipated you may want to take a mild laxative or take ducolax tablets on a daily basis until your bowel habits are regular.  Constipation can be very uncomfortable, along with straining, after recent surgery. ? ?ACTIVITY:  You are encouraged to cough and deep breath or use your incentive spirometer if you were given one, every 15-30 minutes when awake.  This will help prevent respiratory complications and low grade fevers post-operatively if you had a general anesthetic.  You may want to hug a pillow when coughing and sneezing to add additional support to the surgical area, if you had abdominal or chest surgery, which will decrease pain  during these times.  You are encouraged to walk and engage in light activity for the next two weeks.  You should not lift more than 20 pounds for 6 weeks total after surgery as it could put you at increased risk for complications.  Twenty pounds is roughly equivalent to a plastic bag of groceries. At that time- Listen to your body when lifting, if you have pain when lifting, stop and then try again in a few days. Soreness after doing exercises or activities of daily living is normal as you get back in to your normal routine. ? ?MEDICATIONS:  Try to take narcotic medications and anti-inflammatory medications, such as tylenol, ibuprofen, naprosyn, etc., with food.  This will minimize stomach upset from the medication.  Should you develop nausea and vomiting from the pain medication, or develop a rash, please discontinue the medication and contact your physician.  You should not drive, make important decisions, or operate machinery when taking narcotic pain medication. ? ?SUNBLOCK ?Use sun block to incision area over the next year if this area will be exposed to sun. This helps decrease scarring and will allow you avoid a permanent darkened area over your incision. ? ?QUESTIONS:  Please feel free to call our office if you have any questions, and we will be glad to assist you. [phone] ? ? ?

## 2021-09-22 NOTE — Progress Notes (Signed)
[facility] ?POST-OP OFFICE VISIT ? ?09/22/2021 ? ?HPI: ?Diamond Hinton is a 66 y.o. female  s/p gastrojejunostomy, bypassing metastatic duodenal/biliary obstruction.  She reports she is doing well eating soft foods.  She is avoiding certain things like whole-grain bread etc. perhaps due to her relative malnutrition preoperatively.  I suspect she can liberalize her diet quite readily.  She is anticipating initiating her oral chemotherapy.  She looks well and is obviously regaining some strength. ? ?Vital signs: ?BP 114/77   Pulse 85   Temp 98.4 ?F (36.9 ?C) (Oral)   Ht '5\' 3"'$  (1.6 m)   Wt 128 lb 12.8 oz (58.4 kg)   SpO2 99%   BMI 22.82 kg/m?   ? ?Physical Exam: ?Constitutional: She looks good, nontoxic.  No evidence of persistent icterus. ?Abdomen: Soft benign nontender.  Transhepatic stent/drain in place still with drainage bag. ?Skin: Incisions are intact. ? ?Assessment/Plan: ?This is a 66 y.o. female s/p robotic gastrojejunosotmy, may liberalize diet.   ? ?Patient Active Problem List  ? Diagnosis Date Noted  ? Goals of care, counseling/discussion   ? Palliative care encounter   ? Hypophosphatemia 09/05/2021  ? Constipation 09/04/2021  ? Stenosis of duodenum   ? Jaundice 08/29/2021  ? Right upper quadrant abdominal pain 08/29/2021  ? Rash 08/29/2021  ? Obstructive jaundice 08/29/2021  ? UTI (urinary tract infection) 08/29/2021  ? Primary malignant neoplasm of breast with metastasis ([place]) 08/29/2021  ? History of cholecystectomy 08/29/2021  ? Gastric outlet obstruction 08/29/2021  ? Hypokalemia 08/29/2021  ? Uncontrolled type 2 diabetes mellitus with hyperglycemia, without long-term current use of insulin ([place]) 08/29/2021  ? Elevated lipase 08/29/2021  ? Reflux gastritis 08/04/2021  ? Diabetes mellitus without complication ([place]) 61/44/3154  ? Hypertension 04/18/2021  ? Overweight 01/15/2019  ? Hyperlipidemia associated with type 2 diabetes mellitus ([place]) 02/19/2015  ? Abnormal LFTs 02/19/2015   ? Adult hypothyroidism 02/19/2015  ? ? - Follow up in 6 mos or as needed.  ? ? ?Ronny Bacon M.D., FACS ?09/22/2021, 3:49 PM  ?

## 2021-09-23 NOTE — Telephone Encounter (Signed)
Oral Oncology Patient Advocate Encounter ? ?Received notification from [place] that patient has been successfully enrolled into their program to receive Kisqali from the manufacturer at $0 out of pocket until 06/11/22.  ?  ?I called and spoke with patient.  She knows we will have to re-apply.  ? ?Specialty Pharmacy that will dispense medication is [facility]. ? ?Patient knows to call the office with questions or concerns. ?  ?Oral Oncology Clinic will continue to follow. ? ?Dennison Nancy CPHT ?Specialty Pharmacy Patient Advocate ?[place] ?Phone [phone] ?Fax [phone] ?09/23/2021 1:42 PM ? ?

## 2021-09-26 ENCOUNTER — Telehealth: Payer: Self-pay | Admitting: Family Medicine

## 2021-09-26 NOTE — Telephone Encounter (Signed)
Patient is requesting new Rx- 0.9% sodium chloride injection flush #15 ?[facility] ?

## 2021-09-26 NOTE — Telephone Encounter (Signed)
Pt spoke with her insurance and was advised that Dr. B needs to call in an RX to [facility] on [address] for 15 saline flushes 0.9% sodium chloride injection flush / please advise / only 15 will be covered a month/ pt has a few from the hospital but needs RX  ?

## 2021-09-27 ENCOUNTER — Telehealth: Payer: Self-pay | Admitting: *Deleted

## 2021-09-27 NOTE — Telephone Encounter (Signed)
Please have her reach out to her surgeon to Rx.  We do not typically manage this. ?

## 2021-09-27 NOTE — Telephone Encounter (Signed)
Hassan Rowan triage nurse sent me a message that daughter called and she wanted to see when FMLA papers will be sent in. I told Hassan Rowan to tell her that I will do it at lunch. I have them done and I needed Janese Banks to sign. ?I got signature and faxed the forms to daughter as well the forms to [employer] ?

## 2021-09-28 ENCOUNTER — Other Ambulatory Visit: Payer: Self-pay

## 2021-09-28 ENCOUNTER — Telehealth: Payer: Self-pay | Admitting: *Deleted

## 2021-09-28 MED ORDER — SODIUM CHLORIDE 0.9 % IJ SOLN
3.0000 mL | INTRAMUSCULAR | 2 refills | Status: DC | PRN
Start: 2021-09-28 — End: 2021-10-10

## 2021-09-28 NOTE — Telephone Encounter (Signed)
Patient advised.

## 2021-09-28 NOTE — Telephone Encounter (Signed)
Patient called and stated that she would like to see if she can get a prescription for her flushes 0.9%, she uses [facility] [address]. Please call and advise  ?

## 2021-09-30 ENCOUNTER — Telehealth: Payer: Self-pay | Admitting: Family Medicine

## 2021-09-30 NOTE — Telephone Encounter (Signed)
Spoke with patient about scheduling her [employer] Visit (AWV).She asked to put off due to already scheduled doctor appointments I told her I would call back in June 2023 ? ?If not able to come in the office, please offer to do virtually or by telephone.  ? ?No hx of AWV - AWV-I eligible per [employer] as of  12/11/2019  ? ?Please schedule at anytime with [facility] Health Advisor.  ? ?45 minute appointment ? ?Any questions, please contact me at [phone]  ?

## 2021-10-04 ENCOUNTER — Inpatient Hospital Stay: Payer: Medicare HMO

## 2021-10-04 ENCOUNTER — Inpatient Hospital Stay: Payer: Medicare HMO | Admitting: Pharmacist

## 2021-10-04 ENCOUNTER — Inpatient Hospital Stay (HOSPITAL_BASED_OUTPATIENT_CLINIC_OR_DEPARTMENT_OTHER): Payer: Medicare HMO | Admitting: Nurse Practitioner

## 2021-10-04 ENCOUNTER — Encounter: Payer: Self-pay | Admitting: Nurse Practitioner

## 2021-10-04 ENCOUNTER — Other Ambulatory Visit: Payer: Self-pay | Admitting: *Deleted

## 2021-10-04 VITALS — BP 113/67 | HR 68 | Temp 98.4°F | Resp 16

## 2021-10-04 DIAGNOSIS — E876 Hypokalemia: Secondary | ICD-10-CM

## 2021-10-04 DIAGNOSIS — Z01810 Encounter for preprocedural cardiovascular examination: Secondary | ICD-10-CM | POA: Diagnosis not present

## 2021-10-04 DIAGNOSIS — N179 Acute kidney failure, unspecified: Secondary | ICD-10-CM

## 2021-10-04 DIAGNOSIS — E1165 Type 2 diabetes mellitus with hyperglycemia: Secondary | ICD-10-CM | POA: Diagnosis not present

## 2021-10-04 DIAGNOSIS — D582 Other hemoglobinopathies: Secondary | ICD-10-CM | POA: Diagnosis not present

## 2021-10-04 DIAGNOSIS — E871 Hypo-osmolality and hyponatremia: Secondary | ICD-10-CM

## 2021-10-04 DIAGNOSIS — C50912 Malignant neoplasm of unspecified site of left female breast: Secondary | ICD-10-CM

## 2021-10-04 DIAGNOSIS — Z7984 Long term (current) use of oral hypoglycemic drugs: Secondary | ICD-10-CM | POA: Diagnosis not present

## 2021-10-04 DIAGNOSIS — Z170 Estrogen receptor positive status [ER+]: Secondary | ICD-10-CM | POA: Diagnosis not present

## 2021-10-04 DIAGNOSIS — N6320 Unspecified lump in the left breast, unspecified quadrant: Secondary | ICD-10-CM

## 2021-10-04 DIAGNOSIS — I10 Essential (primary) hypertension: Secondary | ICD-10-CM | POA: Diagnosis not present

## 2021-10-04 DIAGNOSIS — Z79899 Other long term (current) drug therapy: Secondary | ICD-10-CM | POA: Diagnosis not present

## 2021-10-04 DIAGNOSIS — K831 Obstruction of bile duct: Secondary | ICD-10-CM | POA: Diagnosis not present

## 2021-10-04 DIAGNOSIS — C784 Secondary malignant neoplasm of small intestine: Secondary | ICD-10-CM | POA: Diagnosis not present

## 2021-10-04 LAB — CBC WITH DIFFERENTIAL/PLATELET
Abs Immature Granulocytes: 0.04 10*3/uL (ref 0.00–0.07)
Basophils Absolute: 0.1 10*3/uL (ref 0.0–0.1)
Basophils Relative: 1 %
Eosinophils Absolute: 0.1 10*3/uL (ref 0.0–0.5)
Eosinophils Relative: 1 %
HCT: 42.5 % (ref 36.0–46.0)
Hemoglobin: 15.3 g/dL — ABNORMAL HIGH (ref 12.0–15.0)
Immature Granulocytes: 1 %
Lymphocytes Relative: 23 %
Lymphs Abs: 1.8 10*3/uL (ref 0.7–4.0)
MCH: 30.7 pg (ref 26.0–34.0)
MCHC: 36 g/dL (ref 30.0–36.0)
MCV: 85.2 fL (ref 80.0–100.0)
Monocytes Absolute: 0.5 10*3/uL (ref 0.1–1.0)
Monocytes Relative: 6 %
Neutro Abs: 5.4 10*3/uL (ref 1.7–7.7)
Neutrophils Relative %: 68 %
Platelets: 341 10*3/uL (ref 150–400)
RBC: 4.99 MIL/uL (ref 3.87–5.11)
RDW: 15.3 % (ref 11.5–15.5)
WBC: 7.8 10*3/uL (ref 4.0–10.5)
nRBC: 0 % (ref 0.0–0.2)

## 2021-10-04 LAB — COMPREHENSIVE METABOLIC PANEL
ALT: 50 U/L — ABNORMAL HIGH (ref 0–44)
AST: 37 U/L (ref 15–41)
Albumin: 4.3 g/dL (ref 3.5–5.0)
Alkaline Phosphatase: 81 U/L (ref 38–126)
Anion gap: 15 (ref 5–15)
BUN: 87 mg/dL — ABNORMAL HIGH (ref 8–23)
CO2: 23 mmol/L (ref 22–32)
Calcium: 9.6 mg/dL (ref 8.9–10.3)
Chloride: 92 mmol/L — ABNORMAL LOW (ref 98–111)
Creatinine, Ser: 3.06 mg/dL — ABNORMAL HIGH (ref 0.44–1.00)
GFR, Estimated: 16 mL/min — ABNORMAL LOW (ref >60)
Glucose, Bld: 200 mg/dL — ABNORMAL HIGH (ref 70–99)
Potassium: 2.9 mmol/L — ABNORMAL LOW (ref 3.5–5.1)
Sodium: 130 mmol/L — ABNORMAL LOW (ref 135–145)
Total Bilirubin: 1 mg/dL (ref 0.3–1.2)
Total Protein: 8.1 g/dL (ref 6.5–8.1)

## 2021-10-04 LAB — MAGNESIUM: Magnesium: 2.3 mg/dL (ref 1.7–2.4)

## 2021-10-04 MED ORDER — POTASSIUM CHLORIDE CRYS ER 10 MEQ PO TBCR: 20.0000 meq | EXTENDED_RELEASE_TABLET | Freq: Every day | ORAL | 0 refills | Status: DC

## 2021-10-04 MED ORDER — POTASSIUM CHLORIDE IN NACL 20-0.9 MEQ/L-% IV SOLN
INTRAVENOUS | Status: DC
  Filled 2021-10-04 (×2): qty 1000

## 2021-10-04 NOTE — Progress Notes (Signed)
? ?Symptom Management Clinic ? ?[facility] ?A Department of the [facility] ?[address], [address] ?[place], [place] [address] ?[phone] (phone) ?[phone] (fax) ? ?Patient Care Team: ?Bacigalupo, Angela M, MD as PCP - General (Family Medicine) ?Shaver, Anne F, RN as Oncology Nurse Navigator  ? ?Name of the patient: Diamond Hinton  ?[id number]  ?06/27/1955  ? ?Date of visit: 10/04/21 ? ?Diagnosis- Metastatic breast cancer ? ?Chief complaint/ Reason for visit- Kidney Failure  ? ?Heme/Onc history:  ?Oncology History  ?Primary malignant neoplasm of breast with metastasis (HCC)  ?08/29/2021 Initial Diagnosis  ? Metastatic breast cancer (HCC) ? ?  ?09/08/2021 -  Chemotherapy  ? Patient is on Treatment Plan : BREAST Abemaciclib + Fulvestrant q28d  ? ?  ?  ?09/20/2021 Cancer Staging  ? Staging form: Breast, AJCC 8th Edition ?- Pathologic stage from 09/20/2021: Stage IV (pT1, pNX, pM1, G3, ER+, PR+, HER2-) - Signed by Rao, Archana C, MD on 09/20/2021 ?Multigene prognostic tests performed: None ?Histologic grading system: 3 grade system ? ?  ? ? ?Interval history-patient is 65-year-old female with above history of metastatic breast cancer currently receiving ribociclib and letrozole who presents to symptom adjuvant clinic for acute kidney failure.  She was scheduled for routine follow-up after starting treatment with pharmacy.  Labs were drawn and she was found to have increased serum creatinine.  In speaking with patient, she has had poor oral intake over the past several days.  Felt dehydrated and has been trying to increase fluid intake.  No fever, illness, diarrhea, vomiting.  Took her hydrochlorothiazide as directed.  Notices that she has been urinating more than normal. She has chronic nausea which impairs her oral intake. Endorses taste and texture changes.  ? ?ECOG FS:2 - Symptomatic, <50% confined to bed ? ?Review of systems- Review of Systems   ?Constitutional:  Positive for malaise/fatigue and weight loss. Negative for chills and fever.  ?HENT:  Negative for hearing loss, nosebleeds, sore throat and tinnitus.   ?Eyes:  Negative for blurred vision and double vision.  ?Respiratory:  Negative for cough, hemoptysis, shortness of breath and wheezing.   ?Cardiovascular:  Negative for chest pain, palpitations and leg swelling.  ?Gastrointestinal:  Positive for nausea. Negative for abdominal pain, blood in stool, constipation, diarrhea, melena and vomiting.  ?Genitourinary:  Positive for frequency. Negative for dysuria, flank pain, hematuria and urgency.  ?Musculoskeletal:  Negative for back pain, falls, joint pain and myalgias.  ?Skin:  Negative for itching and rash.  ?Neurological:  Negative for dizziness, tingling, sensory change, loss of consciousness, weakness and headaches.  ?Endo/Heme/Allergies:  Negative for environmental allergies. Does not bruise/bleed easily.  ?Psychiatric/Behavioral:  Negative for depression. The patient is not nervous/anxious and does not have insomnia.    ? ?Current treatment- kisquali & letrozole ? ?Allergies  ?Allergen Reactions  ? Peanuts [Peanut Oil] Shortness Of Breath and Itching  ? ? ?Past Medical History:  ?Diagnosis Date  ? Anemia   ? Anxiety   ? Breast cancer (HCC)   ? Gallstones   ? GERD (gastroesophageal reflux disease)   ? Hyperlipidemia   ? Hypertension   ? ? ?Past Surgical History:  ?Procedure Laterality Date  ? BILATERAL SALPINGECTOMY  04/03/1997  ? CHOLECYSTECTOMY N/A 07/07/2015  ? Procedure: LAPAROSCOPIC CHOLECYSTECTOMY ;  Surgeon: Diego F Pabon, MD;  Location: [facility] ORS;  Service: General;  Laterality: N/A;  ? ESOPHAGOGASTRODUODENOSCOPY  08/30/2021  ? Procedure: ESOPHAGOGASTRODUODENOSCOPY (EGD);  Surgeon: Wohl, Darren,   MD;  Location: [facility];  Service: Endoscopy;;  ? GALLBLADDER SURGERY  07/07/2015  ? [place]  ? IR BILIARY DRAIN PLACEMENT WITH CHOLANGIOGRAM  08/31/2021  ? IR CONVERT BILIARY DRAIN TO INT EXT  BILIARY DRAIN  09/02/2021  ? ? ?Social History  ? ?Socioeconomic History  ? Marital status: Married  ?  Spouse name: Not on file  ? Number of children: Not on file  ? Years of education: Not on file  ? Highest education level: Not on file  ?Occupational History  ? Not on file  ?Tobacco Use  ? Smoking status: Never  ? Smokeless tobacco: Never  ?Vaping Use  ? Vaping Use: Never used  ?Substance and Sexual Activity  ? Alcohol use: No  ? Drug use: No  ? Sexual activity: Yes  ?Other Topics Concern  ? Not on file  ?Social History Narrative  ? Not on file  ? ?Social Determinants of Health  ? ?Financial Resource Strain: Not on file  ?Food Insecurity: Not on file  ?Transportation Needs: Not on file  ?Physical Activity: Not on file  ?Stress: Not on file  ?Social Connections: Not on file  ?Intimate Partner Violence: Not on file  ? ? ?Family History  ?Problem Relation Age of Onset  ? Hypertension Mother   ? CVA Mother   ? Ulcers Mother   ? Emphysema Mother   ? Heart disease Father   ? Prostate cancer Father   ? Macular degeneration Father   ? Hypertension Sister   ? Diabetes Sister   ? Cervical polyp Sister   ? Hypertension Sister   ? Diabetes Sister   ? Lymphoma Sister   ? ? ? ?Current Outpatient Medications:  ?  acetaminophen (TYLENOL) 325 MG tablet, Take 325 mg by mouth every 6 (six) hours as needed (for pain and sometimes takes 2 if pain is worse)., Disp: , Rfl:  ?  feeding supplement (ENSURE ENLIVE / ENSURE PLUS) LIQD, Take 237 mLs by mouth 3 (three) times daily between meals., Disp: 237 mL, Rfl: 12 ?  hydrochlorothiazide (HYDRODIURIL) 25 MG tablet, Take 1 tablet (25 mg total) by mouth daily., Disp: 90 tablet, Rfl: 3 ?  letrozole (FEMARA) 2.5 MG tablet, Take 1 tablet (2.5 mg total) by mouth daily., Disp: 30 tablet, Rfl: 3 ?  levothyroxine (SYNTHROID) 75 MCG tablet, Take 1 tablet (75 mcg total) by mouth daily., Disp: 90 tablet, Rfl: 3 ?  loratadine (CLARITIN) 10 MG tablet, Take 10 mg by mouth daily as needed for allergies.  (Patient not taking: Reported on 10/04/2021), Disp: , Rfl:  ?  metFORMIN (GLUCOPHAGE) 500 MG tablet, Take by mouth 2 (two) times daily with a meal., Disp: , Rfl:  ?  Multiple Vitamin (MULTIVITAMIN WITH MINERALS) TABS tablet, Take 1 tablet by mouth daily. (Patient taking differently: Take 1 tablet by mouth daily. 5 alive), Disp: , Rfl:  ?  ondansetron (ZOFRAN) 4 MG tablet, Take 1 tablet (4 mg total) by mouth daily as needed for nausea or vomiting., Disp: 30 tablet, Rfl: 1 ?  oxyCODONE-acetaminophen (PERCOCET) 5-325 MG tablet, Take 1 tablet by mouth every 4 (four) hours as needed for severe pain. (Patient not taking: Reported on 10/04/2021), Disp: 20 tablet, Rfl: 0 ?  pantoprazole (PROTONIX) 40 MG tablet, Take 1 tablet (40 mg total) by mouth daily., Disp: 30 tablet, Rfl: 11 ?  polyethylene glycol (MIRALAX / GLYCOLAX) 17 g packet, Take 17 g by mouth daily as needed. (Patient not taking: Reported on 10/04/2021), Disp: , Rfl:  ?  prochlorperazine (COMPAZINE) 5 MG tablet, Take 1 tablet (5 mg total) by mouth every 6 (six) hours as needed for refractory nausea / vomiting., Disp: 30 tablet, Rfl: 1 ?  promethazine (PHENERGAN) 12.5 MG tablet, Take 1 tablet (12.5 mg total) by mouth every 6 (six) hours as needed for nausea or vomiting. (Patient not taking: Reported on 10/04/2021), Disp: 30 tablet, Rfl: 2 ?  ribociclib succ (KISQALI, 600 MG DOSE,) 200 MG Therapy Pack, Take 3 tablets (600 mg total) by mouth daily. Take for 21 days on, 7 days off, repeat every 28 days., Disp: 63 tablet, Rfl: 0 ?  scopolamine (TRANSDERM-SCOP) 1 MG/3DAYS, Place 1 patch (1.5 mg total) onto the skin every 3 (three) days. (Patient not taking: Reported on 10/04/2021), Disp: 10 patch, Rfl: 12 ?  sodium chloride 0.9 % injection, Inject 3 mLs into the vein as needed., Disp: 5 mL, Rfl: 2 ?No current facility-administered medications for this visit. ? ?Facility-Administered Medications Ordered in Other Visits:  ?  0.9 % NaCl with KCl 20 mEq/ L  infusion, ,  Intravenous, Continuous, Sindy Guadeloupe, MD ? ?Physical exam: There were no vitals filed for this visit. ?Physical Exam ?Vitals reviewed.  ?Constitutional:   ?   General: She is not in acute distress. ?   Appearance: She is we

## 2021-10-04 NOTE — Progress Notes (Signed)
Pt does as best she cna she says. Keeps up with water that she intakes, she tries to eat food like rice, egss, toast , cracker. She did have chicken and applesauce.  She does do carnation and premiere, ensure but only gets 1 in a day. If the taste and texture is not right she can't eat it. She also afraid to get sick so I rec: that she take nausea  med every morning and every 8 hours if needed ?

## 2021-10-04 NOTE — Progress Notes (Signed)
Nutrition Follow-up: ? 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  S/p robotic assisted laparoscopic gastrojejunostomy on 3/30.   ? ?Met with patient today.  Seen in [facility] due to acute kidney injury.  Patient reports decreased oral intake of solid foods and fluids.  Has been trying to increase oral intake.  Reports morning she can generally eat fairly good but then as the day goes on appetite decreases.  Smells, some textures make her nauseated.  Says that she takes zofran 1 hour prior to chemo pill typically but may not take anymore nausea pills later in the day.   ? ? ? ?Medications: stopping metformin, HCTZ, kisquali due to AKI ? ?Labs: Na 130, K 2.9, glucose 200, BUN 87, creatinine 3.06 ? ?Anthropometrics:  ? ?Weight 128 lb 12.8 oz on 4/13 ? ?129 lb 6.4 oz on 4/11 ?142 lb on 04/18/21 ? ? ?NUTRITION DIAGNOSIS: Inadequate oral intake continues ? ? ?INTERVENTION:  ?Discussed importance of being proactive with taking nausea medications to allow her to eat and drink more.   ?Continue shakes as able ?Discussed pedialyte, low sugar gatorade/powerade, ensure rapid hydration.  Samples of ensure rapid hydration and pedialyte given to patient.  ? ?  ? ?MONITORING, EVALUATION, GOAL: weight trends, intake ? ? ?NEXT VISIT: phone call Thursday, May 11  ? ?Ravon Mortellaro B. Zenia Resides, RD, LDN ?Registered Dietitian ?[phone] ? ? ?

## 2021-10-04 NOTE — Progress Notes (Signed)
? ?Oral Chemotherapy Clinic ?[place]  ?[phone] [phone] ? ?Patient Care Team: ?Virginia Crews, MD as PCP - General (Family Medicine) ?Theodore Demark, RN as Oncology Nurse Navigator  ? ?Name of the patient: Diamond Hinton  ?[id number]  ?1956/05/03  ? ?Date of visit: 10/04/21 ? ?HPI: Patient is a 66 y.o. female with newly diagnosed metastatic breast cancer, ER positive/HER2 and PR negative. Currently treated with ribociclib and letrozole ? ?Reason for Consult: Oral chemotherapy follow-up for ribociclib therapy. ? ? ?PAST MEDICAL HISTORY: ?Past Medical History:  ?Diagnosis Date  ? Anemia   ? Anxiety   ? Breast cancer ([place])   ? Gallstones   ? GERD (gastroesophageal reflux disease)   ? Hyperlipidemia   ? Hypertension   ? Jaundice 08/29/2021  ? Rash 08/29/2021  ? Right upper quadrant abdominal pain 08/29/2021  ? UTI (urinary tract infection) 08/29/2021  ? ? ?HEMATOLOGY/ONCOLOGY HISTORY:  ?Oncology History  ?Primary malignant neoplasm of breast with metastasis ([place])  ?08/29/2021 Initial Diagnosis  ? Metastatic breast cancer ([place]) ? ?  ?09/08/2021 -  Chemotherapy  ? Patient is on Treatment Plan : BREAST Abemaciclib + Fulvestrant q28d  ? ?  ?  ?09/20/2021 Cancer Staging  ? Staging form: Breast, AJCC 8th Edition ?- Pathologic stage from 09/20/2021: Stage IV (pT1, pNX, pM1, G3, ER+, PR+, HER2-) - Signed by Sindy Guadeloupe, MD on 09/20/2021 ?Multigene prognostic tests performed: None ?Histologic grading system: 3 grade system ? ?  ? ? ?ALLERGIES:  is allergic to peanuts [peanut oil]. ? ?MEDICATIONS:  ?Current Outpatient Medications  ?Medication Sig Dispense Refill  ? acetaminophen (TYLENOL) 325 MG tablet Take 325 mg by mouth every 6 (six) hours as needed (for pain and sometimes takes 2 if pain is worse).    ? feeding supplement (ENSURE ENLIVE / ENSURE PLUS) LIQD Take 237 mLs by mouth 3 (three) times daily between meals. 237 mL 12  ? letrozole (FEMARA) 2.5 MG tablet Take 1 tablet (2.5 mg  total) by mouth daily. 30 tablet 3  ? levothyroxine (SYNTHROID) 75 MCG tablet Take 1 tablet (75 mcg total) by mouth daily. 90 tablet 3  ? Multiple Vitamin (MULTIVITAMIN WITH MINERALS) TABS tablet Take 1 tablet by mouth daily. (Patient taking differently: Take 1 tablet by mouth daily. 5 alive)    ? ondansetron (ZOFRAN) 4 MG tablet Take 1 tablet (4 mg total) by mouth daily as needed for nausea or vomiting. 30 tablet 1  ? pantoprazole (PROTONIX) 40 MG tablet Take 1 tablet (40 mg total) by mouth daily. 30 tablet 11  ? prochlorperazine (COMPAZINE) 5 MG tablet Take 1 tablet (5 mg total) by mouth every 6 (six) hours as needed for refractory nausea / vomiting. 30 tablet 1  ? ribociclib succ (KISQALI, 600 MG DOSE,) 200 MG Therapy Pack Take 3 tablets (600 mg total) by mouth daily. Take for 21 days on, 7 days off, repeat every 28 days. 63 tablet 0  ? sodium chloride 0.9 % injection Inject 3 mLs into the vein as needed. 5 mL 2  ? loratadine (CLARITIN) 10 MG tablet Take 10 mg by mouth daily as needed for allergies. (Patient not taking: Reported on 10/04/2021)    ? oxyCODONE-acetaminophen (PERCOCET) 5-325 MG tablet Take 1 tablet by mouth every 4 (four) hours as needed for severe pain. (Patient not taking: Reported on 10/04/2021) 20 tablet 0  ? polyethylene glycol (MIRALAX / GLYCOLAX) 17 g packet Take 17 g by mouth daily as needed. (Patient not taking:  Reported on 10/04/2021)    ? potassium chloride SA (KLOR-CON M) 10 MEQ tablet Take 2 tablets (20 mEq total) by mouth daily. 14 tablet 0  ? promethazine (PHENERGAN) 12.5 MG tablet Take 1 tablet (12.5 mg total) by mouth every 6 (six) hours as needed for nausea or vomiting. (Patient not taking: Reported on 10/04/2021) 30 tablet 2  ? scopolamine (TRANSDERM-SCOP) 1 MG/3DAYS Place 1 patch (1.5 mg total) onto the skin every 3 (three) days. (Patient not taking: Reported on 10/04/2021) 10 patch 12  ? ?No current facility-administered medications for this visit.  ? ?Facility-Administered  Medications Ordered in Other Visits  ?Medication Dose Route Frequency Provider Last Rate Last Admin  ? 0.9 % NaCl with KCl 20 mEq/ L  infusion   Intravenous Continuous Sindy Guadeloupe, MD   Stopped at 10/04/21 1341  ? ? ?VITAL SIGNS: ?There were no vitals taken for this visit. ?There were no vitals filed for this visit.  ?Estimated body mass index is 22.82 kg/m? as calculated from the following: ?  Height as of 09/22/21: 5' 3"  (1.6 m). ?  Weight as of 09/22/21: 58.4 kg (128 lb 12.8 oz). ? ?LABS: ?CBC: ?   ?Component Value Date/Time  ? WBC 7.8 10/04/2021 0948  ? HGB 15.3 (H) 10/04/2021 0948  ? HGB 15.4 12/01/2015 0829  ? HCT 42.5 10/04/2021 0948  ? HCT 43.6 12/01/2015 0829  ? PLT 341 10/04/2021 0948  ? PLT 258 12/01/2015 0829  ? MCV 85.2 10/04/2021 0948  ? MCV 85 12/01/2015 0829  ? NEUTROABS 5.4 10/04/2021 0948  ? NEUTROABS 5.0 12/01/2015 0829  ? LYMPHSABS 1.8 10/04/2021 0948  ? LYMPHSABS 2.4 12/01/2015 0829  ? MONOABS 0.5 10/04/2021 0948  ? EOSABS 0.1 10/04/2021 0948  ? EOSABS 0.1 12/01/2015 0829  ? BASOSABS 0.1 10/04/2021 0948  ? BASOSABS 0.0 12/01/2015 0829  ? ?Comprehensive Metabolic Panel: ?   ?Component Value Date/Time  ? NA 130 (L) 10/04/2021 0948  ? NA 140 04/20/2021 0833  ? K 2.9 (L) 10/04/2021 0948  ? CL 92 (L) 10/04/2021 0948  ? CO2 23 10/04/2021 0948  ? BUN 87 (H) 10/04/2021 0948  ? BUN 13 04/20/2021 0833  ? CREATININE 3.06 (H) 10/04/2021 0948  ? GLUCOSE 200 (H) 10/04/2021 0948  ? CALCIUM 9.6 10/04/2021 0948  ? AST 37 10/04/2021 0948  ? ALT 50 (H) 10/04/2021 0948  ? ALKPHOS 81 10/04/2021 0948  ? BILITOT 1.0 10/04/2021 0948  ? BILITOT 0.5 04/20/2021 0833  ? PROT 8.1 10/04/2021 0948  ? PROT 7.0 04/20/2021 0833  ? ALBUMIN 4.3 10/04/2021 0948  ? ALBUMIN 4.9 (H) 04/20/2021 5053  ? ? ? ?Present during today's visit: patient and her daughter ? ?Assessment and Plan: ?CMP and CBC reviewed with patient and her daughter, AKI-multifactorial ?SCr elevation: Patient has had an acute increase in her SCr. She endorses poor  oral intake lately and darkening of her urine. Patient was sent to symptom management for evaluation and hydration. Patient was offered admission for hydration and monitoring but she declined at this time. She knows she will require repeat visit to clinic this week for labs and hydration ?Hyperglycemia: patient reports not taking her metformin yesterday and today. Elevated blood sugars could have worsened patient's hydration status. Due to her current renal impairment, she will need to hold her metformin until improvement in eGFR ?Potassium: potassium low, home supplementation x7 days ordered by Ander Purpura, NP along with KCl in fluids today ?Of note, patient has been taking her hydrochlorothiazide, she  was instructed to discontinue that as this time  ?Due to current renal impairment patient was instructed to hold her ribociclib, will monitor renal function for the ability to restart therapy ?Ribociclib could have been a factor in SCr elevation, will monitor when treatment is re-initiated ?ECG checked today: QTc with in normal limits ? ?Oral Chemotherapy Adherence: no reported missed doses ?Ms. Aguiniga patient barriers to medication adherence identified.  ? ?New medications: None reported ? ?Medication Access Issues: no issues ? ?Patient expressed understanding and was in agreement with this plan. She also understands that She can call clinic at any time with any questions, concerns, or complaints.  ? ?Follow-up plan: RTC tomorrow 10/05/21 ? ?Thank you for allowing me to participate in the care of this very pleasant patient.  ? ?Time Total: 20 mins ? ?Visit consisted of counseling and education on dealing with issues of symptom management in the setting of serious and potentially life-threatening illness.Greater than 50%  of this time was spent counseling and coordinating care related to the above assessment and plan. ? ?Signed by: ?Darl Pikes, PharmD, BCPS, BCOP, CPP ?Hematology/Oncology Clinical Pharmacist  Practitioner ?[place]/[facility] Oral Chemotherapy Navigation [facility] ?[phone] ? ?10/04/2021 2:47 PM ? ?

## 2021-10-04 NOTE — Progress Notes (Signed)
Pt received IV potassium over 2 hours via peripheral site in left forearm in 1 liter of NS. Tolerated well. Discharged to home. VSS. Ambulatory. ?

## 2021-10-05 ENCOUNTER — Inpatient Hospital Stay (HOSPITAL_BASED_OUTPATIENT_CLINIC_OR_DEPARTMENT_OTHER): Payer: Medicare HMO | Admitting: Medical Oncology

## 2021-10-05 ENCOUNTER — Inpatient Hospital Stay: Payer: Medicare HMO | Attending: Nurse Practitioner

## 2021-10-05 ENCOUNTER — Encounter: Payer: Self-pay | Admitting: Oncology

## 2021-10-05 ENCOUNTER — Inpatient Hospital Stay: Payer: Medicare HMO

## 2021-10-05 VITALS — BP 121/78 | HR 84 | Temp 99.1°F | Resp 16 | Wt 125.0 lb

## 2021-10-05 VITALS — BP 123/73 | HR 73 | Resp 16

## 2021-10-05 DIAGNOSIS — E46 Unspecified protein-calorie malnutrition: Secondary | ICD-10-CM

## 2021-10-05 DIAGNOSIS — E876 Hypokalemia: Secondary | ICD-10-CM

## 2021-10-05 DIAGNOSIS — N179 Acute kidney failure, unspecified: Secondary | ICD-10-CM | POA: Diagnosis not present

## 2021-10-05 DIAGNOSIS — E871 Hypo-osmolality and hyponatremia: Secondary | ICD-10-CM

## 2021-10-05 DIAGNOSIS — D582 Other hemoglobinopathies: Secondary | ICD-10-CM

## 2021-10-05 DIAGNOSIS — E1165 Type 2 diabetes mellitus with hyperglycemia: Secondary | ICD-10-CM | POA: Diagnosis not present

## 2021-10-05 DIAGNOSIS — C784 Secondary malignant neoplasm of small intestine: Secondary | ICD-10-CM | POA: Diagnosis not present

## 2021-10-05 DIAGNOSIS — N6320 Unspecified lump in the left breast, unspecified quadrant: Secondary | ICD-10-CM

## 2021-10-05 DIAGNOSIS — I10 Essential (primary) hypertension: Secondary | ICD-10-CM

## 2021-10-05 DIAGNOSIS — C50912 Malignant neoplasm of unspecified site of left female breast: Secondary | ICD-10-CM | POA: Diagnosis not present

## 2021-10-05 LAB — COMPREHENSIVE METABOLIC PANEL
ALT: 47 U/L — ABNORMAL HIGH (ref 0–44)
AST: 38 U/L (ref 15–41)
Albumin: 4.2 g/dL (ref 3.5–5.0)
Alkaline Phosphatase: 77 U/L (ref 38–126)
Anion gap: 11 (ref 5–15)
BUN: 59 mg/dL — ABNORMAL HIGH (ref 8–23)
CO2: 24 mmol/L (ref 22–32)
Calcium: 9 mg/dL (ref 8.9–10.3)
Chloride: 95 mmol/L — ABNORMAL LOW (ref 98–111)
Creatinine, Ser: 2.09 mg/dL — ABNORMAL HIGH (ref 0.44–1.00)
GFR, Estimated: 25 mL/min — ABNORMAL LOW (ref >60)
Glucose, Bld: 177 mg/dL — ABNORMAL HIGH (ref 70–99)
Potassium: 2.5 mmol/L — CL (ref 3.5–5.1)
Sodium: 130 mmol/L — ABNORMAL LOW (ref 135–145)
Total Bilirubin: 1 mg/dL (ref 0.3–1.2)
Total Protein: 7.8 g/dL (ref 6.5–8.1)

## 2021-10-05 LAB — CBC WITH DIFFERENTIAL/PLATELET
Abs Immature Granulocytes: 0.02 10*3/uL (ref 0.00–0.07)
Basophils Absolute: 0.1 10*3/uL (ref 0.0–0.1)
Basophils Relative: 1 %
Eosinophils Absolute: 0.1 10*3/uL (ref 0.0–0.5)
Eosinophils Relative: 1 %
HCT: 40.3 % (ref 36.0–46.0)
Hemoglobin: 14.4 g/dL (ref 12.0–15.0)
Immature Granulocytes: 0 %
Lymphocytes Relative: 23 %
Lymphs Abs: 1.6 10*3/uL (ref 0.7–4.0)
MCH: 30.8 pg (ref 26.0–34.0)
MCHC: 35.7 g/dL (ref 30.0–36.0)
MCV: 86.1 fL (ref 80.0–100.0)
Monocytes Absolute: 0.3 10*3/uL (ref 0.1–1.0)
Monocytes Relative: 5 %
Neutro Abs: 4.8 10*3/uL (ref 1.7–7.7)
Neutrophils Relative %: 70 %
Platelets: 331 10*3/uL (ref 150–400)
RBC: 4.68 MIL/uL (ref 3.87–5.11)
RDW: 15.5 % (ref 11.5–15.5)
WBC: 6.9 10*3/uL (ref 4.0–10.5)
nRBC: 0 % (ref 0.0–0.2)

## 2021-10-05 LAB — MAGNESIUM: Magnesium: 2.1 mg/dL (ref 1.7–2.4)

## 2021-10-05 MED ORDER — POTASSIUM CHLORIDE CRYS ER 10 MEQ PO TBCR: 20.0000 meq | EXTENDED_RELEASE_TABLET | Freq: Every day | ORAL | 0 refills | Status: DC

## 2021-10-05 MED ORDER — SODIUM CHLORIDE 0.9 % IV SOLN
INTRAVENOUS | Status: AC
  Filled 2021-10-05: qty 250

## 2021-10-05 MED ORDER — POTASSIUM CHLORIDE IN NACL 20-0.9 MEQ/L-% IV SOLN
INTRAVENOUS | Status: AC
  Filled 2021-10-05: qty 1000

## 2021-10-05 NOTE — Progress Notes (Signed)
Pt in follow up and fluids.  Pt did not understand she needed to pick up potassium and start orally yesterday.  ?

## 2021-10-05 NOTE — Progress Notes (Signed)
? ?[facility]pt[facility] Management [facility] ? ?[place] [facility] [place]. [facility] ?[address] ?[place], [place] [address] ?[phone] (phone) ?[phone] (fax) ? ?Patient Care Team: ?Virginia Crews, MD as PCP - General (Family Medicine) ?Theodore Demark, RN as Oncology Nurse Navigator  ? ?Name of the patient: Diamond Hinton  ?[id number]  ?08-20-1955  ? ?Date of visit: 10/05/21 ? ?Diagnosis- Metastatic breast cancer ? ?Chief complaint/ Reason for visit- Kidney Failure  ? ?Heme/Onc history:  ?Oncology History  ?Primary malignant neoplasm of breast with metastasis ([place])  ?08/29/2021 Initial Diagnosis  ? Metastatic breast cancer ([place]) ? ?  ?09/08/2021 -  Chemotherapy  ? Patient is on Treatment Plan : BREAST Abemaciclib + Fulvestrant q28d  ? ?  ?  ?09/20/2021 Cancer Staging  ? Staging form: Breast, AJCC 8th Edition ?- Pathologic stage from 09/20/2021: Stage IV (pT1, pNX, pM1, G3, ER+, PR+, HER2-) - Signed by Sindy Guadeloupe, MD on 09/20/2021 ?Multigene prognostic tests performed: None ?Histologic grading system: 3 grade system ? ?  ? ? ?Interval history-patient is 66 year old female with above history of metastatic breast cancer currently receiving ribociclib and letrozole who presented to symptom adjuvant clinic for acute kidney failure.  She was scheduled for routine follow-up after starting treatment with pharmacy.  Labs were drawn and she was found to have increased serum creatinine of 3.06 (baseline < 0.79).  In speaking with patient, she has had poor oral intake over the past several days.  Felt dehydrated and has been trying to increase fluid intake.  She was given 20 meq potassium IV, oral potassium sent to pharmacy, she had her HCTZ, ribociclib and metformin stopped. She reports today for follow up. ? ? ?Today patient states that she is feeling better than yesterday. She increased her fluid intake through sugar free Gatorade. She has held her  metformin, HCTZ, and Ribociclib medication. She did not get the potassium supplementation as she did not get notification from pharmacy per patient. No palpitations, chest pain, dizziness.  ? ?ECOG FS:2 - Symptomatic, <50% confined to bed ? ?Review of systems- Review of Systems  ?Constitutional:  Positive for malaise/fatigue and weight loss. Negative for chills and fever.  ?HENT:  Negative for hearing loss, nosebleeds, sore throat and tinnitus.   ?Eyes:  Negative for blurred vision and double vision.  ?Respiratory:  Negative for cough, hemoptysis, shortness of breath and wheezing.   ?Cardiovascular:  Negative for chest pain, palpitations and leg swelling.  ?Gastrointestinal:  Positive for nausea. Negative for abdominal pain, blood in stool, constipation, diarrhea, melena and vomiting.  ?Genitourinary:  Positive for frequency. Negative for dysuria, flank pain, hematuria and urgency.  ?Musculoskeletal:  Negative for back pain, falls, joint pain and myalgias.  ?Skin:  Negative for itching and rash.  ?Neurological:  Negative for dizziness, tingling, sensory change, loss of consciousness, weakness and headaches.  ?Endo/Heme/Allergies:  Negative for environmental allergies. Does not bruise/bleed easily.  ?Psychiatric/Behavioral:  Negative for depression. The patient is not nervous/anxious and does not have insomnia.    ? ?Current treatment- kisquali & letrozole ? ?Allergies  ?Allergen Reactions  ? Peanuts [Peanut Oil] Shortness Of Breath and Itching  ? ? ?Past Medical History:  ?Diagnosis Date  ? Anemia   ? Anxiety   ? Breast cancer ([place])   ? Gallstones   ? GERD (gastroesophageal reflux disease)   ? Hyperlipidemia   ? Hypertension   ? Jaundice 08/29/2021  ? Rash 08/29/2021  ?  Right upper quadrant abdominal pain 08/29/2021  ? UTI (urinary tract infection) 08/29/2021  ? ? ?Past Surgical History:  ?Procedure Laterality Date  ? BILATERAL SALPINGECTOMY  04/03/1997  ? CHOLECYSTECTOMY N/A 07/07/2015  ? Procedure: LAPAROSCOPIC  CHOLECYSTECTOMY ;  Surgeon: Jules Husbands, MD;  Location: [facility] ORS;  Service: General;  Laterality: N/A;  ? ESOPHAGOGASTRODUODENOSCOPY  08/30/2021  ? Procedure: ESOPHAGOGASTRODUODENOSCOPY (EGD);  Surgeon: Lucilla Lame, MD;  Location: [facility] ENDOSCOPY;  Service: Endoscopy;;  ? GALLBLADDER SURGERY  07/07/2015  ? [place]  ? IR BILIARY DRAIN PLACEMENT WITH CHOLANGIOGRAM  08/31/2021  ? IR CONVERT BILIARY DRAIN TO INT EXT BILIARY DRAIN  09/02/2021  ? ? ?Social History  ? ?Socioeconomic History  ? Marital status: Married  ?  Spouse name: Not on file  ? Number of children: Not on file  ? Years of education: Not on file  ? Highest education level: Not on file  ?Occupational History  ? Not on file  ?Tobacco Use  ? Smoking status: Never  ? Smokeless tobacco: Never  ?Vaping Use  ? Vaping Use: Never used  ?Substance and Sexual Activity  ? Alcohol use: No  ? Drug use: No  ? Sexual activity: Yes  ?Other Topics Concern  ? Not on file  ?Social History Narrative  ? Not on file  ? ?Social Determinants of Health  ? ?Financial Resource Strain: Not on file  ?Food Insecurity: Not on file  ?Transportation Needs: Not on file  ?Physical Activity: Not on file  ?Stress: Not on file  ?Social Connections: Not on file  ?Intimate Partner Violence: Not on file  ? ? ?Family History  ?Problem Relation Age of Onset  ? Hypertension Mother   ? CVA Mother   ? Ulcers Mother   ? Emphysema Mother   ? Heart disease Father   ? Prostate cancer Father   ? Macular degeneration Father   ? Hypertension Sister   ? Diabetes Sister   ? Cervical polyp Sister   ? Hypertension Sister   ? Diabetes Sister   ? Lymphoma Sister   ? ? ? ?Current Outpatient Medications:  ?  acetaminophen (TYLENOL) 325 MG tablet, Take 325 mg by mouth every 6 (six) hours as needed (for pain and sometimes takes 2 if pain is worse)., Disp: , Rfl:  ?  feeding supplement (ENSURE ENLIVE / ENSURE PLUS) LIQD, Take 237 mLs by mouth 3 (three) times daily between meals., Disp: 237 mL, Rfl: 12 ?  letrozole  (FEMARA) 2.5 MG tablet, Take 1 tablet (2.5 mg total) by mouth daily., Disp: 30 tablet, Rfl: 3 ?  levothyroxine (SYNTHROID) 75 MCG tablet, Take 1 tablet (75 mcg total) by mouth daily., Disp: 90 tablet, Rfl: 3 ?  Multiple Vitamin (MULTIVITAMIN WITH MINERALS) TABS tablet, Take 1 tablet by mouth daily. (Patient taking differently: Take 1 tablet by mouth daily. 5 alive), Disp: , Rfl:  ?  ondansetron (ZOFRAN) 4 MG tablet, Take 1 tablet (4 mg total) by mouth daily as needed for nausea or vomiting., Disp: 30 tablet, Rfl: 1 ?  pantoprazole (PROTONIX) 40 MG tablet, Take 1 tablet (40 mg total) by mouth daily., Disp: 30 tablet, Rfl: 11 ?  prochlorperazine (COMPAZINE) 5 MG tablet, Take 1 tablet (5 mg total) by mouth every 6 (six) hours as needed for refractory nausea / vomiting., Disp: 30 tablet, Rfl: 1 ?  sodium chloride 0.9 % injection, Inject 3 mLs into the vein as needed., Disp: 5 mL, Rfl: 2 ?  loratadine (CLARITIN) 10 MG tablet,  Take 10 mg by mouth daily as needed for allergies. (Patient not taking: Reported on 10/04/2021), Disp: , Rfl:  ?  oxyCODONE-acetaminophen (PERCOCET) 5-325 MG tablet, Take 1 tablet by mouth every 4 (four) hours as needed for severe pain. (Patient not taking: Reported on 10/04/2021), Disp: 20 tablet, Rfl: 0 ?  polyethylene glycol (MIRALAX / GLYCOLAX) 17 g packet, Take 17 g by mouth daily as needed. (Patient not taking: Reported on 10/04/2021), Disp: , Rfl:  ?  potassium chloride (KLOR-CON M) 10 MEQ tablet, Take 2 tablets (20 mEq total) by mouth daily., Disp: 14 tablet, Rfl: 0 ?  promethazine (PHENERGAN) 12.5 MG tablet, Take 1 tablet (12.5 mg total) by mouth every 6 (six) hours as needed for nausea or vomiting. (Patient not taking: Reported on 10/04/2021), Disp: 30 tablet, Rfl: 2 ?  ribociclib succ (KISQALI, 600 MG DOSE,) 200 MG Therapy Pack, Take 3 tablets (600 mg total) by mouth daily. Take for 21 days on, 7 days off, repeat every 28 days. (Patient not taking: Reported on 10/05/2021), Disp: 63 tablet,  Rfl: 0 ?  scopolamine (TRANSDERM-SCOP) 1 MG/3DAYS, Place 1 patch (1.5 mg total) onto the skin every 3 (three) days. (Patient not taking: Reported on 10/04/2021), Disp: 10 patch, Rfl: 12 ?No current facility-administered m

## 2021-10-06 ENCOUNTER — Inpatient Hospital Stay: Payer: Medicare HMO | Admitting: Medical Oncology

## 2021-10-06 ENCOUNTER — Other Ambulatory Visit: Payer: Self-pay | Admitting: Emergency Medicine

## 2021-10-06 ENCOUNTER — Inpatient Hospital Stay: Payer: Medicare HMO

## 2021-10-06 ENCOUNTER — Other Ambulatory Visit: Payer: Self-pay | Admitting: *Deleted

## 2021-10-06 VITALS — BP 113/72 | HR 67 | Temp 97.1°F | Resp 18 | Wt 128.0 lb

## 2021-10-06 DIAGNOSIS — I10 Essential (primary) hypertension: Secondary | ICD-10-CM | POA: Diagnosis not present

## 2021-10-06 DIAGNOSIS — E876 Hypokalemia: Secondary | ICD-10-CM | POA: Diagnosis not present

## 2021-10-06 DIAGNOSIS — C50912 Malignant neoplasm of unspecified site of left female breast: Secondary | ICD-10-CM

## 2021-10-06 DIAGNOSIS — E860 Dehydration: Secondary | ICD-10-CM

## 2021-10-06 DIAGNOSIS — E46 Unspecified protein-calorie malnutrition: Secondary | ICD-10-CM

## 2021-10-06 DIAGNOSIS — C784 Secondary malignant neoplasm of small intestine: Secondary | ICD-10-CM

## 2021-10-06 DIAGNOSIS — E1165 Type 2 diabetes mellitus with hyperglycemia: Secondary | ICD-10-CM

## 2021-10-06 DIAGNOSIS — D582 Other hemoglobinopathies: Secondary | ICD-10-CM | POA: Diagnosis not present

## 2021-10-06 DIAGNOSIS — K831 Obstruction of bile duct: Secondary | ICD-10-CM | POA: Diagnosis not present

## 2021-10-06 DIAGNOSIS — E871 Hypo-osmolality and hyponatremia: Secondary | ICD-10-CM | POA: Diagnosis not present

## 2021-10-06 DIAGNOSIS — N179 Acute kidney failure, unspecified: Secondary | ICD-10-CM

## 2021-10-06 DIAGNOSIS — Z170 Estrogen receptor positive status [ER+]: Secondary | ICD-10-CM | POA: Diagnosis not present

## 2021-10-06 DIAGNOSIS — C50919 Malignant neoplasm of unspecified site of unspecified female breast: Secondary | ICD-10-CM

## 2021-10-06 DIAGNOSIS — Z79899 Other long term (current) drug therapy: Secondary | ICD-10-CM | POA: Diagnosis not present

## 2021-10-06 DIAGNOSIS — Z7984 Long term (current) use of oral hypoglycemic drugs: Secondary | ICD-10-CM | POA: Diagnosis not present

## 2021-10-06 LAB — CBC WITH DIFFERENTIAL/PLATELET
Abs Immature Granulocytes: 0.04 10*3/uL (ref 0.00–0.07)
Basophils Absolute: 0 10*3/uL (ref 0.0–0.1)
Basophils Relative: 1 %
Eosinophils Absolute: 0 10*3/uL (ref 0.0–0.5)
Eosinophils Relative: 1 %
HCT: 38.5 % (ref 36.0–46.0)
Hemoglobin: 13.6 g/dL (ref 12.0–15.0)
Immature Granulocytes: 1 %
Lymphocytes Relative: 23 %
Lymphs Abs: 1.4 10*3/uL (ref 0.7–4.0)
MCH: 30.8 pg (ref 26.0–34.0)
MCHC: 35.3 g/dL (ref 30.0–36.0)
MCV: 87.1 fL (ref 80.0–100.0)
Monocytes Absolute: 0.3 10*3/uL (ref 0.1–1.0)
Monocytes Relative: 5 %
Neutro Abs: 4.4 10*3/uL (ref 1.7–7.7)
Neutrophils Relative %: 69 %
Platelets: 269 10*3/uL (ref 150–400)
RBC: 4.42 MIL/uL (ref 3.87–5.11)
RDW: 15.6 % — ABNORMAL HIGH (ref 11.5–15.5)
WBC: 6.2 10*3/uL (ref 4.0–10.5)
nRBC: 0 % (ref 0.0–0.2)

## 2021-10-06 LAB — COMPREHENSIVE METABOLIC PANEL
ALT: 39 U/L (ref 0–44)
AST: 32 U/L (ref 15–41)
Albumin: 3.8 g/dL (ref 3.5–5.0)
Alkaline Phosphatase: 67 U/L (ref 38–126)
Anion gap: 7 (ref 5–15)
BUN: 41 mg/dL — ABNORMAL HIGH (ref 8–23)
CO2: 24 mmol/L (ref 22–32)
Calcium: 8.8 mg/dL — ABNORMAL LOW (ref 8.9–10.3)
Chloride: 104 mmol/L (ref 98–111)
Creatinine, Ser: 1.57 mg/dL — ABNORMAL HIGH (ref 0.44–1.00)
GFR, Estimated: 36 mL/min — ABNORMAL LOW (ref >60)
Glucose, Bld: 185 mg/dL — ABNORMAL HIGH (ref 70–99)
Potassium: 3.3 mmol/L — ABNORMAL LOW (ref 3.5–5.1)
Sodium: 135 mmol/L (ref 135–145)
Total Bilirubin: 0.7 mg/dL (ref 0.3–1.2)
Total Protein: 7.2 g/dL (ref 6.5–8.1)

## 2021-10-06 MED ORDER — SODIUM CHLORIDE 0.9 % IV SOLN
Freq: Once | INTRAVENOUS | Status: AC
  Filled 2021-10-06: qty 250

## 2021-10-06 NOTE — Progress Notes (Signed)
Pt to receive 1 L NS only today. Discharged to home at completion. Pt initiated oral potassium. KCL up to 3.3 today. ?

## 2021-10-06 NOTE — Progress Notes (Signed)
? ?Symptom Management Clinic ? ?[facility] [place]. [facility] ?[address] ?[place], [place] [address] ?[phone] (phone) ?[phone] (fax) ? ?Patient Care Team: ?Virginia Crews, MD as PCP - General (Family Medicine) ?Theodore Demark, RN as Oncology Nurse Navigator  ? ?Name of the patient: Diamond Hinton  ?[id number]  ?[birth year]  ? ?Date of visit: 10/06/21 ? ?Diagnosis- Metastatic breast cancer ? ?Chief complaint/ Reason for visit- Kidney Failure  ? ?Heme/Onc history:  ?Oncology History  ?Primary malignant neoplasm of breast with metastasis ([place])  ?08/29/2021 Initial Diagnosis  ? Metastatic breast cancer ([place]) ? ?  ?09/08/2021 -  Chemotherapy  ? Patient is on Treatment Plan : BREAST Abemaciclib + Fulvestrant q28d  ? ?  ?  ?09/20/2021 Cancer Staging  ? Staging form: Breast, AJCC 8th Edition ?- Pathologic stage from 09/20/2021: Stage IV (pT1, pNX, pM1, G3, ER+, PR+, HER2-) - Signed by Sindy Guadeloupe, MD on 09/20/2021 ?Multigene prognostic tests performed: None ?Histologic grading system: 3 grade system ? ?  ? ? ?Interval history-patient is 66 year old female with above history of metastatic breast cancer currently receiving ribociclib and letrozole who presented to symptom adjuvant clinic for acute kidney failure.  She was scheduled for routine follow-up after starting treatment with pharmacy.  Labs were drawn and she was found to have increased serum creatinine of 3.06 (baseline < 0.79).  In speaking with patient, she has had poor oral intake over the past several days.  Felt dehydrated and has been trying to increase fluid intake.  She was given 20 meq potassium IV, oral potassium sent to pharmacy, she had her HCTZ, ribociclib and metformin stopped. Returned on 10/05/2021 where potassium was critically low and she received 20 MEQ and started oral replacement at home. She reports today for follow up. ? ?Today she reports that she  is feeling well. Fatigue has resolved. No N/V/D. Taking medications and supplements according to plan. Hydrating well.  ? ?ECOG FS:2 - Symptomatic, <50% confined to bed ? ?Review of systems- Review of Systems  ?Constitutional:  Negative for chills, fever, malaise/fatigue and weight loss.  ?HENT:  Negative for hearing loss, nosebleeds, sore throat and tinnitus.   ?Eyes:  Negative for blurred vision and double vision.  ?Respiratory:  Negative for cough, hemoptysis, shortness of breath and wheezing.   ?Cardiovascular:  Negative for chest pain, palpitations and leg swelling.  ?Gastrointestinal:  Negative for abdominal pain, blood in stool, constipation, diarrhea, melena, nausea and vomiting.  ?Genitourinary:  Negative for dysuria, flank pain, frequency, hematuria and urgency.  ?Musculoskeletal:  Negative for back pain, falls, joint pain and myalgias.  ?Skin:  Negative for itching and rash.  ?Neurological:  Negative for dizziness, tingling, sensory change, loss of consciousness, weakness and headaches.  ?Endo/Heme/Allergies:  Negative for environmental allergies. Does not bruise/bleed easily.  ?Psychiatric/Behavioral:  Negative for depression. The patient is not nervous/anxious and does not have insomnia.    ? ?Current treatment- kisquali & letrozole ? ?Allergies  ?Allergen Reactions  ? Peanuts [Peanut Oil] Shortness Of Breath and Itching  ? ? ?Past Medical History:  ?Diagnosis Date  ? Anemia   ? Anxiety   ? Breast cancer ([place])   ? Gallstones   ? GERD (gastroesophageal reflux disease)   ? Hyperlipidemia   ? Hypertension   ? Jaundice 08/29/2021  ? Rash 08/29/2021  ? Right upper quadrant abdominal pain 08/29/2021  ? UTI (urinary tract infection) 08/29/2021  ? ? ?  Past Surgical History:  ?Procedure Laterality Date  ? BILATERAL SALPINGECTOMY  04/03/1997  ? CHOLECYSTECTOMY N/A 07/07/2015  ? Procedure: LAPAROSCOPIC CHOLECYSTECTOMY ;  Surgeon: Jules Husbands, MD;  Location: [facility] ORS;  Service: General;  Laterality: N/A;  ?  ESOPHAGOGASTRODUODENOSCOPY  08/30/2021  ? Procedure: ESOPHAGOGASTRODUODENOSCOPY (EGD);  Surgeon: Lucilla Lame, MD;  Location: [facility] ENDOSCOPY;  Service: Endoscopy;;  ? GALLBLADDER SURGERY  07/07/2015  ? [place]  ? IR BILIARY DRAIN PLACEMENT WITH CHOLANGIOGRAM  08/31/2021  ? IR CONVERT BILIARY DRAIN TO INT EXT BILIARY DRAIN  09/02/2021  ? ? ?Social History  ? ?Socioeconomic History  ? Marital status: Married  ?  Spouse name: Not on file  ? Number of children: Not on file  ? Years of education: Not on file  ? Highest education level: Not on file  ?Occupational History  ? Not on file  ?Tobacco Use  ? Smoking status: Never  ? Smokeless tobacco: Never  ?Vaping Use  ? Vaping Use: Never used  ?Substance and Sexual Activity  ? Alcohol use: No  ? Drug use: No  ? Sexual activity: Yes  ?Other Topics Concern  ? Not on file  ?Social History Narrative  ? Not on file  ? ?Social Determinants of Health  ? ?Financial Resource Strain: Not on file  ?Food Insecurity: Not on file  ?Transportation Needs: Not on file  ?Physical Activity: Not on file  ?Stress: Not on file  ?Social Connections: Not on file  ?Intimate Partner Violence: Not on file  ? ? ?Family History  ?Problem Relation Age of Onset  ? Hypertension Mother   ? CVA Mother   ? Ulcers Mother   ? Emphysema Mother   ? Heart disease Father   ? Prostate cancer Father   ? Macular degeneration Father   ? Hypertension Sister   ? Diabetes Sister   ? Cervical polyp Sister   ? Hypertension Sister   ? Diabetes Sister   ? Lymphoma Sister   ? ? ? ?Current Outpatient Medications:  ?  acetaminophen (TYLENOL) 325 MG tablet, Take 325 mg by mouth every 6 (six) hours as needed (for pain and sometimes takes 2 if pain is worse)., Disp: , Rfl:  ?  feeding supplement (ENSURE ENLIVE / ENSURE PLUS) LIQD, Take 237 mLs by mouth 3 (three) times daily between meals., Disp: 237 mL, Rfl: 12 ?  letrozole (FEMARA) 2.5 MG tablet, Take 1 tablet (2.5 mg total) by mouth daily., Disp: 30 tablet, Rfl: 3 ?  levothyroxine  (SYNTHROID) 75 MCG tablet, Take 1 tablet (75 mcg total) by mouth daily., Disp: 90 tablet, Rfl: 3 ?  ondansetron (ZOFRAN) 4 MG tablet, Take 1 tablet (4 mg total) by mouth daily as needed for nausea or vomiting., Disp: 30 tablet, Rfl: 1 ?  pantoprazole (PROTONIX) 40 MG tablet, Take 1 tablet (40 mg total) by mouth daily., Disp: 30 tablet, Rfl: 11 ?  potassium chloride (KLOR-CON M) 10 MEQ tablet, Take 2 tablets (20 mEq total) by mouth daily., Disp: 14 tablet, Rfl: 0 ?  prochlorperazine (COMPAZINE) 5 MG tablet, Take 1 tablet (5 mg total) by mouth every 6 (six) hours as needed for refractory nausea / vomiting., Disp: 30 tablet, Rfl: 1 ?  loratadine (CLARITIN) 10 MG tablet, Take 10 mg by mouth daily as needed for allergies. (Patient not taking: Reported on 10/04/2021), Disp: , Rfl:  ?  Multiple Vitamin (MULTIVITAMIN WITH MINERALS) TABS tablet, Take 1 tablet by mouth daily. (Patient not taking: Reported on 10/06/2021), Disp: ,  Rfl:  ?  oxyCODONE-acetaminophen (PERCOCET) 5-325 MG tablet, Take 1 tablet by mouth every 4 (four) hours as needed for severe pain. (Patient not taking: Reported on 10/04/2021), Disp: 20 tablet, Rfl: 0 ?  polyethylene glycol (MIRALAX / GLYCOLAX) 17 g packet, Take 17 g by mouth daily as needed. (Patient not taking: Reported on 10/04/2021), Disp: , Rfl:  ?  promethazine (PHENERGAN) 12.5 MG tablet, Take 1 tablet (12.5 mg total) by mouth every 6 (six) hours as needed for nausea or vomiting. (Patient not taking: Reported on 10/06/2021), Disp: 30 tablet, Rfl: 2 ?  ribociclib succ (KISQALI, 600 MG DOSE,) 200 MG Therapy Pack, Take 3 tablets (600 mg total) by mouth daily. Take for 21 days on, 7 days off, repeat every 28 days. (Patient not taking: Reported on 10/05/2021), Disp: 63 tablet, Rfl: 0 ?  scopolamine (TRANSDERM-SCOP) 1 MG/3DAYS, Place 1 patch (1.5 mg total) onto the skin every 3 (three) days. (Patient not taking: Reported on 10/04/2021), Disp: 10 patch, Rfl: 12 ?  sodium chloride 0.9 % injection, Inject 3  mLs into the vein as needed. (Patient not taking: Reported on 10/06/2021), Disp: 5 mL, Rfl: 2 ? ?Physical exam:  ?Vitals:  ? 10/06/21 1058 10/06/21 1102  ?BP:  113/72  ?Pulse:  67  ?Resp: 18   ?Temp: (!) 97.1 ?F (36.2

## 2021-10-07 ENCOUNTER — Inpatient Hospital Stay: Payer: Medicare HMO

## 2021-10-07 ENCOUNTER — Inpatient Hospital Stay: Payer: Medicare HMO | Admitting: Medical Oncology

## 2021-10-07 ENCOUNTER — Other Ambulatory Visit: Payer: Self-pay

## 2021-10-07 ENCOUNTER — Inpatient Hospital Stay
Admission: EM | Admit: 2021-10-07 | Discharge: 2021-10-10 | DRG: 871 | Disposition: A | Discharge status: Home / Self Care | Payer: Medicare HMO | Attending: Student in an Organized Health Care Education/Training Program | Admitting: Student in an Organized Health Care Education/Training Program

## 2021-10-07 ENCOUNTER — Emergency Department: Payer: Medicare HMO

## 2021-10-07 ENCOUNTER — Encounter: Payer: Self-pay | Admitting: Emergency Medicine

## 2021-10-07 ENCOUNTER — Other Ambulatory Visit: Payer: Self-pay | Admitting: Emergency Medicine

## 2021-10-07 VITALS — BP 129/78 | HR 72 | Temp 96.5°F | Resp 16 | Ht 63.0 in | Wt 129.9 lb

## 2021-10-07 DIAGNOSIS — N179 Acute kidney failure, unspecified: Secondary | ICD-10-CM | POA: Diagnosis not present

## 2021-10-07 DIAGNOSIS — C784 Secondary malignant neoplasm of small intestine: Secondary | ICD-10-CM | POA: Diagnosis not present

## 2021-10-07 DIAGNOSIS — Z823 Family history of stroke: Secondary | ICD-10-CM

## 2021-10-07 DIAGNOSIS — E876 Hypokalemia: Secondary | ICD-10-CM

## 2021-10-07 DIAGNOSIS — A419 Sepsis, unspecified organism: Principal | ICD-10-CM | POA: Diagnosis present

## 2021-10-07 DIAGNOSIS — Z8249 Family history of ischemic heart disease and other diseases of the circulatory system: Secondary | ICD-10-CM

## 2021-10-07 DIAGNOSIS — E119 Type 2 diabetes mellitus without complications: Secondary | ICD-10-CM | POA: Diagnosis present

## 2021-10-07 DIAGNOSIS — Z825 Family history of asthma and other chronic lower respiratory diseases: Secondary | ICD-10-CM

## 2021-10-07 DIAGNOSIS — Z20822 Contact with and (suspected) exposure to covid-19: Secondary | ICD-10-CM | POA: Diagnosis present

## 2021-10-07 DIAGNOSIS — C50912 Malignant neoplasm of unspecified site of left female breast: Secondary | ICD-10-CM | POA: Diagnosis not present

## 2021-10-07 DIAGNOSIS — K831 Obstruction of bile duct: Secondary | ICD-10-CM | POA: Diagnosis not present

## 2021-10-07 DIAGNOSIS — E1165 Type 2 diabetes mellitus with hyperglycemia: Secondary | ICD-10-CM | POA: Diagnosis present

## 2021-10-07 DIAGNOSIS — F419 Anxiety disorder, unspecified: Secondary | ICD-10-CM | POA: Diagnosis present

## 2021-10-07 DIAGNOSIS — K311 Adult hypertrophic pyloric stenosis: Secondary | ICD-10-CM | POA: Diagnosis present

## 2021-10-07 DIAGNOSIS — I10 Essential (primary) hypertension: Secondary | ICD-10-CM | POA: Diagnosis not present

## 2021-10-07 DIAGNOSIS — E1159 Type 2 diabetes mellitus with other circulatory complications: Secondary | ICD-10-CM | POA: Diagnosis present

## 2021-10-07 DIAGNOSIS — E871 Hypo-osmolality and hyponatremia: Secondary | ICD-10-CM | POA: Diagnosis not present

## 2021-10-07 DIAGNOSIS — D582 Other hemoglobinopathies: Secondary | ICD-10-CM | POA: Diagnosis not present

## 2021-10-07 DIAGNOSIS — Z833 Family history of diabetes mellitus: Secondary | ICD-10-CM

## 2021-10-07 DIAGNOSIS — I700 Atherosclerosis of aorta: Secondary | ICD-10-CM | POA: Diagnosis not present

## 2021-10-07 DIAGNOSIS — K2960 Other gastritis without bleeding: Secondary | ICD-10-CM | POA: Diagnosis present

## 2021-10-07 DIAGNOSIS — R509 Fever, unspecified: Secondary | ICD-10-CM | POA: Diagnosis present

## 2021-10-07 DIAGNOSIS — R109 Unspecified abdominal pain: Secondary | ICD-10-CM | POA: Diagnosis not present

## 2021-10-07 DIAGNOSIS — Z7984 Long term (current) use of oral hypoglycemic drugs: Secondary | ICD-10-CM | POA: Diagnosis not present

## 2021-10-07 DIAGNOSIS — E860 Dehydration: Secondary | ICD-10-CM | POA: Diagnosis present

## 2021-10-07 DIAGNOSIS — J189 Pneumonia, unspecified organism: Secondary | ICD-10-CM | POA: Diagnosis not present

## 2021-10-07 DIAGNOSIS — R6520 Severe sepsis without septic shock: Secondary | ICD-10-CM | POA: Diagnosis present

## 2021-10-07 DIAGNOSIS — R918 Other nonspecific abnormal finding of lung field: Secondary | ICD-10-CM | POA: Diagnosis not present

## 2021-10-07 DIAGNOSIS — Z170 Estrogen receptor positive status [ER+]: Secondary | ICD-10-CM | POA: Diagnosis not present

## 2021-10-07 DIAGNOSIS — E785 Hyperlipidemia, unspecified: Secondary | ICD-10-CM | POA: Diagnosis present

## 2021-10-07 DIAGNOSIS — G479 Sleep disorder, unspecified: Secondary | ICD-10-CM | POA: Diagnosis not present

## 2021-10-07 DIAGNOSIS — I152 Hypertension secondary to endocrine disorders: Secondary | ICD-10-CM | POA: Diagnosis present

## 2021-10-07 DIAGNOSIS — Z807 Family history of other malignant neoplasms of lymphoid, hematopoietic and related tissues: Secondary | ICD-10-CM

## 2021-10-07 DIAGNOSIS — Z931 Gastrostomy status: Secondary | ICD-10-CM

## 2021-10-07 DIAGNOSIS — R000 Tachycardia, unspecified: Secondary | ICD-10-CM | POA: Diagnosis not present

## 2021-10-07 DIAGNOSIS — Z79899 Other long term (current) drug therapy: Secondary | ICD-10-CM | POA: Diagnosis not present

## 2021-10-07 DIAGNOSIS — C50919 Malignant neoplasm of unspecified site of unspecified female breast: Secondary | ICD-10-CM | POA: Diagnosis present

## 2021-10-07 DIAGNOSIS — E039 Hypothyroidism, unspecified: Secondary | ICD-10-CM | POA: Diagnosis present

## 2021-10-07 DIAGNOSIS — K219 Gastro-esophageal reflux disease without esophagitis: Secondary | ICD-10-CM | POA: Diagnosis present

## 2021-10-07 LAB — BASIC METABOLIC PANEL
Anion gap: 9 (ref 5–15)
BUN: 28 mg/dL — ABNORMAL HIGH (ref 8–23)
CO2: 21 mmol/L — ABNORMAL LOW (ref 22–32)
Calcium: 8.7 mg/dL — ABNORMAL LOW (ref 8.9–10.3)
Chloride: 106 mmol/L (ref 98–111)
Creatinine, Ser: 1.26 mg/dL — ABNORMAL HIGH (ref 0.44–1.00)
GFR, Estimated: 47 mL/min — ABNORMAL LOW (ref >60)
Glucose, Bld: 170 mg/dL — ABNORMAL HIGH (ref 70–99)
Potassium: 2.9 mmol/L — ABNORMAL LOW (ref 3.5–5.1)
Sodium: 136 mmol/L (ref 135–145)

## 2021-10-07 LAB — CBC WITH DIFFERENTIAL/PLATELET
Abs Immature Granulocytes: 0.02 10*3/uL (ref 0.00–0.07)
Basophils Absolute: 0 10*3/uL (ref 0.0–0.1)
Basophils Relative: 0 %
Eosinophils Absolute: 0 10*3/uL (ref 0.0–0.5)
Eosinophils Relative: 0 %
HCT: 36.4 % (ref 36.0–46.0)
Hemoglobin: 12.6 g/dL (ref 12.0–15.0)
Immature Granulocytes: 0 %
Lymphocytes Relative: 8 %
Lymphs Abs: 0.6 10*3/uL — ABNORMAL LOW (ref 0.7–4.0)
MCH: 30.6 pg (ref 26.0–34.0)
MCHC: 34.6 g/dL (ref 30.0–36.0)
MCV: 88.3 fL (ref 80.0–100.0)
Monocytes Absolute: 0.1 10*3/uL (ref 0.1–1.0)
Monocytes Relative: 1 %
Neutro Abs: 6.1 10*3/uL (ref 1.7–7.7)
Neutrophils Relative %: 91 %
Platelets: 289 10*3/uL (ref 150–400)
RBC: 4.12 MIL/uL (ref 3.87–5.11)
RDW: 15.5 % (ref 11.5–15.5)
Smear Review: NORMAL
WBC: 6.8 10*3/uL (ref 4.0–10.5)
nRBC: 0 % (ref 0.0–0.2)

## 2021-10-07 LAB — COMPREHENSIVE METABOLIC PANEL
ALT: 50 U/L — ABNORMAL HIGH (ref 0–44)
AST: 53 U/L — ABNORMAL HIGH (ref 15–41)
Albumin: 3.4 g/dL — ABNORMAL LOW (ref 3.5–5.0)
Alkaline Phosphatase: 71 U/L (ref 38–126)
Anion gap: 9 (ref 5–15)
BUN: 20 mg/dL (ref 8–23)
CO2: 19 mmol/L — ABNORMAL LOW (ref 22–32)
Calcium: 8.3 mg/dL — ABNORMAL LOW (ref 8.9–10.3)
Chloride: 109 mmol/L (ref 98–111)
Creatinine, Ser: 1.22 mg/dL — ABNORMAL HIGH (ref 0.44–1.00)
GFR, Estimated: 49 mL/min — ABNORMAL LOW (ref >60)
Glucose, Bld: 203 mg/dL — ABNORMAL HIGH (ref 70–99)
Potassium: 2.8 mmol/L — ABNORMAL LOW (ref 3.5–5.1)
Sodium: 137 mmol/L (ref 135–145)
Total Bilirubin: 2.2 mg/dL — ABNORMAL HIGH (ref 0.3–1.2)
Total Protein: 6.4 g/dL — ABNORMAL LOW (ref 6.5–8.1)

## 2021-10-07 LAB — LACTIC ACID, PLASMA: Lactic Acid, Venous: 3 mmol/L (ref 0.5–1.9)

## 2021-10-07 LAB — PROTIME-INR
INR: 1.2 (ref 0.8–1.2)
Prothrombin Time: 14.9 seconds (ref 11.4–15.2)

## 2021-10-07 MED ORDER — SODIUM CHLORIDE 0.9 % IV SOLN
2.0000 g | Freq: Once | INTRAVENOUS | Status: AC
  Administered 2021-10-07: 2 g via INTRAVENOUS
  Filled 2021-10-07: qty 12.5

## 2021-10-07 MED ORDER — TRAZODONE HCL 50 MG PO TABS: 50.0000 mg | ORAL_TABLET | Freq: Every evening | ORAL | 0 refills | Status: DC | PRN

## 2021-10-07 MED ORDER — ONDANSETRON HCL 4 MG/2ML IJ SOLN
4.0000 mg | Freq: Once | INTRAMUSCULAR | Status: AC
  Administered 2021-10-07: 4 mg via INTRAVENOUS
  Filled 2021-10-07: qty 2

## 2021-10-07 MED ORDER — POTASSIUM CHLORIDE IN NACL 20-0.9 MEQ/L-% IV SOLN
INTRAVENOUS | Status: AC
  Filled 2021-10-07: qty 1000

## 2021-10-07 MED ORDER — SODIUM CHLORIDE 0.9 % IV BOLUS (SEPSIS)
1000.0000 mL | Freq: Once | INTRAVENOUS | Status: AC
  Administered 2021-10-07: 1000 mL via INTRAVENOUS

## 2021-10-07 MED ORDER — METRONIDAZOLE 500 MG/100ML IV SOLN
500.0000 mg | Freq: Once | INTRAVENOUS | Status: AC
  Administered 2021-10-08: 500 mg via INTRAVENOUS
  Filled 2021-10-07: qty 100

## 2021-10-07 MED ORDER — MORPHINE SULFATE (PF) 2 MG/ML IV SOLN
2.0000 mg | Freq: Once | INTRAVENOUS | Status: AC
  Administered 2021-10-07: 2 mg via INTRAVENOUS
  Filled 2021-10-07: qty 1

## 2021-10-07 NOTE — Progress Notes (Signed)
Pt reports some nausea after breakfast this morning. Denies vomiting. ?

## 2021-10-07 NOTE — ED Notes (Signed)
Pt in triage room informed RN she has ringing in her ear and felt like she was going to pass out, rechecked BP 86/52.  ?

## 2021-10-07 NOTE — ED Notes (Signed)
Patient transported to CT 

## 2021-10-07 NOTE — Progress Notes (Signed)
? ?[facility] ? ?[facility] ?[address], [address] ?[place], [place] [address] ?[phone] (phone) ?[phone] (fax) ? ?Patient Care Team: ?Virginia Crews, MD as PCP - General (Family Medicine) ?Theodore Demark, RN as Oncology Nurse Navigator  ? ?Name of the patient: Diamond Hinton  ?[id number]  ?05-11-1956  ? ?Date of visit: 10/07/21 ? ?Diagnosis- Metastatic breast cancer ? ?Chief complaint/ Reason for visit- Kidney Failure  ? ?Heme/Onc history:  ?Oncology History  ?Primary malignant neoplasm of breast with metastasis ([place])  ?08/29/2021 Initial Diagnosis  ? Metastatic breast cancer ([place]) ? ?  ?09/08/2021 -  Chemotherapy  ? Patient is on Treatment Plan : BREAST Abemaciclib + Fulvestrant q28d  ? ?  ?  ?09/20/2021 Cancer Staging  ? Staging form: Breast, AJCC 8th Edition ?- Pathologic stage from 09/20/2021: Stage IV (pT1, pNX, pM1, G3, ER+, PR+, HER2-) - Signed by Sindy Guadeloupe, MD on 09/20/2021 ?Multigene prognostic tests performed: None ?Histologic grading system: 3 grade system ? ?  ? ? ?Interval history-patient is 66 year old female with above history of metastatic breast cancer currently receiving ribociclib and letrozole who presented to symptom adjuvant clinic for acute kidney failure.  She was scheduled for routine follow-up after starting treatment with pharmacy.  Labs were drawn and she was found to have increased serum creatinine of 3.06 (baseline < 0.79).  In speaking with patient, she has had poor oral intake over the past several days.  Felt dehydrated and has been trying to increase fluid intake.  She was given 20 meq potassium IV, oral potassium sent to pharmacy, she had her HCTZ, ribociclib and metformin stopped. Returned on 10/05/2021 where potassium was critically low and she received 20 MEQ and started oral replacement at home.  ? ?10/06/2021: she reports that she is feeling well. Fatigue  has resolved. No N/V/D. Taking medications and supplements according to plan. Hydrating well. Labs improving. Given 1L IVF in office. Continue oral potassium ? ?Today: She reports that she is feeling well other than some mild nausea after breakfast. She is eating and hydrating well. She reports that she is not sleeping well. This is a chronic issue but worse when she has morning cancer center appointment. She has tried Melatonin in the past with mild success but it does cause some morning grogginess.  ? ?ECOG FS:2 - Symptomatic, <50% confined to bed ? ?Review of systems- Review of Systems  ?Constitutional:  Negative for chills, fever, malaise/fatigue and weight loss.  ?HENT:  Negative for hearing loss, nosebleeds, sore throat and tinnitus.   ?Eyes:  Negative for blurred vision and double vision.  ?Respiratory:  Negative for cough, hemoptysis, shortness of breath and wheezing.   ?Cardiovascular:  Negative for chest pain, palpitations and leg swelling.  ?Gastrointestinal:  Negative for abdominal pain, blood in stool, constipation, diarrhea, melena, nausea and vomiting.  ?Genitourinary:  Negative for dysuria, flank pain, frequency, hematuria and urgency.  ?Musculoskeletal:  Negative for back pain, falls, joint pain and myalgias.  ?Skin:  Negative for itching and rash.  ?Neurological:  Negative for dizziness, tingling, sensory change, loss of consciousness, weakness and headaches.  ?Endo/Heme/Allergies:  Negative for environmental allergies. Does not bruise/bleed easily.  ?Psychiatric/Behavioral:  Negative for depression. The patient is not nervous/anxious and does not have insomnia.    ? ?Current treatment- kisquali & letrozole ? ?Allergies  ?Allergen Reactions  ? Peanuts [Peanut Oil] Shortness Of Breath and Itching  ? ? ?  Past Medical History:  ?Diagnosis Date  ? Anemia   ? Anxiety   ? Breast cancer ([place])   ? Gallstones   ? GERD (gastroesophageal reflux disease)   ? Hyperlipidemia   ? Hypertension   ? Jaundice  08/29/2021  ? Rash 08/29/2021  ? Right upper quadrant abdominal pain 08/29/2021  ? UTI (urinary tract infection) 08/29/2021  ? ? ?Past Surgical History:  ?Procedure Laterality Date  ? BILATERAL SALPINGECTOMY  04/03/1997  ? CHOLECYSTECTOMY N/A 07/07/2015  ? Procedure: LAPAROSCOPIC CHOLECYSTECTOMY ;  Surgeon: Jules Husbands, MD;  Location: [facility] ORS;  Service: General;  Laterality: N/A;  ? ESOPHAGOGASTRODUODENOSCOPY  08/30/2021  ? Procedure: ESOPHAGOGASTRODUODENOSCOPY (EGD);  Surgeon: Lucilla Lame, MD;  Location: [facility] ENDOSCOPY;  Service: Endoscopy;;  ? GALLBLADDER SURGERY  07/07/2015  ? [place]  ? IR BILIARY DRAIN PLACEMENT WITH CHOLANGIOGRAM  08/31/2021  ? IR CONVERT BILIARY DRAIN TO INT EXT BILIARY DRAIN  09/02/2021  ? ? ?Social History  ? ?Socioeconomic History  ? Marital status: Married  ?  Spouse name: Not on file  ? Number of children: Not on file  ? Years of education: Not on file  ? Highest education level: Not on file  ?Occupational History  ? Not on file  ?Tobacco Use  ? Smoking status: Never  ? Smokeless tobacco: Never  ?Vaping Use  ? Vaping Use: Never used  ?Substance and Sexual Activity  ? Alcohol use: No  ? Drug use: No  ? Sexual activity: Yes  ?Other Topics Concern  ? Not on file  ?Social History Narrative  ? Not on file  ? ?Social Determinants of Health  ? ?Financial Resource Strain: Not on file  ?Food Insecurity: Not on file  ?Transportation Needs: Not on file  ?Physical Activity: Not on file  ?Stress: Not on file  ?Social Connections: Not on file  ?Intimate Partner Violence: Not on file  ? ? ?Family History  ?Problem Relation Age of Onset  ? Hypertension Mother   ? CVA Mother   ? Ulcers Mother   ? Emphysema Mother   ? Heart disease Father   ? Prostate cancer Father   ? Macular degeneration Father   ? Hypertension Sister   ? Diabetes Sister   ? Cervical polyp Sister   ? Hypertension Sister   ? Diabetes Sister   ? Lymphoma Sister   ? ? ? ?Current Outpatient Medications:  ?  acetaminophen (TYLENOL) 325 MG tablet,  Take 325 mg by mouth every 6 (six) hours as needed (for pain and sometimes takes 2 if pain is worse)., Disp: , Rfl:  ?  feeding supplement (ENSURE ENLIVE / ENSURE PLUS) LIQD, Take 237 mLs by mouth 3 (three) times daily between meals., Disp: 237 mL, Rfl: 12 ?  letrozole (FEMARA) 2.5 MG tablet, Take 1 tablet (2.5 mg total) by mouth daily., Disp: 30 tablet, Rfl: 3 ?  levothyroxine (SYNTHROID) 75 MCG tablet, Take 1 tablet (75 mcg total) by mouth daily., Disp: 90 tablet, Rfl: 3 ?  ondansetron (ZOFRAN) 4 MG tablet, Take 1 tablet (4 mg total) by mouth daily as needed for nausea or vomiting., Disp: 30 tablet, Rfl: 1 ?  pantoprazole (PROTONIX) 40 MG tablet, Take 1 tablet (40 mg total) by mouth daily., Disp: 30 tablet, Rfl: 11 ?  potassium chloride (KLOR-CON M) 10 MEQ tablet, Take 2 tablets (20 mEq total) by mouth daily., Disp: 14 tablet, Rfl: 0 ?  prochlorperazine (COMPAZINE) 5 MG tablet, Take 1 tablet (5 mg total) by mouth every  6 (six) hours as needed for refractory nausea / vomiting., Disp: 30 tablet, Rfl: 1 ?  loratadine (CLARITIN) 10 MG tablet, Take 10 mg by mouth daily as needed for allergies. (Patient not taking: Reported on 10/04/2021), Disp: , Rfl:  ?  Multiple Vitamin (MULTIVITAMIN WITH MINERALS) TABS tablet, Take 1 tablet by mouth daily. (Patient not taking: Reported on 10/06/2021), Disp: , Rfl:  ?  oxyCODONE-acetaminophen (PERCOCET) 5-325 MG tablet, Take 1 tablet by mouth every 4 (four) hours as needed for severe pain. (Patient not taking: Reported on 10/04/2021), Disp: 20 tablet, Rfl: 0 ?  polyethylene glycol (MIRALAX / GLYCOLAX) 17 g packet, Take 17 g by mouth daily as needed. (Patient not taking: Reported on 10/04/2021), Disp: , Rfl:  ?  promethazine (PHENERGAN) 12.5 MG tablet, Take 1 tablet (12.5 mg total) by mouth every 6 (six) hours as needed for nausea or vomiting. (Patient not taking: Reported on 10/06/2021), Disp: 30 tablet, Rfl: 2 ?  ribociclib succ (KISQALI, 600 MG DOSE,) 200 MG Therapy Pack, Take 3 tablets  (600 mg total) by mouth daily. Take for 21 days on, 7 days off, repeat every 28 days. (Patient not taking: Reported on 10/05/2021), Disp: 63 tablet, Rfl: 0 ?  scopolamine (TRANSDERM-SCOP) 1 MG/3DAYS, Place 1 p

## 2021-10-07 NOTE — ED Triage Notes (Signed)
Pt reports she was going to the cancer center to get IV infusions of potassium, reports her creatinine levels were elevated. Reports she was taking Kisqali and stopped on Monday. Pt reports she has not been feeling well. Took Tylenol around 8pm. Reports has had frequent bowel movements.  ?

## 2021-10-07 NOTE — ED Provider Notes (Signed)
? ?[facility] ?Provider Note ? ? ? Event Date/Time  ? First MD Initiated Contact with Patient 10/07/21 2304   ?  (approximate) ? ? ?History  ? ?Fever ? ? ?HPI ? ?Diamond Hinton is a 66 y.o. female referred to the ED by her oncologist for admission.  Patient with a history of breast cancer who was taken off Kisqali 5 days ago due to increasing creatinine levels and decreasing potassium levels.  She received IV fluids and IV potassium each day on Tuesday, Wednesday, Thursday and Friday.  Felt unwell after her infusion last evening and spiked a fever at home.  Took Tylenol around 8 PM.  Has biliary drain in place, s/p gastrojejunostomy bypassing metastatic duodenal/biliary obstruction.  Endorses generalized weakness and mild abdominal pain.  Took a Zofran around 8 PM and Compazine around 10 PM.  Currently denies nausea.  Denies cough, congestion, chest pain, shortness of breath, dysuria or diarrhea. ?  ? ? ?Past Medical History  ? ?Past Medical History:  ?Diagnosis Date  ? Anemia   ? Anxiety   ? Breast cancer ([place])   ? Gallstones   ? GERD (gastroesophageal reflux disease)   ? Hyperlipidemia   ? Hypertension   ? Jaundice 08/29/2021  ? Rash 08/29/2021  ? Right upper quadrant abdominal pain 08/29/2021  ? UTI (urinary tract infection) 08/29/2021  ? ? ? ?Active Problem List  ? ?Patient Active Problem List  ? Diagnosis Date Noted  ? Severe sepsis ([place]) 10/08/2021  ? Pneumonia 10/08/2021  ? AKI (acute kidney injury) ([place]) 10/08/2021  ? Fever 10/08/2021  ? Goals of care, counseling/discussion   ? Palliative care encounter   ? Hypophosphatemia 09/05/2021  ? Constipation 09/04/2021  ? Stenosis of duodenum   ? Obstructive jaundice 08/29/2021  ? Primary malignant neoplasm of breast with metastasis ([place]) 08/29/2021  ? History of cholecystectomy 08/29/2021  ? Gastric outlet obstruction 08/29/2021  ? Hypokalemia 08/29/2021  ? Uncontrolled type 2 diabetes mellitus with hyperglycemia, without long-term current use of  insulin ([place]) 08/29/2021  ? Elevated lipase 08/29/2021  ? Reflux gastritis 08/04/2021  ? Diabetes mellitus without complication ([place]) 09/73/5329  ? Hypertension 04/18/2021  ? Hyperlipidemia associated with type 2 diabetes mellitus ([place]) 02/19/2015  ? Abnormal LFTs 02/19/2015  ? Adult hypothyroidism 02/19/2015  ? ? ? ?Past Surgical History  ? ?Past Surgical History:  ?Procedure Laterality Date  ? BILATERAL SALPINGECTOMY  04/03/1997  ? CHOLECYSTECTOMY N/A 07/07/2015  ? Procedure: LAPAROSCOPIC CHOLECYSTECTOMY ;  Surgeon: Jules Husbands, MD;  Location: [facility] ORS;  Service: General;  Laterality: N/A;  ? ESOPHAGOGASTRODUODENOSCOPY  08/30/2021  ? Procedure: ESOPHAGOGASTRODUODENOSCOPY (EGD);  Surgeon: Lucilla Lame, MD;  Location: [facility] ENDOSCOPY;  Service: Endoscopy;;  ? GALLBLADDER SURGERY  07/07/2015  ? [place]  ? IR BILIARY DRAIN PLACEMENT WITH CHOLANGIOGRAM  08/31/2021  ? IR CONVERT BILIARY DRAIN TO INT EXT BILIARY DRAIN  09/02/2021  ? ? ? ?Home Medications  ? ?Prior to Admission medications   ?Medication Sig Start Date End Date Taking? Authorizing Provider  ?acetaminophen (TYLENOL) 325 MG tablet Take 325 mg by mouth every 6 (six) hours as needed (for pain and sometimes takes 2 if pain is worse).    [provider]  ?feeding supplement (ENSURE ENLIVE / ENSURE PLUS) LIQD Take 237 mLs by mouth 3 (three) times daily between meals. 09/14/21   Annita Brod, MD  ?letrozole [facility]) 2.5 MG tablet Take 1 tablet (2.5 mg total) by mouth daily. 09/20/21   Randa Evens  C, MD  ?levothyroxine (SYNTHROID) 75 MCG tablet Take 1 tablet (75 mcg total) by mouth daily. 10/12/20   Virginia Crews, MD  ?loratadine (CLARITIN) 10 MG tablet Take 10 mg by mouth daily as needed for allergies. ?Patient not taking: Reported on 10/04/2021    [provider]  ?Multiple Vitamin (MULTIVITAMIN WITH MINERALS) TABS tablet Take 1 tablet by mouth daily. ?Patient not taking: Reported on 10/06/2021 09/15/21   Annita Brod, MD  ?ondansetron  (ZOFRAN) 4 MG tablet Take 1 tablet (4 mg total) by mouth daily as needed for nausea or vomiting. 09/14/21 09/14/22  Annita Brod, MD  ?oxyCODONE-acetaminophen (PERCOCET) 5-325 MG tablet Take 1 tablet by mouth every 4 (four) hours as needed for severe pain. ?Patient not taking: Reported on 10/04/2021 09/14/21 09/14/22  Annita Brod, MD  ?pantoprazole (PROTONIX) 40 MG tablet Take 1 tablet (40 mg total) by mouth daily. 09/14/21 09/14/22  Annita Brod, MD  ?polyethylene glycol (MIRALAX / GLYCOLAX) 17 g packet Take 17 g by mouth daily as needed. ?Patient not taking: Reported on 10/04/2021    [provider]  ?potassium chloride (KLOR-CON M) 10 MEQ tablet Take 2 tablets (20 mEq total) by mouth daily. 10/05/21   Hughie Closs, PA-C  ?prochlorperazine (COMPAZINE) 5 MG tablet Take 1 tablet (5 mg total) by mouth every 6 (six) hours as needed for refractory nausea / vomiting. 09/14/21   Annita Brod, MD  ?promethazine (PHENERGAN) 12.5 MG tablet Take 1 tablet (12.5 mg total) by mouth every 6 (six) hours as needed for nausea or vomiting. ?Patient not taking: Reported on 10/06/2021 09/14/21   Annita Brod, MD  ?ribociclib succ (KISQALI, 600 MG DOSE,) 200 MG Therapy Pack Take 3 tablets (600 mg total) by mouth daily. Take for 21 days on, 7 days off, repeat every 28 days. ?Patient not taking: Reported on 10/05/2021 09/15/21   Sindy Guadeloupe, MD  ?scopolamine (TRANSDERM-SCOP) 1 MG/3DAYS Place 1 patch (1.5 mg total) onto the skin every 3 (three) days. ?Patient not taking: Reported on 10/04/2021 09/16/21   Annita Brod, MD  ?sodium chloride 0.9 % injection Inject 3 mLs into the vein as needed. ?Patient not taking: Reported on 10/06/2021 09/28/21   Ronny Bacon, MD  ?traZODone (DESYREL) 50 MG tablet Take 1 tablet (50 mg total) by mouth at bedtime as needed for sleep. 10/07/21   Hughie Closs, PA-C  ? ? ? ?Allergies  ?Peanuts [peanut oil] ? ? ?Family History  ? ?Family History  ?Problem Relation Age of Onset   ? Hypertension Mother   ? CVA Mother   ? Ulcers Mother   ? Emphysema Mother   ? Heart disease Father   ? Prostate cancer Father   ? Macular degeneration Father   ? Hypertension Sister   ? Diabetes Sister   ? Cervical polyp Sister   ? Hypertension Sister   ? Diabetes Sister   ? Lymphoma Sister   ? ? ? ?Physical Exam  ?Triage Vital Signs: ?ED Triage Vitals  ?Enc Vitals Group  ?   BP 10/07/21 2227 137/64  ?   Pulse Rate 10/07/21 2227 (!) 133  ?   Resp 10/07/21 2227 19  ?   Temp 10/07/21 2227 (!) 100.5 ?F (38.1 ?C)  ?   Temp Source 10/07/21 2227 Oral  ?   SpO2 10/07/21 2227 93 %  ?   Weight 10/07/21 2228 130 lb (59 kg)  ?   Height 10/07/21 2228 '5\' 3"'$  (  1.6 m)  ?   Head Circumference --   ?   Peak Flow --   ?   Pain Score 10/07/21 2227 2  ?   Pain Loc --   ?   Pain Edu? --   ?   Excl. in [place]? --   ? ? ?Updated Vital Signs: ?BP 118/71 (BP Location: Right Arm)   Pulse 95   Temp 99.1 ?F (37.3 ?C) (Oral)   Resp 20   Ht '5\' 3"'$  (1.6 m)   Wt 59 kg   SpO2 96%   BMI 23.03 kg/m?  ? ? ?General: Awake, no distress.  ?CV:  Tachycardic.  Good peripheral perfusion.  ?Resp:  Normal effort.  CTA B. ?Abd:  Mild diffuse tenderness to palpation without rebound or guarding.  Drain in place.  No distention.  ?Other:  No tenderness or swelling to calves ? ? ?ED Results / Procedures / Treatments  ?Labs ?(all labs ordered are listed, but only abnormal results are displayed) ?Labs Reviewed  ?COMPREHENSIVE METABOLIC PANEL - Abnormal; Notable for the following components:  ?    Result Value  ? Potassium 2.8 (*)   ? CO2 19 (*)   ? Glucose, Bld 203 (*)   ? Creatinine, Ser 1.22 (*)   ? Calcium 8.3 (*)   ? Total Protein 6.4 (*)   ? Albumin 3.4 (*)   ? AST 53 (*)   ? ALT 50 (*)   ? Total Bilirubin 2.2 (*)   ? GFR, Estimated 49 (*)   ? All other components within normal limits  ?LACTIC ACID, PLASMA - Abnormal; Notable for the following components:  ? Lactic Acid, Venous 3.0 (*)   ? All other components within normal limits  ?CBC WITH  DIFFERENTIAL/PLATELET - Abnormal; Notable for the following components:  ? Lymphs Abs 0.6 (*)   ? All other components within normal limits  ?LACTIC ACID, PLASMA - Abnormal; Notable for the following components:  ? Lactic Ac

## 2021-10-07 NOTE — Progress Notes (Signed)
Pt being followed by [facility] for Sepsis protocol. 

## 2021-10-07 NOTE — Progress Notes (Signed)
PHARMACY -  BRIEF ANTIBIOTIC NOTE  ? ?Pharmacy has received consult(s) for Cefepime from an ED provider.  The patient's profile has been reviewed for ht/wt/allergies/indication/available labs.   ? ?One time order(s) placed for Cefepime 2 gm IV X 1.  ? ?Further antibiotics/pharmacy consults should be ordered by admitting physician if indicated.       ?                ?Thank you, ?Anquanette Bahner D ?10/07/2021  11:16 PM ? ?

## 2021-10-08 ENCOUNTER — Other Ambulatory Visit: Payer: Self-pay

## 2021-10-08 ENCOUNTER — Encounter: Payer: Self-pay | Admitting: Internal Medicine

## 2021-10-08 DIAGNOSIS — F419 Anxiety disorder, unspecified: Secondary | ICD-10-CM | POA: Diagnosis not present

## 2021-10-08 DIAGNOSIS — J189 Pneumonia, unspecified organism: Secondary | ICD-10-CM | POA: Diagnosis not present

## 2021-10-08 DIAGNOSIS — E1165 Type 2 diabetes mellitus with hyperglycemia: Secondary | ICD-10-CM | POA: Diagnosis not present

## 2021-10-08 DIAGNOSIS — Z931 Gastrostomy status: Secondary | ICD-10-CM | POA: Diagnosis not present

## 2021-10-08 DIAGNOSIS — K311 Adult hypertrophic pyloric stenosis: Secondary | ICD-10-CM | POA: Diagnosis not present

## 2021-10-08 DIAGNOSIS — Z823 Family history of stroke: Secondary | ICD-10-CM | POA: Diagnosis not present

## 2021-10-08 DIAGNOSIS — N179 Acute kidney failure, unspecified: Secondary | ICD-10-CM | POA: Diagnosis present

## 2021-10-08 DIAGNOSIS — Z825 Family history of asthma and other chronic lower respiratory diseases: Secondary | ICD-10-CM | POA: Diagnosis not present

## 2021-10-08 DIAGNOSIS — E876 Hypokalemia: Secondary | ICD-10-CM | POA: Diagnosis not present

## 2021-10-08 DIAGNOSIS — Z20822 Contact with and (suspected) exposure to covid-19: Secondary | ICD-10-CM | POA: Diagnosis not present

## 2021-10-08 DIAGNOSIS — E860 Dehydration: Secondary | ICD-10-CM | POA: Diagnosis not present

## 2021-10-08 DIAGNOSIS — A419 Sepsis, unspecified organism: Secondary | ICD-10-CM | POA: Diagnosis present

## 2021-10-08 DIAGNOSIS — K2960 Other gastritis without bleeding: Secondary | ICD-10-CM | POA: Diagnosis not present

## 2021-10-08 DIAGNOSIS — E785 Hyperlipidemia, unspecified: Secondary | ICD-10-CM | POA: Diagnosis not present

## 2021-10-08 DIAGNOSIS — K219 Gastro-esophageal reflux disease without esophagitis: Secondary | ICD-10-CM | POA: Diagnosis not present

## 2021-10-08 DIAGNOSIS — R509 Fever, unspecified: Secondary | ICD-10-CM | POA: Diagnosis present

## 2021-10-08 DIAGNOSIS — Z833 Family history of diabetes mellitus: Secondary | ICD-10-CM | POA: Diagnosis not present

## 2021-10-08 DIAGNOSIS — Z170 Estrogen receptor positive status [ER+]: Secondary | ICD-10-CM | POA: Diagnosis not present

## 2021-10-08 DIAGNOSIS — Z807 Family history of other malignant neoplasms of lymphoid, hematopoietic and related tissues: Secondary | ICD-10-CM | POA: Diagnosis not present

## 2021-10-08 DIAGNOSIS — Z8249 Family history of ischemic heart disease and other diseases of the circulatory system: Secondary | ICD-10-CM | POA: Diagnosis not present

## 2021-10-08 DIAGNOSIS — K831 Obstruction of bile duct: Secondary | ICD-10-CM | POA: Diagnosis not present

## 2021-10-08 DIAGNOSIS — E039 Hypothyroidism, unspecified: Secondary | ICD-10-CM | POA: Diagnosis not present

## 2021-10-08 DIAGNOSIS — I10 Essential (primary) hypertension: Secondary | ICD-10-CM | POA: Diagnosis not present

## 2021-10-08 DIAGNOSIS — E119 Type 2 diabetes mellitus without complications: Secondary | ICD-10-CM | POA: Diagnosis not present

## 2021-10-08 DIAGNOSIS — C784 Secondary malignant neoplasm of small intestine: Secondary | ICD-10-CM | POA: Diagnosis not present

## 2021-10-08 DIAGNOSIS — R6520 Severe sepsis without septic shock: Secondary | ICD-10-CM | POA: Diagnosis not present

## 2021-10-08 DIAGNOSIS — C50919 Malignant neoplasm of unspecified site of unspecified female breast: Secondary | ICD-10-CM | POA: Diagnosis not present

## 2021-10-08 LAB — GLUCOSE, CAPILLARY
Glucose-Capillary: 147 mg/dL — ABNORMAL HIGH (ref 70–99)
Glucose-Capillary: 150 mg/dL — ABNORMAL HIGH (ref 70–99)
Glucose-Capillary: 136 mg/dL — ABNORMAL HIGH (ref 70–99)

## 2021-10-08 LAB — CBC WITH DIFFERENTIAL/PLATELET
Abs Immature Granulocytes: 0.04 10*3/uL (ref 0.00–0.07)
Basophils Absolute: 0 10*3/uL (ref 0.0–0.1)
Basophils Relative: 0 %
Eosinophils Absolute: 0 10*3/uL (ref 0.0–0.5)
Eosinophils Relative: 0 %
HCT: 31.2 % — ABNORMAL LOW (ref 36.0–46.0)
Hemoglobin: 10.7 g/dL — ABNORMAL LOW (ref 12.0–15.0)
Immature Granulocytes: 1 %
Lymphocytes Relative: 6 %
Lymphs Abs: 0.4 10*3/uL — ABNORMAL LOW (ref 0.7–4.0)
MCH: 30.7 pg (ref 26.0–34.0)
MCHC: 34.3 g/dL (ref 30.0–36.0)
MCV: 89.4 fL (ref 80.0–100.0)
Monocytes Absolute: 0.2 10*3/uL (ref 0.1–1.0)
Monocytes Relative: 3 %
Neutro Abs: 6.1 10*3/uL (ref 1.7–7.7)
Neutrophils Relative %: 90 %
Platelets: 171 10*3/uL (ref 150–400)
RBC: 3.49 MIL/uL — ABNORMAL LOW (ref 3.87–5.11)
RDW: 15.9 % — ABNORMAL HIGH (ref 11.5–15.5)
WBC: 6.8 10*3/uL (ref 4.0–10.5)
nRBC: 0 % (ref 0.0–0.2)

## 2021-10-08 LAB — COMPREHENSIVE METABOLIC PANEL
ALT: 48 U/L — ABNORMAL HIGH (ref 0–44)
AST: 56 U/L — ABNORMAL HIGH (ref 15–41)
Albumin: 2.7 g/dL — ABNORMAL LOW (ref 3.5–5.0)
Alkaline Phosphatase: 55 U/L (ref 38–126)
Anion gap: 6 (ref 5–15)
BUN: 17 mg/dL (ref 8–23)
CO2: 22 mmol/L (ref 22–32)
Calcium: 7.4 mg/dL — ABNORMAL LOW (ref 8.9–10.3)
Chloride: 113 mmol/L — ABNORMAL HIGH (ref 98–111)
Creatinine, Ser: 1.25 mg/dL — ABNORMAL HIGH (ref 0.44–1.00)
GFR, Estimated: 47 mL/min — ABNORMAL LOW (ref >60)
Glucose, Bld: 170 mg/dL — ABNORMAL HIGH (ref 70–99)
Potassium: 3.4 mmol/L — ABNORMAL LOW (ref 3.5–5.1)
Sodium: 141 mmol/L (ref 135–145)
Total Bilirubin: 1.9 mg/dL — ABNORMAL HIGH (ref 0.3–1.2)
Total Protein: 5 g/dL — ABNORMAL LOW (ref 6.5–8.1)

## 2021-10-08 LAB — RESP PANEL BY RT-PCR (FLU A&B, COVID) ARPGX2
Influenza A by PCR: NEGATIVE
Influenza B by PCR: NEGATIVE
SARS Coronavirus 2 by RT PCR: NEGATIVE

## 2021-10-08 LAB — LACTIC ACID, PLASMA
Lactic Acid, Venous: 3.1 mmol/L (ref 0.5–1.9)
Lactic Acid, Venous: 3 mmol/L (ref 0.5–1.9)

## 2021-10-08 LAB — PROCALCITONIN: Procalcitonin: 0.25 ng/mL

## 2021-10-08 LAB — CORTISOL-AM, BLOOD: Cortisol - AM: 22.8 ug/dL — ABNORMAL HIGH (ref 6.7–22.6)

## 2021-10-08 LAB — LIPASE, BLOOD: Lipase: 170 U/L — ABNORMAL HIGH (ref 11–51)

## 2021-10-08 LAB — MAGNESIUM: Magnesium: 1.1 mg/dL — ABNORMAL LOW (ref 1.7–2.4)

## 2021-10-08 MED ORDER — POTASSIUM CHLORIDE 10 MEQ/100ML IV SOLN
10.0000 meq | INTRAVENOUS | Status: AC
  Administered 2021-10-08 (×4): 10 meq via INTRAVENOUS
  Filled 2021-10-08 (×4): qty 100

## 2021-10-08 MED ORDER — SODIUM CHLORIDE 0.9 % IV SOLN
2.0000 g | Freq: Two times a day (BID) | INTRAVENOUS | Status: DC
  Administered 2021-10-08 – 2021-10-10 (×5): 2 g via INTRAVENOUS
  Filled 2021-10-08: qty 2
  Filled 2021-10-08: qty 12.5
  Filled 2021-10-08 (×3): qty 2
  Filled 2021-10-08: qty 12.5

## 2021-10-08 MED ORDER — SODIUM CHLORIDE 0.9 % IV BOLUS (SEPSIS)
1000.0000 mL | Freq: Once | INTRAVENOUS | Status: AC
  Administered 2021-10-08: 1000 mL via INTRAVENOUS

## 2021-10-08 MED ORDER — ACETAMINOPHEN 650 MG RE SUPP: 650.0000 mg | Freq: Four times a day (QID) | RECTAL | Status: DC | PRN

## 2021-10-08 MED ORDER — POTASSIUM CHLORIDE 10 MEQ/100ML IV SOLN
10.0000 meq | Freq: Once | INTRAVENOUS | Status: AC
  Administered 2021-10-08: 10 meq via INTRAVENOUS
  Filled 2021-10-08: qty 100

## 2021-10-08 MED ORDER — LACTATED RINGERS IV SOLN: INTRAVENOUS | Status: DC

## 2021-10-08 MED ORDER — POTASSIUM CHLORIDE 10 MEQ/100ML IV SOLN
10.0000 meq | Freq: Once | INTRAVENOUS | Status: AC
  Administered 2021-10-08: 10 meq via INTRAVENOUS
  Filled 2021-10-08 (×2): qty 100

## 2021-10-08 MED ORDER — MAGNESIUM SULFATE 2 GM/50ML IV SOLN
2.0000 g | Freq: Once | INTRAVENOUS | Status: AC
  Administered 2021-10-08: 2 g via INTRAVENOUS
  Filled 2021-10-08: qty 50

## 2021-10-08 MED ORDER — PANTOPRAZOLE SODIUM 40 MG IV SOLR
40.0000 mg | INTRAVENOUS | Status: DC
Start: 2021-10-09 — End: 2021-10-08

## 2021-10-08 MED ORDER — PANTOPRAZOLE SODIUM 40 MG PO TBEC
40.0000 mg | DELAYED_RELEASE_TABLET | Freq: Every day | ORAL | Status: DC
  Administered 2021-10-09 – 2021-10-10 (×2): 40 mg via ORAL
  Filled 2021-10-08 (×2): qty 1

## 2021-10-08 MED ORDER — HEPARIN SODIUM (PORCINE) 5000 UNIT/ML IJ SOLN
5000.0000 [IU] | Freq: Three times a day (TID) | INTRAMUSCULAR | Status: DC
  Administered 2021-10-08 – 2021-10-09 (×5): 5000 [IU] via SUBCUTANEOUS
  Filled 2021-10-08 (×5): qty 1

## 2021-10-08 MED ORDER — PANTOPRAZOLE SODIUM 40 MG IV SOLR
40.0000 mg | Freq: Two times a day (BID) | INTRAVENOUS | Status: DC
Start: 2021-10-08 — End: 2021-10-08
  Administered 2021-10-08: 40 mg via INTRAVENOUS
  Filled 2021-10-08: qty 10

## 2021-10-08 MED ORDER — METRONIDAZOLE 500 MG/100ML IV SOLN
500.0000 mg | Freq: Two times a day (BID) | INTRAVENOUS | Status: DC
  Administered 2021-10-08 – 2021-10-10 (×5): 500 mg via INTRAVENOUS
  Filled 2021-10-08 (×5): qty 100

## 2021-10-08 MED ORDER — ONDANSETRON HCL 4 MG/2ML IJ SOLN: 4.0000 mg | Freq: Four times a day (QID) | INTRAMUSCULAR | Status: DC | PRN

## 2021-10-08 MED ORDER — HYDRALAZINE HCL 20 MG/ML IJ SOLN: 10.0000 mg | Freq: Four times a day (QID) | INTRAMUSCULAR | Status: DC | PRN

## 2021-10-08 MED ORDER — ENSURE ENLIVE PO LIQD
237.0000 mL | Freq: Three times a day (TID) | ORAL | Status: DC
  Administered 2021-10-08 – 2021-10-09 (×3): 237 mL via ORAL

## 2021-10-08 MED ORDER — ADULT MULTIVITAMIN W/MINERALS CH
1.0000 | ORAL_TABLET | Freq: Every day | ORAL | Status: DC
  Administered 2021-10-08 – 2021-10-10 (×3): 1 via ORAL
  Filled 2021-10-08 (×3): qty 1

## 2021-10-08 MED ORDER — ACETAMINOPHEN 325 MG PO TABS
650.0000 mg | ORAL_TABLET | Freq: Four times a day (QID) | ORAL | Status: DC | PRN
  Administered 2021-10-08 – 2021-10-09 (×5): 650 mg via ORAL
  Filled 2021-10-08 (×5): qty 2

## 2021-10-08 MED ORDER — POTASSIUM CHLORIDE 10 MEQ/100ML IV SOLN
10.0000 meq | Freq: Once | INTRAVENOUS | Status: AC
  Administered 2021-10-08: 10 meq via INTRAVENOUS

## 2021-10-08 MED ORDER — SODIUM CHLORIDE 0.9 % IV BOLUS (SEPSIS): 1000.0000 mL | Freq: Once | INTRAVENOUS | Status: DC

## 2021-10-08 MED ORDER — ONDANSETRON HCL 4 MG/2ML IJ SOLN
4.0000 mg | Freq: Four times a day (QID) | INTRAMUSCULAR | Status: DC
  Administered 2021-10-08 (×2): 4 mg via INTRAVENOUS
  Filled 2021-10-08 (×2): qty 2

## 2021-10-08 MED ORDER — INSULIN ASPART 100 UNIT/ML IJ SOLN
0.0000 [IU] | Freq: Three times a day (TID) | INTRAMUSCULAR | Status: DC
  Administered 2021-10-08 (×2): 1 [IU] via SUBCUTANEOUS
  Filled 2021-10-08 (×2): qty 1

## 2021-10-08 NOTE — Assessment & Plan Note (Signed)
?    Latest Ref Rng & Units 10/07/2021  ? 10:46 PM 10/07/2021  ?  8:36 AM 10/06/2021  ? 10:03 AM  ?BMP  ?Glucose 70 - 99 mg/dL 203   170   185    ?BUN 8 - 23 mg/dL 20   28   41    ?Creatinine 0.44 - 1.00 mg/dL 1.22   1.26   1.57    ?Sodium 135 - 145 mmol/L 137   136   135    ?Potassium 3.5 - 5.1 mmol/L 2.8   2.9   3.3    ?Chloride 98 - 111 mmol/L 109   106   104    ?CO2 22 - 32 mmol/L '19   21   24    '$ ?Calcium 8.9 - 10.3 mg/dL 8.3   8.7   8.8    ?pt received potassium in ed we will follow level and replace as needed. ? ?

## 2021-10-08 NOTE — Assessment & Plan Note (Signed)
Continue levothyroxine at 75 mcg.  ?

## 2021-10-08 NOTE — Assessment & Plan Note (Signed)
S/p GJ tube. ?Ct stable.  ?

## 2021-10-08 NOTE — Progress Notes (Signed)
Pharmacy Antibiotic Note ? ?Diamond Hinton is a 66 y.o. female admitted on 10/07/2021 with sepsis.  Pharmacy has been consulted for Cefepime dosing. ? ?Plan: ?Cefepime 2 gm IV X 1 given in ED on 4/28 @ 2348. ?Cefepime 2 gm IV Q12H ordered to continue on 4/29 @ 1200.  ? ?Height: '5\' 3"'$  (160 cm) ?Weight: 59 kg (130 lb) ?IBW/kg (Calculated) : 52.4 ? ?Temp (24hrs), Avg:98.5 ?F (36.9 ?C), Min:96.5 ?F (35.8 ?C), Max:100.5 ?F (38.1 ?C) ? ?Recent Labs  ?Lab 10/04/21 ?2440 10/05/21 ?1056 10/06/21 ?1003 10/07/21 ?1027 10/07/21 ?2246 10/08/21 ?0037  ?WBC 7.8 6.9 6.2  --  6.8  --   ?CREATININE 3.06* 2.09* 1.57* 1.26* 1.22*  --   ?LATICACIDVEN  --   --   --   --  3.0* 3.1*  ?  ?Estimated Creatinine Clearance: 37 mL/min (A) (by C-G formula based on SCr of 1.22 mg/dL (H)).   ? ?Allergies  ?Allergen Reactions  ? Peanuts [Peanut Oil] Shortness Of Breath and Itching  ? ? ?Antimicrobials this admission: ?  >>  ?  >>  ? ?Dose adjustments this admission: ? ? ?Microbiology results: ? BCx:  ? UCx:   ? Sputum:   ? MRSA PCR:  ? ?Thank you for allowing pharmacy to be a part of this patient?s care. ? ?Starlena Beil D ?10/08/2021 2:05 AM ? ?

## 2021-10-08 NOTE — Progress Notes (Signed)
Initial Nutrition Assessment ? ?DOCUMENTATION CODES:  ? ?Not applicable ? ?INTERVENTION:  ? ?Ensure Enlive po TID, each supplement provides 350 kcal and 20 grams of protein. Would not change supplement to Glucerna as patient needs the increased protein and calories provided by Ensure Enlive/Plus.  ? ?MVI with minerals daily. ? ?NUTRITION DIAGNOSIS:  ? ?Increased nutrient needs related to cancer and cancer related treatments as evidenced by estimated needs. ? ?GOAL:  ? ?Patient will meet greater than or equal to 90% of their needs ? ?MONITOR:  ? ?PO intake, Supplement acceptance ? ?REASON FOR ASSESSMENT:  ? ?Malnutrition Screening Tool ?  ? ?ASSESSMENT:  ? ?66 yo female admitted with fever, referred from Oncology office. PMH includes HTN, HLD, GERD, metastatic breast cancer, gallstones, GOO s/p GJ tube 09/08/21. ? ?RD working remotely. ?Unable to reach patient by phone.  ?Patient has had recent abdominal pain, nausea, and vomiting. She developed hypokalemia.  She is being followed by the RD at the cancer center with recommendations to be pro-active with her nausea medications so she can tolerate intake of more calories and protein.  ? ?Currently on a carbohydrate modified diet. 0% meal intake documented x 1 meal at 06:54 this morning. ? ?Biliary drain in place. Output not documented. ? ?Labs reviewed. K 3.4, mag 1.1 ?CBG: 150-136 ? ?Medications reviewed and include Novolog, KCl. ?IVF: LR at 100 ml/h ? ?Weight history reviewed.  ?Wt Readings from Last 10 Encounters:  ?10/07/21 59 kg  ?10/07/21 58.9 kg  ?10/06/21 58.1 kg  ?10/05/21 56.7 kg  ?09/22/21 58.4 kg  ?09/20/21 58.7 kg  ?09/13/21 63.2 kg  ?08/29/21 64 kg  ?08/04/21 63.5 kg  ?04/18/21 64.6 kg  ? ?Weight started trending down in April. ?8% weight loss within 1 month is severe. ? ?Patient is at increase nutrition risk. Likely malnourished. Unable to obtain enough information at this time for identification of malnutrition.  ? ?NUTRITION - FOCUSED PHYSICAL  EXAM: ? ?Unable to complete ? ?Diet Order:   ?Diet Order   ? ?       ?  Diet Carb Modified Fluid consistency: Thin; Room service appropriate? Yes  Diet effective now       ?  ? ?  ?  ? ?  ? ? ?EDUCATION NEEDS:  ? ?No education needs have been identified at this time ? ?Skin:  Skin Assessment: Reviewed RN Assessment ? ?Last BM:  4/29 ? ?Height:  ? ?Ht Readings from Last 1 Encounters:  ?10/07/21 '5\' 3"'$  (1.6 m)  ? ? ?Weight:  ? ?Wt Readings from Last 1 Encounters:  ?10/07/21 59 kg  ? ? ?BMI:  Body mass index is 23.03 kg/m?. ? ?Estimated Nutritional Needs:  ? ?Kcal:  1800-2100 ? ?Protein:  90-105 gm ? ?Fluid:  1.8-2.1 L ? ? ? ?Lucas Mallow RD, LDN, CNSC ?Please refer to Amion for contact information.                                                       ? ?

## 2021-10-08 NOTE — Assessment & Plan Note (Signed)
Pt to f/u with Dr. Janese Banks on may 10th.  ?

## 2021-10-08 NOTE — Assessment & Plan Note (Signed)
We will continue cefepime and flagyl.  ?Supportive care.  ?MIVF with LR.  ? ?

## 2021-10-08 NOTE — H&P (Signed)
?History and Physical  ? ? ?Patient: Diamond Hinton GNF:[id number] DOB: 1955-07-15 ?DOA: 10/07/2021 ?DOS: the patient was seen and examined on 10/08/2021 ?PCP: Virginia Crews, MD  ?Patient coming from: Home ? ?Chief Complaint:  ?Chief Complaint  ?Patient presents with  ? Fever  ? ?HPI: Diamond Hinton is a 66 y.o. female with medical history significant of HTN, GERD, Breast cancer, and Gallstones presenting with fevers referred from oncology office.  Patient has been hypokalemic and has been feeling sick with nausea and vomiting.  Patient was recently taken off the case [place] due to AKI. Pt developed hypokalemia.  Mild abdominal pain and generalized weakness.  Patient currently denies any nausea ?Patient has been taking Zofran and Compazine. ?Pt states she has been going for iv infusion for potassium because she got dehydrated.  ?Pt has not had vomiting but has had multiple bowel movement in the day, about 3 a day. ?Today she felt cold and had fever , she had soup and felt chill and took tylenol she then felt she was shaking  ?And was on heating pad then her temp then was 100. ? ?Patient was discharged on 09/14/2021 with a GJ tube placed on 3/30 and a biliary drain placed by IR on 3/22. ?Patient had gastric outlet obstruction due to metastatic breast cancer and had a GJ tube placed. ? ? ?Review of Systems:  ?Review of Systems  ?Constitutional:  Positive for chills, fever, malaise/fatigue and weight loss.  ?Gastrointestinal:  Positive for abdominal pain, nausea and vomiting.  ?All other systems reviewed and are negative. ? ?Past Medical History:  ?Diagnosis Date  ? Anemia   ? Anxiety   ? Breast cancer ([place])   ? Gallstones   ? GERD (gastroesophageal reflux disease)   ? Hyperlipidemia   ? Hypertension   ? Jaundice 08/29/2021  ? Rash 08/29/2021  ? Right upper quadrant abdominal pain 08/29/2021  ? UTI (urinary tract infection) 08/29/2021  ? ?Past Surgical History:  ?Procedure Laterality Date  ? BILATERAL SALPINGECTOMY   04/03/1997  ? CHOLECYSTECTOMY N/A 07/07/2015  ? Procedure: LAPAROSCOPIC CHOLECYSTECTOMY ;  Surgeon: Jules Husbands, MD;  Location: [facility] ORS;  Service: General;  Laterality: N/A;  ? ESOPHAGOGASTRODUODENOSCOPY  08/30/2021  ? Procedure: ESOPHAGOGASTRODUODENOSCOPY (EGD);  Surgeon: Lucilla Lame, MD;  Location: [facility] ENDOSCOPY;  Service: Endoscopy;;  ? GALLBLADDER SURGERY  07/07/2015  ? [place]  ? IR BILIARY DRAIN PLACEMENT WITH CHOLANGIOGRAM  08/31/2021  ? IR CONVERT BILIARY DRAIN TO INT EXT BILIARY DRAIN  09/02/2021  ? ?Social History:  reports that she has never smoked. She has never used smokeless tobacco. She reports that she does not drink alcohol and does not use drugs. ? ?Allergies  ?Allergen Reactions  ? Peanuts [Peanut Oil] Shortness Of Breath and Itching  ? ? ?Family History  ?Problem Relation Age of Onset  ? Hypertension Mother   ? CVA Mother   ? Ulcers Mother   ? Emphysema Mother   ? Heart disease Father   ? Prostate cancer Father   ? Macular degeneration Father   ? Hypertension Sister   ? Diabetes Sister   ? Cervical polyp Sister   ? Hypertension Sister   ? Diabetes Sister   ? Lymphoma Sister   ? ? ?Prior to Admission medications   ?Medication Sig Start Date End Date Taking? Authorizing Provider  ?acetaminophen (TYLENOL) 325 MG tablet Take 325 mg by mouth every 6 (six) hours as needed (for pain and sometimes takes 2 if pain is worse).  [provider]  ?feeding supplement (ENSURE ENLIVE / ENSURE PLUS) LIQD Take 237 mLs by mouth 3 (three) times daily between meals. 09/14/21   Annita Brod, MD  ?letrozole [facility]) 2.5 MG tablet Take 1 tablet (2.5 mg total) by mouth daily. 09/20/21   Sindy Guadeloupe, MD  ?levothyroxine (SYNTHROID) 75 MCG tablet Take 1 tablet (75 mcg total) by mouth daily. 10/12/20   Virginia Crews, MD  ?loratadine (CLARITIN) 10 MG tablet Take 10 mg by mouth daily as needed for allergies. ?Patient not taking: Reported on 10/04/2021    [provider]  ?Multiple Vitamin  (MULTIVITAMIN WITH MINERALS) TABS tablet Take 1 tablet by mouth daily. ?Patient not taking: Reported on 10/06/2021 09/15/21   Annita Brod, MD  ?ondansetron (ZOFRAN) 4 MG tablet Take 1 tablet (4 mg total) by mouth daily as needed for nausea or vomiting. 09/14/21 09/14/22  Annita Brod, MD  ?oxyCODONE-acetaminophen (PERCOCET) 5-325 MG tablet Take 1 tablet by mouth every 4 (four) hours as needed for severe pain. ?Patient not taking: Reported on 10/04/2021 09/14/21 09/14/22  Annita Brod, MD  ?pantoprazole (PROTONIX) 40 MG tablet Take 1 tablet (40 mg total) by mouth daily. 09/14/21 09/14/22  Annita Brod, MD  ?polyethylene glycol (MIRALAX / GLYCOLAX) 17 g packet Take 17 g by mouth daily as needed. ?Patient not taking: Reported on 10/04/2021    [provider]  ?potassium chloride (KLOR-CON M) 10 MEQ tablet Take 2 tablets (20 mEq total) by mouth daily. 10/05/21   Hughie Closs, PA-C  ?prochlorperazine (COMPAZINE) 5 MG tablet Take 1 tablet (5 mg total) by mouth every 6 (six) hours as needed for refractory nausea / vomiting. 09/14/21   Annita Brod, MD  ?promethazine (PHENERGAN) 12.5 MG tablet Take 1 tablet (12.5 mg total) by mouth every 6 (six) hours as needed for nausea or vomiting. ?Patient not taking: Reported on 10/06/2021 09/14/21   Annita Brod, MD  ?ribociclib succ (KISQALI, 600 MG DOSE,) 200 MG Therapy Pack Take 3 tablets (600 mg total) by mouth daily. Take for 21 days on, 7 days off, repeat every 28 days. ?Patient not taking: Reported on 10/05/2021 09/15/21   Sindy Guadeloupe, MD  ?scopolamine (TRANSDERM-SCOP) 1 MG/3DAYS Place 1 patch (1.5 mg total) onto the skin every 3 (three) days. ?Patient not taking: Reported on 10/04/2021 09/16/21   Annita Brod, MD  ?sodium chloride 0.9 % injection Inject 3 mLs into the vein as needed. ?Patient not taking: Reported on 10/06/2021 09/28/21   Ronny Bacon, MD  ?traZODone (DESYREL) 50 MG tablet Take 1 tablet (50 mg total) by mouth at bedtime as  needed for sleep. 10/07/21   Hughie Closs, PA-C  ? ? ?Physical Exam: ?Vitals:  ? 10/08/21 0045 10/08/21 0100 10/08/21 0115 10/08/21 0130  ?BP: 125/68 123/68 123/67 120/67  ?Pulse: (!) 101 (!) 101 98 95  ?Resp: 20  (!) 21 (!) 23  ?Temp:      ?TempSrc:      ?SpO2: 96% 94% 93% 94%  ?Weight:      ?Height:      ?Physical Exam ?Vitals and nursing note reviewed.  ?Constitutional:   ?   General: She is not in acute distress. ?   Appearance: Normal appearance. She is not ill-appearing, toxic-appearing or diaphoretic.  ?HENT:  ?   Head: Normocephalic and atraumatic.  ?   Right Ear: Hearing and external ear normal.  ?   Left Ear: Hearing and external ear normal.  ?  Nose: Nose normal. No nasal deformity.  ?   Mouth/Throat:  ?   Lips: Pink.  ?   Mouth: Mucous membranes are moist.  ?   Tongue: No lesions.  ?   Pharynx: Oropharynx is clear.  ?Eyes:  ?   Extraocular Movements: Extraocular movements intact.  ?   Pupils: Pupils are equal, round, and reactive to light.  ?Cardiovascular:  ?   Rate and Rhythm: Normal rate and regular rhythm.  ?   Pulses: Normal pulses.  ?   Heart sounds: Normal heart sounds.  ?Pulmonary:  ?   Effort: Pulmonary effort is normal.  ?   Breath sounds: Rales present.  ? ? ?Abdominal:  ?   General: Bowel sounds are normal. There is no distension.  ?   Palpations: Abdomen is soft. There is no mass.  ?   Tenderness: There is no abdominal tenderness. There is no guarding.  ?   Hernia: No hernia is present.  ? ? ?   Comments: Biliary drain present.   ?Musculoskeletal:  ?   Right lower leg: No edema.  ?   Left lower leg: No edema.  ?Skin: ?   General: Skin is warm.  ?Neurological:  ?   General: No focal deficit present.  ?   Mental Status: She is alert and oriented to person, place, and time.  ?   Cranial Nerves: Cranial nerves 2-12 are intact.  ?   Motor: Motor function is intact.  ?Psychiatric:     ?   Attention and Perception: Attention normal.     ?   Mood and Affect: Mood normal.     ?   Speech: Speech  normal.     ?   Behavior: Behavior normal. Behavior is cooperative.     ?   Cognition and Memory: Cognition normal.  ? ?Data Reviewed: ?Results for orders placed or performed during the hospital encounter of 04/28/2

## 2021-10-08 NOTE — ED Notes (Signed)
Hospital provider at bedside

## 2021-10-08 NOTE — Assessment & Plan Note (Signed)
Blood pressure 120/67, pulse 95, temperature (!) 100.5 ?F (38.1 ?C), temperature source Oral, resp. rate (!) 23, height '5\' 3"'$  (1.6 m), weight 59 kg, SpO2 94 %. ?PRN tylenol.  ?Follow cultures. ?

## 2021-10-08 NOTE — TOC Initial Note (Signed)
Transition of Care (TOC) - Initial/Assessment Note  ? ? ?Patient Details  ?Name: Diamond Hinton ?MRN: [id number] ?Date of Birth: Aug 26, 1955 ? ?Transition of Care (TOC) CM/SW Contact:    ?Anant Agard E Erendida Wrenn, LCSW ?Phone Number: ?10/08/2021, 2:09 PM ? ?Clinical Narrative:                Completed high readmission risk assessment.  ?Patient lives with her husband. Patient drives herself to appointments, or daughter takes her when needed.  ?PCP is Dr. Jacinto Reap. Pharmacy is [facility]. No DME, HH, or SNF history.  ?Patient is listed as Medicaid Potential, however confirmed with patient and daughter that patient has [employer]. This is listed on Facesheet as 2nd insurance.  ?No TOC needs at this time.  ? ? ?Expected Discharge Plan: Home/Self Care ?Barriers to Discharge: Continued Medical Work up ? ? ?Patient Goals and CMS Choice ?Patient states their goals for this hospitalization and ongoing recovery are:: home with spouse ?CMS Medicare.gov Compare Post Acute Care list provided to:: Patient ?Choice offered to / list presented to : Patient ? ?Expected Discharge Plan and Services ?Expected Discharge Plan: Home/Self Care ?  ?  ?  ?Living arrangements for the past 2 months: [place] ?                ?  ?  ?  ?  ?  ?  ?  ?  ?  ?  ? ?Prior Living Arrangements/Services ?Living arrangements for the past 2 months: [place] ?Lives with:: Spouse ?Patient language and need for interpreter reviewed:: Yes ?Do you feel safe going back to the place where you live?: Yes      ?Need for Family Participation in Patient Care: Yes (Comment) ?Care giver support system in place?: Yes (comment) ?  ?Criminal Activity/Legal Involvement Pertinent to Current Situation/Hospitalization: No - Comment as needed ? ?Activities of Daily Living ?Home Assistive Devices/Equipment: None ?ADL Screening (condition at time of admission) ?Patient's cognitive ability adequate to safely complete daily activities?: Yes ?Is the patient deaf or  have difficulty hearing?: No ?Does the patient have difficulty seeing, even when wearing glasses/contacts?: No ?Does the patient have difficulty concentrating, remembering, or making decisions?: No ?Patient able to express need for assistance with ADLs?: Yes ?Does the patient have difficulty dressing or bathing?: No ?Independently performs ADLs?: Yes (appropriate for developmental age) ?Does the patient have difficulty walking or climbing stairs?: No ?Weakness of Legs: None ?Weakness of Arms/Hands: None ? ?Permission Sought/Granted ?Permission sought to share information with : Customer service manager ?Permission granted to share information with : Yes, Verbal Permission Granted ?   ?   ?   ?   ? ?Emotional Assessment ?  ?  ?  ?Orientation: : Oriented to Self, Oriented to Place, Oriented to  Time, Oriented to Situation ?Alcohol / Substance Use: Not Applicable ?Psych Involvement: No (comment) ? ?Admission diagnosis:  Hypokalemia [E87.6] ?Fever [R50.9] ?AKI (acute kidney injury) ([place]) [N17.9] ?Community acquired pneumonia, unspecified laterality [J18.9] ?Sepsis, due to unspecified organism, unspecified whether acute organ dysfunction present ([place]) [A41.9] ?Patient Active Problem List  ? Diagnosis Date Noted  ? Severe sepsis ([place]) 10/08/2021  ? Pneumonia 10/08/2021  ? AKI (acute kidney injury) ([place]) 10/08/2021  ? Fever 10/08/2021  ? Goals of care, counseling/discussion   ? Palliative care encounter   ? Hypophosphatemia 09/05/2021  ? Constipation 09/04/2021  ? Stenosis of duodenum   ? Obstructive jaundice 08/29/2021  ? Primary malignant neoplasm of breast with  metastasis ([place]) 08/29/2021  ? History of cholecystectomy 08/29/2021  ? Gastric outlet obstruction 08/29/2021  ? Hypokalemia 08/29/2021  ? Uncontrolled type 2 diabetes mellitus with hyperglycemia, without long-term current use of insulin ([place]) 08/29/2021  ? Elevated lipase 08/29/2021  ? Reflux gastritis 08/04/2021  ? Diabetes mellitus without complication  ([place]) 12/06/9483  ? Hypertension 04/18/2021  ? Hyperlipidemia associated with type 2 diabetes mellitus ([place]) 02/19/2015  ? Abnormal LFTs 02/19/2015  ? Adult hypothyroidism 02/19/2015  ? ?PCP:  Virginia Crews, MD ?Pharmacy:   ?[place], [address] ?[place] [address] ?Phone: [phone] Fax: [phone] ? ?[place], [address] ?[address] ?[place] [address] ?Phone: [phone] Fax: [phone] ? ?[place], [address] ?[address] ?[place] [address] ?Phone: [phone] Fax: [phone] ? ?[facility] ?[address] ?[place] [address] ?Phone: [phone] Fax: [phone] ? ?[facility] - [place], [address] ?[place] [address] ?Phone: [phone] Fax: [phone] ? ? ? ? ?Social Determinants of Health (SDOH) Interventions ?  ? ?Readmission Risk Interventions ? ?  10/08/2021  ?  2:08 PM  ?Readmission Risk Prevention Plan  ?Transportation Screening Complete  ?PCP or Specialist Appt within 3-5 Days Complete  ?[place] or Home Care Consult Complete  ?Social Work Consult for [place] Planning/Counseling Complete  ?Palliative Care Screening Complete  ?Medication Review Press photographer) Complete  ? ? ? ?

## 2021-10-08 NOTE — Assessment & Plan Note (Signed)
protonix iv q12h.  ?

## 2021-10-08 NOTE — Assessment & Plan Note (Signed)
Blood pressure 120/67, pulse 95, temperature (!) 100.5 ?F (38.1 ?C), temperature source Oral, resp. rate (!) 23, height '5\' 3"'$  (1.6 m), weight 59 kg, SpO2 94 %. ?BP is low normal we will hold her hctz .  ?Follow bp and Prn hydralazine . ? ?

## 2021-10-08 NOTE — Progress Notes (Addendum)
Bilary drain is leaking from the skin and is not draining like it usually drain per pt. The stitching is noted to be detached from the skin. I emptied 168m around 1400 and there is no drainage in the bag at this time, but leakage from site. According to patient, it was last emptied before she came to the hospital and the bag should have been full with drainage overnight, but it did not. She has flushed it twice today with 594m She is also complaining of shallow breathing due to tenderness and fullness. Dr. AnOuida Sillsotified. ? ?Surgery consulted, recommended to disconnect drainage bag and place stoma bag around drain. Drainage bag disconnected and colostomy bag placed to collect fluid and protect the skin around the drain site. Bilary tube placed inside of bag to collect drainage. ?

## 2021-10-08 NOTE — Assessment & Plan Note (Signed)
Lab Results  ?Component Value Date  ? CREATININE 1.22 (H) 10/07/2021  ? CREATININE 1.26 (H) 10/07/2021  ? CREATININE 1.57 (H) 10/06/2021  ?stable avoid contrast and renally dose all meds.  ? ?

## 2021-10-08 NOTE — Progress Notes (Signed)
?PROGRESS NOTE ? ?Diamond Hinton    DOB: 31-Jul-1955, 66 y.o.  ?ACZ:[id number]  ?  Code Status: Full Code   ?DOA: 10/07/2021   LOS: 0  ? ?Brief hospital course  ?Diamond Hinton is a 66 y.o. female with a PMH significant for HTN, GERD, Breast cancer, and Gallstones. ? ?They presented from home to the ED on 10/07/2021 with nausea and vomiting x several days. She is regularly symptomatic in setting of her metastatic breast cancer causing duodenal outlet obstruction and treatments. She receives regular IV infusions with potassium supplements at oncology clinic and last appointment was 4/28. ?She is s/p external biliary drain with GJ tube ? ?In the ED, it was found that they had dehydration and hypokalemia.  ?Significant findings included Magnesium 1.1, K+ 3.4, Cr 1.25, AST/ALT 56/48, total bilirubin 1.9, LA 3.0. CT abdomen did not show significant obstruction of bowel or biliary tree. Chest xray showed signs of bilateral lower lobe PNA. ?Vital signs were stable on admission with exception of mild fever of 100.5 ? ?They were treated with IV fluids, potassium supplementation, zofran, PPI.  ?Patient was admitted to medicine service for further workup and management of sepsis as outlined in detail below. ? ?10/08/21 -stable, improved ? ?Assessment & Plan  ?Principal Problem: ?  Fever ?Active Problems: ?  Severe sepsis ([place]) ?  Pneumonia ?  Uncontrolled type 2 diabetes mellitus with hyperglycemia, without long-term current use of insulin ([place]) ?  AKI (acute kidney injury) ([place]) ?  Adult hypothyroidism ?  Hypertension ?  Reflux gastritis ?  Gastric outlet obstruction ?  Primary malignant neoplasm of breast with metastasis ([place]) ?  Hypokalemia ? ?Severe Sepsis  PNA- patient met sepsis criteria with fever, elevated LA, tachycardia, and tachypnea and source suspected to be lungs with signs of pneumonia on chest xray. Normal WBC count which may be reduced response in relative immune depressed state. She remains stable ORA and denies  respiratory complaints.  ?- continue IV Abx ?- follow up BxCx ?- monitor fever curve ?- analgesia and antipyretics PRN ?- CBC am ?- incentive spirometer/flutter valve ? 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 Janese Banks, last appointment 4/11 with 4 week follow up plan. Current therapy: ribociclib and letrozole. S/p palliative gastrojejunostomy. ?- monitor liver function ?- ECG ?- will FYI her oncology team of her admission for further input on her care ? ?Refractory N/V  dehydration  physical deconditioning- related to above ?- antiemetics PRN ?- daily CMP to monitor electrolytes/kidney function ?- PT/OT ? ?Hypokalemia  hypomagnesemia - K+ 2.8>3.4 s/p replacement. Mg++ 1.1 ?- replete K+ and Mg++ ?- BMP am ? ?Type II DM- recent A1c 6.9.  ?- holding home oral medications currently for possible contribution to N/V ?- monitor blood sugars with regular labs. Can add on sliding scale if needed ? ?Hypothyroidism- last TSH 11/22 was normal.  ?- continue home therapy ?- repeat TSH this admission to confirm that it is not contributing to her current symptoms ? ?HTN- normotensive this admission without any home therapy ?- vitals per floor protocol ? ? ?Body mass index is 23.03 kg/m?. ? ?VTE ppx: heparin injection 5,000 Units Start: 10/08/21 0600 ? ? ?Diet:  ?   ?Diet  ? Diet Carb Modified Fluid consistency: Thin; Room service appropriate? Yes  ? ?Consultants: ?none ?Subjective 10/08/21   ? ?Pt reports doing much better. Denies respiratory symptoms. Tolerating breakfast.  ?  ?Objective  ? ?  Vitals:  ? 10/08/21 0200 10/08/21 0210 10/08/21 0241 10/08/21 0413  ?BP: 119/69  119/74 118/71  ?Pulse: 97  97 95  ?Resp: 16  18 20  ?Temp:  99.9 ?F (37.7 ?C) 99.6 ?F (37.6 ?C) 99.1 ?F (37.3 ?C)  ?TempSrc:  Oral  Oral  ?SpO2: 96%  97% 96%  ?Weight:      ?Height:      ? ? ?Intake/Output Summary (Last 24 hours) at 10/08/2021 0738 ?Last  data filed at 10/08/2021 0654 ?Gross per 24 hour  ?Intake 1737.88 ml  ?Output --  ?Net 1737.88 ml  ? ?Filed Weights  ? 10/07/21 2228  ?Weight: 59 kg  ?  ? ?Physical Exam:  ?General: awake, alert, NAD ?HEENT: atraumatic, clear conjunctiva, anicteric sclera, MMM, hearing grossly normal ?Respiratory: normal respiratory effort. ?Cardiovascular: quick capillary refill, normal S1/S2, RRR, no JVD, murmurs ?Gastrointestinal: soft, NT, ND ?Nervous: A&O x3. no gross focal neurologic deficits, normal speech ?Extremities: moves all equally, no edema, normal tone ?Skin: dry, intact, normal temperature, normal color. No rashes, lesions or ulcers on exposed skin ?Psychiatry: normal mood, congruent affect ? ?Labs   ?I have personally reviewed the following labs and imaging studies ?CBC ?   ?Component Value Date/Time  ? WBC 6.8 10/08/2021 0324  ? RBC 3.49 (L) 10/08/2021 0324  ? HGB 10.7 (L) 10/08/2021 0324  ? HGB 15.4 12/01/2015 0829  ? HCT 31.2 (L) 10/08/2021 0324  ? HCT 43.6 12/01/2015 0829  ? PLT 171 10/08/2021 0324  ? PLT 258 12/01/2015 0829  ? MCV 89.4 10/08/2021 0324  ? MCV 85 12/01/2015 0829  ? MCH 30.7 10/08/2021 0324  ? MCHC 34.3 10/08/2021 0324  ? RDW 15.9 (H) 10/08/2021 0324  ? RDW 13.2 12/01/2015 0829  ? LYMPHSABS 0.4 (L) 10/08/2021 0324  ? LYMPHSABS 2.4 12/01/2015 0829  ? MONOABS 0.2 10/08/2021 0324  ? EOSABS 0.0 10/08/2021 0324  ? EOSABS 0.1 12/01/2015 0829  ? BASOSABS 0.0 10/08/2021 0324  ? BASOSABS 0.0 12/01/2015 0829  ? ? ?  Latest Ref Rng & Units 10/08/2021  ?  3:24 AM 10/07/2021  ? 10:46 PM 10/07/2021  ?  8:36 AM  ?BMP  ?Glucose 70 - 99 mg/dL 170   203   170    ?BUN 8 - 23 mg/dL 17   20   28    ?Creatinine 0.44 - 1.00 mg/dL 1.25   1.22   1.26    ?Sodium 135 - 145 mmol/L 141   137   136    ?Potassium 3.5 - 5.1 mmol/L 3.4   2.8   2.9    ?Chloride 98 - 111 mmol/L 113   109   106    ?CO2 22 - 32 mmol/L 22   19   21    ?Calcium 8.9 - 10.3 mg/dL 7.4   8.3   8.7    ? ? ?CT Abdomen Pelvis Wo Contrast ? ?Result Date:  10/08/2021 ?CLINICAL DATA:  Acute abdominal pain, history of invasive breast carcinoma EXAM: CT ABDOMEN AND PELVIS WITHOUT CONTRAST TECHNIQUE: Multidetector CT imaging of the abdomen and pelvis was performed following the standard protocol without IV contrast. RADIATION DOSE REDUCTION: This exam was performed according to the departmental dose-optimization program which includes automated exposure control, adjustment of the mA and/or kV according to patient size and/or use of iterative reconstruction technique. COMPARISON:  08/29/2021 FINDINGS: Lower chest: Lung bases demonstrate increasing atelectasis in the bases bilaterally. No discrete pulmonary nodules are noted. No sizable effusion is seen.   The previously seen inferior left breast mass is again noted but slightly less dense than that seen on the prior study. Hepatobiliary: Biliary drainage catheter is noted extending from the left lobe of the liver through the common bile duct into the duodenum. The degree of central biliary ductal dilatation has improved on the left although some mild residual dilatation is noted on the right. The gallbladder has been surgically removed. Liver demonstrates no discrete lesions although the lack of IV contrast somewhat limits the exam. Pancreas: Unremarkable. No pancreatic ductal dilatation or surrounding inflammatory changes. Spleen: Normal in size without focal abnormality. Adrenals/Urinary Tract: Adrenal glands are within normal limits. Kidneys are well visualized bilaterally. No renal calculi or obstructive changes are seen. The bladder is decompressed. Stomach/Bowel: Appendix is well visualized with inspissated barium. No obstructive or inflammatory changes of the colon are seen. Changes of recent gastrojejunostomy are noted. Persistent fullness in the region of the second portion of the duodenum is noted consistent with the known history of duodenal obstruction. Vascular/Lymphatic: Aortic atherosclerosis. No enlarged  abdominal or pelvic lymph nodes. Reproductive: Uterus and bilateral adnexa are unremarkable. Other: No abdominal wall hernia or abnormality. No abdominopelvic ascites. Musculoskeletal: No acute or significant osseous fin

## 2021-10-08 NOTE — Assessment & Plan Note (Signed)
Will consider ct or cta If procal is negative.  ?

## 2021-10-08 NOTE — Assessment & Plan Note (Signed)
Hold pt's synjardy and start glycemic protocol.  ?

## 2021-10-08 NOTE — Progress Notes (Signed)
CODE SEPSIS - PHARMACY COMMUNICATION ? ?**Broad Spectrum Antibiotics should be administered within 1 hour of Sepsis diagnosis** ? ?Time Code Sepsis Called/Page Received: 4/28 @ 1586 ? ?Antibiotics Ordered: metronidazole,  cefepime  ? ?Time of 1st antibiotic administration:  cefepime 2 gm IV  X 1 on 4/28 @ 2348  ? ?Additional action taken by pharmacy:  ? ?If necessary, Name of Provider/Nurse Contacted:  ? ? ? ?Anah Billard D ,PharmD ?Clinical Pharmacist  ?10/08/2021  12:59 AM ? ?

## 2021-10-09 DIAGNOSIS — K311 Adult hypertrophic pyloric stenosis: Secondary | ICD-10-CM

## 2021-10-09 DIAGNOSIS — N179 Acute kidney failure, unspecified: Secondary | ICD-10-CM | POA: Diagnosis not present

## 2021-10-09 DIAGNOSIS — I10 Essential (primary) hypertension: Secondary | ICD-10-CM

## 2021-10-09 DIAGNOSIS — E039 Hypothyroidism, unspecified: Secondary | ICD-10-CM | POA: Diagnosis not present

## 2021-10-09 DIAGNOSIS — E1165 Type 2 diabetes mellitus with hyperglycemia: Secondary | ICD-10-CM

## 2021-10-09 DIAGNOSIS — J189 Pneumonia, unspecified organism: Secondary | ICD-10-CM | POA: Diagnosis not present

## 2021-10-09 DIAGNOSIS — E876 Hypokalemia: Secondary | ICD-10-CM

## 2021-10-09 DIAGNOSIS — A419 Sepsis, unspecified organism: Secondary | ICD-10-CM | POA: Diagnosis not present

## 2021-10-09 DIAGNOSIS — C50919 Malignant neoplasm of unspecified site of unspecified female breast: Secondary | ICD-10-CM

## 2021-10-09 LAB — COMPREHENSIVE METABOLIC PANEL
ALT: 51 U/L — ABNORMAL HIGH (ref 0–44)
AST: 43 U/L — ABNORMAL HIGH (ref 15–41)
Albumin: 2.6 g/dL — ABNORMAL LOW (ref 3.5–5.0)
Alkaline Phosphatase: 56 U/L (ref 38–126)
Anion gap: 4 — ABNORMAL LOW (ref 5–15)
BUN: 18 mg/dL (ref 8–23)
CO2: 23 mmol/L (ref 22–32)
Calcium: 7.9 mg/dL — ABNORMAL LOW (ref 8.9–10.3)
Chloride: 112 mmol/L — ABNORMAL HIGH (ref 98–111)
Creatinine, Ser: 1.2 mg/dL — ABNORMAL HIGH (ref 0.44–1.00)
GFR, Estimated: 50 mL/min — ABNORMAL LOW (ref >60)
Glucose, Bld: 138 mg/dL — ABNORMAL HIGH (ref 70–99)
Potassium: 3.6 mmol/L (ref 3.5–5.1)
Sodium: 139 mmol/L (ref 135–145)
Total Bilirubin: 2.2 mg/dL — ABNORMAL HIGH (ref 0.3–1.2)
Total Protein: 5.4 g/dL — ABNORMAL LOW (ref 6.5–8.1)

## 2021-10-09 LAB — TSH: TSH: 34.953 u[IU]/mL — ABNORMAL HIGH (ref 0.350–4.500)

## 2021-10-09 MED ORDER — METOCLOPRAMIDE HCL 5 MG PO TABS
5.0000 mg | ORAL_TABLET | Freq: Three times a day (TID) | ORAL | Status: DC | PRN
  Administered 2021-10-09 – 2021-10-10 (×2): 5 mg via ORAL
  Filled 2021-10-09 (×3): qty 1

## 2021-10-09 MED ORDER — POTASSIUM CHLORIDE CRYS ER 20 MEQ PO TBCR
40.0000 meq | EXTENDED_RELEASE_TABLET | Freq: Two times a day (BID) | ORAL | Status: AC
  Administered 2021-10-09 (×2): 40 meq via ORAL
  Filled 2021-10-09 (×2): qty 2

## 2021-10-09 NOTE — Progress Notes (Addendum)
?  IR is aware of biliary drain issues. ? ?Will evaluate and consider exchange in IR on Monday 5/1. ? ?I will make patient NPO after MN and hold heparin in anticipation of possible exchange. ? ?Murrell Redden PA-C ?10/09/2021 ?8:43 AM ? ? ? ? ?

## 2021-10-09 NOTE — Progress Notes (Addendum)
?PROGRESS NOTE ? ?Diamond Hinton    DOB: August 07, 1955, 66 y.o.  ?JQB:[id number]  ?  Code Status: Full Code   ?DOA: 10/07/2021   LOS: 1  ? ?Brief hospital course  ?Diamond Hinton is a 66 y.o. female with a PMH significant for HTN, GERD, Breast cancer, and Gallstones. ? ?They presented from home to the ED on 10/07/2021 with nausea and vomiting x several days. She is regularly symptomatic in setting of her metastatic breast cancer causing duodenal outlet obstruction and treatments. She receives regular IV infusions with potassium supplements at oncology clinic and last appointment was 4/28. ?She is s/p external biliary drain with GJ tube ? ?In the ED, it was found that they had dehydration and hypokalemia.  ?Significant findings included Magnesium 1.1, K+ 3.4, Cr 1.25, AST/ALT 56/48, total bilirubin 1.9, LA 3.0. CT abdomen did not show significant obstruction of bowel or biliary tree. Chest xray showed signs of bilateral lower lobe PNA. ?Vital signs were stable on admission with exception of mild fever of 100.5 ? ?They were treated with IV fluids, potassium supplementation, zofran, PPI.  ?Patient was admitted to medicine service for further workup and management of sepsis as outlined in detail below. ? ?10/09/21 -stable, improved ? ?Assessment & Plan  ?Principal Problem: ?  Fever ?Active Problems: ?  Severe sepsis ([place]) ?  Pneumonia ?  Uncontrolled type 2 diabetes mellitus with hyperglycemia, without long-term current use of insulin ([place]) ?  AKI (acute kidney injury) ([place]) ?  Adult hypothyroidism ?  Hypertension ?  Reflux gastritis ?  Gastric outlet obstruction ?  Primary malignant neoplasm of breast with metastasis ([place]) ?  Hypokalemia ? ?Severe Sepsis  PNA- patient met sepsis criteria with fever, elevated LA, tachycardia, and tachypnea and source suspected to be lungs with signs of pneumonia on chest xray and possibly intra-abdominal.  ?Sepsis criteria have resolved. ?She remains stable ORA and denies respiratory  complaints.  ?- continue IV Abx and can transition to PO tomorrow ?- follow up BxCx NGTD ?- monitor fever curve ?- analgesia and antipyretics PRN ?- CBC am ?- incentive spirometer/flutter valve ? 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 Janese Banks, last appointment 4/11 with 4 week follow up plan. Current therapy: ribociclib and letrozole. S/p palliative gastrojejunostomy. Has biliary drain in place which had increased discomfort yesterday, patient flushed twice. General surgery evaluated, appreciate Dr. Christian Mate input. IR to evaluate on 5/1.  ?- monitor liver function ?- ECG ?- Dr Janese Banks will follow up outpatient. ? ?Prolonged Qtc- 490 ?- avoid prolonging agents. Discontinued zofran ? ?Refractory N/V  dehydration  physical deconditioning- related to above. Improving. Patient able to tolerate diet yesterday.  ?- antiemetics PRN ?- daily CMP to monitor electrolytes/kidney function ?- PT/OT ? ?Hypokalemia  hypomagnesemia - K+ 2.8>3.4>3.6 s/p replacement. Mg++ 1.1 ?- replete K+ and Mg++ ?- BMP am ? ?Type II DM- recent A1c 6.9.  ?- holding home oral medications currently for possible contribution to N/V ?- monitor blood sugars with regular labs. Can add on sliding scale if needed ? ?Hypothyroidism- last TSH 11/22 was normal.  ?- continue home therapy ?- repeat TSH this admission to confirm that it is not contributing to her current symptoms ? ?HTN- normotensive this admission without any home therapy ?- vitals per floor protocol ? ? ?Body mass index is 23.03 kg/m?. ? ?VTE ppx: heparin injection 5,000 Units Start: 10/08/21 0600 ? ? ?Diet:  ?   ?  Diet  ? Diet Carb Modified Fluid consistency: Thin; Room service appropriate? Yes  ? ?Consultants: ?none ?Subjective 10/09/21   ? ?Pt reports doing much better. Endorses improved abdominal pain. Able to tolerate diet yesterday. Having significant amount of gas. Drainage  continues from her biliary drain in colostomy bag ?  ?Objective  ? ?Vitals:  ? 10/08/21 0814 10/08/21 1545 10/08/21 1957 10/09/21 0515  ?BP: 121/69 122/76 133/64 132/75  ?Pulse: 79 81 83 74  ?Resp: _0 ?Temp: 99.2 ?F (37.3 ?C) 98.6 ?F (37 ?C) 98.2 ?F (36.8 ?C) 98.1 ?F (36.7 ?C)  ?TempSrc:  Oral    ?SpO2: 99% 97% 97% 97%  ?Weight:      ?Height:      ? ? ?Intake/Output Summary (Last 24 hours) at 10/09/2021 0743 ?Last data filed at 10/09/2021 0620 ?Gross per 24 hour  ?Intake 1605.15 ml  ?Output 670 ml  ?Net 935.15 ml  ? ? ?Filed Weights  ? 10/07/21 2228  ?Weight: 59 kg  ?  ? ?Physical Exam:  ?General: awake, alert, NAD ?HEENT: atraumatic, clear conjunctiva, anicteric sclera, MMM, hearing grossly normal ?Respiratory: normal respiratory effort. ?Cardiovascular: quick capillary refill, normal S1/S2, RRR, no JVD, murmurs ?Gastrointestinal: soft, NT, ND. Positive for bile in collection bag. No blood. Skin looks intact and non-irritated at incision site ?Nervous: A&O x3. no gross focal neurologic deficits, normal speech ?Extremities: moves all equally, no edema, normal tone ?Skin: dry, intact, normal temperature, normal color. No rashes, lesions or ulcers on exposed skin ?Psychiatry: normal mood, congruent affect ? ?Labs   ?I have personally reviewed the following labs and imaging studies ?CBC ?   ?Component Value Date/Time  ? WBC 6.8 10/08/2021 0324  ? RBC 3.49 (L) 10/08/2021 0324  ? HGB 10.7 (L) 10/08/2021 0324  ? HGB 15.4 12/01/2015 0829  ? HCT 31.2 (L) 10/08/2021 0324  ? HCT 43.6 12/01/2015 0829  ? PLT 171 10/08/2021 0324  ? PLT 258 12/01/2015 0829  ? MCV 89.4 10/08/2021 0324  ? MCV 85 12/01/2015 0829  ? MCH 30.7 10/08/2021 0324  ? MCHC 34.3 10/08/2021 0324  ? RDW 15.9 (H) 10/08/2021 0324  ? RDW 13.2 12/01/2015 0829  ? LYMPHSABS 0.4 (L) 10/08/2021 0324  ? LYMPHSABS 2.4 12/01/2015 0829  ? MONOABS 0.2 10/08/2021 0324  ? EOSABS 0.0 10/08/2021 0324  ? EOSABS 0.1 12/01/2015 0829  ? BASOSABS 0.0 10/08/2021 0324  ?  BASOSABS 0.0 12/01/2015 0829  ? ? ?  Latest Ref Rng & Units 10/09/2021  ?  5:11 AM 10/08/2021  ?  3:24 AM 10/07/2021  ? 10:46 PM  ?BMP  ?Glucose 70 - 99 mg/dL 138   170   203    ?BUN 8 - 23 mg/dL _1 ?Creatinine 0.44 - 1.00 mg/dL 1.20   1.25   1.22    ?Sodium 135 - 145 mmol/L 139   141   137    ?Potassium 3.5 - 5.1 mmol/L 3.6   3.4   2.8    ?Chloride 98 - 111 mmol/L 112   113   109    ?CO2 22 - 32 mmol/L _2 ?Calcium 8.9 - 10.3 mg/dL 7.9   7.4   8.3    ? ? ?CT Abdomen Pelvis Wo Contrast ? ?Result Date: 10/08/2021 ?CLINICAL DATA:  Acute abdominal pain, history of invasive breast carcinoma EXAM: CT ABDOMEN AND PELVIS WITHOUT CONTRAST TECHNIQUE: Multidetector  CT imaging of the abdomen and pelvis was performed following the standard protocol without IV contrast. RADIATION DOSE REDUCTION: This exam was performed according to the departmental dose-optimization program which includes automated exposure control, adjustment of the mA and/or kV according to patient size and/or use of iterative reconstruction technique. COMPARISON:  08/29/2021 FINDINGS: Lower chest: Lung bases demonstrate increasing atelectasis in the bases bilaterally. No discrete pulmonary nodules are noted. No sizable effusion is seen. The previously seen inferior left breast mass is again noted but slightly less dense than that seen on the prior study. Hepatobiliary: Biliary drainage catheter is noted extending from the left lobe of the liver through the common bile duct into the duodenum. The degree of central biliary ductal dilatation has improved on the left although some mild residual dilatation is noted on the right. The gallbladder has been surgically removed. Liver demonstrates no discrete lesions although the lack of IV contrast somewhat limits the exam. Pancreas: Unremarkable. No pancreatic ductal dilatation or surrounding inflammatory changes. Spleen: Normal in size without focal abnormality. Adrenals/Urinary Tract: Adrenal  glands are within normal limits. Kidneys are well visualized bilaterally. No renal calculi or obstructive changes are seen. The bladder is decompressed. Stomach/Bowel: Appendix is well visualized with inspissated barium. No ob

## 2021-10-10 ENCOUNTER — Inpatient Hospital Stay: Payer: Medicare HMO | Admitting: Radiology

## 2021-10-10 ENCOUNTER — Encounter: Payer: Self-pay | Admitting: Internal Medicine

## 2021-10-10 ENCOUNTER — Inpatient Hospital Stay: Payer: Medicare HMO

## 2021-10-10 ENCOUNTER — Inpatient Hospital Stay: Payer: Medicare HMO | Admitting: Nurse Practitioner

## 2021-10-10 DIAGNOSIS — N179 Acute kidney failure, unspecified: Secondary | ICD-10-CM | POA: Diagnosis not present

## 2021-10-10 DIAGNOSIS — J189 Pneumonia, unspecified organism: Secondary | ICD-10-CM | POA: Diagnosis not present

## 2021-10-10 DIAGNOSIS — E039 Hypothyroidism, unspecified: Secondary | ICD-10-CM | POA: Diagnosis not present

## 2021-10-10 DIAGNOSIS — K2960 Other gastritis without bleeding: Secondary | ICD-10-CM

## 2021-10-10 DIAGNOSIS — R509 Fever, unspecified: Secondary | ICD-10-CM | POA: Diagnosis not present

## 2021-10-10 DIAGNOSIS — A419 Sepsis, unspecified organism: Secondary | ICD-10-CM | POA: Diagnosis not present

## 2021-10-10 DIAGNOSIS — K831 Obstruction of bile duct: Secondary | ICD-10-CM | POA: Diagnosis not present

## 2021-10-10 HISTORY — PX: IR EXCHANGE BILIARY DRAIN: IMG6046

## 2021-10-10 LAB — URINALYSIS, ROUTINE W REFLEX MICROSCOPIC
Bilirubin Urine: NEGATIVE
Glucose, UA: NEGATIVE mg/dL
Hgb urine dipstick: NEGATIVE
Ketones, ur: NEGATIVE mg/dL
Leukocytes,Ua: NEGATIVE
Nitrite: NEGATIVE
Protein, ur: NEGATIVE mg/dL
Specific Gravity, Urine: 1.016 (ref 1.005–1.030)
pH: 5 (ref 5.0–8.0)

## 2021-10-10 LAB — COMPREHENSIVE METABOLIC PANEL
ALT: 55 U/L — ABNORMAL HIGH (ref 0–44)
AST: 42 U/L — ABNORMAL HIGH (ref 15–41)
Albumin: 2.7 g/dL — ABNORMAL LOW (ref 3.5–5.0)
Alkaline Phosphatase: 68 U/L (ref 38–126)
Anion gap: 5 (ref 5–15)
BUN: 17 mg/dL (ref 8–23)
CO2: 22 mmol/L (ref 22–32)
Calcium: 8.3 mg/dL — ABNORMAL LOW (ref 8.9–10.3)
Chloride: 111 mmol/L (ref 98–111)
Creatinine, Ser: 1.24 mg/dL — ABNORMAL HIGH (ref 0.44–1.00)
GFR, Estimated: 48 mL/min — ABNORMAL LOW (ref >60)
Glucose, Bld: 138 mg/dL — ABNORMAL HIGH (ref 70–99)
Potassium: 4.4 mmol/L (ref 3.5–5.1)
Sodium: 138 mmol/L (ref 135–145)
Total Bilirubin: 2.9 mg/dL — ABNORMAL HIGH (ref 0.3–1.2)
Total Protein: 5.6 g/dL — ABNORMAL LOW (ref 6.5–8.1)

## 2021-10-10 MED ORDER — FENTANYL CITRATE (PF) 100 MCG/2ML IJ SOLN
INTRAMUSCULAR | Status: AC
  Filled 2021-10-10: qty 2

## 2021-10-10 MED ORDER — CEPHALEXIN 500 MG PO CAPS: 500.0000 mg | ORAL_CAPSULE | Freq: Two times a day (BID) | ORAL | 0 refills | Status: AC

## 2021-10-10 MED ORDER — MIDAZOLAM HCL 2 MG/2ML IJ SOLN
INTRAMUSCULAR | Status: AC | PRN
  Administered 2021-10-10: .5 mg via INTRAVENOUS
  Administered 2021-10-10: 1 mg via INTRAVENOUS

## 2021-10-10 MED ORDER — LIDOCAINE HCL 1 % IJ SOLN
INTRAMUSCULAR | Status: AC
  Administered 2021-10-10: 1 mL
  Filled 2021-10-10: qty 20

## 2021-10-10 MED ORDER — MIDAZOLAM HCL 2 MG/2ML IJ SOLN
INTRAMUSCULAR | Status: AC
  Filled 2021-10-10: qty 2

## 2021-10-10 MED ORDER — MIDAZOLAM HCL 5 MG/5ML IJ SOLN
INTRAMUSCULAR | Status: AC | PRN
  Administered 2021-10-10: .5 mg via INTRAVENOUS

## 2021-10-10 MED ORDER — IOHEXOL 350 MG/ML SOLN
7.0000 mL | Freq: Once | INTRAVENOUS | Status: AC | PRN
  Administered 2021-10-10: 7 mL

## 2021-10-10 MED ORDER — FENTANYL CITRATE (PF) 100 MCG/2ML IJ SOLN
INTRAMUSCULAR | Status: AC | PRN
  Administered 2021-10-10 (×2): 50 ug via INTRAVENOUS

## 2021-10-10 NOTE — H&P (Signed)
Chief Complaint: Patient was seen in consultation today for biliary drain exchange Referring Physician(s): Dr. Bryn Gulling  Supervising Physician: Mir, Mauri Reading  Patient Status: [facility] - In-pt  History of Present Illness: Diamond Hinton is a 66 y.o. female with past medical history significant for DM, HTN, gallstone status post prior cholecystostomy, GERD.  Patient had pigtail drain placed in CBD with Dr. Bryn Gulling 08/31/2021 that was unable to cross sphincter of Oddi.  Patient was advised to return 2 days later for an attempt to cross the sphincter.  Patient returned 09/02/2021 to IR and had biliary drain placed at the distal CBD/duodenal obstruction with Dr. Miles Costain.  Patient has experienced leakage around the drain.  Patient returning to IR today to have biliary drain exchange/upsized with Dr. Bryn Gulling.  Past Medical History:  Diagnosis Date   Anemia    Anxiety    Breast cancer (HCC)    Gallstones    GERD (gastroesophageal reflux disease)    Hyperlipidemia    Hypertension    Jaundice 08/29/2021   Rash 08/29/2021   Right upper quadrant abdominal pain 08/29/2021   UTI (urinary tract infection) 08/29/2021    Past Surgical History:  Procedure Laterality Date   BILATERAL SALPINGECTOMY  04/03/1997   CHOLECYSTECTOMY N/A 07/07/2015   Procedure: LAPAROSCOPIC CHOLECYSTECTOMY ;  Surgeon: Leafy Ro, MD;  Location: [facility] ORS;  Service: General;  Laterality: N/A;   ESOPHAGOGASTRODUODENOSCOPY  08/30/2021   Procedure: ESOPHAGOGASTRODUODENOSCOPY (EGD);  Surgeon: Midge Minium, MD;  Location[facility] ENDOSCOPY;  Service: Endoscopy;;   GALLBLADDER SURGERY  07/07/2015   [facility]   IR BILIARY DRAIN PLACEMENT WITH CHOLANGIOGRAM  08/31/2021   IR CONVERT BILIARY DRAIN TO INT EXT BILIARY DRAIN  09/02/2021    Allergies: Peanuts [peanut oil]  Medications: Prior to Admission medications   Medication Sig Start Date End Date Taking? Authorizing Provider  letrozole (FEMARA) 2.5 MG tablet Take 1 tablet (2.5 mg total) by mouth  daily. 09/20/21  Yes Creig Hines, MD  levothyroxine (SYNTHROID) 75 MCG tablet Take 1 tablet (75 mcg total) by mouth daily. 10/12/20  Yes Bacigalupo, Marzella Schlein, MD  pantoprazole (PROTONIX) 40 MG tablet Take 1 tablet (40 mg total) by mouth daily. 09/14/21 09/14/22 Yes Hollice Espy, MD  potassium chloride (KLOR-CON M) 10 MEQ tablet Take 2 tablets (20 mEq total) by mouth daily. 10/05/21  Yes Covington, Sarah M, PA-C  acetaminophen (TYLENOL) 325 MG tablet Take 325 mg by mouth every 6 (six) hours as needed (for pain and sometimes takes 2 if pain is worse).    [provider]  feeding supplement (ENSURE ENLIVE / ENSURE PLUS) LIQD Take 237 mLs by mouth 3 (three) times daily between meals. 09/14/21   Hollice Espy, MD  loratadine (CLARITIN) 10 MG tablet Take 10 mg by mouth daily as needed for allergies. Patient not taking: Reported on 10/04/2021    [provider]  Multiple Vitamin (MULTIVITAMIN WITH MINERALS) TABS tablet Take 1 tablet by mouth daily. Patient not taking: Reported on 10/06/2021 09/15/21   Hollice Espy, MD  ondansetron (ZOFRAN) 4 MG tablet Take 1 tablet (4 mg total) by mouth daily as needed for nausea or vomiting. 09/14/21 09/14/22  Hollice Espy, MD  oxyCODONE-acetaminophen (PERCOCET) 5-325 MG tablet Take 1 tablet by mouth every 4 (four) hours as needed for severe pain. Patient not taking: Reported on 10/04/2021 09/14/21 09/14/22  Hollice Espy, MD  polyethylene glycol (MIRALAX / GLYCOLAX) 17 g packet Take 17 g by mouth daily as needed.  Patient not taking: Reported on 10/04/2021    [provider]  prochlorperazine (COMPAZINE) 5 MG tablet Take 1 tablet (5 mg total) by mouth every 6 (six) hours as needed for refractory nausea / vomiting. 09/14/21   Hollice Espy, MD  promethazine (PHENERGAN) 12.5 MG tablet Take 1 tablet (12.5 mg total) by mouth every 6 (six) hours as needed for nausea or vomiting. Patient not taking: Reported on 10/06/2021 09/14/21   Hollice Espy, MD  ribociclib succ (KISQALI, 600 MG DOSE,) 200 MG Therapy Pack Take 3 tablets (600 mg total) by mouth daily. Take for 21 days on, 7 days off, repeat every 28 days. Patient not taking: Reported on 10/05/2021 09/15/21   Creig Hines, MD  scopolamine (TRANSDERM-SCOP) 1 MG/3DAYS Place 1 patch (1.5 mg total) onto the skin every 3 (three) days. Patient not taking: Reported on 10/04/2021 09/16/21   Hollice Espy, MD  sodium chloride 0.9 % injection Inject 3 mLs into the vein as needed. Patient not taking: Reported on 10/06/2021 09/28/21   Campbell Lerner, MD  traZODone (DESYREL) 50 MG tablet Take 1 tablet (50 mg total) by mouth at bedtime as needed for sleep. 10/07/21   Rushie Chestnut, PA-C     Family History  Problem Relation Age of Onset   Hypertension Mother    CVA Mother    Ulcers Mother    Emphysema Mother    Heart disease Father    Prostate cancer Father    Macular degeneration Father    Hypertension Sister    Diabetes Sister    Cervical polyp Sister    Hypertension Sister    Diabetes Sister    Lymphoma Sister     Social History   Socioeconomic History   Marital status: Married    Spouse name: Not on file   Number of children: Not on file   Years of education: Not on file   Highest education level: Not on file  Occupational History   Not on file  Tobacco Use   Smoking status: Never   Smokeless tobacco: Never  Vaping Use   Vaping Use: Never used  Substance and Sexual Activity   Alcohol use: No   Drug use: No   Sexual activity: Yes  Other Topics Concern   Not on file  Social History Narrative   Not on file   Social Determinants of Health   Financial Resource Strain: Not on file  Food Insecurity: Not on file  Transportation Needs: Not on file  Physical Activity: Not on file  Stress: Not on file  Social Connections: Not on file    Review of Systems: A 12 point ROS discussed and pertinent positives are indicated in the HPI above.  All other systems  are negative.  Review of Systems  Constitutional:  Positive for fever. Negative for chills.  Respiratory:  Positive for shortness of breath. Negative for cough.   Cardiovascular:  Positive for leg swelling. Negative for chest pain.  Gastrointestinal:  Positive for abdominal distention. Negative for nausea and vomiting.  Neurological:  Negative for headaches.   Vital Signs: BP (!) 156/92 (BP Location: Right Arm) Comment: reported this value to the nurse  Pulse 77   Temp 99.3 F (37.4 C) (Oral)   Resp 16   Ht 5\' 3"  (1.6 m)   Wt 130 lb (59 kg)   SpO2 98%   BMI 23.03 kg/m   Physical Exam Constitutional:      General: She is not  in acute distress.    Appearance: Normal appearance. She is not ill-appearing.  HENT:     Head: Normocephalic and atraumatic.     Mouth/Throat:     Mouth: Mucous membranes are dry.     Pharynx: Oropharynx is clear.  Eyes:     Extraocular Movements: Extraocular movements intact.     Pupils: Pupils are equal, round, and reactive to light.  Cardiovascular:     Rate and Rhythm: Normal rate and regular rhythm.     Pulses: Normal pulses.     Heart sounds: Normal heart sounds.  Pulmonary:     Effort: Pulmonary effort is normal. No respiratory distress.  Abdominal:     General: Bowel sounds are normal. There is no distension.     Palpations: Abdomen is soft.     Tenderness: There is no abdominal tenderness. There is no guarding.     Comments: Biliary drain intact. Colostomy bag placed over drain d/t leaking.   Skin:    General: Skin is warm and dry.  Neurological:     Mental Status: She is alert and oriented to person, place, and time.  Psychiatric:        Mood and Affect: Mood normal.        Behavior: Behavior normal.        Thought Content: Thought content normal.        Judgment: Judgment normal.    Imaging: CT Abdomen Pelvis Wo Contrast  Result Date: 10/08/2021 CLINICAL DATA:  Acute abdominal pain, history of invasive breast carcinoma EXAM: CT  ABDOMEN AND PELVIS WITHOUT CONTRAST TECHNIQUE: Multidetector CT imaging of the abdomen and pelvis was performed following the standard protocol without IV contrast. RADIATION DOSE REDUCTION: This exam was performed according to the departmental dose-optimization program which includes automated exposure control, adjustment of the mA and/or kV according to patient size and/or use of iterative reconstruction technique. COMPARISON:  08/29/2021 FINDINGS: Lower chest: Lung bases demonstrate increasing atelectasis in the bases bilaterally. No discrete pulmonary nodules are noted. No sizable effusion is seen. The previously seen inferior left breast mass is again noted but slightly less dense than that seen on the prior study. Hepatobiliary: Biliary drainage catheter is noted extending from the left lobe of the liver through the common bile duct into the duodenum. The degree of central biliary ductal dilatation has improved on the left although some mild residual dilatation is noted on the right. The gallbladder has been surgically removed. Liver demonstrates no discrete lesions although the lack of IV contrast somewhat limits the exam. Pancreas: Unremarkable. No pancreatic ductal dilatation or surrounding inflammatory changes. Spleen: Normal in size without focal abnormality. Adrenals/Urinary Tract: Adrenal glands are within normal limits. Kidneys are well visualized bilaterally. No renal calculi or obstructive changes are seen. The bladder is decompressed. Stomach/Bowel: Appendix is well visualized with inspissated barium. No obstructive or inflammatory changes of the colon are seen. Changes of recent gastrojejunostomy are noted. Persistent fullness in the region of the second portion of the duodenum is noted consistent with the known history of duodenal obstruction. Vascular/Lymphatic: Aortic atherosclerosis. No enlarged abdominal or pelvic lymph nodes. Reproductive: Uterus and bilateral adnexa are unremarkable. Other:  No abdominal wall hernia or abnormality. No abdominopelvic ascites. Musculoskeletal: No acute or significant osseous findings. IMPRESSION: Changes consistent with recent gastrojejunostomy. Changes of interval internal/external biliary drainage with significant decompression of the left biliary tree. Some mild residual right biliary dilatation is noted although incompletely evaluated on this exam. Stable fullness in the second portion  of the duodenum related to the known metastatic disease. Left inferior breast mass slightly less prominent than that noted on the prior exam. Electronically Signed   By: Alcide Clever M.D.   On: 10/08/2021 00:12   DG Chest 2 View  Result Date: 10/07/2021 CLINICAL DATA:  Feeling poor, abdominal pain EXAM: CHEST - 2 VIEW COMPARISON:  None. FINDINGS: Patchy bilateral lower lobe opacities, suspicious for pneumonia, less likely atelectasis. No pleural effusion or pneumothorax. The heart is normal in size.  Thoracic aortic atherosclerosis. Mild degenerative changes of the visualized thoracolumbar spine. IMPRESSION: Patchy bilateral lower lobe opacities, suspicious for pneumonia, less likely atelectasis. Electronically Signed   By: Charline Bills M.D.   On: 10/07/2021 23:48   DG UGI W SINGLE CM (SOL OR THIN BA)  Addendum Date: 09/12/2021   ADDENDUM REPORT: 09/12/2021 12:37 ADDENDUM: The fluoroscopic imaging was acquired by Pattricia Boss, PA-C. Electronically Signed   By: Jackey Loge D.O.   On: 09/12/2021 12:37   Result Date: 09/12/2021 CLINICAL DATA:  Provided history: Status post bypass gastrojejunostomy on 09/08/2021. EXAM: UPPER GI SERIES WITH KUB TECHNIQUE: After obtaining a scout radiograph, a problem oriented upper GI series was performed to assess for leak or obstruction using water-soluble contrast. FLUOROSCOPY: Fluoroscopy Time:  3 minutes, 30 seconds. Radiation Exposure Index (if provided by the fluoroscopic device): 82.2 mGy Number of Acquired Spot Images: 4 COMPARISON:   Abdominal radiograph 09/05/2021. Upper GI series 09/05/2021. FINDINGS: A scout radiograph of the abdomen was obtained. Unchanged position of a gastrostomy tube. No dilated loops of small bowel to suggest small bowel obstruction. Residual contrast within the colon. There is prompt passage of contrast from the distal esophagus into the stomach. Unremarkable appearance of the distal esophagus and GE junction. Unremarkable appearance of the proximal and mid stomach. Prompt passage of contrast from the distal stomach into the proximal jejunum. No evidence of significant obstruction at the gastrojejunostomy site. No extraluminal contrast is demonstrated to suggest leak. Contrast also opacifies the proximal duodenum. IMPRESSION: No evidence of leak status post bypass gastrojejunostomy. No evidence of obstruction at the gastrojejunostomy site or within the proximal jejunum. Contrast opacifies only the proximal duodenum, and the duodenum likely remains obstructed. Electronically Signed: By: Jackey Loge D.O. On: 09/12/2021 11:27    Labs:  CBC: Recent Labs    10/05/21 1056 10/06/21 1003 10/07/21 2246 10/08/21 0324  WBC 6.9 6.2 6.8 6.8  HGB 14.4 13.6 12.6 10.7*  HCT 40.3 38.5 36.4 31.2*  PLT 331 269 289 171    COAGS: Recent Labs    08/31/21 0859 10/07/21 2246  INR 1.1 1.2    BMP: Recent Labs    10/07/21 2246 10/08/21 0324 10/09/21 0511 10/10/21 0557  NA 137 141 139 138  K 2.8* 3.4* 3.6 4.4  CL 109 113* 112* 111  CO2 19* 22 23 22   GLUCOSE 203* 170* 138* 138*  BUN 20 17 18 17   CALCIUM 8.3* 7.4* 7.9* 8.3*  CREATININE 1.22* 1.25* 1.20* 1.24*  GFRNONAA 49* 47* 50* 48*    LIVER FUNCTION TESTS: Recent Labs    10/07/21 2246 10/08/21 0324 10/09/21 0511 10/10/21 0557  BILITOT 2.2* 1.9* 2.2* 2.9*  AST 53* 56* 43* 42*  ALT 50* 48* 51* 55*  ALKPHOS 71 55 56 68  PROT 6.4* 5.0* 5.4* 5.6*  ALBUMIN 3.4* 2.7* 2.6* 2.7*    TUMOR MARKERS: No results for input(s): AFPTM, CEA, CA199,  CHROMGRNA in the last 8760 hours.  Assessment and Plan: History of DM,  HTN, gallstone status post prior cholecystostomy, GERD.  Patient had pigtail drain placed in CBD with Dr. Bryn Gulling 08/31/2021 that was unable to cross sphincter of Oddi.  Patient was advised to return 2 days later for an attempt to cross the sphincter.  Patient returned 09/02/2021 to IR and had biliary drain placed at the distal CBD/duodenal obstruction with Dr. Miles Costain.  Patient has experienced leakage around the drain.  Patient returning to IR today to have biliary drain exchange/upsized with Dr. Bryn Gulling.  Patient resting in bed.  She is alert and oriented, calm and pleasant.  She is in no distress. Patient reports she is NPO per order.  Risks and benefits of biliary drain exchange discussed with the patient including bleeding, infection, damage to adjacent structures, bowel perforation/fistula connection, and sepsis.  All of the patient's questions were answered, patient is agreeable to proceed. Consent signed and in chart.   Thank you for this interesting consult.  I greatly enjoyed meeting Diamond Hinton and look forward to participating in their care.  A copy of this report was sent to the requesting provider on this date.  Electronically Signed: Shon Hough, NP 10/10/2021, 8:48 AM   I spent a total of 20 minutes in face to face in clinical consultation, greater than 50% of which was counseling/coordinating care for biliary drain exchange.

## 2021-10-10 NOTE — Progress Notes (Signed)
Patient clinically stable post Biliary tube exchange per Dr Dwaine Gale, tolerated well with Versed 2 mg along with Fentanyl 100 mcg IV given for procedure. Awake/alert and oriented post procedure. Report given to Care nurse 120 post procedure/bedside. Denies complaints presently. ?

## 2021-10-10 NOTE — Discharge Instructions (Addendum)
Please continue to take your antibiotics until they are completed to treat your pneumonia.  ?Your potassium is within normal limits today. ?Please follow up with your primary doctor or oncologist to recheck your potassium within the next week.  ?I also recommend following up with your primary doctor to check your blood pressure as it was a little elevated in the hospital which can be caused with stress/anxiety, and acute illness being in the hospital. I kept all of your medications the same for now.  ?

## 2021-10-10 NOTE — Discharge Summary (Signed)
? ? ?Physician Discharge Summary  ?Patient: Diamond Hinton DOB: May 27, 1956   Code Status: Full Code ?Admit date: 10/07/2021 ?Discharge date: 10/10/2021 ?Disposition: Home, No home health services recommended ?PCP: Virginia Crews, MD ? ?Recommendations for Outpatient Follow-up:  ?Follow up with PCP within 1-2 weeks  ?Monitor metabolic panel ?Blood pressure monitoring. Hypertensive while inpatient on home therapies in setting of acute illness/anxiety ?Follow up with oncology ?Routine treatment ? ?Discharge Diagnoses:  ?Principal Problem: ?  Fever ?Active Problems: ?  Severe sepsis ([place]) ?  Pneumonia ?  Uncontrolled type 2 diabetes mellitus with hyperglycemia, without long-term current use of insulin ([place]) ?  AKI (acute kidney injury) ([place]) ?  Adult hypothyroidism ?  Hypertension ?  Reflux gastritis ?  Gastric outlet obstruction ?  Primary malignant neoplasm of breast with metastasis ([place]) ?  Hypokalemia ?  Community acquired pneumonia ?  Sepsis ([place]) ? ?Brief Hospital Course Summary: ?[name] is a 66 y.o. female with a PMH significant for HTN, GERD, Breast cancer, and Gallstones. ?  ?They presented from home to the ED on 10/07/2021 with nausea and vomiting x several days not amenable to home therapies. She is regularly symptomatic in setting of her metastatic breast cancer causing duodenal outlet obstruction and treatments. She receives regular IV infusions with potassium supplements at oncology clinic and last appointment was 4/28. ?She is s/p external biliary drain s/p gastrojejunostomy.  ?  ?In the ED, it was found that they had dehydration and hypokalemia. She met sepsis criteria with fever 100.5, elevated LA, tachycardia, and tachypnea with lungs or intraabdominal source being suspected. ? ?Significant findings included Magnesium 1.1, K+ 3.4, Cr 1.25, AST/ALT 56/48, total bilirubin 1.9, LA 3.0.  ?CT abdomen did not show significant obstruction of bowel or biliary tree.  ?Chest xray showed  signs of bilateral lower lobe PNA. ?  ?They were initially treated with IV fluids, Abx, potassium supplementation, zofran, PPI.  ? ?She responded well to treatment and sepsis criteria resolved. With supportive care, and electrolyte supplementation, she was able to tolerate a normal diet and feel overall well on day of discharge.  ? ?5/1- she underwent revision of her biliary drain via IR and tolerated well.  ? ?Discharge Condition: Good, improved ?Recommended discharge diet: Regular healthy diet ? ?Consultations: ?IR ? ?Procedures/Studies: ?Biliary drain replacement  ? ? ?Allergies as of 10/10/2021   ? ?   Reactions  ? Peanuts [peanut Oil] Shortness Of Breath, Itching  ? ?  ? ?  ?Medication List  ?  ? ?STOP taking these medications   ? ?Kisqali (600 MG Dose) 200 MG Therapy Pack ?Generic drug: ribociclib succ ?  ?loratadine 10 MG tablet ?Commonly known as: CLARITIN ?  ?oxyCODONE-acetaminophen 5-325 MG tablet ?Commonly known as: Percocet ?  ?polyethylene glycol 17 g packet ?Commonly known as: MIRALAX / GLYCOLAX ?  ?promethazine 12.5 MG tablet ?Commonly known as: PHENERGAN ?  ?scopolamine 1 MG/3DAYS ?Commonly known as: TRANSDERM-SCOP ?  ?sodium chloride 0.9 % injection ?  ? ?  ? ?TAKE these medications   ? ?acetaminophen 325 MG tablet ?Commonly known as: TYLENOL ?Take 325 mg by mouth every 6 (six) hours as needed (for pain and sometimes takes 2 if pain is worse). ?  ?cephALEXin 500 MG capsule ?Commonly known as: KEFLEX ?Take 1 capsule (500 mg total) by mouth 2 (two) times daily for 10 days. ?  ?feeding supplement Liqd ?Take 237 mLs by mouth 3 (three) times daily between meals. ?  ?letrozole 2.5 MG  tablet ?Commonly known as: [place] ?Take 1 tablet (2.5 mg total) by mouth daily. ?  ?levothyroxine 75 MCG tablet ?Commonly known as: SYNTHROID ?Take 1 tablet (75 mcg total) by mouth daily. ?  ?multivitamin with minerals Tabs tablet ?Take 1 tablet by mouth daily. ?  ?ondansetron 4 MG tablet ?Commonly known as: Zofran ?Take 1  tablet (4 mg total) by mouth daily as needed for nausea or vomiting. ?  ?pantoprazole 40 MG tablet ?Commonly known as: Protonix ?Take 1 tablet (40 mg total) by mouth daily. ?  ?potassium chloride 10 MEQ tablet ?Commonly known as: KLOR-CON M ?Take 2 tablets (20 mEq total) by mouth daily. ?  ?prochlorperazine 5 MG tablet ?Commonly known as: COMPAZINE ?Take 1 tablet (5 mg total) by mouth every 6 (six) hours as needed for refractory nausea / vomiting. ?  ?traZODone 50 MG tablet ?Commonly known as: DESYREL ?Take 1 tablet (50 mg total) by mouth at bedtime as needed for sleep. ?  ? ?  ? ? ? ?Subjective   ?Pt reports feeling much improved. She is eating well without nausea or abdominal pain. She is nervous about how high her blood pressure has been today.  ?Objective  ?Blood pressure (!) 159/90, pulse 71, temperature 98.7 ?F (37.1 ?C), resp. rate 18, height 5' 3"  (1.6 m), weight 59 kg, SpO2 98 %.  ? ?General: Pt is alert, awake, not in acute distress ?Cardiovascular: RRR, S1/S2 +, no rubs, no gallops ?Respiratory: CTA bilaterally, no wheezing, no rhonchi ?Abdominal: Soft, NT, ND, bowel sounds +. Biliary drain in place without signs of skin irriation ?Extremities: no edema, no cyanosis ? ? ?The results of significant diagnostics from this hospitalization (including imaging, microbiology, ancillary and laboratory) are listed below for reference.  ? ?Imaging studies: ?CT Abdomen Pelvis Wo Contrast ? ?Result Date: 10/08/2021 ?CLINICAL DATA:  Acute abdominal pain, history of invasive breast carcinoma EXAM: CT ABDOMEN AND PELVIS WITHOUT CONTRAST TECHNIQUE: Multidetector CT imaging of the abdomen and pelvis was performed following the standard protocol without IV contrast. RADIATION DOSE REDUCTION: This exam was performed according to the departmental dose-optimization program which includes automated exposure control, adjustment of the mA and/or kV according to patient size and/or use of iterative reconstruction technique.  COMPARISON:  08/29/2021 FINDINGS: Lower chest: Lung bases demonstrate increasing atelectasis in the bases bilaterally. No discrete pulmonary nodules are noted. No sizable effusion is seen. The previously seen inferior left breast mass is again noted but slightly less dense than that seen on the prior study. Hepatobiliary: Biliary drainage catheter is noted extending from the left lobe of the liver through the common bile duct into the duodenum. The degree of central biliary ductal dilatation has improved on the left although some mild residual dilatation is noted on the right. The gallbladder has been surgically removed. Liver demonstrates no discrete lesions although the lack of IV contrast somewhat limits the exam. Pancreas: Unremarkable. No pancreatic ductal dilatation or surrounding inflammatory changes. Spleen: Normal in size without focal abnormality. Adrenals/Urinary Tract: Adrenal glands are within normal limits. Kidneys are well visualized bilaterally. No renal calculi or obstructive changes are seen. The bladder is decompressed. Stomach/Bowel: Appendix is well visualized with inspissated barium. No obstructive or inflammatory changes of the colon are seen. Changes of recent gastrojejunostomy are noted. Persistent fullness in the region of the second portion of the duodenum is noted consistent with the known history of duodenal obstruction. Vascular/Lymphatic: Aortic atherosclerosis. No enlarged abdominal or pelvic lymph nodes. Reproductive: Uterus and bilateral adnexa are unremarkable. Other:  No abdominal wall hernia or abnormality. No abdominopelvic ascites. Musculoskeletal: No acute or significant osseous findings. IMPRESSION: Changes consistent with recent gastrojejunostomy. Changes of interval internal/external biliary drainage with significant decompression of the left biliary tree. Some mild residual right biliary dilatation is noted although incompletely evaluated on this exam. Stable fullness in  the second portion of the duodenum related to the known metastatic disease. Left inferior breast mass slightly less prominent than that noted on the prior exam. Electronically Signed   By: Inez Catalina M.D.   On: 0

## 2021-10-12 LAB — CULTURE, BLOOD (ROUTINE X 2)
Culture: NO GROWTH
Culture: NO GROWTH

## 2021-10-13 ENCOUNTER — Other Ambulatory Visit: Payer: Self-pay | Admitting: Interventional Radiology

## 2021-10-13 ENCOUNTER — Other Ambulatory Visit: Payer: Self-pay | Admitting: Pharmacist

## 2021-10-13 DIAGNOSIS — K831 Obstruction of bile duct: Secondary | ICD-10-CM

## 2021-10-13 DIAGNOSIS — C50912 Malignant neoplasm of unspecified site of left female breast: Secondary | ICD-10-CM

## 2021-10-13 MED ORDER — RIBOCICLIB SUCC (400 MG DOSE) 200 MG PO TBPK: 400.0000 mg | ORAL_TABLET | Freq: Every day | ORAL | 1 refills | Status: DC

## 2021-10-14 ENCOUNTER — Other Ambulatory Visit: Payer: Medicare HMO

## 2021-10-17 NOTE — Progress Notes (Signed)
?  ? ?I,Sulibeya S Dimas,acting as a scribe for Halie Gass, MD.,have documented all relevant documentation on the behalf of Notnamed Scholz, MD,as directed by  Burtis Imhoff, MD while in the presence of Hisako Bugh, MD. ? ? ?Established patient visit ? ? ?Patient: Diamond Hinton   DOB: 04/18/1956   66 y.o. Female  MRN: [id number] ?Visit Date: 10/18/2021 ? ?Today's healthcare provider: Irini Leet, MD  ? ?Chief Complaint  ?Patient presents with  ? Diabetes  ? Hypertension  ? Hyperlipidemia  ? Hypothyroidism  ? ?Subjective  ?  ?HPI  ?Diabetes Mellitus Type II, follow-up ? ?Lab Results  ?Component Value Date  ? HGBA1C 6.5 (A) 10/18/2021  ? HGBA1C 6.9 (H) 09/14/2021  ? HGBA1C 6.5 (H) 04/20/2021  ? ?Last seen for diabetes 6 months ago.  ?Management since then includes continuing the same treatment.Continue metformin 500mg BID.  ?She reports excellent compliance with treatment. Patient reports Dr. Roa stopped metformin. ?She is not having side effects.  ? ?Home blood sugar records:  not being checked ? ?Episodes of hypoglycemia? No  ?  ?Current insulin regiment: none ?--------------------------------------------------------------------------------------------------- ?Hypertension, follow-up ? ?BP Readings from Last 3 Encounters:  ?10/18/21 130/86  ?10/10/21 (!) 159/90  ?10/07/21 129/78  ? Wt Readings from Last 3 Encounters:  ?10/18/21 125 lb 8 oz (56.9 kg)  ?10/07/21 130 lb (59 kg)  ?10/07/21 129 lb 14.4 oz (58.9 kg)  ?  ? ?She was last seen for hypertension 6 months ago.  ?BP at that visit was 132/86. ?Management since that visit includes Continue HCTZ 25mg daily. ?She reports excellent compliance with treatment. HCTZ was stopped by Dr. Rao ?She is not having side effects.  ?She is exercising. ?She is not adherent to low salt diet.   ?Outside blood pressures are not being checked. ? ?She does not smoke. ? ?Use of agents associated with hypertension: none.   ? ?--------------------------------------------------------------------------------------------------- ?Lipid/Cholesterol, follow-up ? ?Last Lipid Panel: ?Lab Results  ?Component Value Date  ? CHOL 209 (H) 04/20/2021  ? LDLCALC 137 (H) 04/20/2021  ? HDL 41 04/20/2021  ? TRIG 95 09/12/2021  ? ? ?She was last seen for this 6 months ago.  ?Management since that visit includes Discussed diet and exercise ? ?She reports excellent compliance with treatment. ?She is not having side effects.  ? ?Symptoms: ?Yes appetite changes No foot ulcerations  ?No chest pain No chest pressure/discomfort  ?No dyspnea No orthopnea  ?No fatigue No lower extremity edema  ?No palpitations No paroxysmal nocturnal dyspnea  ?No nausea No numbness or tingling of extremity  ?No polydipsia No polyuria  ?No speech difficulty No syncope  ? ? ?Last metabolic panel ?Lab Results  ?Component Value Date  ? GLUCOSE 138 (H) 10/10/2021  ? NA 138 10/10/2021  ? K 4.4 10/10/2021  ? BUN 17 10/10/2021  ? CREATININE 1.24 (H) 10/10/2021  ? EGFR 77 04/20/2021  ? GFRNONAA 48 (L) 10/10/2021  ? CALCIUM 8.3 (L) 10/10/2021  ? AST 42 (H) 10/10/2021  ? ALT 55 (H) 10/10/2021  ? ?The 10-year ASCVD risk score (Arnett DK, et al., 2019) is: 14.8% ? ?---------------------------------------------------------------------------------------------------  ?Hypothyroid, follow-up ? ?Lab Results  ?Component Value Date  ? TSH 34.953 (H) 10/09/2021  ? TSH 2.930 04/20/2021  ? TSH 3.050 10/12/2020  ? FREET4 1.19 08/28/2017  ? ? ?Wt Readings from Last 3 Encounters:  ?10/18/21 125 lb 8 oz (56.9 kg)  ?10/07/21 130 lb (59 kg)  ?10/07/21 129 lb 14.4 oz (58.9 kg)  ? ? ?She   was last seen for hypothyroid 6 months ago.  ?Management since that visit includes Continue levothyroxine 75mcg. ?She reports excellent compliance with treatment. ?She is not having side effects.  ? ?Symptoms: ?No change in energy level No constipation  ?No diarrhea No heat / cold intolerance  ?No nervousness No palpitations   ?Yes weight changes   ? ?-----------------------------------------------------------------------------------------  ?Medications: ?Outpatient Medications Prior to Visit  ?Medication Sig  ? acetaminophen (TYLENOL) 325 MG tablet Take 325 mg by mouth every 6 (six) hours as needed (for pain and sometimes takes 2 if pain is worse).  ? cephALEXin (KEFLEX) 500 MG capsule Take 1 capsule (500 mg total) by mouth 2 (two) times daily for 10 days.  ? feeding supplement (ENSURE ENLIVE / ENSURE PLUS) LIQD Take 237 mLs by mouth 3 (three) times daily between meals.  ? letrozole (FEMARA) 2.5 MG tablet Take 1 tablet (2.5 mg total) by mouth daily.  ? levothyroxine (SYNTHROID) 75 MCG tablet Take 1 tablet (75 mcg total) by mouth daily.  ? Multiple Vitamin (MULTIVITAMIN WITH MINERALS) TABS tablet Take 1 tablet by mouth daily.  ? ondansetron (ZOFRAN) 4 MG tablet Take 1 tablet (4 mg total) by mouth daily as needed for nausea or vomiting.  ? pantoprazole (PROTONIX) 40 MG tablet Take 1 tablet (40 mg total) by mouth daily.  ? prochlorperazine (COMPAZINE) 5 MG tablet Take 1 tablet (5 mg total) by mouth every 6 (six) hours as needed for refractory nausea / vomiting.  ? traZODone (DESYREL) 50 MG tablet Take 1 tablet (50 mg total) by mouth at bedtime as needed for sleep.  ? potassium chloride (KLOR-CON M) 10 MEQ tablet Take 2 tablets (20 mEq total) by mouth daily. (Patient not taking: Reported on 10/18/2021)  ? ribociclib succ (KISQALI 400MG DAILY DOSE) 200 MG Therapy Pack Take 2 tablets (400 mg total) by mouth daily. Take for 21 days on, 7 days off, repeat every 28 days. (Patient not taking: Reported on 10/18/2021)  ? ?No facility-administered medications prior to visit.  ? ? ?Review of Systems  ?Constitutional:  Negative for activity change, appetite change and fatigue.  ?Respiratory:  Negative for chest tightness and shortness of breath.   ?Cardiovascular:  Negative for chest pain, palpitations and leg swelling.  ? ?  ?  Objective  ?  ?BP 130/86  (BP Location: Left Arm, Patient Position: Sitting, Cuff Size: Large)   Pulse 70   Temp 97.7 ?F (36.5 ?C) (Oral)   Resp 20   Wt 125 lb 8 oz (56.9 kg)   SpO2 99%   BMI 22.23 kg/m?  ?BP Readings from Last 3 Encounters:  ?10/18/21 130/86  ?10/10/21 (!) 159/90  ?10/07/21 129/78  ? ?Wt Readings from Last 3 Encounters:  ?10/18/21 125 lb 8 oz (56.9 kg)  ?10/07/21 130 lb (59 kg)  ?10/07/21 129 lb 14.4 oz (58.9 kg)  ? ?  ? ?Physical Exam ?Vitals reviewed.  ?Constitutional:   ?   General: She is not in acute distress. ?   Appearance: Normal appearance. She is well-developed. She is not diaphoretic.  ?HENT:  ?   Head: Normocephalic and atraumatic.  ?Eyes:  ?   General: No scleral icterus. ?   Conjunctiva/sclera: Conjunctivae normal.  ?Neck:  ?   Thyroid: No thyromegaly.  ?Cardiovascular:  ?   Rate and Rhythm: Normal rate and regular rhythm.  ?   Pulses: Normal pulses.  ?   Heart sounds: Normal heart sounds. No murmur heard. ?Pulmonary:  ?   Effort: Pulmonary   effort is normal. No respiratory distress.  ?   Breath sounds: Normal breath sounds. No wheezing, rhonchi or rales.  ?Musculoskeletal:  ?   Cervical back: Neck supple.  ?   Right lower leg: No edema.  ?   Left lower leg: No edema.  ?Lymphadenopathy:  ?   Cervical: No cervical adenopathy.  ?Skin: ?   General: Skin is warm and dry.  ?Neurological:  ?   Mental Status: She is alert and oriented to person, place, and time. Mental status is at baseline.  ?Psychiatric:     ?   Mood and Affect: Mood normal.     ?   Behavior: Behavior normal.  ?  ? ? ?Results for orders placed or performed in visit on 10/18/21  ?POCT glycosylated hemoglobin (Hb A1C)  ?Result Value Ref Range  ? Hemoglobin A1C 6.5 (A) 4.0 - 5.6 %  ? ? Assessment & Plan  ?  ? ?Problem List Items Addressed This Visit   ? ?  ? Cardiovascular and Mediastinum  ? Hypertension associated with diabetes (HCC)  ?  Well controlled ?Not currently on medications ?Recheck metabolic panel ?  ?  ? Relevant Orders  ?  Comprehensive metabolic panel  ?  ? Digestive  ? Obstructive jaundice  ?  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 ?  ?  ? Relevan

## 2021-10-18 ENCOUNTER — Encounter: Payer: Self-pay | Admitting: Family Medicine

## 2021-10-18 ENCOUNTER — Other Ambulatory Visit: Payer: Self-pay

## 2021-10-18 ENCOUNTER — Ambulatory Visit (INDEPENDENT_AMBULATORY_CARE_PROVIDER_SITE_OTHER): Payer: Medicare HMO | Admitting: Family Medicine

## 2021-10-18 VITALS — BP 130/86 | HR 70 | Temp 97.7°F | Resp 20 | Wt 125.5 lb

## 2021-10-18 DIAGNOSIS — E039 Hypothyroidism, unspecified: Secondary | ICD-10-CM

## 2021-10-18 DIAGNOSIS — N179 Acute kidney failure, unspecified: Secondary | ICD-10-CM

## 2021-10-18 DIAGNOSIS — E1169 Type 2 diabetes mellitus with other specified complication: Secondary | ICD-10-CM

## 2021-10-18 DIAGNOSIS — E1159 Type 2 diabetes mellitus with other circulatory complications: Secondary | ICD-10-CM | POA: Diagnosis not present

## 2021-10-18 DIAGNOSIS — K831 Obstruction of bile duct: Secondary | ICD-10-CM | POA: Diagnosis not present

## 2021-10-18 DIAGNOSIS — C50919 Malignant neoplasm of unspecified site of unspecified female breast: Secondary | ICD-10-CM

## 2021-10-18 DIAGNOSIS — E785 Hyperlipidemia, unspecified: Secondary | ICD-10-CM

## 2021-10-18 DIAGNOSIS — I152 Hypertension secondary to endocrine disorders: Secondary | ICD-10-CM

## 2021-10-18 DIAGNOSIS — C50912 Malignant neoplasm of unspecified site of left female breast: Secondary | ICD-10-CM

## 2021-10-18 LAB — POCT GLYCOSYLATED HEMOGLOBIN (HGB A1C): Hemoglobin A1C: 6.5 % — AB (ref 4.0–5.6)

## 2021-10-18 NOTE — Assessment & Plan Note (Signed)
Chronic and stable with A1c 6.5 ?Not currently on medications ?Not eating well so will continue to monitor ?

## 2021-10-18 NOTE — Assessment & Plan Note (Addendum)
Previously stable ?Not on statin - under palliative cancer treatments, statin may not be needed ?

## 2021-10-18 NOTE — Assessment & Plan Note (Signed)
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

## 2021-10-18 NOTE — Assessment & Plan Note (Signed)
Noted during recent hospitalization ?Off of metformin and hctz ?Recheck today ?

## 2021-10-18 NOTE — Assessment & Plan Note (Signed)
F/b Dr Janese Banks ?Palliative chemo ?

## 2021-10-18 NOTE — Assessment & Plan Note (Signed)
Well controlled ?Not currently on medications ?Recheck metabolic panel ?

## 2021-10-18 NOTE — Assessment & Plan Note (Signed)
TSH was high during hospitalization ?She had been off of her synthroid for several weeks and that was resumed ?Will recheck level without med changes for now ?She is asymptomatic ?

## 2021-10-19 ENCOUNTER — Encounter: Payer: Self-pay | Admitting: Oncology

## 2021-10-19 ENCOUNTER — Inpatient Hospital Stay: Payer: Medicare HMO | Admitting: Oncology

## 2021-10-19 ENCOUNTER — Inpatient Hospital Stay: Payer: Medicare HMO | Attending: Oncology

## 2021-10-19 VITALS — BP 119/79 | HR 80 | Temp 97.1°F | Resp 18 | Wt 125.6 lb

## 2021-10-19 DIAGNOSIS — Z79811 Long term (current) use of aromatase inhibitors: Secondary | ICD-10-CM | POA: Insufficient documentation

## 2021-10-19 DIAGNOSIS — C784 Secondary malignant neoplasm of small intestine: Secondary | ICD-10-CM

## 2021-10-19 DIAGNOSIS — Z79899 Other long term (current) drug therapy: Secondary | ICD-10-CM

## 2021-10-19 DIAGNOSIS — C50912 Malignant neoplasm of unspecified site of left female breast: Secondary | ICD-10-CM | POA: Diagnosis not present

## 2021-10-19 LAB — COMPREHENSIVE METABOLIC PANEL
ALT: 44 IU/L — ABNORMAL HIGH (ref 0–32)
ALT: 44 U/L (ref 0–44)
AST: 41 IU/L — ABNORMAL HIGH (ref 0–40)
AST: 39 U/L (ref 15–41)
Albumin/Globulin Ratio: 1.6 (ref 1.2–2.2)
Albumin: 4.8 g/dL (ref 3.8–4.8)
Albumin: 4 g/dL (ref 3.5–5.0)
Alkaline Phosphatase: 124 IU/L — ABNORMAL HIGH (ref 44–121)
Alkaline Phosphatase: 92 U/L (ref 38–126)
Anion gap: 8 (ref 5–15)
BUN/Creatinine Ratio: 29 — ABNORMAL HIGH (ref 12–28)
BUN: 28 mg/dL — ABNORMAL HIGH (ref 8–27)
BUN: 27 mg/dL — ABNORMAL HIGH (ref 8–23)
Bilirubin Total: 0.8 mg/dL (ref 0.0–1.2)
CO2: 18 mmol/L — ABNORMAL LOW (ref 20–29)
CO2: 18 mmol/L — ABNORMAL LOW (ref 22–32)
Calcium: 10.2 mg/dL (ref 8.7–10.3)
Calcium: 9.2 mg/dL (ref 8.9–10.3)
Chloride: 106 mmol/L (ref 96–106)
Chloride: 105 mmol/L (ref 98–111)
Creatinine, Ser: 0.97 mg/dL (ref 0.57–1.00)
Creatinine, Ser: 0.96 mg/dL (ref 0.44–1.00)
GFR, Estimated: >60 mL/min (ref >60)
Globulin, Total: 3 g/dL (ref 1.5–4.5)
Glucose, Bld: 180 mg/dL — ABNORMAL HIGH (ref 70–99)
Glucose: 136 mg/dL — ABNORMAL HIGH (ref 70–99)
Potassium: 5.1 mmol/L (ref 3.5–5.2)
Potassium: 3.4 mmol/L — ABNORMAL LOW (ref 3.5–5.1)
Sodium: 138 mmol/L (ref 134–144)
Sodium: 131 mmol/L — ABNORMAL LOW (ref 135–145)
Total Bilirubin: 1 mg/dL (ref 0.3–1.2)
Total Protein: 7.8 g/dL (ref 6.0–8.5)
Total Protein: 7.8 g/dL (ref 6.5–8.1)
eGFR: 64 mL/min/{1.73_m2} (ref >59)

## 2021-10-19 LAB — CBC WITH DIFFERENTIAL/PLATELET
Abs Immature Granulocytes: 0.05 10*3/uL (ref 0.00–0.07)
Basophils Absolute: 0.1 10*3/uL (ref 0.0–0.1)
Basophils Relative: 1 %
Eosinophils Absolute: 0.1 10*3/uL (ref 0.0–0.5)
Eosinophils Relative: 1 %
HCT: 39.4 % (ref 36.0–46.0)
Hemoglobin: 13.7 g/dL (ref 12.0–15.0)
Immature Granulocytes: 1 %
Lymphocytes Relative: 23 %
Lymphs Abs: 2.2 10*3/uL (ref 0.7–4.0)
MCH: 31.3 pg (ref 26.0–34.0)
MCHC: 34.8 g/dL (ref 30.0–36.0)
MCV: 90 fL (ref 80.0–100.0)
Monocytes Absolute: 0.6 10*3/uL (ref 0.1–1.0)
Monocytes Relative: 6 %
Neutro Abs: 6.5 10*3/uL (ref 1.7–7.7)
Neutrophils Relative %: 68 %
Platelets: 251 10*3/uL (ref 150–400)
RBC: 4.38 MIL/uL (ref 3.87–5.11)
RDW: 15.8 % — ABNORMAL HIGH (ref 11.5–15.5)
WBC: 9.5 10*3/uL (ref 4.0–10.5)
nRBC: 0 % (ref 0.0–0.2)

## 2021-10-19 LAB — TSH: TSH: 33.1 u[IU]/mL — ABNORMAL HIGH (ref 0.450–4.500)

## 2021-10-19 NOTE — Progress Notes (Signed)
Per pt thyroid numbers are high; per PCP it was elevated and might need to increase her dosage in levothyroxine. Currently off KISQALI due to dehydration; looking into a smaller dose. Pt is having lots of gas issues taking: gas-x.  ? ?

## 2021-10-20 ENCOUNTER — Other Ambulatory Visit: Payer: Self-pay

## 2021-10-20 ENCOUNTER — Other Ambulatory Visit (HOSPITAL_COMMUNITY): Payer: Self-pay

## 2021-10-20 ENCOUNTER — Inpatient Hospital Stay: Payer: Medicare HMO

## 2021-10-20 DIAGNOSIS — E039 Hypothyroidism, unspecified: Secondary | ICD-10-CM

## 2021-10-20 MED ORDER — LEVOTHYROXINE SODIUM 88 MCG PO TABS: 88.0000 ug | ORAL_TABLET | Freq: Every day | ORAL | 1 refills | Status: DC

## 2021-10-20 NOTE — Progress Notes (Signed)
Nutrition Follow-up: ? 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  S/p robotic assisted laproscopic gastrojejunostomy on 3/30 ? ?Spoke with patient via phone.  Reports that appetite is improving.  Recent hospital admission for pneumonia, exchanged biliary tube.  Says that some days she is eating better than others.  "Some days I eat all day long."  Enjoying cheese and crackers, chicken thighs but not breast, meatloaf, pork patty, hamburger with bun, egg and toast.  Wanting to purchase ensure enlive (received in the hospital).   ? ? ? ?Medications: reviewed ? ?Labs: reviewed ? ?Anthropometrics:  ? ?Weight 125 lb 9.6 oz ?129 lb 6.4 oz on 4/11 ?142 lb on 04/18/21 ? ? ? ?NUTRITION DIAGNOSIS: Inadequate oral intake improving ? ? ? ?INTERVENTION:  ?Discussed how to purchase ensure products ?Continue small frequent meals including good sources of protein. ?  ? ?MONITORING, EVALUATION, GOAL: weight trends, intake ? ? ?NEXT VISIT: Thursday, June 15 phone call ? ?K[place] B. Zenia Resides, RD, LDN ?Registered Dietitian ?[phone] ? ? ?

## 2021-10-21 ENCOUNTER — Telehealth: Payer: Self-pay | Admitting: Student

## 2021-10-21 NOTE — Telephone Encounter (Signed)
Patient with a history of biliary obstruction with internal/external biliary drain placed by IR. Drain exchanged 10/10/21. Patient called [facility] today with concerns about fluctuating levels of drain output. She states some days it's a lot and some days it's a little. She states the drain flushes well and she's not having any new complaints at this time. I discussed with her the variable nature of drain output and encouraged her to call us if nothing comes out of the drain for a day or two and/or she develops worsening abdominal pain, fever, chills or leaking around the drain site.  ? Soyla Dryer, AGACNP-BC ?[phone] ?10/21/2021, 12:58 PM ? ? ?

## 2021-10-23 ENCOUNTER — Encounter: Payer: Self-pay | Admitting: Oncology

## 2021-10-23 NOTE — Progress Notes (Signed)
? ? ? ?Hematology/Oncology Consult note ?[place]  ?[phone] [phone] ? ?Patient Care Team: ?Virginia Crews, MD as PCP - General (Family Medicine) ?Theodore Demark, RN as Oncology Nurse Navigator  ? ?Name of the patient: Diamond Hinton  ?[id number]  ?February 14, 1956  ? ?Date of visit: 10/23/21 ? 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 ?Chief complaint/ Reason for visit-routine follow-up of breast cancer on letrozole ? ?Heme/Onc history: Patient is a 66 year old female who presented to the hospital in March 2023 with symptoms of gastric outlet obstruction as well as obstructive jaundice.  She was found to have diffuse duodenal wall thickening as well as a palpable mass in the left breast.  Duodenal biopsy as well as left breast biopsy was consistent withER positive greater than 90%, PR weakly +1 to 10% HER2 negative lobular breast cancer.  Patient underwent external biliary drain for the obstructive jaundice and palliative gastrojejunostomy surgery to relieve her gastric outlet obstruction.CT scan otherwise did not show any evidence of distant metastatic disease. ? ?Interval history- ribociclib has been on hold for the last 2 to 3 weeks after patient initially presented with AKI followed by hospitalization for possible sepsis.  Presently patient reports doing better.  She is more active and walking around.  She is also keeping a tab of her fluid intake on a regular basis.  She is tolerating her letrozole along with calcium and vitamin D well.  Denies any nausea or vomiting presently.  She is able to tolerate oral intake ? ?ECOG PS- 1 ?Pain scale- 0 ? ? ?Review of systems- Review of Systems  ?Constitutional:  Negative for chills, fever, malaise/fatigue and weight loss.  ?HENT:  Negative for congestion, ear discharge and nosebleeds.   ?Eyes:  Negative for blurred vision.  ?Respiratory:  Negative for  cough, hemoptysis, sputum production, shortness of breath and wheezing.   ?Cardiovascular:  Negative for chest pain, palpitations, orthopnea and claudication.  ?Gastrointestinal:  Negative for abdominal pain, blood in stool, constipation, diarrhea, heartburn, melena, nausea and vomiting.  ?Genitourinary:  Negative for dysuria, flank pain, frequency, hematuria and urgency.  ?Musculoskeletal:  Negative for back pain, joint pain and myalgias.  ?Skin:  Negative for rash.  ?Neurological:  Negative for dizziness, tingling, focal weakness, seizures, weakness and headaches.  ?Endo/Heme/Allergies:  Does not bruise/bleed easily.  ?Psychiatric/Behavioral:  Negative for depression and suicidal ideas. The patient does not have insomnia.    ? ? ? ?Allergies  ?Allergen Reactions  ? Peanuts [Peanut Oil] Shortness Of Breath and Itching  ? ? ? ?Past Medical History:  ?Diagnosis Date  ? Anemia   ? Anxiety   ? Breast cancer ([place])   ? Gallstones   ? GERD (gastroesophageal reflux disease)   ? Hyperlipidemia   ? Hypertension   ? Jaundice 08/29/2021  ? Rash 08/29/2021  ? Right upper quadrant abdominal pain 08/29/2021  ? UTI (urinary tract infection) 08/29/2021  ? ? ? ?Past Surgical History:  ?Procedure Laterality Date  ? BILATERAL SALPINGECTOMY  04/03/1997  ? CHOLECYSTECTOMY N/A 07/07/2015  ? Procedure: LAPAROSCOPIC CHOLECYSTECTOMY ;  Surgeon: Jules Husbands, MD;  Location: [facility] ORS;  Service: General;  Laterality: N/A;  ? ESOPHAGOGASTRODUODENOSCOPY  08/30/2021  ? Procedure: ESOPHAGOGASTRODUODENOSCOPY (EGD);  Surgeon: Lucilla Lame, MD;  Location: [facility] ENDOSCOPY;  Service: Endoscopy;;  ? GALLBLADDER SURGERY  07/07/2015  ? [place]  ? IR BILIARY DRAIN PLACEMENT WITH CHOLANGIOGRAM  08/31/2021  ? IR CONVERT BILIARY DRAIN TO INT EXT  BILIARY DRAIN  09/02/2021  ? IR EXCHANGE BILIARY DRAIN  10/10/2021  ? ? ?Social History  ? ?Socioeconomic History  ? Marital status: Married  ?  Spouse name: Not on file  ? Number of children: Not on file  ? Years of education:  Not on file  ? Highest education level: Not on file  ?Occupational History  ? Not on file  ?Tobacco Use  ? Smoking status: Never  ? Smokeless tobacco: Never  ?Vaping Use  ? Vaping Use: Never used  ?Substance and Sexual Activity  ? Alcohol use: No  ? Drug use: No  ? Sexual activity: Yes  ?Other Topics Concern  ? Not on file  ?Social History Narrative  ? Not on file  ? ?Social Determinants of Health  ? ?Financial Resource Strain: Not on file  ?Food Insecurity: Not on file  ?Transportation Needs: Not on file  ?Physical Activity: Not on file  ?Stress: Not on file  ?Social Connections: Not on file  ?Intimate Partner Violence: Not on file  ? ? ?Family History  ?Problem Relation Age of Onset  ? Hypertension Mother   ? CVA Mother   ? Ulcers Mother   ? Emphysema Mother   ? Heart disease Father   ? Prostate cancer Father   ? Macular degeneration Father   ? Hypertension Sister   ? Diabetes Sister   ? Cervical polyp Sister   ? Hypertension Sister   ? Diabetes Sister   ? Lymphoma Sister   ? ? ? ?Current Outpatient Medications:  ?  acetaminophen (TYLENOL) 325 MG tablet, Take 325 mg by mouth every 6 (six) hours as needed (for pain and sometimes takes 2 if pain is worse)., Disp: , Rfl:  ?  feeding supplement (ENSURE ENLIVE / ENSURE PLUS) LIQD, Take 237 mLs by mouth 3 (three) times daily between meals., Disp: 237 mL, Rfl: 12 ?  letrozole (FEMARA) 2.5 MG tablet, Take 1 tablet (2.5 mg total) by mouth daily., Disp: 30 tablet, Rfl: 3 ?  Multiple Vitamins-Minerals (CENTRUM VITAMINTS PO), Take by mouth., Disp: , Rfl:  ?  ondansetron (ZOFRAN) 4 MG tablet, Take 1 tablet (4 mg total) by mouth daily as needed for nausea or vomiting., Disp: 30 tablet, Rfl: 1 ?  pantoprazole (PROTONIX) 40 MG tablet, Take 1 tablet (40 mg total) by mouth daily., Disp: 30 tablet, Rfl: 11 ?  potassium chloride (KLOR-CON) 10 MEQ tablet, Take 20 mEq by mouth daily., Disp: , Rfl:  ?  Simethicone (GAS-X PO), Take by mouth. PRN, Disp: , Rfl:  ?  levothyroxine  (SYNTHROID) 88 MCG tablet, Take 1 tablet (88 mcg total) by mouth daily., Disp: 90 tablet, Rfl: 1 ?  Multiple Vitamin (MULTIVITAMIN WITH MINERALS) TABS tablet, Take 1 tablet by mouth daily. (Patient not taking: Reported on 10/19/2021), Disp: , Rfl:  ?  prochlorperazine (COMPAZINE) 5 MG tablet, Take 1 tablet (5 mg total) by mouth every 6 (six) hours as needed for refractory nausea / vomiting. (Patient not taking: Reported on 10/19/2021), Disp: 30 tablet, Rfl: 1 ?  ribociclib succ (KISQALI 400MG DAILY DOSE) 200 MG Therapy Pack, Take 2 tablets (400 mg total) by mouth daily. Take for 21 days on, 7 days off, repeat every 28 days. (Patient not taking: Reported on 10/18/2021), Disp: 42 tablet, Rfl: 1 ?  traZODone (DESYREL) 50 MG tablet, Take 1 tablet (50 mg total) by mouth at bedtime as needed for sleep. (Patient not taking: Reported on 10/19/2021), Disp: 30 tablet, Rfl: 0 ? ?Physical exam:  ?  Vitals:  ? 10/19/21 1409  ?BP: 119/79  ?Pulse: 80  ?Resp: 18  ?Temp: (!) 97.1 ?F (36.2 ?C)  ?SpO2: 100%  ?Weight: 125 lb 9.6 oz (57 kg)  ? ?Physical Exam ?Constitutional:   ?   General: She is not in acute distress. ?Cardiovascular:  ?   Rate and Rhythm: Normal rate and regular rhythm.  ?   Heart sounds: Normal heart sounds.  ?Pulmonary:  ?   Effort: Pulmonary effort is normal.  ?   Breath sounds: Normal breath sounds.  ?Abdominal:  ?   General: Bowel sounds are normal.  ?   Palpations: Abdomen is soft.  ?Skin: ?   General: Skin is warm and dry.  ?Neurological:  ?   Mental Status: She is alert and oriented to person, place, and time.  ?  ? ? ?  Latest Ref Rng & Units 10/19/2021  ?  1:50 PM  ?CMP  ?Glucose 70 - 99 mg/dL 180    ?BUN 8 - 23 mg/dL 27    ?Creatinine 0.44 - 1.00 mg/dL 0.96    ?Sodium 135 - 145 mmol/L 131    ?Potassium 3.5 - 5.1 mmol/L 3.4    ?Chloride 98 - 111 mmol/L 105    ?CO2 22 - 32 mmol/L 18    ?Calcium 8.9 - 10.3 mg/dL 9.2    ?Total Protein 6.5 - 8.1 g/dL 7.8    ?Total Bilirubin 0.3 - 1.2 mg/dL 1.0    ?Alkaline Phos 38 - 126  U/L 92    ?AST 15 - 41 U/L 39    ?ALT 0 - 44 U/L 44    ? ? ?  Latest Ref Rng & Units 10/19/2021  ?  1:50 PM  ?CBC  ?WBC 4.0 - 10.5 K/uL 9.5    ?Hemoglobin 12.0 - 15.0 g/dL 13.7    ?Hematocrit 36.0 - 46.0 % 39.4

## 2021-10-25 ENCOUNTER — Other Ambulatory Visit: Payer: Self-pay

## 2021-10-25 ENCOUNTER — Emergency Department
Admission: EM | Admit: 2021-10-25 | Discharge: 2021-10-25 | Disposition: A | Discharge status: Home / Self Care | Payer: Medicare HMO | Attending: Emergency Medicine | Admitting: Emergency Medicine

## 2021-10-25 ENCOUNTER — Encounter: Payer: Self-pay | Admitting: Emergency Medicine

## 2021-10-25 ENCOUNTER — Inpatient Hospital Stay: Payer: Medicare HMO

## 2021-10-25 ENCOUNTER — Inpatient Hospital Stay: Payer: Medicare HMO | Admitting: Licensed Clinical Social Worker

## 2021-10-25 DIAGNOSIS — K831 Obstruction of bile duct: Secondary | ICD-10-CM | POA: Diagnosis not present

## 2021-10-25 DIAGNOSIS — C240 Malignant neoplasm of extrahepatic bile duct: Secondary | ICD-10-CM | POA: Insufficient documentation

## 2021-10-25 DIAGNOSIS — T85590A Other mechanical complication of bile duct prosthesis, initial encounter: Secondary | ICD-10-CM

## 2021-10-25 DIAGNOSIS — C801 Malignant (primary) neoplasm, unspecified: Secondary | ICD-10-CM

## 2021-10-25 DIAGNOSIS — Z853 Personal history of malignant neoplasm of breast: Secondary | ICD-10-CM | POA: Insufficient documentation

## 2021-10-25 LAB — COMPREHENSIVE METABOLIC PANEL
ALT: 108 U/L — ABNORMAL HIGH (ref 0–44)
AST: 133 U/L — ABNORMAL HIGH (ref 15–41)
Albumin: 4 g/dL (ref 3.5–5.0)
Alkaline Phosphatase: 215 U/L — ABNORMAL HIGH (ref 38–126)
Anion gap: 10 (ref 5–15)
BUN: 27 mg/dL — ABNORMAL HIGH (ref 8–23)
CO2: 18 mmol/L — ABNORMAL LOW (ref 22–32)
Calcium: 9.7 mg/dL (ref 8.9–10.3)
Chloride: 105 mmol/L (ref 98–111)
Creatinine, Ser: 1.12 mg/dL — ABNORMAL HIGH (ref 0.44–1.00)
GFR, Estimated: 54 mL/min — ABNORMAL LOW (ref >60)
Glucose, Bld: 192 mg/dL — ABNORMAL HIGH (ref 70–99)
Potassium: 3.8 mmol/L (ref 3.5–5.1)
Sodium: 133 mmol/L — ABNORMAL LOW (ref 135–145)
Total Bilirubin: 1.6 mg/dL — ABNORMAL HIGH (ref 0.3–1.2)
Total Protein: 8 g/dL (ref 6.5–8.1)

## 2021-10-25 LAB — URINALYSIS, ROUTINE W REFLEX MICROSCOPIC
Bilirubin Urine: NEGATIVE
Glucose, UA: NEGATIVE mg/dL
Hgb urine dipstick: NEGATIVE
Ketones, ur: NEGATIVE mg/dL
Leukocytes,Ua: NEGATIVE
Nitrite: NEGATIVE
Protein, ur: NEGATIVE mg/dL
Specific Gravity, Urine: 1.019 (ref 1.005–1.030)
pH: 5 (ref 5.0–8.0)

## 2021-10-25 LAB — CBC
HCT: 39.4 % (ref 36.0–46.0)
Hemoglobin: 13.4 g/dL (ref 12.0–15.0)
MCH: 30.1 pg (ref 26.0–34.0)
MCHC: 34 g/dL (ref 30.0–36.0)
MCV: 88.5 fL (ref 80.0–100.0)
Platelets: 327 10*3/uL (ref 150–400)
RBC: 4.45 MIL/uL (ref 3.87–5.11)
RDW: 14.7 % (ref 11.5–15.5)
WBC: 11.6 10*3/uL — ABNORMAL HIGH (ref 4.0–10.5)
nRBC: 0 % (ref 0.0–0.2)

## 2021-10-25 LAB — LIPASE, BLOOD: Lipase: 169 U/L — ABNORMAL HIGH (ref 11–51)

## 2021-10-25 LAB — TROPONIN I (HIGH SENSITIVITY): Troponin I (High Sensitivity): 7 ng/L (ref <18)

## 2021-10-25 NOTE — ED Triage Notes (Signed)
Pt to ED from home c/o biliary drain not draining.  States had placed several weeks ago for jaundice.  Emptied bag this morning and states not filling up like normal and appears cloudy in bag.  States some pressure to mid abd, has had a couple BMs today, denies fevers.  Pt A&Ox4, chest rise even and unlabored, in NAD at this time. ?

## 2021-10-25 NOTE — ED Provider Notes (Signed)
? ?[facility] ?Provider Note ? ? ? Event Date/Time  ? First MD Initiated Contact with Patient 10/25/21 2150   ?  (approximate) ? ? ?History  ? ?Biliary Drain ? ? ?HPI ? ?Diamond Hinton is a 66 y.o. female who presents to the ED for evaluation of Biliary Drain ?  ?I reviewed medical DC summary from 5/1.Admitted for sepsis.  Had a previously placed IR biliary drain exchanged and replaced on 5/1.  Palliative gastrojejunostomy due to gastric outlet obstruction.  All due to metastatic breast cancer. ? ?Patient presents to the ED, accompanied by her daughter, for evaluation of nondraining transcutaneous biliary drain.  She reports that she knows how much this was to be draining out of there, and there has been no output since she emptied the bag earlier this morning. ? ?She reports an occasional "fullness" sensation to her epigastrium without pain, fever, emesis or any other changes.  She reports concerned that she may need her tube exchanged again. ? ?Physical Exam  ? ?Triage Vital Signs: ?ED Triage Vitals  ?Enc Vitals Group  ?   BP 10/25/21 2019 133/88  ?   Pulse Rate 10/25/21 2019 (!) 119  ?   Resp 10/25/21 2019 18  ?   Temp 10/25/21 2019 98.7 ?F (37.1 ?C)  ?   Temp Source 10/25/21 2019 Oral  ?   SpO2 10/25/21 2019 95 %  ?   Weight 10/25/21 2030 125 lb (56.7 kg)  ?   Height 10/25/21 2030 '5\' 3"'$  (1.6 m)  ?   Head Circumference --   ?   Peak Flow --   ?   Pain Score 10/25/21 2029 3  ?   Pain Loc --   ?   Pain Edu? --   ?   Excl. in [place]? --   ? ? ?Most recent vital signs: ?Vitals:  ? 10/25/21 2019 10/25/21 2208  ?BP: 133/88 127/80  ?Pulse: (!) 119 93  ?Resp: 18 15  ?Temp: 98.7 ?F (37.1 ?C)   ?SpO2: 95% 97%  ? ? ?General: Awake, no distress.  Pleasant, well-appearing and conversational. ?CV:  Good peripheral perfusion.  ?Resp:  Normal effort.  ?Abd:  No distention.  Biliary drain coming from the epigastrium with clean insertion site without erythema, induration or purulence.  Her back has a small  amount, maybe 10 cc, of thin yellow biliary discharge.  Abdomen is benign. ?MSK:  No deformity noted.  ?Neuro:  No focal deficits appreciated. ?Other:   ? ? ?ED Results / Procedures / Treatments  ? ?Labs ?(all labs ordered are listed, but only abnormal results are displayed) ?Labs Reviewed  ?LIPASE, BLOOD - Abnormal; Notable for the following components:  ?    Result Value  ? Lipase 169 (*)   ? All other components within normal limits  ?COMPREHENSIVE METABOLIC PANEL - Abnormal; Notable for the following components:  ? Sodium 133 (*)   ? CO2 18 (*)   ? Glucose, Bld 192 (*)   ? BUN 27 (*)   ? Creatinine, Ser 1.12 (*)   ? AST 133 (*)   ? ALT 108 (*)   ? Alkaline Phosphatase 215 (*)   ? Total Bilirubin 1.6 (*)   ? GFR, Estimated 54 (*)   ? All other components within normal limits  ?CBC - Abnormal; Notable for the following components:  ? WBC 11.6 (*)   ? All other components within normal limits  ?URINALYSIS, ROUTINE W REFLEX MICROSCOPIC - Abnormal;  Notable for the following components:  ? Color, Urine AMBER (*)   ? APPearance HAZY (*)   ? All other components within normal limits  ?TROPONIN I (HIGH SENSITIVITY)  ? ? ?EKG ? ? ?RADIOLOGY ? ? ?Official radiology report(s): ?No results found. ? ?PROCEDURES and INTERVENTIONS: ? ?Procedures ? ?Medications - No data to display ? ? ?IMPRESSION / MDM / ASSESSMENT AND PLAN / ED COURSE  ?I reviewed the triage vital signs and the nursing notes. ? ?Differential diagnosis includes, but is not limited to, pancreatitis, obstructed catheter, acute cystitis, cholangitis ? ?66 year old female with indwelling biliary drain present to the ED with nondraining catheter, resolved with some simple flushing and gentle pulling back at the bedside and suitable for trial of outpatient management.  Notification for IR exchange of her tube as were able to easily troubleshoot it and make it more functional.  She look systemically well.  Blood work does demonstrate evidence of slight worsening of  her LFTs, bilirubin and alkaline phosphatase consistent with biliary obstruction.  No indications to repeat this blood work as her bag is now draining and functioning.  She looks and feels well to me.  I considered observation admission for IR exchange, but ultimately not necessary. ? ?Clinical Course as of 10/25/21 2321  ?Tue Oct 25, 2021  ?2238 Using a 10 cc sterile flush, I was able to flush and drain her biliary drain and it seems to be functioning now.  Hooked back up to her bag and it is draining into the bag.  We discussed observing her for a few minutes to ensure it stays draining.  She reports feeling okay.  She is thankful. [DS]  ?2320 Reassessed.  Still draining.  About 100 cc out now.  We discussed troubleshooting at home and return precautions for the ED. [DS]  ?  ?Clinical Course User Index ?[DS] Vladimir Crofts, MD  ? ? ? ?FINAL CLINICAL IMPRESSION(S) / ED DIAGNOSES  ? ?Final diagnoses:  ?None  ? ? ? ?Rx / DC Orders  ? ?ED Discharge Orders   ? ? None  ? ?  ? ? ? ?Note:  This document was prepared using Dragon voice recognition software and may include unintentional dictation errors. ?  ?Vladimir Crofts, MD ?10/25/21 2324 ? ?

## 2021-10-27 ENCOUNTER — Other Ambulatory Visit: Payer: Medicare HMO

## 2021-11-01 ENCOUNTER — Inpatient Hospital Stay
Admission: EM | Admit: 2021-11-01 | Discharge: 2021-11-03 | DRG: 872 | Disposition: A | Discharge status: Home / Self Care | Payer: Medicare HMO | Attending: Student in an Organized Health Care Education/Training Program | Admitting: Student in an Organized Health Care Education/Training Program

## 2021-11-01 ENCOUNTER — Other Ambulatory Visit: Payer: Self-pay

## 2021-11-01 ENCOUNTER — Emergency Department: Payer: Medicare HMO

## 2021-11-01 ENCOUNTER — Observation Stay: Payer: Medicare HMO

## 2021-11-01 ENCOUNTER — Encounter: Payer: Self-pay | Admitting: Oncology

## 2021-11-01 ENCOUNTER — Encounter: Payer: Self-pay | Admitting: Emergency Medicine

## 2021-11-01 ENCOUNTER — Telehealth: Payer: Self-pay | Admitting: *Deleted

## 2021-11-01 DIAGNOSIS — Z9049 Acquired absence of other specified parts of digestive tract: Secondary | ICD-10-CM

## 2021-11-01 DIAGNOSIS — E883 Tumor lysis syndrome: Secondary | ICD-10-CM | POA: Diagnosis present

## 2021-11-01 DIAGNOSIS — E875 Hyperkalemia: Secondary | ICD-10-CM | POA: Diagnosis not present

## 2021-11-01 DIAGNOSIS — C50912 Malignant neoplasm of unspecified site of left female breast: Secondary | ICD-10-CM | POA: Diagnosis present

## 2021-11-01 DIAGNOSIS — Z170 Estrogen receptor positive status [ER+]: Secondary | ICD-10-CM

## 2021-11-01 DIAGNOSIS — N1831 Chronic kidney disease, stage 3a: Secondary | ICD-10-CM | POA: Diagnosis present

## 2021-11-01 DIAGNOSIS — E871 Hypo-osmolality and hyponatremia: Secondary | ICD-10-CM | POA: Diagnosis present

## 2021-11-01 DIAGNOSIS — R12 Heartburn: Secondary | ICD-10-CM | POA: Diagnosis not present

## 2021-11-01 DIAGNOSIS — N179 Acute kidney failure, unspecified: Secondary | ICD-10-CM | POA: Diagnosis not present

## 2021-11-01 DIAGNOSIS — K5730 Diverticulosis of large intestine without perforation or abscess without bleeding: Secondary | ICD-10-CM | POA: Diagnosis not present

## 2021-11-01 DIAGNOSIS — R6520 Severe sepsis without septic shock: Secondary | ICD-10-CM | POA: Diagnosis present

## 2021-11-01 DIAGNOSIS — C784 Secondary malignant neoplasm of small intestine: Secondary | ICD-10-CM | POA: Diagnosis present

## 2021-11-01 DIAGNOSIS — I129 Hypertensive chronic kidney disease with stage 1 through stage 4 chronic kidney disease, or unspecified chronic kidney disease: Secondary | ICD-10-CM | POA: Diagnosis present

## 2021-11-01 DIAGNOSIS — Z79890 Hormone replacement therapy (postmenopausal): Secondary | ICD-10-CM

## 2021-11-01 DIAGNOSIS — R630 Anorexia: Secondary | ICD-10-CM | POA: Diagnosis present

## 2021-11-01 DIAGNOSIS — Z833 Family history of diabetes mellitus: Secondary | ICD-10-CM

## 2021-11-01 DIAGNOSIS — I152 Hypertension secondary to endocrine disorders: Secondary | ICD-10-CM | POA: Diagnosis present

## 2021-11-01 DIAGNOSIS — E039 Hypothyroidism, unspecified: Secondary | ICD-10-CM | POA: Diagnosis present

## 2021-11-01 DIAGNOSIS — A419 Sepsis, unspecified organism: Secondary | ICD-10-CM | POA: Diagnosis not present

## 2021-11-01 DIAGNOSIS — E869 Volume depletion, unspecified: Secondary | ICD-10-CM | POA: Diagnosis present

## 2021-11-01 DIAGNOSIS — E1122 Type 2 diabetes mellitus with diabetic chronic kidney disease: Secondary | ICD-10-CM | POA: Diagnosis present

## 2021-11-01 DIAGNOSIS — R112 Nausea with vomiting, unspecified: Secondary | ICD-10-CM | POA: Diagnosis not present

## 2021-11-01 DIAGNOSIS — Z8249 Family history of ischemic heart disease and other diseases of the circulatory system: Secondary | ICD-10-CM

## 2021-11-01 DIAGNOSIS — Z79899 Other long term (current) drug therapy: Secondary | ICD-10-CM

## 2021-11-01 DIAGNOSIS — I10 Essential (primary) hypertension: Secondary | ICD-10-CM | POA: Diagnosis present

## 2021-11-01 DIAGNOSIS — K311 Adult hypertrophic pyloric stenosis: Secondary | ICD-10-CM | POA: Diagnosis present

## 2021-11-01 DIAGNOSIS — C50919 Malignant neoplasm of unspecified site of unspecified female breast: Secondary | ICD-10-CM | POA: Diagnosis present

## 2021-11-01 DIAGNOSIS — Z91010 Allergy to peanuts: Secondary | ICD-10-CM

## 2021-11-01 DIAGNOSIS — Z6821 Body mass index (BMI) 21.0-21.9, adult: Secondary | ICD-10-CM

## 2021-11-01 DIAGNOSIS — E785 Hyperlipidemia, unspecified: Secondary | ICD-10-CM | POA: Diagnosis present

## 2021-11-01 DIAGNOSIS — K219 Gastro-esophageal reflux disease without esophagitis: Secondary | ICD-10-CM | POA: Diagnosis present

## 2021-11-01 DIAGNOSIS — R0602 Shortness of breath: Secondary | ICD-10-CM | POA: Diagnosis not present

## 2021-11-01 DIAGNOSIS — F419 Anxiety disorder, unspecified: Secondary | ICD-10-CM | POA: Diagnosis present

## 2021-11-01 DIAGNOSIS — E1159 Type 2 diabetes mellitus with other circulatory complications: Secondary | ICD-10-CM | POA: Diagnosis present

## 2021-11-01 DIAGNOSIS — E1169 Type 2 diabetes mellitus with other specified complication: Secondary | ICD-10-CM | POA: Diagnosis present

## 2021-11-01 LAB — BASIC METABOLIC PANEL
Anion gap: 17 — ABNORMAL HIGH (ref 5–15)
BUN: 99 mg/dL — ABNORMAL HIGH (ref 8–23)
CO2: 15 mmol/L — ABNORMAL LOW (ref 22–32)
Calcium: 10 mg/dL (ref 8.9–10.3)
Chloride: 98 mmol/L (ref 98–111)
Creatinine, Ser: 3.5 mg/dL — ABNORMAL HIGH (ref 0.44–1.00)
GFR, Estimated: 14 mL/min — ABNORMAL LOW (ref >60)
Glucose, Bld: 281 mg/dL — ABNORMAL HIGH (ref 70–99)
Potassium: 5.4 mmol/L — ABNORMAL HIGH (ref 3.5–5.1)
Sodium: 130 mmol/L — ABNORMAL LOW (ref 135–145)

## 2021-11-01 LAB — BLOOD GAS, VENOUS
Acid-base deficit: 15.8 mmol/L — ABNORMAL HIGH (ref 0.0–2.0)
Bicarbonate: 12.3 mmol/L — ABNORMAL LOW (ref 20.0–28.0)
O2 Saturation: 38.7 %
Patient temperature: 37
pCO2, Ven: 36 mmHg — ABNORMAL LOW (ref 44–60)
pH, Ven: 7.14 — CL (ref 7.25–7.43)
pO2, Ven: <31 mmHg — CL (ref 32–45)

## 2021-11-01 LAB — CBC
HCT: 52.3 % — ABNORMAL HIGH (ref 36.0–46.0)
Hemoglobin: 17.6 g/dL — ABNORMAL HIGH (ref 12.0–15.0)
MCH: 29.6 pg (ref 26.0–34.0)
MCHC: 33.7 g/dL (ref 30.0–36.0)
MCV: 88 fL (ref 80.0–100.0)
Platelets: 736 10*3/uL — ABNORMAL HIGH (ref 150–400)
RBC: 5.94 MIL/uL — ABNORMAL HIGH (ref 3.87–5.11)
RDW: 14.1 % (ref 11.5–15.5)
WBC: 20.7 10*3/uL — ABNORMAL HIGH (ref 4.0–10.5)
nRBC: 0 % (ref 0.0–0.2)

## 2021-11-01 LAB — COMPREHENSIVE METABOLIC PANEL
ALT: 40 U/L (ref 0–44)
AST: 44 U/L — ABNORMAL HIGH (ref 15–41)
Albumin: 5.5 g/dL — ABNORMAL HIGH (ref 3.5–5.0)
Alkaline Phosphatase: 159 U/L — ABNORMAL HIGH (ref 38–126)
Anion gap: 20 — ABNORMAL HIGH (ref 5–15)
BUN: 97 mg/dL — ABNORMAL HIGH (ref 8–23)
CO2: 9 mmol/L — ABNORMAL LOW (ref 22–32)
Calcium: 11.5 mg/dL — ABNORMAL HIGH (ref 8.9–10.3)
Chloride: 92 mmol/L — ABNORMAL LOW (ref 98–111)
Creatinine, Ser: 4.11 mg/dL — ABNORMAL HIGH (ref 0.44–1.00)
GFR, Estimated: 11 mL/min — ABNORMAL LOW (ref >60)
Glucose, Bld: 330 mg/dL — ABNORMAL HIGH (ref 70–99)
Potassium: 7.3 mmol/L (ref 3.5–5.1)
Sodium: 121 mmol/L — ABNORMAL LOW (ref 135–145)
Total Bilirubin: 1.3 mg/dL — ABNORMAL HIGH (ref 0.3–1.2)
Total Protein: 10.1 g/dL — ABNORMAL HIGH (ref 6.5–8.1)

## 2021-11-01 LAB — URINALYSIS, ROUTINE W REFLEX MICROSCOPIC
Bilirubin Urine: NEGATIVE
Glucose, UA: 150 mg/dL — AB
Hgb urine dipstick: NEGATIVE
Ketones, ur: NEGATIVE mg/dL
Leukocytes,Ua: NEGATIVE
Nitrite: NEGATIVE
Protein, ur: NEGATIVE mg/dL
Specific Gravity, Urine: 1.011 (ref 1.005–1.030)
pH: 5 (ref 5.0–8.0)

## 2021-11-01 LAB — LACTIC ACID, PLASMA
Lactic Acid, Venous: 4.1 mmol/L (ref 0.5–1.9)
Lactic Acid, Venous: 4 mmol/L (ref 0.5–1.9)

## 2021-11-01 LAB — LIPASE, BLOOD: Lipase: 271 U/L — ABNORMAL HIGH (ref 11–51)

## 2021-11-01 LAB — URIC ACID: Uric Acid, Serum: 12.7 mg/dL — ABNORMAL HIGH (ref 2.5–7.1)

## 2021-11-01 LAB — PHOSPHORUS: Phosphorus: 7.2 mg/dL — ABNORMAL HIGH (ref 2.5–4.6)

## 2021-11-01 LAB — MAGNESIUM: Magnesium: 2.6 mg/dL — ABNORMAL HIGH (ref 1.7–2.4)

## 2021-11-01 LAB — BETA-HYDROXYBUTYRIC ACID: Beta-Hydroxybutyric Acid: 0.22 mmol/L (ref 0.05–0.27)

## 2021-11-01 MED ORDER — VANCOMYCIN HCL IN DEXTROSE 1-5 GM/200ML-% IV SOLN
1000.0000 mg | Freq: Once | INTRAVENOUS | Status: AC
  Administered 2021-11-01: 1000 mg via INTRAVENOUS
  Filled 2021-11-01: qty 200

## 2021-11-01 MED ORDER — VANCOMYCIN VARIABLE DOSE PER UNSTABLE RENAL FUNCTION (PHARMACIST DOSING): Status: DC

## 2021-11-01 MED ORDER — SODIUM CHLORIDE 0.9 % IV SOLN
0.1000 mg/kg | Freq: Once | INTRAVENOUS | Status: AC
  Administered 2021-11-02: 6 mg via INTRAVENOUS
  Filled 2021-11-01: qty 4

## 2021-11-01 MED ORDER — INSULIN ASPART 100 UNIT/ML IV SOLN
10.0000 [IU] | Freq: Once | INTRAVENOUS | Status: AC
  Administered 2021-11-01: 10 [IU] via INTRAVENOUS
  Filled 2021-11-01: qty 0.1

## 2021-11-01 MED ORDER — METRONIDAZOLE 500 MG/100ML IV SOLN: 500.0000 mg | Freq: Two times a day (BID) | INTRAVENOUS | Status: DC

## 2021-11-01 MED ORDER — LEVOTHYROXINE SODIUM 88 MCG PO TABS
88.0000 ug | ORAL_TABLET | Freq: Every day | ORAL | Status: DC
  Filled 2021-11-01: qty 1

## 2021-11-01 MED ORDER — ALBUTEROL SULFATE (2.5 MG/3ML) 0.083% IN NEBU
15.0000 mg | INHALATION_SOLUTION | Freq: Once | RESPIRATORY_TRACT | Status: AC
  Administered 2021-11-01: 15 mg via RESPIRATORY_TRACT
  Filled 2021-11-01 (×2): qty 18

## 2021-11-01 MED ORDER — SODIUM CHLORIDE 0.9 % IV BOLUS
1000.0000 mL | Freq: Once | INTRAVENOUS | Status: AC
  Administered 2021-11-01: 1000 mL via INTRAVENOUS

## 2021-11-01 MED ORDER — SODIUM CHLORIDE 0.9 % IV SOLN: 100.0000 mg | Freq: Every day | INTRAVENOUS | Status: DC

## 2021-11-01 MED ORDER — FUROSEMIDE 10 MG/ML IJ SOLN
80.0000 mg | Freq: Once | INTRAMUSCULAR | Status: AC
  Administered 2021-11-01: 80 mg via INTRAVENOUS
  Filled 2021-11-01: qty 8

## 2021-11-01 MED ORDER — SODIUM BICARBONATE 8.4 % IV SOLN
50.0000 meq | Freq: Once | INTRAVENOUS | Status: AC
  Administered 2021-11-01: 50 meq via INTRAVENOUS
  Filled 2021-11-01: qty 50

## 2021-11-01 MED ORDER — ACETAMINOPHEN 325 MG RE SUPP: 650.0000 mg | Freq: Four times a day (QID) | RECTAL | Status: DC | PRN

## 2021-11-01 MED ORDER — STERILE WATER FOR INJECTION IV SOLN
INTRAMUSCULAR | Status: DC
  Filled 2021-11-01: qty 150
  Filled 2021-11-01: qty 1000

## 2021-11-01 MED ORDER — METRONIDAZOLE 500 MG/100ML IV SOLN
500.0000 mg | Freq: Once | INTRAVENOUS | Status: AC
  Administered 2021-11-01: 500 mg via INTRAVENOUS
  Filled 2021-11-01: qty 100

## 2021-11-01 MED ORDER — SODIUM CHLORIDE 0.9 % IV BOLUS
1000.0000 mL | Freq: Once | INTRAVENOUS | Status: AC
Start: 2021-11-01 — End: 2021-11-01
  Administered 2021-11-01: 1000 mL via INTRAVENOUS

## 2021-11-01 MED ORDER — SODIUM CHLORIDE 0.9 % IV SOLN
INTRAVENOUS | Status: DC
Start: 2021-11-01 — End: 2021-11-02

## 2021-11-01 MED ORDER — DEXTROSE 50 % IV SOLN
25.0000 g | Freq: Once | INTRAVENOUS | Status: AC
Start: 2021-11-01 — End: 2021-11-01
  Administered 2021-11-01: 25 g via INTRAVENOUS
  Filled 2021-11-01: qty 50

## 2021-11-01 MED ORDER — ONDANSETRON HCL 4 MG/2ML IJ SOLN
4.0000 mg | Freq: Once | INTRAMUSCULAR | Status: AC
  Administered 2021-11-01: 4 mg via INTRAVENOUS
  Filled 2021-11-01: qty 2

## 2021-11-01 MED ORDER — ACETAMINOPHEN 325 MG PO TABS: 650.0000 mg | ORAL_TABLET | Freq: Four times a day (QID) | ORAL | Status: DC | PRN

## 2021-11-01 MED ORDER — CALCIUM GLUCONATE-NACL 1-0.675 GM/50ML-% IV SOLN
1.0000 g | Freq: Once | INTRAVENOUS | Status: AC
  Administered 2021-11-01: 1000 mg via INTRAVENOUS
  Filled 2021-11-01: qty 50

## 2021-11-01 MED ORDER — HEPARIN SODIUM (PORCINE) 5000 UNIT/ML IJ SOLN
5000.0000 [IU] | Freq: Three times a day (TID) | INTRAMUSCULAR | Status: DC
  Administered 2021-11-01: 5000 [IU] via SUBCUTANEOUS
  Filled 2021-11-01: qty 1

## 2021-11-01 MED ORDER — SODIUM CHLORIDE 0.9 % IV SOLN
2.0000 g | Freq: Once | INTRAVENOUS | Status: AC
  Administered 2021-11-01: 2 g via INTRAVENOUS
  Filled 2021-11-01: qty 12.5

## 2021-11-01 MED ORDER — SODIUM CHLORIDE 0.9 % IV SOLN: 2.0000 g | INTRAVENOUS | Status: DC

## 2021-11-01 MED ORDER — ONDANSETRON HCL 4 MG PO TABS: 4.0000 mg | ORAL_TABLET | Freq: Four times a day (QID) | ORAL | Status: DC | PRN

## 2021-11-01 MED ORDER — ONDANSETRON HCL 4 MG/2ML IJ SOLN: 4.0000 mg | Freq: Four times a day (QID) | INTRAMUSCULAR | Status: DC | PRN

## 2021-11-01 MED ORDER — POLYETHYLENE GLYCOL 3350 17 G PO PACK: 17.0000 g | PACK | Freq: Every day | ORAL | Status: DC | PRN

## 2021-11-01 NOTE — Assessment & Plan Note (Signed)
-   Rasburicase 6 mg IV one-time dose ordered - Consulted medical oncology, Dr. Grayland Ormond over the phone and via secure chat message - Epic order placed - Status post sodium chloride 2 L bolus per EDP - Recheck uric acid in the a.m., phosphorus, magnesium, CMP

## 2021-11-01 NOTE — Telephone Encounter (Signed)
I went to jennifer to set pt up for [facility] appt tom. And the patient is in the ER right now. She does have an appt for [facility]. I will check on her tomorrow and see what is going on.

## 2021-11-01 NOTE — Consult Note (Signed)
PHARMACY -  BRIEF ANTIBIOTIC NOTE   Pharmacy has received consult(s) for Vancomycin & Cefepime from an ED provider.  The patient's profile has been reviewed for ht/wt/allergies/indication/available labs.    One time order(s) placed for: Vancomycin 1g IV x1 in ED Cefepime 2g IV x1 in ED Pt also receiving Metronidazole '500mg'$  IV x1 in ED  Further antibiotics/pharmacy consults should be ordered by admitting physician if indicated.                       Thank you, Lorna Dibble 11/01/2021  8:20 PM

## 2021-11-01 NOTE — ED Triage Notes (Signed)
Pt to ED from home c/o n/v x2 yesterday.  States hx of left breast cancer and nausea meds are not helping at home.  Pt currently on chemo. States some epigastric discomfort and belching.  Pt A&Ox4, chest rise even and unlabored, skin WNL and in NAD at this time.

## 2021-11-01 NOTE — Assessment & Plan Note (Addendum)
-   Meets SIRS criteria with organ injury Increased heart rate, leukocytosis of 20.7, increased lactic acid to 4.1, renal involvement - Etiology work-up in progress - Blood cultures x2 are in process - Minimal improvement in lactic acid level, ordered 2 more - Cefepime and vancomycin per pharmacy - Check MRSA PCR - UA collected in the ED showed negative for nitrates and leukocytes

## 2021-11-01 NOTE — H&P (Incomplete)
History and Physical   Diamond Hinton ZDG:[id number] DOB: [birth year] DOA: 11/01/2021  PCP: Virginia Crews, MD  Outpatient Specialists: Dr. Janese Banks, medical oncology Patient coming from: Home  I have personally briefly reviewed patient's old medical records in [place].  Chief Concern: Nausea vomiting  HPI: Ms. 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  Initial vitals in the emergency department showed temperature of 97, respiration rate of 16, heart rate of 106, blood pressure 115/84, SPO2 of 95% on room air.  Serum sodium is 121, potassium 7.3, chloride 92, bicarb of 9, BUN of 97, serum creatinine of 4.11, nonfasting blood glucose 330, repeat BMP showed serum sodium 130, potassium 5.4, chloride 98, bicarb 15, BUN 99, serum creatinine of 3.50, GFR of 14.  WBC was 20.7, hemoglobin 17.6, platelets of 736.  ED treatment: Albuterol nebulizer with 15 mg, D50 IV, furosemide 80 mg IV, insulin aspart 10 units, ondansetron 4 mg IV, 2 doses of sodium bicarb 50 mill equivalent, 2 dosescalcium gluconate 1 g IV, sodium chloride 2 L bolus, additional sodium bicarb 150 mill equivalent in water infusion, vancomycin 1 g IV, cefepime, metronidazole.  At bedside patient is able to tell me her name, her age, the current calendar year and she knows she is in the hospital.  She is also able to identify her daughter at bedside.  She states that on day of admission, she vomited probably about 2-3 times at home.  The initial vomitus was white phlegm light in color, with specks of meat material as she recently had meat loaf.  The following other vomitus was just sputum.  She endorses poor p.o. intake for 1 day and endorses poor appetite over the last day.  She denies any known  fever, shortness of breath, chest pain, abdominal pain, dysuria, hematuria, diarrhea, skin rashes on her person.  Social history: She lives at home.  She denies history of tobacco, EtOH, recreational drug use.  ROS: Constitutional: no weight change, no fever ENT/Mouth: no sore throat, no rhinorrhea Eyes: no eye pain, no vision changes Cardiovascular: no chest pain, no dyspnea,  no edema, no palpitations Respiratory: no cough, no sputum, no wheezing Gastrointestinal: + nausea, + vomiting, no diarrhea, no constipation Genitourinary: no urinary incontinence, no dysuria, no hematuria Musculoskeletal: no arthralgias, no myalgias Skin: no skin lesions, no pruritus, Neuro: + weakness, no loss of consciousness, no syncope Psych: no anxiety, no depression, + decrease appetite Heme/Lymph: no bruising, no bleeding  ED Course: Discussed with emergency medicine provider, patient requiring hospitalization for chief concerns of multiple issues including acute kidney injury, hyperkalemia, and concerning for possible tumor lysis.  Assessment/Plan  Principal Problem:   Tumor lysis syndrome Active Problems:   Severe sepsis (HCC)   AKI (acute kidney injury) ([place])   Adult hypothyroidism   Hypertension associated with diabetes ([place])   Gastric outlet obstruction   Primary malignant neoplasm of breast with metastasis ([place])   Hyperlipidemia associated with type 2 diabetes mellitus ([place])   Hyperkalemia   Assessment and Plan:  * Tumor lysis syndrome - Rasburicase 6 mg IV one-time dose ordered - Consulted medical oncology, Dr. Grayland Ormond over the phone and via secure chat message - Epic order placed - Status post sodium chloride 2 L bolus per EDP - Ordered additional sodium chloride 1  L bolus - Ordered sodium chloride IVF at 125 mL/h - Recheck uric acid in the a.m., phosphorus, magnesium, CMP  Severe sepsis (HCC) - Meets SIRS criteria with organ injury Increased heart rate, leukocytosis of 20.7,  increased lactic acid to 4.1, renal involvement - Etiology work-up in progress - Blood cultures x2 are in process - Minimal improvement in lactic acid level, ordered 2 more - Cefepime and vancomycin per pharmacy - Check MRSA PCR - UA collected in the ED showed negative for nitrates and leukocytes  AKI (acute kidney injury) ([place]) - Etiology unclear however I do suspect there is a prerenal component involved - CMP in the a.m.   Adult hypothyroidism - Levothyroxine 88 mcg daily resumed  Hyperkalemia - Status post albuterol 50 mg, D50, insulin 10 units aspart, 2 doses of sodium bicarb 50 mill equivalent, 2 doses of calcium gluconate 1 g IV, sodium chloride 2 L bolus, furosemide 80 mg IV one-time dose - Nephrology has been consulted by EDP, we appreciate further recommendations and assistance - Check BMP in the a.m.  Chart reviewed.   DVT prophylaxis: Heparin 5000 units subcutaneous every 8 hours Code Status: Full code Diet: Renal/carb modified Family Communication: Updated daughter at bedside Disposition Plan: Pending clinical course Consults called: Oncology, nephrology Admission status: Progressive, observation  Past Medical History:  Diagnosis Date   Anemia    Anxiety    Breast cancer ([place])    Gallstones    GERD (gastroesophageal reflux disease)    Hyperlipidemia    Hypertension    Jaundice 08/29/2021   Rash 08/29/2021   Right upper quadrant abdominal pain 08/29/2021   UTI (urinary tract infection) 08/29/2021   Past Surgical History:  Procedure Laterality Date   BILATERAL SALPINGECTOMY  04/03/1997   CHOLECYSTECTOMY N/A 07/07/2015   Procedure: LAPAROSCOPIC CHOLECYSTECTOMY ;  Surgeon: Jules Husbands, MD;  Location: [facility] ORS;  Service: General;  Laterality: N/A;   ESOPHAGOGASTRODUODENOSCOPY  08/30/2021   Procedure: ESOPHAGOGASTRODUODENOSCOPY (EGD);  Surgeon: Lucilla Lame, MD;  Location: [facility] END[facility]COPY;  Service: Endoscopy;;   GALLBLADDER SURGERY  07/07/2015   [facility]   IR  BILIARY DRAIN PLACEMENT WITH CHOLANGIOGRAM  08/31/2021   IR CONVERT BILIARY DRAIN TO INT EXT BILIARY DRAIN  09/02/2021   IR EXCHANGE BILIARY DRAIN  10/10/2021   Social History:  reports that she has never smoked. She has never used smokeless tobacco. She reports that she does not drink alcohol and does not use drugs.  Allergies  Allergen Reactions   Peanuts [Peanut Oil] Shortness Of Breath and Itching   Family History  Problem Relation Age of Onset   Hypertension Mother    CVA Mother    Ulcers Mother    Emphysema Mother    Heart disease Father    Prostate cancer Father    Macular degeneration Father    Hypertension Sister    Diabetes Sister    Cervical polyp Sister    Hypertension Sister    Diabetes Sister    Lymphoma Sister    Family history: Family history reviewed and not pertinent.  Prior to Admission medications   Medication Sig Start Date End Date Taking? Authorizing Provider  acetaminophen (TYLENOL) 325 MG tablet Take 325 mg by mouth every 6 (six) hours as needed (for pain and sometimes takes 2 if pain is worse).   Yes [provider]  feeding supplement (ENSURE ENLIVE / ENSURE PLUS) LIQD Take 237 mLs by mouth 3 (three) times daily between meals. 09/14/21  Yes Annita Brod, MD  letrozole (FEMARA) 2.5 MG tablet Take 1 tablet (2.5 mg total) by mouth daily. 09/20/21  Yes Sindy Guadeloupe, MD  levothyroxine (SYNTHROID) 88 MCG tablet Take 1 tablet (88 mcg total) by mouth daily. 10/20/21  Yes Bacigalupo, Dionne Bucy, MD  Multiple Vitamins-Minerals (CENTRUM VITAMINTS PO) Take 1 tablet by mouth daily.   Yes [provider]  ondansetron (ZOFRAN) 4 MG tablet Take 1 tablet (4 mg total) by mouth daily as needed for nausea or vomiting. 09/14/21 09/14/22 Yes Annita Brod, MD  pantoprazole (PROTONIX) 40 MG tablet Take 1 tablet (40 mg total) by mouth daily. 09/14/21 09/14/22 Yes Annita Brod, MD  potassium chloride (KLOR-CON) 10 MEQ tablet Take 20 mEq by mouth daily.  10/04/21  Yes [provider]  prochlorperazine (COMPAZINE) 5 MG tablet Take 1 tablet (5 mg total) by mouth every 6 (six) hours as needed for refractory nausea / vomiting. 09/14/21  Yes Annita Brod, MD  ribociclib succ [facility] '400MG'$  DAILY DOSE) 200 MG Therapy Pack Take 2 tablets (400 mg total) by mouth daily. Take for 21 days on, 7 days off, repeat every 28 days. 10/13/21  Yes Sindy Guadeloupe, MD  Simethicone (GAS-X PO) Take by mouth. PRN   Yes [provider]  traZODone (DESYREL) 50 MG tablet Take 1 tablet (50 mg total) by mouth at bedtime as needed for sleep. 10/07/21  Yes Hughie Closs, PA-C   Physical Exam: Vitals:   11/01/21 2330 11/02/21 0000 11/02/21 0030 11/02/21 0100  BP: 128/73 131/70 118/68 124/65  Pulse: 91 88 92 89  Resp: 17 (!) '22 18 17  '$ Temp:      TempSrc:      SpO2: 99% 99% 98% 97%  Weight:      Height:       Constitutional: appears older than chronological age, frail, NAD, calm, comfortable Eyes: PERRL, lids and conjunctivae normal ENMT: Mucous membranes are moist. Posterior pharynx clear of any exudate or lesions. Age-appropriate dentition. Hearing appropriate Neck: normal, supple, no masses, no thyromegaly Respiratory: clear to auscultation bilaterally, no wheezing, no crackles. Normal respiratory effort. No accessory muscle use.  Cardiovascular: Regular rate and rhythm, no murmurs / rubs / gallops. No extremity edema. 2+ pedal pulses. No carotid bruits.  Abdomen: no tenderness, no masses palpated, no hepatosplenomegaly. Bowel sounds positive.  Musculoskeletal: no clubbing / cyanosis. No joint deformity upper and lower extremities. Good ROM, no contractures, no atrophy. Normal muscle tone.  Skin: no rashes, lesions, ulcers. No induration Neurologic: Sensation intact. Strength 5/5 in all 4.  Psychiatric: Normal judgment and insight. Alert and oriented x 3. Normal mood.   EKG: independently reviewed, showing sinus tachycardia with rate of 103,  QTc 406  Chest x-ray on Admission: I personally reviewed and I agree with radiologist reading as below.  CT ABDOMEN PELVIS WO CONTRAST  Result Date: 11/01/2021 CLINICAL DATA:  Nausea vomiting EXAM: CT ABDOMEN AND PELVIS WITHOUT CONTRAST TECHNIQUE: Multidetector CT imaging of the abdomen and pelvis was performed following the standard protocol without IV contrast. RADIATION DOSE REDUCTION: This exam was performed according to the departmental dose-optimization program which includes automated exposure control, adjustment of the mA and/or kV according to patient size and/or use of iterative reconstruction technique. COMPARISON:  CT 10/07/2021, 08/29/2021 FINDINGS: Lower chest: Improved aeration at the lung bases with residual scarring. No acute consolidation or pleural effusion. Coronary vascular calcification. Mildly irregular density within the inferior left breast as before. Hepatobiliary: Internal external biliary drainage catheter with pigtail in the  second portion of the duodenum. Status post cholecystectomy. No significant biliary dilatation. Pancreas: Unremarkable. No pancreatic ductal dilatation or surrounding inflammatory changes. Spleen: Normal in size without focal abnormality. Adrenals/Urinary Tract: Adrenal glands are normal. Kidneys show no hydronephrosis. The bladder is unremarkable Stomach/Bowel: Postsurgical changes of the stomach consistent with gastro jejunostomy. No obstructive features. Soft tissue thickening at the second portion of duodenum as before. Remainder of the small bowel is nondistended. Moderate to large stool burden. Negative appendix. Diverticular disease of the left colon Vascular/Lymphatic: Advanced aortic atherosclerosis. No aneurysm. No suspicious lymph node Reproductive: Uterus and bilateral adnexa are unremarkable. Other: Negative for pelvic effusion or free air Musculoskeletal: Degenerative changes of the spine. No acute osseous abnormality IMPRESSION: 1. No evidence  for bowel obstruction on the current exam. No evidence for new bowel wall thickening. Status post gastrojejunostomy without adverse features at this time. Redemonstrated soft tissue thickening/abnormality in the region of the second portion of duodenum 2. Similar positioning of internal external biliary drainage catheter without significant biliary dilatation 3. Redemonstrated mild irregular density at the inferior left breast for which outpatient mammography and ultrasound was previously advised Electronically Signed   By: Donavan Foil M.D.   On: 11/01/2021 18:58   US Venous Img Lower Bilateral (DVT)  Result Date: 11/02/2021 CLINICAL DATA:  Leg swelling x1 day EXAM: BILATERAL LOWER EXTREMITY VENOUS DOPPLER ULTRASOUND TECHNIQUE: Gray-scale sonography with compression, as well as color and duplex ultrasound, were performed to evaluate the deep venous system(s) from the level of the common femoral vein through the popliteal and proximal calf veins. COMPARISON:  None Available. FINDINGS: VENOUS Normal compressibility of the common femoral, superficial femoral, and popliteal veins, as well as the visualized calf veins. Visualized portions of profunda femoral vein and great saphenous vein unremarkable. No filling defects to suggest DVT on grayscale or color Doppler imaging. Doppler waveforms show normal direction of venous flow, normal respiratory plasticity and response to augmentation. OTHER None. Limitations: none IMPRESSION: Negative. Electronically Signed   By: Julian Hy M.D.   On: 11/02/2021 00:16   DG Chest Portable 1 View  Result Date: 11/01/2021 CLINICAL DATA:  Provided history: Shortness of breath. Additional history provided: Patient reports nausea/vomiting. History of breast cancer and hypertension. EXAM: PORTABLE CHEST 1 VIEW COMPARISON:  Same day CT abdomen/pelvis 11/01/2021. Prior chest radiographs 10/07/2021 and earlier. FINDINGS: A small portion of the left lateral costophrenic angle is  excluded from the field of view. Heart size within normal limits. Foci of linear atelectasis versus scarring within the bilateral lung bases. No appreciable airspace consolidation or pulmonary edema. No evidence of pleural effusion or pneumothorax. No acute bony abnormality identified. IMPRESSION: A small portion of the left lateral costophrenic angle is excluded from the field of view. Foci of linear atelectasis versus scarring within the bilateral lung bases. No appreciable airspace consolidation or pulmonary edema. Electronically Signed   By: Kellie Simmering D.O.   On: 11/01/2021 18:56    Labs on Admission: I have personally reviewed following labs  CBC: Recent Labs  Lab 11/01/21 1653  WBC 20.7*  HGB 17.6*  HCT 52.3*  MCV 88.0  PLT 628*   Basic Metabolic Panel: Recent Labs  Lab 11/01/21 1653 11/01/21 1847 11/01/21 2048  NA 121*  --  130*  K 7.3*  --  5.4*  CL 92*  --  98  CO2 9*  --  15*  GLUCOSE 330*  --  281*  BUN 97*  --  99*  CREATININE 4.11*  --  3.50*  CALCIUM 11.5*  --  10.0  MG  --  2.6*  --   PHOS  --  7.2*  --    GFR: Estimated Creatinine Clearance: 12.9 mL/min (A) (by C-G formula based on SCr of 3.5 mg/dL (H)).  Liver Function Tests: Recent Labs  Lab 11/01/21 1653  AST 44*  ALT 40  ALKPHOS 159*  BILITOT 1.3*  PROT 10.1*  ALBUMIN 5.5*   Recent Labs  Lab 11/01/21 1653  LIPASE 271*   Urine analysis:    Component Value Date/Time   COLORURINE YELLOW (A) 11/01/2021 1653   APPEARANCEUR HAZY (A) 11/01/2021 1653   LABSPEC 1.011 11/01/2021 1653   PHURINE 5.0 11/01/2021 1653   GLUCOSEU 150 (A) 11/01/2021 1653   HGBUR NEGATIVE 11/01/2021 1653   BILIRUBINUR NEGATIVE 11/01/2021 1653   BILIRUBINUR Negative 08/04/2021 0851   KETONESUR NEGATIVE 11/01/2021 1653   PROTEINUR NEGATIVE 11/01/2021 1653   UROBILINOGEN 0.2 08/04/2021 0851   NITRITE NEGATIVE 11/01/2021 1653   LEUKOCYTESUR NEGATIVE 11/01/2021 1653   CRITICAL CARE Performed by: Briant Cedar Kinsey Karch  Total  critical care time: 40 minutes  Critical care time was exclusive of separately billable procedures and treating other patients.  Critical care was necessary to treat or prevent imminent or life-threatening deterioration.  SIRS with organ involvement, circulatory failure, tumor lysis risk  Critical care was time spent personally by me on the following activities: development of treatment plan with patient and/or surrogate as well as nursing, discussions with consultants, evaluation of patient's response to treatment, examination of patient, obtaining history from patient or surrogate, ordering and performing treatments and interventions, ordering and review of laboratory studies, ordering and review of radiographic studies, pulse oximetry and re-evaluation of patient's condition.  Dr. Tobie Poet [facility]  If 7PM-7AM, please contact overnight-coverage provider If 7AM-7PM, please contact day coverage provider [email or web address]  11/02/2021, 2:00 AM

## 2021-11-01 NOTE — Sepsis Progress Note (Signed)
Elink following Code Sepsis. 

## 2021-11-01 NOTE — Consult Note (Signed)
CODE SEPSIS - PHARMACY COMMUNICATION  **Broad Spectrum Antibiotics should be administered within 1 hour of Sepsis diagnosis**  Time Code Sepsis Called/Page Received: 2002  Antibiotics Ordered: 2015  Time of 1st antibiotic administration: 2019  Additional action taken by pharmacy: N/A  If necessary, Name of Provider/Nurse Contacted: N/A    Lorna Dibble ,PharmD Clinical Pharmacist  11/01/2021  8:21 PM

## 2021-11-01 NOTE — ED Provider Notes (Addendum)
[facility] Provider Note    Event Date/Time   First MD Initiated Contact with Patient 11/01/21 1702     (approximate)   History   Nausea and Vomiting   HPI  Diamond Hinton is a 66 y.o. 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  Patient has been feeling sick for the last 2 days.  Today has had several episodes of nonbilious nonbloody emesis.  Denies significant abdominal pain.  She still has a biliary drain in place and has been overall slowing down but still draining.  Has had chills but no fever.  Mild cough but nonproductive no dyspnea or chest pain.  Overall she has had poor appetite.  Has been hypokalemic in the past she was started having some's in her feet and thought she may be could be hypokalemic so took potassium supplementation over the last several days.  She is on chemotherapy orally last took this last night.     Past Medical History:  Diagnosis Date   Anemia    Anxiety    Breast cancer ([facility])    Gallstones    GERD (gastroesophageal reflux disease)    Hyperlipidemia    Hypertension    Jaundice 08/29/2021   Rash 08/29/2021   Right upper quadrant abdominal pain 08/29/2021   UTI (urinary tract infection) 08/29/2021    Patient Active Problem List   Diagnosis Date Noted   AKI (acute kidney injury) ([place]) 10/08/2021   Goals of care, counseling/discussion    Palliative care encounter    Hypophosphatemia 09/05/2021   Constipation 09/04/2021   Stenosis of duodenum    Obstructive jaundice 08/29/2021   Primary malignant neoplasm of breast with metastasis ([place]) 08/29/2021   History of cholecystectomy 08/29/2021   Gastric outlet obstruction 08/29/2021   Hypokalemia 08/29/2021   Elevated lipase 08/29/2021   Reflux gastritis 08/04/2021   Diabetes mellitus ([place]) 04/18/2021    Hypertension associated with diabetes ([place]) 04/18/2021   Hyperlipidemia associated with type 2 diabetes mellitus ([place]) 02/19/2015   Abnormal LFTs 02/19/2015   Adult hypothyroidism 02/19/2015     Physical Exam  Triage Vital Signs: ED Triage Vitals  Enc Vitals Group     BP 11/01/21 1645 115/84     Pulse Rate 11/01/21 1645 (!) 106     Resp 11/01/21 1645 16     Temp 11/01/21 1645 (!) 97 F (36.1 C)     Temp Source 11/01/21 1645 Axillary     SpO2 11/01/21 1645 95 %     Weight 11/01/21 1649 122 lb (55.3 kg)     Height 11/01/21 1649 '5\' 3"'$  (1.6 m)     Head Circumference --      Peak Flow --      Pain Score 11/01/21 1649 1     Pain Loc --      Pain Edu? --      Excl. in [place]? --     Most recent vital signs: Vitals:   11/01/21 1800 11/01/21 2000  BP: (!) 153/84 137/83  Pulse: 85 99  Resp: 19 (!) 21  Temp:    SpO2: 99% 98%     General: Awake, no distress.  CV:  Good peripheral perfusion.  Resp:  Normal effort.  Abd:  No distention.  Biliary drain in place, draining bilious drainage abdomen is soft Neuro:  Awake, Alert, Oriented x 3  Other:  Dry mucous membranes   ED Results / Procedures / Treatments  Labs (all labs ordered are listed, but only abnormal results are displayed) Labs Reviewed  LIPASE, BLOOD - Abnormal; Notable for the following components:      Result Value   Lipase 271 (*)    All other components within normal limits  COMPREHENSIVE METABOLIC PANEL - Abnormal; Notable for the following components:   Sodium 121 (*)    Potassium 7.3 (*)    Chloride 92 (*)    CO2 9 (*)    Glucose, Bld 330 (*)    BUN 97 (*)    Creatinine, Ser 4.11 (*)    Calcium 11.5 (*)    Total Protein 10.1 (*)    Albumin 5.5 (*)    AST 44 (*)    Alkaline Phosphatase 159 (*)    Total Bilirubin 1.3 (*)    GFR, Estimated 11 (*)    Anion gap 20 (*)    All other components within normal limits  CBC - Abnormal; Notable for the following components:   WBC 20.7 (*)    RBC  5.94 (*)    Hemoglobin 17.6 (*)    HCT 52.3 (*)    Platelets 736 (*)    All other components within normal limits  URINALYSIS, ROUTINE W REFLEX MICROSCOPIC - Abnormal; Notable for the following components:   Color, Urine YELLOW (*)    APPearance HAZY (*)    Glucose, UA 150 (*)    All other components within normal limits  LACTIC ACID, PLASMA - Abnormal; Notable for the following components:   Lactic Acid, Venous 4.1 (*)    All other components within normal limits  LACTIC ACID, PLASMA - Abnormal; Notable for the following components:   Lactic Acid, Venous 4.0 (*)    All other components within normal limits  BLOOD GAS, VENOUS - Abnormal; Notable for the following components:   pH, Ven 7.14 (*)    pCO2, Ven 36 (*)    pO2, Ven <31 (*)    Bicarbonate 12.3 (*)    Acid-base deficit 15.8 (*)    All other components within normal limits  MAGNESIUM - Abnormal; Notable for the following components:   Magnesium 2.6 (*)    All other components within normal limits  PHOSPHORUS - Abnormal; Notable for the following components:   Phosphorus 7.2 (*)    All other components within normal limits  URIC ACID - Abnormal; Notable for the following components:   Uric Acid, Serum 12.7 (*)    All other components within normal limits  BASIC METABOLIC PANEL - Abnormal; Notable for the following components:   Sodium 130 (*)    Potassium 5.4 (*)    CO2 15 (*)    Glucose, Bld 281 (*)    BUN 99 (*)    Creatinine, Ser 3.50 (*)    GFR, Estimated 14 (*)    Anion gap 17 (*)    All other components within normal limits  CULTURE, BLOOD (ROUTINE X 2)  CULTURE, BLOOD (ROUTINE X 2)  BETA-HYDROXYBUTYRIC ACID     EKG  EKG interpreted by myself shows sinus tachycardia with peaked T waves, left axis deviation, normal intervals   RADIOLOGY I reviewed the CXR which does not show any acute cardiopulmonary process; agree with radiology report     PROCEDURES:  Critical Care performed: Yes, see critical  care procedure note(s)  .1-3 Lead EKG Interpretation Performed by: Starleen Blue,  Jennette Dubin, MD Authorized by: Rada Hay, MD     Interpretation: normal     ECG rate assessment: normal     Rhythm: sinus rhythm     Ectopy: none     Conduction: normal   .Critical Care Performed by: Rada Hay, MD Authorized by: Rada Hay, MD   Critical care provider statement:    Critical care time (minutes):  80   Critical care was time spent personally by me on the following activities:  Development of treatment plan with patient or surrogate, discussions with consultants, evaluation of patient's response to treatment, examination of patient, ordering and review of laboratory studies, ordering and review of radiographic studies, ordering and performing treatments and interventions, pulse oximetry, re-evaluation of patient's condition and review of old charts  The patient is on the cardiac monitor to evaluate for evidence of arrhythmia and/or significant heart rate changes.   MEDICATIONS ORDERED IN ED: Medications  sodium bicarbonate 150 mEq in sterile water 1,150 mL infusion ( Intravenous Hold 11/01/21 2015)  calcium gluconate 1 g/ 50 mL sodium chloride IVPB (0 mg Intravenous Hold 11/01/21 2053)  calcium gluconate 1 g/ 50 mL sodium chloride IVPB (0 mg Intravenous Stopped 11/01/21 1843)  sodium chloride 0.9 % bolus 1,000 mL (0 mLs Intravenous Stopped 11/01/21 1849)  insulin aspart (novoLOG) injection 10 Units (10 Units Intravenous Given 11/01/21 1935)  dextrose 50 % solution 25 g (25 g Intravenous Given 11/01/21 1936)  albuterol (PROVENTIL) (2.5 MG/3ML) 0.083% nebulizer solution 15 mg (15 mg Nebulization Given 11/01/21 1941)  sodium chloride 0.9 % bolus 1,000 mL (1,000 mLs Intravenous Bolus 11/01/21 1928)  ondansetron (ZOFRAN) injection 4 mg (4 mg Intravenous Given 11/01/21 1928)  furosemide (LASIX) injection 80 mg (80 mg Intravenous Given 11/01/21 1933)  sodium bicarbonate injection 50 mEq  (50 mEq Intravenous Given 11/01/21 1937)  sodium bicarbonate injection 50 mEq (50 mEq Intravenous Given 11/01/21 1936)  ceFEPIme (MAXIPIME) 2 g in sodium chloride 0.9 % 100 mL IVPB (0 g Intravenous Stopped 11/01/21 2053)  metroNIDAZOLE (FLAGYL) IVPB 500 mg (0 mg Intravenous Stopped 11/01/21 2135)  vancomycin (VANCOCIN) IVPB 1000 mg/200 mL premix (0 mg Intravenous Stopped 11/01/21 2135)     IMPRESSION / MDM / ASSESSMENT AND PLAN / ED COURSE  I reviewed the triage vital signs and the nursing notes.                              Differential diagnosis includes, but is not limited to, AKI, renal failure, electrolyte abnormality, complication of biliary drain, gastroenteritis  Is a 66 year old female with history of metastatic breast cancer on chemotherapy presents with nausea and vomiting.  On arrival I reviewed her EKG which shows peaked T waves and with the concern for nausea vomiting I was concerned for renal failure and hyperkalemia so she was given a gram of calcium gluconate empirically.  Patient looks dry but is nontoxic abdomen is mildly tender biliary drain seems to be working properly.  Her labs are concerning for a potassium of 7.3 nonhemolyzed creatinine is 4 from baseline around 0.9, he has an anion gap of 28 with a BUN of 97 and a bicarb of 9.  Glucose is 330 we will send a hydroxybutyrate but I suspect that the anion gap is in setting of uremia.  She also has a white count of 20 hemoglobin is 17.6 part of this could be hemoconcentration but will send lactate and cultures.  Will temporize the hyperkalemia with insulin dextrose albuterol bicarb and calcium.  Discussed with Dr. Candiss Norse with nephrology recommends another liter of normal saline 2 A of bicarb and a bicarb drip.  To repeat the potassium after meds are given to see which way it is going.  If downtrending patient will not need dialysis.   Patient's uric acid is elevated at 12.7 Phos is also elevated.  Patient has solid tumor of breast  cancer and no new recent chemotherapy so she is at less risk for tumor lysis syndrome other labs to be consistent with this.  Brought this up with Dr. Candiss Norse who noted that treatment would be the same.  Repeat potassium after all of the medication is 5.4.  I let Dr. Candiss Norse know.  Patient also had lactate of 4 with leukocytosis tachycardia meeting sepsis criteria.  Unclear of the source as UA is negative chest x-ray does not show pneumonia and CT abdomen pelvis not show any obvious source.  We will cover broadly.  Blood culture sent.  Given vancomycin and cefepime and Flagyl.  Has already received fluids.   FINAL CLINICAL IMPRESSION(S) / ED DIAGNOSES   Final diagnoses:  Hyperkalemia  Acute renal failure, unspecified acute renal failure type (HCC)  Sepsis, due to unspecified organism, unspecified whether acute organ dysfunction present [facility])     Rx / DC Orders   ED Discharge Orders     None        Note:  This document was prepared using Dragon voice recognition software and may include unintentional dictation errors.   Rada Hay, MD 11/01/21 2049    Rada Hay, MD 11/01/21 2142

## 2021-11-01 NOTE — Progress Notes (Signed)
Pharmacy Antibiotic Note  Diamond Hinton is a 66 y.o. female admitted on 11/01/2021 with sepsis.  Pharmacy has been consulted for Vancomycin , Cefepime  dosing.  Pt appears to be AKI; SrCr has tripled from baseline in past week.  Will dose by vanc levels till CrCl is stable.   Plan: Cefepime 2 gm IV X 1 given in ED on 5/23 @ 2019. Cefepime 2 gm IV Q24H ordered to continue on 5/24 @ 2000.  Vancomycin 1 gm IV X 1 given in ED on 5/23 @ 2021. Will draw random vanc on 5/24 @ 2000.   Height: '5\' 3"'$  (160 cm) Weight: 55.3 kg (122 lb) IBW/kg (Calculated) : 52.4  Temp (24hrs), Avg:97 F (36.1 C), Min:97 F (36.1 C), Max:97 F (36.1 C)  Recent Labs  Lab 11/01/21 1653 11/01/21 1827 11/01/21 2048  WBC 20.7*  --   --   CREATININE 4.11*  --  3.50*  LATICACIDVEN  --  4.1* 4.0*    Estimated Creatinine Clearance: 12.9 mL/min (A) (by C-G formula based on SCr of 3.5 mg/dL (H)).    Allergies  Allergen Reactions   Peanuts [Peanut Oil] Shortness Of Breath and Itching    Antimicrobials this admission:   >>    >>   Dose adjustments this admission:   Microbiology results:  BCx:   UCx:    Sputum:    MRSA PCR:   Thank you for allowing pharmacy to be a part of this patient's care.  Diamond Hinton D 11/01/2021 11:02 PM

## 2021-11-01 NOTE — Hospital Course (Signed)
Ms. 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  Initial vitals in the emergency department showed temperature of 97, respiration rate of 16, heart rate of 106, blood pressure 115/84, SPO2 of 95% on room air.  Serum sodium is 121, potassium 7.3, chloride 92, bicarb of 9, BUN of 97, serum creatinine of 4.11, nonfasting blood glucose 330, repeat BMP showed serum sodium 130, potassium 5.4, chloride 98, bicarb 15, BUN 99, serum creatinine of 3.50, GFR of 14.  WBC was 20.7, hemoglobin 17.6, platelets of 736.  ED treatment: Albuterol nebulizer with 15 mg, D50 IV, furosemide 80 mg IV, insulin aspart 10 units, ondansetron 4 mg IV, 2 doses of sodium bicarb 50 mill equivalent, 2 dosescalcium gluconate 1 g IV, sodium chloride 2 L bolus, additional sodium bicarb 150 mill equivalent in water infusion, vancomycin 1 g IV, cefepime, metronidazole.

## 2021-11-01 NOTE — Telephone Encounter (Signed)
Patient called reporting htat she is having nausea and vomited this morning. [place] that her medicine is not working and iss asking what she needs to do, ? Go to Er or what. Please advise

## 2021-11-01 NOTE — Telephone Encounter (Signed)
Smc tomorrow

## 2021-11-02 ENCOUNTER — Telehealth: Payer: Self-pay | Admitting: Oncology

## 2021-11-02 ENCOUNTER — Telehealth: Payer: Self-pay | Admitting: Pharmacy Technician

## 2021-11-02 ENCOUNTER — Encounter: Payer: Self-pay | Admitting: Oncology

## 2021-11-02 ENCOUNTER — Other Ambulatory Visit (HOSPITAL_COMMUNITY): Payer: Self-pay

## 2021-11-02 DIAGNOSIS — C50912 Malignant neoplasm of unspecified site of left female breast: Secondary | ICD-10-CM | POA: Diagnosis not present

## 2021-11-02 DIAGNOSIS — I152 Hypertension secondary to endocrine disorders: Secondary | ICD-10-CM

## 2021-11-02 DIAGNOSIS — Z833 Family history of diabetes mellitus: Secondary | ICD-10-CM | POA: Diagnosis not present

## 2021-11-02 DIAGNOSIS — E785 Hyperlipidemia, unspecified: Secondary | ICD-10-CM | POA: Diagnosis not present

## 2021-11-02 DIAGNOSIS — Z91010 Allergy to peanuts: Secondary | ICD-10-CM | POA: Diagnosis not present

## 2021-11-02 DIAGNOSIS — Z8249 Family history of ischemic heart disease and other diseases of the circulatory system: Secondary | ICD-10-CM | POA: Diagnosis not present

## 2021-11-02 DIAGNOSIS — E876 Hypokalemia: Secondary | ICD-10-CM | POA: Diagnosis not present

## 2021-11-02 DIAGNOSIS — E875 Hyperkalemia: Secondary | ICD-10-CM | POA: Diagnosis not present

## 2021-11-02 DIAGNOSIS — Z79899 Other long term (current) drug therapy: Secondary | ICD-10-CM | POA: Diagnosis not present

## 2021-11-02 DIAGNOSIS — E871 Hypo-osmolality and hyponatremia: Secondary | ICD-10-CM | POA: Diagnosis not present

## 2021-11-02 DIAGNOSIS — R630 Anorexia: Secondary | ICD-10-CM | POA: Diagnosis not present

## 2021-11-02 DIAGNOSIS — C50919 Malignant neoplasm of unspecified site of unspecified female breast: Secondary | ICD-10-CM | POA: Diagnosis not present

## 2021-11-02 DIAGNOSIS — E869 Volume depletion, unspecified: Secondary | ICD-10-CM | POA: Diagnosis not present

## 2021-11-02 DIAGNOSIS — N1831 Chronic kidney disease, stage 3a: Secondary | ICD-10-CM | POA: Diagnosis not present

## 2021-11-02 DIAGNOSIS — N179 Acute kidney failure, unspecified: Secondary | ICD-10-CM

## 2021-11-02 DIAGNOSIS — I129 Hypertensive chronic kidney disease with stage 1 through stage 4 chronic kidney disease, or unspecified chronic kidney disease: Secondary | ICD-10-CM | POA: Diagnosis not present

## 2021-11-02 DIAGNOSIS — A419 Sepsis, unspecified organism: Secondary | ICD-10-CM

## 2021-11-02 DIAGNOSIS — E1169 Type 2 diabetes mellitus with other specified complication: Secondary | ICD-10-CM

## 2021-11-02 DIAGNOSIS — K219 Gastro-esophageal reflux disease without esophagitis: Secondary | ICD-10-CM | POA: Diagnosis not present

## 2021-11-02 DIAGNOSIS — E1159 Type 2 diabetes mellitus with other circulatory complications: Secondary | ICD-10-CM | POA: Diagnosis not present

## 2021-11-02 DIAGNOSIS — R6520 Severe sepsis without septic shock: Secondary | ICD-10-CM | POA: Diagnosis not present

## 2021-11-02 DIAGNOSIS — F419 Anxiety disorder, unspecified: Secondary | ICD-10-CM | POA: Diagnosis not present

## 2021-11-02 DIAGNOSIS — Z9049 Acquired absence of other specified parts of digestive tract: Secondary | ICD-10-CM | POA: Diagnosis not present

## 2021-11-02 DIAGNOSIS — E1122 Type 2 diabetes mellitus with diabetic chronic kidney disease: Secondary | ICD-10-CM | POA: Diagnosis not present

## 2021-11-02 DIAGNOSIS — E039 Hypothyroidism, unspecified: Secondary | ICD-10-CM | POA: Diagnosis not present

## 2021-11-02 DIAGNOSIS — C784 Secondary malignant neoplasm of small intestine: Secondary | ICD-10-CM | POA: Diagnosis not present

## 2021-11-02 DIAGNOSIS — K311 Adult hypertrophic pyloric stenosis: Secondary | ICD-10-CM | POA: Diagnosis not present

## 2021-11-02 DIAGNOSIS — E8721 Acute metabolic acidosis: Secondary | ICD-10-CM | POA: Diagnosis not present

## 2021-11-02 DIAGNOSIS — M7989 Other specified soft tissue disorders: Secondary | ICD-10-CM | POA: Diagnosis not present

## 2021-11-02 DIAGNOSIS — N182 Chronic kidney disease, stage 2 (mild): Secondary | ICD-10-CM | POA: Diagnosis not present

## 2021-11-02 DIAGNOSIS — Z170 Estrogen receptor positive status [ER+]: Secondary | ICD-10-CM

## 2021-11-02 DIAGNOSIS — Z79890 Hormone replacement therapy (postmenopausal): Secondary | ICD-10-CM | POA: Diagnosis not present

## 2021-11-02 DIAGNOSIS — E883 Tumor lysis syndrome: Secondary | ICD-10-CM | POA: Diagnosis not present

## 2021-11-02 LAB — COMPREHENSIVE METABOLIC PANEL
ALT: 27 U/L (ref 0–44)
AST: 29 U/L (ref 15–41)
Albumin: 3.8 g/dL (ref 3.5–5.0)
Alkaline Phosphatase: 95 U/L (ref 38–126)
Anion gap: 13 (ref 5–15)
BUN: 89 mg/dL — ABNORMAL HIGH (ref 8–23)
CO2: 19 mmol/L — ABNORMAL LOW (ref 22–32)
Calcium: 9.5 mg/dL (ref 8.9–10.3)
Chloride: 102 mmol/L (ref 98–111)
Creatinine, Ser: 2.98 mg/dL — ABNORMAL HIGH (ref 0.44–1.00)
GFR, Estimated: 17 mL/min — ABNORMAL LOW (ref >60)
Glucose, Bld: 166 mg/dL — ABNORMAL HIGH (ref 70–99)
Potassium: 4.7 mmol/L (ref 3.5–5.1)
Sodium: 134 mmol/L — ABNORMAL LOW (ref 135–145)
Total Bilirubin: 0.8 mg/dL (ref 0.3–1.2)
Total Protein: 6.8 g/dL (ref 6.5–8.1)

## 2021-11-02 LAB — CBC
HCT: 37.8 % (ref 36.0–46.0)
Hemoglobin: 13 g/dL (ref 12.0–15.0)
MCH: 30.1 pg (ref 26.0–34.0)
MCHC: 34.4 g/dL (ref 30.0–36.0)
MCV: 87.5 fL (ref 80.0–100.0)
Platelets: 331 10*3/uL (ref 150–400)
RBC: 4.32 MIL/uL (ref 3.87–5.11)
RDW: 14.2 % (ref 11.5–15.5)
WBC: 17.4 10*3/uL — ABNORMAL HIGH (ref 4.0–10.5)
nRBC: 0 % (ref 0.0–0.2)

## 2021-11-02 LAB — MAGNESIUM: Magnesium: 1.9 mg/dL (ref 1.7–2.4)

## 2021-11-02 LAB — PHOSPHORUS: Phosphorus: 5.5 mg/dL — ABNORMAL HIGH (ref 2.5–4.6)

## 2021-11-02 LAB — RASBURICASE - URIC ACID: Uric Acid, Serum: 3.2 mg/dL (ref 2.5–7.1)

## 2021-11-02 LAB — GLUCOSE, CAPILLARY: Glucose-Capillary: 116 mg/dL — ABNORMAL HIGH (ref 70–99)

## 2021-11-02 LAB — CK: Total CK: 17 U/L — ABNORMAL LOW (ref 38–234)

## 2021-11-02 LAB — LACTIC ACID, PLASMA
Lactic Acid, Venous: 2.6 mmol/L (ref 0.5–1.9)
Lactic Acid, Venous: 2.5 mmol/L (ref 0.5–1.9)

## 2021-11-02 LAB — CBG MONITORING, ED
Glucose-Capillary: 143 mg/dL — ABNORMAL HIGH (ref 70–99)
Glucose-Capillary: 124 mg/dL — ABNORMAL HIGH (ref 70–99)

## 2021-11-02 MED ORDER — ACETAMINOPHEN 325 MG PO TABS: 650.0000 mg | ORAL_TABLET | Freq: Four times a day (QID) | ORAL | Status: DC | PRN

## 2021-11-02 MED ORDER — ENSURE ENLIVE PO LIQD
237.0000 mL | Freq: Three times a day (TID) | ORAL | Status: DC
  Administered 2021-11-02 – 2021-11-03 (×2): 237 mL via ORAL

## 2021-11-02 MED ORDER — SODIUM CHLORIDE 0.9 % IV SOLN
INTRAVENOUS | Status: AC
Start: 2021-11-02 — End: 2021-11-03

## 2021-11-02 MED ORDER — INSULIN ASPART 100 UNIT/ML IJ SOLN: 0.0000 [IU] | Freq: Every day | INTRAMUSCULAR | Status: DC

## 2021-11-02 MED ORDER — HEPARIN SODIUM (PORCINE) 5000 UNIT/ML IJ SOLN
5000.0000 [IU] | Freq: Three times a day (TID) | INTRAMUSCULAR | Status: DC
  Administered 2021-11-02 – 2021-11-03 (×4): 5000 [IU] via SUBCUTANEOUS
  Filled 2021-11-02 (×5): qty 1

## 2021-11-02 MED ORDER — TRAZODONE HCL 50 MG PO TABS: 50.0000 mg | ORAL_TABLET | Freq: Every evening | ORAL | Status: DC | PRN

## 2021-11-02 MED ORDER — ONDANSETRON HCL 4 MG/2ML IJ SOLN: 4.0000 mg | Freq: Four times a day (QID) | INTRAMUSCULAR | Status: DC | PRN

## 2021-11-02 MED ORDER — ACETAMINOPHEN 650 MG RE SUPP: 650.0000 mg | Freq: Four times a day (QID) | RECTAL | Status: DC | PRN

## 2021-11-02 MED ORDER — ADULT MULTIVITAMIN W/MINERALS CH
1.0000 | ORAL_TABLET | Freq: Every day | ORAL | Status: DC
  Administered 2021-11-02 – 2021-11-03 (×2): 1 via ORAL
  Filled 2021-11-02 (×2): qty 1

## 2021-11-02 MED ORDER — PANTOPRAZOLE SODIUM 40 MG PO TBEC
40.0000 mg | DELAYED_RELEASE_TABLET | Freq: Every day | ORAL | Status: DC
Start: 2021-11-02 — End: 2021-11-03
  Administered 2021-11-02 – 2021-11-03 (×2): 40 mg via ORAL
  Filled 2021-11-02 (×2): qty 1

## 2021-11-02 MED ORDER — LETROZOLE 2.5 MG PO TABS
2.5000 mg | ORAL_TABLET | Freq: Every day | ORAL | Status: DC
  Administered 2021-11-02 – 2021-11-03 (×2): 2.5 mg via ORAL
  Filled 2021-11-02 (×2): qty 1

## 2021-11-02 MED ORDER — LEVOTHYROXINE SODIUM 88 MCG PO TABS
88.0000 ug | ORAL_TABLET | Freq: Every day | ORAL | Status: DC
  Administered 2021-11-02 – 2021-11-03 (×2): 88 ug via ORAL
  Filled 2021-11-02 (×2): qty 1

## 2021-11-02 MED ORDER — ONDANSETRON HCL 4 MG PO TABS: 4.0000 mg | ORAL_TABLET | Freq: Four times a day (QID) | ORAL | Status: DC | PRN

## 2021-11-02 MED ORDER — INSULIN ASPART 100 UNIT/ML IJ SOLN
0.0000 [IU] | Freq: Three times a day (TID) | INTRAMUSCULAR | Status: DC
  Administered 2021-11-02 – 2021-11-03 (×2): 1 [IU] via SUBCUTANEOUS
  Filled 2021-11-02 (×3): qty 1

## 2021-11-02 NOTE — Progress Notes (Addendum)
PROGRESS NOTE  Diamond Hinton    DOB: 24-Jun-1955, 66 y.o.  ONG:[id number]    Code Status: Full Code   DOA: 11/01/2021   LOS: 0   Brief hospital course  Diamond Hinton is a 66 y.o. 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  They presented from home to the ED on 11/01/2021 with nausea and vomiting x 1 days.  Of note, patient was taking potassium supplementation at home.  She is currently undergoing chemotherapy for her breast cancer.  In the ED, it was found that they had stable vital signs with exception of mild tachycardia of 106.  Significant findings included sodium 121, potassium 7.3,chloride 92, bicarb of 9, BUN of 97, serum creatinine of 4.11, nonfasting blood glucose 330, repeat BMP showed serum sodium 130, potassium 5.4, chloride 98, bicarb 15, BUN 99, serum creatinine of 3.50, GFR of 14.  WBC 20.7, hemoglobin 17.6, platelets 736.  They were initially treated with Albuterol nebulizer with 15 mg, D50 IV, furosemide 80 mg IV, insulin aspart 10 units, ondansetron 4 mg IV, 2 doses of sodium bicarb 50 mill equivalent, 2 doses calcium gluconate 1 g IV, sodium chloride 2 L bolus, additional sodium bicarb 150 mill equivalent in water infusion, vancomycin 1 g IV, cefepime, metronidazole.   Patient was admitted to medicine service for further workup and management of tumor lysis syndrome as outlined in detail below.  11/02/21 -stable, improved  Assessment & Plan  Principal Problem:   Tumor lysis syndrome Active Problems:   Severe sepsis (HCC)   AKI (acute kidney injury) ([place])   Adult hypothyroidism   Hypertension associated with diabetes ([place])   Gastric outlet obstruction   Primary malignant neoplasm of breast with metastasis ([place])   Hyperlipidemia associated with type 2 diabetes mellitus ([place])   Hyperkalemia  Tumor lysis syndrome- improved. S/p rasburicase. Uric acid 12.7>3.2 -  oncology has been consulted on admission. Awaiting further recommendations   Severe sepsis- sepsis criteria have resolved. No clear source of infection. S/p flagyl, vancomycin, cefepime. LA improved 4.1>2.6. vitals stable. - f/u BxCx - continue IV fluids, f/u repeat LA.     AKI- improving. Cr 4.11>2.98 - CMP in the a.m.    Adult hypothyroidism - Levothyroxine 88 mcg daily resumed   Hyperkalemia- resolved. K+ 7.3>4.7 Status post albuterol 50 mg, D50, insulin 10 units aspart, 2 doses of sodium bicarb 50 mill equivalent, 2 doses of calcium gluconate 1 g IV, sodium chloride 2 L bolus, furosemide 80 mg IV one-time dose. Likely 2/2 the K+ supplement patient taking at home for history of hypokalemia. - Nephrology consulted by EDP, we appreciate further recommendations and assistance - Check BMP in the a.m.  Primary malignant neoplasm of breast with neoplasm-  - continue therapy per oncology  Body mass index is 21.61 kg/m.  VTE ppx: heparin injection 5,000 Units Start: 11/02/21 0600 Place TED hose Start: 11/02/21 0151   Diet:     Diet   Diet renal/carb modified with fluid restriction Diet-HS Snack? Nothing; Fluid restriction: 1200 mL Fluid; Room service appropriate? Yes; Fluid consistency: Thin   Consultants: Heme/onc nephrology Subjective 11/02/21    Pt reports feeling much improved since admission.    Objective   Vitals:   11/02/21 0500 11/02/21 0530 11/02/21 0600 11/02/21 0630  BP: 117/68 117/68 115/60 109/65  Pulse: 84 79 84 80  Resp: (!) '22 19 17 17  '$ Temp:  TempSrc:      SpO2: 98% 99% 97% 98%  Weight:      Height:        Intake/Output Summary (Last 24 hours) at 11/02/2021 0827 Last data filed at 11/02/2021 3235 Gross per 24 hour  Intake --  Output 300 ml  Net -300 ml   Filed Weights   11/01/21 1649  Weight: 55.3 kg     Physical Exam:  General: awake, alert, NAD HEENT: atraumatic, clear conjunctiva, anicteric sclera, MMM, hearing grossly  normal Respiratory: normal respiratory effort. Cardiovascular: quick capillary refill, normal S1/S2, RRR, no JVD, murmurs Gastrointestinal: soft, NT, ND Nervous: A&O x3. no gross focal neurologic deficits, normal speech Extremities: moves all equally, no edema, normal tone Skin: dry, intact, normal temperature, normal color. No rashes, lesions or ulcers on exposed skin Psychiatry: normal mood, congruent affect  Labs   I have personally reviewed the following labs and imaging studies CBC    Component Value Date/Time   WBC 17.4 (H) 11/02/2021 0522   RBC 4.32 11/02/2021 0522   HGB 13.0 11/02/2021 0522   HGB 15.4 12/01/2015 0829   HCT 37.8 11/02/2021 0522   HCT 43.6 12/01/2015 0829   PLT 331 11/02/2021 0522   PLT 258 12/01/2015 0829   MCV 87.5 11/02/2021 0522   MCV 85 12/01/2015 0829   MCH 30.1 11/02/2021 0522   MCHC 34.4 11/02/2021 0522   RDW 14.2 11/02/2021 0522   RDW 13.2 12/01/2015 0829   LYMPHSABS 2.2 10/19/2021 1350   LYMPHSABS 2.4 12/01/2015 0829   MONOABS 0.6 10/19/2021 1350   EOSABS 0.1 10/19/2021 1350   EOSABS 0.1 12/01/2015 0829   BASOSABS 0.1 10/19/2021 1350   BASOSABS 0.0 12/01/2015 0829      Latest Ref Rng & Units 11/02/2021    5:22 AM 11/01/2021    8:48 PM 11/01/2021    4:53 PM  BMP  Glucose 70 - 99 mg/dL 166   281   330    BUN 8 - 23 mg/dL 89   99   97    Creatinine 0.44 - 1.00 mg/dL 2.98   3.50   4.11    Sodium 135 - 145 mmol/L 134   130   121    Potassium 3.5 - 5.1 mmol/L 4.7   5.4   7.3    Chloride 98 - 111 mmol/L 102   98   92    CO2 22 - 32 mmol/L '19   15   9    '$ Calcium 8.9 - 10.3 mg/dL 9.5   10.0   11.5      CT ABDOMEN PELVIS WO CONTRAST  Result Date: 11/01/2021 CLINICAL DATA:  Nausea vomiting EXAM: CT ABDOMEN AND PELVIS WITHOUT CONTRAST TECHNIQUE: Multidetector CT imaging of the abdomen and pelvis was performed following the standard protocol without IV contrast. RADIATION DOSE REDUCTION: This exam was performed according to the departmental  dose-optimization program which includes automated exposure control, adjustment of the mA and/or kV according to patient size and/or use of iterative reconstruction technique. COMPARISON:  CT 10/07/2021, 08/29/2021 FINDINGS: Lower chest: Improved aeration at the lung bases with residual scarring. No acute consolidation or pleural effusion. Coronary vascular calcification. Mildly irregular density within the inferior left breast as before. Hepatobiliary: Internal external biliary drainage catheter with pigtail in the second portion of the duodenum. Status post cholecystectomy. No significant biliary dilatation. Pancreas: Unremarkable. No pancreatic ductal dilatation or surrounding inflammatory changes. Spleen: Normal in size without focal abnormality. Adrenals/Urinary Tract: Adrenal glands are  normal. Kidneys show no hydronephrosis. The bladder is unremarkable Stomach/Bowel: Postsurgical changes of the stomach consistent with gastro jejunostomy. No obstructive features. Soft tissue thickening at the second portion of duodenum as before. Remainder of the small bowel is nondistended. Moderate to large stool burden. Negative appendix. Diverticular disease of the left colon Vascular/Lymphatic: Advanced aortic atherosclerosis. No aneurysm. No suspicious lymph node Reproductive: Uterus and bilateral adnexa are unremarkable. Other: Negative for pelvic effusion or free air Musculoskeletal: Degenerative changes of the spine. No acute osseous abnormality IMPRESSION: 1. No evidence for bowel obstruction on the current exam. No evidence for new bowel wall thickening. Status post gastrojejunostomy without adverse features at this time. Redemonstrated soft tissue thickening/abnormality in the region of the second portion of duodenum 2. Similar positioning of internal external biliary drainage catheter without significant biliary dilatation 3. Redemonstrated mild irregular density at the inferior left breast for which outpatient  mammography and ultrasound was previously advised Electronically Signed   By: Donavan Foil M.D.   On: 11/01/2021 18:58   US Venous Img Lower Bilateral (DVT)  Result Date: 11/02/2021 CLINICAL DATA:  Leg swelling x1 day EXAM: BILATERAL LOWER EXTREMITY VENOUS DOPPLER ULTRASOUND TECHNIQUE: Gray-scale sonography with compression, as well as color and duplex ultrasound, were performed to evaluate the deep venous system(s) from the level of the common femoral vein through the popliteal and proximal calf veins. COMPARISON:  None Available. FINDINGS: VENOUS Normal compressibility of the common femoral, superficial femoral, and popliteal veins, as well as the visualized calf veins. Visualized portions of profunda femoral vein and great saphenous vein unremarkable. No filling defects to suggest DVT on grayscale or color Doppler imaging. Doppler waveforms show normal direction of venous flow, normal respiratory plasticity and response to augmentation. OTHER None. Limitations: none IMPRESSION: Negative. Electronically Signed   By: Julian Hy M.D.   On: 11/02/2021 00:16   DG Chest Portable 1 View  Result Date: 11/01/2021 CLINICAL DATA:  Provided history: Shortness of breath. Additional history provided: Patient reports nausea/vomiting. History of breast cancer and hypertension. EXAM: PORTABLE CHEST 1 VIEW COMPARISON:  Same day CT abdomen/pelvis 11/01/2021. Prior chest radiographs 10/07/2021 and earlier. FINDINGS: A small portion of the left lateral costophrenic angle is excluded from the field of view. Heart size within normal limits. Foci of linear atelectasis versus scarring within the bilateral lung bases. No appreciable airspace consolidation or pulmonary edema. No evidence of pleural effusion or pneumothorax. No acute bony abnormality identified. IMPRESSION: A small portion of the left lateral costophrenic angle is excluded from the field of view. Foci of linear atelectasis versus scarring within the  bilateral lung bases. No appreciable airspace consolidation or pulmonary edema. Electronically Signed   By: Kellie Simmering D.O.   On: 11/01/2021 18:56    Disposition Plan & Communication  Patient status: Observation  Admitted From: Home Planned disposition location: Home Anticipated discharge date: 5/25 pending oncology evaluation and clearance of tumor lysis syndrome, AKI  Family Communication: none    Author: Richarda Osmond, DO [facility] 11/02/2021, 8:27 AM   Available by Epic secure chat 7AM-7PM. If 7PM-7AM, please contact night-coverage.  [facility] contact information found on [email or web address].

## 2021-11-02 NOTE — Telephone Encounter (Signed)
Pt daughter, [name], called and stated the pt was in the hospital and wanted to know if she could cancel tomorrows appts or reschedule them for another time. She requested someone give her a call back.

## 2021-11-02 NOTE — ED Notes (Signed)
Breakfast tray delivered

## 2021-11-02 NOTE — Consult Note (Signed)
[facility]  CONSULT NOTE    Date: 11/02/2021                  Patient Name:  Diamond Hinton  MRN: [id number]  DOB: 1956/03/29  Age / Sex: 66 y.o., female         PCP: Virginia Crews, MD                 Service Requesting Consult: [facility]                 Reason for Consult: Acute kidney injury            History of Present Illness: Ms. Diamond Hinton is a 66 y.o.  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  Patient states that she is prescribed Zofran and Compazine from her oncologist to manage the symptoms.  Patient states that with taking both medications, she was not relieved from symptoms.  She states whenever she stood she would vomit.  Due to this, appetite poor but has tried to maintain liquid hydration.  Denies diarrhea.  Denies NSAID use during this time.  Has continued to take medications as prescribed.  Denies chest pain or discomfort.  Remains on room air.  Labs on ED arrival include sodium 121, potassium 7.3, bicarb 903 30, BUN 97, creatinine 4.11 with GFR 11,, anion gap 20 lipase 271 elevated WBCs 20.7.  UA appears hazy with glucose present.  CT abdomen pelvis negative for bowel obstruction and positive for known inferior left breast density.Chest x-ray negative for pulmonary edema DVT ultrasounds negative.   Medications: Outpatient medications: (Not in a hospital admission)   Current medications: Current Facility-Administered Medications  Medication Dose Route Frequency Provider Last Rate Last Admin   0.9 %  sodium chloride infusion   Intravenous Continuous Cox, Amy N, DO 125 mL/hr at 11/02/21 0759 New Bag at 11/02/21 0759   acetaminophen  (TYLENOL) tablet 650 mg  650 mg Oral Q6H PRN Cox, Amy N, DO       Or   acetaminophen (TYLENOL) suppository 650 mg  650 mg Rectal Q6H PRN Cox, Amy N, DO       feeding supplement (ENSURE ENLIVE / ENSURE PLUS) liquid 237 mL  237 mL Oral TID BM Cox, Amy N, DO       heparin injection 5,000 Units  5,000 Units Subcutaneous Q8H Cox, Amy N, DO   5,000 Units at 11/02/21 0759   insulin aspart (novoLOG) injection 0-5 Units  0-5 Units Subcutaneous QHS Cox, Amy N, DO       insulin aspart (novoLOG) injection 0-9 Units  0-9 Units Subcutaneous TID WC Cox, Amy N, DO   1 Units at 11/02/21 0759   letrozole [facility]) tablet 2.5 mg  2.5 mg Oral Daily Cox, Amy N, DO   2.5 mg at 11/02/21 1138   levothyroxine (SYNTHROID) tablet 88 mcg  88 mcg Oral Q0600 Cox, Amy N, DO   88 mcg at 11/02/21 0522   multivitamin with minerals tablet 1 tablet  1 tablet Oral Daily Cox, Amy N, DO   1 tablet at 11/02/21 1138   ondansetron (ZOFRAN) tablet 4 mg  4 mg Oral Q6H PRN Cox, Amy  N, DO       Or   ondansetron (ZOFRAN) injection 4 mg  4 mg Intravenous Q6H PRN Cox, Amy N, DO       pantoprazole (PROTONIX) EC tablet 40 mg  40 mg Oral Daily Cox, Amy N, DO   40 mg at 11/02/21 1139   traZODone (DESYREL) tablet 50 mg  50 mg Oral QHS PRN Cox, Amy N, DO       Current Outpatient Medications  Medication Sig Dispense Refill   acetaminophen (TYLENOL) 325 MG tablet Take 325 mg by mouth every 6 (six) hours as needed (for pain and sometimes takes 2 if pain is worse).     feeding supplement (ENSURE ENLIVE / ENSURE PLUS) LIQD Take 237 mLs by mouth 3 (three) times daily between meals. 237 mL 12   letrozole (FEMARA) 2.5 MG tablet Take 1 tablet (2.5 mg total) by mouth daily. 30 tablet 3   levothyroxine (SYNTHROID) 88 MCG tablet Take 1 tablet (88 mcg total) by mouth daily. 90 tablet 1   Multiple Vitamins-Minerals (CENTRUM VITAMINTS PO) Take 1 tablet by mouth daily.     ondansetron (ZOFRAN) 4 MG tablet Take 1 tablet (4 mg total) by mouth daily as needed for  nausea or vomiting. 30 tablet 1   pantoprazole (PROTONIX) 40 MG tablet Take 1 tablet (40 mg total) by mouth daily. 30 tablet 11   potassium chloride (KLOR-CON) 10 MEQ tablet Take 20 mEq by mouth daily.     prochlorperazine (COMPAZINE) 5 MG tablet Take 1 tablet (5 mg total) by mouth every 6 (six) hours as needed for refractory nausea / vomiting. 30 tablet 1   ribociclib succ (KISQALI '400MG'$  DAILY DOSE) 200 MG Therapy Pack Take 2 tablets (400 mg total) by mouth daily. Take for 21 days on, 7 days off, repeat every 28 days. 42 tablet 1   Simethicone (GAS-X PO) Take by mouth. PRN     traZODone (DESYREL) 50 MG tablet Take 1 tablet (50 mg total) by mouth at bedtime as needed for sleep. 30 tablet 0      Allergies: Allergies  Allergen Reactions   Peanuts [Peanut Oil] Shortness Of Breath and Itching      Past Medical History: Past Medical History:  Diagnosis Date   Anemia    Anxiety    Breast cancer ([place])    Gallstones    GERD (gastroesophageal reflux disease)    Hyperlipidemia    Hypertension    Jaundice 08/29/2021   Rash 08/29/2021   Right upper quadrant abdominal pain 08/29/2021   UTI (urinary tract infection) 08/29/2021     Past Surgical History: Past Surgical History:  Procedure Laterality Date   BILATERAL SALPINGECTOMY  04/03/1997   CHOLECYSTECTOMY N/A 07/07/2015   Procedure: LAPAROSCOPIC CHOLECYSTECTOMY ;  Surgeon: Jules Husbands, MD;  Location: [facility] ORS;  Service: General;  Laterality: N/A;   ESOPHAGOGASTRODUODENOSCOPY  08/30/2021   Procedure: ESOPHAGOGASTRODUODENOSCOPY (EGD);  Surgeon: Lucilla Lame, MD;  Location: [facility];  Service: Endoscopy;;   GALLBLADDER SURGERY  07/07/2015   [facility]   IR BILIARY DRAIN PLACEMENT WITH CHOLANGIOGRAM  08/31/2021   IR CONVERT BILIARY DRAIN TO INT EXT BILIARY DRAIN  09/02/2021   IR EXCHANGE BILIARY DRAIN  10/10/2021     Family History: Family History  Problem Relation Age of Onset   Hypertension Mother    CVA Mother    Ulcers Mother     Emphysema Mother    Heart disease Father    Prostate cancer Father  Macular degeneration Father    Hypertension Sister    Diabetes Sister    Cervical polyp Sister    Hypertension Sister    Diabetes Sister    Lymphoma Sister      Social History: Social History   Socioeconomic History   Marital status: Married    Spouse name: Not on file   Number of children: Not on file   Years of education: Not on file   Highest education level: Not on file  Occupational History   Not on file  Tobacco Use   Smoking status: Never   Smokeless tobacco: Never  Vaping Use   Vaping Use: Never used  Substance and Sexual Activity   Alcohol use: No   Drug use: No   Sexual activity: Yes  Other Topics Concern   Not on file  Social History Narrative   Not on file   Social Determinants of Health   Financial Resource Strain: Not on file  Food Insecurity: Not on file  Transportation Needs: Not on file  Physical Activity: Not on file  Stress: Not on file  Social Connections: Not on file  Intimate Partner Violence: Not on file     Review of Systems: Review of Systems  Constitutional:  Negative for chills, fever and malaise/fatigue.  HENT:  Negative for congestion, sore throat and tinnitus.   Eyes:  Negative for blurred vision and redness.  Respiratory:  Negative for cough, shortness of breath and wheezing.   Cardiovascular:  Negative for chest pain, palpitations, claudication and leg swelling.  Gastrointestinal:  Positive for nausea and vomiting. Negative for abdominal pain, blood in stool and diarrhea.  Genitourinary:  Negative for flank pain, frequency and hematuria.  Musculoskeletal:  Negative for back pain, falls and myalgias.  Skin:  Negative for rash.  Neurological:  Negative for dizziness, weakness and headaches.  Endo/Heme/Allergies:  Does not bruise/bleed easily.  Psychiatric/Behavioral:  Negative for depression. The patient is not nervous/anxious and does not have insomnia.     Vital Signs: Blood pressure 117/66, pulse 77, temperature (!) 97 F (36.1 C), temperature source Axillary, resp. rate 19, height '5\' 3"'$  (1.6 m), weight 55.3 kg, SpO2 100 %.  Weight trends: Filed Weights   11/01/21 1649  Weight: 55.3 kg    Physical Exam: General: NAD  Head: Normocephalic, atraumatic. Moist oral mucosal membranes  Eyes: Anicteric  Lungs:  Clear to auscultation, normal effort  Heart: Regular rate and rhythm  Abdomen:  Soft, nontender  Extremities: No peripheral edema.  Neurologic: Nonfocal, moving all four extremities  Skin: No lesions  Access: None     Lab results: Basic Metabolic Panel: Recent Labs  Lab 11/01/21 1653 11/01/21 1847 11/01/21 2048 11/02/21 0522  NA 121*  --  130* 134*  K 7.3*  --  5.4* 4.7  CL 92*  --  98 102  CO2 9*  --  15* 19*  GLUCOSE 330*  --  281* 166*  BUN 97*  --  99* 89*  CREATININE 4.11*  --  3.50* 2.98*  CALCIUM 11.5*  --  10.0 9.5  MG  --  2.6*  --  1.9  PHOS  --  7.2*  --  5.5*    Liver Function Tests: Recent Labs  Lab 11/01/21 1653 11/02/21 0522  AST 44* 29  ALT 40 27  ALKPHOS 159* 95  BILITOT 1.3* 0.8  PROT 10.1* 6.8  ALBUMIN 5.5* 3.8   Recent Labs  Lab 11/01/21 1653  LIPASE 271*   No  results for input(s): AMMONIA in the last 168 hours.  CBC: Recent Labs  Lab 11/01/21 1653 11/02/21 0522  WBC 20.7* 17.4*  HGB 17.6* 13.0  HCT 52.3* 37.8  MCV 88.0 87.5  PLT 736* 331    Cardiac Enzymes: Recent Labs  Lab 11/02/21 0522  CKTOTAL 17*    BNP: Invalid input(s): POCBNP  CBG: Recent Labs  Lab 11/02/21 0750 11/02/21 1149  GLUCAP 143* 124*    Microbiology: Results for orders placed or performed during the hospital encounter of 11/01/21  Blood culture (routine x 2)     Status: None (Preliminary result)   Collection Time: 11/01/21  6:27 PM   Specimen: Left Antecubital; Blood  Result Value Ref Range Status   Specimen Description LEFT ANTECUBITAL  Final   Special Requests   Final     BOTTLES DRAWN AEROBIC AND ANAEROBIC Blood Culture adequate volume   Culture   Final    NO GROWTH < 12 HOURS Performed at [facility], [address], [place], [place] [address]    Report Status PENDING  Incomplete  Blood culture (routine x 2)     Status: None (Preliminary result)   Collection Time: 11/01/21  6:32 PM   Specimen: Left Antecubital; Blood  Result Value Ref Range Status   Specimen Description LEFT ANTECUBITAL  Final   Special Requests   Final    BOTTLES DRAWN AEROBIC AND ANAEROBIC Blood Culture adequate volume   Culture   Final    NO GROWTH < 12 HOURS Performed at [facility], [address] [place], [place] [address]    Report Status PENDING  Incomplete    Coagulation Studies: No results for input(s): LABPROT, INR in the last 72 hours.  Urinalysis: Recent Labs    11/01/21 1653  COLORURINE YELLOW*  LABSPEC 1.011  PHURINE 5.0  GLUCOSEU 150*  HGBUR NEGATIVE  BILIRUBINUR NEGATIVE  KETONESUR NEGATIVE  PROTEINUR NEGATIVE  NITRITE NEGATIVE  LEUKOCYTESUR NEGATIVE      Imaging: CT ABDOMEN PELVIS WO CONTRAST  Result Date: 11/01/2021 CLINICAL DATA:  Nausea vomiting EXAM: CT ABDOMEN AND PELVIS WITHOUT CONTRAST TECHNIQUE: Multidetector CT imaging of the abdomen and pelvis was performed following the standard protocol without IV contrast. RADIATION DOSE REDUCTION: This exam was performed according to the departmental dose-optimization program which includes automated exposure control, adjustment of the mA and/or kV according to patient size and/or use of iterative reconstruction technique. COMPARISON:  CT 10/07/2021, 08/29/2021 FINDINGS: Lower chest: Improved aeration at the lung bases with residual scarring. No acute consolidation or pleural effusion. Coronary vascular calcification. Mildly irregular density within the inferior left breast as before. Hepatobiliary: Internal external biliary drainage catheter with pigtail in the second portion of the  duodenum. Status post cholecystectomy. No significant biliary dilatation. Pancreas: Unremarkable. No pancreatic ductal dilatation or surrounding inflammatory changes. Spleen: Normal in size without focal abnormality. Adrenals/Urinary Tract: Adrenal glands are normal. Kidneys show no hydronephrosis. The bladder is unremarkable Stomach/Bowel: Postsurgical changes of the stomach consistent with gastro jejunostomy. No obstructive features. Soft tissue thickening at the second portion of duodenum as before. Remainder of the small bowel is nondistended. Moderate to large stool burden. Negative appendix. Diverticular disease of the left colon Vascular/Lymphatic: Advanced aortic atherosclerosis. No aneurysm. No suspicious lymph node Reproductive: Uterus and bilateral adnexa are unremarkable. Other: Negative for pelvic effusion or free air Musculoskeletal: Degenerative changes of the spine. No acute osseous abnormality IMPRESSION: 1. No evidence for bowel obstruction on the current exam. No evidence for new bowel  wall thickening. Status post gastrojejunostomy without adverse features at this time. Redemonstrated soft tissue thickening/abnormality in the region of the second portion of duodenum 2. Similar positioning of internal external biliary drainage catheter without significant biliary dilatation 3. Redemonstrated mild irregular density at the inferior left breast for which outpatient mammography and ultrasound was previously advised Electronically Signed   By: Donavan Foil M.D.   On: 11/01/2021 18:58   US Venous Img Lower Bilateral (DVT)  Result Date: 11/02/2021 CLINICAL DATA:  Leg swelling x1 day EXAM: BILATERAL LOWER EXTREMITY VENOUS DOPPLER ULTRASOUND TECHNIQUE: Gray-scale sonography with compression, as well as color and duplex ultrasound, were performed to evaluate the deep venous system(s) from the level of the common femoral vein through the popliteal and proximal calf veins. COMPARISON:  None Available.  FINDINGS: VENOUS Normal compressibility of the common femoral, superficial femoral, and popliteal veins, as well as the visualized calf veins. Visualized portions of profunda femoral vein and great saphenous vein unremarkable. No filling defects to suggest DVT on grayscale or color Doppler imaging. Doppler waveforms show normal direction of venous flow, normal respiratory plasticity and response to augmentation. OTHER None. Limitations: none IMPRESSION: Negative. Electronically Signed   By: Julian Hy M.D.   On: 11/02/2021 00:16   DG Chest Portable 1 View  Result Date: 11/01/2021 CLINICAL DATA:  Provided history: Shortness of breath. Additional history provided: Patient reports nausea/vomiting. History of breast cancer and hypertension. EXAM: PORTABLE CHEST 1 VIEW COMPARISON:  Same day CT abdomen/pelvis 11/01/2021. Prior chest radiographs 10/07/2021 and earlier. FINDINGS: A small portion of the left lateral costophrenic angle is excluded from the field of view. Heart size within normal limits. Foci of linear atelectasis versus scarring within the bilateral lung bases. No appreciable airspace consolidation or pulmonary edema. No evidence of pleural effusion or pneumothorax. No acute bony abnormality identified. IMPRESSION: A small portion of the left lateral costophrenic angle is excluded from the field of view. Foci of linear atelectasis versus scarring within the bilateral lung bases. No appreciable airspace consolidation or pulmonary edema. Electronically Signed   By: Kellie Simmering D.O.   On: 11/01/2021 18:56     Assessment & Plan: Ms. ARIZONA SORN is a 66 y.o.  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  Baseline  creatinine of 0.96 with GFR 62 on 10/19/2021.  Acute kidney injury likely secondary to tumor lysis syndrome with a component of dehydration due to nausea and vomiting prior to admission.  CT abdomen pelvis negative for renal obstruction.  No IV contrast exposure. Phosphorus elevated on admission 7.2.  Currently 5.5.  Rasburicase given last night.  Continue IV fluids at this time.  Offer supportive care for nausea or vomiting.  We will continue to monitor.  2.  Acute metabolic acidosis.  Bicarb on admission.  Currently improved to 19.  Patient has received IV sodium bicarb injections.  3.  Hyponatremia/hyperkalemia likely due to volume depletion from nausea and vomiting.  Admission potassium 7.3, sodium 121. Furosemide given along with hyperkalemia protocol to correct electrolytes.  He has been potassium potassium corrected to 4.7 and sodium 134.  LOS: 0 Lisandra Mathisen 5/24/202312:42 PM

## 2021-11-02 NOTE — Telephone Encounter (Signed)
Oral Oncology Patient Advocate Encounter   Received notification from [facility] that prior authorization for [name] is required.   PA submitted on CoverMyMeds Key [id number] Status is pending   Oral Oncology Clinic will continue to follow.  [place] Patient [place] Phone [phone] Fax [phone] 11/02/2021 4:09 PM

## 2021-11-02 NOTE — Assessment & Plan Note (Signed)
-   Status post albuterol 50 mg, D50, insulin 10 units aspart, 2 doses of sodium bicarb 50 mill equivalent, 2 doses of calcium gluconate 1 g IV, sodium chloride 2 L bolus, furosemide 80 mg IV one-time dose - Nephrology has been consulted by EDP, we appreciate further recommendations and assistance - Check BMP in the a.m.

## 2021-11-02 NOTE — Progress Notes (Signed)
Admission profile updated. ?

## 2021-11-02 NOTE — Telephone Encounter (Signed)
Per Dr. Janese Banks we will cancel tomorrows appointment. I will reach out to daughter and make her aware of cancellation.

## 2021-11-02 NOTE — Telephone Encounter (Signed)
Oral Oncology Patient Advocate Encounter  Prior Authorization for Diamond Hinton has been approved.    PA# [id number] Effective dates: 06/12/21 through 06/11/22  Patients co-pay is $3300.76  Oral Oncology Clinic will continue to follow.   [place] Patient [place] Phone [phone] Fax [phone] 11/02/2021 4:10 PM

## 2021-11-02 NOTE — Assessment & Plan Note (Signed)
-   Levothyroxine 88 mcg daily resumed 

## 2021-11-02 NOTE — ED Notes (Signed)
Pt sleeping when RN walked in. Introduced self to pt . Requests applesauce.

## 2021-11-02 NOTE — Assessment & Plan Note (Signed)
-   Etiology unclear however I do suspect there is a prerenal component involved - CMP in the a.m.

## 2021-11-02 NOTE — Consult Note (Signed)
Hematology/Oncology Consult note [name] [phone] [phone]  Patient Care Team: Virginia Crews, MD as PCP - General (Family Medicine) Theodore Demark, RN as Oncology Nurse Navigator   Name of the patient: Diamond Hinton  [id number]  1955/10/22    Reason for consult: History of metastatic breast cancer admitted for electrolyte abnormalities and AKI  Requesting physician: Dr. 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  She is s/p palliative gastrojejunostomy as well as external biliary drain with resolution of gastric outlet obstruction as well as obstructive jaundice.  Patient was started on Kisqali plus letrozole for her metastatic breast cancer in April 2023.  She was admitted in the hospital with AKI and possible sepsis in April 2023 which resolved with IV hydration.  She is now admitted with similar symptoms of nausea vomiting and was found to have AKI hyperkalemia, hyperphosphatemia and hyperuricemia.  Patient presently feels better after IV hydration.  She is trying to eat and nausea is better.  ECOG PS- 1  Pain scale- 0   Review of systems- Review of Systems  Constitutional:  Positive for malaise/fatigue. Negative for chills, fever and weight loss.  HENT:  Negative for congestion, ear discharge and nosebleeds.   Eyes:  Negative for blurred vision.  Respiratory:  Negative for cough, hemoptysis, sputum production, shortness of breath and wheezing.   Cardiovascular:  Negative for chest pain, palpitations, orthopnea and claudication.  Gastrointestinal:  Positive for nausea and vomiting. Negative for abdominal pain, blood in stool, constipation, diarrhea, heartburn and melena.  Genitourinary:  Negative for dysuria,  flank pain, frequency, hematuria and urgency.  Musculoskeletal:  Negative for back pain, joint pain and myalgias.  Skin:  Negative for rash.  Neurological:  Negative for dizziness, tingling, focal weakness, seizures, weakness and headaches.  Endo/Heme/Allergies:  Does not bruise/bleed easily.  Psychiatric/Behavioral:  Negative for depression and suicidal ideas. The patient does not have insomnia.    Allergies  Allergen Reactions   Peanuts [Peanut Oil] Shortness Of Breath and Itching    Patient Active Problem List   Diagnosis Date Noted   Hyperkalemia 11/01/2021   Tumor lysis syndrome 11/01/2021   Severe sepsis ([place]) 10/08/2021   Acute renal failure ([place]) 10/08/2021   Goals of care, counseling/discussion    Palliative care encounter    Hypophosphatemia 09/05/2021   Constipation 09/04/2021   Stenosis of duodenum    Obstructive jaundice 08/29/2021   Primary malignant neoplasm of breast with metastasis ([place]) 08/29/2021   History of cholecystectomy 08/29/2021   Gastric outlet obstruction 08/29/2021   Hypokalemia 08/29/2021   Elevated lipase 08/29/2021   Reflux gastritis 08/04/2021   Diabetes mellitus ([place]) 04/18/2021   Hypertension associated with diabetes ([place]) 04/18/2021   Hyperlipidemia associated with type 2 diabetes mellitus ([place]) 02/19/2015   Abnormal LFTs 02/19/2015   Adult hypothyroidism 02/19/2015     Past Medical History:  Diagnosis Date   Anemia    Anxiety    Breast cancer ([place])    Gallstones    GERD (gastroesophageal reflux disease)    Hyperlipidemia    Hypertension    Jaundice 08/29/2021   Rash 08/29/2021   Right upper quadrant abdominal pain 08/29/2021   UTI (urinary tract infection) 08/29/2021     Past Surgical History:  Procedure Laterality Date   BILATERAL SALPINGECTOMY  04/03/1997   CHOLECYSTECTOMY  N/A 07/07/2015   Procedure: LAPAROSCOPIC CHOLECYSTECTOMY ;  Surgeon: Jules Husbands, MD;  Location: [facility] ORS;  Service: General;  Laterality: N/A;    ESOPHAGOGASTRODUODENOSCOPY  08/30/2021   Procedure: ESOPHAGOGASTRODUODENOSCOPY (EGD);  Surgeon: Lucilla Lame, MD;  Location[facility] END[facility]COPY;  Service: Endoscopy;;   GALLBLADDER SURGERY  07/07/2015   [facility]   IR BILIARY DRAIN PLACEMENT WITH CHOLANGIOGRAM  08/31/2021   IR CONVERT BILIARY DRAIN TO INT EXT BILIARY DRAIN  09/02/2021   IR EXCHANGE BILIARY DRAIN  10/10/2021    Social History   Socioeconomic History   Marital status: Married    Spouse name: Not on file   Number of children: Not on file   Years of education: Not on file   Highest education level: Not on file  Occupational History   Not on file  Tobacco Use   Smoking status: Never   Smokeless tobacco: Never  Vaping Use   Vaping Use: Never used  Substance and Sexual Activity   Alcohol use: No   Drug use: No   Sexual activity: Yes  Other Topics Concern   Not on file  Social History Narrative   Not on file   Social Determinants of Health   Financial Resource Strain: Not on file  Food Insecurity: Not on file  Transportation Needs: Not on file  Physical Activity: Not on file  Stress: Not on file  Social Connections: Not on file  Intimate Partner Violence: Not on file     Family History  Problem Relation Age of Onset   Hypertension Mother    CVA Mother    Ulcers Mother    Emphysema Mother    Heart disease Father    Prostate cancer Father    Macular degeneration Father    Hypertension Sister    Diabetes Sister    Cervical polyp Sister    Hypertension Sister    Diabetes Sister    Lymphoma Sister      Current Facility-Administered Medications:    0.9 %  sodium chloride infusion, , Intravenous, Continuous, Cox, Amy N, DO, Last Rate: 125 mL/hr at 11/02/21 0759, New Bag at 11/02/21 0759   acetaminophen (TYLENOL) tablet 650 mg, 650 mg, Oral, Q6H PRN **OR** acetaminophen (TYLENOL) suppository 650 mg, 650 mg, Rectal, Q6H PRN, Cox, Amy N, DO   feeding supplement (ENSURE ENLIVE / ENSURE PLUS) liquid 237 mL, 237 mL,  Oral, TID BM, Cox, Amy N, DO   heparin injection 5,000 Units, 5,000 Units, Subcutaneous, Q8H, Cox, Amy N, DO, 5,000 Units at 11/02/21 0759   insulin aspart (novoLOG) injection 0-9 Units, 0-9 Units, Subcutaneous, TID WC, Cox, Amy N, DO, 1 Units at 11/02/21 0759   letrozole [facility]) tablet 2.5 mg, 2.5 mg, Oral, Daily, Cox, Amy N, DO, 2.5 mg at 11/02/21 1138   levothyroxine (SYNTHROID) tablet 88 mcg, 88 mcg, Oral, Q0600, Cox, Amy N, DO, 88 mcg at 11/02/21 0522   multivitamin with minerals tablet 1 tablet, 1 tablet, Oral, Daily, Cox, Amy N, DO, 1 tablet at 11/02/21 1138   ondansetron (ZOFRAN) tablet 4 mg, 4 mg, Oral, Q6H PRN **OR** ondansetron (ZOFRAN) injection 4 mg, 4 mg, Intravenous, Q6H PRN, Cox, Amy N, DO   pantoprazole (PROTONIX) EC tablet 40 mg, 40 mg, Oral, Daily, Cox, Amy N, DO, 40 mg at 11/02/21 1139   traZODone (DESYREL) tablet 50 mg, 50 mg, Oral, QHS PRN, Cox, Amy N, DO   Physical exam:  Vitals:   11/02/21 1215 11/02/21 1515 11/02/21 1530 11/02/21 1542  BP:  122/74  117/61  Pulse: 77 72 70 69  Resp: 19 19 (!) 22   Temp:  97.6 F (36.4 C)  97.6 F (36.4 C)  TempSrc:  Oral  Oral  SpO2: 100% 99% 99% 100%  Weight:      Height:       Physical Exam Constitutional:      General: She is not in acute distress. Cardiovascular:     Rate and Rhythm: Normal rate and regular rhythm.     Heart sounds: Normal heart sounds.  Pulmonary:     Effort: Pulmonary effort is normal.     Breath sounds: Normal breath sounds.  Abdominal:     General: Bowel sounds are normal.     Palpations: Abdomen is soft.     Comments: External biliary drain in place  Skin:    General: Skin is warm and dry.  Neurological:     Mental Status: She is alert and oriented to person, place, and time.          Latest Ref Rng & Units 11/02/2021    5:22 AM  CMP  Glucose 70 - 99 mg/dL 166    BUN 8 - 23 mg/dL 89    Creatinine 0.44 - 1.00 mg/dL 2.98    Sodium 135 - 145 mmol/L 134    Potassium 3.5 - 5.1 mmol/L  4.7    Chloride 98 - 111 mmol/L 102    CO2 22 - 32 mmol/L 19    Calcium 8.9 - 10.3 mg/dL 9.5    Total Protein 6.5 - 8.1 g/dL 6.8    Total Bilirubin 0.3 - 1.2 mg/dL 0.8    Alkaline Phos 38 - 126 U/L 95    AST 15 - 41 U/L 29    ALT 0 - 44 U/L 27        Latest Ref Rng & Units 11/02/2021    5:22 AM  CBC  WBC 4.0 - 10.5 K/uL 17.4    Hemoglobin 12.0 - 15.0 g/dL 13.0    Hematocrit 36.0 - 46.0 % 37.8    Platelets 150 - 400 K/uL 331      '@IMAGES'$ @  CT ABDOMEN PELVIS WO CONTRAST  Result Date: 11/01/2021 CLINICAL DATA:  Nausea vomiting EXAM: CT ABDOMEN AND PELVIS WITHOUT CONTRAST TECHNIQUE: Multidetector CT imaging of the abdomen and pelvis was performed following the standard protocol without IV contrast. RADIATION DOSE REDUCTION: This exam was performed according to the departmental dose-optimization program which includes automated exposure control, adjustment of the mA and/or kV according to patient size and/or use of iterative reconstruction technique. COMPARISON:  CT 10/07/2021, 08/29/2021 FINDINGS: Lower chest: Improved aeration at the lung bases with residual scarring. No acute consolidation or pleural effusion. Coronary vascular calcification. Mildly irregular density within the inferior left breast as before. Hepatobiliary: Internal external biliary drainage catheter with pigtail in the second portion of the duodenum. Status post cholecystectomy. No significant biliary dilatation. Pancreas: Unremarkable. No pancreatic ductal dilatation or surrounding inflammatory changes. Spleen: Normal in size without focal abnormality. Adrenals/Urinary Tract: Adrenal glands are normal. Kidneys show no hydronephrosis. The bladder is unremarkable Stomach/Bowel: Postsurgical changes of the stomach consistent with gastro jejunostomy. No obstructive features. Soft tissue thickening at the second portion of duodenum as before. Remainder of the small bowel is nondistended. Moderate to large stool burden. Negative  appendix. Diverticular disease of the left colon Vascular/Lymphatic: Advanced aortic atherosclerosis. No aneurysm. No suspicious lymph node Reproductive: Uterus and bilateral adnexa are unremarkable. Other: Negative for pelvic effusion  or free air Musculoskeletal: Degenerative changes of the spine. No acute osseous abnormality IMPRESSION: 1. No evidence for bowel obstruction on the current exam. No evidence for new bowel wall thickening. Status post gastrojejunostomy without adverse features at this time. Redemonstrated soft tissue thickening/abnormality in the region of the second portion of duodenum 2. Similar positioning of internal external biliary drainage catheter without significant biliary dilatation 3. Redemonstrated mild irregular density at the inferior left breast for which outpatient mammography and ultrasound was previously advised Electronically Signed   By: Donavan Foil M.D.   On: 11/01/2021 18:58   CT Abdomen Pelvis Wo Contrast  Result Date: 10/08/2021 CLINICAL DATA:  Acute abdominal pain, history of invasive breast carcinoma EXAM: CT ABDOMEN AND PELVIS WITHOUT CONTRAST TECHNIQUE: Multidetector CT imaging of the abdomen and pelvis was performed following the standard protocol without IV contrast. RADIATION DOSE REDUCTION: This exam was performed according to the departmental dose-optimization program which includes automated exposure control, adjustment of the mA and/or kV according to patient size and/or use of iterative reconstruction technique. COMPARISON:  08/29/2021 FINDINGS: Lower chest: Lung bases demonstrate increasing atelectasis in the bases bilaterally. No discrete pulmonary nodules are noted. No sizable effusion is seen. The previously seen inferior left breast mass is again noted but slightly less dense than that seen on the prior study. Hepatobiliary: Biliary drainage catheter is noted extending from the left lobe of the liver through the common bile duct into the duodenum. The  degree of central biliary ductal dilatation has improved on the left although some mild residual dilatation is noted on the right. The gallbladder has been surgically removed. Liver demonstrates no discrete lesions although the lack of IV contrast somewhat limits the exam. Pancreas: Unremarkable. No pancreatic ductal dilatation or surrounding inflammatory changes. Spleen: Normal in size without focal abnormality. Adrenals/Urinary Tract: Adrenal glands are within normal limits. Kidneys are well visualized bilaterally. No renal calculi or obstructive changes are seen. The bladder is decompressed. Stomach/Bowel: Appendix is well visualized with inspissated barium. No obstructive or inflammatory changes of the colon are seen. Changes of recent gastrojejunostomy are noted. Persistent fullness in the region of the second portion of the duodenum is noted consistent with the known history of duodenal obstruction. Vascular/Lymphatic: Aortic atherosclerosis. No enlarged abdominal or pelvic lymph nodes. Reproductive: Uterus and bilateral adnexa are unremarkable. Other: No abdominal wall hernia or abnormality. No abdominopelvic ascites. Musculoskeletal: No acute or significant osseous findings. IMPRESSION: Changes consistent with recent gastrojejunostomy. Changes of interval internal/external biliary drainage with significant decompression of the left biliary tree. Some mild residual right biliary dilatation is noted although incompletely evaluated on this exam. Stable fullness in the second portion of the duodenum related to the known metastatic disease. Left inferior breast mass slightly less prominent than that noted on the prior exam. Electronically Signed   By: Inez Catalina M.D.   On: 10/08/2021 00:12   DG Chest 2 View  Result Date: 10/07/2021 CLINICAL DATA:  Feeling poor, abdominal pain EXAM: CHEST - 2 VIEW COMPARISON:  None. FINDINGS: Patchy bilateral lower lobe opacities, suspicious for pneumonia, less likely  atelectasis. No pleural effusion or pneumothorax. The heart is normal in size.  Thoracic aortic atherosclerosis. Mild degenerative changes of the visualized thoracolumbar spine. IMPRESSION: Patchy bilateral lower lobe opacities, suspicious for pneumonia, less likely atelectasis. Electronically Signed   By: Julian Hy M.D.   On: 10/07/2021 23:48   US Venous Img Lower Bilateral (DVT)  Result Date: 11/02/2021 CLINICAL DATA:  Leg swelling x1 day EXAM: BILATERAL  LOWER EXTREMITY VENOUS DOPPLER ULTRASOUND TECHNIQUE: Gray-scale sonography with compression, as well as color and duplex ultrasound, were performed to evaluate the deep venous system(s) from the level of the common femoral vein through the popliteal and proximal calf veins. COMPARISON:  None Available. FINDINGS: VENOUS Normal compressibility of the common femoral, superficial femoral, and popliteal veins, as well as the visualized calf veins. Visualized portions of profunda femoral vein and great saphenous vein unremarkable. No filling defects to suggest DVT on grayscale or color Doppler imaging. Doppler waveforms show normal direction of venous flow, normal respiratory plasticity and response to augmentation. OTHER None. Limitations: none IMPRESSION: Negative. Electronically Signed   By: Julian Hy M.D.   On: 11/02/2021 00:16   DG Chest Portable 1 View  Result Date: 11/01/2021 CLINICAL DATA:  Provided history: Shortness of breath. Additional history provided: Patient reports nausea/vomiting. History of breast cancer and hypertension. EXAM: PORTABLE CHEST 1 VIEW COMPARISON:  Same day CT abdomen/pelvis 11/01/2021. Prior chest radiographs 10/07/2021 and earlier. FINDINGS: A small portion of the left lateral costophrenic angle is excluded from the field of view. Heart size within normal limits. Foci of linear atelectasis versus scarring within the bilateral lung bases. No appreciable airspace consolidation or pulmonary edema. No evidence of  pleural effusion or pneumothorax. No acute bony abnormality identified. IMPRESSION: A small portion of the left lateral costophrenic angle is excluded from the field of view. Foci of linear atelectasis versus scarring within the bilateral lung bases. No appreciable airspace consolidation or pulmonary edema. Electronically Signed   By: Kellie Simmering D.O.   On: 11/01/2021 18:56   IR EXCHANGE BILIARY DRAIN  Result Date: 10/10/2021 INDICATION: 66 year old with history of biliary obstruction returns to IR for exchange of drain given leakage of bile around skin entry site. EXAM: Fluoroscopy guided exchange of internal external biliary drain. MEDICATIONS: Cefepime 2 g IV; The antibiotic was administered within an appropriate time frame prior to the initiation of the procedure. ANESTHESIA/SEDATION: Moderate (conscious) sedation was employed during this procedure. A total of Versed 2 mg and Fentanyl 100 mcg was administered intravenously by the radiology nurse. Total intra-service moderate Sedation Time: 23 minutes. The patient's level of consciousness and vital signs were monitored continuously by radiology nursing throughout the procedure under my direct supervision. FLUOROSCOPY: Radiation Exposure Index (as provided by the fluoroscopic device): 98 mGy Kerma COMPLICATIONS: None immediate. PROCEDURE: Informed written consent was obtained from the patient after a thorough discussion of the procedural risks, benefits and alternatives. All questions were addressed. Maximal Sterile Barrier Technique was utilized including caps, mask, sterile gowns, sterile gloves, sterile drape, hand hygiene and skin antiseptic. A timeout was performed prior to the initiation of the procedure. Patient positioned supine on the procedure table. The external segment of the biliary drain and surrounding skin prepped and draped in usual fashion. Scout image demonstrated the drain in appropriate position. This was confirmed by administering  contrast under fluoroscopy. The existing drain was cut. Amplatz, J, and status glide wires continue to extend through the sideholes of the biliary drain. 4 French glide cath was advanced part way through the drain followed by placement of Amplatz guidewire. Drain was successfully removed. New 10.2 French internal external biliary drain was inserted with pigtail positioned in the duodenum. Contrast administered through the drain confirmed appropriate positioning under fluoroscopy. Drain was flushed and attached to bag. IMPRESSION: Successful exchange of internal external biliary drain with new 10.2 [place] drain in place. PLAN: Patient should be scheduled for routine exchange in 8 weeks.  Electronically Signed   By: Miachel Roux M.D.   On: 10/10/2021 14:14    Assessment and plan- Patient is a 66 y.o. female with history of metastatic ER positive breast cancer currently on Kisqali and letrozole admitted for AKI and electrolyte abnormalities  Although hyperuricemia, hyperphosphatemia and hyperkalemia are seen with tumor lysis patient does not have any evidence of tumor lysis syndrome as she does not have a significant tumor burden.  Her only site of metastatic breast cancer is the duodenal wall thickening.  It is unclear if patient is having some kind of an idiosyncratic reaction to [facility] given these electrolyte abnormalities as well as the fact that this is her second hospitalization for AKI despite dose reduction.  Hold Kisqali but okay to continue letrozole upon discharge.  I will switch her from [place] to ibrance and start off at a lower dose at 75 mg as an outpatient and see if she tolerates it.  No evidence of gastric outlet obstruction noted on CT scan and her total bilirubin is also normal.  Recommend continuing IV hydration until AKI resolves and electrolyte abnormalities normalized.  Thank you for this kind referral and the opportunity to participate in the care of this patient   Visit  Diagnosis 1. Hyperkalemia   2. Acute renal failure, unspecified acute renal failure type ([place])   3. Sepsis, due to unspecified organism, unspecified whether acute organ dysfunction present [facility])     Dr. Randa Evens, MD, MPH [facility] at [facility] [phone] 11/02/2021

## 2021-11-02 NOTE — ED Notes (Signed)
Patient catheter removed due to it leaking around the catheter. Pt placed on the purewick at this time

## 2021-11-03 ENCOUNTER — Inpatient Hospital Stay: Payer: Medicare HMO | Admitting: Medical Oncology

## 2021-11-03 ENCOUNTER — Inpatient Hospital Stay: Payer: Medicare HMO

## 2021-11-03 ENCOUNTER — Other Ambulatory Visit (HOSPITAL_COMMUNITY): Payer: Self-pay

## 2021-11-03 LAB — BASIC METABOLIC PANEL
Anion gap: 8 (ref 5–15)
BUN: 66 mg/dL — ABNORMAL HIGH (ref 8–23)
CO2: 17 mmol/L — ABNORMAL LOW (ref 22–32)
Calcium: 8.8 mg/dL — ABNORMAL LOW (ref 8.9–10.3)
Chloride: 111 mmol/L (ref 98–111)
Creatinine, Ser: 1.48 mg/dL — ABNORMAL HIGH (ref 0.44–1.00)
GFR, Estimated: 39 mL/min — ABNORMAL LOW (ref >60)
Glucose, Bld: 134 mg/dL — ABNORMAL HIGH (ref 70–99)
Potassium: 3.7 mmol/L (ref 3.5–5.1)
Sodium: 136 mmol/L (ref 135–145)

## 2021-11-03 LAB — CBC
HCT: 32.4 % — ABNORMAL LOW (ref 36.0–46.0)
Hemoglobin: 11.1 g/dL — ABNORMAL LOW (ref 12.0–15.0)
MCH: 30.6 pg (ref 26.0–34.0)
MCHC: 34.3 g/dL (ref 30.0–36.0)
MCV: 89.3 fL (ref 80.0–100.0)
Platelets: 241 10*3/uL (ref 150–400)
RBC: 3.63 MIL/uL — ABNORMAL LOW (ref 3.87–5.11)
RDW: 14.7 % (ref 11.5–15.5)
WBC: 6.5 10*3/uL (ref 4.0–10.5)
nRBC: 0 % (ref 0.0–0.2)

## 2021-11-03 LAB — GLUCOSE, CAPILLARY
Glucose-Capillary: 129 mg/dL — ABNORMAL HIGH (ref 70–99)
Glucose-Capillary: 141 mg/dL — ABNORMAL HIGH (ref 70–99)

## 2021-11-03 LAB — MRSA NEXT GEN BY PCR, NASAL: MRSA by PCR Next Gen: NOT DETECTED

## 2021-11-03 NOTE — Progress Notes (Signed)
[facility]  ROUNDING NOTE   Subjective:   Patient seen resting in bed Alert and oriented Denies further nausea and vomiting, able to eat apple sauce this morning. Awaiting breakfast Denies shortness of breath Mildly improved stamina since admission  Creatinine 1.48 (2.98)   Objective:  Vital signs in last 24 hours:  Temp:  [97.6 F (36.4 C)-98.7 F (37.1 C)] 98.1 F (36.7 C) (05/25 1136) Pulse Rate:  [69-80] 72 (05/25 1136) Resp:  [16-22] 17 (05/25 1136) BP: (112-135)/(61-75) 135/75 (05/25 1136) SpO2:  [98 %-100 %] 99 % (05/25 1136)  Weight change:  Filed Weights   11/01/21 1649  Weight: 55.3 kg    Intake/Output: I/O last 3 completed shifts: In: 3831.7 [P.O.:720; I.V.:3106.7; Other:5] Out: 416 [Drains:950]   Intake/Output this shift:  Total I/O In: 240 [P.O.:240] Out: 0   Physical Exam: General: NAD  Head: Normocephalic, atraumatic. Moist oral mucosal membranes  Eyes: Anicteric  Lungs:  Clear to auscultation, normal effort  Heart: Regular rate and rhythm  Abdomen:  Soft, nontender  Extremities:  No peripheral edema.  Neurologic: Nonfocal, moving all four extremities  Skin: No lesions  Access: None    Basic Metabolic Panel: Recent Labs  Lab 11/01/21 1653 11/01/21 1847 11/01/21 2048 11/02/21 0522 11/03/21 0659  NA 121*  --  130* 134* 136  K 7.3*  --  5.4* 4.7 3.7  CL 92*  --  98 102 111  CO2 9*  --  15* 19* 17*  GLUCOSE 330*  --  281* 166* 134*  BUN 97*  --  99* 89* 66*  CREATININE 4.11*  --  3.50* 2.98* 1.48*  CALCIUM 11.5*  --  10.0 9.5 8.8*  MG  --  2.6*  --  1.9  --   PHOS  --  7.2*  --  5.5*  --     Liver Function Tests: Recent Labs  Lab 11/01/21 1653 11/02/21 0522  AST 44* 29  ALT 40 27  ALKPHOS 159* 95  BILITOT 1.3* 0.8  PROT 10.1* 6.8  ALBUMIN 5.5* 3.8   Recent Labs  Lab 11/01/21 1653  LIPASE 271*   No results for input(s): AMMONIA in the last 168 hours.  CBC: Recent Labs  Lab 11/01/21 1653  11/02/21 0522 11/03/21 0659  WBC 20.7* 17.4* 6.5  HGB 17.6* 13.0 11.1*  HCT 52.3* 37.8 32.4*  MCV 88.0 87.5 89.3  PLT 736* 331 241    Cardiac Enzymes: Recent Labs  Lab 11/02/21 0522  CKTOTAL 17*    BNP: Invalid input(s): POCBNP  CBG: Recent Labs  Lab 11/02/21 0750 11/02/21 1149 11/02/21 1654 11/03/21 0800 11/03/21 1136  GLUCAP 143* 124* 116* 129* 141*    Microbiology: Results for orders placed or performed during the hospital encounter of 11/01/21  Blood culture (routine x 2)     Status: None (Preliminary result)   Collection Time: 11/01/21  6:27 PM   Specimen: Left Antecubital; Blood  Result Value Ref Range Status   Specimen Description LEFT ANTECUBITAL  Final   Special Requests   Final    BOTTLES DRAWN AEROBIC AND ANAEROBIC Blood Culture adequate volume   Culture   Final    NO GROWTH 2 DAYS Performed at [facility], [name] [place], [place] [address]    Report Status PENDING  Incomplete  Blood culture (routine x 2)     Status: None (Preliminary result)   Collection Time: 11/01/21  6:32 PM   Specimen: Left Antecubital; Blood  Result  Value Ref Range Status   Specimen Description LEFT ANTECUBITAL  Final   Special Requests   Final    BOTTLES DRAWN AEROBIC AND ANAEROBIC Blood Culture adequate volume   Culture   Final    NO GROWTH 2 DAYS Performed at [facility], [address], [place], [place] [address]    Report Status PENDING  Incomplete  MRSA Next Gen by PCR, Nasal     Status: None   Collection Time: 11/03/21  3:56 AM   Specimen: Nasal Mucosa; Nasal Swab  Result Value Ref Range Status   MRSA by PCR Next Gen NOT DETECTED NOT DETECTED Final    Comment: (NOTE) The GeneXpert MRSA Assay (FDA approved for NASAL specimens only), is one component of a comprehensive MRSA colonization surveillance program. It is not intended to diagnose MRSA infection nor to guide or monitor treatment for MRSA infections. Test performance is not  FDA approved in patients less than 37 years old. Performed at [facility], [address] [place], [place] [address]     Coagulation Studies: No results for input(s): LABPROT, INR in the last 72 hours.  Urinalysis: Recent Labs    11/01/21 1653  COLORURINE YELLOW*  LABSPEC 1.011  PHURINE 5.0  GLUCOSEU 150*  HGBUR NEGATIVE  BILIRUBINUR NEGATIVE  KETONESUR NEGATIVE  PROTEINUR NEGATIVE  NITRITE NEGATIVE  LEUKOCYTESUR NEGATIVE      Imaging: CT ABDOMEN PELVIS WO CONTRAST  Result Date: 11/01/2021 CLINICAL DATA:  Nausea vomiting EXAM: CT ABDOMEN AND PELVIS WITHOUT CONTRAST TECHNIQUE: Multidetector CT imaging of the abdomen and pelvis was performed following the standard protocol without IV contrast. RADIATION DOSE REDUCTION: This exam was performed according to the departmental dose-optimization program which includes automated exposure control, adjustment of the mA and/or kV according to patient size and/or use of iterative reconstruction technique. COMPARISON:  CT 10/07/2021, 08/29/2021 FINDINGS: Lower chest: Improved aeration at the lung bases with residual scarring. No acute consolidation or pleural effusion. Coronary vascular calcification. Mildly irregular density within the inferior left breast as before. Hepatobiliary: Internal external biliary drainage catheter with pigtail in the second portion of the duodenum. Status post cholecystectomy. No significant biliary dilatation. Pancreas: Unremarkable. No pancreatic ductal dilatation or surrounding inflammatory changes. Spleen: Normal in size without focal abnormality. Adrenals/Urinary Tract: Adrenal glands are normal. Kidneys show no hydronephrosis. The bladder is unremarkable Stomach/Bowel: Postsurgical changes of the stomach consistent with gastro jejunostomy. No obstructive features. Soft tissue thickening at the second portion of duodenum as before. Remainder of the small bowel is nondistended. Moderate to large stool  burden. Negative appendix. Diverticular disease of the left colon Vascular/Lymphatic: Advanced aortic atherosclerosis. No aneurysm. No suspicious lymph node Reproductive: Uterus and bilateral adnexa are unremarkable. Other: Negative for pelvic effusion or free air Musculoskeletal: Degenerative changes of the spine. No acute osseous abnormality IMPRESSION: 1. No evidence for bowel obstruction on the current exam. No evidence for new bowel wall thickening. Status post gastrojejunostomy without adverse features at this time. Redemonstrated soft tissue thickening/abnormality in the region of the second portion of duodenum 2. Similar positioning of internal external biliary drainage catheter without significant biliary dilatation 3. Redemonstrated mild irregular density at the inferior left breast for which outpatient mammography and ultrasound was previously advised Electronically Signed   By: Donavan Foil M.D.   On: 11/01/2021 18:58   US Venous Img Lower Bilateral (DVT)  Result Date: 11/02/2021 CLINICAL DATA:  Leg swelling x1 day EXAM: BILATERAL LOWER EXTREMITY VENOUS DOPPLER ULTRASOUND TECHNIQUE: Gray-scale sonography with compression, as  well as color and duplex ultrasound, were performed to evaluate the deep venous system(s) from the level of the common femoral vein through the popliteal and proximal calf veins. COMPARISON:  None Available. FINDINGS: VENOUS Normal compressibility of the common femoral, superficial femoral, and popliteal veins, as well as the visualized calf veins. Visualized portions of profunda femoral vein and great saphenous vein unremarkable. No filling defects to suggest DVT on grayscale or color Doppler imaging. Doppler waveforms show normal direction of venous flow, normal respiratory plasticity and response to augmentation. OTHER None. Limitations: none IMPRESSION: Negative. Electronically Signed   By: Julian Hy M.D.   On: 11/02/2021 00:16   DG Chest Portable 1 View  Result  Date: 11/01/2021 CLINICAL DATA:  Provided history: Shortness of breath. Additional history provided: Patient reports nausea/vomiting. History of breast cancer and hypertension. EXAM: PORTABLE CHEST 1 VIEW COMPARISON:  Same day CT abdomen/pelvis 11/01/2021. Prior chest radiographs 10/07/2021 and earlier. FINDINGS: A small portion of the left lateral costophrenic angle is excluded from the field of view. Heart size within normal limits. Foci of linear atelectasis versus scarring within the bilateral lung bases. No appreciable airspace consolidation or pulmonary edema. No evidence of pleural effusion or pneumothorax. No acute bony abnormality identified. IMPRESSION: A small portion of the left lateral costophrenic angle is excluded from the field of view. Foci of linear atelectasis versus scarring within the bilateral lung bases. No appreciable airspace consolidation or pulmonary edema. Electronically Signed   By: Kellie Simmering D.O.   On: 11/01/2021 18:56     Medications:     feeding supplement  237 mL Oral TID BM   heparin  5,000 Units Subcutaneous Q8H   insulin aspart  0-9 Units Subcutaneous TID WC   letrozole  2.5 mg Oral Daily   levothyroxine  88 mcg Oral Q0600   multivitamin with minerals  1 tablet Oral Daily   pantoprazole  40 mg Oral Daily   acetaminophen **OR** acetaminophen, ondansetron **OR** ondansetron (ZOFRAN) IV, traZODone  Assessment/ Plan:  Ms. Diamond Hinton is a 66 y.o.  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  Baseline  creatinine of 0.96 with GFR 62 on 10/19/2021.  Acute kidney injury likely secondary to tumor lysis syndrome with a component of dehydration due to nausea and vomiting prior to admission.  CT abdomen pelvis negative for renal obstruction.  No IV contrast exposure.   Renal function has improved with IVF. Appetite is improving. We comfortable with discharge and continued follow up with cancer center to monitor labs.    Lab Results  Component Value Date   CREATININE 1.48 (H) 11/03/2021   CREATININE 2.98 (H) 11/02/2021   CREATININE 3.50 (H) 11/01/2021    Intake/Output Summary (Last 24 hours) at 11/03/2021 1349 Last data filed at 11/03/2021 1136 Gross per 24 hour  Intake 4071.68 ml  Output 650 ml  Net 3421.68 ml   2.   Acute metabolic acidosis.  Bicarb 17.    3.  Hyponatremia/hyperkalemia likely due to volume depletion from nausea and vomiting.  Admission potassium 7.3, sodium 121.  Electrolytes improved with IVF and oral  nutrition recovering. Continue supportive care.       LOS: 1 Zailen Albarran 5/25/20231:49 PM

## 2021-11-03 NOTE — Telephone Encounter (Signed)
Daughter called and asked if we should cancel or r/s appointment since pt is currently in hospital. Per secure chat; Dr. Janese Banks has agreed to cancel appt. Daughter is aware and will contact us when pt is discharged for future appts.

## 2021-11-03 NOTE — Discharge Instructions (Signed)
Please follow up with your provider (either primary or oncology) within the next week to recheck your electrolytes.  I recommend holding your potassium pills until follow up. Today your electrolytes are stable in normal range.  Return to ED if experiencing return of your symptoms.  Good luck in your recovery.

## 2021-11-03 NOTE — Consult Note (Signed)
[facility] Inpatient Consult   11/03/2021  Diamond Hinton 09-13-1955 [id number]  [facility] CM)   Patient was reviewed for less than 30 days unplanned readmission and high risk score for unplanned readmission. Assessed for post hospital chronic care coordination needs with [facility] CM.  Per review, patient's primary provider office offers an embedded chronic care management team and is listed for the transition of care follow up and appointments.   Of note, [facility] Care Management services does not replace or interfere with any services that are arranged by inpatient case management or social work.   Netta Cedars, MSN, RN [facility] Liaison Toll free office [phone]

## 2021-11-03 NOTE — TOC Transition Note (Signed)
Transition of Care [facility]) - CM/SW Discharge Note   Patient Details  Name: Diamond Hinton MRN: [id number] Date of Birth: 16-Mar-1956  Transition of Care [facility]) CM/SW Contact:  Candie Chroman, LCSW Phone Number: 11/03/2021, 11:24 AM   Clinical Narrative:  Patient has orders to discharge home today. TOC completed readmission prevention screen on 4/29:   "Completed high readmission risk assessment.  Patient lives with her husband. Patient drives herself to appointments, or daughter takes her when needed.  PCP is Dr. Jacinto Reap. Pharmacy is [facility]. No DME, HH, or SNF history.  Patient is listed as Medicaid Potential, however confirmed with patient and daughter that patient has [employer]. This is listed on Facesheet as 2nd insurance.  No TOC needs at this time."  No TOC needs identified this admission. CSW signing off.   Final next level of care: Home/Self Care Barriers to Discharge: No Barriers Identified   Patient Goals and CMS Choice        Discharge Placement                    Patient and family notified of of transfer: 11/03/21  Discharge Plan and Services                                     Social Determinants of Health (SDOH) Interventions     Readmission Risk Interventions    10/08/2021    2:08 PM  Readmission Risk Prevention Plan  Transportation Screening Complete  PCP or Specialist Appt within 3-5 Days Complete  HRI or [place] Complete  Social Work Consult for [place] Planning/Counseling Complete  Palliative Care Screening Complete  Medication Review [employer]) Complete

## 2021-11-05 NOTE — Discharge Summary (Signed)
Physician Discharge Summary  Patient: Diamond Hinton QTM:[id number] DOB: [birth year]   Code Status: Prior Admit date: 11/01/2021 Discharge date: 11/03/2021 Disposition: Home, No home health services recommended PCP: Virginia Crews, MD  Recommendations for Outpatient Follow-up:  Follow up with PCP within 1-2 weeks Patient will need electrolytes rechecked at follow up appointment with any of her providers next Follow up with heme/onc  Discharge Diagnoses:  Principal Problem:   Tumor lysis syndrome Active Problems:   Severe sepsis ([place])   Acute renal failure ([place])   Adult hypothyroidism   Hypertension associated with diabetes ([place])   Gastric outlet obstruction   Primary malignant neoplasm of breast with metastasis ([place])   Hyperlipidemia associated with type 2 diabetes mellitus ([place])   Hyperkalemia  Brief Hospital Course Summary: Diamond Hinton is a 66 y.o. 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   They presented from home to the ED on 11/01/2021 with nausea and vomiting x 1 days. Of note, patient was taking potassium supplementation at home. She is currently undergoing chemotherapy for her breast cancer.   In the ED, it was found that they had stable vital signs with exception of mild tachycardia of 106.  Significant findings included sodium 121, potassium 7.3, chloride 92, bicarb of 9, BUN of 97, serum creatinine of 4.11, nonfasting blood glucose 330, repeat BMP showed serum sodium 130, potassium 5.4, chloride 98, bicarb 15, BUN 99, serum creatinine of 3.50, GFR of 14.  WBC 20.7, hemoglobin 17.6, platelets 736.   They were initially treated with Albuterol nebulizer with 15 mg, D50 IV, furosemide 80 mg IV, insulin aspart 10 units, ondansetron 4 mg IV, 2 doses of sodium bicarb 50 mill equivalent, 2 doses calcium gluconate 1 g IV, sodium chloride 2 L bolus, additional  sodium bicarb 150 mill equivalent in water infusion, vancomycin 1 g IV, cefepime, metronidazole.  Consult to hospitalist was called for admission for treatment of tumor lysis syndrome.   Oncology was consulted and evaluated. They did not believe that she was experiencing tumor lysis syndrome due to not having a large tumor burden. Thought to be a reaction to her chemotherapy agent so that was discontinued. Further treatment decisions to be discussed at follow up.  Due to no source of infection, all antibiotics given on admission were not continued. Patient remained asymptomatic.   Patient's K+ improved from 7.3>4.7> 3.7 on day of discharge. Nephrology was consulted but she did not require urgent dialysis as she responded well to medical therapy. Her home potassium supplementation was held at discharge.  Discharge Condition: Good, improved Recommended discharge diet: Regular healthy diet  Consultations: Oncology  Nephrology   Procedures/Studies: None   Allergies as of 11/03/2021       Reactions   Peanuts [peanut Oil] Shortness Of Breath, Itching        Medication List     STOP taking these medications    pantoprazole 40 MG tablet Commonly known as: Protonix   potassium chloride 10 MEQ tablet Commonly known as: KLOR-CON   ribociclib succ 200 MG Therapy Pack Commonly known as: KISQALI '400mg'$  Daily Dose       TAKE these medications    acetaminophen 325 MG tablet Commonly known as: TYLENOL Take 325 mg by mouth every 6 (six) hours as needed (for pain and sometimes takes 2 if pain is worse).   CENTRUM VITAMINTS PO Take 1 tablet by mouth  daily.   feeding supplement Liqd Take 237 mLs by mouth 3 (three) times daily between meals.   GAS-X PO Take by mouth. PRN   letrozole 2.5 MG tablet Commonly known as: FEMARA Take 1 tablet (2.5 mg total) by mouth daily.   levothyroxine 88 MCG tablet Commonly known as: SYNTHROID Take 1 tablet (88 mcg total) by mouth daily.    ondansetron 4 MG tablet Commonly known as: Zofran Take 1 tablet (4 mg total) by mouth daily as needed for nausea or vomiting.   prochlorperazine 5 MG tablet Commonly known as: COMPAZINE Take 1 tablet (5 mg total) by mouth every 6 (six) hours as needed for refractory nausea / vomiting.   traZODone 50 MG tablet Commonly known as: DESYREL Take 1 tablet (50 mg total) by mouth at bedtime as needed for sleep.        Follow-up Information     Virginia Crews, MD Follow up on 11/10/2021.   Specialty: Family Medicine Why: at 08:40 am Contact information: [address] [place] [address] [phone]                 Subjective   Pt reports feeling well. She has no complaints today. She feels ready to go home.   Objective  Blood pressure 135/75, pulse 72, temperature 98.1 F (36.7 C), temperature source Oral, resp. rate 17, height '5\' 3"'$  (1.6 m), weight 55.3 kg, SpO2 99 %.   General: Pt is alert, awake, not in acute distress Cardiovascular: RRR, S1/S2 +, no rubs, no gallops Respiratory: CTA bilaterally, no wheezing, no rhonchi Abdominal: Soft, NT, ND, bowel sounds + Extremities: no edema, no cyanosis   The results of significant diagnostics from this hospitalization (including imaging, microbiology, ancillary and laboratory) are listed below for reference.   Imaging studies: CT ABDOMEN PELVIS WO CONTRAST  Result Date: 11/01/2021 CLINICAL DATA:  Nausea vomiting EXAM: CT ABDOMEN AND PELVIS WITHOUT CONTRAST TECHNIQUE: Multidetector CT imaging of the abdomen and pelvis was performed following the standard protocol without IV contrast. RADIATION DOSE REDUCTION: This exam was performed according to the departmental dose-optimization program which includes automated exposure control, adjustment of the mA and/or kV according to patient size and/or use of iterative reconstruction technique. COMPARISON:  CT 10/07/2021, 08/29/2021 FINDINGS: Lower chest: Improved  aeration at the lung bases with residual scarring. No acute consolidation or pleural effusion. Coronary vascular calcification. Mildly irregular density within the inferior left breast as before. Hepatobiliary: Internal external biliary drainage catheter with pigtail in the second portion of the duodenum. Status post cholecystectomy. No significant biliary dilatation. Pancreas: Unremarkable. No pancreatic ductal dilatation or surrounding inflammatory changes. Spleen: Normal in size without focal abnormality. Adrenals/Urinary Tract: Adrenal glands are normal. Kidneys show no hydronephrosis. The bladder is unremarkable Stomach/Bowel: Postsurgical changes of the stomach consistent with gastro jejunostomy. No obstructive features. Soft tissue thickening at the second portion of duodenum as before. Remainder of the small bowel is nondistended. Moderate to large stool burden. Negative appendix. Diverticular disease of the left colon Vascular/Lymphatic: Advanced aortic atherosclerosis. No aneurysm. No suspicious lymph node Reproductive: Uterus and bilateral adnexa are unremarkable. Other: Negative for pelvic effusion or free air Musculoskeletal: Degenerative changes of the spine. No acute osseous abnormality IMPRESSION: 1. No evidence for bowel obstruction on the current exam. No evidence for new bowel wall thickening. Status post gastrojejunostomy without adverse features at this time. Redemonstrated soft tissue thickening/abnormality in the region of the second portion of duodenum 2. Similar positioning of internal external biliary  drainage catheter without significant biliary dilatation 3. Redemonstrated mild irregular density at the inferior left breast for which outpatient mammography and ultrasound was previously advised Electronically Signed   By: Donavan Foil M.D.   On: 11/01/2021 18:58   CT Abdomen Pelvis Wo Contrast  Result Date: 10/08/2021 CLINICAL DATA:  Acute abdominal pain, history of invasive breast  carcinoma EXAM: CT ABDOMEN AND PELVIS WITHOUT CONTRAST TECHNIQUE: Multidetector CT imaging of the abdomen and pelvis was performed following the standard protocol without IV contrast. RADIATION DOSE REDUCTION: This exam was performed according to the departmental dose-optimization program which includes automated exposure control, adjustment of the mA and/or kV according to patient size and/or use of iterative reconstruction technique. COMPARISON:  08/29/2021 FINDINGS: Lower chest: Lung bases demonstrate increasing atelectasis in the bases bilaterally. No discrete pulmonary nodules are noted. No sizable effusion is seen. The previously seen inferior left breast mass is again noted but slightly less dense than that seen on the prior study. Hepatobiliary: Biliary drainage catheter is noted extending from the left lobe of the liver through the common bile duct into the duodenum. The degree of central biliary ductal dilatation has improved on the left although some mild residual dilatation is noted on the right. The gallbladder has been surgically removed. Liver demonstrates no discrete lesions although the lack of IV contrast somewhat limits the exam. Pancreas: Unremarkable. No pancreatic ductal dilatation or surrounding inflammatory changes. Spleen: Normal in size without focal abnormality. Adrenals/Urinary Tract: Adrenal glands are within normal limits. Kidneys are well visualized bilaterally. No renal calculi or obstructive changes are seen. The bladder is decompressed. Stomach/Bowel: Appendix is well visualized with inspissated barium. No obstructive or inflammatory changes of the colon are seen. Changes of recent gastrojejunostomy are noted. Persistent fullness in the region of the second portion of the duodenum is noted consistent with the known history of duodenal obstruction. Vascular/Lymphatic: Aortic atherosclerosis. No enlarged abdominal or pelvic lymph nodes. Reproductive: Uterus and bilateral adnexa are  unremarkable. Other: No abdominal wall hernia or abnormality. No abdominopelvic ascites. Musculoskeletal: No acute or significant osseous findings. IMPRESSION: Changes consistent with recent gastrojejunostomy. Changes of interval internal/external biliary drainage with significant decompression of the left biliary tree. Some mild residual right biliary dilatation is noted although incompletely evaluated on this exam. Stable fullness in the second portion of the duodenum related to the known metastatic disease. Left inferior breast mass slightly less prominent than that noted on the prior exam. Electronically Signed   By: Inez Catalina M.D.   On: 10/08/2021 00:12   DG Chest 2 View  Result Date: 10/07/2021 CLINICAL DATA:  Feeling poor, abdominal pain EXAM: CHEST - 2 VIEW COMPARISON:  None. FINDINGS: Patchy bilateral lower lobe opacities, suspicious for pneumonia, less likely atelectasis. No pleural effusion or pneumothorax. The heart is normal in size.  Thoracic aortic atherosclerosis. Mild degenerative changes of the visualized thoracolumbar spine. IMPRESSION: Patchy bilateral lower lobe opacities, suspicious for pneumonia, less likely atelectasis. Electronically Signed   By: Julian Hy M.D.   On: 10/07/2021 23:48   US Venous Img Lower Bilateral (DVT)  Result Date: 11/02/2021 CLINICAL DATA:  Leg swelling x1 day EXAM: BILATERAL LOWER EXTREMITY VENOUS DOPPLER ULTRASOUND TECHNIQUE: Gray-scale sonography with compression, as well as color and duplex ultrasound, were performed to evaluate the deep venous system(s) from the level of the common femoral vein through the popliteal and proximal calf veins. COMPARISON:  None Available. FINDINGS: VENOUS Normal compressibility of the common femoral, superficial femoral, and popliteal veins, as well as  the visualized calf veins. Visualized portions of profunda femoral vein and great saphenous vein unremarkable. No filling defects to suggest DVT on grayscale or color  Doppler imaging. Doppler waveforms show normal direction of venous flow, normal respiratory plasticity and response to augmentation. OTHER None. Limitations: none IMPRESSION: Negative. Electronically Signed   By: Julian Hy M.D.   On: 11/02/2021 00:16   DG Chest Portable 1 View  Result Date: 11/01/2021 CLINICAL DATA:  Provided history: Shortness of breath. Additional history provided: Patient reports nausea/vomiting. History of breast cancer and hypertension. EXAM: PORTABLE CHEST 1 VIEW COMPARISON:  Same day CT abdomen/pelvis 11/01/2021. Prior chest radiographs 10/07/2021 and earlier. FINDINGS: A small portion of the left lateral costophrenic angle is excluded from the field of view. Heart size within normal limits. Foci of linear atelectasis versus scarring within the bilateral lung bases. No appreciable airspace consolidation or pulmonary edema. No evidence of pleural effusion or pneumothorax. No acute bony abnormality identified. IMPRESSION: A small portion of the left lateral costophrenic angle is excluded from the field of view. Foci of linear atelectasis versus scarring within the bilateral lung bases. No appreciable airspace consolidation or pulmonary edema. Electronically Signed   By: Kellie Simmering D.O.   On: 11/01/2021 18:56   IR EXCHANGE BILIARY DRAIN  Result Date: 10/10/2021 INDICATION: 66 year old with history of biliary obstruction returns to IR for exchange of drain given leakage of bile around skin entry site. EXAM: Fluoroscopy guided exchange of internal external biliary drain. MEDICATIONS: Cefepime 2 g IV; The antibiotic was administered within an appropriate time frame prior to the initiation of the procedure. ANESTHESIA/SEDATION: Moderate (conscious) sedation was employed during this procedure. A total of Versed 2 mg and Fentanyl 100 mcg was administered intravenously by the radiology nurse. Total intra-service moderate Sedation Time: 23 minutes. The patient's level of consciousness  and vital signs were monitored continuously by radiology nursing throughout the procedure under my direct supervision. FLUOROSCOPY: Radiation Exposure Index (as provided by the fluoroscopic device): 98 mGy Kerma COMPLICATIONS: None immediate. PROCEDURE: Informed written consent was obtained from the patient after a thorough discussion of the procedural risks, benefits and alternatives. All questions were addressed. Maximal Sterile Barrier Technique was utilized including caps, mask, sterile gowns, sterile gloves, sterile drape, hand hygiene and skin antiseptic. A timeout was performed prior to the initiation of the procedure. Patient positioned supine on the procedure table. The external segment of the biliary drain and surrounding skin prepped and draped in usual fashion. Scout image demonstrated the drain in appropriate position. This was confirmed by administering contrast under fluoroscopy. The existing drain was cut. Amplatz, J, and status glide wires continue to extend through the sideholes of the biliary drain. 4 French glide cath was advanced part way through the drain followed by placement of Amplatz guidewire. Drain was successfully removed. New 10.2 French internal external biliary drain was inserted with pigtail positioned in the duodenum. Contrast administered through the drain confirmed appropriate positioning under fluoroscopy. Drain was flushed and attached to bag. IMPRESSION: Successful exchange of internal external biliary drain with new 10.2 [place] drain in place. PLAN: Patient should be scheduled for routine exchange in 8 weeks. Electronically Signed   By: Miachel Roux M.D.   On: 10/10/2021 14:14    Labs: Basic Metabolic Panel: Recent Labs  Lab 11/01/21 1653 11/01/21 1847 11/01/21 2048 11/02/21 0522 11/03/21 0659  NA 121*  --  130* 134* 136  K 7.3*  --  5.4* 4.7 3.7  CL 92*  --  98  102 111  CO2 9*  --  15* 19* 17*  GLUCOSE 330*  --  281* 166* 134*  BUN 97*  --  99* 89* 66*   CREATININE 4.11*  --  3.50* 2.98* 1.48*  CALCIUM 11.5*  --  10.0 9.5 8.8*  MG  --  2.6*  --  1.9  --   PHOS  --  7.2*  --  5.5*  --    CBC: Recent Labs  Lab 11/01/21 1653 11/02/21 0522 11/03/21 0659  WBC 20.7* 17.4* 6.5  HGB 17.6* 13.0 11.1*  HCT 52.3* 37.8 32.4*  MCV 88.0 87.5 89.3  PLT 736* 331 241   Microbiology: Results for orders placed or performed during the hospital encounter of 11/01/21  Blood culture (routine x 2)     Status: None (Preliminary result)   Collection Time: 11/01/21  6:27 PM   Specimen: Left Antecubital; Blood  Result Value Ref Range Status   Specimen Description LEFT ANTECUBITAL  Final   Special Requests   Final    BOTTLES DRAWN AEROBIC AND ANAEROBIC Blood Culture adequate volume   Culture   Final    NO GROWTH 4 DAYS Performed at [facility], [address] [place], [place] [address]    Report Status PENDING  Incomplete  Blood culture (routine x 2)     Status: None (Preliminary result)   Collection Time: 11/01/21  6:32 PM   Specimen: Left Antecubital; Blood  Result Value Ref Range Status   Specimen Description LEFT ANTECUBITAL  Final   Special Requests   Final    BOTTLES DRAWN AEROBIC AND ANAEROBIC Blood Culture adequate volume   Culture   Final    NO GROWTH 4 DAYS Performed at [facility], [address] [place], [place] [address]    Report Status PENDING  Incomplete  MRSA Next Gen by PCR, Nasal     Status: None   Collection Time: 11/03/21  3:56 AM   Specimen: Nasal Mucosa; Nasal Swab  Result Value Ref Range Status   MRSA by PCR Next Gen NOT DETECTED NOT DETECTED Final    Comment: (NOTE) The GeneXpert MRSA Assay (FDA approved for NASAL specimens only), is one component of a comprehensive MRSA colonization surveillance program. It is not intended to diagnose MRSA infection nor to guide or monitor treatment for MRSA infections. Test performance is not FDA approved in patients less than 46 years old. Performed at  [facility], [address] [place], [place] [address]      Time coordinating discharge: Over 30 minutes  Richarda Osmond, MD  [facility] 11/05/2021, 1:26 PM

## 2021-11-06 LAB — CULTURE, BLOOD (ROUTINE X 2)
Culture: NO GROWTH
Culture: NO GROWTH
Special Requests: ADEQUATE
Special Requests: ADEQUATE

## 2021-11-10 ENCOUNTER — Inpatient Hospital Stay (HOSPITAL_BASED_OUTPATIENT_CLINIC_OR_DEPARTMENT_OTHER): Payer: Medicare HMO | Admitting: Medical Oncology

## 2021-11-10 ENCOUNTER — Encounter: Payer: Self-pay | Admitting: Medical Oncology

## 2021-11-10 ENCOUNTER — Inpatient Hospital Stay: Payer: Medicare HMO | Attending: Oncology

## 2021-11-10 ENCOUNTER — Ambulatory Visit: Payer: Medicare HMO | Admitting: Family Medicine

## 2021-11-10 VITALS — BP 116/88 | HR 83 | Temp 98.2°F | Resp 16 | Wt 121.0 lb

## 2021-11-10 DIAGNOSIS — A419 Sepsis, unspecified organism: Secondary | ICD-10-CM

## 2021-11-10 DIAGNOSIS — K311 Adult hypertrophic pyloric stenosis: Secondary | ICD-10-CM

## 2021-11-10 DIAGNOSIS — C784 Secondary malignant neoplasm of small intestine: Secondary | ICD-10-CM | POA: Diagnosis not present

## 2021-11-10 DIAGNOSIS — E46 Unspecified protein-calorie malnutrition: Secondary | ICD-10-CM | POA: Diagnosis not present

## 2021-11-10 DIAGNOSIS — E785 Hyperlipidemia, unspecified: Secondary | ICD-10-CM | POA: Diagnosis not present

## 2021-11-10 DIAGNOSIS — C50912 Malignant neoplasm of unspecified site of left female breast: Secondary | ICD-10-CM | POA: Insufficient documentation

## 2021-11-10 DIAGNOSIS — Z170 Estrogen receptor positive status [ER+]: Secondary | ICD-10-CM | POA: Diagnosis not present

## 2021-11-10 DIAGNOSIS — R6520 Severe sepsis without septic shock: Secondary | ICD-10-CM | POA: Diagnosis not present

## 2021-11-10 DIAGNOSIS — Z9289 Personal history of other medical treatment: Secondary | ICD-10-CM

## 2021-11-10 DIAGNOSIS — E875 Hyperkalemia: Secondary | ICD-10-CM | POA: Diagnosis not present

## 2021-11-10 LAB — COMPREHENSIVE METABOLIC PANEL
ALT: 150 U/L — ABNORMAL HIGH (ref 0–44)
AST: 132 U/L — ABNORMAL HIGH (ref 15–41)
Albumin: 4.5 g/dL (ref 3.5–5.0)
Alkaline Phosphatase: 117 U/L (ref 38–126)
Anion gap: 11 (ref 5–15)
BUN: 34 mg/dL — ABNORMAL HIGH (ref 8–23)
CO2: 17 mmol/L — ABNORMAL LOW (ref 22–32)
Calcium: 9.8 mg/dL (ref 8.9–10.3)
Chloride: 103 mmol/L (ref 98–111)
Creatinine, Ser: 0.98 mg/dL (ref 0.44–1.00)
GFR, Estimated: >60 mL/min (ref >60)
Glucose, Bld: 143 mg/dL — ABNORMAL HIGH (ref 70–99)
Potassium: 3.5 mmol/L (ref 3.5–5.1)
Sodium: 131 mmol/L — ABNORMAL LOW (ref 135–145)
Total Bilirubin: 0.6 mg/dL (ref 0.3–1.2)
Total Protein: 8.6 g/dL — ABNORMAL HIGH (ref 6.5–8.1)

## 2021-11-10 LAB — CBC WITH DIFFERENTIAL/PLATELET
Abs Immature Granulocytes: 0.04 10*3/uL (ref 0.00–0.07)
Basophils Absolute: 0.1 10*3/uL (ref 0.0–0.1)
Basophils Relative: 1 %
Eosinophils Absolute: 0.1 10*3/uL (ref 0.0–0.5)
Eosinophils Relative: 1 %
HCT: 40.3 % (ref 36.0–46.0)
Hemoglobin: 13.8 g/dL (ref 12.0–15.0)
Immature Granulocytes: 0 %
Lymphocytes Relative: 28 %
Lymphs Abs: 2.6 10*3/uL (ref 0.7–4.0)
MCH: 31.1 pg (ref 26.0–34.0)
MCHC: 34.2 g/dL (ref 30.0–36.0)
MCV: 90.8 fL (ref 80.0–100.0)
Monocytes Absolute: 0.7 10*3/uL (ref 0.1–1.0)
Monocytes Relative: 7 %
Neutro Abs: 5.6 10*3/uL (ref 1.7–7.7)
Neutrophils Relative %: 63 %
Platelets: 334 10*3/uL (ref 150–400)
RBC: 4.44 MIL/uL (ref 3.87–5.11)
RDW: 14.5 % (ref 11.5–15.5)
WBC: 9.1 10*3/uL (ref 4.0–10.5)
nRBC: 0 % (ref 0.0–0.2)

## 2021-11-10 NOTE — Progress Notes (Signed)
Pt discharged from hospital last Thursday after a 2 week stay.  Pt reports " I feel the best I have in a long time".

## 2021-11-10 NOTE — Progress Notes (Signed)
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  She was found to have diffuse duodenal wall thickening as well as a palpable mass in the left breast.  Duodenal biopsy as well as left breast biopsy was consistent withER positive greater than 90%, PR weakly +1 to 10% HER2 negative lobular breast cancer.  Patient underwent external biliary drain for the obstructive jaundice and palliative gastrojejunostomy surgery to relieve her gastric outlet obstruction.CT scan otherwise did not show any evidence of distant metastatic disease.  Interval history- Today she presents to our office as a follow up after her recent hospital stay. She was admitted from 11/01/2021-11/03/2021. She was hospitalized for hyperkalemia, questionable tumor lysis syndrome, possible sepsis. In the hospital her potassium was corrected and her chemotherapy was held as Dr. Janese Banks suspected this to be the cause for her symptoms. Since stopping her chemotherapy patient reports that she "feels the best [she has] felt in a very long time". She is eating and drinking well. She is not having any pain. She is having regular excrements.    ECOG PS- 1 Pain scale- 0   Review of systems- Review of Systems  Constitutional:  Negative for chills, fever, malaise/fatigue and weight loss.  HENT:  Negative for  congestion, ear discharge and nosebleeds.   Eyes:  Negative for blurred vision.  Respiratory:  Negative for cough, hemoptysis, sputum production, shortness of breath and wheezing.   Cardiovascular:  Negative for chest pain, palpitations, orthopnea and claudication.  Gastrointestinal:  Negative for abdominal pain, blood in stool, constipation, diarrhea, heartburn, melena, nausea and vomiting.  Genitourinary:  Negative for dysuria, flank pain, frequency, hematuria and urgency.  Musculoskeletal:  Negative for back pain, joint pain and myalgias.  Skin:  Negative for rash.  Neurological:  Negative for dizziness, tingling, focal weakness, seizures, weakness and headaches.  Endo/Heme/Allergies:  Does not bruise/bleed easily.  Psychiatric/Behavioral:  Negative for depression and suicidal ideas. The patient does not have insomnia.       Allergies  Allergen Reactions   Peanuts [Peanut Oil] Shortness Of Breath and Itching     Past Medical History:  Diagnosis Date   Anemia    Anxiety    Breast cancer ([place])    Gallstones    GERD (gastroesophageal reflux disease)    Hyperlipidemia    Hypertension    Jaundice 08/29/2021   Rash 08/29/2021   Right upper quadrant abdominal pain 08/29/2021   UTI (urinary tract infection) 08/29/2021     Past Surgical History:  Procedure Laterality Date   BILATERAL SALPINGECTOMY  04/03/1997   CHOLECYSTECTOMY N/A 07/07/2015   Procedure: LAPAROSCOPIC CHOLECYSTECTOMY ;  Surgeon: Jules Husbands, MD;  Location: [facility] ORS;  Service: General;  Laterality: N/A;   ESOPHAGOGASTRODUODENOSCOPY  08/30/2021   Procedure: ESOPHAGOGASTRODUODENOSCOPY (EGD);  Surgeon: Lucilla Lame, MD;  Location: [facility] ENDOSCOPY;  Service: Endoscopy;;   GALLBLADDER SURGERY  07/07/2015   [facility]   IR BILIARY DRAIN PLACEMENT WITH  CHOLANGIOGRAM  08/31/2021   IR CONVERT BILIARY DRAIN TO INT EXT BILIARY DRAIN  09/02/2021   IR EXCHANGE BILIARY DRAIN  10/10/2021    Social History   Socioeconomic History    Marital status: Married    Spouse name: Not on file   Number of children: Not on file   Years of education: Not on file   Highest education level: Not on file  Occupational History   Not on file  Tobacco Use   Smoking status: Never   Smokeless tobacco: Never  Vaping Use   Vaping Use: Never used  Substance and Sexual Activity   Alcohol use: No   Drug use: No   Sexual activity: Yes  Other Topics Concern   Not on file  Social History Narrative   Not on file   Social Determinants of Health   Financial Resource Strain: Not on file  Food Insecurity: Not on file  Transportation Needs: Not on file  Physical Activity: Not on file  Stress: Not on file  Social Connections: Not on file  Intimate Partner Violence: Not on file    Family History  Problem Relation Age of Onset   Hypertension Mother    CVA Mother    Ulcers Mother    Emphysema Mother    Heart disease Father    Prostate cancer Father    Macular degeneration Father    Hypertension Sister    Diabetes Sister    Cervical polyp Sister    Hypertension Sister    Diabetes Sister    Lymphoma Sister      Current Outpatient Medications:    acetaminophen (TYLENOL) 325 MG tablet, Take 325 mg by mouth every 6 (six) hours as needed (for pain and sometimes takes 2 if pain is worse)., Disp: , Rfl:    feeding supplement (ENSURE ENLIVE / ENSURE PLUS) LIQD, Take 237 mLs by mouth 3 (three) times daily between meals., Disp: 237 mL, Rfl: 12   letrozole (FEMARA) 2.5 MG tablet, Take 1 tablet (2.5 mg total) by mouth daily., Disp: 30 tablet, Rfl: 3   levothyroxine (SYNTHROID) 88 MCG tablet, Take 1 tablet (88 mcg total) by mouth daily., Disp: 90 tablet, Rfl: 1   Multiple Vitamins-Minerals (CENTRUM VITAMINTS PO), Take 1 tablet by mouth daily., Disp: , Rfl:    ondansetron (ZOFRAN) 4 MG tablet, Take 1 tablet (4 mg total) by mouth daily as needed for nausea or vomiting., Disp: 30 tablet, Rfl: 1   prochlorperazine (COMPAZINE) 5 MG tablet, Take  1 tablet (5 mg total) by mouth every 6 (six) hours as needed for refractory nausea / vomiting., Disp: 30 tablet, Rfl: 1   Simethicone (GAS-X PO), Take by mouth. PRN, Disp: , Rfl:    traZODone (DESYREL) 50 MG tablet, Take 1 tablet (50 mg total) by mouth at bedtime as needed for sleep. (Patient not taking: Reported on 11/10/2021), Disp: 30 tablet, Rfl: 0  Physical exam:  Vitals:   11/10/21 1009  BP: 116/88  Pulse: 83  Resp: 16  Temp: 98.2 F (36.8 C)  TempSrc: Tympanic  SpO2: 99%  Weight: 121 lb (54.9 kg)   Physical Exam Constitutional:      General: She is not in acute distress. Cardiovascular:     Rate and Rhythm: Normal rate and regular rhythm.     Heart sounds: Normal heart sounds.  Pulmonary:     Effort: Pulmonary effort is normal.     Breath sounds: Normal breath sounds.  Abdominal:     General:  Bowel sounds are normal.     Palpations: Abdomen is soft.  Skin:    General: Skin is warm and dry.  Neurological:     Mental Status: She is alert and oriented to person, place, and time.        Latest Ref Rng & Units 11/10/2021    9:46 AM  CMP  Glucose 70 - 99 mg/dL 143    BUN 8 - 23 mg/dL 34    Creatinine 0.44 - 1.00 mg/dL 0.98    Sodium 135 - 145 mmol/L 131    Potassium 3.5 - 5.1 mmol/L 3.5    Chloride 98 - 111 mmol/L 103    CO2 22 - 32 mmol/L 17    Calcium 8.9 - 10.3 mg/dL 9.8    Total Protein 6.5 - 8.1 g/dL 8.6    Total Bilirubin 0.3 - 1.2 mg/dL 0.6    Alkaline Phos 38 - 126 U/L 117    AST 15 - 41 U/L 132    ALT 0 - 44 U/L 150        Latest Ref Rng & Units 11/10/2021    9:46 AM  CBC  WBC 4.0 - 10.5 K/uL 9.1    Hemoglobin 12.0 - 15.0 g/dL 13.8    Hematocrit 36.0 - 46.0 % 40.3    Platelets 150 - 400 K/uL 334      No images are attached to the encounter.  CT ABDOMEN PELVIS WO CONTRAST  Result Date: 11/01/2021 CLINICAL DATA:  Nausea vomiting EXAM: CT ABDOMEN AND PELVIS WITHOUT CONTRAST TECHNIQUE: Multidetector CT imaging of the abdomen and pelvis was performed  following the standard protocol without IV contrast. RADIATION DOSE REDUCTION: This exam was performed according to the departmental dose-optimization program which includes automated exposure control, adjustment of the mA and/or kV according to patient size and/or use of iterative reconstruction technique. COMPARISON:  CT 10/07/2021, 08/29/2021 FINDINGS: Lower chest: Improved aeration at the lung bases with residual scarring. No acute consolidation or pleural effusion. Coronary vascular calcification. Mildly irregular density within the inferior left breast as before. Hepatobiliary: Internal external biliary drainage catheter with pigtail in the second portion of the duodenum. Status post cholecystectomy. No significant biliary dilatation. Pancreas: Unremarkable. No pancreatic ductal dilatation or surrounding inflammatory changes. Spleen: Normal in size without focal abnormality. Adrenals/Urinary Tract: Adrenal glands are normal. Kidneys show no hydronephrosis. The bladder is unremarkable Stomach/Bowel: Postsurgical changes of the stomach consistent with gastro jejunostomy. No obstructive features. Soft tissue thickening at the second portion of duodenum as before. Remainder of the small bowel is nondistended. Moderate to large stool burden. Negative appendix. Diverticular disease of the left colon Vascular/Lymphatic: Advanced aortic atherosclerosis. No aneurysm. No suspicious lymph node Reproductive: Uterus and bilateral adnexa are unremarkable. Other: Negative for pelvic effusion or free air Musculoskeletal: Degenerative changes of the spine. No acute osseous abnormality IMPRESSION: 1. No evidence for bowel obstruction on the current exam. No evidence for new bowel wall thickening. Status post gastrojejunostomy without adverse features at this time. Redemonstrated soft tissue thickening/abnormality in the region of the second portion of duodenum 2. Similar positioning of internal external biliary drainage  catheter without significant biliary dilatation 3. Redemonstrated mild irregular density at the inferior left breast for which outpatient mammography and ultrasound was previously advised Electronically Signed   By: Donavan Foil M.D.   On: 11/01/2021 18:58   US Venous Img Lower Bilateral (DVT)  Result Date: 11/02/2021 CLINICAL DATA:  Leg swelling x1 day EXAM: BILATERAL LOWER EXTREMITY VENOUS DOPPLER  ULTRASOUND TECHNIQUE: Gray-scale sonography with compression, as well as color and duplex ultrasound, were performed to evaluate the deep venous system(s) from the level of the common femoral vein through the popliteal and proximal calf veins. COMPARISON:  None Available. FINDINGS: VENOUS Normal compressibility of the common femoral, superficial femoral, and popliteal veins, as well as the visualized calf veins. Visualized portions of profunda femoral vein and great saphenous vein unremarkable. No filling defects to suggest DVT on grayscale or color Doppler imaging. Doppler waveforms show normal direction of venous flow, normal respiratory plasticity and response to augmentation. OTHER None. Limitations: none IMPRESSION: Negative. Electronically Signed   By: Julian Hy M.D.   On: 11/02/2021 00:16   DG Chest Portable 1 View  Result Date: 11/01/2021 CLINICAL DATA:  Provided history: Shortness of breath. Additional history provided: Patient reports nausea/vomiting. History of breast cancer and hypertension. EXAM: PORTABLE CHEST 1 VIEW COMPARISON:  Same day CT abdomen/pelvis 11/01/2021. Prior chest radiographs 10/07/2021 and earlier. FINDINGS: A small portion of the left lateral costophrenic angle is excluded from the field of view. Heart size within normal limits. Foci of linear atelectasis versus scarring within the bilateral lung bases. No appreciable airspace consolidation or pulmonary edema. No evidence of pleural effusion or pneumothorax. No acute bony abnormality identified. IMPRESSION: A small portion  of the left lateral costophrenic angle is excluded from the field of view. Foci of linear atelectasis versus scarring within the bilateral lung bases. No appreciable airspace consolidation or pulmonary edema. Electronically Signed   By: Kellie Simmering D.O.   On: 11/01/2021 18:56     Assessment and plan- Patient is a 66 y.o. female with metastatic lobular breast cancer with gastric outlet obstruction secondary to duodenal metastases here for routine follow-up   Metastatic lobular breast cancer ER positive: She is continuing letrozole presently. Will discuss plan with Dr. Janese Banks at her follow up on June 12th. For now patient is focusing on improving and enjoying feeling well.    Gastric outlet obstruction and obstructive jaundice: Secondary to duodenal metastases causing duodenal wall thickening.  She is s/p palliative gastrojejunostomy with resolution of the obstruction.  Also for her obstructive jaundice she has an external biliary drain which was recently exchanged and she is scheduled for repeat exchange in June 2023. Both reported to be draining well. Her LFTS are essentially stable with improved Alk Phos. CBC normal. Will continue to monitor.    Visit Diagnosis 1. Breast cancer metastasized to small intestine, left ([place])   2. Malnutrition compromising bodily function ([place])   3. Hyperkalemia   4. History of recent hospitalization   5. Gastric outlet obstruction   6. Severe sepsis ([place])      Nelwyn Salisbury PA-C [facility] 11/10/2021 10:54 AM

## 2021-11-21 ENCOUNTER — Telehealth: Payer: Self-pay | Admitting: Pharmacy Technician

## 2021-11-21 ENCOUNTER — Inpatient Hospital Stay: Payer: Medicare HMO | Admitting: Pharmacist

## 2021-11-21 ENCOUNTER — Other Ambulatory Visit: Payer: Self-pay

## 2021-11-21 ENCOUNTER — Other Ambulatory Visit (HOSPITAL_COMMUNITY): Payer: Self-pay

## 2021-11-21 ENCOUNTER — Inpatient Hospital Stay: Payer: Medicare HMO | Admitting: Oncology

## 2021-11-21 ENCOUNTER — Encounter: Payer: Self-pay | Admitting: Oncology

## 2021-11-21 ENCOUNTER — Inpatient Hospital Stay: Payer: Medicare HMO

## 2021-11-21 VITALS — BP 128/77 | HR 87 | Temp 97.8°F | Resp 20 | Wt 119.0 lb

## 2021-11-21 DIAGNOSIS — A419 Sepsis, unspecified organism: Secondary | ICD-10-CM | POA: Diagnosis not present

## 2021-11-21 DIAGNOSIS — C50912 Malignant neoplasm of unspecified site of left female breast: Secondary | ICD-10-CM | POA: Diagnosis not present

## 2021-11-21 DIAGNOSIS — Z79899 Other long term (current) drug therapy: Secondary | ICD-10-CM | POA: Diagnosis not present

## 2021-11-21 DIAGNOSIS — Z79811 Long term (current) use of aromatase inhibitors: Secondary | ICD-10-CM | POA: Diagnosis not present

## 2021-11-21 DIAGNOSIS — Z170 Estrogen receptor positive status [ER+]: Secondary | ICD-10-CM | POA: Diagnosis not present

## 2021-11-21 DIAGNOSIS — C784 Secondary malignant neoplasm of small intestine: Secondary | ICD-10-CM

## 2021-11-21 DIAGNOSIS — E785 Hyperlipidemia, unspecified: Secondary | ICD-10-CM | POA: Diagnosis not present

## 2021-11-21 DIAGNOSIS — K311 Adult hypertrophic pyloric stenosis: Secondary | ICD-10-CM | POA: Diagnosis not present

## 2021-11-21 LAB — CBC WITH DIFFERENTIAL/PLATELET
Abs Immature Granulocytes: 0.05 10*3/uL (ref 0.00–0.07)
Basophils Absolute: 0.1 10*3/uL (ref 0.0–0.1)
Basophils Relative: 1 %
Eosinophils Absolute: 0.1 10*3/uL (ref 0.0–0.5)
Eosinophils Relative: 1 %
HCT: 38.9 % (ref 36.0–46.0)
Hemoglobin: 13.9 g/dL (ref 12.0–15.0)
Immature Granulocytes: 1 %
Lymphocytes Relative: 25 %
Lymphs Abs: 2.3 10*3/uL (ref 0.7–4.0)
MCH: 31.3 pg (ref 26.0–34.0)
MCHC: 35.7 g/dL (ref 30.0–36.0)
MCV: 87.6 fL (ref 80.0–100.0)
Monocytes Absolute: 0.7 10*3/uL (ref 0.1–1.0)
Monocytes Relative: 7 %
Neutro Abs: 6.2 10*3/uL (ref 1.7–7.7)
Neutrophils Relative %: 65 %
Platelets: 288 10*3/uL (ref 150–400)
RBC: 4.44 MIL/uL (ref 3.87–5.11)
RDW: 14.3 % (ref 11.5–15.5)
WBC: 9.4 10*3/uL (ref 4.0–10.5)
nRBC: 0 % (ref 0.0–0.2)

## 2021-11-21 LAB — COMPREHENSIVE METABOLIC PANEL
ALT: 193 U/L — ABNORMAL HIGH (ref 0–44)
AST: 110 U/L — ABNORMAL HIGH (ref 15–41)
Albumin: 4.4 g/dL (ref 3.5–5.0)
Alkaline Phosphatase: 147 U/L — ABNORMAL HIGH (ref 38–126)
Anion gap: 10 (ref 5–15)
BUN: 46 mg/dL — ABNORMAL HIGH (ref 8–23)
CO2: 17 mmol/L — ABNORMAL LOW (ref 22–32)
Calcium: 9.8 mg/dL (ref 8.9–10.3)
Chloride: 104 mmol/L (ref 98–111)
Creatinine, Ser: 0.88 mg/dL (ref 0.44–1.00)
GFR, Estimated: >60 mL/min (ref >60)
Glucose, Bld: 149 mg/dL — ABNORMAL HIGH (ref 70–99)
Potassium: 3.4 mmol/L — ABNORMAL LOW (ref 3.5–5.1)
Sodium: 131 mmol/L — ABNORMAL LOW (ref 135–145)
Total Bilirubin: 0.9 mg/dL (ref 0.3–1.2)
Total Protein: 8 g/dL (ref 6.5–8.1)

## 2021-11-21 MED ORDER — PALBOCICLIB 75 MG PO TABS
75.0000 mg | ORAL_TABLET | Freq: Every day | ORAL | 0 refills | Status: DC
  Filled 2021-11-21 – 2021-11-22 (×2): qty 21, 21d supply, fill #0

## 2021-11-21 NOTE — Progress Notes (Signed)
[place]  Telephone:([phone] [phone]  Patient Care Team: Virginia Crews, MD as PCP - General (Family Medicine) Theodore Demark, RN (Inactive) as Oncology Nurse Navigator   Name of the patient: Diamond Hinton  [id number]  25-Dec-1955   Date of visit: 11/21/21  HPI: Patient is a 66 y.o. female with metastatic breast cancer, ER positive, HER2 and PR negative. Patient was previously on ribociclib, but this was stopped due to intolerance (nausea and AKI). The plan it to switch her to treatment with Ibrance (palbociclib) she will continue to take letrozole.   Reason for Consult: Ibrance (palbociclib) oral chemotherapy education.   PAST MEDICAL HISTORY: Past Medical History:  Diagnosis Date   Anemia    Anxiety    Breast cancer ([place])    Gallstones    GERD (gastroesophageal reflux disease)    Hyperlipidemia    Hypertension    Jaundice 08/29/2021   Rash 08/29/2021   Right upper quadrant abdominal pain 08/29/2021   UTI (urinary tract infection) 08/29/2021    HEMATOLOGY/ONCOLOGY HISTORY:  Oncology History  Primary malignant neoplasm of breast with metastasis ([place])  08/29/2021 Initial Diagnosis   Metastatic breast cancer ([place])   09/08/2021 -  Chemotherapy   Patient is on Treatment Plan : BREAST Abemaciclib + Fulvestrant q28d     09/20/2021 Cancer Staging   Staging form: Breast, AJCC 8th Edition - Pathologic stage from 09/20/2021: Stage IV (pT1, pNX, pM1, G3, ER+, PR+, HER2-) - Signed by Sindy Guadeloupe, MD on 09/20/2021 Multigene prognostic tests performed: None Histologic grading system: 3 grade system     ALLERGIES:  is allergic to peanuts [peanut oil].  MEDICATIONS:  Current Outpatient Medications  Medication Sig Dispense Refill   palbociclib (IBRANCE) 75 MG tablet Take 1 tablet (75 mg total) by mouth daily. Take for 21 days on, 7 days off, repeat every 28 days. 21 tablet 0   acetaminophen (TYLENOL) 325 MG tablet Take  325 mg by mouth every 6 (six) hours as needed (for pain and sometimes takes 2 if pain is worse).     feeding supplement (ENSURE ENLIVE / ENSURE PLUS) LIQD Take 237 mLs by mouth 3 (three) times daily between meals. 237 mL 12   letrozole (FEMARA) 2.5 MG tablet Take 1 tablet (2.5 mg total) by mouth daily. 30 tablet 3   levothyroxine (SYNTHROID) 88 MCG tablet Take 1 tablet (88 mcg total) by mouth daily. 90 tablet 1   Multiple Vitamins-Minerals (CENTRUM VITAMINTS PO) Take 1 tablet by mouth daily.     ondansetron (ZOFRAN) 4 MG tablet Take 1 tablet (4 mg total) by mouth daily as needed for nausea or vomiting. 30 tablet 1   prochlorperazine (COMPAZINE) 5 MG tablet Take 1 tablet (5 mg total) by mouth every 6 (six) hours as needed for refractory nausea / vomiting. 30 tablet 1   Simethicone (GAS-X PO) Take by mouth. PRN (Patient not taking: Reported on 11/21/2021)     traZODone (DESYREL) 50 MG tablet Take 1 tablet (50 mg total) by mouth at bedtime as needed for sleep. (Patient not taking: Reported on 11/10/2021) 30 tablet 0   No current facility-administered medications for this visit.    VITAL SIGNS: There were no vitals taken for this visit. There were no vitals filed for this visit.  Estimated body mass index is 21.08 kg/m as calculated from the following:   Height as of 11/01/21: _0  (1.6 m).   Weight as of an earlier encounter on  11/21/21: 54 kg (119 lb).  LABS: CBC:    Component Value Date/Time   WBC 9.4 11/21/2021 1443   HGB 13.9 11/21/2021 1443   HGB 15.4 12/01/2015 0829   HCT 38.9 11/21/2021 1443   HCT 43.6 12/01/2015 0829   PLT 288 11/21/2021 1443   PLT 258 12/01/2015 0829   MCV 87.6 11/21/2021 1443   MCV 85 12/01/2015 0829   NEUTROABS 6.2 11/21/2021 1443   NEUTROABS 5.0 12/01/2015 0829   LYMPHSABS 2.3 11/21/2021 1443   LYMPHSABS 2.4 12/01/2015 0829   MONOABS 0.7 11/21/2021 1443   EOSABS 0.1 11/21/2021 1443   EOSABS 0.1 12/01/2015 0829   BASOSABS 0.1 11/21/2021 1443   BASOSABS  0.0 12/01/2015 0829   Comprehensive Metabolic Panel:    Component Value Date/Time   NA 131 (L) 11/21/2021 1443   NA 138 10/18/2021 0903   K 3.4 (L) 11/21/2021 1443   CL 104 11/21/2021 1443   CO2 17 (L) 11/21/2021 1443   BUN 46 (H) 11/21/2021 1443   BUN 28 (H) 10/18/2021 0903   CREATININE 0.88 11/21/2021 1443   GLUCOSE 149 (H) 11/21/2021 1443   CALCIUM 9.8 11/21/2021 1443   AST 110 (H) 11/21/2021 1443   ALT 193 (H) 11/21/2021 1443   ALKPHOS 147 (H) 11/21/2021 1443   BILITOT 0.9 11/21/2021 1443   BILITOT 0.8 10/18/2021 0903   PROT 8.0 11/21/2021 1443   PROT 7.8 10/18/2021 0903   ALBUMIN 4.4 11/21/2021 1443   ALBUMIN 4.8 10/18/2021 0277     Present during today's visit: patient and her daughter  Start plan: Patient will start her palbociclib on 11/24/21  CBC/CMP from 11/21/21 assessed, AST/ALT currently elevated, will continue to monitor and start patient on a reduced dose of palbociclib. Prescription dose and frequency assessed.   Patient Education I spoke with patient for overview of new oral chemotherapy medication: palbociclib   Administration: Counseled patient on administration, dosing, side effects, monitoring, drug-food interactions, safe handling, storage, and disposal. Patient will take 1 tablet (75 mg total) by mouth daily. Take for 21 days on, 7 days off, repeat every 28 days.  Side Effects: Side effects include but not limited to: fatigue, decreased wbc/hgb/plt, nausea, mouth sores, changes on liver function.    Drug-drug Interactions (DDI): No DDIs with palbociclib  Adherence: After discussion with patient no patient barriers to medication adherence identified.  Reviewed with patient importance of keeping a medication schedule and plan for any missed doses.  Ms. Bjelland voiced understanding and appreciation. All questions answered. Medication handout provided.  Provided patient with [facility] phone number. Patient knows to call the  office with questions or concerns. Oral Chemotherapy [facility] will continue to follow.  Patient expressed understanding and was in agreement with this plan. She also understands that She can call clinic at any time with any questions, concerns, or complaints.   Medication Access Issues: Patient will be started using a one month free voucher. During today's visit, she sign manufacturer assistance paperwork  Follow-up plan: RTC next week for labs  Thank you for allowing me to participate in the care of this patient.   Time Total: 20 mins  Visit consisted of counseling and education on dealing with issues of symptom management in the setting of serious and potentially life-threatening illness.Greater than 50%  of this time was spent counseling and coordinating care related to the above assessment and plan.  Signed by: Darl Pikes, PharmD, BCPS, BCOP, CPP Hematology/Oncology Clinical Pharmacist Practitioner [place]/DB/AP Oral  [facility] [phone]  11/21/2021 4:22 PM

## 2021-11-21 NOTE — Telephone Encounter (Addendum)
Oral Oncology Patient Advocate Encounter  Met patient after appointment today to complete application for [employer] Patient Oncology Together in an effort to reduce patient's out of pocket expense for Ibrance to $0.    Application completed and faxed to [phone].   [employer] patient assistance phone number for follow up is [phone].   This encounter will be updated until final determination.   [place] Patient [place] Phone [phone] Fax [phone] 11/21/2021 4:18 PM

## 2021-11-22 ENCOUNTER — Other Ambulatory Visit (HOSPITAL_COMMUNITY): Payer: Self-pay

## 2021-11-22 ENCOUNTER — Encounter: Payer: Self-pay | Admitting: Oncology

## 2021-11-22 LAB — CANCER ANTIGEN 15-3: CA 15-3: 36.8 U/mL — ABNORMAL HIGH (ref 0.0–25.0)

## 2021-11-22 LAB — CANCER ANTIGEN 27.29: CA 27.29: 33.9 U/mL (ref 0.0–38.6)

## 2021-11-23 NOTE — Telephone Encounter (Signed)
Oral Oncology Patient Advocate Encounter  Received notification from [facility] that patient has been successfully enrolled into their program to receive Ibrance from the manufacturer at $0 out of pocket until 06/11/22.    Specialty Pharmacy that will dispense medication is [facility].  Patient knows to call the office with questions or concerns.   Oral Oncology Clinic will continue to follow.  [place] Patient [place] Phone [phone] Fax [phone] 11/23/2021 1:47 PM

## 2021-11-24 ENCOUNTER — Inpatient Hospital Stay: Payer: Medicare HMO | Admitting: Licensed Clinical Social Worker

## 2021-11-24 ENCOUNTER — Inpatient Hospital Stay: Payer: Medicare HMO

## 2021-11-24 DIAGNOSIS — C50919 Malignant neoplasm of unspecified site of unspecified female breast: Secondary | ICD-10-CM

## 2021-11-24 NOTE — Progress Notes (Signed)
Nutrition Follow-up:  Patient with metastatic lobular left breast cancer with duodenal wall thickening presenting with gastric outlet obstruction.  S/p robotic assisted lap gastrojejunostomy on 3/30.  Treatment has been changed to ibrance.  Noted hospital admission for AKI  Spoke with patient via phone.  Patient says that she is suppose to start ibrance today.  Has been having issues with nausea.  Not taking nausea medication on regular basis.  Planning to start nausea medication today with starting new chemo pill.  Says that she typically has bowel movement daily. May have one every other day or every 2 days (rare).  Sometimes stool harder to pass but generally not.  Has been trying to stay hydrated.  Drinking ensure max protein shake.      Medications: zofran, compazine  Labs: reviewed  Anthropometrics:   Weight 119 lb on 6/12  125 lb on 5/11 129 lb on 4/11 142 lb on 04/18/21   NUTRITION DIAGNOSIS: Inadequate oral intake ongoing   INTERVENTION:  Encouraged taking nausea medication on regular basis.  Patient aware she can alternate zofran and compazine Discussed foods to choose with nausea.  Will mail handout Discussed ensure clear as sometimes says milky ensure harder to get down.    MONITORING, EVALUATION, GOAL: weight trends, intake   NEXT VISIT: Thursday, July 20 phone  Samanthajo Payano B. Zenia Resides, [place], [place] Registered Dietitian [phone]

## 2021-11-24 NOTE — Progress Notes (Signed)
[place] Work  Initial Assessment   Diamond Hinton is a 66 y.o. year old female contacted by phone. Clinical Social Work was referred by medical provider for assessment of psychosocial needs.   SDOH (Social Determinants of Health) assessments performed: Yes SDOH Interventions    Flowsheet Row Most Recent Value  SDOH Interventions   Food Insecurity Interventions Intervention Not Indicated  Financial Strain Interventions Intervention Not Indicated  Housing Interventions Intervention Not Indicated  Stress Interventions Intervention Not Indicated  Social Connections Interventions Intervention Not Indicated  Transportation Interventions Patient Resources (Friends/Family), Intervention Not Indicated       SDOH Screenings   Alcohol Screen: Low Risk  (10/18/2021)   Alcohol Screen    Last Alcohol Screening Score (AUDIT): 0  Depression (PHQ2-9): Low Risk  (10/18/2021)   Depression (PHQ2-9)    PHQ-2 Score: 4  Financial Resource Strain: Low Risk  (11/24/2021)   Overall Financial Resource Strain (CARDIA)    Difficulty of Paying Living Expenses: Not hard at all  Food Insecurity: No Food Insecurity (11/24/2021)   Hunger Vital Sign    Worried About Running Out of Food in the Last Year: Never true    [place] in the Last Year: Never true  Housing: Low Risk  (11/24/2021)   Housing    Last Housing Risk Score: 0  Physical Activity: Not on file  Social Connections: Unknown (11/24/2021)   Social Connection and Isolation Panel [NHANES]    Frequency of Communication with Friends and Family: Three times a week    Frequency of Social Gatherings with Friends and Family: Three times a week    Attends Religious Services: Patient refused    Active Member of Clubs or Organizations: Patient refused    Attends Archivist Meetings: Patient refused    Marital Status: Married  Stress: No Stress Concern Present (11/24/2021)   [place]    Feeling of Stress : Only a little  Tobacco Use: Low Risk  (11/21/2021)   Patient History    Smoking Tobacco Use: Never    Smokeless Tobacco Use: Never    Passive Exposure: Not on file  Transportation Needs: No Transportation Needs (11/24/2021)   PRAPARE - Transportation    Lack of Transportation (Medical): No    Lack of Transportation (Non-Medical): No     Distress Screen completed: No     No data to display            Family/Social Information:  Housing Arrangement: patient lives with spouse,  main caregiver is her daughter Diamond Hinton [phone]. Family members/support persons in your life? Family, Friends, Education administrator, and Geophysical data processor concerns: no  Employment: Retired .[employer]  Income source: Paediatric nurse concerns: No Type of concern: None Food access concerns: no Religious or spiritual practice: Not known Services Currently in place:  [employer]  Coping/ Adjustment to diagnosis: Patient understands treatment plan and what happens next? yes Concerns about diagnosis and/or treatment: I'm not especially worried about anything Patient reported stressors:  No reported stressors, patient's main concern is eating and "getting calories in", patient spoke with Nutritionist this morning. Hopes and/or priorities: to get enough calories Patient enjoys  N/A Current coping skills/ strengths: Capable of independent living , Communication skills , Scientist, research (life sciences) , Motivation for treatment/growth , and Supportive family/friends     SUMMARY: Current SDOH Barriers:  No SDOH barriers at this identified  Clinical Social Work Clinical Goal(s):  No clinical social work goals at this time  Interventions: Discussed common feeling and emotions when being diagnosed with cancer, and the importance of support during treatment Informed patient of the support team roles and support services at [facility] Provided CSW contact  information and encouraged patient to call with any questions or concerns Provided patient with information about CSW role in patient care and available resources.  Patient stated she did not have an needs.  CSW informed patient about transportation services, incase she needed transportation assistance in the future CSW gave patient contact information and encouraged patient to contact CSW for id she had any questions or concerns. Patient verbalized understanding.    Follow Up Plan: Patient will contact CSW with any support or resource needs Patient verbalizes understanding of plan: Yes    Jevante Hollibaugh, LCSW

## 2021-11-26 ENCOUNTER — Encounter: Payer: Self-pay | Admitting: Oncology

## 2021-11-26 NOTE — Progress Notes (Signed)
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  She was found to have diffuse duodenal wall thickening as well as a palpable mass in the left breast.  Duodenal biopsy as well as left breast biopsy was consistent withER positive greater than 90%, PR weakly +1 to 10% HER2 negative lobular breast cancer.  Patient underwent external biliary drain for the obstructive jaundice and palliative gastrojejunostomy surgery to relieve her gastric outlet obstruction.CT scan otherwise did not show any evidence of distant metastatic disease.  Patient was started on letrozole plus Kisqali in early April 2023.  Interval history-patient reports feeling better after coming off to [facility].  She continues to take letrozole without any significant side effects.  Appetite is fairDenies any significant nausea or vomiting.  She has however lost 2 more pounds as compared to 2 weeks ago.  Reports occasional abdominal pain but has not required any pain medicines.  She has had some trouble with her external biliary drain which at times has stopped working but she knows how to reposition it and to get it start working again.  ECOG PS- 1 Pain scale- 2  Review of systems- Review of Systems  Constitutional:  Positive for malaise/fatigue and weight loss. Negative for chills and fever.  HENT:   Negative for congestion, ear discharge and nosebleeds.   Eyes:  Negative for blurred vision.  Respiratory:  Negative for cough, hemoptysis, sputum production, shortness of breath and wheezing.   Cardiovascular:  Negative for chest pain, palpitations, orthopnea and claudication.  Gastrointestinal:  Negative for abdominal pain, blood in stool, constipation, diarrhea, heartburn, melena, nausea and vomiting.  Genitourinary:  Negative for dysuria, flank pain, frequency, hematuria and urgency.  Musculoskeletal:  Negative for back pain, joint pain and myalgias.  Skin:  Negative for rash.  Neurological:  Negative for dizziness, tingling, focal weakness, seizures, weakness and headaches.  Endo/Heme/Allergies:  Does not bruise/bleed easily.  Psychiatric/Behavioral:  Negative for depression and suicidal ideas. The patient does not have insomnia.       Allergies  Allergen Reactions   Peanuts [Peanut Oil] Shortness Of Breath and Itching     Past Medical History:  Diagnosis Date   Anemia    Anxiety    Breast cancer ([place])    Gallstones    GERD (gastroesophageal reflux disease)    Hyperlipidemia    Hypertension    Jaundice 08/29/2021   Rash 08/29/2021   Right upper quadrant abdominal pain 08/29/2021   UTI (urinary tract infection) 08/29/2021     Past Surgical History:  Procedure Laterality Date   BILATERAL SALPINGECTOMY  04/03/1997   CHOLECYSTECTOMY N/A 07/07/2015   Procedure: LAPAROSCOPIC CHOLECYSTECTOMY ;  Surgeon: Jules Husbands, MD;  Location: [facility] ORS;  Service: General;  Laterality: N/A;   ESOPHAGOGASTRODUODENOSCOPY  08/30/2021   Procedure: ESOPHAGOGASTRODUODENOSCOPY (EGD);  Surgeon: Lucilla Lame, MD;  Location: [facility] ENDOSCOPY;  Service: Endoscopy;;   GALLBLADDER SURGERY  07/07/2015   [place]  IR BILIARY DRAIN PLACEMENT WITH CHOLANGIOGRAM  08/31/2021   IR CONVERT BILIARY DRAIN TO INT EXT BILIARY DRAIN  09/02/2021   IR EXCHANGE BILIARY DRAIN  10/10/2021    Social History   Socioeconomic  History   Marital status: Married    Spouse name: Not on file   Number of children: Not on file   Years of education: Not on file   Highest education level: Not on file  Occupational History   Not on file  Tobacco Use   Smoking status: Never   Smokeless tobacco: Never  Vaping Use   Vaping Use: Never used  Substance and Sexual Activity   Alcohol use: No   Drug use: No   Sexual activity: Yes  Other Topics Concern   Not on file  Social History Narrative   Not on file   Social Determinants of Health   Financial Resource Strain: Low Risk  (11/24/2021)   Overall Financial Resource Strain (CARDIA)    Difficulty of Paying Living Expenses: Not hard at all  Food Insecurity: No Food Insecurity (11/24/2021)   Hunger Vital Sign    Worried About Running Out of Food in the Last Year: Never true    [place] in the Last Year: Never true  Transportation Needs: No Transportation Needs (11/24/2021)   PRAPARE - Hydrologist (Medical): No    Lack of Transportation (Non-Medical): No  Physical Activity: Not on file  Stress: No Stress Concern Present (11/24/2021)   [place]    Feeling of Stress : Only a little  Social Connections: Unknown (11/24/2021)   Social Connection and Isolation Panel [NHANES]    Frequency of Communication with Friends and Family: Three times a week    Frequency of Social Gatherings with Friends and Family: Three times a week    Attends Religious Services: Patient refused    Active Member of Clubs or Organizations: Patient refused    Attends Archivist Meetings: Patient refused    Marital Status: Married  Human resources officer Violence: Not on file    Family History  Problem Relation Age of Onset   Hypertension Mother    CVA Mother    Ulcers Mother    Emphysema Mother    Heart disease Father    Prostate cancer Father    Macular degeneration Father     Hypertension Sister    Diabetes Sister    Cervical polyp Sister    Hypertension Sister    Diabetes Sister    Lymphoma Sister      Current Outpatient Medications:    acetaminophen (TYLENOL) 325 MG tablet, Take 325 mg by mouth every 6 (six) hours as needed (for pain and sometimes takes 2 if pain is worse)., Disp: , Rfl:    feeding supplement (ENSURE ENLIVE / ENSURE PLUS) LIQD, Take 237 mLs by mouth 3 (three) times daily between meals., Disp: 237 mL, Rfl: 12   letrozole (FEMARA) 2.5 MG tablet, Take 1 tablet (2.5 mg total) by mouth daily., Disp: 30 tablet, Rfl: 3   levothyroxine (SYNTHROID) 88 MCG tablet, Take 1 tablet (88 mcg total) by mouth daily., Disp: 90 tablet, Rfl: 1   Multiple Vitamins-Minerals (CENTRUM VITAMINTS PO), Take 1 tablet by mouth daily., Disp: , Rfl:    ondansetron (ZOFRAN) 4 MG tablet, Take 1 tablet (4 mg total) by mouth daily as needed for nausea or vomiting., Disp: 30 tablet, Rfl: 1   prochlorperazine (  COMPAZINE) 5 MG tablet, Take 1 tablet (5 mg total) by mouth every 6 (six) hours as needed for refractory nausea / vomiting., Disp: 30 tablet, Rfl: 1   palbociclib (IBRANCE) 75 MG tablet, Take 1 tablet (75 mg total) by mouth daily. Take for 21 days on, 7 days off, repeat every 28 days., Disp: 21 tablet, Rfl: 0   Simethicone (GAS-X PO), Take by mouth. PRN (Patient not taking: Reported on 11/21/2021), Disp: , Rfl:    traZODone (DESYREL) 50 MG tablet, Take 1 tablet (50 mg total) by mouth at bedtime as needed for sleep. (Patient not taking: Reported on 11/10/2021), Disp: 30 tablet, Rfl: 0  Physical exam:  Vitals:   11/21/21 1451  BP: 128/77  Pulse: 87  Resp: 20  Temp: 97.8 F (36.6 C)  SpO2: 97%  Weight: 119 lb (54 kg)   Physical Exam Constitutional:      General: She is not in acute distress. Cardiovascular:     Rate and Rhythm: Normal rate and regular rhythm.     Heart sounds: Normal heart sounds.  Pulmonary:     Effort: Pulmonary effort is normal.     Breath sounds:  Normal breath sounds.  Abdominal:     General: Bowel sounds are normal. There is no distension.     Palpations: Abdomen is soft.     Tenderness: There is no abdominal tenderness.     Comments: External biliary drain in place  Skin:    General: Skin is warm and dry.  Neurological:     Mental Status: She is alert and oriented to person, place, and time.         Latest Ref Rng & Units 11/21/2021    2:43 PM  CMP  Glucose 70 - 99 mg/dL 149   BUN 8 - 23 mg/dL 46   Creatinine 0.44 - 1.00 mg/dL 0.88   Sodium 135 - 145 mmol/L 131   Potassium 3.5 - 5.1 mmol/L 3.4   Chloride 98 - 111 mmol/L 104   CO2 22 - 32 mmol/L 17   Calcium 8.9 - 10.3 mg/dL 9.8   Total Protein 6.5 - 8.1 g/dL 8.0   Total Bilirubin 0.3 - 1.2 mg/dL 0.9   Alkaline Phos 38 - 126 U/L 147   AST 15 - 41 U/L 110   ALT 0 - 44 U/L 193       Latest Ref Rng & Units 11/21/2021    2:43 PM  CBC  WBC 4.0 - 10.5 K/uL 9.4   Hemoglobin 12.0 - 15.0 g/dL 13.9   Hematocrit 36.0 - 46.0 % 38.9   Platelets 150 - 400 K/uL 288     No images are attached to the encounter.  US Venous Img Lower Bilateral (DVT)  Result Date: 11/02/2021 CLINICAL DATA:  Leg swelling x1 day EXAM: BILATERAL LOWER EXTREMITY VENOUS DOPPLER ULTRASOUND TECHNIQUE: Gray-scale sonography with compression, as well as color and duplex ultrasound, were performed to evaluate the deep venous system(s) from the level of the common femoral vein through the popliteal and proximal calf veins. COMPARISON:  None Available. FINDINGS: VENOUS Normal compressibility of the common femoral, superficial femoral, and popliteal veins, as well as the visualized calf veins. Visualized portions of profunda femoral vein and great saphenous vein unremarkable. No filling defects to suggest DVT on grayscale or color Doppler imaging. Doppler waveforms show normal direction of venous flow, normal respiratory plasticity and response to augmentation. OTHER None. Limitations: none IMPRESSION: Negative.  Electronically Signed   By: Bertis Ruddy  [place] Pink M.D.   On: 11/02/2021 00:16   CT ABDOMEN PELVIS WO CONTRAST  Result Date: 11/01/2021 CLINICAL DATA:  Nausea vomiting EXAM: CT ABDOMEN AND PELVIS WITHOUT CONTRAST TECHNIQUE: Multidetector CT imaging of the abdomen and pelvis was performed following the standard protocol without IV contrast. RADIATION DOSE REDUCTION: This exam was performed according to the departmental dose-optimization program which includes automated exposure control, adjustment of the mA and/or kV according to patient size and/or use of iterative reconstruction technique. COMPARISON:  CT 10/07/2021, 08/29/2021 FINDINGS: Lower chest: Improved aeration at the lung bases with residual scarring. No acute consolidation or pleural effusion. Coronary vascular calcification. Mildly irregular density within the inferior left breast as before. Hepatobiliary: Internal external biliary drainage catheter with pigtail in the second portion of the duodenum. Status post cholecystectomy. No significant biliary dilatation. Pancreas: Unremarkable. No pancreatic ductal dilatation or surrounding inflammatory changes. Spleen: Normal in size without focal abnormality. Adrenals/Urinary Tract: Adrenal glands are normal. Kidneys show no hydronephrosis. The bladder is unremarkable Stomach/Bowel: Postsurgical changes of the stomach consistent with gastro jejunostomy. No obstructive features. Soft tissue thickening at the second portion of duodenum as before. Remainder of the small bowel is nondistended. Moderate to large stool burden. Negative appendix. Diverticular disease of the left colon Vascular/Lymphatic: Advanced aortic atherosclerosis. No aneurysm. No suspicious lymph node Reproductive: Uterus and bilateral adnexa are unremarkable. Other: Negative for pelvic effusion or free air Musculoskeletal: Degenerative changes of the spine. No acute osseous abnormality IMPRESSION: 1. No evidence for bowel obstruction on the  current exam. No evidence for new bowel wall thickening. Status post gastrojejunostomy without adverse features at this time. Redemonstrated soft tissue thickening/abnormality in the region of the second portion of duodenum 2. Similar positioning of internal external biliary drainage catheter without significant biliary dilatation 3. Redemonstrated mild irregular density at the inferior left breast for which outpatient mammography and ultrasound was previously advised Electronically Signed   By: Donavan Foil M.D.   On: 11/01/2021 18:58   DG Chest Portable 1 View  Result Date: 11/01/2021 CLINICAL DATA:  Provided history: Shortness of breath. Additional history provided: Patient reports nausea/vomiting. History of breast cancer and hypertension. EXAM: PORTABLE CHEST 1 VIEW COMPARISON:  Same day CT abdomen/pelvis 11/01/2021. Prior chest radiographs 10/07/2021 and earlier. FINDINGS: A small portion of the left lateral costophrenic angle is excluded from the field of view. Heart size within normal limits. Foci of linear atelectasis versus scarring within the bilateral lung bases. No appreciable airspace consolidation or pulmonary edema. No evidence of pleural effusion or pneumothorax. No acute bony abnormality identified. IMPRESSION: A small portion of the left lateral costophrenic angle is excluded from the field of view. Foci of linear atelectasis versus scarring within the bilateral lung bases. No appreciable airspace consolidation or pulmonary edema. Electronically Signed   By: Kellie Simmering D.O.   On: 11/01/2021 18:56     Assessment and plan- Patient is a 66 y.o. female with metastatic ER positive lobular breast cancer with the sole site of metastases being duodenal obstruction s/p palliative gastrojejunostomy.  Patient also had evidence of obstructive jaundice s/p external biliary drain placement. She is here for routine follow-up  With regards to her metastatic breast cancer patient will continue  letrozole until progression or toxicity.  Patient has not tolerated previous kisqali well and had 2 episodes of Hospitalization with evidence of AKI as well as significant hyperkalemia and hyperuricemia.  She has therefore been taken off kisqali.  To start her on the lowest dose of Ibrance 75 mg  which she will take 3 weeks on and 1 week off  Abnormal LFTs:No evidence of obstructive jaundice and her total bilirubin is normal.  AST and ALT had normalized on 11/03/2018 and presently elevated at 147 and 110 respectively.  Alkaline phosphatase elevated at 193.  She has had a recent CT abdomen in the hospital as well which did not show any acute pathology in the liver.  She would be due for a PTC exchange on 12/05/2021.  Unclear of the abnormal LFTs are secondary to that.  If LFTs do not get better despite exchange of the biliary drain I will reach out to DR. Wohl.  We will continue checking her CMP once a week and I will see her back in 4 weeks with CBC with differential and CMP   Visit Diagnosis 1. Breast cancer metastasized to small intestine, left ([place])   2. High risk medication use   3. Use of letrozole (Femara)      Dr. Randa Evens, MD, MPH [facility] [phone] 11/26/2021 9:12 AM

## 2021-11-29 ENCOUNTER — Other Ambulatory Visit (HOSPITAL_COMMUNITY): Payer: Self-pay

## 2021-11-30 ENCOUNTER — Other Ambulatory Visit: Payer: Medicare HMO

## 2021-11-30 ENCOUNTER — Inpatient Hospital Stay: Payer: Medicare HMO

## 2021-11-30 ENCOUNTER — Other Ambulatory Visit: Payer: Self-pay

## 2021-11-30 DIAGNOSIS — E785 Hyperlipidemia, unspecified: Secondary | ICD-10-CM | POA: Diagnosis not present

## 2021-11-30 DIAGNOSIS — Z170 Estrogen receptor positive status [ER+]: Secondary | ICD-10-CM | POA: Diagnosis not present

## 2021-11-30 DIAGNOSIS — A419 Sepsis, unspecified organism: Secondary | ICD-10-CM | POA: Diagnosis not present

## 2021-11-30 DIAGNOSIS — C50912 Malignant neoplasm of unspecified site of left female breast: Secondary | ICD-10-CM | POA: Diagnosis not present

## 2021-11-30 DIAGNOSIS — C784 Secondary malignant neoplasm of small intestine: Secondary | ICD-10-CM | POA: Diagnosis not present

## 2021-11-30 DIAGNOSIS — K311 Adult hypertrophic pyloric stenosis: Secondary | ICD-10-CM | POA: Diagnosis not present

## 2021-11-30 DIAGNOSIS — C50919 Malignant neoplasm of unspecified site of unspecified female breast: Secondary | ICD-10-CM

## 2021-11-30 LAB — COMPREHENSIVE METABOLIC PANEL
ALT: 72 U/L — ABNORMAL HIGH (ref 0–44)
AST: 41 U/L (ref 15–41)
Albumin: 4.2 g/dL (ref 3.5–5.0)
Alkaline Phosphatase: 104 U/L (ref 38–126)
Anion gap: 7 (ref 5–15)
BUN: 47 mg/dL — ABNORMAL HIGH (ref 8–23)
CO2: 18 mmol/L — ABNORMAL LOW (ref 22–32)
Calcium: 9.2 mg/dL (ref 8.9–10.3)
Chloride: 102 mmol/L (ref 98–111)
Creatinine, Ser: 1.44 mg/dL — ABNORMAL HIGH (ref 0.44–1.00)
GFR, Estimated: 40 mL/min — ABNORMAL LOW (ref >60)
Glucose, Bld: 154 mg/dL — ABNORMAL HIGH (ref 70–99)
Potassium: 5.1 mmol/L (ref 3.5–5.1)
Sodium: 127 mmol/L — ABNORMAL LOW (ref 135–145)
Total Bilirubin: 0.6 mg/dL (ref 0.3–1.2)
Total Protein: 7.8 g/dL (ref 6.5–8.1)

## 2021-12-01 ENCOUNTER — Inpatient Hospital Stay: Payer: Medicare HMO

## 2021-12-01 VITALS — BP 123/80 | HR 88

## 2021-12-01 DIAGNOSIS — A419 Sepsis, unspecified organism: Secondary | ICD-10-CM | POA: Diagnosis not present

## 2021-12-01 DIAGNOSIS — C50912 Malignant neoplasm of unspecified site of left female breast: Secondary | ICD-10-CM | POA: Diagnosis not present

## 2021-12-01 DIAGNOSIS — Z170 Estrogen receptor positive status [ER+]: Secondary | ICD-10-CM | POA: Diagnosis not present

## 2021-12-01 DIAGNOSIS — C784 Secondary malignant neoplasm of small intestine: Secondary | ICD-10-CM | POA: Diagnosis not present

## 2021-12-01 DIAGNOSIS — K311 Adult hypertrophic pyloric stenosis: Secondary | ICD-10-CM | POA: Diagnosis not present

## 2021-12-01 DIAGNOSIS — E785 Hyperlipidemia, unspecified: Secondary | ICD-10-CM | POA: Diagnosis not present

## 2021-12-01 DIAGNOSIS — E860 Dehydration: Secondary | ICD-10-CM

## 2021-12-01 MED ORDER — SODIUM CHLORIDE 0.9 % IV SOLN
Freq: Once | INTRAVENOUS | Status: AC
  Filled 2021-12-01: qty 250

## 2021-12-01 NOTE — Progress Notes (Signed)
Pt getting 1 L NS for elevated creatinine level/ dehydration. Encouraged patient to drink more liquids.

## 2021-12-01 NOTE — Progress Notes (Signed)
Patient on schedule for sedated Biliary tube exchange 6/26, was able to speak with daughter on phone with pre procedure instructions given. Made aware to be here at 0730, NPO after MN prior to procedure as well as driver post procedure/recovery/discharge. Stated understanding.

## 2021-12-05 ENCOUNTER — Other Ambulatory Visit: Payer: Self-pay | Admitting: Interventional Radiology

## 2021-12-05 ENCOUNTER — Encounter: Payer: Self-pay | Admitting: Radiology

## 2021-12-05 ENCOUNTER — Ambulatory Visit
Admission: RE | Admit: 2021-12-05 | Discharge: 2021-12-05 | Disposition: A | Discharge status: Home / Self Care | Payer: Medicare HMO | Source: Ambulatory Visit | Attending: Interventional Radiology | Admitting: Interventional Radiology

## 2021-12-05 DIAGNOSIS — K831 Obstruction of bile duct: Secondary | ICD-10-CM

## 2021-12-05 DIAGNOSIS — Z434 Encounter for attention to other artificial openings of digestive tract: Secondary | ICD-10-CM | POA: Diagnosis not present

## 2021-12-05 HISTORY — PX: IR EXCHANGE BILIARY DRAIN: IMG6046

## 2021-12-05 MED ORDER — FENTANYL CITRATE (PF) 100 MCG/2ML IJ SOLN
INTRAMUSCULAR | Status: AC
  Filled 2021-12-05: qty 2

## 2021-12-05 MED ORDER — SODIUM CHLORIDE 0.9 % IV SOLN: INTRAVENOUS | Status: DC

## 2021-12-05 MED ORDER — LIDOCAINE HCL 1 % IJ SOLN
INTRAMUSCULAR | Status: AC
  Administered 2021-12-05: 7 mL
  Filled 2021-12-05: qty 20

## 2021-12-05 MED ORDER — IOHEXOL 350 MG/ML SOLN
4.0000 mL | Freq: Once | INTRAVENOUS | Status: AC | PRN
  Administered 2021-12-05: 4 mL

## 2021-12-05 MED ORDER — FENTANYL CITRATE (PF) 100 MCG/2ML IJ SOLN
INTRAMUSCULAR | Status: AC | PRN
  Administered 2021-12-05: 50 ug via INTRAVENOUS

## 2021-12-05 MED ORDER — MIDAZOLAM HCL 2 MG/2ML IJ SOLN
INTRAMUSCULAR | Status: AC
  Filled 2021-12-05: qty 2

## 2021-12-05 MED ORDER — MIDAZOLAM HCL 2 MG/2ML IJ SOLN
INTRAMUSCULAR | Status: AC | PRN
  Administered 2021-12-05: 1 mg via INTRAVENOUS

## 2021-12-05 NOTE — Progress Notes (Signed)
Patient clinically stable post Biliary tube exchange with sedation, tolerated well. Received Versed 1 mg along with Fentanyl 50 mcg IV for procedure. Report given to [facility] post procedure/specials.

## 2021-12-05 NOTE — Procedures (Signed)
Interventional Radiology Procedure Note  Date of Procedure: 12/05/2021  Procedure: Biliary drain exchange   Findings:  1. Biliary drain exchange for new 10 Fr drain    Complications: No immediate complications noted.   Estimated Blood Loss: minimal  Follow-up and Recommendations: 1. Return in 8 weeks    Olive Bass, MD  Vascular & Interventional Radiology  12/05/2021 10:36 AM

## 2021-12-05 NOTE — H&P (Signed)
Chief Complaint: Patient was seen in consultation today for biliary obstruction at the request of [place] R  Referring Physician(s): [place] R  Supervising Physician: Juliet Rude  Patient Status: [place] - Out-pt  History of Present Illness: Diamond Hinton is a 66 y.o. female with PMHx significant for metastatic breast cancer with duodenal metastases and gastric outlet obstruction s/p external biliary drainage with IR and palliative GJ surgery. Patient follows with oncology and was last seen 6/12 and tolerating her Letrozole. She presents today for routine external biliary drain exchange with moderate sedation given pain during exchange. Patient states her biliary drain has been working well without any recent issues. The patient denies any current chest pain or shortness of breath. The patient denies any recent infections, fever or chills. The patient denies any history of sleep apnea or chronic oxygen use. She has no known complications to sedation or iodinated contrast.    Past Medical History:  Diagnosis Date   Anemia    Anxiety    Breast cancer ([place])    Gallstones    GERD (gastroesophageal reflux disease)    Hyperlipidemia    Hypertension    Jaundice 08/29/2021   Rash 08/29/2021   Right upper quadrant abdominal pain 08/29/2021   UTI (urinary tract infection) 08/29/2021    Past Surgical History:  Procedure Laterality Date   BILATERAL SALPINGECTOMY  04/03/1997   CHOLECYSTECTOMY N/A 07/07/2015   Procedure: LAPAROSCOPIC CHOLECYSTECTOMY ;  Surgeon: Jules Husbands, MD;  Location: [facility] ORS;  Service: General;  Laterality: N/A;   ESOPHAGOGASTRODUODENOSCOPY  08/30/2021   Procedure: ESOPHAGOGASTRODUODENOSCOPY (EGD);  Surgeon: Lucilla Lame, MD;  Location: [facility] ENDOSCOPY;  Service: Endoscopy;;   GALLBLADDER SURGERY  07/07/2015   [facility]   IR BILIARY DRAIN PLACEMENT WITH CHOLANGIOGRAM  08/31/2021   IR CONVERT BILIARY DRAIN TO INT EXT BILIARY DRAIN  09/02/2021   IR EXCHANGE  BILIARY DRAIN  10/10/2021    Allergies: Peanuts [peanut oil]  Medications: Prior to Admission medications   Medication Sig Start Date End Date Taking? Authorizing Provider  acetaminophen (TYLENOL) 325 MG tablet Take 325 mg by mouth every 6 (six) hours as needed (for pain and sometimes takes 2 if pain is worse).    [provider]  feeding supplement (ENSURE ENLIVE / ENSURE PLUS) LIQD Take 237 mLs by mouth 3 (three) times daily between meals. 09/14/21   Annita Brod, MD  letrozole [facility]) 2.5 MG tablet Take 1 tablet (2.5 mg total) by mouth daily. 09/20/21   Sindy Guadeloupe, MD  levothyroxine (SYNTHROID) 88 MCG tablet Take 1 tablet (88 mcg total) by mouth daily. 10/20/21   Virginia Crews, MD  Multiple Vitamins-Minerals (CENTRUM VITAMINTS PO) Take 1 tablet by mouth daily.    [provider]  ondansetron (ZOFRAN) 4 MG tablet Take 1 tablet (4 mg total) by mouth daily as needed for nausea or vomiting. 09/14/21 09/14/22  Annita Brod, MD  palbociclib (IBRANCE) 75 MG tablet Take 1 tablet (75 mg total) by mouth daily. Take for 21 days on, 7 days off, repeat every 28 days. 11/21/21   Sindy Guadeloupe, MD  prochlorperazine (COMPAZINE) 5 MG tablet Take 1 tablet (5 mg total) by mouth every 6 (six) hours as needed for refractory nausea / vomiting. 09/14/21   Annita Brod, MD  Simethicone (GAS-X PO) Take by mouth. PRN Patient not taking: Reported on 11/21/2021    [provider]  traZODone (DESYREL) 50 MG tablet Take 1 tablet (50 mg total) by  mouth at bedtime as needed for sleep. Patient not taking: Reported on 11/10/2021 10/07/21   Hughie Closs, PA-C     Family History  Problem Relation Age of Onset   Hypertension Mother    CVA Mother    Ulcers Mother    Emphysema Mother    Heart disease Father    Prostate cancer Father    Macular degeneration Father    Hypertension Sister    Diabetes Sister    Cervical polyp Sister    Hypertension Sister    Diabetes Sister     Lymphoma Sister     Social History   Socioeconomic History   Marital status: Married    Spouse name: Not on file   Number of children: Not on file   Years of education: Not on file   Highest education level: Not on file  Occupational History   Not on file  Tobacco Use   Smoking status: Never   Smokeless tobacco: Never  Vaping Use   Vaping Use: Never used  Substance and Sexual Activity   Alcohol use: No   Drug use: No   Sexual activity: Yes  Other Topics Concern   Not on file  Social History Narrative   Not on file   Social Determinants of Health   Financial Resource Strain: Low Risk  (11/24/2021)   Overall Financial Resource Strain (CARDIA)    Difficulty of Paying Living Expenses: Not hard at all  Food Insecurity: No Food Insecurity (11/24/2021)   Hunger Vital Sign    Worried About Running Out of Food in the Last Year: Never true    Ran Out of Food in the Last Year: Never true  Transportation Needs: No Transportation Needs (11/24/2021)   PRAPARE - Hydrologist (Medical): No    Lack of Transportation (Non-Medical): No  Physical Activity: Not on file  Stress: No Stress Concern Present (11/24/2021)   [place]    Feeling of Stress : Only a little  Social Connections: Unknown (11/24/2021)   Social Connection and Isolation Panel [NHANES]    Frequency of Communication with Friends and Family: Three times a week    Frequency of Social Gatherings with Friends and Family: Three times a week    Attends Religious Services: Patient refused    Active Member of Clubs or Organizations: Patient refused    Attends Archivist Meetings: Patient refused    Marital Status: Married   Review of Systems: A 12 point ROS discussed and pertinent positives are indicated in the HPI above.  All other systems are negative.  Review of Systems  Vital Signs: BP 124/73 (BP Location: Right  Arm)   Pulse 81   Temp 97.9 F (36.6 C) (Oral)   Resp (!) 21   SpO2 100%   Physical Exam Constitutional:      Appearance: Normal appearance.  HENT:     Head: Normocephalic and atraumatic.  Cardiovascular:     Rate and Rhythm: Normal rate and regular rhythm.  Pulmonary:     Effort: Pulmonary effort is normal. No respiratory distress.     Breath sounds: Normal breath sounds.  Abdominal:     General: There is no distension.     Palpations: Abdomen is soft.     Tenderness: There is no abdominal tenderness.     Comments: Biliary drain with clear bilious output in gravity bag  Skin:    General: Skin is  warm and dry.  Neurological:     Mental Status: She is alert and oriented to person, place, and time.     Imaging: No results found.  Labs:  CBC: Recent Labs    11/02/21 0522 11/03/21 0659 11/10/21 0946 11/21/21 1443  WBC 17.4* 6.5 9.1 9.4  HGB 13.0 11.1* 13.8 13.9  HCT 37.8 32.4* 40.3 38.9  PLT 331 241 334 288    COAGS: Recent Labs    08/31/21 0859 10/07/21 2246  INR 1.1 1.2    BMP: Recent Labs    11/03/21 0659 11/10/21 0946 11/21/21 1443 11/30/21 1528  NA 136 131* 131* 127*  K 3.7 3.5 3.4* 5.1  CL 111 103 104 102  CO2 17* 17* 17* 18*  GLUCOSE 134* 143* 149* 154*  BUN 66* 34* 46* 47*  CALCIUM 8.8* 9.8 9.8 9.2  CREATININE 1.48* 0.98 0.88 1.44*  GFRNONAA 39* >60 >60 40*    LIVER FUNCTION TESTS: Recent Labs    11/02/21 0522 11/10/21 0946 11/21/21 1443 11/30/21 1528  BILITOT 0.8 0.6 0.9 0.6  AST 29 132* 110* 41  ALT 27 150* 193* 72*  ALKPHOS 95 117 147* 104  PROT 6.8 8.6* 8.0 7.8  ALBUMIN 3.8 4.5 4.4 4.2    Assessment and Plan: This is a 66 year old female with PMHx significant for metastatic breast cancer with duodenal metastases and gastric outlet obstruction s/p external biliary drainage with IR and palliative GJ surgery. Patient follows with oncology and was last seen 6/12 and tolerating her Letrozole. She presents today for routine  external biliary drain exchange with moderate sedation given pain during exchange.   The patient has been NPO, imaging, labs and vitals have been reviewed.  Risks and benefits of image guided biliary drain exchange with moderate sedation was discussed with the patient and/or patient's family including, but not limited to bleeding, infection, damage to adjacent structures and use of iodinated contrast. All of the questions were answered and there is agreement to proceed.  Consent signed and in chart.   Thank you for this interesting consult.  I greatly enjoyed meeting ORVILLA TRUETT and look forward to participating in their care.  A copy of this report was sent to the requesting provider on this date.  Electronically Signed: Hedy Jacob, PA-C 12/05/2021, 9:02 AM   I spent a total of 5 Minutes in face to face in clinical consultation, greater than 50% of which was counseling/coordinating care for biliary obstruction.

## 2021-12-07 ENCOUNTER — Other Ambulatory Visit: Payer: Medicare HMO

## 2021-12-07 ENCOUNTER — Inpatient Hospital Stay: Payer: Medicare HMO

## 2021-12-07 ENCOUNTER — Other Ambulatory Visit: Payer: Self-pay | Admitting: *Deleted

## 2021-12-07 DIAGNOSIS — C784 Secondary malignant neoplasm of small intestine: Secondary | ICD-10-CM | POA: Diagnosis not present

## 2021-12-07 DIAGNOSIS — E785 Hyperlipidemia, unspecified: Secondary | ICD-10-CM | POA: Diagnosis not present

## 2021-12-07 DIAGNOSIS — A419 Sepsis, unspecified organism: Secondary | ICD-10-CM | POA: Diagnosis not present

## 2021-12-07 DIAGNOSIS — C50912 Malignant neoplasm of unspecified site of left female breast: Secondary | ICD-10-CM

## 2021-12-07 DIAGNOSIS — Z170 Estrogen receptor positive status [ER+]: Secondary | ICD-10-CM | POA: Diagnosis not present

## 2021-12-07 DIAGNOSIS — K311 Adult hypertrophic pyloric stenosis: Secondary | ICD-10-CM | POA: Diagnosis not present

## 2021-12-07 LAB — COMPREHENSIVE METABOLIC PANEL
ALT: 82 U/L — ABNORMAL HIGH (ref 0–44)
AST: 47 U/L — ABNORMAL HIGH (ref 15–41)
Albumin: 4 g/dL (ref 3.5–5.0)
Alkaline Phosphatase: 97 U/L (ref 38–126)
Anion gap: 5 (ref 5–15)
BUN: 35 mg/dL — ABNORMAL HIGH (ref 8–23)
CO2: 18 mmol/L — ABNORMAL LOW (ref 22–32)
Calcium: 8.4 mg/dL — ABNORMAL LOW (ref 8.9–10.3)
Chloride: 108 mmol/L (ref 98–111)
Creatinine, Ser: 1.46 mg/dL — ABNORMAL HIGH (ref 0.44–1.00)
GFR, Estimated: 39 mL/min — ABNORMAL LOW (ref >60)
Glucose, Bld: 134 mg/dL — ABNORMAL HIGH (ref 70–99)
Potassium: 4.2 mmol/L (ref 3.5–5.1)
Sodium: 131 mmol/L — ABNORMAL LOW (ref 135–145)
Total Bilirubin: 0.7 mg/dL (ref 0.3–1.2)
Total Protein: 7.3 g/dL (ref 6.5–8.1)

## 2021-12-07 LAB — CBC WITH DIFFERENTIAL/PLATELET
Abs Immature Granulocytes: 0.04 10*3/uL (ref 0.00–0.07)
Basophils Absolute: 0.1 10*3/uL (ref 0.0–0.1)
Basophils Relative: 1 %
Eosinophils Absolute: 0.1 10*3/uL (ref 0.0–0.5)
Eosinophils Relative: 1 %
HCT: 37.8 % (ref 36.0–46.0)
Hemoglobin: 12.8 g/dL (ref 12.0–15.0)
Immature Granulocytes: 1 %
Lymphocytes Relative: 34 %
Lymphs Abs: 2.5 10*3/uL (ref 0.7–4.0)
MCH: 30.9 pg (ref 26.0–34.0)
MCHC: 33.9 g/dL (ref 30.0–36.0)
MCV: 91.3 fL (ref 80.0–100.0)
Monocytes Absolute: 0.4 10*3/uL (ref 0.1–1.0)
Monocytes Relative: 5 %
Neutro Abs: 4.5 10*3/uL (ref 1.7–7.7)
Neutrophils Relative %: 58 %
Platelets: 301 10*3/uL (ref 150–400)
RBC: 4.14 MIL/uL (ref 3.87–5.11)
RDW: 14 % (ref 11.5–15.5)
WBC: 7.6 10*3/uL (ref 4.0–10.5)
nRBC: 0 % (ref 0.0–0.2)

## 2021-12-07 MED ORDER — PROCHLORPERAZINE MALEATE 5 MG PO TABS: 5.0000 mg | ORAL_TABLET | Freq: Four times a day (QID) | ORAL | 1 refills | Status: DC | PRN

## 2021-12-07 MED ORDER — ONDANSETRON HCL 4 MG PO TABS: 4.0000 mg | ORAL_TABLET | Freq: Every day | ORAL | 1 refills | Status: DC | PRN

## 2021-12-14 ENCOUNTER — Inpatient Hospital Stay: Payer: Medicare HMO | Attending: Oncology

## 2021-12-14 DIAGNOSIS — C50919 Malignant neoplasm of unspecified site of unspecified female breast: Secondary | ICD-10-CM | POA: Diagnosis not present

## 2021-12-14 DIAGNOSIS — R945 Abnormal results of liver function studies: Secondary | ICD-10-CM | POA: Diagnosis not present

## 2021-12-14 DIAGNOSIS — Z79899 Other long term (current) drug therapy: Secondary | ICD-10-CM | POA: Diagnosis not present

## 2021-12-14 DIAGNOSIS — C784 Secondary malignant neoplasm of small intestine: Secondary | ICD-10-CM | POA: Diagnosis not present

## 2021-12-14 DIAGNOSIS — N179 Acute kidney failure, unspecified: Secondary | ICD-10-CM | POA: Diagnosis not present

## 2021-12-14 DIAGNOSIS — C50912 Malignant neoplasm of unspecified site of left female breast: Secondary | ICD-10-CM

## 2021-12-14 LAB — CBC WITH DIFFERENTIAL/PLATELET
Abs Immature Granulocytes: 0.04 10*3/uL (ref 0.00–0.07)
Basophils Absolute: 0.1 10*3/uL (ref 0.0–0.1)
Basophils Relative: 1 %
Eosinophils Absolute: 0.1 10*3/uL (ref 0.0–0.5)
Eosinophils Relative: 1 %
HCT: 40.3 % (ref 36.0–46.0)
Hemoglobin: 13.7 g/dL (ref 12.0–15.0)
Immature Granulocytes: 1 %
Lymphocytes Relative: 34 %
Lymphs Abs: 2.2 10*3/uL (ref 0.7–4.0)
MCH: 31.1 pg (ref 26.0–34.0)
MCHC: 34 g/dL (ref 30.0–36.0)
MCV: 91.4 fL (ref 80.0–100.0)
Monocytes Absolute: 0.4 10*3/uL (ref 0.1–1.0)
Monocytes Relative: 6 %
Neutro Abs: 3.7 10*3/uL (ref 1.7–7.7)
Neutrophils Relative %: 57 %
Platelets: 245 10*3/uL (ref 150–400)
RBC: 4.41 MIL/uL (ref 3.87–5.11)
RDW: 14.4 % (ref 11.5–15.5)
WBC: 6.4 10*3/uL (ref 4.0–10.5)
nRBC: 0 % (ref 0.0–0.2)

## 2021-12-14 LAB — COMPREHENSIVE METABOLIC PANEL
ALT: 63 U/L — ABNORMAL HIGH (ref 0–44)
AST: 38 U/L (ref 15–41)
Albumin: 4.3 g/dL (ref 3.5–5.0)
Alkaline Phosphatase: 101 U/L (ref 38–126)
Anion gap: 10 (ref 5–15)
BUN: 40 mg/dL — ABNORMAL HIGH (ref 8–23)
CO2: 16 mmol/L — ABNORMAL LOW (ref 22–32)
Calcium: 9.5 mg/dL (ref 8.9–10.3)
Chloride: 105 mmol/L (ref 98–111)
Creatinine, Ser: 1.66 mg/dL — ABNORMAL HIGH (ref 0.44–1.00)
GFR, Estimated: 34 mL/min — ABNORMAL LOW (ref >60)
Glucose, Bld: 143 mg/dL — ABNORMAL HIGH (ref 70–99)
Potassium: 4.4 mmol/L (ref 3.5–5.1)
Sodium: 131 mmol/L — ABNORMAL LOW (ref 135–145)
Total Bilirubin: 0.5 mg/dL (ref 0.3–1.2)
Total Protein: 7.8 g/dL (ref 6.5–8.1)

## 2021-12-21 ENCOUNTER — Encounter: Payer: Self-pay | Admitting: Oncology

## 2021-12-21 ENCOUNTER — Inpatient Hospital Stay: Payer: Medicare HMO | Admitting: Oncology

## 2021-12-21 ENCOUNTER — Inpatient Hospital Stay: Payer: Medicare HMO

## 2021-12-21 ENCOUNTER — Ambulatory Visit: Payer: Medicare HMO | Admitting: Pharmacist

## 2021-12-21 VITALS — BP 113/76 | HR 80 | Temp 98.1°F | Resp 16 | Wt 120.1 lb

## 2021-12-21 DIAGNOSIS — N179 Acute kidney failure, unspecified: Secondary | ICD-10-CM | POA: Diagnosis not present

## 2021-12-21 DIAGNOSIS — C784 Secondary malignant neoplasm of small intestine: Secondary | ICD-10-CM | POA: Diagnosis not present

## 2021-12-21 DIAGNOSIS — C50919 Malignant neoplasm of unspecified site of unspecified female breast: Secondary | ICD-10-CM

## 2021-12-21 DIAGNOSIS — Z79899 Other long term (current) drug therapy: Secondary | ICD-10-CM

## 2021-12-21 DIAGNOSIS — C50912 Malignant neoplasm of unspecified site of left female breast: Secondary | ICD-10-CM

## 2021-12-21 DIAGNOSIS — R7989 Other specified abnormal findings of blood chemistry: Secondary | ICD-10-CM | POA: Diagnosis not present

## 2021-12-21 DIAGNOSIS — R945 Abnormal results of liver function studies: Secondary | ICD-10-CM | POA: Diagnosis not present

## 2021-12-21 LAB — COMPREHENSIVE METABOLIC PANEL
ALT: 54 U/L — ABNORMAL HIGH (ref 0–44)
AST: 31 U/L (ref 15–41)
Albumin: 3.8 g/dL (ref 3.5–5.0)
Alkaline Phosphatase: 84 U/L (ref 38–126)
Anion gap: 8 (ref 5–15)
BUN: 40 mg/dL — ABNORMAL HIGH (ref 8–23)
CO2: 16 mmol/L — ABNORMAL LOW (ref 22–32)
Calcium: 8.7 mg/dL — ABNORMAL LOW (ref 8.9–10.3)
Chloride: 108 mmol/L (ref 98–111)
Creatinine, Ser: 1.42 mg/dL — ABNORMAL HIGH (ref 0.44–1.00)
GFR, Estimated: 41 mL/min — ABNORMAL LOW (ref >60)
Glucose, Bld: 150 mg/dL — ABNORMAL HIGH (ref 70–99)
Potassium: 4 mmol/L (ref 3.5–5.1)
Sodium: 132 mmol/L — ABNORMAL LOW (ref 135–145)
Total Bilirubin: 0.7 mg/dL (ref 0.3–1.2)
Total Protein: 6.6 g/dL (ref 6.5–8.1)

## 2021-12-21 LAB — CBC WITH DIFFERENTIAL/PLATELET
Abs Immature Granulocytes: 0.04 10*3/uL (ref 0.00–0.07)
Basophils Absolute: 0 10*3/uL (ref 0.0–0.1)
Basophils Relative: 1 %
Eosinophils Absolute: 0 10*3/uL (ref 0.0–0.5)
Eosinophils Relative: 1 %
HCT: 35.1 % — ABNORMAL LOW (ref 36.0–46.0)
Hemoglobin: 11.6 g/dL — ABNORMAL LOW (ref 12.0–15.0)
Immature Granulocytes: 1 %
Lymphocytes Relative: 28 %
Lymphs Abs: 1.7 10*3/uL (ref 0.7–4.0)
MCH: 30.1 pg (ref 26.0–34.0)
MCHC: 33 g/dL (ref 30.0–36.0)
MCV: 91.2 fL (ref 80.0–100.0)
Monocytes Absolute: 0.5 10*3/uL (ref 0.1–1.0)
Monocytes Relative: 8 %
Neutro Abs: 3.6 10*3/uL (ref 1.7–7.7)
Neutrophils Relative %: 61 %
Platelets: 182 10*3/uL (ref 150–400)
RBC: 3.85 MIL/uL — ABNORMAL LOW (ref 3.87–5.11)
RDW: 14.9 % (ref 11.5–15.5)
WBC: 5.9 10*3/uL (ref 4.0–10.5)
nRBC: 0 % (ref 0.0–0.2)

## 2021-12-21 NOTE — Progress Notes (Unsigned)
Pt states that she has stopped PROTONIX based on ER recommendations on 11/03/2021 has been dealing with constant nausea since ER visit. States onedesatron and compazine does not help at all.  Started taking it once again on Sundat night ad has felt so much better since; will like to discuss if she is able to continue her PROTONIX or is there an alternative medicatio nshe can have.

## 2021-12-22 ENCOUNTER — Encounter: Payer: Self-pay | Admitting: Oncology

## 2021-12-22 NOTE — Progress Notes (Signed)
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  She was found to have diffuse duodenal wall thickening as well as a palpable mass in the left breast.  Duodenal biopsy as well as left breast biopsy was consistent withER positive greater than 90%, PR weakly +1 to 10% HER2 negative lobular breast cancer.  Patient underwent external biliary drain for the obstructive jaundice and palliative gastrojejunostomy surgery to relieve her gastric outlet obstruction.CT scan otherwise did not show any evidence of distant metastatic disease.   Patient was started on letrozole plus [place] in early April 2023.  However patient developed AKI and hyperuricemia requiring hospitalizations.  Gayla Medicus was therefore stopped and patient was started on Ibrance 75 mg in May 2023.    Interval history-Protonix was stopped after patient is discharged from the hospital in May 2023.  Patient states that the only medication was really helps with her nausea more than Zofran or Compazine and therefore she started taking her Protonix about a week ago and nausea has been under better control especially in the mornings.  Oral intake has improved after better control of nausea.  ECOG PS- 1 Pain scale-0  Review of systems- Review of Systems  Constitutional:  Positive for  malaise/fatigue. Negative for chills, fever and weight loss.  HENT:  Negative for congestion, ear discharge and nosebleeds.   Eyes:  Negative for blurred vision.  Respiratory:  Negative for cough, hemoptysis, sputum production, shortness of breath and wheezing.   Cardiovascular:  Negative for chest pain, palpitations, orthopnea and claudication.  Gastrointestinal:  Positive for nausea. Negative for abdominal pain, blood in stool, constipation, diarrhea, heartburn, melena and vomiting.  Genitourinary:  Negative for dysuria, flank pain, frequency, hematuria and urgency.  Musculoskeletal:  Negative for back pain, joint pain and myalgias.  Skin:  Negative for rash.  Neurological:  Negative for dizziness, tingling, focal weakness, seizures, weakness and headaches.  Endo/Heme/Allergies:  Does not bruise/bleed easily.  Psychiatric/Behavioral:  Negative for depression and suicidal ideas. The patient does not have insomnia.       Allergies  Allergen Reactions   Peanuts [Peanut Oil] Shortness Of Breath and Itching     Past Medical History:  Diagnosis Date   Anemia    Anxiety    Breast cancer ([place])    Gallstones    GERD (gastroesophageal reflux disease)    Hyperlipidemia    Hypertension    Jaundice 08/29/2021   Rash 08/29/2021   Right upper quadrant abdominal pain 08/29/2021   UTI (urinary tract infection) 08/29/2021     Past Surgical History:  Procedure Laterality Date   BILATERAL SALPINGECTOMY  04/03/1997   CHOLECYSTECTOMY N/A 07/07/2015   Procedure: LAPAROSCOPIC CHOLECYSTECTOMY ;  Surgeon: Jules Husbands, MD;  Location: [facility] ORS;  Service: General;  Laterality: N/A;   ESOPHAGOGASTRODUODENOSCOPY  08/30/2021   Procedure: ESOPHAGOGASTRODUODENOSCOPY (EGD);  Surgeon: Lucilla Lame, MD;  Location[facility] ENDOSCOPY;  Service: Endoscopy;;  GALLBLADDER SURGERY  07/07/2015   [facility]   IR BILIARY DRAIN PLACEMENT WITH CHOLANGIOGRAM  08/31/2021   IR CONVERT BILIARY DRAIN TO INT EXT BILIARY DRAIN   09/02/2021   IR EXCHANGE BILIARY DRAIN  10/10/2021   IR EXCHANGE BILIARY DRAIN  12/05/2021    Social History   Socioeconomic History   Marital status: Married    Spouse name: Not on file   Number of children: Not on file   Years of education: Not on file   Highest education level: Not on file  Occupational History   Not on file  Tobacco Use   Smoking status: Never   Smokeless tobacco: Never  Vaping Use   Vaping Use: Never used  Substance and Sexual Activity   Alcohol use: No   Drug use: No   Sexual activity: Yes  Other Topics Concern   Not on file  Social History Narrative   Not on file   Social Determinants of Health   Financial Resource Strain: Low Risk  (11/24/2021)   Overall Financial Resource Strain (CARDIA)    Difficulty of Paying Living Expenses: Not hard at all  Food Insecurity: No Food Insecurity (11/24/2021)   Hunger Vital Sign    Worried About Running Out of Food in the Last Year: Never true    [place] in the Last Year: Never true  Transportation Needs: No Transportation Needs (11/24/2021)   PRAPARE - Hydrologist (Medical): No    Lack of Transportation (Non-Medical): No  Physical Activity: Not on file  Stress: No Stress Concern Present (11/24/2021)   [place]    Feeling of Stress : Only a little  Social Connections: Unknown (11/24/2021)   Social Connection and Isolation Panel [NHANES]    Frequency of Communication with Friends and Family: Three times a week    Frequency of Social Gatherings with Friends and Family: Three times a week    Attends Religious Services: Patient refused    Active Member of Clubs or Organizations: Patient refused    Attends Archivist Meetings: Patient refused    Marital Status: Married  Human resources officer Violence: Not on file    Family History  Problem Relation Age of Onset   Hypertension Mother    CVA Mother     Ulcers Mother    Emphysema Mother    Heart disease Father    Prostate cancer Father    Macular degeneration Father    Hypertension Sister    Diabetes Sister    Cervical polyp Sister    Hypertension Sister    Diabetes Sister    Lymphoma Sister      Current Outpatient Medications:    acetaminophen (TYLENOL) 325 MG tablet, Take 325 mg by mouth every 6 (six) hours as needed (for pain and sometimes takes 2 if pain is worse)., Disp: , Rfl:    feeding supplement (ENSURE ENLIVE / ENSURE PLUS) LIQD, Take 237 mLs by mouth 3 (three) times daily between meals., Disp: 237 mL, Rfl: 12   letrozole (FEMARA) 2.5 MG tablet, Take 1 tablet (2.5 mg total) by mouth daily., Disp: 30 tablet, Rfl: 3   levothyroxine (SYNTHROID) 88 MCG tablet, Take 1 tablet (88 mcg total) by mouth daily., Disp: 90 tablet, Rfl: 1   Multiple Vitamins-Minerals (CENTRUM VITAMINTS PO), Take 1 tablet by mouth daily., Disp: , Rfl:    ondansetron (ZOFRAN) 4 MG tablet, Take 1 tablet (4 mg total)  by mouth daily as needed for nausea or vomiting., Disp: 30 tablet, Rfl: 1   palbociclib (IBRANCE) 75 MG tablet, Take 1 tablet (75 mg total) by mouth daily. Take for 21 days on, 7 days off, repeat every 28 days., Disp: 21 tablet, Rfl: 0   prochlorperazine (COMPAZINE) 5 MG tablet, Take 1 tablet (5 mg total) by mouth every 6 (six) hours as needed for refractory nausea / vomiting., Disp: 30 tablet, Rfl: 1   Simethicone (GAS-X PO), Take by mouth. PRN (Patient not taking: Reported on 11/21/2021), Disp: , Rfl:    traZODone (DESYREL) 50 MG tablet, Take 1 tablet (50 mg total) by mouth at bedtime as needed for sleep. (Patient not taking: Reported on 11/10/2021), Disp: 30 tablet, Rfl: 0  Physical exam:  Vitals:   12/21/21 1501  BP: 113/76  Pulse: 80  Resp: 16  Temp: 98.1 F (36.7 C)  SpO2: 100%  Weight: 120 lb 1.6 oz (54.5 kg)   Physical Exam Constitutional:      General: She is not in acute distress. Cardiovascular:     Rate and Rhythm: Normal rate  and regular rhythm.     Heart sounds: Normal heart sounds.  Pulmonary:     Effort: Pulmonary effort is normal.  Abdominal:     General: Bowel sounds are normal.     Palpations: Abdomen is soft.     Comments: Ptca in place  Skin:    General: Skin is warm and dry.  Neurological:     Mental Status: She is alert and oriented to person, place, and time.         Latest Ref Rng & Units 12/21/2021    2:40 PM  CMP  Glucose 70 - 99 mg/dL 150   BUN 8 - 23 mg/dL 40   Creatinine 0.44 - 1.00 mg/dL 1.42   Sodium 135 - 145 mmol/L 132   Potassium 3.5 - 5.1 mmol/L 4.0   Chloride 98 - 111 mmol/L 108   CO2 22 - 32 mmol/L 16   Calcium 8.9 - 10.3 mg/dL 8.7   Total Protein 6.5 - 8.1 g/dL 6.6   Total Bilirubin 0.3 - 1.2 mg/dL 0.7   Alkaline Phos 38 - 126 U/L 84   AST 15 - 41 U/L 31   ALT 0 - 44 U/L 54       Latest Ref Rng & Units 12/21/2021    2:40 PM  CBC  WBC 4.0 - 10.5 K/uL 5.9   Hemoglobin 12.0 - 15.0 g/dL 11.6   Hematocrit 36.0 - 46.0 % 35.1   Platelets 150 - 400 K/uL 182     No images are attached to the encounter.  IR EXCHANGE BILIARY DRAIN  Result Date: 12/05/2021 INDICATION: Internal external biliary drain, routine exchange EXAM: Exchange of percutaneous biliary drainage catheter using fluoroscopic guidance MEDICATIONS: Per EMR ANESTHESIA/SEDATION: Moderate (conscious) sedation was employed during this procedure. A total of Versed 1 mg and Fentanyl 50 mcg was administered intravenously by the radiology nurse. Total intra-service moderate Sedation Time: 10 minutes. The patient's level of consciousness and vital signs were monitored continuously by radiology nursing throughout the procedure under my direct supervision. FLUOROSCOPY: Radiation Exposure Index (as provided by the fluoroscopic device): 2.1 minutes (10 mGy) COMPLICATIONS: None immediate. PROCEDURE: Informed written consent was obtained from the patient after a thorough discussion of the procedural risks, benefits and  alternatives. All questions were addressed. Maximal Sterile Barrier Technique was utilized including caps, mask, sterile gowns, sterile gloves, sterile drape, hand  hygiene and skin antiseptic. A timeout was performed prior to the initiation of the procedure. The patient was placed supine on the exam table. The mid abdomen was prepped and draped in the standard sterile fashion with inclusion of the existing biliary drainage catheter within the sterile field. Injection of the existing biliary drainage catheter demonstrated appropriate location and filling of the central biliary tree. The locking loop was released, and over an Amplatz wire, the existing drainage catheter was removed. A new, similar 10.2 [place] biliary drainage catheter was then advanced with the locking loop formed within the small bowel. Injection via the new drainage catheter demonstrated appropriate location and again filling of the central biliary tree. The drainage catheter was attached to bag drainage, and secured to the skin using silk suture and a dressing. The patient tolerated the procedure well without immediate complication. IMPRESSION: Successful exchange of percutaneous internal-external biliary drainage catheter for a new, similar 10 [place] biliary drainage catheter. Drain to stay to bag drainage. Patient to return to Interventional Radiology in approximately 8 weeks for routine exchange. Electronically Signed   By: Albin Felling M.D.   On: 12/05/2021 10:46     Assessment and plan- Patient is a 66 y.o. female with metastatic ER positive lobular breast cancer with the sole site of metastases being duodenal obstruction s/p palliative gastrojejunostomy.  She had evidence of obstructive jaundice s/p external biliary drain placement.  She is currently on Ibrance and letrozole and this is a routine follow-up visit  Patient is tolerating Ibrance 75 mg 3 weeks on and 1 week off well and will be starting her next cycle tomorrow.  She will  also continue to take letrozole until progression or toxicity.  Patient had evidence of AKI likely secondary to nausea and poor oral intake which is somewhat better today.  Now that she is on Ibrance tablet it should not interfere with Protonix which she can continue to take.  I am holding off on fluids today and I will repeat her labs in 2 4 and 6 weeks.  She will see covering NP in 4 weeks and I will see her in 6 weeks with scans prior.  Abnormal LFTs: Improved after she had a recent biliary drain exchange.  I will discuss bone density scan at my next visit   Visit Diagnosis 1. Primary malignant neoplasm of breast with metastasis ([place])   2. High risk medication use   3. AKI (acute kidney injury) ([place])   4. Abnormal LFTs      Dr. Randa Evens, MD, MPH [facility] [phone] 12/22/2021 9:40 AM

## 2021-12-28 ENCOUNTER — Telehealth: Payer: Self-pay | Admitting: *Deleted

## 2021-12-28 NOTE — Telephone Encounter (Signed)
Ok to take otc anti histamine

## 2021-12-28 NOTE — Telephone Encounter (Signed)
Patient informed that she may take over the counter antihistamine

## 2021-12-28 NOTE — Telephone Encounter (Signed)
Patient called reporting that she has had sinus issue for a few days  and has post nasal drip causing her to have a sore throat. She is not fevered, there is no redness or white patches in her throat. She is on [place]. She is asking if she can take an over the counter antihistamine Please advise

## 2021-12-29 ENCOUNTER — Inpatient Hospital Stay: Payer: Medicare HMO

## 2021-12-29 NOTE — Progress Notes (Signed)
Nutrition Follow-up:  Patient with metastatic lobular left breast cancer with duodenal wall thickening presenting with gastric outlet obstruction.  S/p robotic assisted lap gastrojejunostomy on 3/30.  Patient taking ibrance and letrozole.    Spoke with patient via phone.  Reports that appetite is better and nausea is less.  Started taking protonix again and nausea has improved.  Has been eating chicken, beef stew, meatloaf, fruits, vegetables.  Able to tolerate small salad at times.  Can't tolerate anything fried or too sweet.  Drinking less oral nutrition supplements because eating more orally.  Eating small frequent meals.  Having regular bowel movement 1-2 per day.      Medications: MVI  Labs: reviewed  Anthropometrics:   Weight 120 lb 1.6 oz on 7/12  119 lb on 6/12  125 lb on 5/11 129 lb on 4/11 142 lb on 04/18/21    NUTRITION DIAGNOSIS: Inadequate oral intake improved   INTERVENTION:  Continue small frequent meals/snacks including protein Contact information given and patient will contact RD if needed    NEXT VISIT: no follow-up RD available if needed  Diamond Hinton, [place], [place] Registered Dietitian [phone]

## 2022-01-02 ENCOUNTER — Other Ambulatory Visit: Payer: Self-pay

## 2022-01-04 ENCOUNTER — Inpatient Hospital Stay: Payer: Medicare HMO

## 2022-01-04 DIAGNOSIS — C784 Secondary malignant neoplasm of small intestine: Secondary | ICD-10-CM | POA: Diagnosis not present

## 2022-01-04 DIAGNOSIS — Z79899 Other long term (current) drug therapy: Secondary | ICD-10-CM | POA: Diagnosis not present

## 2022-01-04 DIAGNOSIS — C50919 Malignant neoplasm of unspecified site of unspecified female breast: Secondary | ICD-10-CM | POA: Diagnosis not present

## 2022-01-04 DIAGNOSIS — R945 Abnormal results of liver function studies: Secondary | ICD-10-CM | POA: Diagnosis not present

## 2022-01-04 DIAGNOSIS — N179 Acute kidney failure, unspecified: Secondary | ICD-10-CM | POA: Diagnosis not present

## 2022-01-04 LAB — CBC WITH DIFFERENTIAL/PLATELET
Abs Immature Granulocytes: 0.03 10*3/uL (ref 0.00–0.07)
Basophils Absolute: 0.1 10*3/uL (ref 0.0–0.1)
Basophils Relative: 1 %
Eosinophils Absolute: 0 10*3/uL (ref 0.0–0.5)
Eosinophils Relative: 1 %
HCT: 34.9 % — ABNORMAL LOW (ref 36.0–46.0)
Hemoglobin: 11.4 g/dL — ABNORMAL LOW (ref 12.0–15.0)
Immature Granulocytes: 0 %
Lymphocytes Relative: 25 %
Lymphs Abs: 1.9 10*3/uL (ref 0.7–4.0)
MCH: 31.1 pg (ref 26.0–34.0)
MCHC: 32.7 g/dL (ref 30.0–36.0)
MCV: 95.4 fL (ref 80.0–100.0)
Monocytes Absolute: 0.4 10*3/uL (ref 0.1–1.0)
Monocytes Relative: 6 %
Neutro Abs: 5.1 10*3/uL (ref 1.7–7.7)
Neutrophils Relative %: 67 %
Platelets: 311 10*3/uL (ref 150–400)
RBC: 3.66 MIL/uL — ABNORMAL LOW (ref 3.87–5.11)
RDW: 15.1 % (ref 11.5–15.5)
WBC: 7.6 10*3/uL (ref 4.0–10.5)
nRBC: 0 % (ref 0.0–0.2)

## 2022-01-04 LAB — COMPREHENSIVE METABOLIC PANEL
ALT: 58 U/L — ABNORMAL HIGH (ref 0–44)
AST: 43 U/L — ABNORMAL HIGH (ref 15–41)
Albumin: 3.7 g/dL (ref 3.5–5.0)
Alkaline Phosphatase: 90 U/L (ref 38–126)
Anion gap: 5 (ref 5–15)
BUN: 17 mg/dL (ref 8–23)
CO2: 21 mmol/L — ABNORMAL LOW (ref 22–32)
Calcium: 8.7 mg/dL — ABNORMAL LOW (ref 8.9–10.3)
Chloride: 111 mmol/L (ref 98–111)
Creatinine, Ser: 1.13 mg/dL — ABNORMAL HIGH (ref 0.44–1.00)
GFR, Estimated: 53 mL/min — ABNORMAL LOW (ref >60)
Glucose, Bld: 161 mg/dL — ABNORMAL HIGH (ref 70–99)
Potassium: 3.8 mmol/L (ref 3.5–5.1)
Sodium: 137 mmol/L (ref 135–145)
Total Bilirubin: 0.4 mg/dL (ref 0.3–1.2)
Total Protein: 6.8 g/dL (ref 6.5–8.1)

## 2022-01-05 LAB — CANCER ANTIGEN 27.29: CA 27.29: 20.2 U/mL (ref 0.0–38.6)

## 2022-01-05 LAB — CANCER ANTIGEN 15-3: CA 15-3: 24.9 U/mL (ref 0.0–25.0)

## 2022-01-18 ENCOUNTER — Inpatient Hospital Stay (HOSPITAL_BASED_OUTPATIENT_CLINIC_OR_DEPARTMENT_OTHER): Payer: Medicare HMO | Admitting: Medical Oncology

## 2022-01-18 ENCOUNTER — Inpatient Hospital Stay: Payer: Medicare HMO | Attending: Oncology

## 2022-01-18 VITALS — BP 139/78 | HR 70 | Temp 98.0°F | Resp 18 | Ht 63.0 in | Wt 120.0 lb

## 2022-01-18 DIAGNOSIS — R7989 Other specified abnormal findings of blood chemistry: Secondary | ICD-10-CM | POA: Diagnosis not present

## 2022-01-18 DIAGNOSIS — Z79811 Long term (current) use of aromatase inhibitors: Secondary | ICD-10-CM | POA: Insufficient documentation

## 2022-01-18 DIAGNOSIS — C784 Secondary malignant neoplasm of small intestine: Secondary | ICD-10-CM | POA: Insufficient documentation

## 2022-01-18 DIAGNOSIS — Z79899 Other long term (current) drug therapy: Secondary | ICD-10-CM

## 2022-01-18 DIAGNOSIS — R945 Abnormal results of liver function studies: Secondary | ICD-10-CM | POA: Insufficient documentation

## 2022-01-18 DIAGNOSIS — N179 Acute kidney failure, unspecified: Secondary | ICD-10-CM | POA: Diagnosis not present

## 2022-01-18 DIAGNOSIS — C50919 Malignant neoplasm of unspecified site of unspecified female breast: Secondary | ICD-10-CM

## 2022-01-18 LAB — COMPREHENSIVE METABOLIC PANEL
ALT: 79 U/L — ABNORMAL HIGH (ref 0–44)
AST: 58 U/L — ABNORMAL HIGH (ref 15–41)
Albumin: 4.2 g/dL (ref 3.5–5.0)
Alkaline Phosphatase: 82 U/L (ref 38–126)
Anion gap: 10 (ref 5–15)
BUN: 21 mg/dL (ref 8–23)
CO2: 23 mmol/L (ref 22–32)
Calcium: 9.4 mg/dL (ref 8.9–10.3)
Chloride: 106 mmol/L (ref 98–111)
Creatinine, Ser: 1.26 mg/dL — ABNORMAL HIGH (ref 0.44–1.00)
GFR, Estimated: 47 mL/min — ABNORMAL LOW (ref >60)
Glucose, Bld: 132 mg/dL — ABNORMAL HIGH (ref 70–99)
Potassium: 3.8 mmol/L (ref 3.5–5.1)
Sodium: 139 mmol/L (ref 135–145)
Total Bilirubin: 0.5 mg/dL (ref 0.3–1.2)
Total Protein: 7.1 g/dL (ref 6.5–8.1)

## 2022-01-18 LAB — CBC WITH DIFFERENTIAL/PLATELET
Abs Immature Granulocytes: 0.02 K/uL (ref 0.00–0.07)
Basophils Absolute: 0.1 K/uL (ref 0.0–0.1)
Basophils Relative: 1 %
Eosinophils Absolute: 0.1 K/uL (ref 0.0–0.5)
Eosinophils Relative: 1 %
HCT: 39.2 % (ref 36.0–46.0)
Hemoglobin: 13 g/dL (ref 12.0–15.0)
Immature Granulocytes: 0 %
Lymphocytes Relative: 20 %
Lymphs Abs: 1.7 K/uL (ref 0.7–4.0)
MCH: 31 pg (ref 26.0–34.0)
MCHC: 33.2 g/dL (ref 30.0–36.0)
MCV: 93.6 fL (ref 80.0–100.0)
Monocytes Absolute: 0.4 K/uL (ref 0.1–1.0)
Monocytes Relative: 5 %
Neutro Abs: 6.3 K/uL (ref 1.7–7.7)
Neutrophils Relative %: 73 %
Platelets: 254 K/uL (ref 150–400)
RBC: 4.19 MIL/uL (ref 3.87–5.11)
RDW: 14.5 % (ref 11.5–15.5)
WBC: 8.6 K/uL (ref 4.0–10.5)
nRBC: 0 % (ref 0.0–0.2)

## 2022-01-18 NOTE — Progress Notes (Signed)
Patient reports some mild nausea which the compazine helps and 75% normal appetite

## 2022-01-18 NOTE — Progress Notes (Signed)
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  She was found to have diffuse duodenal wall thickening as well as a palpable mass in the left breast.  Duodenal biopsy as well as left breast biopsy was consistent withER positive greater than 90%, PR weakly +1 to 10% HER2 negative lobular breast cancer.  Patient underwent external biliary drain for the obstructive jaundice and palliative gastrojejunostomy surgery to relieve her gastric outlet obstruction.CT scan otherwise did not show any evidence of distant metastatic disease.   Patient was started on letrozole plus Kisqali in early April 2023.  However patient developed AKI and hyperuricemia requiring hospitalizations.  Gayla Medicus was therefore stopped and patient was started on Ibrance 75 mg in May 2023.    Interval history- Patient reports that she is doing well. She is feeling well on her new treatment. Less fatigue, nausea. Appetite is up. Working on Designer, fashion/clothing intake. No complaints at this time.   ECOG PS- 1 Pain scale-0  Review of systems- Review of Systems  Constitutional:  Negative for chills, fever, malaise/fatigue and weight loss.  HENT:  Negative for congestion, ear discharge and nosebleeds.   Eyes:  Negative for blurred vision.  Respiratory:  Negative for cough, hemoptysis,  sputum production, shortness of breath and wheezing.   Cardiovascular:  Negative for chest pain, palpitations, orthopnea and claudication.  Gastrointestinal:  Negative for abdominal pain, blood in stool, constipation, diarrhea, heartburn, melena, nausea and vomiting.  Genitourinary:  Negative for dysuria, flank pain, frequency, hematuria and urgency.  Musculoskeletal:  Negative for back pain, joint pain and myalgias.  Skin:  Negative for rash.  Neurological:  Negative for dizziness, tingling, focal weakness, seizures, weakness and headaches.  Endo/Heme/Allergies:  Does not bruise/bleed easily.  Psychiatric/Behavioral:  Negative for depression and suicidal ideas. The patient does not have insomnia.       Allergies  Allergen Reactions   Peanuts [Peanut Oil] Shortness Of Breath and Itching     Past Medical History:  Diagnosis Date   Anemia    Anxiety    Breast cancer ([place])    Gallstones    GERD (gastroesophageal reflux disease)    Hyperlipidemia    Hypertension    Jaundice 08/29/2021   Rash 08/29/2021   Right upper quadrant abdominal pain 08/29/2021   UTI (urinary tract infection) 08/29/2021     Past Surgical History:  Procedure Laterality Date   BILATERAL SALPINGECTOMY  04/03/1997   CHOLECYSTECTOMY N/A 07/07/2015   Procedure: LAPAROSCOPIC CHOLECYSTECTOMY ;  Surgeon: Jules Husbands, MD;  Location: [facility] ORS;  Service: General;  Laterality: N/A;   ESOPHAGOGASTRODUODENOSCOPY  08/30/2021   Procedure: ESOPHAGOGASTRODUODENOSCOPY (EGD);  Surgeon: Lucilla Lame, MD;  Location: [facility] ENDOSCOPY;  Service: Endoscopy;;   GALLBLADDER SURGERY  07/07/2015   [facility]   IR BILIARY DRAIN PLACEMENT WITH CHOLANGIOGRAM  08/31/2021   IR CONVERT BILIARY DRAIN TO INT EXT BILIARY DRAIN  09/02/2021   IR  EXCHANGE BILIARY DRAIN  10/10/2021   IR EXCHANGE BILIARY DRAIN  12/05/2021    Social History   Socioeconomic History   Marital status: Married    Spouse name: Not on file   Number of children: Not on file    Years of education: Not on file   Highest education level: Not on file  Occupational History   Not on file  Tobacco Use   Smoking status: Never   Smokeless tobacco: Never  Vaping Use   Vaping Use: Never used  Substance and Sexual Activity   Alcohol use: No   Drug use: No   Sexual activity: Yes  Other Topics Concern   Not on file  Social History Narrative   Not on file   Social Determinants of Health   Financial Resource Strain: Low Risk  (11/24/2021)   Overall Financial Resource Strain (CARDIA)    Difficulty of Paying Living Expenses: Not hard at all  Food Insecurity: No Food Insecurity (11/24/2021)   Hunger Vital Sign    Worried About Running Out of Food in the Last Year: Never true    [place] in the Last Year: Never true  Transportation Needs: No Transportation Needs (11/24/2021)   PRAPARE - Hydrologist (Medical): No    Lack of Transportation (Non-Medical): No  Physical Activity: Not on file  Stress: No Stress Concern Present (11/24/2021)   [place]    Feeling of Stress : Only a little  Social Connections: Unknown (11/24/2021)   Social Connection and Isolation Panel [NHANES]    Frequency of Communication with Friends and Family: Three times a week    Frequency of Social Gatherings with Friends and Family: Three times a week    Attends Religious Services: Patient refused    Active Member of Clubs or Organizations: Patient refused    Attends Archivist Meetings: Patient refused    Marital Status: Married  Human resources officer Violence: Not on file    Family History  Problem Relation Age of Onset   Hypertension Mother    CVA Mother    Ulcers Mother    Emphysema Mother    Heart disease Father    Prostate cancer Father    Macular degeneration Father    Hypertension Sister    Diabetes Sister    Cervical polyp Sister    Hypertension Sister    Diabetes Sister     Lymphoma Sister      Current Outpatient Medications:    acetaminophen (TYLENOL) 325 MG tablet, Take 325 mg by mouth every 6 (six) hours as needed (for pain and sometimes takes 2 if pain is worse)., Disp: , Rfl:    feeding supplement (ENSURE ENLIVE / ENSURE PLUS) LIQD, Take 237 mLs by mouth 3 (three) times daily between meals., Disp: 237 mL, Rfl: 12   letrozole (FEMARA) 2.5 MG tablet, Take 1 tablet (2.5 mg total) by mouth daily., Disp: 30 tablet, Rfl: 3   levothyroxine (SYNTHROID) 88 MCG tablet, Take 1 tablet (88 mcg total) by mouth daily., Disp: 90 tablet, Rfl: 1   Multiple Vitamins-Minerals (CENTRUM VITAMINTS PO), Take 1 tablet by mouth daily., Disp: , Rfl:    ondansetron (ZOFRAN) 4 MG tablet, Take 1 tablet (4 mg total) by mouth daily as needed for nausea or vomiting., Disp: 30 tablet, Rfl: 1   palbociclib (IBRANCE) 75 MG tablet, Take 1 tablet (75 mg total) by mouth daily. Take for 21  days on, 7 days off, repeat every 28 days., Disp: 21 tablet, Rfl: 0   pantoprazole (PROTONIX) 40 MG tablet, Take 40 mg by mouth daily., Disp: , Rfl:    prochlorperazine (COMPAZINE) 5 MG tablet, Take 1 tablet (5 mg total) by mouth every 6 (six) hours as needed for refractory nausea / vomiting., Disp: 30 tablet, Rfl: 1   Simethicone (GAS-X PO), Take by mouth. PRN (Patient not taking: Reported on 01/18/2022), Disp: , Rfl:    traZODone (DESYREL) 50 MG tablet, Take 1 tablet (50 mg total) by mouth at bedtime as needed for sleep. (Patient not taking: Reported on 11/10/2021), Disp: 30 tablet, Rfl: 0  Physical exam:  Vitals:   01/18/22 1420  BP: 139/78  Pulse: 70  Resp: 18  Temp: 98 F (36.7 C)  TempSrc: Tympanic  SpO2: 99%  Weight: 120 lb (54.4 kg)  Height: _0  (1.6 m)   Physical Exam Constitutional:      General: She is not in acute distress. Cardiovascular:     Rate and Rhythm: Normal rate and regular rhythm.     Heart sounds: Normal heart sounds.  Pulmonary:     Effort: Pulmonary effort is normal.   Abdominal:     General: Bowel sounds are normal.     Palpations: Abdomen is soft.     Comments: Ptca in place  Skin:    General: Skin is warm and dry.  Neurological:     Mental Status: She is alert and oriented to person, place, and time.         Latest Ref Rng & Units 01/18/2022    1:54 PM  CMP  Glucose 70 - 99 mg/dL 132   BUN 8 - 23 mg/dL 21   Creatinine 0.44 - 1.00 mg/dL 1.26   Sodium 135 - 145 mmol/L 139   Potassium 3.5 - 5.1 mmol/L 3.8   Chloride 98 - 111 mmol/L 106   CO2 22 - 32 mmol/L 23   Calcium 8.9 - 10.3 mg/dL 9.4   Total Protein 6.5 - 8.1 g/dL 7.1   Total Bilirubin 0.3 - 1.2 mg/dL 0.5   Alkaline Phos 38 - 126 U/L 82   AST 15 - 41 U/L 58   ALT 0 - 44 U/L 79       Latest Ref Rng & Units 01/18/2022    1:54 PM  CBC  WBC 4.0 - 10.5 K/uL 8.6   Hemoglobin 12.0 - 15.0 g/dL 13.0   Hematocrit 36.0 - 46.0 % 39.2   Platelets 150 - 400 K/uL 254     No images are attached to the encounter.  No results found.   Assessment and plan- Patient is a 66 y.o. female with metastatic ER positive lobular breast cancer with the sole site of metastases being duodenal obstruction s/p palliative gastrojejunostomy.  She had evidence of obstructive jaundice s/p external biliary drain placement.  She is currently on Ibrance and letrozole and this is a routine follow-up visit   Patient is doing well. Reviewed labs. Continue Ibrance 75 mg 3 weeks on and 1 week off. Continue Letrozole.  She has upcoming scans and then sees Dr. Janese Banks for follow up in 2 weeks.   Abnormal LFTs: Essentially stable. Doing well post biliary drain exchange placement.     Visit Diagnosis 1. Primary malignant neoplasm of breast with metastasis ([place])   2. High risk medication use   3. AKI (acute kidney injury) ([place])   4. Abnormal LFTs  Dr. Randa Evens, MD, MPH [name] [phone] 01/18/2022 4:08 PM

## 2022-01-19 ENCOUNTER — Encounter: Payer: Self-pay | Admitting: Oncology

## 2022-01-19 ENCOUNTER — Other Ambulatory Visit: Payer: Self-pay | Admitting: Interventional Radiology

## 2022-01-19 ENCOUNTER — Ambulatory Visit
Admission: RE | Admit: 2022-01-19 | Discharge: 2022-01-19 | Disposition: A | Discharge status: Home / Self Care | Payer: Medicare HMO | Source: Ambulatory Visit | Attending: Interventional Radiology | Admitting: Interventional Radiology

## 2022-01-19 DIAGNOSIS — K819 Cholecystitis, unspecified: Secondary | ICD-10-CM

## 2022-01-19 DIAGNOSIS — T85638A Leakage of other specified internal prosthetic devices, implants and grafts, initial encounter: Secondary | ICD-10-CM | POA: Diagnosis not present

## 2022-01-19 DIAGNOSIS — K831 Obstruction of bile duct: Secondary | ICD-10-CM

## 2022-01-19 HISTORY — PX: IR EXCHANGE BILIARY DRAIN: IMG6046

## 2022-01-19 MED ORDER — LIDOCAINE HCL 1 % IJ SOLN
INTRAMUSCULAR | Status: AC
  Administered 2022-01-19: 7 mL
  Filled 2022-01-19: qty 20

## 2022-01-19 MED ORDER — IOHEXOL 350 MG/ML SOLN
12.0000 mL | Freq: Once | INTRAVENOUS | Status: AC | PRN
  Administered 2022-01-19: 12 mL

## 2022-01-19 NOTE — Procedures (Signed)
Interventional Radiology Procedure Note  Procedure: Biliary drain check and exchange  Findings: Please refer to procedural dictation for full description. Indwelling left drain was advanced slightly, excluding left biliary ducts, likely etiology of leakage.  Persistent right sided biliary ductal dilation.  The distal drain appeared clogged.  Drain successfully exchanged for new 10 Fr biliary drain.  Flushing still didn't exit into duodenum, however placing catheter in the tip of the drain and injecting demonstrated patent duodenum.  Placed to bag drainage.  Complications: None immediate  Estimated Blood Loss: <5 mL  Recommendations: Keep to bag drainage for now given limited antegrade flow into bowel, I am concerned that she would become cholestatic if capped/internalized.  She would be best served by a biliary stent and getting rid of this drain.  I discussed these risks and benefits in depth, in relation to her overall clinical picture, and she agrees.  We will arrange for her to return for biliary stent placement with moderate sedation and removal of biliary drain in the coming weeks.     Ruthann Cancer, MD Pager: [phone]

## 2022-01-25 ENCOUNTER — Encounter
Admission: RE | Admit: 2022-01-25 | Discharge: 2022-01-25 | Disposition: A | Discharge status: Home / Self Care | Payer: Medicare HMO | Source: Ambulatory Visit | Attending: Oncology | Admitting: Oncology

## 2022-01-25 ENCOUNTER — Ambulatory Visit
Admission: RE | Admit: 2022-01-25 | Discharge: 2022-01-25 | Disposition: A | Discharge status: Home / Self Care | Payer: Medicare HMO | Source: Ambulatory Visit | Attending: Oncology | Admitting: Oncology

## 2022-01-25 DIAGNOSIS — N200 Calculus of kidney: Secondary | ICD-10-CM | POA: Diagnosis not present

## 2022-01-25 DIAGNOSIS — K3189 Other diseases of stomach and duodenum: Secondary | ICD-10-CM | POA: Diagnosis not present

## 2022-01-25 DIAGNOSIS — K5730 Diverticulosis of large intestine without perforation or abscess without bleeding: Secondary | ICD-10-CM | POA: Insufficient documentation

## 2022-01-25 DIAGNOSIS — J984 Other disorders of lung: Secondary | ICD-10-CM | POA: Diagnosis not present

## 2022-01-25 DIAGNOSIS — I2510 Atherosclerotic heart disease of native coronary artery without angina pectoris: Secondary | ICD-10-CM | POA: Insufficient documentation

## 2022-01-25 DIAGNOSIS — C50919 Malignant neoplasm of unspecified site of unspecified female breast: Secondary | ICD-10-CM | POA: Insufficient documentation

## 2022-01-25 DIAGNOSIS — I700 Atherosclerosis of aorta: Secondary | ICD-10-CM | POA: Diagnosis not present

## 2022-01-25 DIAGNOSIS — M19012 Primary osteoarthritis, left shoulder: Secondary | ICD-10-CM | POA: Diagnosis not present

## 2022-01-25 DIAGNOSIS — K6389 Other specified diseases of intestine: Secondary | ICD-10-CM | POA: Diagnosis not present

## 2022-01-25 DIAGNOSIS — K8689 Other specified diseases of pancreas: Secondary | ICD-10-CM | POA: Diagnosis not present

## 2022-01-25 DIAGNOSIS — Z853 Personal history of malignant neoplasm of breast: Secondary | ICD-10-CM | POA: Diagnosis not present

## 2022-01-25 MED ORDER — TECHNETIUM TC 99M MEDRONATE IV KIT
20.0000 | PACK | Freq: Once | INTRAVENOUS | Status: AC | PRN
  Administered 2022-01-25: 20.66 via INTRAVENOUS

## 2022-01-25 MED ORDER — IOHEXOL 300 MG/ML  SOLN
80.0000 mL | Freq: Once | INTRAMUSCULAR | Status: AC | PRN
  Administered 2022-01-25: 80 mL via INTRAVENOUS

## 2022-01-30 ENCOUNTER — Ambulatory Visit: Payer: MEDICAID | Admitting: Radiology

## 2022-02-01 ENCOUNTER — Other Ambulatory Visit: Payer: Self-pay | Admitting: Oncology

## 2022-02-07 ENCOUNTER — Other Ambulatory Visit (HOSPITAL_COMMUNITY): Payer: Self-pay | Admitting: Student

## 2022-02-07 DIAGNOSIS — C50919 Malignant neoplasm of unspecified site of unspecified female breast: Secondary | ICD-10-CM

## 2022-02-07 NOTE — H&P (Signed)
Chief Complaint: Patient was seen in consultation today for duodenal metastases with gastric outlet obstruction at the request of Suttle,Dylan J  Referring Physician(s): Suzette Battiest  Supervising Physician: Ruthann Cancer  Patient Status: [facility] - Out-pt  History of Present Illness: Diamond Hinton is a 66 y.o. 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 Pt has returned to IR for several biliary drain exchanges since. During last exchange 01/19/22, Dr. Serafina Royals recommended pt to return in 2-4 weeks for removal of indwelling biliary drain and placement of metallic biliary stent.   Past Medical History:  Diagnosis Date   Anemia    Anxiety    Breast cancer ([place])    Gallstones    GERD (gastroesophageal reflux disease)    Hyperlipidemia    Hypertension    Jaundice 08/29/2021   Rash 08/29/2021   Right upper quadrant abdominal pain 08/29/2021   UTI (urinary tract infection) 08/29/2021    Past Surgical History:  Procedure Laterality Date   BILATERAL SALPINGECTOMY  04/03/1997   CHOLECYSTECTOMY N/A 07/07/2015   Procedure: LAPAROSCOPIC CHOLECYSTECTOMY ;  Surgeon: Jules Husbands, MD;  Location: [facility] ORS;  Service: General;  Laterality: N/A;   ESOPHAGOGASTRODUODENOSCOPY  08/30/2021   Procedure: ESOPHAGOGASTRODUODENOSCOPY (EGD);  Surgeon: Lucilla Lame, MD;  Location: [facility] ENDOSCOPY;  Service: Endoscopy;;   GALLBLADDER SURGERY  07/07/2015   [facility]   IR BILIARY DRAIN PLACEMENT WITH CHOLANGIOGRAM  08/31/2021   IR CONVERT BILIARY DRAIN TO INT EXT BILIARY DRAIN  09/02/2021   IR EXCHANGE BILIARY DRAIN  10/10/2021   IR EXCHANGE BILIARY DRAIN  12/05/2021   IR EXCHANGE BILIARY DRAIN  01/19/2022    Allergies: Peanuts [peanut oil]  Medications: Prior to Admission medications   Medication Sig Start Date End Date Taking? Authorizing Provider  acetaminophen (TYLENOL) 325 MG tablet Take 325 mg by mouth every 6 (six) hours as needed  (for pain and sometimes takes 2 if pain is worse).    [provider]  feeding supplement (ENSURE ENLIVE / ENSURE PLUS) LIQD Take 237 mLs by mouth 3 (three) times daily between meals. 09/14/21   Annita Brod, MD  letrozole [facility]) 2.5 MG tablet Take 1 tablet by mouth once daily 02/02/22   Sindy Guadeloupe, MD  levothyroxine (SYNTHROID) 88 MCG tablet Take 1 tablet (88 mcg total) by mouth daily. 10/20/21   Virginia Crews, MD  Multiple Vitamins-Minerals (CENTRUM VITAMINTS PO) Take 1 tablet by mouth daily.    [provider]  ondansetron (ZOFRAN) 4 MG tablet Take 1 tablet (4 mg total) by mouth daily as needed for nausea or vomiting. 12/07/21 12/07/22  Sindy Guadeloupe, MD  palbociclib [facility]) 75 MG tablet Take 1 tablet (75 mg total) by mouth daily. Take for 21 days on, 7 days off, repeat every 28 days. 11/21/21   Sindy Guadeloupe, MD  pantoprazole (PROTONIX) 40 MG tablet Take 40 mg by mouth daily. 12/29/21   [provider]  prochlorperazine (COMPAZINE) 5 MG tablet Take 1 tablet (5 mg total) by mouth every 6 (six) hours as needed for refractory nausea / vomiting. 12/07/21   Sindy Guadeloupe, MD  Simethicone (GAS-X PO) Take by mouth. PRN Patient not taking: Reported on 01/18/2022    [provider]  traZODone (DESYREL) 50 MG tablet Take 1 tablet (50 mg total) by mouth at bedtime as needed for sleep. Patient not taking: Reported on 11/10/2021 10/07/21   Hughie Closs, PA-C  Family History  Problem Relation Age of Onset   Hypertension Mother    CVA Mother    Ulcers Mother    Emphysema Mother    Heart disease Father    Prostate cancer Father    Macular degeneration Father    Hypertension Sister    Diabetes Sister    Cervical polyp Sister    Hypertension Sister    Diabetes Sister    Lymphoma Sister     Social History   Socioeconomic History   Marital status: Married    Spouse name: Not on file   Number of children: Not on file   Years of education:  Not on file   Highest education level: Not on file  Occupational History   Not on file  Tobacco Use   Smoking status: Never   Smokeless tobacco: Never  Vaping Use   Vaping Use: Never used  Substance and Sexual Activity   Alcohol use: No   Drug use: No   Sexual activity: Yes  Other Topics Concern   Not on file  Social History Narrative   Not on file   Social Determinants of Health   Financial Resource Strain: Low Risk  (11/24/2021)   Overall Financial Resource Strain (CARDIA)    Difficulty of Paying Living Expenses: Not hard at all  Food Insecurity: No Food Insecurity (11/24/2021)   Hunger Vital Sign    Worried About Running Out of Food in the Last Year: Never true    Ran Out of Food in the Last Year: Never true  Transportation Needs: No Transportation Needs (11/24/2021)   PRAPARE - Hydrologist (Medical): No    Lack of Transportation (Non-Medical): No  Physical Activity: Not on file  Stress: No Stress Concern Present (11/24/2021)   [place]    Feeling of Stress : Only a little  Social Connections: Unknown (11/24/2021)   Social Connection and Isolation Panel [NHANES]    Frequency of Communication with Friends and Family: Three times a week    Frequency of Social Gatherings with Friends and Family: Three times a week    Attends Religious Services: Patient refused    Active Member of Clubs or Organizations: Patient refused    Attends Archivist Meetings: Patient refused    Marital Status: Married    ECOG Status: {CHL ONC ECOG ZO:[id number]}  Review of Systems: A 12 point ROS discussed and pertinent positives are indicated in the HPI above.  All other systems are negative.  Review of Systems  Vital Signs: There were no vitals taken for this visit.  Advance Care Plan: {Advance Care WJXB:[id number]}    Physical Exam  Imaging: NM Bone Scan Whole Body  Result Date:  01/26/2022 CLINICAL DATA:  Metastatic breast cancer EXAM: NUCLEAR MEDICINE WHOLE BODY BONE SCAN TECHNIQUE: Whole body anterior and posterior images were obtained approximately 3 hours after intravenous injection of radiopharmaceutical. RADIOPHARMACEUTICALS:  Twenty-one mCi Technetium-50mMDP IV COMPARISON:  Multiple exams, including bone scan 09/05/2021 FINDINGS: Degenerative findings noted along the shoulders and left sternoclavicular joint. Expected bilateral renal activity. No significant focal abnormal bony activity to indicate the presence of osseous metastatic disease. IMPRESSION: 1. No findings of osseous metastatic disease. 2. Degenerative findings along the shoulders and left sternoclavicular joint. Electronically Signed   By: WVan ClinesM.D.   On: 01/26/2022 12:41   CT CHEST ABDOMEN PELVIS W CONTRAST  Result Date: 01/26/2022 CLINICAL DATA:  Breast cancer  restaging. Prior metastatic disease to the duodenum. * Tracking Code: BO * EXAM: CT CHEST, ABDOMEN, AND PELVIS WITH CONTRAST TECHNIQUE: Multidetector CT imaging of the chest, abdomen and pelvis was performed following the standard protocol during bolus administration of intravenous contrast. RADIATION DOSE REDUCTION: This exam was performed according to the departmental dose-optimization program which includes automated exposure control, adjustment of the mA and/or kV according to patient size and/or use of iterative reconstruction technique. CONTRAST:  60m OMNIPAQUE IOHEXOL 300 MG/ML  SOLN COMPARISON:  Multiple exams, including CT abdomen 11/01/2021 and CT chest 08/13/2021 FINDINGS: CT CHEST FINDINGS Cardiovascular: Coronary, aortic arch, and branch vessel atherosclerotic vascular disease. Mediastinum/Nodes: Unremarkable Lungs/Pleura: Stable scarring in both lower lobes. Musculoskeletal: Thoracic spondylosis. Markedly reduced size/conspicuity of the left lower breast mass. CT ABDOMEN PELVIS FINDINGS Hepatobiliary: Percutaneous biliary drain  enters via the left hepatic lobe system and extends down into the transverse duodenum. Cholecystectomy. Pancreas: Mild indistinctness of tissue planes along the pancreaticoduodenal region possibly due to low-grade inflammation but technically nonspecific. Dorsal pancreatic duct dilatation up to 0.8 cm in the pancreatic head., similar to mildly increased from prior although more conspicuous due to the presence of IV contrast on today's exam. Spleen: Unremarkable Adrenals/Urinary Tract: Adrenal glands appear normal. Three nonobstructive right renal calculi measure up to 0.3 cm in long axis. Left kidney lower pole nonobstructive calculus 0.8 cm in long axis. Stomach/Bowel: There is potential mild wall thickening in the transverse duodenum in the area of the biliary drain, for example on image 57 series 5, especially superiorly appearance is nonspecific could be from some local inflammation. Gastrojejunostomy noted. Redundant sigmoid colon with sigmoid diverticulosis. Upper normal amount of stool in the colon. No current dilated small bowel. Vascular/Lymphatic: Atherosclerosis is present, including aortoiliac atherosclerotic disease. Small retroperitoneal lymph nodes are present but there is no current pathologic adenopathy observed. Reproductive: Unremarkable Other: Trace free pelvic fluid, nonspecific. Musculoskeletal: Lower lumbar spondylosis and degenerative disc disease. IMPRESSION: 1. Markedly reduced size/conspicuity of the left lower breast mass. 2. Duodenal wall thickening particularly in the transverse duodenum, with some indistinctness of tissue planes between the duodenum and the adjacent pancreas. 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 A percutaneous left biliary drainage extends through the left biliary system and to the distal transverse duodenum; contrast from the stomach extends through the duodenal  lumen to the jejunum, and the patient also has a patent gastrojejunostomy. 3. Other imaging findings of potential clinical significance: Aortic Atherosclerosis (ICD10-I70.0). Coronary atherosclerosis. Stable bilateral lower lobe scarring. Bilateral nonobstructive nephrolithiasis. Sigmoid colon diverticulosis. Trace amount of free pelvic fluid, nonspecific. Electronically Signed   By: WVan ClinesM.D.   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 The patient presents with leakage from and pain at the catheter site. EXAM: Fluoroscopic guided biliary drain exchange MEDICATIONS: None. ANESTHESIA/SEDATION: None. FLUOROSCOPY TIME:  399.3mGy COMPLICATIONS: None immediate. PROCEDURE: Informed written consent was obtained from the patient after a thorough discussion of the procedural risks, benefits and alternatives. All questions were addressed. Maximal Sterile Barrier Technique was utilized including caps, mask, sterile gowns, sterile gloves, sterile drape, hand hygiene and skin antiseptic. A timeout was performed prior to the initiation of the procedure. The indwelling left-sided biliary drain was prepped  and draped in standard fashion. Preprocedure spot radiograph demonstrated slight propagation of the radiopaque marker into common bile, likely excluding left biliary tree. Gentle hand injection of contrast demonstrated flow into the dilated right hepatic ducts without opacification of the left biliary tree or antegrade flow into the duodenum. Subdermal Local anesthesia was provided to the skin entry site with 1% lidocaine. The help portion of the drain was cut to release the pigtail retention string. A superstiff Amplatz wire was inserted in straighten the pigtail  portion within the duodenum. The indwelling catheter was removed over the wire. A new, 10.2 [place] biliary drain was then placed over the wire with the pigtail portion coiled in the duodenum. Hand injection of contrast again demonstrated no evidence of antegrade flow into the duodenum and preferential flow into the dilated right biliary tree. A 5 French angled tip catheter was inserted to the duodenal portion of the biliary drain and contrast was injected which demonstrated appropriate opacification of the third portion of the duodenum which do not appear to be affected by metastasis. The drain was affixed to the skin with a 0 silk interrupted suture. A sterile bandage was applied. The patient tolerated the procedure well and was transferred home in condition. IMPRESSION: 1. Interval antegrade migration of the indwelling left-sided biliary drain, likely excluding the left-sided biliary ducts. The distal aspect of the indwelling drain appeared to be clogged. 2. Successful exchange of 10.2 French left biliary drain with improved position. PLAN: Keep drain to bag drainage. Return in 2-4 weeks for metallic biliary stent placement and removal of indwelling biliary drain. Ruthann Cancer, MD [facility] Electronically Signed   By: Ruthann Cancer M.D.   On: 01/19/2022 13:21    Labs:  CBC: Recent Labs    12/14/21 1312 12/21/21 1440 01/04/22 1333 01/18/22 1354  WBC 6.4 5.9 7.6 8.6  HGB 13.7 11.6* 11.4* 13.0  HCT 40.3 35.1* 34.9* 39.2  PLT 245 182 311 254    COAGS: Recent Labs    08/31/21 0859 10/07/21 2246  INR 1.1 1.2    BMP: Recent Labs    12/14/21 1312 12/21/21 1440 01/04/22 1333 01/18/22 1354  NA 131* 132* 137 139  K 4.4 4.0 3.8 3.8  CL 105 108 111 106  CO2 16* 16* 21* 23  GLUCOSE 143* 150* 161* 132*  BUN 40* 40* 17 21  CALCIUM 9.5 8.7* 8.7* 9.4  CREATININE 1.66* 1.42* 1.13* 1.26*  GFRNONAA 34* 41* 53* 47*    LIVER FUNCTION  TESTS: Recent Labs    12/14/21 1312 12/21/21 1440 01/04/22 1333 01/18/22 1354  BILITOT 0.5 0.7 0.4 0.5  AST 38 31 43* 58*  ALT 63* 54* 58* 79*  ALKPHOS 101 84 90 82  PROT 7.8 6.6 6.8 7.1  ALBUMIN 4.3 3.8 3.7 4.2    TUMOR MARKERS: No results for input(s): "AFPTM", "CEA", "CA199", "CHROMGRNA" in the last 8760 hours.  Assessment and Plan:  Risks and benefits of *** discussed with the patient including, but not limited to bleeding, infection which may lead to sepsis or even death and damage to adjacent structures.  This interventional procedure involves the use of X-rays and because of the nature of the planned procedure, it is possible that we will have prolonged use of X-ray fluoroscopy.  Potential radiation risks to you include (but are not limited to) the following: - A slightly elevated risk for cancer  several years later in life. This risk is typically less than 0.5%  percent. This risk is low in comparison to the normal incidence of human cancer, which is 33% for women and 50% for men according to the [place]. - Radiation induced injury can include skin redness, resembling a rash, tissue breakdown / ulcers and hair loss (which can be temporary or permanent).   The likelihood of either of these occurring depends on the difficulty of the procedure and whether you are sensitive to radiation due to previous procedures, disease, or genetic conditions.   IF your procedure requires a prolonged use of radiation, you will be notified and given written instructions for further action.  It is your responsibility to monitor the irradiated area for the 2 weeks following the procedure and to notify your physician if you are concerned that you have suffered a radiation induced injury.    All of the patient's questions were answered, patient is agreeable to proceed.  Consent signed and in chart.  ***   Thank you for this interesting consult.  I greatly enjoyed meeting  Diamond Hinton and look forward to participating in their care.  A copy of this report was sent to the requesting provider on this date.  Electronically Signed: Tyson Alias, NP 02/07/2022, 1:21 PM   I spent a total of {New ZOXW:[id number]} {New Out-Pt:[id number]}  {Established Out-Pt:[id number]} in face to face in clinical consultation, greater than 50% of which was counseling/coordinating care for ***

## 2022-02-08 ENCOUNTER — Ambulatory Visit: Payer: Medicare HMO | Admitting: Oncology

## 2022-02-08 ENCOUNTER — Encounter: Payer: Self-pay | Admitting: Radiology

## 2022-02-08 ENCOUNTER — Other Ambulatory Visit: Payer: Medicare HMO

## 2022-02-08 ENCOUNTER — Other Ambulatory Visit: Payer: Self-pay | Admitting: Interventional Radiology

## 2022-02-08 ENCOUNTER — Ambulatory Visit
Admission: RE | Admit: 2022-02-08 | Discharge: 2022-02-08 | Disposition: A | Discharge status: Home / Self Care | Payer: Medicare HMO | Source: Ambulatory Visit | Attending: Interventional Radiology | Admitting: Interventional Radiology

## 2022-02-08 DIAGNOSIS — C784 Secondary malignant neoplasm of small intestine: Secondary | ICD-10-CM | POA: Diagnosis not present

## 2022-02-08 DIAGNOSIS — K311 Adult hypertrophic pyloric stenosis: Secondary | ICD-10-CM | POA: Insufficient documentation

## 2022-02-08 DIAGNOSIS — C50919 Malignant neoplasm of unspecified site of unspecified female breast: Secondary | ICD-10-CM | POA: Insufficient documentation

## 2022-02-08 DIAGNOSIS — K831 Obstruction of bile duct: Secondary | ICD-10-CM

## 2022-02-08 DIAGNOSIS — Z853 Personal history of malignant neoplasm of breast: Secondary | ICD-10-CM | POA: Diagnosis not present

## 2022-02-08 HISTORY — PX: IR BILIARY STENT(S) NEW ACCESS WITH DRAIN: IMG6050

## 2022-02-08 LAB — CBC
HCT: 38.6 % (ref 36.0–46.0)
Hemoglobin: 12.9 g/dL (ref 12.0–15.0)
MCH: 30.4 pg (ref 26.0–34.0)
MCHC: 33.4 g/dL (ref 30.0–36.0)
MCV: 90.8 fL (ref 80.0–100.0)
Platelets: 279 10*3/uL (ref 150–400)
RBC: 4.25 MIL/uL (ref 3.87–5.11)
RDW: 14.3 % (ref 11.5–15.5)
WBC: 7.8 10*3/uL (ref 4.0–10.5)
nRBC: 0 % (ref 0.0–0.2)

## 2022-02-08 LAB — BASIC METABOLIC PANEL
Anion gap: 8 (ref 5–15)
BUN: 19 mg/dL (ref 8–23)
CO2: 24 mmol/L (ref 22–32)
Calcium: 9.5 mg/dL (ref 8.9–10.3)
Chloride: 111 mmol/L (ref 98–111)
Creatinine, Ser: 1.17 mg/dL — ABNORMAL HIGH (ref 0.44–1.00)
GFR, Estimated: 51 mL/min — ABNORMAL LOW (ref >60)
Glucose, Bld: 127 mg/dL — ABNORMAL HIGH (ref 70–99)
Potassium: 3.1 mmol/L — ABNORMAL LOW (ref 3.5–5.1)
Sodium: 143 mmol/L (ref 135–145)

## 2022-02-08 LAB — PROTIME-INR
INR: 1.3 — ABNORMAL HIGH (ref 0.8–1.2)
Prothrombin Time: 16.1 seconds — ABNORMAL HIGH (ref 11.4–15.2)

## 2022-02-08 MED ORDER — LIDOCAINE HCL 1 % IJ SOLN
INTRAMUSCULAR | Status: AC
  Administered 2022-02-08: 10 mL
  Filled 2022-02-08: qty 20

## 2022-02-08 MED ORDER — MIDAZOLAM HCL 2 MG/2ML IJ SOLN
INTRAMUSCULAR | Status: AC
  Filled 2022-02-08: qty 2

## 2022-02-08 MED ORDER — SODIUM CHLORIDE 0.9 % IV SOLN
2.0000 g | INTRAVENOUS | Status: DC
  Filled 2022-02-08: qty 2

## 2022-02-08 MED ORDER — FENTANYL CITRATE (PF) 100 MCG/2ML IJ SOLN
INTRAMUSCULAR | Status: AC | PRN
  Administered 2022-02-08 (×2): 50 ug via INTRAVENOUS

## 2022-02-08 MED ORDER — SODIUM CHLORIDE 0.9 % IV SOLN
INTRAVENOUS | Status: AC | PRN
  Administered 2022-02-08: 2 g via INTRAVENOUS

## 2022-02-08 MED ORDER — MIDAZOLAM HCL 2 MG/2ML IJ SOLN
INTRAMUSCULAR | Status: AC | PRN
  Administered 2022-02-08 (×2): 1 mg via INTRAVENOUS

## 2022-02-08 MED ORDER — SODIUM CHLORIDE 0.9 % IV SOLN
INTRAVENOUS | Status: DC
  Filled 2022-02-08: qty 1000

## 2022-02-08 MED ORDER — FENTANYL CITRATE (PF) 100 MCG/2ML IJ SOLN
INTRAMUSCULAR | Status: AC
  Filled 2022-02-08: qty 2

## 2022-02-08 MED ORDER — IOHEXOL 350 MG/ML SOLN
50.0000 mL | Freq: Once | INTRAVENOUS | Status: AC | PRN
  Administered 2022-02-08: 50 mL

## 2022-02-08 NOTE — Procedures (Signed)
Interventional Radiology Procedure Note  Procedure:  1) Cholangiogram 2) Biliary stent placement 3) Biliary drain removal  Findings: Please refer to procedural dictation for full description. 8 mm biliary stent placed from biliary confluence through common bile duct and into the 3rd portion of duodenum.  Drain removed.  Complications: None immediate  Estimated Blood Loss: < 5 mL  Recommendations: Dressing changes as needed.  Expect leakage at skin site to resolve in 1-2 days. Follow up in 2-4 weeks in IR clinic.   Ruthann Cancer, MD

## 2022-02-09 ENCOUNTER — Other Ambulatory Visit (HOSPITAL_COMMUNITY): Payer: Self-pay

## 2022-02-14 ENCOUNTER — Encounter: Payer: Self-pay | Admitting: Oncology

## 2022-02-14 ENCOUNTER — Inpatient Hospital Stay: Payer: Medicare HMO

## 2022-02-14 ENCOUNTER — Inpatient Hospital Stay: Payer: Medicare HMO | Attending: Oncology | Admitting: Oncology

## 2022-02-14 VITALS — BP 191/90 | HR 65 | Temp 99.3°F | Resp 16 | Wt 139.3 lb

## 2022-02-14 DIAGNOSIS — C50919 Malignant neoplasm of unspecified site of unspecified female breast: Secondary | ICD-10-CM | POA: Insufficient documentation

## 2022-02-14 DIAGNOSIS — Z79811 Long term (current) use of aromatase inhibitors: Secondary | ICD-10-CM | POA: Diagnosis not present

## 2022-02-14 DIAGNOSIS — C784 Secondary malignant neoplasm of small intestine: Secondary | ICD-10-CM | POA: Insufficient documentation

## 2022-02-14 DIAGNOSIS — Z79899 Other long term (current) drug therapy: Secondary | ICD-10-CM | POA: Insufficient documentation

## 2022-02-14 LAB — COMPREHENSIVE METABOLIC PANEL
ALT: 36 U/L (ref 0–44)
AST: 27 U/L (ref 15–41)
Albumin: 3.5 g/dL (ref 3.5–5.0)
Alkaline Phosphatase: 78 U/L (ref 38–126)
Anion gap: 9 (ref 5–15)
BUN: 17 mg/dL (ref 8–23)
CO2: 33 mmol/L — ABNORMAL HIGH (ref 22–32)
Calcium: 8.7 mg/dL — ABNORMAL LOW (ref 8.9–10.3)
Chloride: 97 mmol/L — ABNORMAL LOW (ref 98–111)
Creatinine, Ser: 1.12 mg/dL — ABNORMAL HIGH (ref 0.44–1.00)
GFR, Estimated: 54 mL/min — ABNORMAL LOW (ref >60)
Glucose, Bld: 171 mg/dL — ABNORMAL HIGH (ref 70–99)
Potassium: 3.3 mmol/L — ABNORMAL LOW (ref 3.5–5.1)
Sodium: 139 mmol/L (ref 135–145)
Total Bilirubin: 0.2 mg/dL — ABNORMAL LOW (ref 0.3–1.2)
Total Protein: 6.1 g/dL — ABNORMAL LOW (ref 6.5–8.1)

## 2022-02-14 LAB — CBC WITH DIFFERENTIAL/PLATELET
Abs Immature Granulocytes: 0.03 10*3/uL (ref 0.00–0.07)
Basophils Absolute: 0.1 10*3/uL (ref 0.0–0.1)
Basophils Relative: 1 %
Eosinophils Absolute: 0.1 10*3/uL (ref 0.0–0.5)
Eosinophils Relative: 1 %
HCT: 34 % — ABNORMAL LOW (ref 36.0–46.0)
Hemoglobin: 11.3 g/dL — ABNORMAL LOW (ref 12.0–15.0)
Immature Granulocytes: 0 %
Lymphocytes Relative: 28 %
Lymphs Abs: 1.9 10*3/uL (ref 0.7–4.0)
MCH: 31.2 pg (ref 26.0–34.0)
MCHC: 33.2 g/dL (ref 30.0–36.0)
MCV: 93.9 fL (ref 80.0–100.0)
Monocytes Absolute: 0.5 10*3/uL (ref 0.1–1.0)
Monocytes Relative: 7 %
Neutro Abs: 4.2 10*3/uL (ref 1.7–7.7)
Neutrophils Relative %: 63 %
Platelets: 195 10*3/uL (ref 150–400)
RBC: 3.62 MIL/uL — ABNORMAL LOW (ref 3.87–5.11)
RDW: 13.9 % (ref 11.5–15.5)
WBC: 6.7 10*3/uL (ref 4.0–10.5)
nRBC: 0 % (ref 0.0–0.2)

## 2022-02-14 MED ORDER — ONDANSETRON HCL 4 MG PO TABS: 4.0000 mg | ORAL_TABLET | Freq: Every day | ORAL | 1 refills | Status: DC | PRN

## 2022-02-14 MED ORDER — PROCHLORPERAZINE MALEATE 5 MG PO TABS: 5.0000 mg | ORAL_TABLET | Freq: Four times a day (QID) | ORAL | 1 refills | Status: DC | PRN

## 2022-02-14 MED ORDER — POTASSIUM CHLORIDE CRYS ER 20 MEQ PO TBCR
20.0000 meq | EXTENDED_RELEASE_TABLET | Freq: Every day | ORAL | 0 refills | Status: DC
Start: 2022-02-14 — End: 2022-03-21

## 2022-02-14 NOTE — Progress Notes (Signed)
Pt in for follow up reports had biliary stents removed last week.

## 2022-02-16 ENCOUNTER — Other Ambulatory Visit: Payer: Self-pay | Admitting: Pharmacist

## 2022-02-16 DIAGNOSIS — C50912 Malignant neoplasm of unspecified site of left female breast: Secondary | ICD-10-CM

## 2022-02-16 MED ORDER — PALBOCICLIB 75 MG PO TABS: 75.0000 mg | ORAL_TABLET | Freq: Every day | ORAL | 4 refills | Status: DC

## 2022-02-19 NOTE — Progress Notes (Signed)
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  She was found to have diffuse duodenal wall thickening as well as a palpable mass in the left breast.  Duodenal biopsy as well as left breast biopsy was consistent withER positive greater than 90%, PR weakly +1 to 10% HER2 negative lobular breast cancer.  Patient underwent external biliary drain for the obstructive jaundice and palliative gastrojejunostomy surgery to relieve her gastric outlet obstruction.CT scan otherwise did not show any evidence of distant metastatic disease.   Patient was started on letrozole plus Kisqali in early April 2023.  However patient developed AKI and hyperuricemia requiring hospitalizations.  Thelma Comp was therefore stopped and patient was started on Ibrance 75 mg in May 2023.    Interval history-patient is tolerating Ibrance and letrozole well without any significant side effects.  Her appetite is improving and she has put on weight.  Denies any abdominal pain nausea vomiting or diarrhea.  ECOG PS- 1 Pain scale- 0   Review of systems- Review of Systems  Constitutional:  Negative for chills, fever, malaise/fatigue and weight loss.  HENT:  Negative for congestion, ear discharge and nosebleeds.   Eyes:  Negative for blurred vision.  Respiratory:   Negative for cough, hemoptysis, sputum production, shortness of breath and wheezing.   Cardiovascular:  Negative for chest pain, palpitations, orthopnea and claudication.  Gastrointestinal:  Negative for abdominal pain, blood in stool, constipation, diarrhea, heartburn, melena, nausea and vomiting.  Genitourinary:  Negative for dysuria, flank pain, frequency, hematuria and urgency.  Musculoskeletal:  Negative for back pain, joint pain and myalgias.  Skin:  Negative for rash.  Neurological:  Negative for dizziness, tingling, focal weakness, seizures, weakness and headaches.  Endo/Heme/Allergies:  Does not bruise/bleed easily.  Psychiatric/Behavioral:  Negative for depression and suicidal ideas. The patient does not have insomnia.       Allergies  Allergen Reactions   Peanuts [Peanut Oil] Shortness Of Breath and Itching     Past Medical History:  Diagnosis Date   Anemia    Anxiety    Breast cancer ([place])    Gallstones    GERD (gastroesophageal reflux disease)    Hyperlipidemia    Hypertension    Jaundice 08/29/2021   Rash 08/29/2021   Right upper quadrant abdominal pain 08/29/2021   UTI (urinary tract infection) 08/29/2021     Past Surgical History:  Procedure Laterality Date   BILATERAL SALPINGECTOMY  04/03/1997   CHOLECYSTECTOMY N/A 07/07/2015   Procedure: LAPAROSCOPIC CHOLECYSTECTOMY ;  Surgeon: Jules Husbands, MD;  Location: [facility] ORS;  Service: General;  Laterality: N/A;   ESOPHAGOGASTRODUODENOSCOPY  08/30/2021   Procedure: ESOPHAGOGASTRODUODENOSCOPY (EGD);  Surgeon: Lucilla Lame, MD;  Location: [facility] ENDOSCOPY;  Service: Endoscopy;;   GALLBLADDER SURGERY  07/07/2015   [facility]   IR BILIARY DRAIN PLACEMENT WITH CHOLANGIOGRAM  08/31/2021   IR BILIARY STENT(S) NEW ACCESS WITH DRAIN  02/08/2022   IR CONVERT BILIARY DRAIN TO INT EXT BILIARY DRAIN  09/02/2021   IR EXCHANGE BILIARY DRAIN  10/10/2021   IR EXCHANGE BILIARY DRAIN  12/05/2021   IR EXCHANGE BILIARY DRAIN  01/19/2022    Social  History   Socioeconomic History   Marital status: Married    Spouse name: Not on file   Number of children: Not on file   Years of education: Not on file   Highest education level: Not on file  Occupational History   Not on file  Tobacco Use   Smoking status: Never   Smokeless tobacco: Never  Vaping Use   Vaping Use: Never used  Substance and Sexual Activity   Alcohol use: No   Drug use: No   Sexual activity: Yes  Other Topics Concern   Not on file  Social History Narrative   Not on file   Social Determinants of Health   Financial Resource Strain: Low Risk  (11/24/2021)   Overall Financial Resource Strain (CARDIA)    Difficulty of Paying Living Expenses: Not hard at all  Food Insecurity: No Food Insecurity (11/24/2021)   Hunger Vital Sign    Worried About Running Out of Food in the Last Year: Never true    [place] in the Last Year: Never true  Transportation Needs: No Transportation Needs (11/24/2021)   PRAPARE - Hydrologist (Medical): No    Lack of Transportation (Non-Medical): No  Physical Activity: Not on file  Stress: No Stress Concern Present (11/24/2021)   [place]    Feeling of Stress : Only a little  Social Connections: Unknown (11/24/2021)   Social Connection and Isolation Panel [NHANES]    Frequency of Communication with Friends and Family: Three times a week    Frequency of Social Gatherings with Friends and Family: Three times a week    Attends Religious Services: Patient refused    Active Member of Clubs or Organizations: Patient refused    Attends Archivist Meetings: Patient refused    Marital Status: Married  Human resources officer Violence: Not on file    Family History  Problem Relation Age of Onset   Hypertension Mother    CVA Mother    Ulcers Mother    Emphysema Mother    Heart disease Father    Prostate cancer Father    Macular  degeneration Father    Hypertension Sister    Diabetes Sister    Cervical polyp Sister    Hypertension Sister    Diabetes Sister    Lymphoma Sister      Current Outpatient Medications:    acetaminophen (TYLENOL) 325 MG tablet, Take 325 mg by mouth every 6 (six) hours as needed (for pain and sometimes takes 2 if pain is worse)., Disp: , Rfl:    feeding supplement (ENSURE ENLIVE / ENSURE PLUS) LIQD, Take 237 mLs by mouth 3 (three) times daily between meals., Disp: 237 mL, Rfl: 12   letrozole (FEMARA) 2.5 MG tablet, Take 1 tablet by mouth once daily, Disp: 30 tablet, Rfl: 0   levothyroxine (SYNTHROID) 88 MCG tablet, Take 1 tablet (88 mcg total) by mouth daily., Disp: 90 tablet, Rfl: 1   Multiple Vitamins-Minerals (CENTRUM VITAMINTS PO), Take 1 tablet by mouth daily., Disp: , Rfl:    pantoprazole (PROTONIX) 40 MG tablet, Take 40 mg by mouth daily., Disp: , Rfl:    potassium chloride SA (KLOR-CON  M) 20 MEQ tablet, Take 1 tablet (20 mEq total) by mouth daily., Disp: 30 tablet, Rfl: 0   traZODone (DESYREL) 50 MG tablet, Take 1 tablet (50 mg total) by mouth at bedtime as needed for sleep., Disp: 30 tablet, Rfl: 0   ondansetron (ZOFRAN) 4 MG tablet, Take 1 tablet (4 mg total) by mouth daily as needed for nausea or vomiting., Disp: 30 tablet, Rfl: 1   palbociclib (IBRANCE) 75 MG tablet, Take 1 tablet (75 mg total) by mouth daily. Take for 21 days on, 7 days off, repeat every 28 days., Disp: 21 tablet, Rfl: 4   prochlorperazine (COMPAZINE) 5 MG tablet, Take 1 tablet (5 mg total) by mouth every 6 (six) hours as needed for refractory nausea / vomiting., Disp: 30 tablet, Rfl: 1   Simethicone (GAS-X PO), Take by mouth. PRN (Patient not taking: Reported on 01/18/2022), Disp: , Rfl:   Physical exam:  Vitals:   02/14/22 1444  BP: (!) 191/90  Pulse: 65  Resp: 16  Temp: 99.3 F (37.4 C)  TempSrc: Tympanic  Weight: 139 lb 4.8 oz (63.2 kg)   Physical Exam Constitutional:      General: She is not in  acute distress. Cardiovascular:     Rate and Rhythm: Normal rate and regular rhythm.     Heart sounds: Normal heart sounds.  Pulmonary:     Effort: Pulmonary effort is normal.     Breath sounds: Normal breath sounds.  Abdominal:     General: Bowel sounds are normal.     Palpations: Abdomen is soft.  Skin:    General: Skin is warm and dry.  Neurological:     Mental Status: She is alert and oriented to person, place, and time.         Latest Ref Rng & Units 02/14/2022    1:39 PM  CMP  Glucose 70 - 99 mg/dL 171   BUN 8 - 23 mg/dL 17   Creatinine 0.44 - 1.00 mg/dL 1.12   Sodium 135 - 145 mmol/L 139   Potassium 3.5 - 5.1 mmol/L 3.3   Chloride 98 - 111 mmol/L 97   CO2 22 - 32 mmol/L 33   Calcium 8.9 - 10.3 mg/dL 8.7   Total Protein 6.5 - 8.1 g/dL 6.1   Total Bilirubin 0.3 - 1.2 mg/dL 0.2   Alkaline Phos 38 - 126 U/L 78   AST 15 - 41 U/L 27   ALT 0 - 44 U/L 36       Latest Ref Rng & Units 02/14/2022    1:39 PM  CBC  WBC 4.0 - 10.5 K/uL 6.7   Hemoglobin 12.0 - 15.0 g/dL 11.3   Hematocrit 36.0 - 46.0 % 34.0   Platelets 150 - 400 K/uL 195     No images are attached to the encounter.  IR BILIARY STENT(S) NEW ACCESS WITH DRAIN  Result Date: 02/08/2022 INDICATION: 66 year old female with history of metastatic breast cancer to the duodenum resulting in malignant biliary obstruction necessitating percutaneous internal/external biliary drain placement on 08/31/2021, last exchanged on 01/19/2022. The patient presents for biliary stent placement and drain removal. EXAM: 1. Sheath cholangiogram. 2. Biliary stent placement. 3. Removal of indwelling biliary drain. MEDICATIONS: Rocephin 1 gm IV; The antibiotic was administered within an appropriate time frame prior to the initiation of the procedure. ANESTHESIA/SEDATION: Moderate (conscious) sedation was employed during this procedure. A total of Versed 2 mg and Fentanyl 100 mcg was administered intravenously by the radiology nurse. Total  intra-service moderate Sedation Time: 40 minutes. The patient's level of consciousness and vital signs were monitored continuously by radiology nursing throughout the procedure under my direct supervision. FLUOROSCOPY: Radiation Exposure Index (as provided by the fluoroscopic device): 49.4 mGy Kerma COMPLICATIONS: None immediate. PROCEDURE: Informed written consent was obtained from the patient after a thorough discussion of the procedural risks, benefits and alternatives. All questions were addressed. Maximal Sterile Barrier Technique was utilized including caps, mask, sterile gowns, sterile gloves, sterile drape, hand hygiene and skin antiseptic. A timeout was performed prior to the initiation of the procedure. The subxiphoid abdominal region in indwelling biliary drainage catheter were prepped and draped in standard fashion. Subdermal Local anesthesia was provided at the tube skin entry site with 1% lidocaine. Gentle hand injection demonstrated patency of the intrahepatic portion of the catheter with spillage into the right-sided biliary tree. The external portion of the catheter was cut to release the inter pigtail. An Amplatz wire was inserted into the fourth portion of the duodenum 2 1 for role the pigtail and the catheter was removed. Next, a 10 French, 33 cm dry seal sheath was inserted to the level of the duodenum. Pull-back sheath cholangiogram demonstrated persistent obstruction of the distal common bile duct and second and proximal third portion the duodenum due to mass effect. Next, the sheath was removed and 2 overlapping 8 mm Viabil stents with endholes were placed, 10 cm in length distally and 6 cm in length proximally. Completion cholangiogram demonstrated patency of the stents and decompression of the intrahepatic bile ducts. The wire was removed. A sterile bandage was applied. The patient tolerated the procedure well without immediate complication. IMPRESSION: Technically successful placement of  covered biliary stents extending from the biliary confluence to the third portion of the duodenum. The indwelling left-sided biliary drain was removed. PLAN: Follow-up in Interventional Radiology clinic in 2-4 weeks. Ruthann Cancer, MD [facility] Radiology Electronically Signed   By: Ruthann Cancer M.D.   On: 02/08/2022 16:27   NM Bone Scan Whole Body  Result Date: 01/26/2022 CLINICAL DATA:  Metastatic breast cancer EXAM: NUCLEAR MEDICINE WHOLE BODY BONE SCAN TECHNIQUE: Whole body anterior and posterior images were obtained approximately 3 hours after intravenous injection of radiopharmaceutical. RADIOPHARMACEUTICALS:  Twenty-one mCi Technetium-36mMDP IV COMPARISON:  Multiple exams, including bone scan 09/05/2021 FINDINGS: Degenerative findings noted along the shoulders and left sternoclavicular joint. Expected bilateral renal activity. No significant focal abnormal bony activity to indicate the presence of osseous metastatic disease. IMPRESSION: 1. No findings of osseous metastatic disease. 2. Degenerative findings along the shoulders and left sternoclavicular joint. Electronically Signed   By: WVan ClinesM.D.   On: 01/26/2022 12:41   CT CHEST ABDOMEN PELVIS W CONTRAST  Result Date: 01/26/2022 CLINICAL DATA:  Breast cancer restaging. Prior metastatic disease to the duodenum. * Tracking Code: BO * EXAM: CT CHEST, ABDOMEN, AND PELVIS WITH CONTRAST TECHNIQUE: Multidetector CT imaging of the chest, abdomen and pelvis was performed following the standard protocol during bolus administration of intravenous contrast. RADIATION DOSE REDUCTION: This exam was performed according to the departmental dose-optimization program which includes automated exposure control, adjustment of the mA and/or kV according to patient size and/or use of iterative reconstruction technique. CONTRAST:  856mOMNIPAQUE IOHEXOL 300 MG/ML  SOLN COMPARISON:  Multiple exams, including CT  abdomen 11/01/2021 and CT chest 08/13/2021 FINDINGS: CT CHEST FINDINGS Cardiovascular: Coronary, aortic arch, and branch vessel atherosclerotic vascular disease. Mediastinum/Nodes: Unremarkable Lungs/Pleura: Stable scarring in both lower lobes. Musculoskeletal:  Thoracic spondylosis. Markedly reduced size/conspicuity of the left lower breast mass. CT ABDOMEN PELVIS FINDINGS Hepatobiliary: Percutaneous biliary drain enters via the left hepatic lobe system and extends down into the transverse duodenum. Cholecystectomy. Pancreas: Mild indistinctness of tissue planes along the pancreaticoduodenal region possibly due to low-grade inflammation but technically nonspecific. Dorsal pancreatic duct dilatation up to 0.8 cm in the pancreatic head., similar to mildly increased from prior although more conspicuous due to the presence of IV contrast on today's exam. Spleen: Unremarkable Adrenals/Urinary Tract: Adrenal glands appear normal. Three nonobstructive right renal calculi measure up to 0.3 cm in long axis. Left kidney lower pole nonobstructive calculus 0.8 cm in long axis. Stomach/Bowel: There is potential mild wall thickening in the transverse duodenum in the area of the biliary drain, for example on image 57 series 5, especially superiorly appearance is nonspecific could be from some local inflammation. Gastrojejunostomy noted. Redundant sigmoid colon with sigmoid diverticulosis. Upper normal amount of stool in the colon. No current dilated small bowel. Vascular/Lymphatic: Atherosclerosis is present, including aortoiliac atherosclerotic disease. Small retroperitoneal lymph nodes are present but there is no current pathologic adenopathy observed. Reproductive: Unremarkable Other: Trace free pelvic fluid, nonspecific. Musculoskeletal: Lower lumbar spondylosis and degenerative disc disease. IMPRESSION: 1. Markedly reduced size/conspicuity of the left lower breast mass. 2. Duodenal wall thickening particularly in the  transverse duodenum, with some indistinctness of tissue planes between the duodenum and the adjacent pancreas. 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 A percutaneous left biliary drainage extends through the left biliary system and to the distal transverse duodenum; contrast from the stomach extends through the duodenal lumen to the jejunum, and the patient also has a patent gastrojejunostomy. 3. Other imaging findings of potential clinical significance: Aortic Atherosclerosis (ICD10-I70.0). Coronary atherosclerosis. Stable bilateral lower lobe scarring. Bilateral nonobstructive nephrolithiasis. Sigmoid colon diverticulosis. Trace amount of free pelvic fluid, nonspecific. Electronically Signed   By: Van Clines M.D.   On: 01/26/2022 12:38     Assessment and plan- Patient is a 66 y.o. female with metastatic ER positive HER2 negative breast cancer presenting as duodenal obstruction here for routine follow-up  Metastatic breast cancer: I have reviewed CT chest abdomen and pelvis images independently and discussed findings with the patient which shows stable area of duodenal thickening.Left breast mass on imaging as well as clinical exam has shrunken remarkably.  Plan is to continue letrozole along with Ibrance until progression or toxicity.  Patient is on the lowest dose of Ibrance at 75 mg and although there is room to go up to 100 mg she wants to hold off given her side effects with kisqali  Obstructive jaundice secondary to duodenal obstruction.  Obstructive jaundice has now resolved.  Her drain was internalized. LFTs are normal.    Labs in 6 weeks and I will see her back in 3 months.  Needs a bone density scan prior     Visit Diagnosis 1. Primary malignant neoplasm of breast with metastasis ([place])   2. High risk medication use   3. Use of letrozole (Femara)      Dr. Randa Evens, MD,  [facility] [phone] 02/19/2022 5:27 PM

## 2022-02-25 ENCOUNTER — Other Ambulatory Visit: Payer: Self-pay | Admitting: Oncology

## 2022-03-01 ENCOUNTER — Ambulatory Visit
Admission: RE | Admit: 2022-03-01 | Discharge: 2022-03-01 | Disposition: A | Discharge status: Home / Self Care | Payer: Medicare HMO | Source: Ambulatory Visit | Attending: Interventional Radiology | Admitting: Interventional Radiology

## 2022-03-01 DIAGNOSIS — Z9689 Presence of other specified functional implants: Secondary | ICD-10-CM | POA: Diagnosis not present

## 2022-03-01 DIAGNOSIS — K831 Obstruction of bile duct: Secondary | ICD-10-CM | POA: Diagnosis not present

## 2022-03-01 HISTORY — PX: IR RADIOLOGIST EVAL & MGMT: IMG5224

## 2022-03-01 NOTE — Progress Notes (Signed)
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  She is now status post biliary stent placement (Viabil x2) on 02/08/22.  She states that  she feels like "the most normal human being since the procedure."  She is very happy with having no drain in place.  Feels like living again. The prior drain site has healed up very well. No fevers, chills, jaundice, scleral icterus, nausea/vomiting, or changes in stool character.  Past Medical History:  Diagnosis Date   Anemia    Anxiety    Breast cancer ([place])    Gallstones    GERD (gastroesophageal reflux disease)    Hyperlipidemia    Hypertension    Jaundice 08/29/2021   Rash 08/29/2021   Right upper quadrant abdominal pain 08/29/2021   UTI (urinary tract infection) 08/29/2021    Past Surgical History:  Procedure Laterality Date   BILATERAL SALPINGECTOMY  04/03/1997   CHOLECYSTECTOMY N/A 07/07/2015   Procedure: LAPAROSCOPIC CHOLECYSTECTOMY ;  Surgeon: Jules Husbands, MD;  Location: [facility] ORS;  Service: General;  Laterality: N/A;   ESOPHAGOGASTRODUODENOSCOPY  08/30/2021   Procedure: ESOPHAGOGASTRODUODENOSCOPY (EGD);  Surgeon: Lucilla Lame, MD;  Location: [facility] ENDOSCOPY;  Service: Endoscopy;;   GALLBLADDER SURGERY  07/07/2015   [facility]   IR BILIARY DRAIN PLACEMENT WITH CHOLANGIOGRAM  08/31/2021   IR BILIARY STENT(S) NEW ACCESS WITH DRAIN  02/08/2022   IR CONVERT BILIARY DRAIN TO INT EXT BILIARY DRAIN  09/02/2021   IR EXCHANGE BILIARY DRAIN  10/10/2021   IR EXCHANGE BILIARY DRAIN  12/05/2021   IR EXCHANGE BILIARY DRAIN  01/19/2022    Allergies: Peanuts [peanut oil]  Medications: Prior to Admission medications   Medication Sig Start Date End Date Taking? Authorizing Provider  letrozole [facility]) 2.5 MG tablet Take 1  tablet by mouth once daily 02/27/22   Sindy Guadeloupe, MD  acetaminophen (TYLENOL) 325 MG tablet Take 325 mg by mouth every 6 (six) hours as needed (for pain and sometimes takes 2 if pain is worse).    [provider]  feeding supplement (ENSURE ENLIVE / ENSURE PLUS) LIQD Take 237 mLs by mouth 3 (three) times daily between meals. 09/14/21   Annita Brod, MD  levothyroxine (SYNTHROID) 88 MCG tablet Take 1 tablet (88 mcg total) by mouth daily. 10/20/21   Virginia Crews, MD  Multiple Vitamins-Minerals (CENTRUM VITAMINTS PO) Take 1 tablet by mouth daily.    [provider]  ondansetron (ZOFRAN) 4 MG tablet Take 1 tablet (4 mg total) by mouth daily as needed for nausea or vomiting. 02/14/22 02/14/23  Sindy Guadeloupe, MD  palbociclib [name]) 75 MG tablet Take 1 tablet (75 mg total) by mouth daily. Take for 21 days on, 7 days off, repeat every 28 days. 02/16/22   Darl Pikes, RPH-CPP  pantoprazole (PROTONIX) 40 MG tablet Take 40 mg by mouth daily. 12/29/21   [provider]  potassium chloride SA (KLOR-CON M) 20 MEQ tablet Take 1 tablet (20 mEq total) by mouth daily. 02/14/22   Sindy Guadeloupe, MD  prochlorperazine (COMPAZINE) 5 MG tablet Take 1 tablet (5 mg total) by mouth every 6 (six) hours as needed for refractory nausea / vomiting. 02/14/22   Sindy Guadeloupe, MD  Simethicone (GAS-X PO) Take by mouth. PRN Patient not taking: Reported on 01/18/2022  [provider]  traZODone (DESYREL) 50 MG tablet Take 1 tablet (50 mg total) by mouth at bedtime as needed for sleep. 10/07/21   Hughie Closs, PA-C     Family History  Problem Relation Age of Onset   Hypertension Mother    CVA Mother    Ulcers Mother    Emphysema Mother    Heart disease Father    Prostate cancer Father    Macular degeneration Father    Hypertension Sister    Diabetes Sister    Cervical polyp Sister    Hypertension Sister    Diabetes Sister    Lymphoma Sister     Social History    Socioeconomic History   Marital status: Married    Spouse name: Not on file   Number of children: Not on file   Years of education: Not on file   Highest education level: Not on file  Occupational History   Not on file  Tobacco Use   Smoking status: Never   Smokeless tobacco: Never  Vaping Use   Vaping Use: Never used  Substance and Sexual Activity   Alcohol use: No   Drug use: No   Sexual activity: Yes  Other Topics Concern   Not on file  Social History Narrative   Not on file   Social Determinants of Health   Financial Resource Strain: Low Risk  (11/24/2021)   Overall Financial Resource Strain (CARDIA)    Difficulty of Paying Living Expenses: Not hard at all  Food Insecurity: No Food Insecurity (11/24/2021)   Hunger Vital Sign    Worried About Running Out of Food in the Last Year: Never true    Ran Out of Food in the Last Year: Never true  Transportation Needs: No Transportation Needs (11/24/2021)   PRAPARE - Hydrologist (Medical): No    Lack of Transportation (Non-Medical): No  Physical Activity: Not on file  Stress: No Stress Concern Present (11/24/2021)   [place]    Feeling of Stress : Only a little  Social Connections: Unknown (11/24/2021)   Social Connection and Isolation Panel [NHANES]    Frequency of Communication with Friends and Family: Three times a week    Frequency of Social Gatherings with Friends and Family: Three times a week    Attends Religious Services: Patient refused    Active Member of Clubs or Organizations: Patient refused    Attends Archivist Meetings: Patient refused    Marital Status: Married     Vital Signs: There were no vitals taken for this visit.  No physical examination was performed in lieu of virtual telephone clinic visit.  Imaging: 02/08/22  8 mm x 10 cm and 8 mm x 6 cm Viabil (partially perforated)    Labs:  CBC: Recent Labs    01/04/22 1333 01/18/22 1354 02/08/22 0918 02/14/22 1339  WBC 7.6 8.6 7.8 6.7  HGB 11.4* 13.0 12.9 11.3*  HCT 34.9* 39.2 38.6 34.0*  PLT 311 254 279 195    COAGS: Recent Labs    08/31/21 0859 10/07/21 2246 02/08/22 0918  INR 1.1 1.2 1.3*    BMP: Recent Labs    01/04/22 1333 01/18/22 1354 02/08/22 0918 02/14/22 1339  NA 137 139 143 139  K 3.8 3.8 3.1* 3.3*  CL 111 106 111 97*  CO2 21* 23 24 33*  GLUCOSE 161* 132* 127* 171*  BUN '17 21 19 '$ 17  CALCIUM 8.7* 9.4 9.5 8.7*  CREATININE 1.13* 1.26* 1.17* 1.12*  GFRNONAA 53* 47* 51* 54*    LIVER FUNCTION TESTS: Recent Labs    12/21/21 1440 01/04/22 1333 01/18/22 1354 02/14/22 1339  BILITOT 0.7 0.4 0.5 0.2*  AST 31 43* 58* 27  ALT 54* 58* 79* 36  ALKPHOS 84 90 82 78  PROT 6.6 6.8 7.1 6.1*  ALBUMIN 3.8 3.7 4.2 3.5    Assessment and Plan: 66 year old female with 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  She is now status post biliary stent placement (Viabil x2) on 02/08/22 and recovering very well.  Asymptomatic without evidence of hyperbilirubinemia.    She was instructed on red flag signs/symptoms of recurrent biliary obstruction and will notify our team if these arise.  Follow up with IR as needed.   Electronically Signed: Rosanne Ashing Jaequan Propes 03/01/2022, 10:31 AM   I spent a total of 25 Minutes in virtual telephone clinical consultation, greater than 50% of which was counseling/coordinating care for biliary obstruction.

## 2022-03-16 ENCOUNTER — Inpatient Hospital Stay: Payer: Medicare HMO | Attending: Oncology

## 2022-03-16 DIAGNOSIS — C50919 Malignant neoplasm of unspecified site of unspecified female breast: Secondary | ICD-10-CM | POA: Insufficient documentation

## 2022-03-16 DIAGNOSIS — C784 Secondary malignant neoplasm of small intestine: Secondary | ICD-10-CM | POA: Insufficient documentation

## 2022-03-16 LAB — CBC WITH DIFFERENTIAL/PLATELET
Abs Immature Granulocytes: 0.02 10*3/uL (ref 0.00–0.07)
Basophils Absolute: 0.1 10*3/uL (ref 0.0–0.1)
Basophils Relative: 1 %
Eosinophils Absolute: 0 10*3/uL (ref 0.0–0.5)
Eosinophils Relative: 1 %
HCT: 35.9 % — ABNORMAL LOW (ref 36.0–46.0)
Hemoglobin: 12.5 g/dL (ref 12.0–15.0)
Immature Granulocytes: 0 %
Lymphocytes Relative: 41 %
Lymphs Abs: 2.4 10*3/uL (ref 0.7–4.0)
MCH: 31.3 pg (ref 26.0–34.0)
MCHC: 34.8 g/dL (ref 30.0–36.0)
MCV: 90 fL (ref 80.0–100.0)
Monocytes Absolute: 0.4 10*3/uL (ref 0.1–1.0)
Monocytes Relative: 7 %
Neutro Abs: 3 10*3/uL (ref 1.7–7.7)
Neutrophils Relative %: 50 %
Platelets: 204 10*3/uL (ref 150–400)
RBC: 3.99 MIL/uL (ref 3.87–5.11)
RDW: 12.7 % (ref 11.5–15.5)
WBC: 5.9 10*3/uL (ref 4.0–10.5)
nRBC: 0 % (ref 0.0–0.2)

## 2022-03-16 LAB — COMPREHENSIVE METABOLIC PANEL
ALT: 30 U/L (ref 0–44)
AST: 24 U/L (ref 15–41)
Albumin: 4.2 g/dL (ref 3.5–5.0)
Alkaline Phosphatase: 74 U/L (ref 38–126)
Anion gap: 6 (ref 5–15)
BUN: 21 mg/dL (ref 8–23)
CO2: 29 mmol/L (ref 22–32)
Calcium: 9.3 mg/dL (ref 8.9–10.3)
Chloride: 102 mmol/L (ref 98–111)
Creatinine, Ser: 1.09 mg/dL — ABNORMAL HIGH (ref 0.44–1.00)
GFR, Estimated: 55 mL/min — ABNORMAL LOW (ref >60)
Glucose, Bld: 126 mg/dL — ABNORMAL HIGH (ref 70–99)
Potassium: 3.6 mmol/L (ref 3.5–5.1)
Sodium: 137 mmol/L (ref 135–145)
Total Bilirubin: 0.5 mg/dL (ref 0.3–1.2)
Total Protein: 7.1 g/dL (ref 6.5–8.1)

## 2022-03-21 ENCOUNTER — Ambulatory Visit: Payer: Medicare HMO | Admitting: Surgery

## 2022-03-21 ENCOUNTER — Encounter: Payer: Self-pay | Admitting: Surgery

## 2022-03-21 VITALS — BP 148/85 | HR 76 | Temp 98.0°F | Ht 63.0 in | Wt 133.0 lb

## 2022-03-21 DIAGNOSIS — E039 Hypothyroidism, unspecified: Secondary | ICD-10-CM | POA: Diagnosis not present

## 2022-03-21 DIAGNOSIS — Z09 Encounter for follow-up examination after completed treatment for conditions other than malignant neoplasm: Secondary | ICD-10-CM | POA: Diagnosis not present

## 2022-03-21 DIAGNOSIS — K311 Adult hypertrophic pyloric stenosis: Secondary | ICD-10-CM | POA: Diagnosis not present

## 2022-03-21 NOTE — Patient Instructions (Signed)
   Follow-up with our office as needed.  Please call and ask to speak with a nurse if you develop questions or concerns.  

## 2022-03-21 NOTE — Progress Notes (Signed)
Surgical Clinic Progress/Follow-up Note   HPI:  66 y.o. Female presents to clinic for long-term follow-up.  She is now over 6 months after her robotic assisted gastrojejunostomy, she had had duodenal obstruction from metastatic breast cancer.  She had had an external biliary stent which has been transitioned to an internal biliary stent.   She reports she still needs her PPI.  But avoids certain greasy or fried foods and knows what she can tolerate well.  This seems to keep her nausea well controlled and is maintaining her weight.   She reports she is gradually diminishing her dose and her [name] and continues her follow-up with Dr. Janese Banks. Patient reports  improvement/resolution of prior issues and has been tolerating regular diet with +flatus and normal BM's, denies N/V, fever/chills, CP, or SOB.  Review of Systems:  Constitutional: denies fever/chills  ENT: denies sore throat, hearing problems  Respiratory: denies shortness of breath, wheezing  Cardiovascular: denies chest pain, palpitations  Gastrointestinal: denies abdominal pain, N/V, or diarrhea/and bowel function as per interval history Skin: Denies any other rashes or skin discolorations as per interval history  Vital Signs:  Ht '5\' 3"'$  (1.6 m)   Wt 133 lb (60.3 kg)   BMI 23.56 kg/m    Physical Exam:  Constitutional:  -- Normal body habitus  -- Awake, alert, and oriented x3  Pulmonary:  -- Breathing non-labored at rest Cardiovascular:  -- S1, S2 present  -- No pericardial rubs  Gastrointestinal:  -- Soft and non-distended, non-tender. -- Post-surgical incisions all healed  -- No abdominal masses appreciated, pulsatile or otherwise   Musculoskeletal / Integumentary:  -- Wounds or skin discoloration: None appreciated  -- Extremities: B/L UE and LE FROM, hands and feet warm, no edema     Imaging: No new pertinent imaging available for review   Assessment:  66 y.o. yo Female with a problem list including...  Patient  Active Problem List   Diagnosis Date Noted   Hyperkalemia 11/01/2021   Tumor lysis syndrome 11/01/2021   Severe sepsis ([place]) 10/08/2021   Acute renal failure ([place]) 10/08/2021   Goals of care, counseling/discussion    Palliative care encounter    Hypophosphatemia 09/05/2021   Constipation 09/04/2021   Stenosis of duodenum    Obstructive jaundice 08/29/2021   Primary malignant neoplasm of breast with metastasis ([place]) 08/29/2021   History of cholecystectomy 08/29/2021   Gastric outlet obstruction 08/29/2021   Hypokalemia 08/29/2021   Elevated lipase 08/29/2021   Reflux gastritis 08/04/2021   Diabetes mellitus ([place]) 04/18/2021   Hypertension associated with diabetes ([place]) 04/18/2021   Hyperlipidemia associated with type 2 diabetes mellitus ([place]) 02/19/2015   Abnormal LFTs 02/19/2015   Adult hypothyroidism 02/19/2015    presents to clinic for follow-up evaluation of robotic assisted gastrojejunostomy for obstructing metastatic lesion..  Plan:              - return to clinic as needed, instructed to call office if any questions or concerns  All of the above recommendations were discussed with the patient and all of patient's questions were answered to her expressed satisfaction.  These notes generated with voice recognition software. I apologize for typographical errors.  Ronny Bacon, MD, FACS [place]: [place] for exceptional care. Office: [phone]

## 2022-03-22 LAB — TSH: TSH: 4.26 u[IU]/mL (ref 0.450–4.500)

## 2022-03-23 ENCOUNTER — Other Ambulatory Visit: Payer: Self-pay | Admitting: Oncology

## 2022-03-30 ENCOUNTER — Other Ambulatory Visit (HOSPITAL_COMMUNITY): Payer: Self-pay

## 2022-03-31 ENCOUNTER — Other Ambulatory Visit (HOSPITAL_COMMUNITY): Payer: Self-pay

## 2022-03-31 ENCOUNTER — Telehealth: Payer: Self-pay

## 2022-03-31 NOTE — Telephone Encounter (Signed)
Oral Oncology Patient Advocate Encounter   Received notification that patient is due for re-enrollment for assistance for Ibrance through [employer].   Re-enrollment process has been initiated and will be submitted upon completion of necessary documents. Re-enrollment submitted via online portal.  [employer]'s phone number [phone].   I will continue to follow until final determination.  [name], [facility] Patient [place]  [phone] (phone) [phone] (fax) 03/31/2022 11:14 AM

## 2022-04-06 ENCOUNTER — Other Ambulatory Visit: Payer: Self-pay | Admitting: Family Medicine

## 2022-04-06 DIAGNOSIS — E039 Hypothyroidism, unspecified: Secondary | ICD-10-CM

## 2022-04-19 NOTE — Progress Notes (Unsigned)
I,Deeandra Jerry S Mac Dowdell,acting as a Education administrator for Lavon Paganini, MD.,have documented all relevant documentation on the behalf of Lavon Paganini, MD,as directed by  Lavon Paganini, MD while in the presence of Lavon Paganini, MD.    Annual Wellness Visit     Patient: Diamond Hinton, Female    DOB: [birth year], 66 y.o.   MRN: [id number] Visit Date: 04/20/2022  Today's Provider: Lavon Paganini, MD   Chief Complaint  Patient presents with   Medicare Wellness   Subjective    Diamond Hinton is a 66 y.o. female who presents today for her Annual Wellness Visit. She reports consuming a general diet. Home exercise routine includes walk. She generally feels fairly well. She reports sleeping fairly well. She does have additional problems to discuss today.  Patient declined mammogram.  Patient declined colon cancer screening.  Patient declined flu vaccine.  HPI Patient reports she has restarted taking HCTZ 25 mg.   Patient C/O pain behind left ear and radiating down her neck x 3-4 weeks. Patient reports taking Tylenol for pain, reports good pain control. She is also taking zyrtec for possible allergies. She reports some pressure, feels stopped up. Patient denies any swelling or discharge from ear. Decongestant was not helpful. +neck muscle tenderness and pain with certain movements - has neck tightness and suspect this is muscle spasm  Medications: Outpatient Medications Prior to Visit  Medication Sig   acetaminophen (TYLENOL) 325 MG tablet Take 325 mg by mouth every 6 (six) hours as needed (for pain and sometimes takes 2 if pain is worse).   letrozole (FEMARA) 2.5 MG tablet Take 1 tablet by mouth once daily   levothyroxine (SYNTHROID) 88 MCG tablet Take 1 tablet by mouth once daily   Multiple Vitamins-Minerals (CENTRUM VITAMINTS PO) Take 1 tablet by mouth daily.   ondansetron (ZOFRAN) 4 MG tablet Take 1 tablet (4 mg total) by mouth daily as needed for nausea or vomiting.    palbociclib (IBRANCE) 75 MG tablet Take 1 tablet (75 mg total) by mouth daily. Take for 21 days on, 7 days off, repeat every 28 days.   pantoprazole (PROTONIX) 40 MG tablet Take 40 mg by mouth daily.   prochlorperazine (COMPAZINE) 5 MG tablet Take 1 tablet (5 mg total) by mouth every 6 (six) hours as needed for refractory nausea / vomiting.   Simethicone (GAS-X PO) Take by mouth. PRN   traZODone (DESYREL) 50 MG tablet Take 1 tablet (50 mg total) by mouth at bedtime as needed for sleep.   [DISCONTINUED] hydrochlorothiazide (HYDRODIURIL) 25 MG tablet Take 25 mg by mouth daily.   No facility-administered medications prior to visit.    Allergies  Allergen Reactions   Peanuts [Peanut Oil] Shortness Of Breath and Itching    Patient Care Team: Virginia Crews, MD as PCP - General (Family Medicine) Theodore Demark, RN (Inactive) as Oncology Nurse Navigator  Review of Systems  HENT:  Positive for ear pain.   Musculoskeletal:  Positive for neck pain.  Neurological:  Positive for headaches.  All other systems reviewed and are negative.   Last CBC Lab Results  Component Value Date   WBC 5.9 03/16/2022   HGB 12.5 03/16/2022   HCT 35.9 (L) 03/16/2022   MCV 90.0 03/16/2022   MCH 31.3 03/16/2022   RDW 12.7 03/16/2022   PLT 204 02/40/9735   Last metabolic panel Lab Results  Component Value Date   GLUCOSE 126 (H) 03/16/2022   NA 137 03/16/2022   K 3.6 03/16/2022  CL 102 03/16/2022   CO2 29 03/16/2022   BUN 21 03/16/2022   CREATININE 1.09 (H) 03/16/2022   GFRNONAA 55 (L) 03/16/2022   CALCIUM 9.3 03/16/2022   PHOS 5.5 (H) 11/02/2021   PROT 7.1 03/16/2022   ALBUMIN 4.2 03/16/2022   LABGLOB 3.0 10/18/2021   AGRATIO 1.6 10/18/2021   BILITOT 0.5 03/16/2022   ALKPHOS 74 03/16/2022   AST 24 03/16/2022   ALT 30 03/16/2022   ANIONGAP 6 03/16/2022   Last lipids Lab Results  Component Value Date   CHOL 209 (H) 04/20/2021   HDL 41 04/20/2021   LDLCALC 137 (H) 04/20/2021   TRIG  95 09/12/2021   CHOLHDL 5.5 (H) 10/12/2020   Last hemoglobin A1c Lab Results  Component Value Date   HGBA1C 6.5 (A) 10/18/2021   Last thyroid functions Lab Results  Component Value Date   TSH 4.260 03/21/2022   Last vitamin D No results found for: "25OHVITD2", "25OHVITD3", "VD25OH" Last vitamin B12 and Folate Lab Results  Component Value Date   VITAMINB12 553 12/01/2015        Objective    Vitals: BP 138/82 (BP Location: Left Arm, Patient Position: Sitting, Cuff Size: Large)   Pulse 80   Temp 98 F (36.7 C) (Oral)   Resp 16   Ht '5\' 2"'$  (1.575 m)   Wt 138 lb (62.6 kg)   BMI 25.24 kg/m  BP Readings from Last 3 Encounters:  04/20/22 138/82  03/21/22 (!) 148/85  02/14/22 (!) 191/90   Wt Readings from Last 3 Encounters:  04/20/22 138 lb (62.6 kg)  03/21/22 133 lb (60.3 kg)  02/14/22 139 lb 4.8 oz (63.2 kg)      Physical Exam Vitals reviewed.  Constitutional:      General: She is not in acute distress.    Appearance: Normal appearance. She is well-developed. She is not diaphoretic.  HENT:     Head: Normocephalic and atraumatic.     Right Ear: Tympanic membrane, ear canal and external ear normal.     Left Ear: Tympanic membrane, ear canal and external ear normal.     Nose: Nose normal.     Mouth/Throat:     Mouth: Mucous membranes are moist.     Pharynx: Oropharynx is clear. No oropharyngeal exudate.  Eyes:     General: No scleral icterus.    Conjunctiva/sclera: Conjunctivae normal.     Pupils: Pupils are equal, round, and reactive to light.  Neck:     Thyroid: No thyromegaly.  Cardiovascular:     Rate and Rhythm: Normal rate and regular rhythm.     Pulses: Normal pulses.     Heart sounds: Normal heart sounds. No murmur heard. Pulmonary:     Effort: Pulmonary effort is normal. No respiratory distress.     Breath sounds: Normal breath sounds. No wheezing or rales.  Abdominal:     General: There is no distension.     Palpations: Abdomen is soft.      Tenderness: There is no abdominal tenderness.  Musculoskeletal:        General: No deformity.     Cervical back: Neck supple.     Right lower leg: No edema.     Left lower leg: No edema.  Lymphadenopathy:     Cervical: No cervical adenopathy.  Skin:    General: Skin is warm and dry.     Findings: No rash.  Neurological:     Mental Status: She is alert and oriented to person,  place, and time. Mental status is at baseline.     Gait: Gait normal.  Psychiatric:        Mood and Affect: Mood normal.        Behavior: Behavior normal.        Thought Content: Thought content normal.      Most recent functional status assessment:    04/20/2022    3:47 PM  In your present state of health, do you have any difficulty performing the following activities:  Hearing? 0  Vision? 0  Difficulty concentrating or making decisions? 0  Walking or climbing stairs? 0  Dressing or bathing? 0  Doing errands, shopping? 0   Most recent fall risk assessment:    04/20/2022    3:46 PM  [place] in the past year? 0  Number falls in past yr: 0  Injury with Fall? 0  Risk for fall due to : No Fall Risks  Follow up Falls evaluation completed    Most recent depression screenings:    04/20/2022    3:46 PM 10/18/2021    8:37 AM  PHQ 2/9 Scores  PHQ - 2 Score 0 2  PHQ- 9 Score 3 4   Most recent cognitive screening:    04/20/2022    3:47 PM  6CIT Screen  What Year? 0 points  What month? 0 points  What time? 0 points  Count back from 20 0 points  Months in reverse 0 points  Repeat phrase 0 points  Total Score 0 points   Most recent Audit-C alcohol use screening    04/20/2022    3:47 PM  Alcohol Use Disorder Test (AUDIT)  1. How often do you have a drink containing alcohol? 0  2. How many drinks containing alcohol do you have on a typical day when you are drinking? 0  3. How often do you have six or more drinks on one occasion? 0  AUDIT-C Score 0   A score of 3 or more in women, and  4 or more in men indicates increased risk for alcohol abuse, EXCEPT if all of the points are from question 1   No results found for any visits on 04/20/22.  Assessment & Plan     Annual wellness visit done today including the all of the following: Reviewed patient's Family Medical History Reviewed and updated list of patient's medical providers Assessment of cognitive impairment was done Assessed patient's functional ability Established a written schedule for health screening services Health Risk Assessent Completed and Reviewed  Exercise Activities and Dietary recommendations  Goals      Exercise 150 minutes per week (moderate activity)     [place] Blood Glucose < 180        Immunization History  Administered Date(s) Administered   PFIZER(Purple Top)SARS-COV-2 Vaccination 08/05/2019, 08/26/2019, 04/01/2020   PNEUMOCOCCAL CONJUGATE-20 04/18/2021    Health Maintenance  Topic Date Due   OPHTHALMOLOGY EXAM  Never done   TETANUS/TDAP  Never done   Zoster Vaccines- Shingrix (1 of 2) Never done   COLONOSCOPY (Pts 45-60yr Insurance coverage will need to be confirmed)  Never done   MAMMOGRAM  Never done   DEXA SCAN  Never done   COVID-19 Vaccine (4 - Pfizer risk series) 05/27/2020   HEMOGLOBIN A1C  04/20/2022   INFLUENZA VACCINE  09/10/2022 (Originally 01/10/2022)   FOOT EXAM  04/21/2023   Pneumonia Vaccine 66 Years old  Completed   Hepatitis C Screening  Completed  HPV VACCINES  Aged Out     Discussed health benefits of physical activity, and encouraged her to engage in regular exercise appropriate for her age and condition.    Problem List Items Addressed This Visit       Cardiovascular and Mediastinum   Hypertension associated with diabetes ([place])    Well controlled Continue current medications - hctz 25 mg daily Recheck metabolic panel F/u in 6 months       Relevant Medications   hydrochlorothiazide (HYDRODIURIL) 25 MG tablet     Endocrine   Hyperlipidemia  associated with type 2 diabetes mellitus ([place])    Reviewed last lipid panel Not currently on a statin - on palliative cancer treatment of note Recheck FLP Discussed diet and exercise       Relevant Medications   hydrochlorothiazide (HYDRODIURIL) 25 MG tablet   Other Relevant Orders   Lipid panel   Diabetes mellitus ([place])    Well controlled with last A1c - recheck today Continue diet control UTD on vaccines, eye exam (upcoming), foot exam (completed today) Discussed diet and exercise F/u in 6 months       Relevant Orders   Hemoglobin A1c   Other Visit Diagnoses     Encounter for subsequent annual wellness visit (AWV) in Medicare patient    -  Primary   Relevant Orders   Lipid panel   Hemoglobin A1c   Encounter for annual physical exam       Neck muscle spasm       Relevant Medications   cyclobenzaprine (FLEXERIL) 5 MG tablet        Return in about 6 months (around 10/19/2022) for chronic disease f/u.     I, Lavon Paganini, MD, have reviewed all documentation for this visit. The documentation on 04/20/22 for the exam, diagnosis, procedures, and orders are all accurate and complete.   Bacigalupo, Dionne Bucy, MD, MPH [facility]

## 2022-04-20 ENCOUNTER — Ambulatory Visit (INDEPENDENT_AMBULATORY_CARE_PROVIDER_SITE_OTHER): Payer: Medicare HMO | Admitting: Family Medicine

## 2022-04-20 ENCOUNTER — Encounter: Payer: Self-pay | Admitting: Family Medicine

## 2022-04-20 VITALS — BP 138/82 | HR 80 | Temp 98.0°F | Resp 16 | Ht 62.0 in | Wt 138.0 lb

## 2022-04-20 DIAGNOSIS — Z0000 Encounter for general adult medical examination without abnormal findings: Secondary | ICD-10-CM | POA: Diagnosis not present

## 2022-04-20 DIAGNOSIS — M62838 Other muscle spasm: Secondary | ICD-10-CM

## 2022-04-20 DIAGNOSIS — E1169 Type 2 diabetes mellitus with other specified complication: Secondary | ICD-10-CM | POA: Diagnosis not present

## 2022-04-20 DIAGNOSIS — E1159 Type 2 diabetes mellitus with other circulatory complications: Secondary | ICD-10-CM

## 2022-04-20 DIAGNOSIS — I152 Hypertension secondary to endocrine disorders: Secondary | ICD-10-CM

## 2022-04-20 DIAGNOSIS — E785 Hyperlipidemia, unspecified: Secondary | ICD-10-CM

## 2022-04-20 MED ORDER — CYCLOBENZAPRINE HCL 5 MG PO TABS: 5.0000 mg | ORAL_TABLET | Freq: Three times a day (TID) | ORAL | 1 refills | Status: DC | PRN

## 2022-04-20 MED ORDER — HYDROCHLOROTHIAZIDE 25 MG PO TABS: 25.0000 mg | ORAL_TABLET | Freq: Every day | ORAL | 1 refills | Status: DC

## 2022-04-20 NOTE — Assessment & Plan Note (Signed)
Well controlled with last A1c - recheck today Continue diet control UTD on vaccines, eye exam (upcoming), foot exam (completed today) Discussed diet and exercise F/u in 6 months

## 2022-04-20 NOTE — Assessment & Plan Note (Signed)
Reviewed last lipid panel Not currently on a statin - on palliative cancer treatment of note Recheck FLP Discussed diet and exercise

## 2022-04-20 NOTE — Assessment & Plan Note (Signed)
Well controlled Continue current medications - hctz 25 mg daily Recheck metabolic panel F/u in 6 months

## 2022-04-21 LAB — HEMOGLOBIN A1C
Est. average glucose Bld gHb Est-mCnc: 143 mg/dL
Hgb A1c MFr Bld: 6.6 % — ABNORMAL HIGH (ref 4.8–5.6)

## 2022-04-21 LAB — LIPID PANEL
Chol/HDL Ratio: 5.2 ratio — ABNORMAL HIGH (ref 0.0–4.4)
Cholesterol, Total: 243 mg/dL — ABNORMAL HIGH (ref 100–199)
HDL: 47 mg/dL (ref >39)
LDL Chol Calc (NIH): 145 mg/dL — ABNORMAL HIGH (ref 0–99)
Triglycerides: 278 mg/dL — ABNORMAL HIGH (ref 0–149)
VLDL Cholesterol Cal: 51 mg/dL — ABNORMAL HIGH (ref 5–40)

## 2022-04-25 ENCOUNTER — Telehealth: Payer: Self-pay

## 2022-04-25 ENCOUNTER — Ambulatory Visit (INDEPENDENT_AMBULATORY_CARE_PROVIDER_SITE_OTHER): Payer: Medicare HMO | Admitting: Family Medicine

## 2022-04-25 ENCOUNTER — Other Ambulatory Visit: Payer: Medicare HMO

## 2022-04-25 ENCOUNTER — Ambulatory Visit: Payer: Medicare HMO | Attending: Family Medicine

## 2022-04-25 ENCOUNTER — Ambulatory Visit: Payer: Self-pay | Admitting: *Deleted

## 2022-04-25 ENCOUNTER — Encounter: Payer: Self-pay | Admitting: Family Medicine

## 2022-04-25 VITALS — BP 136/80 | HR 106 | Temp 99.0°F | Resp 16 | Wt 137.8 lb

## 2022-04-25 DIAGNOSIS — R002 Palpitations: Secondary | ICD-10-CM

## 2022-04-25 MED ORDER — BACLOFEN 10 MG PO TABS: 10.0000 mg | ORAL_TABLET | Freq: Three times a day (TID) | ORAL | 0 refills | Status: DC

## 2022-04-25 NOTE — Telephone Encounter (Signed)
Patient had to cancel appointment for today. Will contact me to set up another time to come in and sign paperwork.   [name], [facility] [place]  [phone] (phone) [phone] (fax) 04/25/2022 1:33 PM

## 2022-04-25 NOTE — Telephone Encounter (Signed)
  Chief Complaint: palpitations- patient thinks medication reaction-flexeril  Symptoms: nausea, fast heart rate, elevated glucose Frequency: started last night Pertinent Negatives: Patient denies SOB,chest pain Disposition: '[]'$ ED /'[]'$ Urgent Care (no appt availability in office) / '[x]'$ Appointment(In office/virtual)/ '[]'$  [facility]/ '[]'$ Home Care/ '[]'$ Refused Recommended Disposition /'[]'$ [facility]/ '[]'$  Follow-up with PCP Additional Notes: Patient states her symptoms are calmer now- she is advised ED if she gets worse before appointment

## 2022-04-25 NOTE — Progress Notes (Signed)
I,Sulibeya S Dimas,acting as a scribe for Lavon Paganini, MD.,have documented all relevant documentation on the behalf of Lavon Paganini, MD,as directed by  Lavon Paganini, MD while in the presence of Lavon Paganini, MD.     Established patient visit   Patient: Diamond Hinton   DOB: 07-07-1955   66 y.o. Female  MRN: [id number] Visit Date: 04/25/2022  Today's healthcare provider: Lavon Paganini, MD   Chief Complaint  Patient presents with   Palpitations   Subjective    Palpitations  This is a new problem. The current episode started yesterday. Episode frequency: once last night. The problem has been resolved. On average, each episode lasts 8 hours. Nothing aggravates the symptoms. Associated symptoms include anxiety, a fever, an irregular heartbeat, malaise/fatigue and nausea. Pertinent negatives include no chest fullness, chest pain, coughing, diaphoresis, near-syncope, shortness of breath, syncope, vomiting or weakness. Treatments tried: zyrtec.  patient reports she took flexeril 1 tablet two times yesterday. She thinks medication caused symptoms.  Never had this before, except when blood sugar was up in the past. BG was 266 at 3am when palpitations   Medications: Outpatient Medications Prior to Visit  Medication Sig   acetaminophen (TYLENOL) 325 MG tablet Take 325 mg by mouth every 6 (six) hours as needed (for pain and sometimes takes 2 if pain is worse).   hydrochlorothiazide (HYDRODIURIL) 25 MG tablet Take 1 tablet (25 mg total) by mouth daily.   letrozole (FEMARA) 2.5 MG tablet Take 1 tablet by mouth once daily   levothyroxine (SYNTHROID) 88 MCG tablet Take 1 tablet by mouth once daily   Multiple Vitamins-Minerals (CENTRUM VITAMINTS PO) Take 1 tablet by mouth daily.   ondansetron (ZOFRAN) 4 MG tablet Take 1 tablet (4 mg total) by mouth daily as needed for nausea or vomiting.   palbociclib (IBRANCE) 75 MG tablet Take 1 tablet (75 mg total) by mouth daily. Take  for 21 days on, 7 days off, repeat every 28 days.   pantoprazole (PROTONIX) 40 MG tablet Take 40 mg by mouth daily.   prochlorperazine (COMPAZINE) 5 MG tablet Take 1 tablet (5 mg total) by mouth every 6 (six) hours as needed for refractory nausea / vomiting.   Simethicone (GAS-X PO) Take by mouth. PRN   traZODone (DESYREL) 50 MG tablet Take 1 tablet (50 mg total) by mouth at bedtime as needed for sleep.   [DISCONTINUED] cyclobenzaprine (FLEXERIL) 5 MG tablet Take 1 tablet (5 mg total) by mouth 3 (three) times daily as needed for muscle spasms.   No facility-administered medications prior to visit.    Review of Systems  Constitutional:  Positive for chills, fever and malaise/fatigue. Negative for diaphoresis.  Respiratory:  Positive for chest tightness. Negative for cough and shortness of breath.   Cardiovascular:  Positive for palpitations. Negative for chest pain, syncope and near-syncope.  Gastrointestinal:  Positive for nausea. Negative for vomiting.  Neurological:  Negative for weakness.  Psychiatric/Behavioral:  The patient is nervous/anxious.        Objective    BP 136/80 (BP Location: Left Arm, Patient Position: Sitting, Cuff Size: Normal)   Pulse (!) 106   Temp 99 F (37.2 C) (Oral)   Resp 16   Wt 137 lb 12.8 oz (62.5 kg)   SpO2 96%   BMI 25.20 kg/m  BP Readings from Last 3 Encounters:  04/25/22 136/80  04/20/22 138/82  03/21/22 (!) 148/85   Wt Readings from Last 3 Encounters:  04/25/22 137 lb 12.8 oz (62.5 kg)  04/20/22 138 lb (62.6 kg)  03/21/22 133 lb (60.3 kg)      Physical Exam Vitals reviewed.  Constitutional:      General: She is not in acute distress.    Appearance: Normal appearance. She is well-developed. She is not diaphoretic.  HENT:     Head: Normocephalic and atraumatic.  Eyes:     General: No scleral icterus.    Conjunctiva/sclera: Conjunctivae normal.  Neck:     Thyroid: No thyromegaly.  Cardiovascular:     Rate and Rhythm: Regular  rhythm. Tachycardia present.     Heart sounds: Normal heart sounds. No murmur heard. Pulmonary:     Effort: Pulmonary effort is normal. No respiratory distress.     Breath sounds: Normal breath sounds. No wheezing, rhonchi or rales.  Musculoskeletal:     Cervical back: Neck supple.     Right lower leg: No edema.     Left lower leg: No edema.  Lymphadenopathy:     Cervical: No cervical adenopathy.  Skin:    General: Skin is warm and dry.     Findings: No rash.  Neurological:     Mental Status: She is alert and oriented to person, place, and time. Mental status is at baseline.  Psychiatric:        Mood and Affect: Mood normal.        Behavior: Behavior normal.      EKG: sinus tachycardia   No results found for any visits on 04/25/22.  Assessment & Plan     Problem List Items Addressed This Visit       Other   Palpitations - Primary    New problem Started after taking Flexeril, which is a new medication for her Could be related to Flexeril use, she is not taking other medications that should give her any serotonin syndrome as an adverse reaction from Flexeril though We will switch her Flexeril to baclofen and avoid Flexeril in the future Already had recent labs including TSH, CMP with normal electrolytes She will monitor her blood sugar at home, though I do not think that this is related to her palpitations EKG with sinus tachycardia today Zio patch x14 days Further treatment pending results      Relevant Orders   LONG TERM MONITOR (3-14 DAYS)   EKG 12-Lead     No follow-ups on file.      I, Lavon Paganini, MD, have reviewed all documentation for this visit. The documentation on 04/25/22 for the exam, diagnosis, procedures, and orders are all accurate and complete.   Herndon Grill, Dionne Bucy, MD, MPH [facility]

## 2022-04-25 NOTE — Telephone Encounter (Signed)
[name] (daughter) of patient called to inform Prescott Parma that she cancelled her [employer]. He was to sign papers with her @ [place] and she would not be there. Suezanne Jacquet was notified.

## 2022-04-25 NOTE — Telephone Encounter (Signed)
Reason for Disposition  Age > 60 years  (Exception: Brief heartbeat symptoms that went away and now feels well.)  Answer Assessment - Initial Assessment Questions 1. DESCRIPTION: "Please describe your heart rate or heartbeat that you are having" (e.g., fast/slow, regular/irregular, skipped or extra beats, "palpitations")     Increased pulse rate, nausea, low grade temp, palpitations,glucose 266 2. ONSET: "When did it start?" (Minutes, hours or days)      Last night 3. DURATION: "How long does it last" (e.g., seconds, minutes, hours)     Pulse- dropped but still over 100 4. PATTERN "Does it come and go, or has it been constant since it started?"  "Does it get worse with exertion?"   "Are you feeling it now?"     Continued but better 5. TAP: "Using your hand, can you tap out what you are feeling on a chair or table in front of you, so that I can hear?" (Note: not all patients can do this)         6. HEART RATE: "Can you tell me your heart rate?" "How many beats in 15 seconds?"  (Note: not all patients can do this)       119 7. RECURRENT SYMPTOM: "Have you ever had this before?" If Yes, ask: "When was the last time?" and "What happened that time?"      Years- if glucose is up 8. CAUSE: "What do you think is causing the palpitations?"     Not sure- patient took Flexeril 9. CARDIAC HISTORY: "Do you have any history of heart disease?" (e.g., heart attack, angina, bypass surgery, angioplasty, arrhythmia)      Heart disease 10. OTHER SYMPTOMS: "Do you have any other symptoms?" (e.g., dizziness, chest pain, sweating, difficulty breathing)       Mouth dry 11. PREGNANCY: "Is there any chance you are pregnant?" "When was your last menstrual period?"        na  Protocols used: Heart Rate and Heartbeat Questions-A-AH

## 2022-04-25 NOTE — Assessment & Plan Note (Signed)
New problem Started after taking Flexeril, which is a new medication for her Could be related to Flexeril use, she is not taking other medications that should give her any serotonin syndrome as an adverse reaction from Flexeril though We will switch her Flexeril to baclofen and avoid Flexeril in the future Already had recent labs including TSH, CMP with normal electrolytes She will monitor her blood sugar at home, though I do not think that this is related to her palpitations EKG with sinus tachycardia today Zio patch x14 days Further treatment pending results

## 2022-05-13 DIAGNOSIS — R002 Palpitations: Secondary | ICD-10-CM

## 2022-05-15 DIAGNOSIS — H524 Presbyopia: Secondary | ICD-10-CM | POA: Diagnosis not present

## 2022-05-16 ENCOUNTER — Inpatient Hospital Stay: Payer: Medicare HMO | Attending: Oncology

## 2022-05-16 ENCOUNTER — Inpatient Hospital Stay: Payer: Medicare HMO | Admitting: Oncology

## 2022-05-16 ENCOUNTER — Ambulatory Visit: Payer: Medicare HMO | Admitting: Oncology

## 2022-05-16 ENCOUNTER — Other Ambulatory Visit: Payer: Self-pay | Admitting: Pharmacist

## 2022-05-16 ENCOUNTER — Other Ambulatory Visit: Payer: Medicare HMO

## 2022-05-16 ENCOUNTER — Encounter: Payer: Self-pay | Admitting: Oncology

## 2022-05-16 VITALS — BP 134/84 | HR 78 | Resp 16 | Wt 140.2 lb

## 2022-05-16 DIAGNOSIS — Z79811 Long term (current) use of aromatase inhibitors: Secondary | ICD-10-CM

## 2022-05-16 DIAGNOSIS — Z79899 Other long term (current) drug therapy: Secondary | ICD-10-CM

## 2022-05-16 DIAGNOSIS — C50912 Malignant neoplasm of unspecified site of left female breast: Secondary | ICD-10-CM | POA: Diagnosis not present

## 2022-05-16 DIAGNOSIS — C50919 Malignant neoplasm of unspecified site of unspecified female breast: Secondary | ICD-10-CM | POA: Diagnosis not present

## 2022-05-16 DIAGNOSIS — C784 Secondary malignant neoplasm of small intestine: Secondary | ICD-10-CM | POA: Diagnosis not present

## 2022-05-16 LAB — COMPREHENSIVE METABOLIC PANEL
ALT: 25 U/L (ref 0–44)
AST: 24 U/L (ref 15–41)
Albumin: 4.2 g/dL (ref 3.5–5.0)
Alkaline Phosphatase: 75 U/L (ref 38–126)
Anion gap: 12 (ref 5–15)
BUN: 24 mg/dL — ABNORMAL HIGH (ref 8–23)
CO2: 26 mmol/L (ref 22–32)
Calcium: 9.5 mg/dL (ref 8.9–10.3)
Chloride: 100 mmol/L (ref 98–111)
Creatinine, Ser: 1.31 mg/dL — ABNORMAL HIGH (ref 0.44–1.00)
GFR, Estimated: 44 mL/min — ABNORMAL LOW (ref >60)
Glucose, Bld: 141 mg/dL — ABNORMAL HIGH (ref 70–99)
Potassium: 3.5 mmol/L (ref 3.5–5.1)
Sodium: 138 mmol/L (ref 135–145)
Total Bilirubin: 0.5 mg/dL (ref 0.3–1.2)
Total Protein: 7.4 g/dL (ref 6.5–8.1)

## 2022-05-16 LAB — CBC WITH DIFFERENTIAL/PLATELET
Abs Immature Granulocytes: 0.02 10*3/uL (ref 0.00–0.07)
Basophils Absolute: 0.1 10*3/uL (ref 0.0–0.1)
Basophils Relative: 1 %
Eosinophils Absolute: 0 10*3/uL (ref 0.0–0.5)
Eosinophils Relative: 1 %
HCT: 37.5 % (ref 36.0–46.0)
Hemoglobin: 13.2 g/dL (ref 12.0–15.0)
Immature Granulocytes: 0 %
Lymphocytes Relative: 37 %
Lymphs Abs: 2.7 10*3/uL (ref 0.7–4.0)
MCH: 32 pg (ref 26.0–34.0)
MCHC: 35.2 g/dL (ref 30.0–36.0)
MCV: 90.8 fL (ref 80.0–100.0)
Monocytes Absolute: 0.5 10*3/uL (ref 0.1–1.0)
Monocytes Relative: 7 %
Neutro Abs: 4 10*3/uL (ref 1.7–7.7)
Neutrophils Relative %: 54 %
Platelets: 264 10*3/uL (ref 150–400)
RBC: 4.13 MIL/uL (ref 3.87–5.11)
RDW: 14.1 % (ref 11.5–15.5)
WBC: 7.4 10*3/uL (ref 4.0–10.5)
nRBC: 0 % (ref 0.0–0.2)

## 2022-05-16 MED ORDER — PALBOCICLIB 100 MG PO TABS: 100.0000 mg | ORAL_TABLET | Freq: Every day | ORAL | 3 refills | Status: DC

## 2022-05-17 NOTE — Telephone Encounter (Signed)
Oral Oncology Patient Advocate Encounter   Submitted application for assistance for Ibrnace to [employer].   Application submitted via e-fax to [phone]   [employer]'s phone number [phone].   I will continue to check the status until final determination.   [name], [facility] Patient [place]  [phone] (phone) [phone] (fax) 05/17/2022 8:26 AM

## 2022-05-21 NOTE — Progress Notes (Signed)
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  She was found to have diffuse duodenal wall thickening as well as a palpable mass in the left breast.  Duodenal biopsy as well as left breast biopsy was consistent withER positive greater than 90%, PR weakly +1 to 10% HER2 negative lobular breast cancer.  Patient underwent external biliary drain for the obstructive jaundice and palliative gastrojejunostomy surgery to relieve her gastric outlet obstruction.CT scan otherwise did not show any evidence of distant metastatic disease.   Patient was started on letrozole plus Kisqali in early April 2023.  However patient developed AKI and hyperuricemia requiring hospitalizations.  Thelma Comp was therefore stopped and patient was started on Ibrance 75 mg in May 2023.    Interval history-tolerating Ibrance and letrozole combination well without any significant side effects  ECOG PS- 1 Pain scale- 0   Review of systems- Review of Systems  Constitutional:  Negative for chills, fever, malaise/fatigue and weight loss.  HENT:  Negative for congestion, ear discharge and nosebleeds.   Eyes:  Negative for blurred vision.  Respiratory:  Negative for cough, hemoptysis, sputum production, shortness of breath and wheezing.   Cardiovascular:  Negative for  chest pain, palpitations, orthopnea and claudication.  Gastrointestinal:  Negative for abdominal pain, blood in stool, constipation, diarrhea, heartburn, melena, nausea and vomiting.  Genitourinary:  Negative for dysuria, flank pain, frequency, hematuria and urgency.  Musculoskeletal:  Negative for back pain, joint pain and myalgias.  Skin:  Negative for rash.  Neurological:  Negative for dizziness, tingling, focal weakness, seizures, weakness and headaches.  Endo/Heme/Allergies:  Does not bruise/bleed easily.  Psychiatric/Behavioral:  Negative for depression and suicidal ideas. The patient does not have insomnia.       Allergies  Allergen Reactions   Peanuts [Peanut Oil] Shortness Of Breath and Itching     Past Medical History:  Diagnosis Date   Anemia    Anxiety    Breast cancer ([place])    Gallstones    GERD (gastroesophageal reflux disease)    Hyperlipidemia    Hypertension    Jaundice 08/29/2021   Rash 08/29/2021   Right upper quadrant abdominal pain 08/29/2021   UTI (urinary tract infection) 08/29/2021     Past Surgical History:  Procedure Laterality Date   BILATERAL SALPINGECTOMY  04/03/1997   CHOLECYSTECTOMY N/A 07/07/2015   Procedure: LAPAROSCOPIC CHOLECYSTECTOMY ;  Surgeon: Jules Husbands, MD;  Location: [facility] ORS;  Service: General;  Laterality: N/A;   ESOPHAGOGASTRODUODENOSCOPY  08/30/2021   Procedure: ESOPHAGOGASTRODUODENOSCOPY (EGD);  Surgeon: Lucilla Lame, MD;  Location[facility];  Service: Endoscopy;;   GALLBLADDER SURGERY  07/07/2015   [facility]   IR BILIARY DRAIN PLACEMENT WITH CHOLANGIOGRAM  08/31/2021   IR BILIARY STENT(S) NEW ACCESS WITH DRAIN  02/08/2022   IR CONVERT BILIARY DRAIN TO INT EXT BILIARY DRAIN  09/02/2021   IR EXCHANGE BILIARY DRAIN  10/10/2021   IR  EXCHANGE BILIARY DRAIN  12/05/2021   IR EXCHANGE BILIARY DRAIN  01/19/2022   IR RADIOLOGIST EVAL & MGMT  03/01/2022    Social History   Socioeconomic History   Marital status: Married    Spouse name:  Not on file   Number of children: Not on file   Years of education: Not on file   Highest education level: Not on file  Occupational History   Not on file  Tobacco Use   Smoking status: Never   Smokeless tobacco: Never  Vaping Use   Vaping Use: Never used  Substance and Sexual Activity   Alcohol use: No   Drug use: No   Sexual activity: Yes  Other Topics Concern   Not on file  Social History Narrative   Not on file   Social Determinants of Health   Financial Resource Strain: Low Risk  (11/24/2021)   Overall Financial Resource Strain (CARDIA)    Difficulty of Paying Living Expenses: Not hard at all  Food Insecurity: No Food Insecurity (11/24/2021)   Hunger Vital Sign    Worried About Running Out of Food in the Last Year: Never true    [place] in the Last Year: Never true  Transportation Needs: No Transportation Needs (11/24/2021)   PRAPARE - Hydrologist (Medical): No    Lack of Transportation (Non-Medical): No  Physical Activity: Not on file  Stress: No Stress Concern Present (11/24/2021)   [place]    Feeling of Stress : Only a little  Social Connections: Unknown (11/24/2021)   Social Connection and Isolation Panel [NHANES]    Frequency of Communication with Friends and Family: Three times a week    Frequency of Social Gatherings with Friends and Family: Three times a week    Attends Religious Services: Patient refused    Active Member of Clubs or Organizations: Patient refused    Attends Archivist Meetings: Patient refused    Marital Status: Married  Human resources officer Violence: Not on file    Family History  Problem Relation Age of Onset   Hypertension Mother    CVA Mother    Ulcers Mother    Emphysema Mother    Heart disease Father    Prostate cancer Father    Macular degeneration Father    Hypertension Sister    Diabetes Sister    Cervical polyp  Sister    Hypertension Sister    Diabetes Sister    Lymphoma Sister      Current Outpatient Medications:    acetaminophen (TYLENOL) 325 MG tablet, Take 325 mg by mouth every 6 (six) hours as needed (for pain and sometimes takes 2 if pain is worse)., Disp: , Rfl:    hydrochlorothiazide (HYDRODIURIL) 25 MG tablet, Take 1 tablet (25 mg total) by mouth daily., Disp: 90 tablet, Rfl: 1   letrozole (FEMARA) 2.5 MG tablet, Take 1 tablet by mouth once daily, Disp: 30 tablet, Rfl: 2   levothyroxine (SYNTHROID) 88 MCG tablet, Take 1 tablet by mouth once daily, Disp: 90 tablet, Rfl: 0   Multiple Vitamins-Minerals (CENTRUM VITAMINTS PO), Take 1 tablet by mouth daily., Disp: , Rfl:    pantoprazole (PROTONIX) 40 MG tablet, Take 40 mg by mouth daily., Disp: , Rfl:    baclofen (LIORESAL) 10 MG tablet, Take 1 tablet (10 mg total) by mouth 3 (three) times daily. (Patient not taking: Reported on 05/16/2022), Disp: 30 each, Rfl:  0   ondansetron (ZOFRAN) 4 MG tablet, Take 1 tablet (4 mg total) by mouth daily as needed for nausea or vomiting. (Patient not taking: Reported on 05/16/2022), Disp: 30 tablet, Rfl: 1   palbociclib (IBRANCE) 100 MG tablet, Take 1 tablet (100 mg total) by mouth daily. Take for 21 days on, 7 days off, repeat every 28 days., Disp: 21 tablet, Rfl: 3   prochlorperazine (COMPAZINE) 5 MG tablet, Take 1 tablet (5 mg total) by mouth every 6 (six) hours as needed for refractory nausea / vomiting. (Patient not taking: Reported on 05/16/2022), Disp: 30 tablet, Rfl: 1   Simethicone (GAS-X PO), Take by mouth. PRN (Patient not taking: Reported on 05/16/2022), Disp: , Rfl:    traZODone (DESYREL) 50 MG tablet, Take 1 tablet (50 mg total) by mouth at bedtime as needed for sleep. (Patient not taking: Reported on 05/16/2022), Disp: 30 tablet, Rfl: 0  Physical exam:  Vitals:   05/16/22 1404  BP: 134/84  Pulse: 78  Resp: 16  SpO2: 100%  Weight: 140 lb 3.2 oz (63.6 kg)   Physical Exam Constitutional:       General: She is not in acute distress. Cardiovascular:     Rate and Rhythm: Normal rate and regular rhythm.     Heart sounds: Normal heart sounds.  Pulmonary:     Effort: Pulmonary effort is normal.     Breath sounds: Normal breath sounds.  Abdominal:     General: Bowel sounds are normal.     Palpations: Abdomen is soft.  Skin:    General: Skin is warm and dry.  Neurological:     Mental Status: She is alert and oriented to person, place, and time.         Latest Ref Rng & Units 05/16/2022    1:40 PM  CMP  Glucose 70 - 99 mg/dL 141   BUN 8 - 23 mg/dL 24   Creatinine 0.44 - 1.00 mg/dL 1.31   Sodium 135 - 145 mmol/L 138   Potassium 3.5 - 5.1 mmol/L 3.5   Chloride 98 - 111 mmol/L 100   CO2 22 - 32 mmol/L 26   Calcium 8.9 - 10.3 mg/dL 9.5   Total Protein 6.5 - 8.1 g/dL 7.4   Total Bilirubin 0.3 - 1.2 mg/dL 0.5   Alkaline Phos 38 - 126 U/L 75   AST 15 - 41 U/L 24   ALT 0 - 44 U/L 25       Latest Ref Rng & Units 05/16/2022    1:40 PM  CBC  WBC 4.0 - 10.5 K/uL 7.4   Hemoglobin 12.0 - 15.0 g/dL 13.2   Hematocrit 36.0 - 46.0 % 37.5   Platelets 150 - 400 K/uL 264     No images are attached to the encounter.  No results found.   Assessment and plan- Patient is a 66 y.o. female with metastatic ER/PR positive HER2 negative lobular breast cancer presenting with duodenal obstruction s/p palliative gastrojejunostomy currently on letrozole and Ibrance here for routine follow-up  Patient is tolerating Ibrance and letrozole combination well without any significant side effects.  Denies any abdominal pain nausea vomiting or diarrhea.  I will plan to therefore increase her Ibrance dosing to 100 mg from 75 mg.  There is a mild increase in her kidney functions to creatinine of 1.3.  Patient is agreeable to increasing her fluid intake at home.  LFTs are normal.  I will tentatively see her back in 6 weeks time with  a bone density scan prior along with labs CBC with differential CMP CA  27-29 and CA 15-3  I will plan to get repeat systemic scans sometime around February or March 2024.   Visit Diagnosis 1. 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 High risk medication use   3. Use of letrozole (Femara)      Dr. Randa Evens, MD, MPH [facility] [phone] 05/21/2022 11:15 AM

## 2022-06-06 DIAGNOSIS — R002 Palpitations: Secondary | ICD-10-CM | POA: Diagnosis not present

## 2022-06-06 IMAGING — RF DG UGI W SINGLE CM
13 of 14 series · 14 of 24 positions shown · non-contrast
Comparison: Abdominal radiograph 09/05/2021. Upper GI series
09/05/2021.
COMPARISON: Abdominal radiograph 09/05/2021. Upper GI series
09/05/2021.

Addendum:
CLINICAL DATA: Provided history: Status post bypass
gastrojejunostomy on 09/08/2021.

EXAM:
UPPER GI SERIES WITH KUB
TECHNIQUE: After obtaining a scout radiograph, a problem oriented upper GI
series was performed to assess for leak or obstruction using
water-soluble contrast.
FLUOROSCOPY:
Fluoroscopy Time:  3 minutes, 30 seconds.
Radiation Exposure Index (if provided by the fluoroscopic device):
82.2 mGy
Number of Acquired Spot Images: 4

[Series 13: t abdomen supine · 0.14mm/px · 1 of 1 slices shown]
[im 1/1]
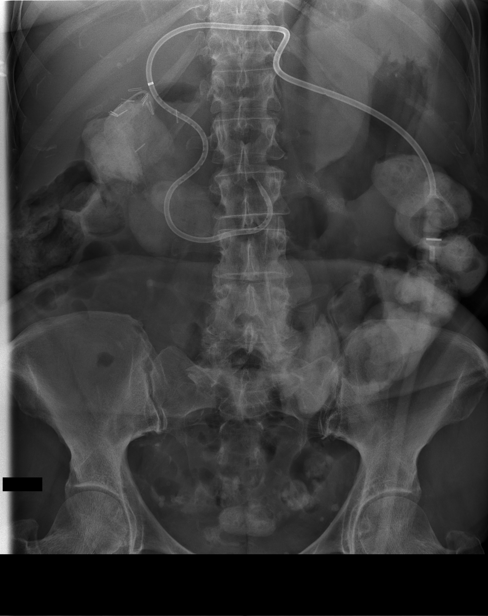

[Series 14: cp_standard · 0.30mm/px · 1 of 22 frames shown (1 of 10)]
[frame 19/22]
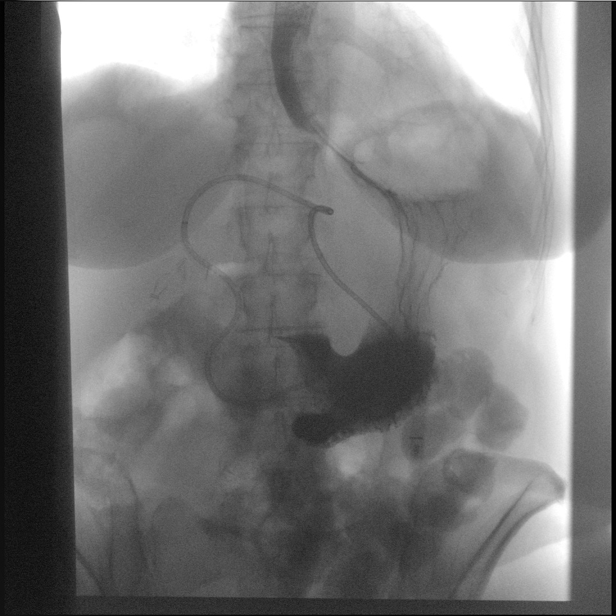

[Series 16: cp_standard · 0.20mm/px · 1 of 132 frames shown (2 of 10)]
[frame 98/132]
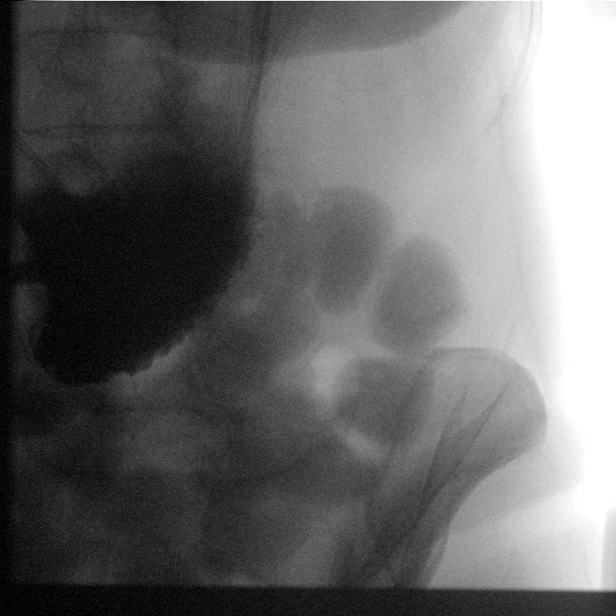

[Series 18: fluoro_barium 2fps_bw · 0.20mm/px · 1 of 1 slices shown (1 of 2)]
[im 1/1]
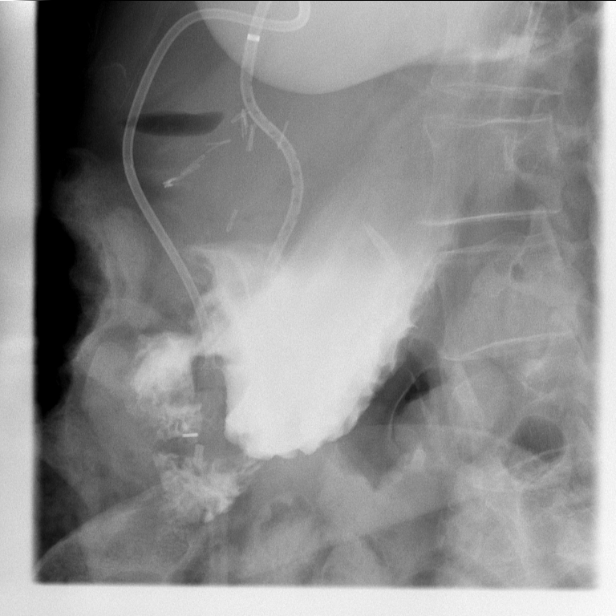

[Series 19: cp_standard · 0.20mm/px · 1 of 135 frames shown (3 of 10)]
[frame 31/135]
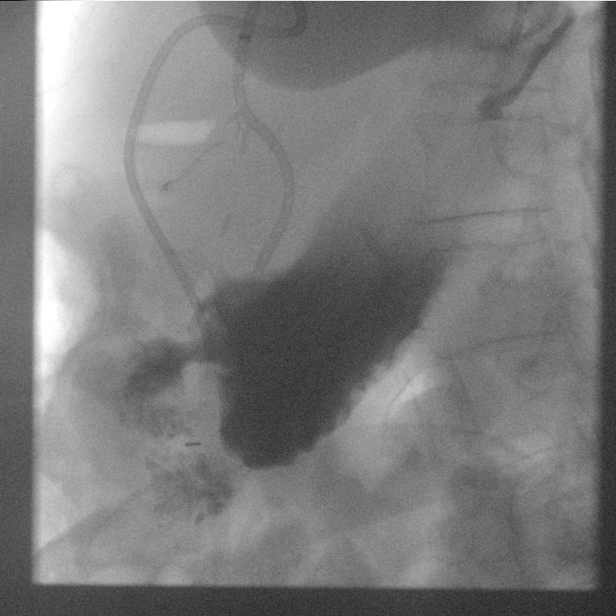

[Series 20: cp_standard · 0.19mm/px · 1 of 71 frames shown (4 of 10)]
[frame 11/71]
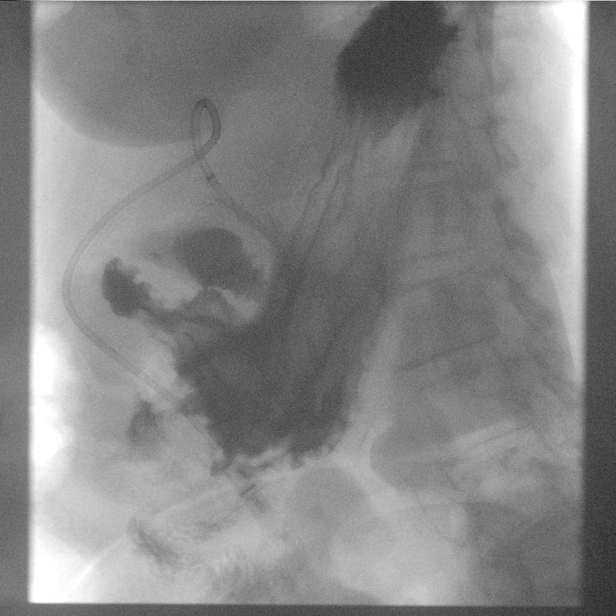

[Series 21: fluoro_barium 2fps_bw · 0.20mm/px · 1 of 2 frames shown (2 of 2)]
[frame 2/2]
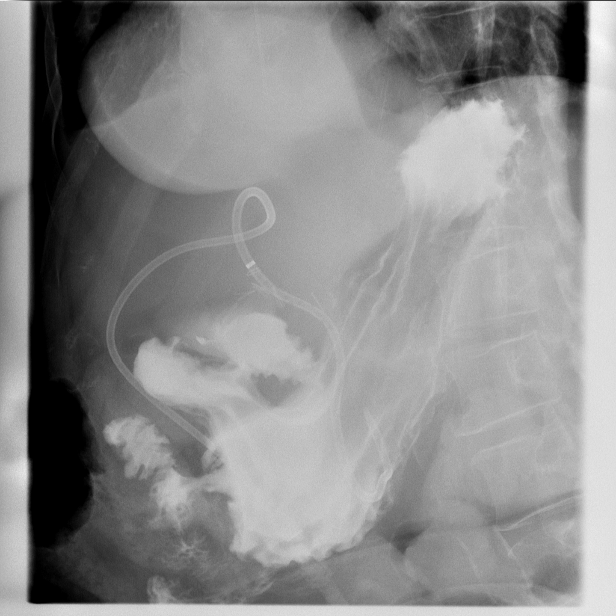

[Series 22: cp_standard · 0.20mm/px · 1 of 93 frames shown (5 of 10)]
[frame 14/93]
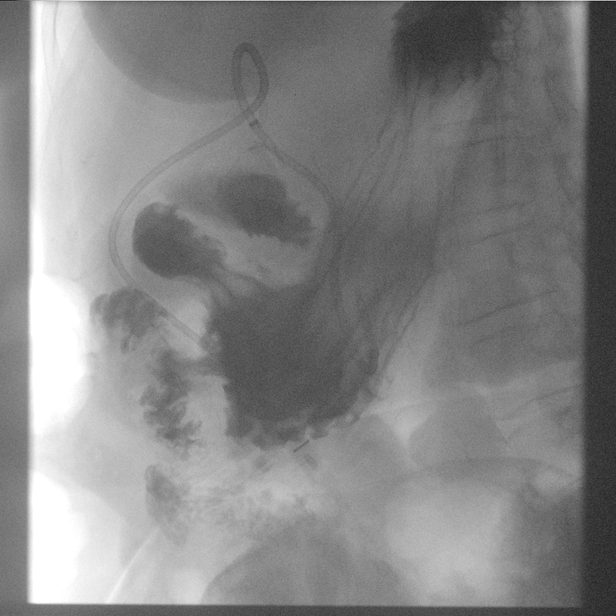

[Series 24: cp_standard · 0.20mm/px · 1 of 128 frames shown (6 of 10)]
[frame 20/128]
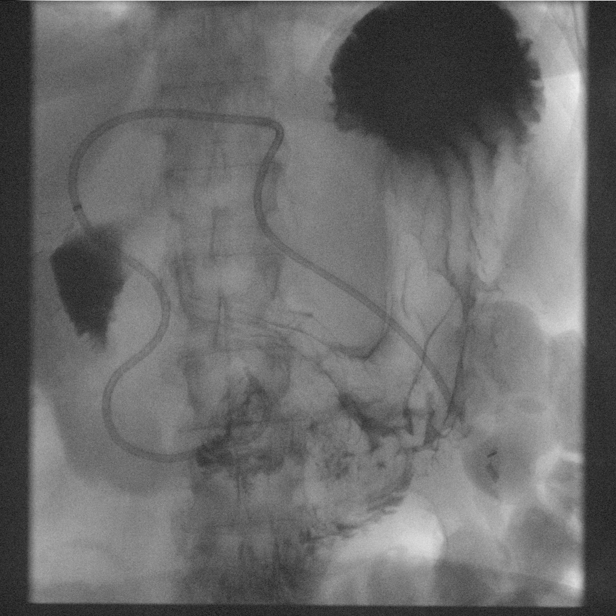

[Series 25: cp_standard · 0.20mm/px · 1 of 98 frames shown (7 of 10)]
[frame 15/98]
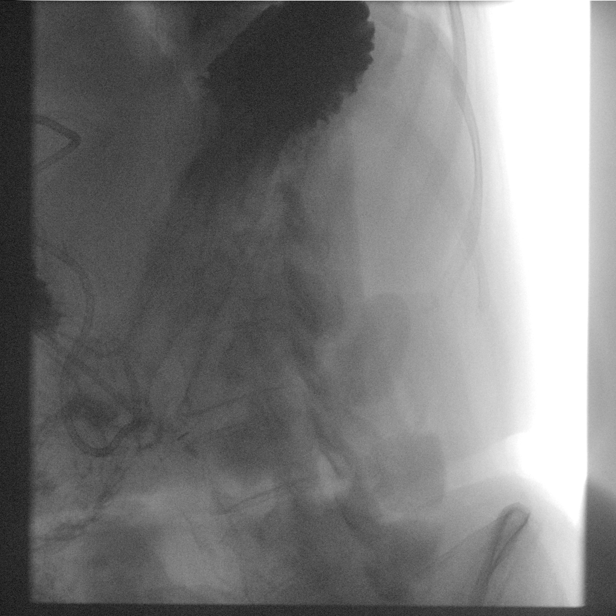

[Series 26: cp_standard · 0.20mm/px · 2 of 21 frames shown (8 of 10)]
[frame 11/21]
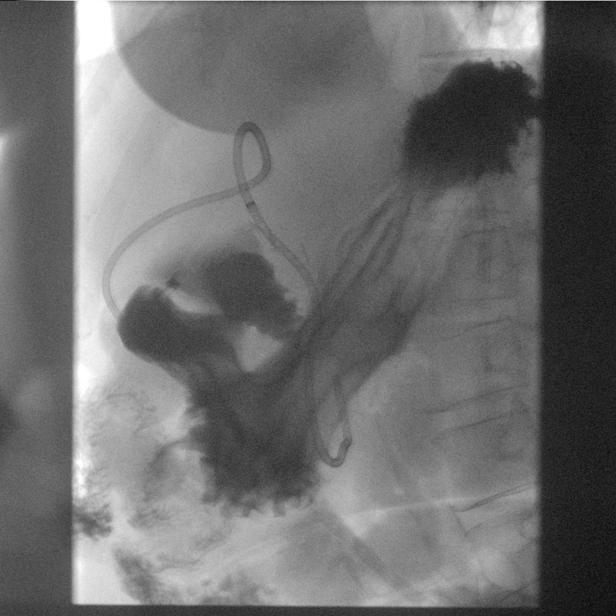
[frame 21/21]
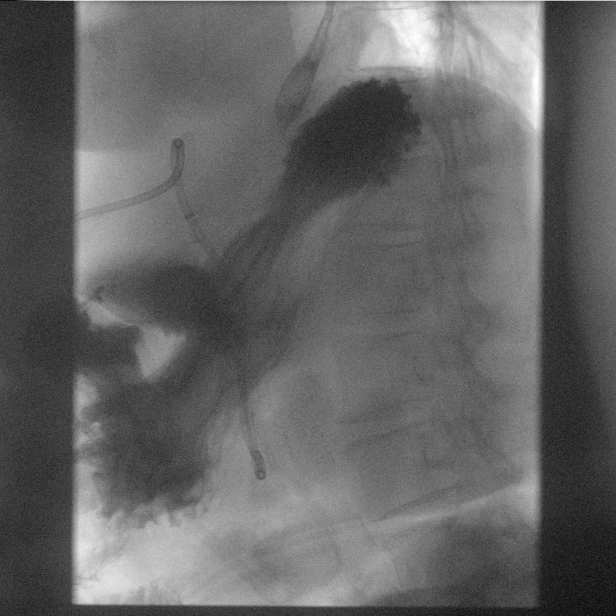

[Series 27: cp_standard · 0.20mm/px · 1 of 159 frames shown (9 of 10)]
[frame 153/159]
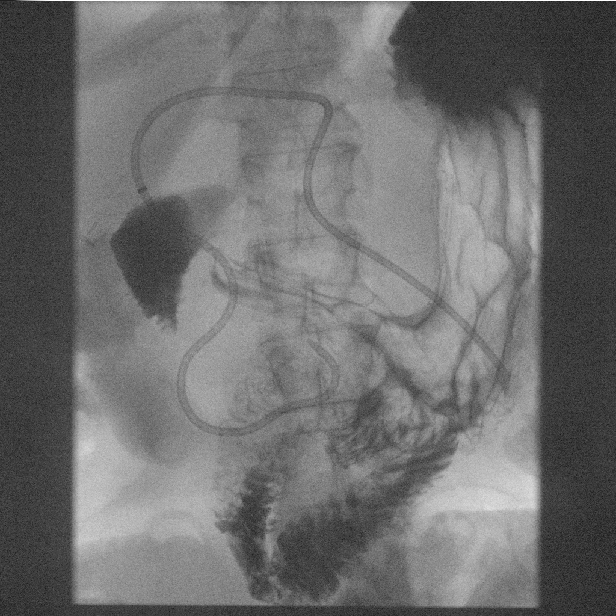

[Series 28: cp_standard · 0.20mm/px · 1 of 85 frames shown (10 of 10)]
[frame 73/85]
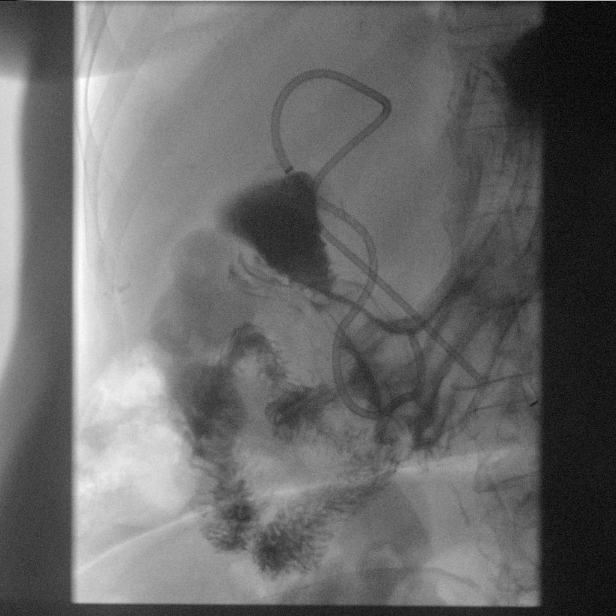

[14 of 24 positions shown; findings below may reference images not displayed]

FINDINGS: A scout radiograph of the abdomen was obtained. Unchanged position
of a gastrostomy tube. No dilated loops of small bowel to suggest
small bowel obstruction. Residual contrast within the colon.

There is prompt passage of contrast from the distal esophagus into
the stomach. Unremarkable appearance of the distal esophagus and GE
junction. Unremarkable appearance of the proximal and mid stomach.
Prompt passage of contrast from the distal stomach into the proximal
jejunum. No evidence of significant obstruction at the
gastrojejunostomy site. No extraluminal contrast is demonstrated to
suggest leak. Contrast also opacifies the proximal duodenum.
IMPRESSION: No evidence of leak status post bypass gastrojejunostomy.

No evidence of obstruction at the gastrojejunostomy site or within
the proximal jejunum.

Contrast opacifies only the proximal duodenum, and the duodenum
likely remains obstructed.

ADDENDUM:
The fluoroscopic imaging was acquired by Sappl, Gertl.

*** End of Addendum ***
FINDINGS: A scout radiograph of the abdomen was obtained. Unchanged position
of a gastrostomy tube. No dilated loops of small bowel to suggest
small bowel obstruction. Residual contrast within the colon.

There is prompt passage of contrast from the distal esophagus into
the stomach. Unremarkable appearance of the distal esophagus and GE
junction. Unremarkable appearance of the proximal and mid stomach.
Prompt passage of contrast from the distal stomach into the proximal
jejunum. No evidence of significant obstruction at the
gastrojejunostomy site. No extraluminal contrast is demonstrated to
suggest leak. Contrast also opacifies the proximal duodenum.
IMPRESSION: No evidence of leak status post bypass gastrojejunostomy.

No evidence of obstruction at the gastrojejunostomy site or within
the proximal jejunum.

Contrast opacifies only the proximal duodenum, and the duodenum
likely remains obstructed.

## 2022-06-16 ENCOUNTER — Other Ambulatory Visit: Payer: Self-pay | Admitting: Oncology

## 2022-06-22 ENCOUNTER — Telehealth: Payer: Self-pay

## 2022-06-22 NOTE — Telephone Encounter (Signed)
Oral Oncology Patient Advocate Encounter  Was successful in securing patient a $15,000 grant from [employer] to provide copayment coverage for [place].  This will keep the out of pocket expense at $0.     [employer] ID: [id number]  I have spoken with the patient.   The billing information is as follows and has been shared with [facility].    RxBin: [id number] PCN: [id number] Member ID: [id number] Group ID: [id number] Dates of Eligibility: 05/23/22 through 05/23/23  Fund:  [place], [facility] Patient [place]  [phone] (phone) [phone] (fax) 06/22/2022 1:44 PM

## 2022-06-22 NOTE — Telephone Encounter (Signed)
Received notification that [name] funding had opened. Successfully secured [place]. Will put a hold on Manufacturer Assistance until needed.   [name], [facility] Patient [place]  [phone] (phone) [phone] (fax) 06/22/2022 2:02 PM

## 2022-06-26 ENCOUNTER — Other Ambulatory Visit (HOSPITAL_COMMUNITY): Payer: Self-pay

## 2022-06-28 ENCOUNTER — Other Ambulatory Visit (HOSPITAL_COMMUNITY): Payer: Self-pay

## 2022-06-30 ENCOUNTER — Other Ambulatory Visit: Payer: Self-pay | Admitting: Family Medicine

## 2022-06-30 DIAGNOSIS — E039 Hypothyroidism, unspecified: Secondary | ICD-10-CM

## 2022-07-04 ENCOUNTER — Ambulatory Visit
Admission: RE | Admit: 2022-07-04 | Discharge: 2022-07-04 | Disposition: A | Discharge status: Home / Self Care | Payer: Medicare HMO | Source: Ambulatory Visit | Attending: Oncology | Admitting: Oncology

## 2022-07-04 DIAGNOSIS — C50919 Malignant neoplasm of unspecified site of unspecified female breast: Secondary | ICD-10-CM | POA: Diagnosis not present

## 2022-07-04 DIAGNOSIS — M8588 Other specified disorders of bone density and structure, other site: Secondary | ICD-10-CM | POA: Diagnosis not present

## 2022-07-04 DIAGNOSIS — Z13820 Encounter for screening for osteoporosis: Secondary | ICD-10-CM | POA: Diagnosis not present

## 2022-07-04 DIAGNOSIS — Z780 Asymptomatic menopausal state: Secondary | ICD-10-CM | POA: Diagnosis not present

## 2022-07-10 ENCOUNTER — Inpatient Hospital Stay: Payer: Medicare HMO | Attending: Oncology

## 2022-07-10 ENCOUNTER — Inpatient Hospital Stay (HOSPITAL_BASED_OUTPATIENT_CLINIC_OR_DEPARTMENT_OTHER): Payer: Medicare HMO | Admitting: Oncology

## 2022-07-10 ENCOUNTER — Encounter: Payer: Self-pay | Admitting: Oncology

## 2022-07-10 VITALS — BP 124/73 | HR 89 | Resp 18 | Wt 139.0 lb

## 2022-07-10 DIAGNOSIS — Z79899 Other long term (current) drug therapy: Secondary | ICD-10-CM | POA: Diagnosis not present

## 2022-07-10 DIAGNOSIS — C50912 Malignant neoplasm of unspecified site of left female breast: Secondary | ICD-10-CM | POA: Diagnosis not present

## 2022-07-10 DIAGNOSIS — R7989 Other specified abnormal findings of blood chemistry: Secondary | ICD-10-CM | POA: Diagnosis not present

## 2022-07-10 DIAGNOSIS — C50919 Malignant neoplasm of unspecified site of unspecified female breast: Secondary | ICD-10-CM | POA: Insufficient documentation

## 2022-07-10 DIAGNOSIS — Z79811 Long term (current) use of aromatase inhibitors: Secondary | ICD-10-CM | POA: Diagnosis not present

## 2022-07-10 DIAGNOSIS — R945 Abnormal results of liver function studies: Secondary | ICD-10-CM | POA: Insufficient documentation

## 2022-07-10 DIAGNOSIS — C784 Secondary malignant neoplasm of small intestine: Secondary | ICD-10-CM

## 2022-07-10 LAB — CBC WITH DIFFERENTIAL/PLATELET
Abs Immature Granulocytes: 0.02 10*3/uL (ref 0.00–0.07)
Basophils Absolute: 0.1 10*3/uL (ref 0.0–0.1)
Basophils Relative: 1 %
Eosinophils Absolute: 0 10*3/uL (ref 0.0–0.5)
Eosinophils Relative: 0 %
HCT: 35.1 % — ABNORMAL LOW (ref 36.0–46.0)
Hemoglobin: 12.5 g/dL (ref 12.0–15.0)
Immature Granulocytes: 0 %
Lymphocytes Relative: 31 %
Lymphs Abs: 1.7 10*3/uL (ref 0.7–4.0)
MCH: 33.6 pg (ref 26.0–34.0)
MCHC: 35.6 g/dL (ref 30.0–36.0)
MCV: 94.4 fL (ref 80.0–100.0)
Monocytes Absolute: 0.5 10*3/uL (ref 0.1–1.0)
Monocytes Relative: 9 %
Neutro Abs: 3.3 10*3/uL (ref 1.7–7.7)
Neutrophils Relative %: 59 %
Platelets: 234 10*3/uL (ref 150–400)
RBC: 3.72 MIL/uL — ABNORMAL LOW (ref 3.87–5.11)
RDW: 14.3 % (ref 11.5–15.5)
WBC: 5.6 10*3/uL (ref 4.0–10.5)
nRBC: 0 % (ref 0.0–0.2)

## 2022-07-10 LAB — COMPREHENSIVE METABOLIC PANEL
ALT: 327 U/L — ABNORMAL HIGH (ref 0–44)
AST: 162 U/L — ABNORMAL HIGH (ref 15–41)
Albumin: 4.3 g/dL (ref 3.5–5.0)
Alkaline Phosphatase: 229 U/L — ABNORMAL HIGH (ref 38–126)
Anion gap: 12 (ref 5–15)
BUN: 22 mg/dL (ref 8–23)
CO2: 29 mmol/L (ref 22–32)
Calcium: 9.5 mg/dL (ref 8.9–10.3)
Chloride: 93 mmol/L — ABNORMAL LOW (ref 98–111)
Creatinine, Ser: 1.17 mg/dL — ABNORMAL HIGH (ref 0.44–1.00)
GFR, Estimated: 51 mL/min — ABNORMAL LOW (ref >60)
Glucose, Bld: 195 mg/dL — ABNORMAL HIGH (ref 70–99)
Potassium: 3.4 mmol/L — ABNORMAL LOW (ref 3.5–5.1)
Sodium: 134 mmol/L — ABNORMAL LOW (ref 135–145)
Total Bilirubin: 0.4 mg/dL (ref 0.3–1.2)
Total Protein: 7.9 g/dL (ref 6.5–8.1)

## 2022-07-11 LAB — CANCER ANTIGEN 27.29: CA 27.29: 28.4 U/mL (ref 0.0–38.6)

## 2022-07-12 ENCOUNTER — Ambulatory Visit
Admission: RE | Admit: 2022-07-12 | Discharge: 2022-07-12 | Disposition: A | Discharge status: Home / Self Care | Payer: Medicare HMO | Source: Ambulatory Visit | Attending: Oncology | Admitting: Oncology

## 2022-07-12 DIAGNOSIS — Z79899 Other long term (current) drug therapy: Secondary | ICD-10-CM | POA: Diagnosis not present

## 2022-07-12 DIAGNOSIS — C50912 Malignant neoplasm of unspecified site of left female breast: Secondary | ICD-10-CM | POA: Diagnosis not present

## 2022-07-12 DIAGNOSIS — I700 Atherosclerosis of aorta: Secondary | ICD-10-CM | POA: Diagnosis not present

## 2022-07-12 DIAGNOSIS — K5730 Diverticulosis of large intestine without perforation or abscess without bleeding: Secondary | ICD-10-CM | POA: Diagnosis not present

## 2022-07-12 DIAGNOSIS — K8689 Other specified diseases of pancreas: Secondary | ICD-10-CM | POA: Diagnosis not present

## 2022-07-12 DIAGNOSIS — C784 Secondary malignant neoplasm of small intestine: Secondary | ICD-10-CM | POA: Insufficient documentation

## 2022-07-12 DIAGNOSIS — K838 Other specified diseases of biliary tract: Secondary | ICD-10-CM | POA: Diagnosis not present

## 2022-07-12 DIAGNOSIS — C50919 Malignant neoplasm of unspecified site of unspecified female breast: Secondary | ICD-10-CM | POA: Diagnosis not present

## 2022-07-12 DIAGNOSIS — J9811 Atelectasis: Secondary | ICD-10-CM | POA: Diagnosis not present

## 2022-07-12 LAB — CANCER ANTIGEN 15-3: CA 15-3: 31.5 U/mL — ABNORMAL HIGH (ref 0.0–25.0)

## 2022-07-12 MED ORDER — IOHEXOL 350 MG/ML SOLN: 75.0000 mL | Freq: Once | INTRAVENOUS | Status: DC | PRN

## 2022-07-12 MED ORDER — IOHEXOL 300 MG/ML  SOLN
100.0000 mL | Freq: Once | INTRAMUSCULAR | Status: AC | PRN
  Administered 2022-07-12: 100 mL via INTRAVENOUS

## 2022-07-12 NOTE — Telephone Encounter (Signed)
Patient is off of medication for the time being, She will call me when Dr. Janese Banks wants her to restart so we can get set everything up for delivery at [facility].   [name], [facility] Patient [place]  [phone] (phone) [phone] (fax) 07/12/2022 1:16 PM

## 2022-07-16 ENCOUNTER — Other Ambulatory Visit: Payer: Self-pay

## 2022-07-16 ENCOUNTER — Emergency Department: Payer: Medicare HMO

## 2022-07-16 ENCOUNTER — Inpatient Hospital Stay
Admission: EM | Admit: 2022-07-16 | Discharge: 2022-07-19 | DRG: 919 | Disposition: A | Discharge status: Home / Self Care | Payer: Medicare HMO | Attending: Internal Medicine | Admitting: Internal Medicine

## 2022-07-16 DIAGNOSIS — E1159 Type 2 diabetes mellitus with other circulatory complications: Secondary | ICD-10-CM | POA: Diagnosis present

## 2022-07-16 DIAGNOSIS — K5730 Diverticulosis of large intestine without perforation or abscess without bleeding: Secondary | ICD-10-CM | POA: Diagnosis present

## 2022-07-16 DIAGNOSIS — Z79811 Long term (current) use of aromatase inhibitors: Secondary | ICD-10-CM

## 2022-07-16 DIAGNOSIS — K831 Obstruction of bile duct: Secondary | ICD-10-CM | POA: Diagnosis present

## 2022-07-16 DIAGNOSIS — Z823 Family history of stroke: Secondary | ICD-10-CM

## 2022-07-16 DIAGNOSIS — R7989 Other specified abnormal findings of blood chemistry: Secondary | ICD-10-CM | POA: Diagnosis present

## 2022-07-16 DIAGNOSIS — C50912 Malignant neoplasm of unspecified site of left female breast: Secondary | ICD-10-CM

## 2022-07-16 DIAGNOSIS — Z91010 Allergy to peanuts: Secondary | ICD-10-CM

## 2022-07-16 DIAGNOSIS — R109 Unspecified abdominal pain: Secondary | ICD-10-CM | POA: Diagnosis not present

## 2022-07-16 DIAGNOSIS — Z833 Family history of diabetes mellitus: Secondary | ICD-10-CM

## 2022-07-16 DIAGNOSIS — I700 Atherosclerosis of aorta: Secondary | ICD-10-CM | POA: Diagnosis not present

## 2022-07-16 DIAGNOSIS — R1011 Right upper quadrant pain: Secondary | ICD-10-CM | POA: Diagnosis not present

## 2022-07-16 DIAGNOSIS — E039 Hypothyroidism, unspecified: Secondary | ICD-10-CM | POA: Diagnosis present

## 2022-07-16 DIAGNOSIS — Z79899 Other long term (current) drug therapy: Secondary | ICD-10-CM

## 2022-07-16 DIAGNOSIS — E876 Hypokalemia: Secondary | ICD-10-CM | POA: Diagnosis present

## 2022-07-16 DIAGNOSIS — Z853 Personal history of malignant neoplasm of breast: Secondary | ICD-10-CM

## 2022-07-16 DIAGNOSIS — Z807 Family history of other malignant neoplasms of lymphoid, hematopoietic and related tissues: Secondary | ICD-10-CM

## 2022-07-16 DIAGNOSIS — K5900 Constipation, unspecified: Secondary | ICD-10-CM | POA: Diagnosis present

## 2022-07-16 DIAGNOSIS — R6510 Systemic inflammatory response syndrome (SIRS) of non-infectious origin without acute organ dysfunction: Secondary | ICD-10-CM | POA: Diagnosis present

## 2022-07-16 DIAGNOSIS — Z8042 Family history of malignant neoplasm of prostate: Secondary | ICD-10-CM

## 2022-07-16 DIAGNOSIS — T85590A Other mechanical complication of bile duct prosthesis, initial encounter: Secondary | ICD-10-CM | POA: Diagnosis not present

## 2022-07-16 DIAGNOSIS — E1165 Type 2 diabetes mellitus with hyperglycemia: Secondary | ICD-10-CM | POA: Diagnosis present

## 2022-07-16 DIAGNOSIS — Z79890 Hormone replacement therapy (postmenopausal): Secondary | ICD-10-CM

## 2022-07-16 DIAGNOSIS — E785 Hyperlipidemia, unspecified: Secondary | ICD-10-CM | POA: Diagnosis present

## 2022-07-16 DIAGNOSIS — I152 Hypertension secondary to endocrine disorders: Secondary | ICD-10-CM | POA: Diagnosis present

## 2022-07-16 DIAGNOSIS — I10 Essential (primary) hypertension: Secondary | ICD-10-CM | POA: Diagnosis present

## 2022-07-16 DIAGNOSIS — Z825 Family history of asthma and other chronic lower respiratory diseases: Secondary | ICD-10-CM

## 2022-07-16 DIAGNOSIS — Y732 Prosthetic and other implants, materials and accessory gastroenterology and urology devices associated with adverse incidents: Secondary | ICD-10-CM | POA: Diagnosis present

## 2022-07-16 DIAGNOSIS — C784 Secondary malignant neoplasm of small intestine: Secondary | ICD-10-CM | POA: Diagnosis present

## 2022-07-16 DIAGNOSIS — C50919 Malignant neoplasm of unspecified site of unspecified female breast: Secondary | ICD-10-CM | POA: Diagnosis present

## 2022-07-16 DIAGNOSIS — K219 Gastro-esophageal reflux disease without esophagitis: Secondary | ICD-10-CM | POA: Diagnosis present

## 2022-07-16 DIAGNOSIS — R945 Abnormal results of liver function studies: Secondary | ICD-10-CM | POA: Diagnosis not present

## 2022-07-16 DIAGNOSIS — R000 Tachycardia, unspecified: Secondary | ICD-10-CM | POA: Diagnosis not present

## 2022-07-16 DIAGNOSIS — Z8249 Family history of ischemic heart disease and other diseases of the circulatory system: Secondary | ICD-10-CM

## 2022-07-16 LAB — URINALYSIS, ROUTINE W REFLEX MICROSCOPIC
Bilirubin Urine: NEGATIVE
Glucose, UA: NEGATIVE mg/dL
Hgb urine dipstick: NEGATIVE
Ketones, ur: NEGATIVE mg/dL
Leukocytes,Ua: NEGATIVE
Nitrite: NEGATIVE
Protein, ur: NEGATIVE mg/dL
Specific Gravity, Urine: 1.013 (ref 1.005–1.030)
pH: 6 (ref 5.0–8.0)

## 2022-07-16 LAB — COMPREHENSIVE METABOLIC PANEL
ALT: 767 U/L — ABNORMAL HIGH (ref 0–44)
AST: 881 U/L — ABNORMAL HIGH (ref 15–41)
Albumin: 4 g/dL (ref 3.5–5.0)
Alkaline Phosphatase: 331 U/L — ABNORMAL HIGH (ref 38–126)
Anion gap: 15 (ref 5–15)
BUN: 20 mg/dL (ref 8–23)
CO2: 22 mmol/L (ref 22–32)
Calcium: 10.2 mg/dL (ref 8.9–10.3)
Chloride: 95 mmol/L — ABNORMAL LOW (ref 98–111)
Creatinine, Ser: 1.01 mg/dL — ABNORMAL HIGH (ref 0.44–1.00)
GFR, Estimated: >60 mL/min (ref >60)
Glucose, Bld: 233 mg/dL — ABNORMAL HIGH (ref 70–99)
Potassium: 3.3 mmol/L — ABNORMAL LOW (ref 3.5–5.1)
Sodium: 132 mmol/L — ABNORMAL LOW (ref 135–145)
Total Bilirubin: 2.7 mg/dL — ABNORMAL HIGH (ref 0.3–1.2)
Total Protein: 7.6 g/dL (ref 6.5–8.1)

## 2022-07-16 LAB — CBC
HCT: 38.3 % (ref 36.0–46.0)
Hemoglobin: 13.3 g/dL (ref 12.0–15.0)
MCH: 32.5 pg (ref 26.0–34.0)
MCHC: 34.7 g/dL (ref 30.0–36.0)
MCV: 93.6 fL (ref 80.0–100.0)
Platelets: 350 10*3/uL (ref 150–400)
RBC: 4.09 MIL/uL (ref 3.87–5.11)
RDW: 13.9 % (ref 11.5–15.5)
WBC: 9.8 10*3/uL (ref 4.0–10.5)
nRBC: 0 % (ref 0.0–0.2)

## 2022-07-16 LAB — DIFFERENTIAL
Abs Immature Granulocytes: 0.08 10*3/uL — ABNORMAL HIGH (ref 0.00–0.07)
Basophils Absolute: 0 10*3/uL (ref 0.0–0.1)
Basophils Relative: 1 %
Eosinophils Absolute: 0.2 10*3/uL (ref 0.0–0.5)
Eosinophils Relative: 2 %
Immature Granulocytes: 1 %
Lymphocytes Relative: 8 %
Lymphs Abs: 0.7 10*3/uL (ref 0.7–4.0)
Monocytes Absolute: 0.4 10*3/uL (ref 0.1–1.0)
Monocytes Relative: 4 %
Neutro Abs: 7.4 10*3/uL (ref 1.7–7.7)
Neutrophils Relative %: 84 %

## 2022-07-16 LAB — LACTIC ACID, PLASMA
Lactic Acid, Venous: 2.1 mmol/L (ref 0.5–1.9)
Lactic Acid, Venous: 1.9 mmol/L (ref 0.5–1.9)

## 2022-07-16 LAB — TROPONIN I (HIGH SENSITIVITY)
Troponin I (High Sensitivity): 6 ng/L (ref <18)
Troponin I (High Sensitivity): 6 ng/L (ref <18)

## 2022-07-16 LAB — LIPASE, BLOOD: Lipase: 45 U/L (ref 11–51)

## 2022-07-16 MED ORDER — ONDANSETRON HCL 4 MG/2ML IJ SOLN
4.0000 mg | Freq: Once | INTRAMUSCULAR | Status: AC
  Administered 2022-07-16: 4 mg via INTRAVENOUS
  Filled 2022-07-16: qty 2

## 2022-07-16 MED ORDER — BISACODYL 10 MG RE SUPP: 10.0000 mg | Freq: Every day | RECTAL | Status: DC | PRN

## 2022-07-16 MED ORDER — SODIUM CHLORIDE 0.9 % IV BOLUS
1000.0000 mL | Freq: Once | INTRAVENOUS | Status: AC
  Administered 2022-07-16: 1000 mL via INTRAVENOUS

## 2022-07-16 MED ORDER — MORPHINE SULFATE (PF) 2 MG/ML IV SOLN
2.0000 mg | INTRAVENOUS | Status: DC | PRN
  Administered 2022-07-17 – 2022-07-19 (×7): 2 mg via INTRAVENOUS
  Filled 2022-07-16 (×7): qty 1

## 2022-07-16 MED ORDER — POTASSIUM CHLORIDE IN NACL 20-0.9 MEQ/L-% IV SOLN
INTRAVENOUS | Status: AC
  Filled 2022-07-16 (×2): qty 1000

## 2022-07-16 MED ORDER — ONDANSETRON HCL 4 MG PO TABS: 4.0000 mg | ORAL_TABLET | Freq: Four times a day (QID) | ORAL | Status: DC | PRN

## 2022-07-16 MED ORDER — ONDANSETRON HCL 4 MG/2ML IJ SOLN
4.0000 mg | Freq: Four times a day (QID) | INTRAMUSCULAR | Status: DC | PRN
  Administered 2022-07-18: 4 mg via INTRAVENOUS
  Filled 2022-07-16: qty 2

## 2022-07-16 MED ORDER — ACETAMINOPHEN 650 MG RE SUPP
650.0000 mg | Freq: Four times a day (QID) | RECTAL | Status: DC | PRN
  Administered 2022-07-17: 650 mg via RECTAL
  Filled 2022-07-16: qty 1

## 2022-07-16 MED ORDER — ACETAMINOPHEN 325 MG PO TABS
650.0000 mg | ORAL_TABLET | Freq: Four times a day (QID) | ORAL | Status: DC | PRN
  Administered 2022-07-17 – 2022-07-19 (×4): 650 mg via ORAL
  Filled 2022-07-16 (×6): qty 2

## 2022-07-16 MED ORDER — MORPHINE SULFATE (PF) 4 MG/ML IV SOLN
4.0000 mg | Freq: Once | INTRAVENOUS | Status: AC
  Administered 2022-07-16: 4 mg via INTRAVENOUS
  Filled 2022-07-16 (×2): qty 1

## 2022-07-16 MED ORDER — HYDRALAZINE HCL 20 MG/ML IJ SOLN: 5.0000 mg | INTRAMUSCULAR | Status: DC | PRN

## 2022-07-16 MED ORDER — INSULIN ASPART 100 UNIT/ML IJ SOLN
0.0000 [IU] | INTRAMUSCULAR | Status: DC
  Administered 2022-07-17: 5 [IU] via SUBCUTANEOUS
  Administered 2022-07-17: 2 [IU] via SUBCUTANEOUS
  Administered 2022-07-17: 3 [IU] via SUBCUTANEOUS
  Administered 2022-07-17: 2 [IU] via SUBCUTANEOUS
  Administered 2022-07-18 (×2): 3 [IU] via SUBCUTANEOUS
  Administered 2022-07-18: 2 [IU] via SUBCUTANEOUS
  Filled 2022-07-16 (×7): qty 1

## 2022-07-16 MED ORDER — IOHEXOL 300 MG/ML  SOLN
100.0000 mL | Freq: Once | INTRAMUSCULAR | Status: AC | PRN
  Administered 2022-07-16: 100 mL via INTRAVENOUS

## 2022-07-16 NOTE — Assessment & Plan Note (Signed)
Blood sugar 233.  Patient not on any hypoglycemic agents Sliding scale coverage

## 2022-07-16 NOTE — ED Provider Notes (Signed)
[facility] Provider Note    Event Date/Time   First MD Initiated Contact with Patient 07/16/22 2010     (approximate)   History   Abdominal Pain   HPI  Diamond Hinton is a 67 y.o. 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  Nausea but no vomiting.  Bowel movements and flatus have been normal.  No GI bleeding.  He has chills but no fever.  No urinary symptoms.  Of note, routine follow-up for her cancer monitoring earlier this week revealed an elevated LFTs as well as a CT scan earlier this week that was concerning for stent malfunction due to dilation of bile ducts seen on imaging.  Independent Historian contributed to assessment above: Daughter at bedside  External Medical Documents Reviewed: Laboratory results as well as a CT scan obtained earlier last week, with findings as above      Physical Exam   Triage Vital Signs: ED Triage Vitals  Enc Vitals Group     BP 07/16/22 1944 (!) 143/83     Pulse Rate 07/16/22 1944 (!) 110     Resp 07/16/22 1944 17     Temp 07/16/22 1944 99.5 F (37.5 C)     Temp Source 07/16/22 1944 Oral     SpO2 07/16/22 1944 95 %     Weight 07/16/22 1941 140 lb (63.5 kg)     Height 07/16/22 1941 '5\' 3"'$  (1.6 m)     Head Circumference --      Peak Flow --      Pain Score 07/16/22 1941 7     Pain Loc --      Pain Edu? --      Excl. in [place]? --     Most recent vital signs: Vitals:   07/16/22 2200 07/16/22 2230  BP: 128/77 138/75  Pulse: (!) 110 92  Resp: 15 13  Temp:    SpO2: 95% 96%    General: Awake, no distress.  CV:  Good peripheral perfusion.  Resp:  Normal effort.  Abd:  No distention.  Other:  Awake alert oriented comfortable appearing nontoxic with a  temperature of 99.5 and mild tachycardia at 104, epigastric/right upper quadrant tenderness to palpation but no rigidity or guarding.  No obvious jaundice or scleral icterus.   ED Results / Procedures / Treatments   Labs (all labs ordered are listed, but only abnormal results are displayed) Labs Reviewed  COMPREHENSIVE METABOLIC PANEL - Abnormal; Notable for the following components:      Result Value   Sodium 132 (*)    Potassium 3.3 (*)    Chloride 95 (*)    Glucose, Bld 233 (*)    Creatinine, Ser 1.01 (*)    AST 881 (*)    ALT 767 (*)    Alkaline Phosphatase 331 (*)    Total Bilirubin 2.7 (*)    All other components within normal limits  URINALYSIS, ROUTINE W REFLEX MICROSCOPIC - Abnormal; Notable for the following components:   Color, Urine YELLOW (*)    APPearance CLEAR (*)    All other components within normal limits  DIFFERENTIAL - Abnormal; Notable for the following components:   Abs Immature Granulocytes 0.08 (*)    All other components within normal limits  LACTIC  ACID, PLASMA - Abnormal; Notable for the following components:   Lactic Acid, Venous 2.1 (*)    All other components within normal limits  CULTURE, BLOOD (ROUTINE X 2)  CULTURE, BLOOD (ROUTINE X 2)  LIPASE, BLOOD  CBC  LACTIC ACID, PLASMA  TROPONIN I (HIGH SENSITIVITY)  TROPONIN I (HIGH SENSITIVITY)     I ordered and reviewed the above labs they are notable for LFTs are elevated with an AST and ALT in the 7-800s and increased total bilirubin at 2.7, these are increased from laboratory results obtained 07/10/2022 with an AST of 162 and an ALT of 300s and a total bilirubin of 0.4 at that time.  However blood cell count is normal at 9.8.  Hemoglobin is baseline at 13.3  EKG  ED ECG REPORT I, Lucillie Garfinkel, the attending physician, personally viewed and interpreted this ECG.   Date: 07/16/2022  EKG Time: 2033  Rate: 101  Rhythm: sinus tachycardia  Axis: nl  Intervals:none  ST&T Change: No acute  ischemic changes, poor r wave progression    RADIOLOGY I independently reviewed and interpreted CT of the abdomen pelvis see no obvious obstructive or infectious patterns   PROCEDURES:  Critical Care performed: No  Procedures   MEDICATIONS ORDERED IN ED: Medications  ondansetron (ZOFRAN) injection 4 mg (4 mg Intravenous Given 07/16/22 2103)  sodium chloride 0.9 % bolus 1,000 mL (0 mLs Intravenous Stopped 07/16/22 2240)  iohexol (OMNIPAQUE) 300 MG/ML solution 100 mL (100 mLs Intravenous Contrast Given 07/16/22 2115)  morphine (PF) 4 MG/ML injection 4 mg (4 mg Intravenous Given 07/16/22 2214)    External physician / consultants:  I spoke with hospitalist for admission and regarding care plan for this patient.   IMPRESSION / MDM / ASSESSMENT AND PLAN / ED COURSE  I reviewed the triage vital signs and the nursing notes.                                Patient's presentation is most consistent with acute presentation with potential threat to life or bodily function.  Differential diagnosis includes, but is not limited to, abdominal infection, stent malfunction or Gratian, cholangitis, choledocholithiasis, sepsis, obstruction, ACS   The patient is on the cardiac monitor to evaluate for evidence of arrhythmia and/or significant heart rate changes.  MDM: With markedly elevated LFTs and bilirubin with no fever elevated white blood cell count or any other signs of infection on imaging.  Most likely due to stent malfunction.  Interventional radiology placed a stent.  Given her pain and worsening liver function testing, will admit for IR consultation and intervention.       FINAL CLINICAL IMPRESSION(S) / ED DIAGNOSES   Final diagnoses:  Right upper quadrant abdominal pain  Elevated liver function tests     Rx / DC Orders   ED Discharge Orders     None       Note:  This document was prepared using Dragon voice recognition software and may include unintentional dictation  errors.    Lucillie Garfinkel, MD 07/16/22 [phone]

## 2022-07-16 NOTE — H&P (Incomplete)
History and Physical    Patient: Diamond Hinton VPX:[id number] DOB: 03/13/1956 DOA: 07/16/2022 DOS: the patient was seen and examined on 07/16/2022 PCP: Virginia Crews, MD  Patient coming from: Home  Chief Complaint:  Chief Complaint  Patient presents with  . Abdominal Pain    HPI: Diamond Hinton is a 67 y.o. 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  Patient had routine labs on 1/29 that showed elevated LFTs with AST 162, ALT 327 and total bili 0.4 and follow up CT abdomen and pelvis on 07/12/2022 was concerning for interval development of intra and extrahepatic biliary ductal dilatation with recommendation for correlation for stent malfunction..  Patient was asymptomatic at the time and an outpatient consultation with IR was scheduled for 2/6 however once symptoms started she became concerned and presented to the ED for evaluation.  As mentioned, she has nausea but has had no vomiting.  Her bowel movements have been normal and without blood and nonmelanotic.  She denies fever or chills.  Denies chest pain or shortness of breath. ED course and data review: Low-grade temp of 99.5 in the ED and tachycardic to 110 with otherwise normal vitals.  WBC normal with lactic acid of 1.9.  LFTs a significant bump with AST 881, ALT 767, alk phos 331 and total bilirubin 2.7.  Blood glucose 233.  Potassium 3.3.  EKG, personally reviewed and interpreted with sinus tachycardia nonischemic CT abdomen and pelvis showed stable dilatation of the biliary tree and pancreatic duct with indwelling metallic biliary stent unchanged as further detailed  below: IMPRESSION: 1. Stable dilatation of the biliary tree and pancreatic duct, with indwelling metallic biliary stent unchanged. While there is minimal gas in the stent lumen and intrahepatic biliary tree suggesting stent patency, underlying stent dysfunction is suspected given persistent increased caliber of the biliary tree. 2. Stable wall thickening along the medial aspect of the proximal duodenum, compatible with given history of metastatic breast cancer. 3. Sigmoid diverticulosis without diverticulitis. 4. Significant retained stool throughout the colon consistent with constipation. 5.  Aortic Atherosclerosis (ICD10-I70.0).  Patient treated with an NS bolus and morphine and Zofran. Hospitalist consulted for admission.   Review of Systems: As mentioned in the history of present illness. All other systems reviewed and are negative.  Past Medical History:  Diagnosis Date  . Anemia   . Anxiety   . Breast cancer ([place])   . Gallstones   . GERD (gastroesophageal reflux disease)   . Hyperlipidemia   . Hypertension   . Jaundice 08/29/2021  . Rash 08/29/2021  . Right upper quadrant abdominal pain 08/29/2021  . UTI (urinary tract infection) 08/29/2021   Past Surgical History:  Procedure Laterality Date  . BILATERAL SALPINGECTOMY  04/03/1997  . CHOLECYSTECTOMY N/A 07/07/2015   Procedure: LAPAROSCOPIC CHOLECYSTECTOMY ;  Surgeon: Jules Husbands, MD;  Location: [facility] ORS;  Service: General;  Laterality: N/A;  . ESOPHAGOGASTRODUODENOSCOPY  08/30/2021   Procedure: ESOPHAGOGASTRODUODENOSCOPY (EGD);  Surgeon: Lucilla Lame, MD;  Location: [facility] ENDOSCOPY;  Service: Endoscopy;;  . GALLBLADDER SURGERY  07/07/2015   [facility]  . IR BILIARY DRAIN PLACEMENT WITH CHOLANGIOGRAM  08/31/2021  . IR BILIARY STENT(S) NEW ACCESS WITH DRAIN  02/08/2022  . IR CONVERT BILIARY DRAIN TO INT EXT BILIARY DRAIN  09/02/2021  . IR EXCHANGE BILIARY DRAIN  10/10/2021  . IR EXCHANGE BILIARY DRAIN  12/05/2021  . IR EXCHANGE  BILIARY DRAIN  01/19/2022  . IR RADIOLOGIST EVAL & MGMT  03/01/2022   Social History:  reports that she has never smoked. She has never used smokeless tobacco. She reports that she does not drink alcohol and does not use drugs.  Allergies  Allergen Reactions  . Peanuts [Peanut Oil] Shortness Of Breath and Itching    Family History  Problem Relation Age of Onset  . Hypertension Mother   . CVA Mother   . Ulcers Mother   . Emphysema Mother   . Heart disease Father   . Prostate cancer Father   . Macular degeneration Father   . Hypertension Sister   . Diabetes Sister   . Cervical polyp Sister   . Hypertension Sister   . Diabetes Sister   . Lymphoma Sister     Prior to Admission medications   Medication Sig Start Date End Date Taking? Authorizing Provider  acetaminophen (TYLENOL) 325 MG tablet Take 325 mg by mouth every 6 (six) hours as needed (for pain and sometimes takes 2 if pain is worse).    [provider]  baclofen (LIORESAL) 10 MG tablet Take 1 tablet (10 mg total) by mouth 3 (three) times daily. Patient not taking: Reported on 07/10/2022 04/25/22   Virginia Crews, MD  hydrochlorothiazide (HYDRODIURIL) 25 MG tablet Take 1 tablet (25 mg total) by mouth daily. 04/20/22   Virginia Crews, MD  letrozole [facility]) 2.5 MG tablet Take 1 tablet (2.5 mg total) by mouth daily. 06/16/22   Sindy Guadeloupe, MD  levothyroxine (SYNTHROID) 88 MCG tablet Take 1 tablet by mouth once daily 06/30/22   Virginia Crews, MD  Multiple Vitamins-Minerals (CENTRUM VITAMINTS PO) Take 1 tablet by mouth daily.    [provider]  ondansetron (ZOFRAN) 4 MG tablet Take 1 tablet (4 mg total) by mouth daily as needed for nausea or vomiting. Patient not taking: Reported on 05/16/2022 02/14/22 02/14/23  Sindy Guadeloupe, MD  palbociclib [name]) 100 MG tablet Take 1 tablet (100 mg total) by mouth daily. Take for 21 days on, 7 days off, repeat every 28 days. 05/16/22   Sindy Guadeloupe, MD   pantoprazole (PROTONIX) 40 MG tablet Take 40 mg by mouth daily. Patient not taking: Reported on 07/10/2022 12/29/21   [provider]  prochlorperazine (COMPAZINE) 5 MG tablet Take 1 tablet (5 mg total) by mouth every 6 (six) hours as needed for refractory nausea / vomiting. Patient not taking: Reported on 07/10/2022 02/14/22   Sindy Guadeloupe, MD  Simethicone (GAS-X PO) Take by mouth. PRN Patient not taking: Reported on 07/10/2022    [provider]  traZODone (DESYREL) 50 MG tablet Take 1 tablet (50 mg total) by mouth at bedtime as needed for sleep. Patient not taking: Reported on 07/10/2022 10/07/21   Vallery Sa    Physical Exam: Vitals:   07/16/22 2015 07/16/22 2030 07/16/22 2200 07/16/22 2230  BP: 127/74 (!) 141/83 128/77 138/75  Pulse: (!) 104 (!) 104 (!) 110 92  Resp: '15 16 15 13  '$ Temp:      TempSrc:      SpO2: 97% 98% 95% 96%  Weight:      Height:       Physical Exam  Labs on Admission: I have personally  reviewed following labs and imaging studies  CBC: Recent Labs  Lab 07/10/22 1440 07/16/22 1949 07/16/22 2056  WBC 5.6 9.8  --   NEUTROABS 3.3  --  7.4  HGB 12.5 13.3  --   HCT 35.1* 38.3  --   MCV 94.4 93.6  --   PLT 234 350  --    Basic Metabolic Panel: Recent Labs  Lab 07/10/22 1440 07/16/22 1949  NA 134* 132*  K 3.4* 3.3*  CL 93* 95*  CO2 29 22  GLUCOSE 195* 233*  BUN 22 20  CREATININE 1.17* 1.01*  CALCIUM 9.5 10.2   GFR: Estimated Creatinine Clearance: 47.8 mL/min (A) (by C-G formula based on SCr of 1.01 mg/dL (H)). Liver Function Tests: Recent Labs  Lab 07/10/22 1440 07/16/22 1949  AST 162* 881*  ALT 327* 767*  ALKPHOS 229* 331*  BILITOT 0.4 2.7*  PROT 7.9 7.6  ALBUMIN 4.3 4.0   Recent Labs  Lab 07/16/22 1949  LIPASE 45   No results for input(s): "AMMONIA" in the last 168 hours. Coagulation Profile: No results for input(s): "INR", "PROTIME" in the last 168 hours. Cardiac Enzymes: No results for input(s):  "CKTOTAL", "CKMB", "CKMBINDEX", "TROPONINI" in the last 168 hours. BNP (last 3 results) No results for input(s): "PROBNP" in the last 8760 hours. HbA1C: No results for input(s): "HGBA1C" in the last 72 hours. CBG: No results for input(s): "GLUCAP" in the last 168 hours. Lipid Profile: No results for input(s): "CHOL", "HDL", "LDLCALC", "TRIG", "CHOLHDL", "LDLDIRECT" in the last 72 hours. Thyroid Function Tests: No results for input(s): "TSH", "T4TOTAL", "FREET4", "T3FREE", "THYROIDAB" in the last 72 hours. Anemia Panel: No results for input(s): "VITAMINB12", "FOLATE", "FERRITIN", "TIBC", "IRON", "RETICCTPCT" in the last 72 hours. Urine analysis:    Component Value Date/Time   COLORURINE YELLOW (A) 07/16/2022 2106   APPEARANCEUR CLEAR (A) 07/16/2022 2106   LABSPEC 1.013 07/16/2022 2106   PHURINE 6.0 07/16/2022 2106   GLUCOSEU NEGATIVE 07/16/2022 2106   HGBUR NEGATIVE 07/16/2022 2106   BILIRUBINUR NEGATIVE 07/16/2022 2106   BILIRUBINUR Negative 08/04/2021 0851   KETONESUR NEGATIVE 07/16/2022 2106   PROTEINUR NEGATIVE 07/16/2022 2106   UROBILINOGEN 0.2 08/04/2021 0851   NITRITE NEGATIVE 07/16/2022 2106   LEUKOCYTESUR NEGATIVE 07/16/2022 2106    Radiological Exams on Admission: CT Abdomen Pelvis W Contrast  Result Date: 07/16/2022 CLINICAL DATA:  Increasing abdominal pain, nausea, history of metastatic breast cancer EXAM: CT ABDOMEN AND PELVIS WITH CONTRAST TECHNIQUE: Multidetector CT imaging of the abdomen and pelvis was performed using the standard protocol following bolus administration of intravenous contrast. RADIATION DOSE REDUCTION: This exam was performed according to the departmental dose-optimization program which includes automated exposure control, adjustment of the mA and/or kV according to patient size and/or use of iterative reconstruction technique. CONTRAST:  137m OMNIPAQUE IOHEXOL 300 MG/ML  SOLN COMPARISON:  07/12/2022 FINDINGS: Lower chest: Hypoventilatory changes at  the lung bases. No acute pleural or parenchymal lung disease. Hepatobiliary: There is persistent intrahepatic and extrahepatic biliary duct dilation, with the upstream common bile duct measuring to 18 mm. A metallic stent is identified extending from the mid common bile duct into the duodenal lumen, not appreciably changed since prior exam. A small amount of gas is seen within the lumen of the stent, as well as within the intrahepatic bile ducts, consistent with stent patency. Otherwise the liver is unremarkable. The gallbladder is surgically absent. Pancreas: Stable pancreatic duct dilation. No acute inflammatory changes. Spleen: Normal in size without focal abnormality. Adrenals/Urinary Tract:  Adrenal glands are unremarkable. Kidneys are normal, without renal calculi, focal lesion, or hydronephrosis. Bladder is unremarkable. Stomach/Bowel: No bowel obstruction or ileus. Normal appendix right lower quadrant. Diverticulosis of the sigmoid colon without diverticulitis. There is moderate retained stool within the colon. Persistent wall thickening along the medial aspect of the proximal duodenum, unchanged. Postsurgical changes from gastrojejunostomy. Vascular/Lymphatic: Aortic atherosclerosis. Stable subcentimeter retroperitoneal lymph nodes. No pathologic adenopathy. Reproductive: Uterus and bilateral adnexa are unremarkable. Other: No free fluid or free intraperitoneal gas. No abdominal wall hernia. Musculoskeletal: There are no acute or destructive bony lesions. Reconstructed images demonstrate no additional findings. IMPRESSION: 1. Stable dilatation of the biliary tree and pancreatic duct, with indwelling metallic biliary stent unchanged. While there is minimal gas in the stent lumen and intrahepatic biliary tree suggesting stent patency, underlying stent dysfunction is suspected given persistent increased caliber of the biliary tree. 2. Stable wall thickening along the medial aspect of the proximal duodenum,  compatible with given history of metastatic breast cancer. 3. Sigmoid diverticulosis without diverticulitis. 4. Significant retained stool throughout the colon consistent with constipation. 5.  Aortic Atherosclerosis (ICD10-I70.0). Electronically Signed   By: Randa Ngo M.D.   On: 07/16/2022 21:37     Data Reviewed: Relevant notes from primary care and specialist visits, past discharge summaries as available in EHR, including Care Everywhere. Prior diagnostic testing as pertinent to current admission diagnoses Updated medications and problem lists for reconciliation ED course, including vitals, labs, imaging, treatment and response to treatment Triage notes, nursing and pharmacy notes and ED provider's notes Notable results as noted in HPI   Assessment and Plan: * Biliary stent obstruction, initial encounter Obstructive jaundice Patient with RUQ pain and nausea, uptrending LFTs CT abdomen and pelvis showing stable dilatation of biliary tree and pancreatic duct compared to CT done on 1/31 Will keep patient n.p.o. IR consulted for stent exchange SCDs for DVT prophylaxis Pain control, IV hydration and IV antiemetics  SIRS (systemic inflammatory response syndrome) (HCC) Possible sepsis Low-grade temp of 99.5 and tachycardic to 110 in the ED but with normal WBC and lactic acid 1.9 Sepsis not suspected at this time, monitor MEWS with low threshold for starting abx Blood cultures and sepsis procalcitonin ordered IV hydration    Uncontrolled type 2 diabetes mellitus with hyperglycemia, without long-term current use of insulin (HCC) Blood sugar 233.  Patient not on any hypoglycemic agents Sliding scale coverage  HTN (hypertension) IV hydralazine as needed while n.p.o.  Hypokalemia Replete and monitor  Primary malignant neoplasm of breast with metastasis ([place]) Breast cancer metastatic to intestine s/p internalize gastrojejunostomy and biliary stent On Ibrance and Femara by her  oncologist, Dr Janese Banks who she last saw on 05/21/2022.  Constipation CT abdomen and pelvis showed significant retained stool throughout the colon consistent with constipation Dulcolax suppositories as needed while n.p.o.  Adult hypothyroidism Will hold levothyroxine for tonight while n.p.o.        DVT prophylaxis: SCD  Consults: IR  Advance Care Planning:   Code Status: Prior   Family Communication: none  Disposition Plan: Back to previous home environment  Severity of Illness: The appropriate patient status for this patient is OBSERVATION. Observation status is judged to be reasonable and necessary in order to provide the required intensity of service to ensure the patient's safety. The patient's presenting symptoms, physical exam findings, and initial radiographic and laboratory data in the context of their medical condition is felt to place them at decreased risk for further clinical deterioration. Furthermore, it is  anticipated that the patient will be medically stable for discharge from the hospital within 2 midnights of admission.   Author: Athena Masse, MD 07/16/2022 11:20 PM  For on call review [email or web address].

## 2022-07-16 NOTE — H&P (Signed)
History and Physical    Patient: Diamond Hinton ATF:[id number] DOB: 01-14-1956 DOA: 07/16/2022 DOS: the patient was seen and examined on 07/16/2022 PCP: Virginia Crews, MD  Patient coming from: Home  Chief Complaint:  Chief Complaint  Patient presents with   Abdominal Pain    HPI: Diamond Hinton is a 67 y.o. 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  Patient had routine labs on 1/29 that showed elevated LFTs with AST 162, ALT 327 and total bili 0.4 and follow up CT abdomen and pelvis on 07/12/2022 was concerning for interval development of intra and extrahepatic biliary ductal dilatation with recommendation for correlation for stent malfunction..  Patient was asymptomatic at the time and an outpatient consultation with IR was scheduled for 2/6 however once symptoms started she became concerned and presented to the ED for evaluation.  As mentioned, she has nausea but has had no vomiting.  Her bowel movements have been normal and without blood and nonmelanotic.  She denies fever or chills.  Denies chest pain or shortness of breath. ED course and data review: Low-grade temp of 99.5 in the ED and tachycardic to 110 with otherwise normal vitals.  WBC normal with lactic acid of 1.9.  LFTs a significant bump with AST 881, ALT 767, alk phos 331 and total bilirubin 2.7.  Blood glucose 233.  Potassium 3.3.  EKG, personally reviewed and interpreted with sinus tachycardia nonischemic CT abdomen and pelvis showed stable dilatation of the biliary tree and pancreatic duct with indwelling metallic biliary stent unchanged as further detailed  below: IMPRESSION: 1. Stable dilatation of the biliary tree and pancreatic duct, with indwelling metallic biliary stent unchanged. While there is minimal gas in the stent lumen and intrahepatic biliary tree suggesting stent patency, underlying stent dysfunction is suspected given persistent increased caliber of the biliary tree. 2. Stable wall thickening along the medial aspect of the proximal duodenum, compatible with given history of metastatic breast cancer. 3. Sigmoid diverticulosis without diverticulitis. 4. Significant retained stool throughout the colon consistent with constipation. 5.  Aortic Atherosclerosis (ICD10-I70.0).  Patient treated with an NS bolus and morphine and Zofran. Hospitalist consulted for admission.   Review of Systems: As mentioned in the history of present illness. All other systems reviewed and are negative.  Past Medical History:  Diagnosis Date   Anemia    Anxiety    Breast cancer ([place])    Gallstones    GERD (gastroesophageal reflux disease)    Hyperlipidemia    Hypertension    Jaundice 08/29/2021   Rash 08/29/2021   Right upper quadrant abdominal pain 08/29/2021   UTI (urinary tract infection) 08/29/2021   Past Surgical History:  Procedure Laterality Date   BILATERAL SALPINGECTOMY  04/03/1997   CHOLECYSTECTOMY N/A 07/07/2015   Procedure: LAPAROSCOPIC CHOLECYSTECTOMY ;  Surgeon: Jules Husbands, MD;  Location: [facility] ORS;  Service: General;  Laterality: N/A;   ESOPHAGOGASTRODUODENOSCOPY  08/30/2021   Procedure: ESOPHAGOGASTRODUODENOSCOPY (EGD);  Surgeon: Lucilla Lame, MD;  Location: [facility] ENDOSCOPY;  Service: Endoscopy;;   GALLBLADDER SURGERY  07/07/2015   [facility]   IR BILIARY DRAIN PLACEMENT WITH CHOLANGIOGRAM  08/31/2021   IR BILIARY STENT(S) NEW ACCESS WITH DRAIN  02/08/2022   IR CONVERT BILIARY DRAIN TO INT EXT BILIARY DRAIN  09/02/2021   IR EXCHANGE BILIARY DRAIN  10/10/2021   IR EXCHANGE BILIARY DRAIN  12/05/2021   IR EXCHANGE BILIARY DRAIN  01/19/2022    IR RADIOLOGIST EVAL & MGMT  03/01/2022   Social History:  reports that she has never smoked. She has never used smokeless tobacco. She reports that she does not drink alcohol and does not use drugs.  Allergies  Allergen Reactions   Peanuts [Peanut Oil] Shortness Of Breath and Itching    Family History  Problem Relation Age of Onset   Hypertension Mother    CVA Mother    Ulcers Mother    Emphysema Mother    Heart disease Father    Prostate cancer Father    Macular degeneration Father    Hypertension Sister    Diabetes Sister    Cervical polyp Sister    Hypertension Sister    Diabetes Sister    Lymphoma Sister     Prior to Admission medications   Medication Sig Start Date End Date Taking? Authorizing Provider  acetaminophen (TYLENOL) 325 MG tablet Take 325 mg by mouth every 6 (six) hours as needed (for pain and sometimes takes 2 if pain is worse).    [provider]  baclofen (LIORESAL) 10 MG tablet Take 1 tablet (10 mg total) by mouth 3 (three) times daily. Patient not taking: Reported on 07/10/2022 04/25/22   Virginia Crews, MD  hydrochlorothiazide (HYDRODIURIL) 25 MG tablet Take 1 tablet (25 mg total) by mouth daily. 04/20/22   Virginia Crews, MD  letrozole [facility]) 2.5 MG tablet Take 1 tablet (2.5 mg total) by mouth daily. 06/16/22   Sindy Guadeloupe, MD  levothyroxine (SYNTHROID) 88 MCG tablet Take 1 tablet by mouth once daily 06/30/22   Virginia Crews, MD  Multiple Vitamins-Minerals (CENTRUM VITAMINTS PO) Take 1 tablet by mouth daily.    [provider]  ondansetron (ZOFRAN) 4 MG tablet Take 1 tablet (4 mg total) by mouth daily as needed for nausea or vomiting. Patient not taking: Reported on 05/16/2022 02/14/22 02/14/23  Sindy Guadeloupe, MD  palbociclib [name]) 100 MG tablet Take 1 tablet (100 mg total) by mouth daily. Take for 21 days on, 7 days off, repeat every 28 days. 05/16/22   Sindy Guadeloupe, MD  pantoprazole (PROTONIX) 40 MG tablet Take 40  mg by mouth daily. Patient not taking: Reported on 07/10/2022 12/29/21   [provider]  prochlorperazine (COMPAZINE) 5 MG tablet Take 1 tablet (5 mg total) by mouth every 6 (six) hours as needed for refractory nausea / vomiting. Patient not taking: Reported on 07/10/2022 02/14/22   Sindy Guadeloupe, MD  Simethicone (GAS-X PO) Take by mouth. PRN Patient not taking: Reported on 07/10/2022    [provider]  traZODone (DESYREL) 50 MG tablet Take 1 tablet (50 mg total) by mouth at bedtime as needed for sleep. Patient not taking: Reported on 07/10/2022 10/07/21   Vallery Sa    Physical Exam: Vitals:   07/16/22 2015 07/16/22 2030 07/16/22 2200 07/16/22 2230  BP: 127/74 (!) 141/83 128/77 138/75  Pulse: (!) 104 (!) 104 (!) 110 92  Resp: '15 16 15 13  '$ Temp:      TempSrc:      SpO2: 97% 98% 95% 96%  Weight:      Height:       Physical Exam Vitals and nursing note reviewed.  Constitutional:  General: She is not in acute distress. HENT:     Head: Normocephalic and atraumatic.  Cardiovascular:     Rate and Rhythm: Normal rate and regular rhythm.     Heart sounds: Normal heart sounds.  Pulmonary:     Effort: Pulmonary effort is normal.     Breath sounds: Normal breath sounds.  Abdominal:     Palpations: Abdomen is soft.     Tenderness: There is no abdominal tenderness.  Neurological:     Mental Status: Mental status is at baseline.     Labs on Admission: I have personally reviewed following labs and imaging studies  CBC: Recent Labs  Lab 07/10/22 1440 07/16/22 1949 07/16/22 2056  WBC 5.6 9.8  --   NEUTROABS 3.3  --  7.4  HGB 12.5 13.3  --   HCT 35.1* 38.3  --   MCV 94.4 93.6  --   PLT 234 350  --    Basic Metabolic Panel: Recent Labs  Lab 07/10/22 1440 07/16/22 1949  NA 134* 132*  K 3.4* 3.3*  CL 93* 95*  CO2 29 22  GLUCOSE 195* 233*  BUN 22 20  CREATININE 1.17* 1.01*  CALCIUM 9.5 10.2   GFR: Estimated Creatinine Clearance: 47.8  mL/min (A) (by C-G formula based on SCr of 1.01 mg/dL (H)). Liver Function Tests: Recent Labs  Lab 07/10/22 1440 07/16/22 1949  AST 162* 881*  ALT 327* 767*  ALKPHOS 229* 331*  BILITOT 0.4 2.7*  PROT 7.9 7.6  ALBUMIN 4.3 4.0   Recent Labs  Lab 07/16/22 1949  LIPASE 45   No results for input(s): "AMMONIA" in the last 168 hours. Coagulation Profile: No results for input(s): "INR", "PROTIME" in the last 168 hours. Cardiac Enzymes: No results for input(s): "CKTOTAL", "CKMB", "CKMBINDEX", "TROPONINI" in the last 168 hours. BNP (last 3 results) No results for input(s): "PROBNP" in the last 8760 hours. HbA1C: No results for input(s): "HGBA1C" in the last 72 hours. CBG: No results for input(s): "GLUCAP" in the last 168 hours. Lipid Profile: No results for input(s): "CHOL", "HDL", "LDLCALC", "TRIG", "CHOLHDL", "LDLDIRECT" in the last 72 hours. Thyroid Function Tests: No results for input(s): "TSH", "T4TOTAL", "FREET4", "T3FREE", "THYROIDAB" in the last 72 hours. Anemia Panel: No results for input(s): "VITAMINB12", "FOLATE", "FERRITIN", "TIBC", "IRON", "RETICCTPCT" in the last 72 hours. Urine analysis:    Component Value Date/Time   COLORURINE YELLOW (A) 07/16/2022 2106   APPEARANCEUR CLEAR (A) 07/16/2022 2106   LABSPEC 1.013 07/16/2022 2106   PHURINE 6.0 07/16/2022 2106   GLUCOSEU NEGATIVE 07/16/2022 2106   HGBUR NEGATIVE 07/16/2022 2106   BILIRUBINUR NEGATIVE 07/16/2022 2106   BILIRUBINUR Negative 08/04/2021 0851   KETONESUR NEGATIVE 07/16/2022 2106   PROTEINUR NEGATIVE 07/16/2022 2106   UROBILINOGEN 0.2 08/04/2021 0851   NITRITE NEGATIVE 07/16/2022 2106   LEUKOCYTESUR NEGATIVE 07/16/2022 2106    Radiological Exams on Admission: CT Abdomen Pelvis W Contrast  Result Date: 07/16/2022 CLINICAL DATA:  Increasing abdominal pain, nausea, history of metastatic breast cancer EXAM: CT ABDOMEN AND PELVIS WITH CONTRAST TECHNIQUE: Multidetector CT imaging of the abdomen and pelvis  was performed using the standard protocol following bolus administration of intravenous contrast. RADIATION DOSE REDUCTION: This exam was performed according to the departmental dose-optimization program which includes automated exposure control, adjustment of the mA and/or kV according to patient size and/or use of iterative reconstruction technique. CONTRAST:  146m OMNIPAQUE IOHEXOL 300 MG/ML  SOLN COMPARISON:  07/12/2022 FINDINGS: Lower chest: Hypoventilatory changes at the lung bases.  No acute pleural or parenchymal lung disease. Hepatobiliary: There is persistent intrahepatic and extrahepatic biliary duct dilation, with the upstream common bile duct measuring to 18 mm. A metallic stent is identified extending from the mid common bile duct into the duodenal lumen, not appreciably changed since prior exam. A small amount of gas is seen within the lumen of the stent, as well as within the intrahepatic bile ducts, consistent with stent patency. Otherwise the liver is unremarkable. The gallbladder is surgically absent. Pancreas: Stable pancreatic duct dilation. No acute inflammatory changes. Spleen: Normal in size without focal abnormality. Adrenals/Urinary Tract: Adrenal glands are unremarkable. Kidneys are normal, without renal calculi, focal lesion, or hydronephrosis. Bladder is unremarkable. Stomach/Bowel: No bowel obstruction or ileus. Normal appendix right lower quadrant. Diverticulosis of the sigmoid colon without diverticulitis. There is moderate retained stool within the colon. Persistent wall thickening along the medial aspect of the proximal duodenum, unchanged. Postsurgical changes from gastrojejunostomy. Vascular/Lymphatic: Aortic atherosclerosis. Stable subcentimeter retroperitoneal lymph nodes. No pathologic adenopathy. Reproductive: Uterus and bilateral adnexa are unremarkable. Other: No free fluid or free intraperitoneal gas. No abdominal wall hernia. Musculoskeletal: There are no acute or  destructive bony lesions. Reconstructed images demonstrate no additional findings. IMPRESSION: 1. Stable dilatation of the biliary tree and pancreatic duct, with indwelling metallic biliary stent unchanged. While there is minimal gas in the stent lumen and intrahepatic biliary tree suggesting stent patency, underlying stent dysfunction is suspected given persistent increased caliber of the biliary tree. 2. Stable wall thickening along the medial aspect of the proximal duodenum, compatible with given history of metastatic breast cancer. 3. Sigmoid diverticulosis without diverticulitis. 4. Significant retained stool throughout the colon consistent with constipation. 5.  Aortic Atherosclerosis (ICD10-I70.0). Electronically Signed   By: Randa Ngo M.D.   On: 07/16/2022 21:37     Data Reviewed: Relevant notes from primary care and specialist visits, past discharge summaries as available in EHR, including Care Everywhere. Prior diagnostic testing as pertinent to current admission diagnoses Updated medications and problem lists for reconciliation ED course, including vitals, labs, imaging, treatment and response to treatment Triage notes, nursing and pharmacy notes and ED provider's notes Notable results as noted in HPI   Assessment and Plan: * Biliary stent obstruction, initial encounter Obstructive jaundice Patient with RUQ pain and nausea, uptrending LFTs CT abdomen and pelvis showing stable dilatation of biliary tree and pancreatic duct compared to CT done on 1/31 Will keep patient n.p.o. IR consulted for stent exchange SCDs for DVT prophylaxis Pain control, IV hydration and IV antiemetics  SIRS (systemic inflammatory response syndrome) (HCC) Possible sepsis Low-grade temp of 99.5 and tachycardic to 110 in the ED but with normal WBC and lactic acid 1.9 Sepsis not suspected at this time, monitor MEWS with low threshold for starting abx Blood cultures and sepsis procalcitonin ordered IV  hydration    Uncontrolled type 2 diabetes mellitus with hyperglycemia, without long-term current use of insulin (HCC) Blood sugar 233.  Patient not on any hypoglycemic agents Sliding scale coverage  HTN (hypertension) IV hydralazine as needed while n.p.o.  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  Constipation CT abdomen and pelvis showed significant retained stool throughout the colon consistent with constipation Dulcolax suppositories as needed while n.p.o.  Adult hypothyroidism Will hold levothyroxine for tonight while n.p.o.  DVT prophylaxis: SCD  Consults: IR  Advance Care Planning:   Code Status: Prior   Family Communication: daughter at bedside  Disposition Plan: Back to previous home environment  Severity of Illness: The appropriate patient status for this patient is OBSERVATION. Observation status is judged to be reasonable and necessary in order to provide the required intensity of service to ensure the patient's safety. The patient's presenting symptoms, physical exam findings, and initial radiographic and laboratory data in the context of their medical condition is felt to place them at decreased risk for further clinical deterioration. Furthermore, it is anticipated that the patient will be medically stable for discharge from the hospital within 2 midnights of admission.   Author: Athena Masse, MD 07/16/2022 11:20 PM  For on call review [email or web address].

## 2022-07-16 NOTE — ED Triage Notes (Signed)
Pt arrives via POV with CC of RUQ pain that began this am and has continued to increase in intensity. Reports nausea without vomiting - took compazine at 1830. Pt reports cancer pt and had biliary duct stent placed in August of 2023 - reports liver enzymes were high last week and is concerned that there could be an issue with the stent. Denies any fevers at home.

## 2022-07-16 NOTE — Assessment & Plan Note (Signed)
CT abdomen and pelvis showed significant retained stool throughout the colon consistent with constipation Dulcolax suppositories as needed while n.p.o.

## 2022-07-16 NOTE — Assessment & Plan Note (Signed)
IV hydralazine as needed while n.p.o.

## 2022-07-16 NOTE — Assessment & Plan Note (Addendum)
Possible sepsis Low-grade temp of 99.5 and tachycardic to 110 in the ED but with normal WBC and lactic acid 1.9.  Preliminary blood cultures negative, rising procalcitonin. -IR to send cultures during procedure -Start her on Zosyn

## 2022-07-16 NOTE — Assessment & Plan Note (Signed)
Resolved with repletion. ?

## 2022-07-16 NOTE — Assessment & Plan Note (Deleted)
Sliding scale insulin coverage 

## 2022-07-16 NOTE — Assessment & Plan Note (Signed)
Obstructive jaundice Patient with RUQ pain and nausea, uptrending LFTs CT abdomen and pelvis showing stable dilatation of biliary tree and pancreatic duct compared to CT done on 1/31 Will keep patient n.p.o. IR consulted for stent exchange SCDs for DVT prophylaxis Pain control, IV hydration and IV antiemetics

## 2022-07-16 NOTE — Assessment & Plan Note (Addendum)
Breast cancer metastatic to intestine s/p internalize gastrojejunostomy and biliary stent On Ibrance and Femara by her oncologist, Dr Janese Banks who she last saw on 05/21/2022.

## 2022-07-16 NOTE — Assessment & Plan Note (Deleted)
Will hold levothyroxine while n.p.o.

## 2022-07-16 NOTE — Assessment & Plan Note (Addendum)
Will hold levothyroxine for tonight while n.p.o.

## 2022-07-16 NOTE — ED Notes (Signed)
Pt going to CT

## 2022-07-17 ENCOUNTER — Encounter: Payer: Self-pay | Admitting: Internal Medicine

## 2022-07-17 ENCOUNTER — Observation Stay: Payer: Medicare HMO | Admitting: Radiology

## 2022-07-17 ENCOUNTER — Inpatient Hospital Stay: Payer: Medicare HMO

## 2022-07-17 DIAGNOSIS — Z823 Family history of stroke: Secondary | ICD-10-CM | POA: Diagnosis not present

## 2022-07-17 DIAGNOSIS — R6510 Systemic inflammatory response syndrome (SIRS) of non-infectious origin without acute organ dysfunction: Secondary | ICD-10-CM

## 2022-07-17 DIAGNOSIS — E1165 Type 2 diabetes mellitus with hyperglycemia: Secondary | ICD-10-CM

## 2022-07-17 DIAGNOSIS — K831 Obstruction of bile duct: Secondary | ICD-10-CM

## 2022-07-17 DIAGNOSIS — K5730 Diverticulosis of large intestine without perforation or abscess without bleeding: Secondary | ICD-10-CM | POA: Diagnosis not present

## 2022-07-17 DIAGNOSIS — R1011 Right upper quadrant pain: Secondary | ICD-10-CM | POA: Diagnosis not present

## 2022-07-17 DIAGNOSIS — Z825 Family history of asthma and other chronic lower respiratory diseases: Secondary | ICD-10-CM | POA: Diagnosis not present

## 2022-07-17 DIAGNOSIS — R7989 Other specified abnormal findings of blood chemistry: Secondary | ICD-10-CM | POA: Diagnosis not present

## 2022-07-17 DIAGNOSIS — T85590A Other mechanical complication of bile duct prosthesis, initial encounter: Secondary | ICD-10-CM | POA: Diagnosis not present

## 2022-07-17 DIAGNOSIS — C50919 Malignant neoplasm of unspecified site of unspecified female breast: Secondary | ICD-10-CM | POA: Diagnosis not present

## 2022-07-17 DIAGNOSIS — I10 Essential (primary) hypertension: Secondary | ICD-10-CM | POA: Diagnosis not present

## 2022-07-17 DIAGNOSIS — Z807 Family history of other malignant neoplasms of lymphoid, hematopoietic and related tissues: Secondary | ICD-10-CM | POA: Diagnosis not present

## 2022-07-17 DIAGNOSIS — Z91010 Allergy to peanuts: Secondary | ICD-10-CM | POA: Diagnosis not present

## 2022-07-17 DIAGNOSIS — Z79899 Other long term (current) drug therapy: Secondary | ICD-10-CM | POA: Diagnosis not present

## 2022-07-17 DIAGNOSIS — C784 Secondary malignant neoplasm of small intestine: Secondary | ICD-10-CM | POA: Diagnosis not present

## 2022-07-17 DIAGNOSIS — Z8249 Family history of ischemic heart disease and other diseases of the circulatory system: Secondary | ICD-10-CM | POA: Diagnosis not present

## 2022-07-17 DIAGNOSIS — Z79890 Hormone replacement therapy (postmenopausal): Secondary | ICD-10-CM | POA: Diagnosis not present

## 2022-07-17 DIAGNOSIS — Y732 Prosthetic and other implants, materials and accessory gastroenterology and urology devices associated with adverse incidents: Secondary | ICD-10-CM | POA: Diagnosis not present

## 2022-07-17 DIAGNOSIS — E876 Hypokalemia: Secondary | ICD-10-CM | POA: Diagnosis not present

## 2022-07-17 DIAGNOSIS — Z8042 Family history of malignant neoplasm of prostate: Secondary | ICD-10-CM | POA: Diagnosis not present

## 2022-07-17 DIAGNOSIS — Z79811 Long term (current) use of aromatase inhibitors: Secondary | ICD-10-CM | POA: Diagnosis not present

## 2022-07-17 DIAGNOSIS — Z853 Personal history of malignant neoplasm of breast: Secondary | ICD-10-CM | POA: Diagnosis not present

## 2022-07-17 DIAGNOSIS — E785 Hyperlipidemia, unspecified: Secondary | ICD-10-CM | POA: Diagnosis not present

## 2022-07-17 DIAGNOSIS — K219 Gastro-esophageal reflux disease without esophagitis: Secondary | ICD-10-CM | POA: Diagnosis not present

## 2022-07-17 DIAGNOSIS — E039 Hypothyroidism, unspecified: Secondary | ICD-10-CM | POA: Diagnosis not present

## 2022-07-17 DIAGNOSIS — Z833 Family history of diabetes mellitus: Secondary | ICD-10-CM | POA: Diagnosis not present

## 2022-07-17 DIAGNOSIS — C50912 Malignant neoplasm of unspecified site of left female breast: Secondary | ICD-10-CM | POA: Diagnosis not present

## 2022-07-17 DIAGNOSIS — K5900 Constipation, unspecified: Secondary | ICD-10-CM | POA: Diagnosis not present

## 2022-07-17 HISTORY — PX: IR INT EXT BILIARY DRAIN WITH CHOLANGIOGRAM: IMG6044

## 2022-07-17 LAB — GLUCOSE, CAPILLARY
Glucose-Capillary: 101 mg/dL — ABNORMAL HIGH (ref 70–99)
Glucose-Capillary: 116 mg/dL — ABNORMAL HIGH (ref 70–99)
Glucose-Capillary: 149 mg/dL — ABNORMAL HIGH (ref 70–99)
Glucose-Capillary: 158 mg/dL — ABNORMAL HIGH (ref 70–99)
Glucose-Capillary: 199 mg/dL — ABNORMAL HIGH (ref 70–99)
Glucose-Capillary: 202 mg/dL — ABNORMAL HIGH (ref 70–99)

## 2022-07-17 LAB — COMPREHENSIVE METABOLIC PANEL
ALT: 677 U/L — ABNORMAL HIGH (ref 0–44)
AST: 554 U/L — ABNORMAL HIGH (ref 15–41)
Albumin: 3.4 g/dL — ABNORMAL LOW (ref 3.5–5.0)
Alkaline Phosphatase: 284 U/L — ABNORMAL HIGH (ref 38–126)
Anion gap: 13 (ref 5–15)
BUN: 16 mg/dL (ref 8–23)
CO2: 26 mmol/L (ref 22–32)
Calcium: 9.7 mg/dL (ref 8.9–10.3)
Chloride: 99 mmol/L (ref 98–111)
Creatinine, Ser: 0.95 mg/dL (ref 0.44–1.00)
GFR, Estimated: >60 mL/min (ref >60)
Glucose, Bld: 146 mg/dL — ABNORMAL HIGH (ref 70–99)
Potassium: 3.4 mmol/L — ABNORMAL LOW (ref 3.5–5.1)
Sodium: 138 mmol/L (ref 135–145)
Total Bilirubin: 3.9 mg/dL — ABNORMAL HIGH (ref 0.3–1.2)
Total Protein: 6.5 g/dL (ref 6.5–8.1)

## 2022-07-17 LAB — CBC
HCT: 34.6 % — ABNORMAL LOW (ref 36.0–46.0)
Hemoglobin: 12.2 g/dL (ref 12.0–15.0)
MCH: 33.2 pg (ref 26.0–34.0)
MCHC: 35.3 g/dL (ref 30.0–36.0)
MCV: 94.3 fL (ref 80.0–100.0)
Platelets: 279 10*3/uL (ref 150–400)
RBC: 3.67 MIL/uL — ABNORMAL LOW (ref 3.87–5.11)
RDW: 13.9 % (ref 11.5–15.5)
WBC: 8.5 10*3/uL (ref 4.0–10.5)
nRBC: 0 % (ref 0.0–0.2)

## 2022-07-17 LAB — PROCALCITONIN
Procalcitonin: 1.52 ng/mL
Procalcitonin: 2.35 ng/mL

## 2022-07-17 MED ORDER — MIDAZOLAM HCL 2 MG/2ML IJ SOLN
INTRAMUSCULAR | Status: AC
  Filled 2022-07-17: qty 2

## 2022-07-17 MED ORDER — SODIUM CHLORIDE 0.9% FLUSH
10.0000 mL | Freq: Three times a day (TID) | INTRAVENOUS | Status: DC
  Administered 2022-07-17 – 2022-07-19 (×5): 10 mL

## 2022-07-17 MED ORDER — DIPHENHYDRAMINE HCL 50 MG/ML IJ SOLN
INTRAMUSCULAR | Status: AC
  Filled 2022-07-17: qty 1

## 2022-07-17 MED ORDER — IOHEXOL 300 MG/ML  SOLN
8.0000 mL | Freq: Once | INTRAMUSCULAR | Status: AC | PRN
  Administered 2022-07-17: 8 mL

## 2022-07-17 MED ORDER — MIDAZOLAM HCL 2 MG/2ML IJ SOLN
INTRAMUSCULAR | Status: AC | PRN
  Administered 2022-07-17: .5 mg via INTRAVENOUS

## 2022-07-17 MED ORDER — PIPERACILLIN-TAZOBACTAM 3.375 G IVPB
3.3750 g | Freq: Three times a day (TID) | INTRAVENOUS | Status: DC
  Administered 2022-07-17 – 2022-07-19 (×6): 3.375 g via INTRAVENOUS
  Filled 2022-07-17 (×6): qty 50

## 2022-07-17 MED ORDER — LIDOCAINE HCL 1 % IJ SOLN
INTRAMUSCULAR | Status: AC
  Administered 2022-07-17: 3 mL
  Filled 2022-07-17: qty 20

## 2022-07-17 MED ORDER — FENTANYL CITRATE (PF) 100 MCG/2ML IJ SOLN
INTRAMUSCULAR | Status: AC | PRN
  Administered 2022-07-17 (×2): 25 ug via INTRAVENOUS

## 2022-07-17 MED ORDER — FENTANYL CITRATE (PF) 100 MCG/2ML IJ SOLN
INTRAMUSCULAR | Status: AC
  Filled 2022-07-17: qty 2

## 2022-07-17 MED ORDER — LEVOTHYROXINE SODIUM 88 MCG PO TABS
88.0000 ug | ORAL_TABLET | Freq: Every day | ORAL | Status: DC
  Administered 2022-07-18 – 2022-07-19 (×2): 88 ug via ORAL
  Filled 2022-07-17 (×2): qty 1

## 2022-07-17 MED ORDER — SODIUM CHLORIDE 0.9 % IV SOLN
2.0000 g | INTRAVENOUS | Status: AC
  Administered 2022-07-17: 2 g via INTRAVENOUS
  Filled 2022-07-17: qty 2

## 2022-07-17 MED ORDER — DIPHENHYDRAMINE HCL 50 MG/ML IJ SOLN
INTRAMUSCULAR | Status: AC | PRN
  Administered 2022-07-17: 50 mg via INTRAVENOUS

## 2022-07-17 MED ORDER — KETOROLAC TROMETHAMINE 15 MG/ML IJ SOLN
15.0000 mg | Freq: Once | INTRAMUSCULAR | Status: AC
  Administered 2022-07-17: 15 mg via INTRAVENOUS
  Filled 2022-07-17: qty 1

## 2022-07-17 MED ORDER — MIDAZOLAM HCL 5 MG/5ML IJ SOLN
INTRAMUSCULAR | Status: AC | PRN
  Administered 2022-07-17: .5 mg via INTRAVENOUS

## 2022-07-17 MED ORDER — POTASSIUM CHLORIDE CRYS ER 20 MEQ PO TBCR: 40.0000 meq | EXTENDED_RELEASE_TABLET | Freq: Once | ORAL | Status: DC

## 2022-07-17 NOTE — Procedures (Signed)
Interventional Radiology Procedure Note  Procedure: Image guided PTC and drain placement, right biliary system.  92F int/ext biliary drain.  Complications: None  EBL: None  Recommendations: - Routine int/ext drain care, with sterile flushes, record output - Q8 hr sterile saline flushes, 10cc - routine wound care  Signed,  Dulcy Fanny. Earleen Newport, DO

## 2022-07-17 NOTE — Progress Notes (Signed)
Patient clinically stable post 12 FR biliary tube placement, tolerated better this procedure,with benadryl 50 mg IV given prior to moderate sedation. Fentanyl 50 mcg IV as well as Versed 1 mg IV given for procedure. Vitals stable pre and post procedure. Report given to Genelle Bal RN post procedure/328

## 2022-07-17 NOTE — Hospital Course (Addendum)
Taken from H&P.  Diamond Hinton is a 67 y.o. 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  Patient had routine labs on 1/29 that showed elevated LFTs with AST 162, ALT 327 and total bili 0.4 and follow up CT abdomen and pelvis on 07/12/2022 was concerning for interval development of intra and extrahepatic biliary ductal dilatation with recommendation for correlation for stent malfunction.  Patient had outpatient appointment with IR scheduled for 2/6, however presented to ED due to becoming symptomatic.  ED course and data review: Low-grade temp of 99.5 in the ED and tachycardic to 110 with otherwise normal vitals.  WBC normal with lactic acid of 1.9.  LFTs a significant bump with AST 881, ALT 767, alk phos 331 and total bilirubin 2.7.  Blood glucose 233.  Potassium 3.3.  EKG, personally reviewed and interpreted with sinus tachycardia nonischemic CT abdomen and pelvis showed stable dilatation of the biliary tree and pancreatic duct with indwelling metallic biliary stent unchanged as further detailed below: IMPRESSION: 1. Stable dilatation of the biliary tree and pancreatic duct, with indwelling metallic biliary stent unchanged. While there is minimal gas in the stent lumen and intrahepatic biliary tree suggesting stent patency, underlying stent dysfunction is suspected given persistent increased caliber of the biliary tree. 2. Stable wall thickening along the medial aspect of the proximal duodenum, compatible with given history of metastatic breast cancer. 3. Sigmoid diverticulosis without diverticulitis. 4. Significant retained stool  throughout the colon consistent with constipation. 5.  Aortic Atherosclerosis (ICD10-I70.0).  2/5: Vitals stable.  IR was consulted and patient will be going for percutaneous transhepatic cholangiogram and drain placement.  Procalcitonin trending up at 2.35 today.  CMP with mild hypokalemia with potassium at 3.4, slight improvement in AST and ALT and worsening of T. bili at 3.9.  CBC stable.  Preliminary blood cultures negative in 12 hours.  Starting her on Zosyn due to worsening procalcitonin.  2/6: Vital stable.  Maximum temperature recorded was 100.6 over the past 24 hours.  Transaminitis and T. bili improving after having percutaneous transhepatic drain placement.  Procalcitonin improving.  Blood cultures remain negative. Oncology is recommending holding Ibrance for the next 1 to 2 weeks while acute cholangitis is being treated.  She will continue letrozole. Continue to have significant pain at drain site.  We will keep for another day for pain control.  2/7: Hemodynamically stable.  Some improvement in pain but continued to have some with ambulation.  She was given few days of MS Contin and oxycodone to use as needed.  She was advised to avoid acetaminophen until her liver functions completely normalized. She is also being given 3 days of Augmentin to complete a 5-day course for concern of cholangitis.  Patient will continue her cancer medications called letrozole and will hold Ibrance as advised by oncology and she will follow-up closely with her oncologist for further recommendations.  She will continue with rest of her home medications and need to have a follow-up at renal clinic and with her providers.

## 2022-07-17 NOTE — Progress Notes (Signed)
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  She was found to have diffuse duodenal wall thickening as well as a palpable mass in the left breast.  Duodenal biopsy as well as left breast biopsy was consistent withER positive greater than 90%, PR weakly +1 to 10% HER2 negative lobular breast cancer.  Patient underwent external biliary drain for the obstructive jaundice and palliative gastrojejunostomy surgery to relieve her gastric outlet obstruction.CT scan otherwise did not show any evidence of distant metastatic disease.   Patient was started on letrozole plus Kisqali in early April 2023.  However patient developed AKI and hyperuricemia requiring hospitalizations.  Thelma Comp was therefore stopped and patient was started on Ibrance 75 mg in May 2023.      Interval history-tolerating both Ibrance and letrozole well without any significant side effects.  Denies any abdominal pain.  She feels well overall.  She did get over a viral infection about 2 weeks ago.  ECOG PS- 1 Pain scale- 0   Review of systems- Review of Systems  Constitutional:  Negative for chills, fever, malaise/fatigue and weight loss.  HENT:  Negative for congestion, ear discharge and nosebleeds.   Eyes:  Negative for blurred vision.  Respiratory:   Negative for cough, hemoptysis, sputum production, shortness of breath and wheezing.   Cardiovascular:  Negative for chest pain, palpitations, orthopnea and claudication.  Gastrointestinal:  Negative for abdominal pain, blood in stool, constipation, diarrhea, heartburn, melena, nausea and vomiting.  Genitourinary:  Negative for dysuria, flank pain, frequency, hematuria and urgency.  Musculoskeletal:  Negative for back pain, joint pain and myalgias.  Skin:  Negative for rash.  Neurological:  Negative for dizziness, tingling, focal weakness, seizures, weakness and headaches.  Endo/Heme/Allergies:  Does not bruise/bleed easily.  Psychiatric/Behavioral:  Negative for depression and suicidal ideas. The patient does not have insomnia.       Allergies  Allergen Reactions   Peanuts [Peanut Oil] Shortness Of Breath and Itching     Past Medical History:  Diagnosis Date   Anemia    Anxiety    Breast cancer ([place])    Gallstones    GERD (gastroesophageal reflux disease)    Hyperlipidemia    Hypertension    Jaundice 08/29/2021   Rash 08/29/2021   Right upper quadrant abdominal pain 08/29/2021   UTI (urinary tract infection) 08/29/2021     Past Surgical History:  Procedure Laterality Date   BILATERAL SALPINGECTOMY  04/03/1997   CHOLECYSTECTOMY N/A 07/07/2015   Procedure: LAPAROSCOPIC CHOLECYSTECTOMY ;  Surgeon: Jules Husbands, MD;  Location: [facility] ORS;  Service: General;  Laterality: N/A;   ESOPHAGOGASTRODUODENOSCOPY  08/30/2021   Procedure: ESOPHAGOGASTRODUODENOSCOPY (EGD);  Surgeon: Lucilla Lame, MD;  Location: [facility] ENDOSCOPY;  Service: Endoscopy;;   GALLBLADDER SURGERY  07/07/2015   [facility]   IR BILIARY DRAIN PLACEMENT WITH CHOLANGIOGRAM  08/31/2021   IR BILIARY STENT(S) NEW ACCESS WITH DRAIN  02/08/2022   IR CONVERT BILIARY DRAIN TO INT EXT BILIARY DRAIN  09/02/2021   IR EXCHANGE BILIARY DRAIN  10/10/2021   IR EXCHANGE BILIARY DRAIN  12/05/2021   IR EXCHANGE BILIARY DRAIN  01/19/2022   IR  RADIOLOGIST EVAL & MGMT  03/01/2022    Social History   Socioeconomic History   Marital status: Married    Spouse name: Not on file   Number of children: Not on file   Years of education: Not on file   Highest education level: Not on file  Occupational History   Not on file  Tobacco Use   Smoking status: Never   Smokeless tobacco: Never  Vaping Use   Vaping Use: Never used  Substance and Sexual Activity   Alcohol use: No   Drug use: No   Sexual activity: Yes  Other Topics Concern   Not on file  Social History Narrative   Not on file   Social Determinants of Health   Financial Resource Strain: Low Risk  (11/24/2021)   Overall Financial Resource Strain (CARDIA)    Difficulty of Paying Living Expenses: Not hard at all  Food Insecurity: No Food Insecurity (07/17/2022)   Hunger Vital Sign    Worried About Running Out of Food in the Last Year: Never true    Ran Out of Food in the Last Year: Never true  Transportation Needs: No Transportation Needs (07/17/2022)   PRAPARE - Hydrologist (Medical): No    Lack of Transportation (Non-Medical): No  Physical Activity: Not on file  Stress: No Stress Concern Present (11/24/2021)   [place]    Feeling of Stress : Only a little  Social Connections: Unknown (11/24/2021)   Social Connection and Isolation Panel [NHANES]    Frequency of Communication with Friends and Family: Three times a week    Frequency of Social Gatherings with Friends and Family: Three times a week    Attends Religious Services: Patient refused    Active Member of Clubs or Organizations: Patient refused    Attends Archivist Meetings: Patient refused    Marital Status: Married  Human resources officer Violence: Not At Risk (07/17/2022)   Humiliation, Afraid, Rape, and Kick questionnaire    Fear of Current or Ex-Partner: No    Emotionally Abused: No    Physically Abused: No     Sexually Abused: No    Family History  Problem Relation Age of Onset   Hypertension Mother    CVA Mother    Ulcers Mother    Emphysema Mother    Heart disease Father    Prostate cancer Father    Macular degeneration Father    Hypertension Sister    Diabetes Sister    Cervical polyp Sister    Hypertension Sister    Diabetes Sister    Lymphoma Sister     No current facility-administered medications for this visit. No current outpatient medications on file.  Facility-Administered Medications Ordered in Other Visits:    0.9 % NaCl with KCl 20 mEq/ L  infusion, , Intravenous, Continuous, Judd Gaudier V, MD, Last Rate: 150 mL/hr at 07/17/22 0351, Infusion Verify at 07/17/22 0351   acetaminophen (TYLENOL) tablet 650 mg, 650 mg, Oral, Q6H PRN **OR** acetaminophen (TYLENOL) suppository 650 mg, 650 mg, Rectal, Q6H PRN, Athena Masse, MD, 650 mg at 07/17/22 0553   bisacodyl (DULCOLAX) suppository 10 mg, 10 mg, Rectal, Daily PRN, Judd Gaudier  V, MD   hydrALAZINE (APRESOLINE) injection 5 mg, 5 mg, Intravenous, Q4H PRN, Judd Gaudier V, MD   insulin aspart (novoLOG) injection 0-15 Units, 0-15 Units, Subcutaneous, Q4H, Athena Masse, MD, 3 Units at 07/17/22 0525   morphine (PF) 2 MG/ML injection 2 mg, 2 mg, Intravenous, Q2H PRN, Athena Masse, MD   ondansetron (ZOFRAN) tablet 4 mg, 4 mg, Oral, Q6H PRN **OR** ondansetron (ZOFRAN) injection 4 mg, 4 mg, Intravenous, Q6H PRN, Athena Masse, MD  Physical exam:  Vitals:   07/10/22 1504 07/10/22 1509 07/10/22 1511  BP:   124/73  Pulse:  89   Resp:  18 18  SpO2:  99%   Weight: 139 lb (63 kg)     Physical Exam Cardiovascular:     Rate and Rhythm: Normal rate and regular rhythm.     Heart sounds: Normal heart sounds.  Pulmonary:     Effort: Pulmonary effort is normal.     Breath sounds: Normal breath sounds.  Abdominal:     General: Bowel sounds are normal.     Palpations: Abdomen is soft.  Skin:    General: Skin is warm and  dry.  Neurological:     Mental Status: She is alert and oriented to person, place, and time.         Latest Ref Rng & Units 07/17/2022    5:05 AM  CMP  Glucose 70 - 99 mg/dL 146   BUN 8 - 23 mg/dL 16   Creatinine 0.44 - 1.00 mg/dL 0.95   Sodium 135 - 145 mmol/L 138   Potassium 3.5 - 5.1 mmol/L 3.4   Chloride 98 - 111 mmol/L 99   CO2 22 - 32 mmol/L 26   Calcium 8.9 - 10.3 mg/dL 9.7   Total Protein 6.5 - 8.1 g/dL 6.5   Total Bilirubin 0.3 - 1.2 mg/dL 3.9   Alkaline Phos 38 - 126 U/L 284   AST 15 - 41 U/L 554   ALT 0 - 44 U/L 677       Latest Ref Rng & Units 07/17/2022    5:05 AM  CBC  WBC 4.0 - 10.5 K/uL 8.5   Hemoglobin 12.0 - 15.0 g/dL 12.2   Hematocrit 36.0 - 46.0 % 34.6   Platelets 150 - 400 K/uL 279     No images are attached to the encounter.  CT Abdomen Pelvis W Contrast  Result Date: 07/16/2022 CLINICAL DATA:  Increasing abdominal pain, nausea, history of metastatic breast cancer EXAM: CT ABDOMEN AND PELVIS WITH CONTRAST TECHNIQUE: Multidetector CT imaging of the abdomen and pelvis was performed using the standard protocol following bolus administration of intravenous contrast. RADIATION DOSE REDUCTION: This exam was performed according to the departmental dose-optimization program which includes automated exposure control, adjustment of the mA and/or kV according to patient size and/or use of iterative reconstruction technique. CONTRAST:  169m OMNIPAQUE IOHEXOL 300 MG/ML  SOLN COMPARISON:  07/12/2022 FINDINGS: Lower chest: Hypoventilatory changes at the lung bases. No acute pleural or parenchymal lung disease. Hepatobiliary: There is persistent intrahepatic and extrahepatic biliary duct dilation, with the upstream common bile duct measuring to 18 mm. A metallic stent is identified extending from the mid common bile duct into the duodenal lumen, not appreciably changed since prior exam. A small amount of gas is seen within the lumen of the stent, as well as within the  intrahepatic bile ducts, consistent with stent patency. Otherwise the liver is unremarkable. The gallbladder is surgically absent. Pancreas: Stable  pancreatic duct dilation. No acute inflammatory changes. Spleen: Normal in size without focal abnormality. Adrenals/Urinary Tract: Adrenal glands are unremarkable. Kidneys are normal, without renal calculi, focal lesion, or hydronephrosis. Bladder is unremarkable. Stomach/Bowel: No bowel obstruction or ileus. Normal appendix right lower quadrant. Diverticulosis of the sigmoid colon without diverticulitis. There is moderate retained stool within the colon. Persistent wall thickening along the medial aspect of the proximal duodenum, unchanged. Postsurgical changes from gastrojejunostomy. Vascular/Lymphatic: Aortic atherosclerosis. Stable subcentimeter retroperitoneal lymph nodes. No pathologic adenopathy. Reproductive: Uterus and bilateral adnexa are unremarkable. Other: No free fluid or free intraperitoneal gas. No abdominal wall hernia. Musculoskeletal: There are no acute or destructive bony lesions. Reconstructed images demonstrate no additional findings. IMPRESSION: 1. Stable dilatation of the biliary tree and pancreatic duct, with indwelling metallic biliary stent unchanged. While there is minimal gas in the stent lumen and intrahepatic biliary tree suggesting stent patency, underlying stent dysfunction is suspected given persistent increased caliber of the biliary tree. 2. Stable wall thickening along the medial aspect of the proximal duodenum, compatible with given history of metastatic breast cancer. 3. Sigmoid diverticulosis without diverticulitis. 4. Significant retained stool throughout the colon consistent with constipation. 5.  Aortic Atherosclerosis (ICD10-I70.0). Electronically Signed   By: Randa Ngo M.D.   On: 07/16/2022 21:37   CT CHEST ABDOMEN PELVIS W CONTRAST  Result Date: 07/12/2022 CLINICAL DATA:  Breast cancer restaging. * Tracking Code: BO  * history of metastatic disease to the duodenum. EXAM: CT CHEST, ABDOMEN, AND PELVIS WITH CONTRAST TECHNIQUE: Multidetector CT imaging of the chest, abdomen and pelvis was performed following the standard protocol during bolus administration of intravenous contrast. RADIATION DOSE REDUCTION: This exam was performed according to the departmental dose-optimization program which includes automated exposure control, adjustment of the mA and/or kV according to patient size and/or use of iterative reconstruction technique. CONTRAST:  125m OMNIPAQUE IOHEXOL 300 MG/ML  SOLN COMPARISON:  01/25/2022 FINDINGS: CT CHEST FINDINGS Cardiovascular: The heart size is normal. No substantial pericardial effusion. Mild atherosclerotic calcification is noted in the wall of the thoracic aorta. Mediastinum/Nodes: No mediastinal lymphadenopathy. There is no hilar lymphadenopathy. The esophagus has normal imaging features. There is no axillary lymphadenopathy. Lungs/Pleura: No suspicious pulmonary nodule or mass. No focal airspace consolidation. No pleural effusion. Similar appearance of subsegmental atelectasis/linear scarring in the lower lungs bilaterally. Musculoskeletal: No worrisome lytic or sclerotic osseous abnormality. CT ABDOMEN PELVIS FINDINGS Hepatobiliary: No suspicious focal abnormality within the liver parenchyma. Intra and extrahepatic biliary duct dilatation is new in the interval. Pneumobilia is compatible with the presence of the common bile duct stent device common duct measures up to 18 mm diameter. Common bile duct around the stent in the head of the pancreas is 14 mm diameter. Pancreas: Diffuse pancreatic atrophy noted with dilatation of the main pancreatic duct, similar to prior. Spleen: No splenomegaly. No focal mass lesion. Adrenals/Urinary Tract: No adrenal nodule or mass. Kidneys unremarkable. No evidence for hydroureter. The urinary bladder appears normal for the degree of distention. Stomach/Bowel: Stomach is  unremarkable. No gastric wall thickening. Status post gastrojejunostomy. Wall of the descending duodenum is ill-defined and appears thickened, as before. Stent device noted in the transverse segment of the duodenum. No small bowel wall thickening. No small bowel dilatation. The terminal ileum is normal. No gross colonic mass. No colonic wall thickening. Mild diverticular changes noted sigmoid colon without diverticulitis. Vascular/Lymphatic: There is moderate atherosclerotic calcification of the abdominal aorta without aneurysm. A There is no gastrohepatic or hepatoduodenal ligament lymphadenopathy. No retroperitoneal or  mesenteric lymphadenopathy. No pelvic sidewall lymphadenopathy. Reproductive: Unremarkable. Other: No intraperitoneal free fluid. Musculoskeletal: No worrisome lytic or sclerotic osseous abnormality. IMPRESSION: 1. Interval development of intra and extrahepatic biliary duct dilatation with common bile duct stent device in place. Correlation for stent malfunction recommended. Gas in the biliary tree is presumably secondary to the presence of the stent device. 2. Wall of the descending duodenum is ill-defined and transverse segment appears thickened, as before. 3. Status post gastrojejunostomy. 4. No evidence for new metastatic disease in the chest, abdomen, or pelvis. 5.  Aortic Atherosclerosis (ICD10-I70.0). Electronically Signed   By: Misty Stanley M.D.   On: 07/12/2022 10:54   DG Bone Density  Result Date: 07/04/2022 EXAM: DUAL X-RAY ABSORPTIOMETRY (DXA) FOR BONE MINERAL DENSITY IMPRESSION: Dear Dr Janese Banks, Your patient IVYROSE HASHMAN completed a FRAX assessment on 07/04/2022 using the [place] (analysis version: 14.10) manufactured by [employer]. The following summarizes the results of our evaluation. PATIENT BIOGRAPHICAL: Name: Vandy, Fong Patient ID: [id number] Birth Date: 11/06/1955 Height:    62.0 in. Gender:     [age]    [age]       Weight:    140.2 lbs.  Ethnicity:  White                            Exam Date: 07/04/2022 FRAX* RESULTS:  (version: 3.5) 10-year Probability of Fracture1 Major Osteoporotic Fracture2 Hip Fracture 13.2% 0.9% Population: [place] (Caucasian) Risk Factors: History of Fracture (Adult) Based on Femur (Left) Neck BMD 1 -The 10-year probability of fracture may be lower than reported if the patient has received treatment. 2 -Major Osteoporotic Fracture: Clinical Spine, Forearm, Hip or Shoulder *FRAX is a Materials engineer of the [employer] for Metabolic Bone Disease, a [place] (WHO) [facility]. ASSESSMENT: The probability of a major osteoporotic fracture is 13.2% within the next ten years. The probability of a hip fracture is 0.9% within the next ten years. . Your patient Katia Hannen completed a BMD test on 07/04/2022 using the [place] (software version: 14.10) manufactured by [employer]. The following summarizes the results of our evaluation. Technologist: mtb PATIENT BIOGRAPHICAL: Name: Shoshanah, Dapper Patient ID: [id number] Birth Date: 03-Oct-1955 Height: 62.0 in. Gender: Female Exam Date: 07/04/2022 Weight: 140.2 lbs. Indications: Breast CA, Caucasian, Hypothyroid, Postmenopausal, History of Fracture (Adult) Fractures: Wrist Right Treatments: Calcium, Vitamin D DENSITOMETRY RESULTS: Site         Region     Measured Date Measured Age WHO Classification Young Adult T-score BMD         %Change vs. Previous Significant Change (*) AP Spine L1-L2 07/04/2020 68.4 Osteopenia -1.1 1.042 g/cm2 - - DualFemur Neck Left 07/04/2022 68.4 Normal -0.7 0.942 g/cm2 - - DualFemur Total Mean 07/04/2022 68.4 Normal -0.4 0.952 g/cm2 - - Left Forearm Radius 33% 07/04/2022 68.4 Normal -0.7 0.814 g/cm2 - - ASSESSMENT: The BMD measured at AP Spine L1-L2 is 1.042 g/cm2 with a T-score of -1.1. This patient is considered osteopenic according to [facility]) criteria. The scan  quality is good. L-3 and L-4 were excluded due to degenerative changes. World Health Organization [facility]) criteria for post-menopausal, Caucasian Women: Normal:                   T-score at or above -1 SD Osteopenia/low bone mass: T-score between -1  and -2.5 SD Osteoporosis:             T-score at or below -2.5 SD RECOMMENDATIONS: 1. All patients should optimize calcium and vitamin D intake. 2. Consider FDA-approved medical therapies in postmenopausal women and men aged 85 years and older, based on the following: a. A hip or vertebral(clinical or morphometric) fracture b. T-score < -2.5 at the femoral neck or spine after appropriate evaluation to exclude secondary causes c. Low bone mass (T-score between -1.0 and -2.5 at the femoral neck or spine) and a 10-year probability of a hip fracture > 3% or a 10-year probability of a major osteoporosis-related fracture > 20% based on the [place]-adapted WHO algorithm 3. Clinician judgment and/or patient preferences may indicate treatment for people with 10-year fracture probabilities above or below these levels FOLLOW-UP: People with diagnosed cases of osteoporosis or at high risk for fracture should have regular bone mineral density tests. For patients eligible for Medicare, routine testing is allowed once every 2 years. The testing frequency can be increased to one year for patients who have rapidly progressing disease, those who are receiving or discontinuing medical therapy to restore bone mass, or have additional risk factors. I have reviewed this report, and agree with the above findings. [facility] Electronically Signed   By: Zerita Boers M.D.   On: 07/04/2022 13:15     Assessment and plan- Patient is a 67 y.o. 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  She is currently on letrozole and Ibrance and here for routine follow-up  Abnormal LFTs: LFTs which  were normal before are currently elevated at162 and 327 respectively for AST and ALT.  Total bilirubin is normal.  Given that she has a stent in place that was internalized I recommended getting a CT chest abdomen and pelvis with contrast.  Depending on the findings of the CT we will see if any intervention is required.  Otherwise my plan is to follow her LFTs on a weekly basis.  Metastatic breast cancer: Continue letrozole plus Ibrance.  She will be getting CT chest abdomen and pelvis with contrast and a bone scan for surveillance imaging.  Labs in 1 week in 2 weeks and I will see her back in 2 weeks with CT scans prior   Visit Diagnosis 1. 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 High risk medication use   3. Use of letrozole ([place])   4. Abnormal LFTs      Dr. Randa Evens, MD, MPH [facility] [phone] 07/17/2022 8:19 AM

## 2022-07-17 NOTE — Consult Note (Signed)
Pharmacy Antibiotic Note  Diamond Hinton is a 67 y.o. female admitted on 07/16/2022 with intra-abdominal infection.  Pharmacy has been consulted for Zosyn dosing.  Plan: Zosyn 3.375g IV q8h (4 hour infusion). Follow renal function for adjustments  Height: '5\' 3"'$  (160 cm) Weight: 63.5 kg (140 lb) IBW/kg (Calculated) : 52.4  Temp (24hrs), Avg:99.3 F (37.4 C), Min:98.3 F (36.8 C), Max:101.1 F (38.4 C)  Recent Labs  Lab 07/10/22 1440 07/16/22 1949 07/16/22 2056 07/16/22 2240 07/17/22 0505  WBC 5.6 9.8  --   --  8.5  CREATININE 1.17* 1.01*  --   --  0.95  LATICACIDVEN  --   --  2.1* 1.9  --     Estimated Creatinine Clearance: 50.8 mL/min (by C-G formula based on SCr of 0.95 mg/dL).    Allergies  Allergen Reactions   Peanuts [Peanut Oil] Shortness Of Breath and Itching    Antimicrobials this admission: Zosyn 2/5 >>    Dose adjustments this admission: N/A  Microbiology results: 2/4 BCx: ngtd  Thank you for allowing pharmacy to be a part of this patient's care.  Lorin Picket, PharmD 07/17/2022 12:22 PM

## 2022-07-17 NOTE — Consult Note (Signed)
Chief Complaint: Patient was seen in consultation today for biliary obstruction at the request of Judd Gaudier, MD Referring Physician(s): Judd Gaudier, MD  Supervising Physician: Corrie Mckusick  Patient Status: [place] - In-pt  History of Present Illness: Diamond Hinton is a 67 y.o. female well-known to IR service having biliary drain placement due to biliary obstruction March 2023.  Patient has been seen several times for biliary drain exchanges since that time.  Patient underwent biliary stent placement 02/08/2022.  She was scheduled for follow-up 07/18/2022 but became symptomatic and presented to [facility] ED for evaluation with complaint of right upper quadrant pain nausea without emesis.  ED workup showed low-grade temp 99.5, tachycardia 110, WBC WNL and lactate of 1.9.  LFTs were elevated with AST 881, ALT of 767, alk phos of 331 and total bili of 2.7.  CT AP showed stable dilation of biliary tree and pancreatic duct with indwelling metal biliary stent.  Patient was referred to IR for evaluation of biliary obstruction.  Imaging was reviewed by Dr. Earleen Newport and approved for percutaneous transhepatic cholangiogram and drainage.   Pt denies SOB, chills, appetite change, emesis, fatigue or weakness.  She endorses fever, epigastric pain, abd pain and nausea.  She is NPO per order. Past Medical History:  Diagnosis Date   Anemia    Anxiety    Breast cancer ([place])    Gallstones    GERD (gastroesophageal reflux disease)    Hyperlipidemia    Hypertension    Jaundice 08/29/2021   Rash 08/29/2021   Right upper quadrant abdominal pain 08/29/2021   UTI (urinary tract infection) 08/29/2021    Past Surgical History:  Procedure Laterality Date   BILATERAL SALPINGECTOMY  04/03/1997   CHOLECYSTECTOMY N/A 07/07/2015   Procedure: LAPAROSCOPIC CHOLECYSTECTOMY ;  Surgeon: Jules Husbands, MD;  Location: [facility] ORS;  Service: General;  Laterality: N/A;   ESOPHAGOGASTRODUODENOSCOPY  08/30/2021   Procedure:  ESOPHAGOGASTRODUODENOSCOPY (EGD);  Surgeon: Lucilla Lame, MD;  Location: [facility] ENDOSCOPY;  Service: Endoscopy;;   GALLBLADDER SURGERY  07/07/2015   [facility]   IR BILIARY DRAIN PLACEMENT WITH CHOLANGIOGRAM  08/31/2021   IR BILIARY STENT(S) NEW ACCESS WITH DRAIN  02/08/2022   IR CONVERT BILIARY DRAIN TO INT EXT BILIARY DRAIN  09/02/2021   IR EXCHANGE BILIARY DRAIN  10/10/2021   IR EXCHANGE BILIARY DRAIN  12/05/2021   IR EXCHANGE BILIARY DRAIN  01/19/2022   IR RADIOLOGIST EVAL & MGMT  03/01/2022    Allergies: Peanuts [peanut oil]  Medications: Prior to Admission medications   Medication Sig Start Date End Date Taking? Authorizing Provider  hydrochlorothiazide (HYDRODIURIL) 25 MG tablet Take 1 tablet (25 mg total) by mouth daily. 04/20/22  Yes Bacigalupo, Dionne Bucy, MD  letrozole [facility]) 2.5 MG tablet Take 1 tablet (2.5 mg total) by mouth daily. 06/16/22  Yes Sindy Guadeloupe, MD  levothyroxine (SYNTHROID) 88 MCG tablet Take 1 tablet by mouth once daily 06/30/22  Yes Bacigalupo, Dionne Bucy, MD  palbociclib [facility]) 100 MG tablet Take 1 tablet (100 mg total) by mouth daily. Take for 21 days on, 7 days off, repeat every 28 days. 05/16/22  Yes Sindy Guadeloupe, MD  acetaminophen (TYLENOL) 325 MG tablet Take 325 mg by mouth every 6 (six) hours as needed (for pain and sometimes takes 2 if pain is worse).    [provider]  baclofen (LIORESAL) 10 MG tablet Take 1 tablet (10 mg total) by mouth 3 (three) times daily. Patient not taking: Reported on 07/10/2022 04/25/22  Virginia Crews, MD  Multiple Vitamins-Minerals (CENTRUM VITAMINTS PO) Take 1 tablet by mouth daily.    [provider]  ondansetron (ZOFRAN) 4 MG tablet Take 1 tablet (4 mg total) by mouth daily as needed for nausea or vomiting. Patient not taking: Reported on 05/16/2022 02/14/22 02/14/23  Sindy Guadeloupe, MD  pantoprazole (PROTONIX) 40 MG tablet Take 40 mg by mouth daily. Patient not taking: Reported on 07/10/2022 12/29/21   [provider]  prochlorperazine (COMPAZINE) 5 MG tablet Take 1 tablet (5 mg total) by mouth every 6 (six) hours as needed for refractory nausea / vomiting. Patient not taking: Reported on 07/10/2022 02/14/22   Sindy Guadeloupe, MD  Simethicone (GAS-X PO) Take by mouth. PRN Patient not taking: Reported on 07/10/2022    [provider]  traZODone (DESYREL) 50 MG tablet Take 1 tablet (50 mg total) by mouth at bedtime as needed for sleep. Patient not taking: Reported on 07/10/2022 10/07/21   Hughie Closs, PA-C     Family History  Problem Relation Age of Onset   Hypertension Mother    CVA Mother    Ulcers Mother    Emphysema Mother    Heart disease Father    Prostate cancer Father    Macular degeneration Father    Hypertension Sister    Diabetes Sister    Cervical polyp Sister    Hypertension Sister    Diabetes Sister    Lymphoma Sister     Social History   Socioeconomic History   Marital status: Married    Spouse name: Not on file   Number of children: Not on file   Years of education: Not on file   Highest education level: Not on file  Occupational History   Not on file  Tobacco Use   Smoking status: Never   Smokeless tobacco: Never  Vaping Use   Vaping Use: Never used  Substance and Sexual Activity   Alcohol use: No   Drug use: No   Sexual activity: Yes  Other Topics Concern   Not on file  Social History Narrative   Not on file   Social Determinants of Health   Financial Resource Strain: Low Risk  (11/24/2021)   Overall Financial Resource Strain (CARDIA)    Difficulty of Paying Living Expenses: Not hard at all  Food Insecurity: No Food Insecurity (07/17/2022)   Hunger Vital Sign    Worried About Running Out of Food in the Last Year: Never true    Ran Out of Food in the Last Year: Never true  Transportation Needs: No Transportation Needs (07/17/2022)   PRAPARE - Hydrologist (Medical): No    Lack of Transportation  (Non-Medical): No  Physical Activity: Not on file  Stress: No Stress Concern Present (11/24/2021)   [place]    Feeling of Stress : Only a little  Social Connections: Unknown (11/24/2021)   Social Connection and Isolation Panel [NHANES]    Frequency of Communication with Friends and Family: Three times a week    Frequency of Social Gatherings with Friends and Family: Three times a week    Attends Religious Services: Patient refused    Active Member of Clubs or Organizations: Patient refused    Attends Archivist Meetings: Patient refused    Marital Status: Married    Review of Systems: A 12 point ROS discussed and pertinent positives are indicated in the HPI  above.  All other systems are negative.  Review of Systems  Constitutional:  Positive for fever. Negative for appetite change, chills and fatigue.  Respiratory:  Negative for shortness of breath.   Cardiovascular:  Positive for chest pain. Negative for leg swelling.  Gastrointestinal:  Positive for abdominal distention, abdominal pain and nausea. Negative for vomiting.  Neurological:  Negative for dizziness, weakness and headaches.    Vital Signs: BP 94/65 (BP Location: Left Arm)   Pulse 77   Temp 98.8 F (37.1 C) (Oral)   Resp 16   Ht '5\' 3"'$  (1.6 m)   Wt 140 lb (63.5 kg)   SpO2 93%   BMI 24.80 kg/m     Physical Exam Vitals reviewed.  Constitutional:      General: She is not in acute distress.    Appearance: Normal appearance. She is ill-appearing.  HENT:     Head: Normocephalic and atraumatic.     Mouth/Throat:     Mouth: Mucous membranes are dry.     Pharynx: Oropharynx is clear.  Eyes:     General: No scleral icterus.    Extraocular Movements: Extraocular movements intact.     Pupils: Pupils are equal, round, and reactive to light.  Cardiovascular:     Rate and Rhythm: Normal rate and regular rhythm.     Pulses: Normal pulses.      Heart sounds: Normal heart sounds. No murmur heard. Pulmonary:     Effort: Pulmonary effort is normal. No respiratory distress.     Breath sounds: Normal breath sounds.  Abdominal:     General: Bowel sounds are normal. There is no distension.     Palpations: Abdomen is soft.     Tenderness: There is abdominal tenderness. There is no guarding.  Musculoskeletal:     Right lower leg: No edema.     Left lower leg: No edema.  Skin:    General: Skin is warm and dry.     Coloration: Skin is jaundiced.  Neurological:     Mental Status: She is alert and oriented to person, place, and time.  Psychiatric:        Mood and Affect: Mood normal.        Behavior: Behavior normal.        Thought Content: Thought content normal.        Judgment: Judgment normal.     Imaging: CT Abdomen Pelvis W Contrast  Result Date: 07/16/2022 CLINICAL DATA:  Increasing abdominal pain, nausea, history of metastatic breast cancer EXAM: CT ABDOMEN AND PELVIS WITH CONTRAST TECHNIQUE: Multidetector CT imaging of the abdomen and pelvis was performed using the standard protocol following bolus administration of intravenous contrast. RADIATION DOSE REDUCTION: This exam was performed according to the departmental dose-optimization program which includes automated exposure control, adjustment of the mA and/or kV according to patient size and/or use of iterative reconstruction technique. CONTRAST:  157m OMNIPAQUE IOHEXOL 300 MG/ML  SOLN COMPARISON:  07/12/2022 FINDINGS: Lower chest: Hypoventilatory changes at the lung bases. No acute pleural or parenchymal lung disease. Hepatobiliary: There is persistent intrahepatic and extrahepatic biliary duct dilation, with the upstream common bile duct measuring to 18 mm. A metallic stent is identified extending from the mid common bile duct into the duodenal lumen, not appreciably changed since prior exam. A small amount of gas is seen within the lumen of the stent, as well as within the  intrahepatic bile ducts, consistent with stent patency. Otherwise the liver is unremarkable. The gallbladder is surgically absent.  Pancreas: Stable pancreatic duct dilation. No acute inflammatory changes. Spleen: Normal in size without focal abnormality. Adrenals/Urinary Tract: Adrenal glands are unremarkable. Kidneys are normal, without renal calculi, focal lesion, or hydronephrosis. Bladder is unremarkable. Stomach/Bowel: No bowel obstruction or ileus. Normal appendix right lower quadrant. Diverticulosis of the sigmoid colon without diverticulitis. There is moderate retained stool within the colon. Persistent wall thickening along the medial aspect of the proximal duodenum, unchanged. Postsurgical changes from gastrojejunostomy. Vascular/Lymphatic: Aortic atherosclerosis. Stable subcentimeter retroperitoneal lymph nodes. No pathologic adenopathy. Reproductive: Uterus and bilateral adnexa are unremarkable. Other: No free fluid or free intraperitoneal gas. No abdominal wall hernia. Musculoskeletal: There are no acute or destructive bony lesions. Reconstructed images demonstrate no additional findings. IMPRESSION: 1. Stable dilatation of the biliary tree and pancreatic duct, with indwelling metallic biliary stent unchanged. While there is minimal gas in the stent lumen and intrahepatic biliary tree suggesting stent patency, underlying stent dysfunction is suspected given persistent increased caliber of the biliary tree. 2. Stable wall thickening along the medial aspect of the proximal duodenum, compatible with given history of metastatic breast cancer. 3. Sigmoid diverticulosis without diverticulitis. 4. Significant retained stool throughout the colon consistent with constipation. 5.  Aortic Atherosclerosis (ICD10-I70.0). Electronically Signed   By: Randa Ngo M.D.   On: 07/16/2022 21:37   CT CHEST ABDOMEN PELVIS W CONTRAST  Result Date: 07/12/2022 CLINICAL DATA:  Breast cancer restaging. * Tracking Code: BO  * history of metastatic disease to the duodenum. EXAM: CT CHEST, ABDOMEN, AND PELVIS WITH CONTRAST TECHNIQUE: Multidetector CT imaging of the chest, abdomen and pelvis was performed following the standard protocol during bolus administration of intravenous contrast. RADIATION DOSE REDUCTION: This exam was performed according to the departmental dose-optimization program which includes automated exposure control, adjustment of the mA and/or kV according to patient size and/or use of iterative reconstruction technique. CONTRAST:  152m OMNIPAQUE IOHEXOL 300 MG/ML  SOLN COMPARISON:  01/25/2022 FINDINGS: CT CHEST FINDINGS Cardiovascular: The heart size is normal. No substantial pericardial effusion. Mild atherosclerotic calcification is noted in the wall of the thoracic aorta. Mediastinum/Nodes: No mediastinal lymphadenopathy. There is no hilar lymphadenopathy. The esophagus has normal imaging features. There is no axillary lymphadenopathy. Lungs/Pleura: No suspicious pulmonary nodule or mass. No focal airspace consolidation. No pleural effusion. Similar appearance of subsegmental atelectasis/linear scarring in the lower lungs bilaterally. Musculoskeletal: No worrisome lytic or sclerotic osseous abnormality. CT ABDOMEN PELVIS FINDINGS Hepatobiliary: No suspicious focal abnormality within the liver parenchyma. Intra and extrahepatic biliary duct dilatation is new in the interval. Pneumobilia is compatible with the presence of the common bile duct stent device common duct measures up to 18 mm diameter. Common bile duct around the stent in the head of the pancreas is 14 mm diameter. Pancreas: Diffuse pancreatic atrophy noted with dilatation of the main pancreatic duct, similar to prior. Spleen: No splenomegaly. No focal mass lesion. Adrenals/Urinary Tract: No adrenal nodule or mass. Kidneys unremarkable. No evidence for hydroureter. The urinary bladder appears normal for the degree of distention. Stomach/Bowel: Stomach is  unremarkable. No gastric wall thickening. Status post gastrojejunostomy. Wall of the descending duodenum is ill-defined and appears thickened, as before. Stent device noted in the transverse segment of the duodenum. No small bowel wall thickening. No small bowel dilatation. The terminal ileum is normal. No gross colonic mass. No colonic wall thickening. Mild diverticular changes noted sigmoid colon without diverticulitis. Vascular/Lymphatic: There is moderate atherosclerotic calcification of the abdominal aorta without aneurysm. A There is no gastrohepatic or hepatoduodenal ligament lymphadenopathy. No  retroperitoneal or mesenteric lymphadenopathy. No pelvic sidewall lymphadenopathy. Reproductive: Unremarkable. Other: No intraperitoneal free fluid. Musculoskeletal: No worrisome lytic or sclerotic osseous abnormality. IMPRESSION: 1. Interval development of intra and extrahepatic biliary duct dilatation with common bile duct stent device in place. Correlation for stent malfunction recommended. Gas in the biliary tree is presumably secondary to the presence of the stent device. 2. Wall of the descending duodenum is ill-defined and transverse segment appears thickened, as before. 3. Status post gastrojejunostomy. 4. No evidence for new metastatic disease in the chest, abdomen, or pelvis. 5.  Aortic Atherosclerosis (ICD10-I70.0). Electronically Signed   By: Misty Stanley M.D.   On: 07/12/2022 10:54   DG Bone Density  Result Date: 07/04/2022 EXAM: DUAL X-RAY ABSORPTIOMETRY (DXA) FOR BONE MINERAL DENSITY IMPRESSION: Dear Dr Janese Banks, Your patient ZAMARIAH SEABORN completed a FRAX assessment on 07/04/2022 using the [place] (analysis version: 14.10) manufactured by [employer]. The following summarizes the results of our evaluation. PATIENT BIOGRAPHICAL: Name: Connelly, Spruell Patient ID: [id number] Birth Date: 01-31-1956 Height:    62.0 in. Gender:     [age]    Age:        40.4       Weight:    140.2 lbs.  Ethnicity:  White                            Exam Date: 07/04/2022 FRAX* RESULTS:  (version: 3.5) 10-year Probability of Fracture1 Major Osteoporotic Fracture2 Hip Fracture 13.2% 0.9% Population: [place] (Caucasian) Risk Factors: History of Fracture (Adult) Based on Femur (Left) Neck BMD 1 -The 10-year probability of fracture may be lower than reported if the patient has received treatment. 2 -Major Osteoporotic Fracture: Clinical Spine, Forearm, Hip or Shoulder *FRAX is a Materials engineer of the [employer] for Metabolic Bone Disease, a [place] (WHO) [facility]. ASSESSMENT: The probability of a major osteoporotic fracture is 13.2% within the next ten years. The probability of a hip fracture is 0.9% within the next ten years. . Your patient Jasemine Nawaz completed a BMD test on 07/04/2022 using the [place] (software version: 14.10) manufactured by [employer]. The following summarizes the results of our evaluation. Technologist: mtb PATIENT BIOGRAPHICAL: Name: Evani, Shrider Patient ID: [id number] Birth Date: 08/28/1955 Height: 62.0 in. Gender: Female Exam Date: 07/04/2022 Weight: 140.2 lbs. Indications: Breast CA, Caucasian, Hypothyroid, Postmenopausal, History of Fracture (Adult) Fractures: Wrist Right Treatments: Calcium, Vitamin D DENSITOMETRY RESULTS: Site         Region     Measured Date Measured Age WHO Classification Young Adult T-score BMD         %Change vs. Previous Significant Change (*) AP Spine L1-L2 07/04/2020 68.4 Osteopenia -1.1 1.042 g/cm2 - - DualFemur Neck Left 07/04/2022 68.4 Normal -0.7 0.942 g/cm2 - - DualFemur Total Mean 07/04/2022 68.4 Normal -0.4 0.952 g/cm2 - - Left Forearm Radius 33% 07/04/2022 68.4 Normal -0.7 0.814 g/cm2 - - ASSESSMENT: The BMD measured at AP Spine L1-L2 is 1.042 g/cm2 with a T-score of -1.1. This patient is considered osteopenic according to [facility]) criteria. The scan  quality is good. L-3 and L-4 were excluded due to degenerative changes. World Health Organization [facility]) criteria for post-menopausal, Caucasian Women: Normal:                   T-score at or above -1 SD Osteopenia/low bone mass: T-score  between -1 and -2.5 SD Osteoporosis:             T-score at or below -2.5 SD RECOMMENDATIONS: 1. All patients should optimize calcium and vitamin D intake. 2. Consider FDA-approved medical therapies in postmenopausal women and men aged 24 years and older, based on the following: a. A hip or vertebral(clinical or morphometric) fracture b. T-score < -2.5 at the femoral neck or spine after appropriate evaluation to exclude secondary causes c. Low bone mass (T-score between -1.0 and -2.5 at the femoral neck or spine) and a 10-year probability of a hip fracture > 3% or a 10-year probability of a major osteoporosis-related fracture > 20% based on the [place]-adapted WHO algorithm 3. Clinician judgment and/or patient preferences may indicate treatment for people with 10-year fracture probabilities above or below these levels FOLLOW-UP: People with diagnosed cases of osteoporosis or at high risk for fracture should have regular bone mineral density tests. For patients eligible for Medicare, routine testing is allowed once every 2 years. The testing frequency can be increased to one year for patients who have rapidly progressing disease, those who are receiving or discontinuing medical therapy to restore bone mass, or have additional risk factors. I have reviewed this report, and agree with the above findings. [facility] Electronically Signed   By: Zerita Boers M.D.   On: 07/04/2022 13:15    Labs:  CBC: Recent Labs    05/16/22 1340 07/10/22 1440 07/16/22 1949 07/17/22 0505  WBC 7.4 5.6 9.8 8.5  HGB 13.2 12.5 13.3 12.2  HCT 37.5 35.1* 38.3 34.6*  PLT 264 234 350 279    COAGS: Recent Labs    08/31/21 0859 10/07/21 2246 02/08/22 0918  INR 1.1 1.2 1.3*     BMP: Recent Labs    05/16/22 1340 07/10/22 1440 07/16/22 1949 07/17/22 0505  NA 138 134* 132* 138  K 3.5 3.4* 3.3* 3.4*  CL 100 93* 95* 99  CO2 '26 29 22 26  '$ GLUCOSE 141* 195* 233* 146*  BUN 24* '22 20 16  '$ CALCIUM 9.5 9.5 10.2 9.7  CREATININE 1.31* 1.17* 1.01* 0.95  GFRNONAA 44* 51* >60 >60    LIVER FUNCTION TESTS: Recent Labs    05/16/22 1340 07/10/22 1440 07/16/22 1949 07/17/22 0505  BILITOT 0.5 0.4 2.7* 3.9*  AST 24 162* 881* 554*  ALT 25 327* 767* 677*  ALKPHOS 75 229* 331* 284*  PROT 7.4 7.9 7.6 6.5  ALBUMIN 4.2 4.3 4.0 3.4*    TUMOR MARKERS: No results for input(s): "AFPTM", "CEA", "CA199", "CHROMGRNA" in the last 8760 hours.  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   Risks and benefits of percutaneous transhepatic cholangiogram and drainage with moderate sedation was discussed with the patient and/or patient's family including, but not limited to bleeding, infection, damage to adjacent structures or low yield requiring additional tests.  All of the questions were answered and there is agreement to proceed.  Consent signed and in chart.  Thank you for this interesting consult.  I greatly enjoyed meeting DAYLIN EADS and look forward to participating in their care.  A copy of this report was sent to the requesting provider on this date.  Electronically Signed: Tyson Alias, NP 07/17/2022, 9:40 AM   I spent a total of 20 minutes in face to face in clinical consultation, greater  than 50% of which was counseling/coordinating care for bilary obstruction.

## 2022-07-17 NOTE — Progress Notes (Signed)
Progress Note   Patient: Diamond Hinton OVZ:[id number] DOB: 1955/07/31 DOA: 07/16/2022     0 DOS: the patient was seen and examined on 07/17/2022   Brief hospital course: Taken from H&P.  Diamond Hinton is a 67 y.o. 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  Patient had routine labs on 1/29 that showed elevated LFTs with AST 162, ALT 327 and total bili 0.4 and follow up CT abdomen and pelvis on 07/12/2022 was concerning for interval development of intra and extrahepatic biliary ductal dilatation with recommendation for correlation for stent malfunction.  Patient had outpatient appointment with IR scheduled for 2/6, however presented to ED due to becoming symptomatic.  ED course and data review: Low-grade temp of 99.5 in the ED and tachycardic to 110 with otherwise normal vitals.  WBC normal with lactic acid of 1.9.  LFTs a significant bump with AST 881, ALT 767, alk phos 331 and total bilirubin 2.7.  Blood glucose 233.  Potassium 3.3.  EKG, personally reviewed and interpreted with sinus tachycardia nonischemic CT abdomen and pelvis showed stable dilatation of the biliary tree and pancreatic duct with indwelling metallic biliary stent unchanged as further detailed below: IMPRESSION: 1. Stable dilatation of the biliary tree and pancreatic duct, with indwelling metallic biliary stent unchanged. While there is minimal gas in the stent lumen and intrahepatic biliary tree suggesting stent patency, underlying stent dysfunction is suspected given persistent increased caliber of the biliary tree. 2. Stable wall thickening along the medial aspect  of the proximal duodenum, compatible with given history of metastatic breast cancer. 3. Sigmoid diverticulosis without diverticulitis. 4. Significant retained stool throughout the colon consistent with constipation. 5.  Aortic Atherosclerosis (ICD10-I70.0).  2/5: Vitals stable.  IR was consulted and patient will be going for percutaneous transhepatic cholangiogram and drain placement.  Procalcitonin trending up at 2.35 today.  CMP with mild hypokalemia with potassium at 3.4, slight improvement in AST and ALT and worsening of T. bili at 3.9.  CBC stable.  Preliminary blood cultures negative in 12 hours.  Starting her on Zosyn due to worsening procalcitonin.    Assessment and Plan: * Biliary stent obstruction, initial encounter Obstructive jaundice Patient with RUQ pain and nausea, uptrending LFTs CT abdomen and pelvis showing stable dilatation of biliary tree and pancreatic duct compared to CT done on 1/31 Going for percutaneous transhepatic cholangiogram and drainage placement with IR today SCDs for DVT prophylaxis Pain control, IV hydration and IV antiemetics  SIRS (systemic inflammatory response syndrome) (HCC) Possible sepsis Low-grade temp of 99.5 and tachycardic to 110 in the ED but with normal WBC and lactic acid 1.9.  Preliminary blood cultures negative, rising procalcitonin. -IR to send cultures during procedure -Start her on Zosyn    Uncontrolled type 2 diabetes mellitus with hyperglycemia, without long-term current use of insulin (HCC) Blood sugar 233.  Patient not on any hypoglycemic agents Sliding scale coverage  HTN (hypertension) IV hydralazine as needed while n.p.o.  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  Constipation CT abdomen and  pelvis showed significant retained stool throughout the colon consistent with constipation Dulcolax suppositories as needed while n.p.o.  Adult hypothyroidism -Continue home Synthroid    Subjective: Patient was seen and examined today.  Awaiting for her procedure.  Denies any pain.  Physical Exam: Vitals:   07/17/22 1428 07/17/22 1431 07/17/22 1436 07/17/22 1440  BP: (!) 151/85 (!) 154/79 (!) 148/82 (!) 151/90  Pulse: 81 79 86 83  Resp: '14 18 16 16  '$ Temp:      TempSrc:      SpO2: 95% 96% 96% 98%  Weight:      Height:       General.     In no acute distress. Pulmonary.  Lungs clear bilaterally, normal respiratory effort. CV.  Regular rate and rhythm, no JVD, rub or murmur. Abdomen.  Soft, nontender, nondistended, BS positive. CNS.  Alert and oriented .  No focal neurologic deficit. Extremities.  No edema, no cyanosis, pulses intact and symmetrical. Psychiatry.  Judgment and insight appears normal.   Data Reviewed: Prior data reviewed  Family Communication: Discussed with daughter at bedside  Disposition: Status is: Inpatient Remains inpatient appropriate because: Severity of illness  Planned Discharge Destination: Home  Time spent: 50 minutes  This record has been created using Systems analyst. Errors have been sought and corrected,but may not always be located. Such creation errors do not reflect on the standard of care.   Author: Lorella Nimrod, MD 07/17/2022 2:55 PM  For on call review [email or web address].

## 2022-07-17 NOTE — Plan of Care (Signed)

## 2022-07-17 NOTE — Consult Note (Signed)
Hematology/Oncology Consult note [facility] Telephone:([phone] [phone]  Patient Care Team: Virginia Crews, MD as PCP - General (Family Medicine) Theodore Demark, RN (Inactive) as Oncology Nurse Navigator   Name of the patient: Diamond Hinton  [id number]  23-Aug-1955    Reason for consult: History of metastatic breast cancer presenting with cholangitis   Requesting physician: Dr. 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  She required palliative gastrojejunostomy for the same.  She was also found to have obstructive jaundice secondary to duodenal obstruction which required PTCA and subsequently the stent was internalized.  For metastatic breast cancer she is on Ibrance plus letrozole.  Patient was found to have abnormal LFTs in my clinic last week and had obtained a CT chest abdomen and pelvis with contrast.  It showed mild intra and extrahepatic biliary ductal dilatation.  This was discussed with interventional radiology and given that she had no elevation of total bilirubin plan was watchful monitoring.  However a few days later patient presented to the ER with symptoms of abdominal pain and fever with concerns for acute cholangitis.  She was found to have further elevation of LFTs and total bilirubin.  IR was consulted and PTC has been placed again.  Patient reports improvement in her abdominal pain since placement of the PTCA.  She has some discomfort at the site of the PTCA.  ECOG PS- 1  Pain scale- 3   Review of systems- Review of Systems  Constitutional:  Positive for fever and malaise/fatigue.    Allergies  Allergen Reactions   Peanuts [Peanut Oil] Shortness Of Breath and Itching    Patient Active Problem List   Diagnosis Date Noted   Biliary stent obstruction, initial encounter  07/16/2022   SIRS (systemic inflammatory response syndrome) ([place]) 07/16/2022   Palpitations 04/25/2022   Hyperkalemia 11/01/2021   Tumor lysis syndrome 11/01/2021   Severe sepsis ([place]) 10/08/2021   Acute renal failure ([place]) 10/08/2021   Goals of care, counseling/discussion    Palliative care encounter    Hypophosphatemia 09/05/2021   Constipation 09/04/2021   Stenosis of duodenum    Obstructive jaundice 08/29/2021   Primary malignant neoplasm of breast with metastasis ([place]) 08/29/2021   History of cholecystectomy 08/29/2021   Gastric outlet obstruction 08/29/2021   Hypokalemia 08/29/2021   Uncontrolled type 2 diabetes mellitus with hyperglycemia, without long-term current use of insulin ([place]) 08/29/2021   Elevated lipase 08/29/2021   Reflux gastritis 08/04/2021   Diabetes mellitus ([place]) 04/18/2021   HTN (hypertension) 04/18/2021   Hyperlipidemia associated with type 2 diabetes mellitus ([place]) 02/19/2015   Abnormal LFTs 02/19/2015   Adult hypothyroidism 02/19/2015     Past Medical History:  Diagnosis Date   Anemia    Anxiety    Breast cancer ([place])    Gallstones    GERD (gastroesophageal reflux disease)    Hyperlipidemia    Hypertension    Jaundice 08/29/2021   Rash 08/29/2021   Right upper quadrant abdominal pain 08/29/2021   UTI (urinary tract infection) 08/29/2021     Past Surgical History:  Procedure Laterality Date   BILATERAL SALPINGECTOMY  04/03/1997   CHOLECYSTECTOMY N/A 07/07/2015   Procedure: LAPAROSCOPIC CHOLECYSTECTOMY ;  Surgeon: Jules Husbands, MD;  Location: [facility] ORS;  Service: General;  Laterality: N/A;   ESOPHAGOGASTRODUODENOSCOPY  08/30/2021   Procedure: ESOPHAGOGASTRODUODENOSCOPY (EGD);  Surgeon:  Lucilla Lame, MD;  Location: [facility] ENDOSCOPY;  Service: Endoscopy;;   GALLBLADDER SURGERY  07/07/2015   [facility]   IR BILIARY DRAIN PLACEMENT WITH CHOLANGIOGRAM  08/31/2021   IR BILIARY STENT(S) NEW ACCESS WITH DRAIN  02/08/2022   IR CONVERT BILIARY DRAIN TO INT EXT  BILIARY DRAIN  09/02/2021   IR EXCHANGE BILIARY DRAIN  10/10/2021   IR EXCHANGE BILIARY DRAIN  12/05/2021   IR EXCHANGE BILIARY DRAIN  01/19/2022   IR INT EXT BILIARY DRAIN WITH CHOLANGIOGRAM  07/17/2022   IR RADIOLOGIST EVAL & MGMT  03/01/2022    Social History   Socioeconomic History   Marital status: Married    Spouse name: Not on file   Number of children: Not on file   Years of education: Not on file   Highest education level: Not on file  Occupational History   Not on file  Tobacco Use   Smoking status: Never   Smokeless tobacco: Never  Vaping Use   Vaping Use: Never used  Substance and Sexual Activity   Alcohol use: No   Drug use: No   Sexual activity: Yes  Other Topics Concern   Not on file  Social History Narrative   Not on file   Social Determinants of Health   Financial Resource Strain: Low Risk  (11/24/2021)   Overall Financial Resource Strain (CARDIA)    Difficulty of Paying Living Expenses: Not hard at all  Food Insecurity: No Food Insecurity (07/17/2022)   Hunger Vital Sign    Worried About Running Out of Food in the Last Year: Never true    Ran Out of Food in the Last Year: Never true  Transportation Needs: No Transportation Needs (07/17/2022)   PRAPARE - Hydrologist (Medical): No    Lack of Transportation (Non-Medical): No  Physical Activity: Not on file  Stress: No Stress Concern Present (11/24/2021)   [place]    Feeling of Stress : Only a little  Social Connections: Unknown (11/24/2021)   Social Connection and Isolation Panel [NHANES]    Frequency of Communication with Friends and Family: Three times a week    Frequency of Social Gatherings with Friends and Family: Three times a week    Attends Religious Services: Patient refused    Active Member of Clubs or Organizations: Patient refused    Attends Archivist Meetings: Patient refused    Marital  Status: Married  Human resources officer Violence: Not At Risk (07/17/2022)   Humiliation, Afraid, Rape, and Kick questionnaire    Fear of Current or Ex-Partner: No    Emotionally Abused: No    Physically Abused: No    Sexually Abused: No     Family History  Problem Relation Age of Onset   Hypertension Mother    CVA Mother    Ulcers Mother    Emphysema Mother    Heart disease Father    Prostate cancer Father    Macular degeneration Father    Hypertension Sister    Diabetes Sister    Cervical polyp Sister    Hypertension Sister    Diabetes Sister    Lymphoma Sister      Current Facility-Administered Medications:    acetaminophen (TYLENOL) tablet 650 mg, 650 mg, Oral, Q6H PRN, 650 mg at 07/17/22 2111 **OR** acetaminophen (TYLENOL) suppository 650 mg, 650 mg, Rectal, Q6H PRN, Athena Masse, MD, 650 mg at 07/17/22 0553   bisacodyl (DULCOLAX) suppository  10 mg, 10 mg, Rectal, Daily PRN, Athena Masse, MD   hydrALAZINE (APRESOLINE) injection 5 mg, 5 mg, Intravenous, Q4H PRN, Athena Masse, MD   insulin aspart (novoLOG) injection 0-15 Units, 0-15 Units, Subcutaneous, Q4H, Athena Masse, MD, 2 Units at 07/18/22 0031   levothyroxine (SYNTHROID) tablet 88 mcg, 88 mcg, Oral, Daily, Lorella Nimrod, MD, 88 mcg at 07/18/22 0552   morphine (PF) 2 MG/ML injection 2 mg, 2 mg, Intravenous, Q2H PRN, Athena Masse, MD, 2 mg at 07/18/22 0450   ondansetron (ZOFRAN) tablet 4 mg, 4 mg, Oral, Q6H PRN **OR** ondansetron (ZOFRAN) injection 4 mg, 4 mg, Intravenous, Q6H PRN, Athena Masse, MD   piperacillin-tazobactam (ZOSYN) IVPB 3.375 g, 3.375 g, Intravenous, Q8H, Lorin Picket, RPH, Last Rate: 12.5 mL/hr at 07/18/22 0750, 3.375 g at 07/18/22 0750   sodium chloride flush (NS) 0.9 % injection 10 mL, 10 mL, Intracatheter, Q8H, Corrie Mckusick, DO, 10 mL at 07/18/22 0556   Physical exam:  Vitals:   07/17/22 1530 07/17/22 1606 07/17/22 2108 07/18/22 0441  BP: 126/68 121/76 135/79 124/69  Pulse: 67  70 92 73  Resp: '18 18 18 18  '$ Temp:  98 F (36.7 C) (!) 100.6 F (38.1 C) 99 F (37.2 C)  TempSrc:   Oral   SpO2: 93% 95% 92% 95%  Weight:      Height:       Physical Exam Cardiovascular:     Rate and Rhythm: Normal rate and regular rhythm.     Heart sounds: Normal heart sounds.  Pulmonary:     Effort: Pulmonary effort is normal.     Breath sounds: Normal breath sounds.  Abdominal:     General: Bowel sounds are normal.     Palpations: Abdomen is soft.     Comments: Ptca in place  Musculoskeletal:     Cervical back: Normal range of motion.  Skin:    General: Skin is warm and dry.  Neurological:     Mental Status: She is alert and oriented to person, place, and time.           Latest Ref Rng & Units 07/18/2022    2:37 AM  CMP  Glucose 70 - 99 mg/dL 107   BUN 8 - 23 mg/dL 20   Creatinine 0.44 - 1.00 mg/dL 1.20   Sodium 135 - 145 mmol/L 134   Potassium 3.5 - 5.1 mmol/L 3.6   Chloride 98 - 111 mmol/L 103   CO2 22 - 32 mmol/L 23   Calcium 8.9 - 10.3 mg/dL 8.7   Total Protein 6.5 - 8.1 g/dL 6.1   Total Bilirubin 0.3 - 1.2 mg/dL 1.5   Alkaline Phos 38 - 126 U/L 220   AST 15 - 41 U/L 155   ALT 0 - 44 U/L 365       Latest Ref Rng & Units 07/17/2022    5:05 AM  CBC  WBC 4.0 - 10.5 K/uL 8.5   Hemoglobin 12.0 - 15.0 g/dL 12.2   Hematocrit 36.0 - 46.0 % 34.6   Platelets 150 - 400 K/uL 279     '@IMAGES'$ @  IR INT EXT BILIARY DRAIN WITH CHOLANGIOGRAM  Result Date: 07/17/2022 INDICATION: 67 year old female referred for percutaneous transhepatic cholangiogram and possible drainage. The patient has a history of prior bile duct obstruction secondary to breast cancer metastasis into the duodenum. Initial drainage was performed 08/31/2021 and 09/02/2021 EXAM: IMAGE GUIDED PERCUTANEOUS TRANSHEPATIC CHOLANGIOGRAM PLACEMENT OF INTERNAL/EXTERNAL BILIARY DRAIN MEDICATIONS:  2 g Mefoxin; The antibiotic was administered within an appropriate time frame prior to the initiation of the  procedure. ANESTHESIA/SEDATION: Moderate (conscious) sedation was employed during this procedure. A total of Versed 1.0 mg and Fentanyl 50 mcg was administered intravenously by the radiology nurse. 50 mg IV Benadryl Total intra-service moderate Sedation Time: 25 minutes. The patient's level of consciousness and vital signs were monitored continuously by radiology nursing throughout the procedure under my direct supervision. FLUOROSCOPY: Radiation Exposure Index (as provided by the fluoroscopic device): 5 mGy Kerma COMPLICATIONS: None PROCEDURE: The procedure, risks, benefits, and alternatives were explained to the patient and the patient's family. A complete informed consent was performed, with risk benefit analysis. Specific risks that were discussed for the procedure include bleeding, infection, biliary sepsis, IC use day, organ injury, need for further procedure, need for further surgery, long-term drain placement, cardiopulmonary collapse, death. Questions regarding the procedure were encouraged and answered. The patient understands and consents to the procedure. Patient is position in supine position on the fluoroscopy table, and the upper abdomen was prepped and draped in the usual sterile fashion. Maximum barrier sterile technique with sterile gowns and gloves were used for the procedure. A timeout was performed prior to the initiation of the procedure. Local anesthesia was provided with 1% lidocaine with epinephrine. Ultrasound survey of the right liver lobe was performed. Adequate window into the right biliary system was present for the right liver approach. 1% lidocaine was used for local anesthesia, with generous infiltration of the skin and subcutaneous tissues in and inter left costal location. A Chiba needle was advanced under ultrasound guidance into the right liver lobe. Once the tip of the needle was confirmed within the biliary system by injecting small aliquots of contrast, images were stored of  the biliary system after partially opacifying the biliary tree via the needle. 018 wire was advanced centrally. The wire entered and crossed the stent system in a serendipitous manner. The needle was removed, a small incision was made with an 11 blade scalpel, and then a triaxial Accustick system was advanced into the biliary system. The metal stiffener and dilator were removed, we confirmed placement with contrast infusion. Contrast was injected gently, and demonstrated patency of the stent system, as contrast enters the duodenum. There was some debris/ill-defined filling defects within the proximal stent system. 035 J wire was used to navigate across the stent system into the duodenum. Dilation of the subcutaneous tissue tracks was performed with an 10 [place] dilator and 12 [place] dilator, and then a 29 [place] biliary drain was placed as an internal/external biliary drain. Small amount of contrast confirmed location. Drain was sutured in position, attached to gravity drain, and final images stored. The patient tolerated the procedure well and remained hemodynamically stable throughout. No complications were encountered and no significant blood loss was encountered. IMPRESSION: Status post image guided percutaneous transhepatic cholangiogram and internal/external drain placement to gravity. Signed, Dulcy Fanny. Nadene Rubins, RPVI [facility] Electronically Signed   By: Corrie Mckusick D.O.   On: 07/17/2022 15:15   CT Abdomen Pelvis W Contrast  Result Date: 07/16/2022 CLINICAL DATA:  Increasing abdominal pain, nausea, history of metastatic breast cancer EXAM: CT ABDOMEN AND PELVIS WITH CONTRAST TECHNIQUE: Multidetector CT imaging of the abdomen and pelvis was performed using the standard protocol following bolus administration of intravenous contrast. RADIATION DOSE REDUCTION: This exam was performed according to the departmental dose-optimization program  which includes automated exposure control, adjustment of  the mA and/or kV according to patient size and/or use of iterative reconstruction technique. CONTRAST:  190m OMNIPAQUE IOHEXOL 300 MG/ML  SOLN COMPARISON:  07/12/2022 FINDINGS: Lower chest: Hypoventilatory changes at the lung bases. No acute pleural or parenchymal lung disease. Hepatobiliary: There is persistent intrahepatic and extrahepatic biliary duct dilation, with the upstream common bile duct measuring to 18 mm. A metallic stent is identified extending from the mid common bile duct into the duodenal lumen, not appreciably changed since prior exam. A small amount of gas is seen within the lumen of the stent, as well as within the intrahepatic bile ducts, consistent with stent patency. Otherwise the liver is unremarkable. The gallbladder is surgically absent. Pancreas: Stable pancreatic duct dilation. No acute inflammatory changes. Spleen: Normal in size without focal abnormality. Adrenals/Urinary Tract: Adrenal glands are unremarkable. Kidneys are normal, without renal calculi, focal lesion, or hydronephrosis. Bladder is unremarkable. Stomach/Bowel: No bowel obstruction or ileus. Normal appendix right lower quadrant. Diverticulosis of the sigmoid colon without diverticulitis. There is moderate retained stool within the colon. Persistent wall thickening along the medial aspect of the proximal duodenum, unchanged. Postsurgical changes from gastrojejunostomy. Vascular/Lymphatic: Aortic atherosclerosis. Stable subcentimeter retroperitoneal lymph nodes. No pathologic adenopathy. Reproductive: Uterus and bilateral adnexa are unremarkable. Other: No free fluid or free intraperitoneal gas. No abdominal wall hernia. Musculoskeletal: There are no acute or destructive bony lesions. Reconstructed images demonstrate no additional findings. IMPRESSION: 1. Stable dilatation of the biliary tree and pancreatic duct, with indwelling metallic biliary stent unchanged. While  there is minimal gas in the stent lumen and intrahepatic biliary tree suggesting stent patency, underlying stent dysfunction is suspected given persistent increased caliber of the biliary tree. 2. Stable wall thickening along the medial aspect of the proximal duodenum, compatible with given history of metastatic breast cancer. 3. Sigmoid diverticulosis without diverticulitis. 4. Significant retained stool throughout the colon consistent with constipation. 5.  Aortic Atherosclerosis (ICD10-I70.0). Electronically Signed   By: MRanda NgoM.D.   On: 07/16/2022 21:37   CT CHEST ABDOMEN PELVIS W CONTRAST  Result Date: 07/12/2022 CLINICAL DATA:  Breast cancer restaging. * Tracking Code: BO * history of metastatic disease to the duodenum. EXAM: CT CHEST, ABDOMEN, AND PELVIS WITH CONTRAST TECHNIQUE: Multidetector CT imaging of the chest, abdomen and pelvis was performed following the standard protocol during bolus administration of intravenous contrast. RADIATION DOSE REDUCTION: This exam was performed according to the departmental dose-optimization program which includes automated exposure control, adjustment of the mA and/or kV according to patient size and/or use of iterative reconstruction technique. CONTRAST:  1061mOMNIPAQUE IOHEXOL 300 MG/ML  SOLN COMPARISON:  01/25/2022 FINDINGS: CT CHEST FINDINGS Cardiovascular: The heart size is normal. No substantial pericardial effusion. Mild atherosclerotic calcification is noted in the wall of the thoracic aorta. Mediastinum/Nodes: No mediastinal lymphadenopathy. There is no hilar lymphadenopathy. The esophagus has normal imaging features. There is no axillary lymphadenopathy. Lungs/Pleura: No suspicious pulmonary nodule or mass. No focal airspace consolidation. No pleural effusion. Similar appearance of subsegmental atelectasis/linear scarring in the lower lungs bilaterally. Musculoskeletal: No worrisome lytic or sclerotic osseous abnormality. CT ABDOMEN PELVIS  FINDINGS Hepatobiliary: No suspicious focal abnormality within the liver parenchyma. Intra and extrahepatic biliary duct dilatation is new in the interval. Pneumobilia is compatible with the presence of the common bile duct stent device common duct measures up to 18 mm diameter. Common bile duct around the stent in the head of the pancreas is 14 mm diameter. Pancreas: Diffuse pancreatic atrophy noted with dilatation of the main pancreatic  duct, similar to prior. Spleen: No splenomegaly. No focal mass lesion. Adrenals/Urinary Tract: No adrenal nodule or mass. Kidneys unremarkable. No evidence for hydroureter. The urinary bladder appears normal for the degree of distention. Stomach/Bowel: Stomach is unremarkable. No gastric wall thickening. Status post gastrojejunostomy. Wall of the descending duodenum is ill-defined and appears thickened, as before. Stent device noted in the transverse segment of the duodenum. No small bowel wall thickening. No small bowel dilatation. The terminal ileum is normal. No gross colonic mass. No colonic wall thickening. Mild diverticular changes noted sigmoid colon without diverticulitis. Vascular/Lymphatic: There is moderate atherosclerotic calcification of the abdominal aorta without aneurysm. A There is no gastrohepatic or hepatoduodenal ligament lymphadenopathy. No retroperitoneal or mesenteric lymphadenopathy. No pelvic sidewall lymphadenopathy. Reproductive: Unremarkable. Other: No intraperitoneal free fluid. Musculoskeletal: No worrisome lytic or sclerotic osseous abnormality. IMPRESSION: 1. Interval development of intra and extrahepatic biliary duct dilatation with common bile duct stent device in place. Correlation for stent malfunction recommended. Gas in the biliary tree is presumably secondary to the presence of the stent device. 2. Wall of the descending duodenum is ill-defined and transverse segment appears thickened, as before. 3. Status post gastrojejunostomy. 4. No  evidence for new metastatic disease in the chest, abdomen, or pelvis. 5.  Aortic Atherosclerosis (ICD10-I70.0). Electronically Signed   By: Misty Stanley M.D.   On: 07/12/2022 10:54   DG Bone Density  Result Date: 07/04/2022 EXAM: DUAL X-RAY ABSORPTIOMETRY (DXA) FOR BONE MINERAL DENSITY IMPRESSION: Dear Dr Janese Banks, Your patient SANTIAGO GRAF completed a FRAX assessment on 07/04/2022 using the [place] (analysis version: 14.10) manufactured by [employer]. The following summarizes the results of our evaluation. PATIENT BIOGRAPHICAL: Name: Shanvi, Moyd Patient ID: [id number] Birth Date: 1956-04-25 Height:    62.0 in. Gender:     Female    [age]       Weight:    140.2 lbs. Ethnicity:  White                            Exam Date: 07/04/2022 FRAX* RESULTS:  (version: 3.5) 10-year Probability of Fracture1 Major Osteoporotic Fracture2 Hip Fracture 13.2% 0.9% Population: [place] (Caucasian) Risk Factors: History of Fracture (Adult) Based on Femur (Left) Neck BMD 1 -The 10-year probability of fracture may be lower than reported if the patient has received treatment. 2 -Major Osteoporotic Fracture: Clinical Spine, Forearm, Hip or Shoulder *FRAX is a Materials engineer of the [employer] for Metabolic Bone Disease, a [place] (WHO) [facility]. ASSESSMENT: The probability of a major osteoporotic fracture is 13.2% within the next ten years. The probability of a hip fracture is 0.9% within the next ten years. . Your patient Japji Kok completed a BMD test on 07/04/2022 using the [place] (software version: 14.10) manufactured by [employer]. The following summarizes the results of our evaluation. Technologist: mtb PATIENT BIOGRAPHICAL: Name: Natassja, Ollis Patient ID: [id number] Birth Date: March 03, 1956 Height: 62.0 in. Gender: Female Exam Date: 07/04/2022 Weight: 140.2 lbs. Indications: Breast CA, Caucasian, Hypothyroid,  Postmenopausal, History of Fracture (Adult) Fractures: Wrist Right Treatments: Calcium, Vitamin D DENSITOMETRY RESULTS: Site         Region     Measured Date Measured Age WHO Classification Young Adult T-score BMD         %Change vs. Previous Significant Change (*) AP Spine L1-L2 07/04/2020 68.4 Osteopenia -  1.1 1.042 g/cm2 - - DualFemur Neck Left 07/04/2022 68.4 Normal -0.7 0.942 g/cm2 - - DualFemur Total Mean 07/04/2022 68.4 Normal -0.4 0.952 g/cm2 - - Left Forearm Radius 33% 07/04/2022 68.4 Normal -0.7 0.814 g/cm2 - - ASSESSMENT: The BMD measured at AP Spine L1-L2 is 1.042 g/cm2 with a T-score of -1.1. This patient is considered osteopenic according to [facility]) criteria. The scan quality is good. L-3 and L-4 were excluded due to degenerative changes. World Pharmacologist [facility]) criteria for post-menopausal, Caucasian Women: Normal:                   T-score at or above -1 SD Osteopenia/low bone mass: T-score between -1 and -2.5 SD Osteoporosis:             T-score at or below -2.5 SD RECOMMENDATIONS: 1. All patients should optimize calcium and vitamin D intake. 2. Consider FDA-approved medical therapies in postmenopausal women and men aged 59 years and older, based on the following: a. A hip or vertebral(clinical or morphometric) fracture b. T-score < -2.5 at the femoral neck or spine after appropriate evaluation to exclude secondary causes c. Low bone mass (T-score between -1.0 and -2.5 at the femoral neck or spine) and a 10-year probability of a hip fracture > 3% or a 10-year probability of a major osteoporosis-related fracture > 20% based on the [place]-adapted WHO algorithm 3. Clinician judgment and/or patient preferences may indicate treatment for people with 10-year fracture probabilities above or below these levels FOLLOW-UP: People with diagnosed cases of osteoporosis or at high risk for fracture should have regular bone mineral density tests. For patients eligible for Medicare,  routine testing is allowed once every 2 years. The testing frequency can be increased to one year for patients who have rapidly progressing disease, those who are receiving or discontinuing medical therapy to restore bone mass, or have additional risk factors. I have reviewed this report, and agree with the above findings. [facility] Electronically Signed   By: Zerita Boers M.D.   On: 07/04/2022 13:15    Assessment and plan- Patient is a 67 y.o. female with history of metastatic breast cancer with duodenal obstruction s/p PTCA in the past followed by stent internalization presenting with acute cholangitis  Acute cholangitis: Secondary to stent malfunction.  She is currently on IV antibiotics and fever curve is coming down.  Patient was evaluated by IR today and underwent placement of a PTCA to relieve the blockage.  Hopefully her LFTs will come down over the next few days.  Metastatic breast cancer: Hold Ibrance for the next 1 to 2 weeks while acute cholangitis is being treated.  Okay to continue letrozole   Visit Diagnosis 1. Right upper quadrant abdominal pain   2. Elevated liver function tests     Dr. Randa Evens, MD, MPH [facility] at [facility] [phone] 07/18/2022

## 2022-07-18 DIAGNOSIS — E1165 Type 2 diabetes mellitus with hyperglycemia: Secondary | ICD-10-CM | POA: Diagnosis not present

## 2022-07-18 DIAGNOSIS — R6510 Systemic inflammatory response syndrome (SIRS) of non-infectious origin without acute organ dysfunction: Secondary | ICD-10-CM | POA: Diagnosis not present

## 2022-07-18 DIAGNOSIS — K831 Obstruction of bile duct: Secondary | ICD-10-CM | POA: Diagnosis not present

## 2022-07-18 DIAGNOSIS — T85590A Other mechanical complication of bile duct prosthesis, initial encounter: Secondary | ICD-10-CM | POA: Diagnosis not present

## 2022-07-18 LAB — COMPREHENSIVE METABOLIC PANEL
ALT: 365 U/L — ABNORMAL HIGH (ref 0–44)
AST: 155 U/L — ABNORMAL HIGH (ref 15–41)
Albumin: 3.1 g/dL — ABNORMAL LOW (ref 3.5–5.0)
Alkaline Phosphatase: 220 U/L — ABNORMAL HIGH (ref 38–126)
Anion gap: 8 (ref 5–15)
BUN: 20 mg/dL (ref 8–23)
CO2: 23 mmol/L (ref 22–32)
Calcium: 8.7 mg/dL — ABNORMAL LOW (ref 8.9–10.3)
Chloride: 103 mmol/L (ref 98–111)
Creatinine, Ser: 1.2 mg/dL — ABNORMAL HIGH (ref 0.44–1.00)
GFR, Estimated: 49 mL/min — ABNORMAL LOW (ref >60)
Glucose, Bld: 107 mg/dL — ABNORMAL HIGH (ref 70–99)
Potassium: 3.6 mmol/L (ref 3.5–5.1)
Sodium: 134 mmol/L — ABNORMAL LOW (ref 135–145)
Total Bilirubin: 1.5 mg/dL — ABNORMAL HIGH (ref 0.3–1.2)
Total Protein: 6.1 g/dL — ABNORMAL LOW (ref 6.5–8.1)

## 2022-07-18 LAB — GLUCOSE, CAPILLARY
Glucose-Capillary: 105 mg/dL — ABNORMAL HIGH (ref 70–99)
Glucose-Capillary: 132 mg/dL — ABNORMAL HIGH (ref 70–99)
Glucose-Capillary: 168 mg/dL — ABNORMAL HIGH (ref 70–99)
Glucose-Capillary: 215 mg/dL — ABNORMAL HIGH (ref 70–99)

## 2022-07-18 LAB — PROCALCITONIN: Procalcitonin: 0.67 ng/mL

## 2022-07-18 MED ORDER — INSULIN ASPART 100 UNIT/ML IJ SOLN
0.0000 [IU] | Freq: Every day | INTRAMUSCULAR | Status: DC
  Administered 2022-07-18: 2 [IU] via SUBCUTANEOUS
  Filled 2022-07-18: qty 1

## 2022-07-18 MED ORDER — LETROZOLE 2.5 MG PO TABS
2.5000 mg | ORAL_TABLET | Freq: Every day | ORAL | Status: DC
  Administered 2022-07-18 – 2022-07-19 (×2): 2.5 mg via ORAL
  Filled 2022-07-18 (×2): qty 1

## 2022-07-18 MED ORDER — INSULIN ASPART 100 UNIT/ML IJ SOLN
0.0000 [IU] | Freq: Three times a day (TID) | INTRAMUSCULAR | Status: DC
  Administered 2022-07-19: 2 [IU] via SUBCUTANEOUS
  Filled 2022-07-18: qty 1

## 2022-07-18 NOTE — Assessment & Plan Note (Signed)
Resolved with repletion. ?

## 2022-07-18 NOTE — Assessment & Plan Note (Signed)
Possible sepsis Low-grade temp of 99.5 and tachycardic to 110 in the ED but with normal WBC and lactic acid 1.9.  Preliminary blood cultures negative, rising procalcitonin.  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  Procalcitonin started improving after starting Zosyn -Continue with Zosyn-we can discharge on Augmentin once pain improved

## 2022-07-18 NOTE — Progress Notes (Signed)
Progress Note   Patient: Diamond Hinton CXK:[id number] DOB: [birth year] DOA: 07/16/2022     1 DOS: the patient was seen and examined on 07/18/2022   Brief hospital course: Taken from H&P.  Diamond Hinton is a 67 y.o. 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  Patient had routine labs on 1/29 that showed elevated LFTs with AST 162, ALT 327 and total bili 0.4 and follow up CT abdomen and pelvis on 07/12/2022 was concerning for interval development of intra and extrahepatic biliary ductal dilatation with recommendation for correlation for stent malfunction.  Patient had outpatient appointment with IR scheduled for 2/6, however presented to ED due to becoming symptomatic.  ED course and data review: Low-grade temp of 99.5 in the ED and tachycardic to 110 with otherwise normal vitals.  WBC normal with lactic acid of 1.9.  LFTs a significant bump with AST 881, ALT 767, alk phos 331 and total bilirubin 2.7.  Blood glucose 233.  Potassium 3.3.  EKG, personally reviewed and interpreted with sinus tachycardia nonischemic CT abdomen and pelvis showed stable dilatation of the biliary tree and pancreatic duct with indwelling metallic biliary stent unchanged as further detailed below: IMPRESSION: 1. Stable dilatation of the biliary tree and pancreatic duct, with indwelling metallic biliary stent unchanged. While there is minimal gas in the stent lumen and intrahepatic biliary tree suggesting stent patency, underlying stent dysfunction is suspected given persistent increased caliber of the biliary tree. 2. Stable wall thickening along the medial aspect  of the proximal duodenum, compatible with given history of metastatic breast cancer. 3. Sigmoid diverticulosis without diverticulitis. 4. Significant retained stool throughout the colon consistent with constipation. 5.  Aortic Atherosclerosis (ICD10-I70.0).  2/5: Vitals stable.  IR was consulted and patient will be going for percutaneous transhepatic cholangiogram and drain placement.  Procalcitonin trending up at 2.35 today.  CMP with mild hypokalemia with potassium at 3.4, slight improvement in AST and ALT and worsening of T. bili at 3.9.  CBC stable.  Preliminary blood cultures negative in 12 hours.  Starting her on Zosyn due to worsening procalcitonin.  2/6: Vital stable.  Maximum temperature recorded was 100.6 over the past 24 hours.  Transaminitis and T. bili improving after having percutaneous transhepatic drain placement.  Procalcitonin improving.  Blood cultures remain negative. Oncology is recommending holding Ibrance for the next 1 to 2 weeks while acute cholangitis is being treated.  She will continue letrozole. Continue to have significant pain at drain site.  We will keep for another day for pain control.   Assessment and Plan: * Biliary stent obstruction, initial encounter Obstructive jaundice Patient with RUQ pain and nausea, uptrending LFTs CT abdomen and pelvis showing stable dilatation of biliary tree and pancreatic duct compared to CT done on 1/31 S/p percutaneous transhepatic cholangiogram and drainage placement with IR on 2/5 SCDs for DVT prophylaxis Pain control, IV hydration and IV antiemetics  SIRS (systemic inflammatory response syndrome) (HCC) Possible sepsis Low-grade temp of 99.5 and tachycardic to 110 in the ED but with normal WBC and lactic acid 1.9.  Preliminary blood cultures negative, rising procalcitonin.  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  Procalcitonin started improving  after starting Zosyn -Continue with Zosyn-we can  discharge on Augmentin once pain improved   Uncontrolled type 2 diabetes mellitus with hyperglycemia, without long-term current use of insulin (HCC) Blood sugar 233.  Patient not on any hypoglycemic agents Sliding scale coverage  HTN (hypertension) IV hydralazine as needed while n.p.o.  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  Constipation CT abdomen and pelvis showed significant retained stool throughout the colon consistent with constipation Dulcolax suppositories as needed while n.p.o.  Adult hypothyroidism -Continue home Synthroid    Subjective: Patient was seen and examined today.  Continues to have significant pain at the new drain site.  No nausea or vomiting.  Physical Exam: Vitals:   07/17/22 1530 07/17/22 1606 07/17/22 2108 07/18/22 0441  BP: 126/68 121/76 135/79 124/69  Pulse: 67 70 92 73  Resp: '18 18 18 18  '$ Temp:  98 F (36.7 C) (!) 100.6 F (38.1 C) 99 F (37.2 C)  TempSrc:   Oral   SpO2: 93% 95% 92% 95%  Weight:      Height:       General.  Frail lady, in no acute distress. Pulmonary.  Lungs clear bilaterally, normal respiratory effort. CV.  Regular rate and rhythm, no JVD, rub or murmur. Abdomen.  Soft, nontender, nondistended, BS positive.RUQ drain with significant discharge. CNS.  Alert and oriented .  No focal neurologic deficit. Extremities.  No edema, no cyanosis, pulses intact and symmetrical. Psychiatry.  Judgment and insight appears normal.   Data Reviewed: Prior data reviewed  Family Communication: Discussed with patient  Disposition: Status is: Inpatient Remains inpatient appropriate because: Severity of illness  Planned Discharge Destination: Home  Time spent: 45 minutes  This record has been created using  Systems analyst. Errors have been sought and corrected,but may not always be located. Such creation errors do not reflect on the standard of care.   Author: Lorella Nimrod, MD 07/18/2022 1:50 PM  For on call review [email or web address].

## 2022-07-18 NOTE — Assessment & Plan Note (Signed)
Obstructive jaundice Patient with RUQ pain and nausea, uptrending LFTs CT abdomen and pelvis showing stable dilatation of biliary tree and pancreatic duct compared to CT done on 1/31 S/p percutaneous transhepatic cholangiogram and drainage placement with IR on 2/5 SCDs for DVT prophylaxis Pain control, IV hydration and IV antiemetics

## 2022-07-19 DIAGNOSIS — E1165 Type 2 diabetes mellitus with hyperglycemia: Secondary | ICD-10-CM | POA: Diagnosis not present

## 2022-07-19 DIAGNOSIS — C50912 Malignant neoplasm of unspecified site of left female breast: Secondary | ICD-10-CM | POA: Diagnosis not present

## 2022-07-19 DIAGNOSIS — E039 Hypothyroidism, unspecified: Secondary | ICD-10-CM | POA: Diagnosis not present

## 2022-07-19 DIAGNOSIS — T85590A Other mechanical complication of bile duct prosthesis, initial encounter: Secondary | ICD-10-CM | POA: Diagnosis not present

## 2022-07-19 DIAGNOSIS — R1011 Right upper quadrant pain: Secondary | ICD-10-CM

## 2022-07-19 DIAGNOSIS — I10 Essential (primary) hypertension: Secondary | ICD-10-CM | POA: Diagnosis not present

## 2022-07-19 DIAGNOSIS — K831 Obstruction of bile duct: Secondary | ICD-10-CM | POA: Diagnosis not present

## 2022-07-19 DIAGNOSIS — R7989 Other specified abnormal findings of blood chemistry: Secondary | ICD-10-CM

## 2022-07-19 DIAGNOSIS — R6510 Systemic inflammatory response syndrome (SIRS) of non-infectious origin without acute organ dysfunction: Secondary | ICD-10-CM | POA: Diagnosis not present

## 2022-07-19 DIAGNOSIS — C784 Secondary malignant neoplasm of small intestine: Secondary | ICD-10-CM | POA: Diagnosis not present

## 2022-07-19 LAB — GLUCOSE, CAPILLARY
Glucose-Capillary: 156 mg/dL — ABNORMAL HIGH (ref 70–99)
Glucose-Capillary: 169 mg/dL — ABNORMAL HIGH (ref 70–99)

## 2022-07-19 MED ORDER — MORPHINE SULFATE ER 15 MG PO TBCR: 15.0000 mg | EXTENDED_RELEASE_TABLET | Freq: Two times a day (BID) | ORAL | 0 refills | Status: AC

## 2022-07-19 MED ORDER — PALBOCICLIB 100 MG PO TABS: 100.0000 mg | ORAL_TABLET | Freq: Every day | ORAL | 3 refills | Status: DC

## 2022-07-19 MED ORDER — OXYCODONE HCL 5 MG PO TABS: 5.0000 mg | ORAL_TABLET | ORAL | 0 refills | Status: DC | PRN

## 2022-07-19 MED ORDER — AMOXICILLIN-POT CLAVULANATE 875-125 MG PO TABS: 1.0000 | ORAL_TABLET | Freq: Two times a day (BID) | ORAL | 0 refills | Status: DC

## 2022-07-19 NOTE — TOC Transition Note (Signed)
Transition of Care [facility]) - CM/SW Discharge Note   Patient Details  Name: Diamond Hinton MRN: [id number] Date of Birth: [birth year]  [facility] CM/SW Contact:  Candie Chroman, LCSW Phone Number: 07/19/2022, 10:57 AM   Clinical Narrative:   Patient has orders to discharge home today. Readmission prevention screen complete. CSW met with patient. No supports at bedside. CSW introduced role and explained that discharge planning would be discussed. PCP is Lavon Paganini, MD. Patient drives herself to appointments. Pharmacy is [facility] on [address]. No issues obtaining medications. Patient lives home with husband. No home health or DME use prior to admission. Patient is requesting that we send her home with enough flushes until she can get some from medical supply store. RN is aware. Daughter Anderson Malta will transport her home today. No further concerns. CSW signing off.  Final next level of care: Home/Self Care Barriers to Discharge: No Barriers Identified   Patient Goals and CMS Choice      Discharge Placement                  Patient to be transferred to facility by: Daughter   Patient and family notified of of transfer: 07/19/22  Discharge Plan and Services Additional resources added to the After Visit Summary for                                       Social Determinants of Health (SDOH) Interventions SDOH Screenings   Food Insecurity: No Food Insecurity (07/17/2022)  Housing: Low Risk  (07/17/2022)  Transportation Needs: No Transportation Needs (07/17/2022)  Utilities: Not At Risk (07/17/2022)  Alcohol Screen: Low Risk  (04/20/2022)  Depression (PHQ2-9): Low Risk  (04/20/2022)  Financial Resource Strain: Low Risk  (11/24/2021)  Social Connections: Unknown (11/24/2021)  Stress: No Stress Concern Present (11/24/2021)  Tobacco Use: Low Risk  (07/17/2022)     Readmission Risk Interventions    07/19/2022   10:56 AM 10/08/2021    2:08 PM  Readmission Risk  Prevention Plan  Transportation Screening Complete Complete  PCP or Specialist Appt within 5-7 Days Complete   PCP or Specialist Appt within 3-5 Days  Complete  Medication Review (RN CM) Complete   HRI or [place]  Complete  Social Work Consult for [place] Planning/Counseling  Complete  Palliative Care Screening  Complete  Medication Review Press photographer)  Complete

## 2022-07-19 NOTE — Discharge Summary (Signed)
Physician Discharge Summary   Patient: Diamond Hinton MRN: [id number] DOB: September 09, 1955  Admit date:     07/16/2022  Discharge date: 07/19/22  Discharge Physician: Lorella Nimrod   PCP: Virginia Crews, MD   Recommendations at discharge:  Please obtain CBC and CMP in 1 week Follow-up in drain clinic Follow-up with primary care provider Follow-up with oncology  Discharge Diagnoses: Principal Problem:   Biliary stent obstruction, initial encounter Active Problems:   Elevated liver function tests   Obstructive jaundice   SIRS (systemic inflammatory response syndrome) ([place])   Uncontrolled type 2 diabetes mellitus with hyperglycemia, without long-term current use of insulin (HCC)   HTN (hypertension)   Hypokalemia   Primary malignant neoplasm of breast with metastasis ([place])   Adult hypothyroidism   Right upper quadrant abdominal pain   Constipation   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)   Hospital Course: Taken from H&P.  Diamond Hinton is a 67 y.o. 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  Patient had routine labs on 1/29 that showed elevated LFTs with AST 162, ALT 327 and total bili 0.4 and follow up CT abdomen and pelvis on 07/12/2022 was concerning for interval development of intra and extrahepatic biliary ductal dilatation with recommendation for correlation for stent malfunction.  Patient had outpatient appointment with IR scheduled for 2/6, however presented to ED due to becoming symptomatic.  ED course and data review: Low-grade temp of 99.5 in the ED and tachycardic to 110 with otherwise  normal vitals.  WBC normal with lactic acid of 1.9.  LFTs a significant bump with AST 881, ALT 767, alk phos 331 and total bilirubin 2.7.  Blood glucose 233.  Potassium 3.3.  EKG, personally reviewed and interpreted with sinus tachycardia nonischemic CT abdomen and pelvis showed stable dilatation of the biliary tree and pancreatic duct with indwelling metallic biliary stent unchanged as further detailed below: IMPRESSION: 1. Stable dilatation of the biliary tree and pancreatic duct, with indwelling metallic biliary stent unchanged. While there is minimal gas in the stent lumen and intrahepatic biliary tree suggesting stent patency, underlying stent dysfunction is suspected given persistent increased caliber of the biliary tree. 2. Stable wall thickening along the medial aspect of the proximal duodenum, compatible with given history of metastatic breast cancer. 3. Sigmoid diverticulosis without diverticulitis. 4. Significant retained stool throughout the colon consistent with constipation. 5.  Aortic Atherosclerosis (ICD10-I70.0).  2/5: Vitals stable.  IR was consulted and patient will be going for percutaneous transhepatic cholangiogram and drain placement.  Procalcitonin trending up at 2.35 today.  CMP with mild hypokalemia with potassium at 3.4, slight improvement in AST and ALT and worsening of T. bili at 3.9.  CBC stable.  Preliminary blood cultures negative in 12 hours.  Starting her on Zosyn due to worsening procalcitonin.  2/6: Vital stable.  Maximum temperature recorded was 100.6 over the past 24 hours.  Transaminitis and T. bili improving after having percutaneous transhepatic drain placement.  Procalcitonin improving.  Blood cultures remain negative. Oncology is recommending holding Ibrance for the next 1 to 2 weeks while acute cholangitis is being treated.  She will continue letrozole. Continue to have significant pain  at drain site.  We will keep for another day for pain  control.  2/7: Hemodynamically stable.  Some improvement in pain but continued to have some with ambulation.  She was given few days of MS Contin and oxycodone to use as needed.  She was advised to avoid acetaminophen until her liver functions completely normalized. She is also being given 3 days of Augmentin to complete a 5-day course for concern of cholangitis.  Patient will continue her cancer medications called letrozole and will hold Ibrance as advised by oncology and she will follow-up closely with her oncologist for further recommendations.  She will continue with rest of her home medications and need to have a follow-up at renal clinic and with her providers.  Assessment and Plan: * Biliary stent obstruction, initial encounter Obstructive jaundice Patient with RUQ pain and nausea, uptrending LFTs CT abdomen and pelvis showing stable dilatation of biliary tree and pancreatic duct compared to CT done on 1/31 S/p percutaneous transhepatic cholangiogram and drainage placement with IR on 2/5 SCDs for DVT prophylaxis Pain control, IV hydration and IV antiemetics  SIRS (systemic inflammatory response syndrome) (HCC) Possible sepsis Low-grade temp of 99.5 and tachycardic to 110 in the ED but with normal WBC and lactic acid 1.9.  Preliminary blood cultures negative, rising procalcitonin.  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  Procalcitonin started improving after starting Zosyn -Continue with Zosyn-we can discharge on Augmentin once pain improved   Uncontrolled type 2 diabetes mellitus with hyperglycemia, without long-term current use of insulin (HCC) Blood sugar 233.  Patient not on any hypoglycemic agents Sliding scale coverage  HTN (hypertension) IV hydralazine as needed while n.p.o.  Hypokalemia Resolved with repletion  Primary malignant neoplasm of breast with metastasis ([name]) Breast cancer metastatic to intestine s/p internalize  gastrojejunostomy and biliary stent On Ibrance and Femara by her oncologist, Dr Janese Banks who she last saw on 05/21/2022.  Constipation CT abdomen and pelvis showed significant retained stool throughout the colon consistent with constipation Dulcolax suppositories as needed while n.p.o.  Adult hypothyroidism -Continue home Synthroid   Pain control - [place] Controlled Substance Reporting System database was reviewed. and patient was instructed, not to drive, operate heavy machinery, perform activities at heights, swimming or participation in water activities or provide baby-sitting services while on Pain, Sleep and Anxiety Medications; until their outpatient Physician has advised to do so again. Also recommended to not to take more than prescribed Pain, Sleep and Anxiety Medications.  Consultants: IR, oncology Procedures performed: Percutaneous transhepatic biliary drain placement by IR Disposition: Home Diet recommendation:  Discharge Diet Orders (From admission, onward)     Start     Ordered   07/19/22 0000  Diet - low sodium heart healthy        07/19/22 1041           Cardiac diet DISCHARGE MEDICATION: Allergies as of 07/19/2022       Reactions   Peanuts [peanut Oil] Shortness Of Breath, Itching        Medication List     STOP taking these medications    baclofen 10 MG tablet Commonly known as: LIORESAL   GAS-X PO   pantoprazole 40 MG tablet Commonly known as: PROTONIX   traZODone 50 MG tablet Commonly known as: DESYREL       TAKE these medications    acetaminophen 325 MG tablet Commonly known as: TYLENOL Take 325 mg by mouth every 6 (six) hours as needed (for pain and  sometimes takes 2 if pain is worse).   amoxicillin-clavulanate 875-125 MG tablet Commonly known as: AUGMENTIN Take 1 tablet by mouth 2 (two) times daily for 3 days.   CENTRUM VITAMINTS PO Take 1 tablet by mouth daily.   hydrochlorothiazide 25 MG tablet Commonly known as:  HYDRODIURIL Take 1 tablet (25 mg total) by mouth daily.   letrozole 2.5 MG tablet Commonly known as: FEMARA Take 1 tablet (2.5 mg total) by mouth daily.   levothyroxine 88 MCG tablet Commonly known as: SYNTHROID Take 1 tablet by mouth once daily   morphine 15 MG 12 hr tablet Commonly known as: MS Contin Take 1 tablet (15 mg total) by mouth every 12 (twelve) hours for 5 days.   ondansetron 4 MG tablet Commonly known as: Zofran Take 1 tablet (4 mg total) by mouth daily as needed for nausea or vomiting.   oxyCODONE 5 MG immediate release tablet Commonly known as: Roxicodone Take 1 tablet (5 mg total) by mouth every 4 (four) hours as needed for severe pain.   palbociclib 100 MG tablet Commonly known as: Ibrance Take 1 tablet (100 mg total) by mouth daily. Take for 21 days on, 7 days off, repeat every 28 days. Hold until you see your oncologist What changed: additional instructions   prochlorperazine 5 MG tablet Commonly known as: COMPAZINE Take 1 tablet (5 mg total) by mouth every 6 (six) hours as needed for refractory nausea / vomiting.               Discharge Care Instructions  (From admission, onward)           Start     Ordered   07/19/22 0000  Leave dressing on - Keep it clean, dry, and intact until clinic visit        07/19/22 1041            Follow-up Information     Virginia Crews, MD. Go on 07/24/2022.   Specialty: Family Medicine Why: Appointment on 2/12 @ 10:20 am. Contact information: [address] [place] [address] [place] [address] [phone]                Discharge Exam: Danley Danker Weights   07/16/22 1941 07/17/22 1328  Weight: 63.5 kg 63.5 kg   General.     In no acute distress. Pulmonary.  Lungs clear bilaterally, normal respiratory effort. CV.  Regular rate and rhythm, no JVD, rub or murmur. Abdomen.  Soft, nontender, nondistended, BS positive.  RUQ drain in place CNS.  Alert and oriented .  No focal neurologic  deficit. Extremities.  No edema, no cyanosis, pulses intact and symmetrical. Psychiatry.  Judgment and insight appears normal.   Condition at discharge: stable  The results of significant diagnostics from this hospitalization (including imaging, microbiology, ancillary and laboratory) are listed below for reference.   Imaging Studies: IR INT EXT BILIARY DRAIN WITH CHOLANGIOGRAM  Result Date: 07/17/2022 INDICATION: 67 year old female referred for percutaneous transhepatic cholangiogram and possible drainage. The patient has a history of prior bile duct obstruction secondary to breast cancer metastasis into the duodenum. Initial drainage was performed 08/31/2021 and 09/02/2021 EXAM: IMAGE GUIDED PERCUTANEOUS TRANSHEPATIC CHOLANGIOGRAM PLACEMENT OF INTERNAL/EXTERNAL BILIARY DRAIN MEDICATIONS: 2 g Mefoxin; The antibiotic was administered within an appropriate time frame prior to the initiation of the procedure. ANESTHESIA/SEDATION: Moderate (conscious) sedation was employed during this procedure. A total of Versed 1.0 mg and Fentanyl 50 mcg was administered intravenously by the radiology nurse. 50 mg IV Benadryl Total  intra-service moderate Sedation Time: 25 minutes. The patient's level of consciousness and vital signs were monitored continuously by radiology nursing throughout the procedure under my direct supervision. FLUOROSCOPY: Radiation Exposure Index (as provided by the fluoroscopic device): 5 mGy Kerma COMPLICATIONS: None PROCEDURE: The procedure, risks, benefits, and alternatives were explained to the patient and the patient's family. A complete informed consent was performed, with risk benefit analysis. Specific risks that were discussed for the procedure include bleeding, infection, biliary sepsis, IC use day, organ injury, need for further procedure, need for further surgery, long-term drain placement, cardiopulmonary collapse, death. Questions regarding the procedure were encouraged and answered.  The patient understands and consents to the procedure. Patient is position in supine position on the fluoroscopy table, and the upper abdomen was prepped and draped in the usual sterile fashion. Maximum barrier sterile technique with sterile gowns and gloves were used for the procedure. A timeout was performed prior to the initiation of the procedure. Local anesthesia was provided with 1% lidocaine with epinephrine. Ultrasound survey of the right liver lobe was performed. Adequate window into the right biliary system was present for the right liver approach. 1% lidocaine was used for local anesthesia, with generous infiltration of the skin and subcutaneous tissues in and inter left costal location. A Chiba needle was advanced under ultrasound guidance into the right liver lobe. Once the tip of the needle was confirmed within the biliary system by injecting small aliquots of contrast, images were stored of the biliary system after partially opacifying the biliary tree via the needle. 018 wire was advanced centrally. The wire entered and crossed the stent system in a serendipitous manner. The needle was removed, a small incision was made with an 11 blade scalpel, and then a triaxial Accustick system was advanced into the biliary system. The metal stiffener and dilator were removed, we confirmed placement with contrast infusion. Contrast was injected gently, and demonstrated patency of the stent system, as contrast enters the duodenum. There was some debris/ill-defined filling defects within the proximal stent system. 035 J wire was used to navigate across the stent system into the duodenum. Dilation of the subcutaneous tissue tracks was performed with an 10 [place] dilator and 12 [place] dilator, and then a 61 [place] biliary drain was placed as an internal/external biliary drain. Small amount of contrast confirmed location. Drain was sutured in position, attached to gravity drain, and final images stored. The patient  tolerated the procedure well and remained hemodynamically stable throughout. No complications were encountered and no significant blood loss was encountered. IMPRESSION: Status post image guided percutaneous transhepatic cholangiogram and internal/external drain placement to gravity. Signed, Dulcy Fanny. Nadene Rubins, RPVI [facility] Radiology Electronically Signed   By: Corrie Mckusick D.O.   On: 07/17/2022 15:15   CT Abdomen Pelvis W Contrast  Result Date: 07/16/2022 CLINICAL DATA:  Increasing abdominal pain, nausea, history of metastatic breast cancer EXAM: CT ABDOMEN AND PELVIS WITH CONTRAST TECHNIQUE: Multidetector CT imaging of the abdomen and pelvis was performed using the standard protocol following bolus administration of intravenous contrast. RADIATION DOSE REDUCTION: This exam was performed according to the departmental dose-optimization program which includes automated exposure control, adjustment of the mA and/or kV according to patient size and/or use of iterative reconstruction technique. CONTRAST:  112m OMNIPAQUE IOHEXOL 300 MG/ML  SOLN COMPARISON:  07/12/2022 FINDINGS: Lower chest: Hypoventilatory changes at the lung bases. No acute pleural or parenchymal lung disease. Hepatobiliary: There is persistent intrahepatic and extrahepatic biliary duct  dilation, with the upstream common bile duct measuring to 18 mm. A metallic stent is identified extending from the mid common bile duct into the duodenal lumen, not appreciably changed since prior exam. A small amount of gas is seen within the lumen of the stent, as well as within the intrahepatic bile ducts, consistent with stent patency. Otherwise the liver is unremarkable. The gallbladder is surgically absent. Pancreas: Stable pancreatic duct dilation. No acute inflammatory changes. Spleen: Normal in size without focal abnormality. Adrenals/Urinary Tract: Adrenal glands are unremarkable. Kidneys are  normal, without renal calculi, focal lesion, or hydronephrosis. Bladder is unremarkable. Stomach/Bowel: No bowel obstruction or ileus. Normal appendix right lower quadrant. Diverticulosis of the sigmoid colon without diverticulitis. There is moderate retained stool within the colon. Persistent wall thickening along the medial aspect of the proximal duodenum, unchanged. Postsurgical changes from gastrojejunostomy. Vascular/Lymphatic: Aortic atherosclerosis. Stable subcentimeter retroperitoneal lymph nodes. No pathologic adenopathy. Reproductive: Uterus and bilateral adnexa are unremarkable. Other: No free fluid or free intraperitoneal gas. No abdominal wall hernia. Musculoskeletal: There are no acute or destructive bony lesions. Reconstructed images demonstrate no additional findings. IMPRESSION: 1. Stable dilatation of the biliary tree and pancreatic duct, with indwelling metallic biliary stent unchanged. While there is minimal gas in the stent lumen and intrahepatic biliary tree suggesting stent patency, underlying stent dysfunction is suspected given persistent increased caliber of the biliary tree. 2. Stable wall thickening along the medial aspect of the proximal duodenum, compatible with given history of metastatic breast cancer. 3. Sigmoid diverticulosis without diverticulitis. 4. Significant retained stool throughout the colon consistent with constipation. 5.  Aortic Atherosclerosis (ICD10-I70.0). Electronically Signed   By: Randa Ngo M.D.   On: 07/16/2022 21:37   CT CHEST ABDOMEN PELVIS W CONTRAST  Result Date: 07/12/2022 CLINICAL DATA:  Breast cancer restaging. * Tracking Code: BO * history of metastatic disease to the duodenum. EXAM: CT CHEST, ABDOMEN, AND PELVIS WITH CONTRAST TECHNIQUE: Multidetector CT imaging of the chest, abdomen and pelvis was performed following the standard protocol during bolus administration of intravenous contrast. RADIATION DOSE REDUCTION: This exam was performed  according to the departmental dose-optimization program which includes automated exposure control, adjustment of the mA and/or kV according to patient size and/or use of iterative reconstruction technique. CONTRAST:  144m OMNIPAQUE IOHEXOL 300 MG/ML  SOLN COMPARISON:  01/25/2022 FINDINGS: CT CHEST FINDINGS Cardiovascular: The heart size is normal. No substantial pericardial effusion. Mild atherosclerotic calcification is noted in the wall of the thoracic aorta. Mediastinum/Nodes: No mediastinal lymphadenopathy. There is no hilar lymphadenopathy. The esophagus has normal imaging features. There is no axillary lymphadenopathy. Lungs/Pleura: No suspicious pulmonary nodule or mass. No focal airspace consolidation. No pleural effusion. Similar appearance of subsegmental atelectasis/linear scarring in the lower lungs bilaterally. Musculoskeletal: No worrisome lytic or sclerotic osseous abnormality. CT ABDOMEN PELVIS FINDINGS Hepatobiliary: No suspicious focal abnormality within the liver parenchyma. Intra and extrahepatic biliary duct dilatation is new in the interval. Pneumobilia is compatible with the presence of the common bile duct stent device common duct measures up to 18 mm diameter. Common bile duct around the stent in the head of the pancreas is 14 mm diameter. Pancreas: Diffuse pancreatic atrophy noted with dilatation of the main pancreatic duct, similar to prior. Spleen: No splenomegaly. No focal mass lesion. Adrenals/Urinary Tract: No adrenal nodule or mass. Kidneys unremarkable. No evidence for hydroureter. The urinary bladder appears normal for the degree of distention. Stomach/Bowel: Stomach is unremarkable. No gastric wall thickening. Status post gastrojejunostomy. Wall of the descending duodenum is  ill-defined and appears thickened, as before. Stent device noted in the transverse segment of the duodenum. No small bowel wall thickening. No small bowel dilatation. The terminal ileum is normal. No gross  colonic mass. No colonic wall thickening. Mild diverticular changes noted sigmoid colon without diverticulitis. Vascular/Lymphatic: There is moderate atherosclerotic calcification of the abdominal aorta without aneurysm. A There is no gastrohepatic or hepatoduodenal ligament lymphadenopathy. No retroperitoneal or mesenteric lymphadenopathy. No pelvic sidewall lymphadenopathy. Reproductive: Unremarkable. Other: No intraperitoneal free fluid. Musculoskeletal: No worrisome lytic or sclerotic osseous abnormality. IMPRESSION: 1. Interval development of intra and extrahepatic biliary duct dilatation with common bile duct stent device in place. Correlation for stent malfunction recommended. Gas in the biliary tree is presumably secondary to the presence of the stent device. 2. Wall of the descending duodenum is ill-defined and transverse segment appears thickened, as before. 3. Status post gastrojejunostomy. 4. No evidence for new metastatic disease in the chest, abdomen, or pelvis. 5.  Aortic Atherosclerosis (ICD10-I70.0). Electronically Signed   By: Misty Stanley M.D.   On: 07/12/2022 10:54   DG Bone Density  Result Date: 07/04/2022 EXAM: DUAL X-RAY ABSORPTIOMETRY (DXA) FOR BONE MINERAL DENSITY IMPRESSION: Dear Dr Janese Banks, Your patient AAMIYAH DERRICK completed a FRAX assessment on 07/04/2022 using the [place] (analysis version: 14.10) manufactured by [employer]. The following summarizes the results of our evaluation. PATIENT BIOGRAPHICAL: Name: Rooney, Gladwin Patient ID: [id number] Birth Date: 10-20-1955 Height:    62.0 in. Gender:     Female    Age[age]        4.4       Weight:    140.2 lbs. Ethnicity:  White                            Exam Date: 07/04/2022 FRAX* RESULTS:  (version: 3.5) 10-year Probability of Fracture1 Major Osteoporotic Fracture2 Hip Fracture 13.2% 0.9% Population: [place] (Caucasian) Risk Factors: History of Fracture (Adult) Based on Femur (Left) Neck BMD 1 -The 10-year probability of  fracture may be lower than reported if the patient has received treatment. 2 -Major Osteoporotic Fracture: Clinical Spine, Forearm, Hip or Shoulder *FRAX is a Materials engineer of the [employer] for Metabolic Bone Disease, a [place] (WHO) [facility]. ASSESSMENT: The probability of a major osteoporotic fracture is 13.2% within the next ten years. The probability of a hip fracture is 0.9% within the next ten years. . Your patient Chandani Rogowski completed a BMD test on 07/04/2022 using the [place] (software version: 14.10) manufactured by [employer]. The following summarizes the results of our evaluation. Technologist: mtb PATIENT BIOGRAPHICAL: Name: Meiko, Ives Patient ID: [id number] Birth Date: 1955-08-10 Height: 62.0 in. Gender: Female Exam Date: 07/04/2022 Weight: 140.2 lbs. Indications: Breast CA, Caucasian, Hypothyroid, Postmenopausal, History of Fracture (Adult) Fractures: Wrist Right Treatments: Calcium, Vitamin D DENSITOMETRY RESULTS: Site         Region     Measured Date Measured Age WHO Classification Young Adult T-score BMD         %Change vs. Previous Significant Change (*) AP Spine L1-L2 07/04/2022 68.4 Osteopenia -1.1 1.042 g/cm2 - - DualFemur Neck Left 07/04/2022 68.4 Normal -0.7 0.942 g/cm2 - - DualFemur Total Mean 07/04/2022 68.4 Normal -0.4 0.952 g/cm2 - - Left Forearm Radius 33% 07/04/2022 68.4 Normal -0.7 0.814 g/cm2 - - ASSESSMENT: The BMD measured at AP Spine L1-L2 is 1.042 g/cm2 with  a T-score of -1.1. This patient is considered osteopenic according to [place] [facility]) criteria. The scan quality is good. L-3 and L-4 were excluded due to degenerative changes. World Pharmacologist [facility]) criteria for post-menopausal, Caucasian Women: Normal:                   T-score at or above -1 SD Osteopenia/low bone mass: T-score between -1 and -2.5 SD Osteoporosis:             T-score at or below -2.5 SD  RECOMMENDATIONS: 1. All patients should optimize calcium and vitamin D intake. 2. Consider FDA-approved medical therapies in postmenopausal women and men aged 34 years and older, based on the following: a. A hip or vertebral(clinical or morphometric) fracture b. T-score < -2.5 at the femoral neck or spine after appropriate evaluation to exclude secondary causes c. Low bone mass (T-score between -1.0 and -2.5 at the femoral neck or spine) and a 10-year probability of a hip fracture > 3% or a 10-year probability of a major osteoporosis-related fracture > 20% based on the [place]-adapted WHO algorithm 3. Clinician judgment and/or patient preferences may indicate treatment for people with 10-year fracture probabilities above or below these levels FOLLOW-UP: People with diagnosed cases of osteoporosis or at high risk for fracture should have regular bone mineral density tests. For patients eligible for Medicare, routine testing is allowed once every 2 years. The testing frequency can be increased to one year for patients who have rapidly progressing disease, those who are receiving or discontinuing medical therapy to restore bone mass, or have additional risk factors. I have reviewed this report, and agree with the above findings. [facility] Electronically Signed   By: Zerita Boers M.D.   On: 07/04/2022 13:15    Microbiology: Results for orders placed or performed during the hospital encounter of 07/16/22  Blood Culture (routine x 2)     Status: None (Preliminary result)   Collection Time: 07/16/22  8:56 PM   Specimen: BLOOD  Result Value Ref Range Status   Specimen Description BLOOD BLOOD RIGHT ARM  Final   Special Requests   Final    BOTTLES DRAWN AEROBIC AND ANAEROBIC Blood Culture results may not be optimal due to an excessive volume of blood received in culture bottles   Culture   Final    NO GROWTH 2 DAYS Performed at [facility], [address], [place], [place] [address]     Report Status PENDING  Incomplete  Blood Culture (routine x 2)     Status: None (Preliminary result)   Collection Time: 07/16/22  8:56 PM   Specimen: BLOOD  Result Value Ref Range Status   Specimen Description BLOOD BLOOD RIGHT ARM  Final   Special Requests   Final    BOTTLES DRAWN AEROBIC AND ANAEROBIC Blood Culture results may not be optimal due to an excessive volume of blood received in culture bottles   Culture   Final    NO GROWTH 2 DAYS Performed at [facility], [address], [place], [place] [address]    Report Status PENDING  Incomplete    Labs: CBC: Recent Labs  Lab 07/16/22 1949 07/16/22 2056 07/17/22 0505  WBC 9.8  --  8.5  NEUTROABS  --  7.4  --   HGB 13.3  --  12.2  HCT 38.3  --  34.6*  MCV 93.6  --  94.3  PLT 350  --  937   Basic Metabolic  Panel: Recent Labs  Lab 07/16/22 1949 07/17/22 0505 07/18/22 0237  NA 132* 138 134*  K 3.3* 3.4* 3.6  CL 95* 99 103  CO2 '22 26 23  '$ GLUCOSE 233* 146* 107*  BUN '20 16 20  '$ CREATININE 1.01* 0.95 1.20*  CALCIUM 10.2 9.7 8.7*   Liver Function Tests: Recent Labs  Lab 07/16/22 1949 07/17/22 0505 07/18/22 0237  AST 881* 554* 155*  ALT 767* 677* 365*  ALKPHOS 331* 284* 220*  BILITOT 2.7* 3.9* 1.5*  PROT 7.6 6.5 6.1*  ALBUMIN 4.0 3.4* 3.1*   CBG: Recent Labs  Lab 07/18/22 0445 07/18/22 1151 07/18/22 1548 07/18/22 2116 07/19/22 0836  GLUCAP 105* 168* 156* 215* 169*    Discharge time spent: greater than 30 minutes.  This record has been created using Systems analyst. Errors have been sought and corrected,but may not always be located. Such creation errors do not reflect on the standard of care.   Signed: Lorella Nimrod, MD [facility] 07/19/2022

## 2022-07-19 NOTE — Progress Notes (Signed)
Pt was seen at bedside prior to d/c. Gravity bag was removed with 400 cc OP brown fluid. Biliary drain was capped. Pt was provided with gravity bag and educated that is she were to experience fever, chills, rigors or similar symptoms that she presented to the ED with, she will need to remove cap and attach gravity bag. She was also advised that if there was a need to use the gravity bag, she will need to flush drain once daily and record the amount of OP daily. Pt state that she knows how to flush drain since she had one prior. She understands that there is a risk for dehydration with large volume output from drain.  Pt wrote down instructions and verbalized understanding as well. She is scheduled for f/u in 4 weeks. She is aware to contact IR if she has any issues or problems with her drain.     Narda Rutherford, AGNP-BC 07/19/2022, 12:59 PM

## 2022-07-20 ENCOUNTER — Telehealth: Payer: Self-pay | Admitting: *Deleted

## 2022-07-20 NOTE — Patient Outreach (Signed)
  Care Coordination [facility] Note Transition Care Management Follow-up Telephone Call Date of discharge and from where: [facility] [id number] RUQ Pain How have you been since you were released from the hospital? A little better each day. I slept good last night. My pain medication is helping. Any questions or concerns? No  Items Reviewed: Did the pt receive and understand the discharge instructions provided? Yes  Medications obtained and verified? Yes  Other? No  Any new allergies since your discharge? No  Dietary orders reviewed? No Do you have support at home? Yes   Home Care and Equipment/Supplies: Were home health services ordered? no If so, what is the name of the agency? n  Has the agency set up a time to come to the patient's home? not applicable Were any new equipment or medical supplies ordered?  No What is the name of the medical supply agency? n Were you able to get the supplies/equipment? not applicable Do you have any questions related to the use of the equipment or supplies? No  Functional Questionnaire: (I = Independent and D = Dependent) ADLs: I  Bathing/Dressing- I  Meal Prep- I  Eating- I  Maintaining continence- I  Transferring/Ambulation- I  Managing Meds- I  Follow up appointments reviewed:  PCP Hospital f/u appt confirmed? Yes  Scheduled to see Dr Brita Romp [id number] 10:20 . [facility] f/u appt confirmed? Yes   [id number] am bone scan injection [id number] Bone Scan 12:00 [id number] 11:15 Lab [id number] 11:30 Dr Janese Banks Are transportation arrangements needed? No  If their condition worsens, is the pt aware to call PCP or go to the Emergency Dept.? Yes Was the patient provided with contact information for the PCP's office or ED? Yes Was to pt encouraged to call back with questions or concerns? Yes  SDOH assessments and interventions completed:   Yes SDOH Interventions Today    Flowsheet Row Most Recent Value  SDOH Interventions   Food Insecurity  Interventions Intervention Not Indicated  Housing Interventions Intervention Not Indicated  Transportation Interventions Intervention Not Indicated       Care Coordination Interventions:  RN went over appointments. Patient had them incorrectly    Encounter Outcome:  Pt. Visit Completed    [place] [facility] [phone]

## 2022-07-21 LAB — CULTURE, BLOOD (ROUTINE X 2)
Culture: NO GROWTH
Culture: NO GROWTH

## 2022-07-21 NOTE — Progress Notes (Signed)
I,Sulibeya S Dimas,acting as a scribe for Shirlee Latch, MD.,have documented all relevant documentation on the behalf of Shirlee Latch, MD,as directed by  Shirlee Latch, MD while in the presence of Shirlee Latch, MD.     Established patient visit   Patient: Diamond Hinton   DOB: May 01, 1956   67 y.o. Female  MRN: [id number] Visit Date: 07/24/2022  Today's healthcare provider: Shirlee Latch, MD   Chief Complaint  Patient presents with   Hospitalization Follow-up   Subjective    HPI  Follow up Hospitalization  Patient was admitted to [facility] on 07/16/22 and discharged on 07/19/22. She was treated for right upper quadrant pain and nausea.  Treatment for this included labs, CT scan. Start Augmentin 875-125 mg 1 tablet BID x 3 days. Dulcolax suppositories as needed for constipation. Telephone follow up was done on 07/20/22 She reports excellent compliance with treatment. She reports this condition is improved.  ----------------------------------------------------------------------------------------- - Patient reports she has not been taking bp medication. She reports her bp was low at the hospital. '  Patient was advised to restart pantoprazole at discharge. She reports she has not needed medication.   Medications: Outpatient Medications Prior to Visit  Medication Sig   acetaminophen (TYLENOL) 325 MG tablet Take 325 mg by mouth every 6 (six) hours as needed (for pain and sometimes takes 2 if pain is worse).   letrozole (FEMARA) 2.5 MG tablet Take 1 tablet (2.5 mg total) by mouth daily.   levothyroxine (SYNTHROID) 88 MCG tablet Take 1 tablet by mouth once daily   morphine (MS CONTIN) 15 MG 12 hr tablet Take 1 tablet (15 mg total) by mouth every 12 (twelve) hours for 5 days.   Multiple Vitamins-Minerals (CENTRUM VITAMINTS PO) Take 1 tablet by mouth daily.   ondansetron (ZOFRAN) 4 MG tablet Take 1 tablet (4 mg total) by mouth daily as needed for nausea or vomiting.    prochlorperazine (COMPAZINE) 5 MG tablet Take 1 tablet (5 mg total) by mouth every 6 (six) hours as needed for refractory nausea / vomiting.   [DISCONTINUED] oxyCODONE (ROXICODONE) 5 MG immediate release tablet Take 1 tablet (5 mg total) by mouth every 4 (four) hours as needed for severe pain.   hydrochlorothiazide (HYDRODIURIL) 25 MG tablet Take 1 tablet (25 mg total) by mouth daily. (Patient not taking: Reported on 07/24/2022)   palbociclib (IBRANCE) 100 MG tablet Take 1 tablet (100 mg total) by mouth daily. Take for 21 days on, 7 days off, repeat every 28 days. Hold until you see your oncologist (Patient not taking: Reported on 07/24/2022)   [DISCONTINUED] amoxicillin-clavulanate (AUGMENTIN) 875-125 MG tablet Take 1 tablet by mouth 2 (two) times daily for 3 days.   [DISCONTINUED] pantoprazole (PROTONIX) 40 MG tablet Take 40 mg by mouth daily. (Patient not taking: Reported on 07/24/2022)   No facility-administered medications prior to visit.    Review of Systems  Constitutional:  Negative for chills and fever.  Respiratory:  Negative for cough, chest tightness and shortness of breath.   Gastrointestinal:  Positive for nausea. Negative for abdominal pain, blood in stool, constipation, diarrhea and vomiting.       Objective    BP 134/83 (BP Location: Left Arm, Patient Position: Sitting, Cuff Size: Normal)   Pulse 71   Temp 97.9 F (36.6 C) (Temporal)   Resp 20   Wt 141 lb (64 kg)   SpO2 99%   BMI 24.98 kg/m    Physical Exam Vitals reviewed.  Constitutional:  General: She is not in acute distress.    Appearance: Normal appearance. She is well-developed. She is not diaphoretic.  HENT:     Head: Normocephalic and atraumatic.  Eyes:     General: No scleral icterus.    Conjunctiva/sclera: Conjunctivae normal.  Neck:     Thyroid: No thyromegaly.  Cardiovascular:     Rate and Rhythm: Normal rate and regular rhythm.     Heart sounds: Normal heart sounds. No murmur  heard. Pulmonary:     Effort: Pulmonary effort is normal. No respiratory distress.     Breath sounds: Normal breath sounds. No wheezing, rhonchi or rales.  Abdominal:     General: Bowel sounds are normal. There is no distension.     Palpations: Abdomen is soft.     Tenderness: There is no abdominal tenderness.  Musculoskeletal:     Right lower leg: No edema.     Left lower leg: No edema.  Skin:    General: Skin is warm and dry.  Neurological:     Mental Status: She is alert and oriented to person, place, and time. Mental status is at baseline.  Psychiatric:        Mood and Affect: Mood normal.        Behavior: Behavior normal.       No results found for any visits on 07/24/22.  Assessment & Plan     Problem List Items Addressed This Visit       Cardiovascular and Mediastinum   Hypertension associated with diabetes (HCC)    Ok to continue to hold HCTZ Continue to monitor BPs and resume if sBP >140        Digestive   Reflux gastritis    Resolved Now off of pantoprazole Ok to hold in setting of symptoms resolved        Other   Biliary stent obstruction, initial encounter - Primary    LFTs downtrending Due for recheck tomorrow with Onc S/p percutaneous transhepatic cholangiogram and drainage placement with IR on 2/5      SIRS (systemic inflammatory response syndrome) (HCC)    Resolved with normal VSs today        Meds ordered this encounter  Medications   oxyCODONE (ROXICODONE) 5 MG immediate release tablet    Sig: Take 1 tablet (5 mg total) by mouth every 4 (four) hours as needed for severe pain.    Dispense:  30 tablet    Refill:  0     Return if symptoms worsen or fail to improve.      I, Shirlee Latch, MD, have reviewed all documentation for this visit. The documentation on 07/24/22 for the exam, diagnosis, procedures, and orders are all accurate and complete.   Diamond Hinton, Diamond Schlein, MD, MPH [facility]

## 2022-07-24 ENCOUNTER — Encounter: Payer: Self-pay | Admitting: Family Medicine

## 2022-07-24 ENCOUNTER — Ambulatory Visit (INDEPENDENT_AMBULATORY_CARE_PROVIDER_SITE_OTHER): Payer: Medicare HMO | Admitting: Family Medicine

## 2022-07-24 ENCOUNTER — Encounter
Admit: 2022-07-24 | Discharge: 2022-07-24 | Disposition: A | Discharge status: Home / Self Care | Payer: Medicare HMO | Attending: Oncology | Admitting: Oncology

## 2022-07-24 ENCOUNTER — Encounter
Admission: RE | Admit: 2022-07-24 | Discharge: 2022-07-24 | Disposition: A | Discharge status: Home / Self Care | Payer: Medicare HMO | Source: Ambulatory Visit | Attending: Oncology | Admitting: Oncology

## 2022-07-24 VITALS — BP 134/83 | HR 71 | Temp 97.9°F | Resp 20 | Wt 141.0 lb

## 2022-07-24 DIAGNOSIS — T85590A Other mechanical complication of bile duct prosthesis, initial encounter: Secondary | ICD-10-CM

## 2022-07-24 DIAGNOSIS — I152 Hypertension secondary to endocrine disorders: Secondary | ICD-10-CM | POA: Diagnosis not present

## 2022-07-24 DIAGNOSIS — K2960 Other gastritis without bleeding: Secondary | ICD-10-CM | POA: Diagnosis not present

## 2022-07-24 DIAGNOSIS — C784 Secondary malignant neoplasm of small intestine: Secondary | ICD-10-CM | POA: Diagnosis not present

## 2022-07-24 DIAGNOSIS — C50912 Malignant neoplasm of unspecified site of left female breast: Secondary | ICD-10-CM | POA: Diagnosis not present

## 2022-07-24 DIAGNOSIS — E1159 Type 2 diabetes mellitus with other circulatory complications: Secondary | ICD-10-CM | POA: Diagnosis not present

## 2022-07-24 DIAGNOSIS — C50919 Malignant neoplasm of unspecified site of unspecified female breast: Secondary | ICD-10-CM | POA: Diagnosis not present

## 2022-07-24 DIAGNOSIS — R6510 Systemic inflammatory response syndrome (SIRS) of non-infectious origin without acute organ dysfunction: Secondary | ICD-10-CM

## 2022-07-24 DIAGNOSIS — Z79899 Other long term (current) drug therapy: Secondary | ICD-10-CM | POA: Diagnosis not present

## 2022-07-24 MED ORDER — TECHNETIUM TC 99M MEDRONATE IV KIT
20.0000 | PACK | Freq: Once | INTRAVENOUS | Status: AC | PRN
  Administered 2022-07-24: 21.87 via INTRAVENOUS

## 2022-07-24 MED ORDER — OXYCODONE HCL 5 MG PO TABS: 5.0000 mg | ORAL_TABLET | ORAL | 0 refills | Status: DC | PRN

## 2022-07-24 NOTE — Assessment & Plan Note (Signed)
LFTs downtrending Due for recheck tomorrow with Onc S/p percutaneous transhepatic cholangiogram and drainage placement with IR on 2/5

## 2022-07-24 NOTE — Assessment & Plan Note (Signed)
Resolved Now off of pantoprazole Ok to hold in setting of symptoms resolved

## 2022-07-24 NOTE — Assessment & Plan Note (Signed)
Resolved with normal VSs today

## 2022-07-24 NOTE — Assessment & Plan Note (Signed)
Ok to continue to hold HCTZ Continue to monitor BPs and resume if sBP >140

## 2022-07-25 ENCOUNTER — Inpatient Hospital Stay: Payer: Medicare HMO | Attending: Oncology | Admitting: Oncology

## 2022-07-25 ENCOUNTER — Encounter: Payer: Self-pay | Admitting: Oncology

## 2022-07-25 ENCOUNTER — Inpatient Hospital Stay: Payer: Medicare HMO

## 2022-07-25 VITALS — BP 133/81 | HR 65 | Temp 98.7°F | Resp 18 | Wt 141.3 lb

## 2022-07-25 DIAGNOSIS — R7989 Other specified abnormal findings of blood chemistry: Secondary | ICD-10-CM | POA: Diagnosis not present

## 2022-07-25 DIAGNOSIS — Z79899 Other long term (current) drug therapy: Secondary | ICD-10-CM

## 2022-07-25 DIAGNOSIS — C784 Secondary malignant neoplasm of small intestine: Secondary | ICD-10-CM | POA: Diagnosis not present

## 2022-07-25 DIAGNOSIS — C50919 Malignant neoplasm of unspecified site of unspecified female breast: Secondary | ICD-10-CM | POA: Insufficient documentation

## 2022-07-25 DIAGNOSIS — C50912 Malignant neoplasm of unspecified site of left female breast: Secondary | ICD-10-CM | POA: Diagnosis not present

## 2022-07-25 DIAGNOSIS — Z79811 Long term (current) use of aromatase inhibitors: Secondary | ICD-10-CM | POA: Insufficient documentation

## 2022-07-25 DIAGNOSIS — Z170 Estrogen receptor positive status [ER+]: Secondary | ICD-10-CM | POA: Insufficient documentation

## 2022-07-25 LAB — COMPREHENSIVE METABOLIC PANEL
ALT: 42 U/L (ref 0–44)
AST: 19 U/L (ref 15–41)
Albumin: 3.6 g/dL (ref 3.5–5.0)
Alkaline Phosphatase: 127 U/L — ABNORMAL HIGH (ref 38–126)
Anion gap: 8 (ref 5–15)
BUN: 15 mg/dL (ref 8–23)
CO2: 26 mmol/L (ref 22–32)
Calcium: 9.1 mg/dL (ref 8.9–10.3)
Chloride: 104 mmol/L (ref 98–111)
Creatinine, Ser: 0.9 mg/dL (ref 0.44–1.00)
GFR, Estimated: >60 mL/min (ref >60)
Glucose, Bld: 165 mg/dL — ABNORMAL HIGH (ref 70–99)
Potassium: 3.5 mmol/L (ref 3.5–5.1)
Sodium: 138 mmol/L (ref 135–145)
Total Bilirubin: 0.4 mg/dL (ref 0.3–1.2)
Total Protein: 6.9 g/dL (ref 6.5–8.1)

## 2022-07-25 NOTE — Progress Notes (Signed)
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  She was found to have diffuse duodenal wall thickening as well as a palpable mass in the left breast.  Duodenal biopsy as well as left breast biopsy was consistent withER positive greater than 90%, PR weakly +1 to 10% HER2 negative lobular breast cancer.  Patient underwent external biliary drain for the obstructive jaundice and palliative gastrojejunostomy surgery to relieve her gastric outlet obstruction.CT scan otherwise did not show any evidence of distant metastatic disease.   Patient was started on letrozole plus Kisqali in early April 2023.  However patient developed AKI and hyperuricemia requiring hospitalizations.  Thelma Comp was therefore stopped and patient was started on Ibrance 75 mg in May 2023.NGS testing showed no actionable mutations.  PD-L1 CPS score 1.  MSI stable.  Tumor mutational burden not high.    Interval history-patient was in the hospital for possible cholangitis and abnormal LFTs.  She had a repeat PTCA done but upon discharge the PTCA was capped as it was thought that her stent was draining appropriately internally.  She has a repeat follow-up appointment with IR next month.  ECOG PS- 1 Pain scale- 0 Opioid associated constipation- no  Review of systems- Review of Systems   Constitutional:  Positive for malaise/fatigue. Negative for chills, fever and weight loss.  HENT:  Negative for congestion, ear discharge and nosebleeds.   Eyes:  Negative for blurred vision.  Respiratory:  Negative for cough, hemoptysis, sputum production, shortness of breath and wheezing.   Cardiovascular:  Negative for chest pain, palpitations, orthopnea and claudication.  Gastrointestinal:  Positive for abdominal pain. Negative for blood in stool, constipation, diarrhea, heartburn, melena, nausea and vomiting.  Genitourinary:  Negative for dysuria, flank pain, frequency, hematuria and urgency.  Musculoskeletal:  Negative for back pain, joint pain and myalgias.  Skin:  Negative for rash.  Neurological:  Negative for dizziness, tingling, focal weakness, seizures, weakness and headaches.  Endo/Heme/Allergies:  Does not bruise/bleed easily.  Psychiatric/Behavioral:  Negative for depression and suicidal ideas. The patient does not have insomnia.       Allergies  Allergen Reactions   Peanuts [Peanut Oil] Shortness Of Breath and Itching     Past Medical History:  Diagnosis Date   Anemia    Anxiety    Breast cancer ([place])    Gallstones    GERD (gastroesophageal reflux disease)    Hyperlipidemia    Hypertension    Jaundice 08/29/2021   Rash 08/29/2021   Right upper quadrant abdominal pain 08/29/2021   UTI (urinary tract infection) 08/29/2021     Past Surgical History:  Procedure Laterality Date   BILATERAL SALPINGECTOMY  04/03/1997   CHOLECYSTECTOMY N/A 07/07/2015   Procedure: LAPAROSCOPIC CHOLECYSTECTOMY ;  Surgeon: Jules Husbands, MD;  Location: [facility] ORS;  Service: General;  Laterality: N/A;   ESOPHAGOGASTRODUODENOSCOPY  08/30/2021   Procedure: ESOPHAGOGASTRODUODENOSCOPY (EGD);  Surgeon: Lucilla Lame,  MD;  Location: [facility] ENDOSCOPY;  Service: Endoscopy;;   GALLBLADDER SURGERY  07/07/2015   [facility]   IR BILIARY DRAIN PLACEMENT WITH CHOLANGIOGRAM  08/31/2021   IR BILIARY STENT(S) NEW  ACCESS WITH DRAIN  02/08/2022   IR CONVERT BILIARY DRAIN TO INT EXT BILIARY DRAIN  09/02/2021   IR EXCHANGE BILIARY DRAIN  10/10/2021   IR EXCHANGE BILIARY DRAIN  12/05/2021   IR EXCHANGE BILIARY DRAIN  01/19/2022   IR INT EXT BILIARY DRAIN WITH CHOLANGIOGRAM  07/17/2022   IR RADIOLOGIST EVAL & MGMT  03/01/2022    Social History   Socioeconomic History   Marital status: Married    Spouse name: Not on file   Number of children: Not on file   Years of education: Not on file   Highest education level: Not on file  Occupational History   Not on file  Tobacco Use   Smoking status: Never   Smokeless tobacco: Never  Vaping Use   Vaping Use: Never used  Substance and Sexual Activity   Alcohol use: No   Drug use: No   Sexual activity: Yes  Other Topics Concern   Not on file  Social History Narrative   Not on file   Social Determinants of Health   Financial Resource Strain: Low Risk  (11/24/2021)   Overall Financial Resource Strain (CARDIA)    Difficulty of Paying Living Expenses: Not hard at all  Food Insecurity: No Food Insecurity (07/20/2022)   Hunger Vital Sign    Worried About Running Out of Food in the Last Year: Never true    Ran Out of Food in the Last Year: Never true  Transportation Needs: No Transportation Needs (07/20/2022)   PRAPARE - Hydrologist (Medical): No    Lack of Transportation (Non-Medical): No  Physical Activity: Not on file  Stress: No Stress Concern Present (11/24/2021)   [place]    Feeling of Stress : Only a little  Social Connections: Unknown (11/24/2021)   Social Connection and Isolation Panel [NHANES]    Frequency of Communication with Friends and Family: Three times a week    Frequency of Social Gatherings with Friends and Family: Three times a week    Attends Religious Services: Patient refused    Active Member of Clubs or Organizations: Patient refused     Attends Archivist Meetings: Patient refused    Marital Status: Married  Human resources officer Violence: Not At Risk (07/17/2022)   Humiliation, Afraid, Rape, and Kick questionnaire    Fear of Current or Ex-Partner: No    Emotionally Abused: No    Physically Abused: No    Sexually Abused: No    Family History  Problem Relation Age of Onset   Hypertension Mother    CVA Mother    Ulcers Mother    Emphysema Mother    Heart disease Father    Prostate cancer Father    Macular degeneration Father    Hypertension Sister    Diabetes Sister    Cervical polyp Sister    Hypertension Sister    Diabetes Sister    Lymphoma Sister      Current Outpatient Medications:    letrozole (FEMARA) 2.5 MG tablet, Take 1 tablet (2.5 mg total) by mouth daily., Disp: 30 tablet, Rfl: 3   levothyroxine (SYNTHROID) 88 MCG tablet, Take 1 tablet by mouth once daily, Disp: 90 tablet, Rfl: 0   Multiple Vitamins-Minerals (CENTRUM  VITAMINTS PO), Take 1 tablet by mouth daily., Disp: , Rfl:    ondansetron (ZOFRAN) 4 MG tablet, Take 1 tablet (4 mg total) by mouth daily as needed for nausea or vomiting., Disp: 30 tablet, Rfl: 1   oxyCODONE (ROXICODONE) 5 MG immediate release tablet, Take 1 tablet (5 mg total) by mouth every 4 (four) hours as needed for severe pain., Disp: 30 tablet, Rfl: 0   prochlorperazine (COMPAZINE) 5 MG tablet, Take 1 tablet (5 mg total) by mouth every 6 (six) hours as needed for refractory nausea / vomiting., Disp: 30 tablet, Rfl: 1   acetaminophen (TYLENOL) 325 MG tablet, Take 325 mg by mouth every 6 (six) hours as needed (for pain and sometimes takes 2 if pain is worse). (Patient not taking: Reported on 07/25/2022), Disp: , Rfl:    hydrochlorothiazide (HYDRODIURIL) 25 MG tablet, Take 1 tablet (25 mg total) by mouth daily. (Patient not taking: Reported on 07/24/2022), Disp: 90 tablet, Rfl: 1   palbociclib (IBRANCE) 100 MG tablet, Take 1 tablet (100 mg total) by mouth daily. Take for 21 days on,  7 days off, repeat every 28 days. Hold until you see your oncologist (Patient not taking: Reported on 07/24/2022), Disp: 21 tablet, Rfl: 3  Physical exam:  Vitals:   07/25/22 1115  BP: 133/81  Pulse: 65  Resp: 18  Temp: 98.7 F (37.1 C)  TempSrc: Tympanic  SpO2: 99%  Weight: 141 lb 4.8 oz (64.1 kg)   Physical Exam Cardiovascular:     Rate and Rhythm: Normal rate and regular rhythm.     Heart sounds: Normal heart sounds.  Pulmonary:     Effort: Pulmonary effort is normal.     Breath sounds: Normal breath sounds.  Abdominal:     General: Bowel sounds are normal.     Palpations: Abdomen is soft.     Comments: Ptca in place but capped  Skin:    General: Skin is warm and dry.  Neurological:     Mental Status: She is alert and oriented to person, place, and time.    Breast exam: left breast mass not palpable     Latest Ref Rng & Units 07/25/2022   11:03 AM  CMP  Glucose 70 - 99 mg/dL 165   BUN 8 - 23 mg/dL 15   Creatinine 0.44 - 1.00 mg/dL 0.90   Sodium 135 - 145 mmol/L 138   Potassium 3.5 - 5.1 mmol/L 3.5   Chloride 98 - 111 mmol/L 104   CO2 22 - 32 mmol/L 26   Calcium 8.9 - 10.3 mg/dL 9.1   Total Protein 6.5 - 8.1 g/dL 6.9   Total Bilirubin 0.3 - 1.2 mg/dL 0.4   Alkaline Phos 38 - 126 U/L 127   AST 15 - 41 U/L 19   ALT 0 - 44 U/L 42       Latest Ref Rng & Units 07/17/2022    5:05 AM  CBC  WBC 4.0 - 10.5 K/uL 8.5   Hemoglobin 12.0 - 15.0 g/dL 12.2   Hematocrit 36.0 - 46.0 % 34.6   Platelets 150 - 400 K/uL 279     No images are attached to the encounter.  NM Bone Scan Whole Body  Result Date: 07/25/2022 CLINICAL DATA:  Breast cancer EXAM: NUCLEAR MEDICINE WHOLE BODY BONE SCAN TECHNIQUE: Whole body anterior and posterior images were obtained approximately 3 hours after intravenous injection of radiopharmaceutical. RADIOPHARMACEUTICALS:  21.9 mCi Technetium-75mMDP IV COMPARISON:  None Available. FINDINGS: Bilateral shoulder,  sternomanubrial, hip and ankle activity  noted which are typically degenerative findings. There is also probable degenerative uptake noted in the midcervical spine on the right. There is bilateral renal activity. IMPRESSION: Nonspecific multifocal activity which is probably degenerative. No definite osseous metastatic disease. Electronically Signed   By: Sammie Bench M.D.   On: 07/25/2022 11:27   IR INT EXT BILIARY DRAIN WITH CHOLANGIOGRAM  Result Date: 07/17/2022 INDICATION: 67 year old female referred for percutaneous transhepatic cholangiogram and possible drainage. The patient has a history of prior bile duct obstruction secondary to breast cancer metastasis into the duodenum. Initial drainage was performed 08/31/2021 and 09/02/2021 EXAM: IMAGE GUIDED PERCUTANEOUS TRANSHEPATIC CHOLANGIOGRAM PLACEMENT OF INTERNAL/EXTERNAL BILIARY DRAIN MEDICATIONS: 2 g Mefoxin; The antibiotic was administered within an appropriate time frame prior to the initiation of the procedure. ANESTHESIA/SEDATION: Moderate (conscious) sedation was employed during this procedure. A total of Versed 1.0 mg and Fentanyl 50 mcg was administered intravenously by the radiology nurse. 50 mg IV Benadryl Total intra-service moderate Sedation Time: 25 minutes. The patient's level of consciousness and vital signs were monitored continuously by radiology nursing throughout the procedure under my direct supervision. FLUOROSCOPY: Radiation Exposure Index (as provided by the fluoroscopic device): 5 mGy Kerma COMPLICATIONS: None PROCEDURE: The procedure, risks, benefits, and alternatives were explained to the patient and the patient's family. A complete informed consent was performed, with risk benefit analysis. Specific risks that were discussed for the procedure include bleeding, infection, biliary sepsis, IC use day, organ injury, need for further procedure, need for further surgery, long-term drain placement, cardiopulmonary collapse, death. Questions regarding the procedure were  encouraged and answered. The patient understands and consents to the procedure. Patient is position in supine position on the fluoroscopy table, and the upper abdomen was prepped and draped in the usual sterile fashion. Maximum barrier sterile technique with sterile gowns and gloves were used for the procedure. A timeout was performed prior to the initiation of the procedure. Local anesthesia was provided with 1% lidocaine with epinephrine. Ultrasound survey of the right liver lobe was performed. Adequate window into the right biliary system was present for the right liver approach. 1% lidocaine was used for local anesthesia, with generous infiltration of the skin and subcutaneous tissues in and inter left costal location. A Chiba needle was advanced under ultrasound guidance into the right liver lobe. Once the tip of the needle was confirmed within the biliary system by injecting small aliquots of contrast, images were stored of the biliary system after partially opacifying the biliary tree via the needle. 018 wire was advanced centrally. The wire entered and crossed the stent system in a serendipitous manner. The needle was removed, a small incision was made with an 11 blade scalpel, and then a triaxial Accustick system was advanced into the biliary system. The metal stiffener and dilator were removed, we confirmed placement with contrast infusion. Contrast was injected gently, and demonstrated patency of the stent system, as contrast enters the duodenum. There was some debris/ill-defined filling defects within the proximal stent system. 035 J wire was used to navigate across the stent system into the duodenum. Dilation of the subcutaneous tissue tracks was performed with an 10 [place] dilator and 12 [place] dilator, and then a 33 [place] biliary drain was placed as an internal/external biliary drain. Small amount of contrast confirmed location. Drain was sutured in position, attached to gravity drain, and final  images stored. The patient tolerated the procedure well and remained hemodynamically stable throughout. No complications were encountered and no  significant blood loss was encountered. IMPRESSION: Status post image guided percutaneous transhepatic cholangiogram and internal/external drain placement to gravity. Signed, Dulcy Fanny. Nadene Rubins, RPVI [facility] Electronically Signed   By: Corrie Mckusick D.O.   On: 07/17/2022 15:15   CT Abdomen Pelvis W Contrast  Result Date: 07/16/2022 CLINICAL DATA:  Increasing abdominal pain, nausea, history of metastatic breast cancer EXAM: CT ABDOMEN AND PELVIS WITH CONTRAST TECHNIQUE: Multidetector CT imaging of the abdomen and pelvis was performed using the standard protocol following bolus administration of intravenous contrast. RADIATION DOSE REDUCTION: This exam was performed according to the departmental dose-optimization program which includes automated exposure control, adjustment of the mA and/or kV according to patient size and/or use of iterative reconstruction technique. CONTRAST:  135m OMNIPAQUE IOHEXOL 300 MG/ML  SOLN COMPARISON:  07/12/2022 FINDINGS: Lower chest: Hypoventilatory changes at the lung bases. No acute pleural or parenchymal lung disease. Hepatobiliary: There is persistent intrahepatic and extrahepatic biliary duct dilation, with the upstream common bile duct measuring to 18 mm. A metallic stent is identified extending from the mid common bile duct into the duodenal lumen, not appreciably changed since prior exam. A small amount of gas is seen within the lumen of the stent, as well as within the intrahepatic bile ducts, consistent with stent patency. Otherwise the liver is unremarkable. The gallbladder is surgically absent. Pancreas: Stable pancreatic duct dilation. No acute inflammatory changes. Spleen: Normal in size without focal abnormality. Adrenals/Urinary Tract: Adrenal glands are  unremarkable. Kidneys are normal, without renal calculi, focal lesion, or hydronephrosis. Bladder is unremarkable. Stomach/Bowel: No bowel obstruction or ileus. Normal appendix right lower quadrant. Diverticulosis of the sigmoid colon without diverticulitis. There is moderate retained stool within the colon. Persistent wall thickening along the medial aspect of the proximal duodenum, unchanged. Postsurgical changes from gastrojejunostomy. Vascular/Lymphatic: Aortic atherosclerosis. Stable subcentimeter retroperitoneal lymph nodes. No pathologic adenopathy. Reproductive: Uterus and bilateral adnexa are unremarkable. Other: No free fluid or free intraperitoneal gas. No abdominal wall hernia. Musculoskeletal: There are no acute or destructive bony lesions. Reconstructed images demonstrate no additional findings. IMPRESSION: 1. Stable dilatation of the biliary tree and pancreatic duct, with indwelling metallic biliary stent unchanged. While there is minimal gas in the stent lumen and intrahepatic biliary tree suggesting stent patency, underlying stent dysfunction is suspected given persistent increased caliber of the biliary tree. 2. Stable wall thickening along the medial aspect of the proximal duodenum, compatible with given history of metastatic breast cancer. 3. Sigmoid diverticulosis without diverticulitis. 4. Significant retained stool throughout the colon consistent with constipation. 5.  Aortic Atherosclerosis (ICD10-I70.0). Electronically Signed   By: MRanda NgoM.D.   On: 07/16/2022 21:37   CT CHEST ABDOMEN PELVIS W CONTRAST  Result Date: 07/12/2022 CLINICAL DATA:  Breast cancer restaging. * Tracking Code: BO * history of metastatic disease to the duodenum. EXAM: CT CHEST, ABDOMEN, AND PELVIS WITH CONTRAST TECHNIQUE: Multidetector CT imaging of the chest, abdomen and pelvis was performed following the standard protocol during bolus administration of intravenous contrast. RADIATION DOSE REDUCTION: This  exam was performed according to the departmental dose-optimization program which includes automated exposure control, adjustment of the mA and/or kV according to patient size and/or use of iterative reconstruction technique. CONTRAST:  1020mOMNIPAQUE IOHEXOL 300 MG/ML  SOLN COMPARISON:  01/25/2022 FINDINGS: CT CHEST FINDINGS Cardiovascular: The heart size is normal. No substantial pericardial effusion. Mild atherosclerotic calcification is noted in the wall of the thoracic aorta. Mediastinum/Nodes: No mediastinal lymphadenopathy. There is no  hilar lymphadenopathy. The esophagus has normal imaging features. There is no axillary lymphadenopathy. Lungs/Pleura: No suspicious pulmonary nodule or mass. No focal airspace consolidation. No pleural effusion. Similar appearance of subsegmental atelectasis/linear scarring in the lower lungs bilaterally. Musculoskeletal: No worrisome lytic or sclerotic osseous abnormality. CT ABDOMEN PELVIS FINDINGS Hepatobiliary: No suspicious focal abnormality within the liver parenchyma. Intra and extrahepatic biliary duct dilatation is new in the interval. Pneumobilia is compatible with the presence of the common bile duct stent device common duct measures up to 18 mm diameter. Common bile duct around the stent in the head of the pancreas is 14 mm diameter. Pancreas: Diffuse pancreatic atrophy noted with dilatation of the main pancreatic duct, similar to prior. Spleen: No splenomegaly. No focal mass lesion. Adrenals/Urinary Tract: No adrenal nodule or mass. Kidneys unremarkable. No evidence for hydroureter. The urinary bladder appears normal for the degree of distention. Stomach/Bowel: Stomach is unremarkable. No gastric wall thickening. Status post gastrojejunostomy. Wall of the descending duodenum is ill-defined and appears thickened, as before. Stent device noted in the transverse segment of the duodenum. No small bowel wall thickening. No small bowel dilatation. The terminal ileum is  normal. No gross colonic mass. No colonic wall thickening. Mild diverticular changes noted sigmoid colon without diverticulitis. Vascular/Lymphatic: There is moderate atherosclerotic calcification of the abdominal aorta without aneurysm. A There is no gastrohepatic or hepatoduodenal ligament lymphadenopathy. No retroperitoneal or mesenteric lymphadenopathy. No pelvic sidewall lymphadenopathy. Reproductive: Unremarkable. Other: No intraperitoneal free fluid. Musculoskeletal: No worrisome lytic or sclerotic osseous abnormality. IMPRESSION: 1. Interval development of intra and extrahepatic biliary duct dilatation with common bile duct stent device in place. Correlation for stent malfunction recommended. Gas in the biliary tree is presumably secondary to the presence of the stent device. 2. Wall of the descending duodenum is ill-defined and transverse segment appears thickened, as before. 3. Status post gastrojejunostomy. 4. No evidence for new metastatic disease in the chest, abdomen, or pelvis. 5.  Aortic Atherosclerosis (ICD10-I70.0). Electronically Signed   By: Misty Stanley M.D.   On: 07/12/2022 10:54   DG Bone Density  Result Date: 07/04/2022 EXAM: DUAL X-RAY ABSORPTIOMETRY (DXA) FOR BONE MINERAL DENSITY IMPRESSION: Dear Dr Janese Banks, Your patient HALIMA DARRIGO completed a FRAX assessment on 07/04/2022 using the [place] (analysis version: 14.10) manufactured by [employer]. The following summarizes the results of our evaluation. PATIENT BIOGRAPHICAL: Name: Elisheba, Gracia Patient ID: [id number] Birth Date: 03-27-1956 Height:    62.0 in. Gender:     Female    [age]       Weight:    140.2 lbs. Ethnicity:  White                            Exam Date: 07/04/2022 FRAX* RESULTS:  (version: 3.5) 10-year Probability of Fracture1 Major Osteoporotic Fracture2 Hip Fracture 13.2% 0.9% Population: [place] (Caucasian) Risk Factors: History of Fracture (Adult) Based on Femur (Left) Neck BMD 1 -The 10-year  probability of fracture may be lower than reported if the patient has received treatment. 2 -Major Osteoporotic Fracture: Clinical Spine, Forearm, Hip or Shoulder *FRAX is a Materials engineer of the [employer] for Metabolic Bone Disease, a [place] (WHO) [facility]. ASSESSMENT: The probability of a major osteoporotic fracture is 13.2% within the next ten years. The probability of a hip fracture is 0.9% within the next ten years. . Your patient Thyra  Backs completed a BMD test on 07/04/2022 using the [place] (software version: 14.10) manufactured by [employer]. The following summarizes the results of our evaluation. Technologist: mtb PATIENT BIOGRAPHICAL: Name: Jasalyn, Plate Patient ID: [id number] Birth Date: 04-26-1956 Height: 62.0 in. Gender: Female Exam Date: 07/04/2022 Weight: 140.2 lbs. Indications: Breast CA, Caucasian, Hypothyroid, Postmenopausal, History of Fracture (Adult) Fractures: Wrist Right Treatments: Calcium, Vitamin D DENSITOMETRY RESULTS: Site         Region     Measured Date Measured Age WHO Classification Young Adult T-score BMD         %Change vs. Previous Significant Change (*) AP Spine L1-L2 07/04/2020 68.4 Osteopenia -1.1 1.042 g/cm2 - - DualFemur Neck Left 07/04/2022 68.4 Normal -0.7 0.942 g/cm2 - - DualFemur Total Mean 07/04/2022 68.4 Normal -0.4 0.952 g/cm2 - - Left Forearm Radius 33% 07/04/2022 68.4 Normal -0.7 0.814 g/cm2 - - ASSESSMENT: The BMD measured at AP Spine L1-L2 is 1.042 g/cm2 with a T-score of -1.1. This patient is considered osteopenic according to [facility]) criteria. The scan quality is good. L-3 and L-4 were excluded due to degenerative changes. World Pharmacologist [facility]) criteria for post-menopausal, Caucasian Women: Normal:                   T-score at or above -1 SD Osteopenia/low bone mass: T-score between -1 and -2.5 SD Osteoporosis:             T-score at or  below -2.5 SD RECOMMENDATIONS: 1. All patients should optimize calcium and vitamin D intake. 2. Consider FDA-approved medical therapies in postmenopausal women and men aged 53 years and older, based on the following: a. A hip or vertebral(clinical or morphometric) fracture b. T-score < -2.5 at the femoral neck or spine after appropriate evaluation to exclude secondary causes c. Low bone mass (T-score between -1.0 and -2.5 at the femoral neck or spine) and a 10-year probability of a hip fracture > 3% or a 10-year probability of a major osteoporosis-related fracture > 20% based on the [place]-adapted WHO algorithm 3. Clinician judgment and/or patient preferences may indicate treatment for people with 10-year fracture probabilities above or below these levels FOLLOW-UP: People with diagnosed cases of osteoporosis or at high risk for fracture should have regular bone mineral density tests. For patients eligible for Medicare, routine testing is allowed once every 2 years. The testing frequency can be increased to one year for patients who have rapidly progressing disease, those who are receiving or discontinuing medical therapy to restore bone mass, or have additional risk factors. I have reviewed this report, and agree with the above findings. [facility] Electronically Signed   By: Zerita Boers M.D.   On: 07/04/2022 13:15     Assessment and plan- Patient is a 67 y.o. 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 She is here for routine f/u  Metastatic breast cancer: I have reviewed CT chest abdomen pelvis images as well as bone scan images independently and discussed findings with the patient which presently does not show any evidence of progressive disease.  She has known duodenal wall thickening which was biopsy-proven only metastatic site which appears similar.  We are not seeing any new areas of  metastases.  On clinical exam her left breast mass is no longer palpable.  Plan is to continue letrozole and Ibrance until progression or toxicity.  Her LFTs are back to  normal today and I have asked her to restart her Ibrance at 100 mg 3 weeks on and 1 week off.  I will continue to monitor LFTs every 2 weeks for the next 2 months.  If LFTs go up again it may be due to [place] and I will lower the dose back to 75 mg which she has tolerated well.  She will also continue to be on letrozole in the meanwhile.  Obstructive jaundice: resolved. Hopefully her ptca can be taken out soon since he stent is working  I will discuss genetic testing with her at my next visit.  NGS testing did not show any actionable mutations.  Cmp every 2 weeks in 2, 4, 6 and 8 weeks. See dr Janese Banks in 8 weeks. Cbc with diff in 4 and 8 weeks. Ca 27.29 and ca 15-3 in 8 weeks   Visit Diagnosis 1. 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 Abnormal LFTs   3. High risk medication use   4. Use of letrozole ([place])      Dr. Randa Evens, MD, MPH [facility] [id number] 07/25/2022 4:05 PM

## 2022-07-27 ENCOUNTER — Other Ambulatory Visit: Payer: Self-pay | Admitting: Interventional Radiology

## 2022-07-27 DIAGNOSIS — T85590A Other mechanical complication of bile duct prosthesis, initial encounter: Secondary | ICD-10-CM

## 2022-08-08 ENCOUNTER — Inpatient Hospital Stay: Payer: Medicare HMO

## 2022-08-08 DIAGNOSIS — R7989 Other specified abnormal findings of blood chemistry: Secondary | ICD-10-CM

## 2022-08-08 DIAGNOSIS — Z170 Estrogen receptor positive status [ER+]: Secondary | ICD-10-CM | POA: Diagnosis not present

## 2022-08-08 DIAGNOSIS — Z79899 Other long term (current) drug therapy: Secondary | ICD-10-CM | POA: Diagnosis not present

## 2022-08-08 DIAGNOSIS — C50912 Malignant neoplasm of unspecified site of left female breast: Secondary | ICD-10-CM

## 2022-08-08 DIAGNOSIS — C784 Secondary malignant neoplasm of small intestine: Secondary | ICD-10-CM | POA: Diagnosis not present

## 2022-08-08 DIAGNOSIS — Z79811 Long term (current) use of aromatase inhibitors: Secondary | ICD-10-CM | POA: Diagnosis not present

## 2022-08-08 DIAGNOSIS — C50919 Malignant neoplasm of unspecified site of unspecified female breast: Secondary | ICD-10-CM | POA: Diagnosis not present

## 2022-08-08 LAB — CBC WITH DIFFERENTIAL/PLATELET
Abs Immature Granulocytes: 0.04 10*3/uL (ref 0.00–0.07)
Basophils Absolute: 0 10*3/uL (ref 0.0–0.1)
Basophils Relative: 1 %
Eosinophils Absolute: 0.1 10*3/uL (ref 0.0–0.5)
Eosinophils Relative: 2 %
HCT: 38.4 % (ref 36.0–46.0)
Hemoglobin: 13.1 g/dL (ref 12.0–15.0)
Immature Granulocytes: 1 %
Lymphocytes Relative: 33 %
Lymphs Abs: 1.4 10*3/uL (ref 0.7–4.0)
MCH: 32.7 pg (ref 26.0–34.0)
MCHC: 34.1 g/dL (ref 30.0–36.0)
MCV: 95.8 fL (ref 80.0–100.0)
Monocytes Absolute: 0.2 10*3/uL (ref 0.1–1.0)
Monocytes Relative: 4 %
Neutro Abs: 2.5 10*3/uL (ref 1.7–7.7)
Neutrophils Relative %: 59 %
Platelets: 236 10*3/uL (ref 150–400)
RBC: 4.01 MIL/uL (ref 3.87–5.11)
RDW: 13.5 % (ref 11.5–15.5)
Smear Review: NORMAL
WBC: 4.2 10*3/uL (ref 4.0–10.5)
nRBC: 0 % (ref 0.0–0.2)

## 2022-08-08 LAB — COMPREHENSIVE METABOLIC PANEL
ALT: 20 U/L (ref 0–44)
AST: 20 U/L (ref 15–41)
Albumin: 4.3 g/dL (ref 3.5–5.0)
Alkaline Phosphatase: 77 U/L (ref 38–126)
Anion gap: 8 (ref 5–15)
BUN: 22 mg/dL (ref 8–23)
CO2: 25 mmol/L (ref 22–32)
Calcium: 9.5 mg/dL (ref 8.9–10.3)
Chloride: 101 mmol/L (ref 98–111)
Creatinine, Ser: 1.14 mg/dL — ABNORMAL HIGH (ref 0.44–1.00)
GFR, Estimated: 52 mL/min — ABNORMAL LOW (ref >60)
Glucose, Bld: 141 mg/dL — ABNORMAL HIGH (ref 70–99)
Potassium: 3.6 mmol/L (ref 3.5–5.1)
Sodium: 134 mmol/L — ABNORMAL LOW (ref 135–145)
Total Bilirubin: 0.6 mg/dL (ref 0.3–1.2)
Total Protein: 7.8 g/dL (ref 6.5–8.1)

## 2022-08-10 ENCOUNTER — Telehealth: Payer: Self-pay

## 2022-08-10 ENCOUNTER — Other Ambulatory Visit (HOSPITAL_COMMUNITY): Payer: Self-pay

## 2022-08-10 ENCOUNTER — Other Ambulatory Visit: Payer: Self-pay | Admitting: Pharmacist

## 2022-08-10 ENCOUNTER — Other Ambulatory Visit: Payer: Self-pay

## 2022-08-10 DIAGNOSIS — C50912 Malignant neoplasm of unspecified site of left female breast: Secondary | ICD-10-CM

## 2022-08-10 MED ORDER — PALBOCICLIB 100 MG PO TABS
100.0000 mg | ORAL_TABLET | Freq: Every day | ORAL | 0 refills | Status: DC
  Filled 2022-08-10: qty 21, 21d supply, fill #0
  Filled 2022-08-10 (×2): qty 21, 28d supply, fill #0

## 2022-08-10 NOTE — Telephone Encounter (Signed)
Oral Oncology Patient Advocate Encounter  Re-authorization   Received notification that prior authorization for Diamond Hinton is due for renewal.   PA submitted on 08/10/2022  Key [id number]  Status is pending     [name], [name] Patient [place]  [phone] (phone) [phone] (fax) 08/10/2022 12:35 PM

## 2022-08-10 NOTE — Telephone Encounter (Signed)
Oral Oncology Patient Advocate Encounter  Prior Authorization for Diamond Hinton has been approved.    PA# [id number]  Effective dates: 06/12/2022 through 06/12/2023  Patients co-pay is $3,314.57.  Patient has [name] on file to make co-pay $0.     [name], [place] Patient [place]  [phone] (phone) [phone] (fax) 08/10/2022 12:44 PM

## 2022-08-14 ENCOUNTER — Telehealth: Payer: Self-pay | Admitting: Oncology

## 2022-08-14 ENCOUNTER — Ambulatory Visit (INDEPENDENT_AMBULATORY_CARE_PROVIDER_SITE_OTHER): Payer: Medicare HMO | Admitting: Family Medicine

## 2022-08-14 DIAGNOSIS — R319 Hematuria, unspecified: Secondary | ICD-10-CM

## 2022-08-14 DIAGNOSIS — R509 Fever, unspecified: Secondary | ICD-10-CM | POA: Diagnosis not present

## 2022-08-14 DIAGNOSIS — M546 Pain in thoracic spine: Secondary | ICD-10-CM

## 2022-08-14 LAB — POCT URINALYSIS DIPSTICK
Bilirubin, UA: NEGATIVE
Glucose, UA: NEGATIVE
Ketones, UA: NEGATIVE
Leukocytes, UA: NEGATIVE
Nitrite, UA: NEGATIVE
Protein, UA: NEGATIVE
Spec Grav, UA: 1.01 (ref 1.010–1.025)
Urobilinogen, UA: 0.2 E.U./dL
pH, UA: 6 (ref 5.0–8.0)

## 2022-08-14 NOTE — Progress Notes (Signed)
I,Sulibeya S Dimas,acting as a scribe for Lavon Paganini, MD.,have documented all relevant documentation on the behalf of Lavon Paganini, MD,as directed by  Lavon Paganini, MD while in the presence of Lavon Paganini, MD.     Established patient visit   Patient: Diamond Hinton   DOB: 10-18-1955   67 y.o. Female  MRN: [id number] Visit Date: 08/14/2022  Today's healthcare provider: Lavon Paganini, MD   Chief Complaint  Patient presents with   Urinary Tract Infection   Subjective    HPI   --------------------------------------------------------------------------------------- L flank pain, heating pad helped initially - then chills, fever (Tmax 100.12f. No fever today, no more back pain Hooked biliary drain back to bag (was capped) just in case  Possible urinary odor x1, no other urinary symptoms. Just wants to make sure  Medications: Outpatient Medications Prior to Visit  Medication Sig   acetaminophen (TYLENOL) 325 MG tablet Take 325 mg by mouth every 6 (six) hours as needed (for pain and sometimes takes 2 if pain is worse). (Patient not taking: Reported on 07/25/2022)   hydrochlorothiazide (HYDRODIURIL) 25 MG tablet Take 1 tablet (25 mg total) by mouth daily. (Patient not taking: Reported on 07/24/2022)   letrozole (FEMARA) 2.5 MG tablet Take 1 tablet (2.5 mg total) by mouth daily.   levothyroxine (SYNTHROID) 88 MCG tablet Take 1 tablet by mouth once daily   Multiple Vitamins-Minerals (CENTRUM VITAMINTS PO) Take 1 tablet by mouth daily.   ondansetron (ZOFRAN) 4 MG tablet Take 1 tablet (4 mg total) by mouth daily as needed for nausea or vomiting.   oxyCODONE (ROXICODONE) 5 MG immediate release tablet Take 1 tablet (5 mg total) by mouth every 4 (four) hours as needed for severe pain.   palbociclib (IBRANCE) 100 MG tablet Take 1 tablet (100 mg total) by mouth daily. Take for 21 days on, 7 days off, repeat every 28 days.   prochlorperazine (COMPAZINE) 5 MG tablet Take 1  tablet (5 mg total) by mouth every 6 (six) hours as needed for refractory nausea / vomiting.   No facility-administered medications prior to visit.    Review of Systems  Constitutional:  Negative for chills and fever.  Gastrointestinal:  Negative for abdominal pain, nausea and vomiting.  Genitourinary:  Positive for dysuria, flank pain, frequency and hematuria.       Objective    There were no vitals taken for this visit.   Physical Exam Vitals reviewed.  Constitutional:      General: She is not in acute distress.    Appearance: Normal appearance. She is well-developed. She is not diaphoretic.  HENT:     Head: Normocephalic and atraumatic.  Eyes:     General: No scleral icterus.    Conjunctiva/sclera: Conjunctivae normal.  Neck:     Thyroid: No thyromegaly.  Cardiovascular:     Rate and Rhythm: Normal rate and regular rhythm.     Heart sounds: Normal heart sounds.  Pulmonary:     Effort: Pulmonary effort is normal. No respiratory distress.     Breath sounds: Normal breath sounds. No wheezing, rhonchi or rales.  Abdominal:     General: There is no distension.     Palpations: Abdomen is soft.     Tenderness: There is no abdominal tenderness.  Musculoskeletal:     Cervical back: Neck supple.     Right lower leg: No edema.     Left lower leg: No edema.     Comments: Back: No TTP, no CVAT  Lymphadenopathy:  Cervical: No cervical adenopathy.  Skin:    General: Skin is warm and dry.     Findings: No rash.  Neurological:     Mental Status: She is alert and oriented to person, place, and time. Mental status is at baseline.  Psychiatric:        Mood and Affect: Mood normal.        Behavior: Behavior normal.       Results for orders placed or performed in visit on 08/14/22  POCT urinalysis dipstick  Result Value Ref Range   Color, UA yellow    Clarity, UA clear    Glucose, UA Negative Negative   Bilirubin, UA Negative    Ketones, UA Negative    Spec Grav, UA  1.010 1.010 - 1.025   Blood, UA moderate    pH, UA 6.0 5.0 - 8.0   Protein, UA Negative Negative   Urobilinogen, UA 0.2 0.2 or 1.0 E.U./dL   Nitrite, UA Negative    Leukocytes, UA Negative Negative    Assessment & Plan     1. Hematuria, unspecified type - no signs of UTI on UA today - will confirm hematuria with UA microscopy - will send urine micro to confirm and will plan to recheck urine in about 6 weeks to ensure hematuria has cleared -Discussed return precautions   - POCT urinalysis dipstick - Urinalysis, microscopic only  2. Acute left-sided thoracic back pain - now resolved - likely MSK pain as improved with position changes - no sgns of pyelo - consider nephrolithiasis in setting of hematuria as above, but has already resolved, so no need for additional imaging at this time - POCT urinalysis dipstick  3. Fever, unspecified fever cause - new problem, now resolved - likely viral infection as no signs of cellulitis, AOM, strep pharyngitis, UTI, CAP, or other bacterial source on exam. - return precautions discussed - POCT urinalysis dipstick   No follow-ups on file.      I, Lavon Paganini, MD, have reviewed all documentation for this visit. The documentation on 08/14/22 for the exam, diagnosis, procedures, and orders are all accurate and complete.   Laguana Desautel, Dionne Bucy, MD, MPH [facility]

## 2022-08-14 NOTE — Telephone Encounter (Signed)
Patient reports feeling some pressure of her tubes, so she re- attached and has had 100cc output.  One thermometer measured 99, one thermometer measured 100.5, when she was on the phone with me, she repeat temperature and both reading were in 99. Some chills. She feels better after unclamp her tube. Cbc done a few days ago showed no signs of leukopenia.  I recommend patient to monitor her temperature closely, do not take Tylenol as it may mask her symptoms. She should go to ER if she develops symptoms.  Cc Dr. Janese Banks to follow up next week.

## 2022-08-15 LAB — URINALYSIS, MICROSCOPIC ONLY
Bacteria, UA: NONE SEEN
Casts: NONE SEEN /lpf
RBC, Urine: NONE SEEN /hpf (ref 0–2)
WBC, UA: NONE SEEN /hpf (ref 0–5)

## 2022-08-17 ENCOUNTER — Ambulatory Visit
Admission: RE | Admit: 2022-08-17 | Discharge: 2022-08-17 | Disposition: A | Discharge status: Home / Self Care | Payer: Medicare HMO | Source: Ambulatory Visit | Attending: Interventional Radiology | Admitting: Interventional Radiology

## 2022-08-17 ENCOUNTER — Other Ambulatory Visit: Payer: Self-pay | Admitting: Interventional Radiology

## 2022-08-17 ENCOUNTER — Encounter: Payer: Self-pay | Admitting: Interventional Radiology

## 2022-08-17 ENCOUNTER — Ambulatory Visit
Admission: RE | Admit: 2022-08-17 | Discharge: 2022-08-17 | Disposition: A | Discharge status: Home / Self Care | Payer: Medicare HMO | Source: Ambulatory Visit | Attending: Internal Medicine | Admitting: Internal Medicine

## 2022-08-17 DIAGNOSIS — K831 Obstruction of bile duct: Secondary | ICD-10-CM

## 2022-08-17 DIAGNOSIS — T85590A Other mechanical complication of bile duct prosthesis, initial encounter: Secondary | ICD-10-CM

## 2022-08-17 DIAGNOSIS — C50919 Malignant neoplasm of unspecified site of unspecified female breast: Secondary | ICD-10-CM | POA: Diagnosis not present

## 2022-08-17 DIAGNOSIS — C784 Secondary malignant neoplasm of small intestine: Secondary | ICD-10-CM | POA: Diagnosis not present

## 2022-08-17 DIAGNOSIS — T85590D Other mechanical complication of bile duct prosthesis, subsequent encounter: Secondary | ICD-10-CM

## 2022-08-17 DIAGNOSIS — Z452 Encounter for adjustment and management of vascular access device: Secondary | ICD-10-CM | POA: Diagnosis not present

## 2022-08-17 HISTORY — PX: IR RADIOLOGIST EVAL & MGMT: IMG5224

## 2022-08-17 HISTORY — PX: IR CHOLANGIOGRAM EXISTING TUBE: IMG6040

## 2022-08-17 NOTE — Progress Notes (Signed)
Chief Complaint: Patient was seen in consultation today for biliary drain malfunction at the request of [place] T  Referring Physician(s): Clapp,Susan T  History of Present Illness: Diamond Hinton is a 67 y.o. 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  She most recently underwent placement of a right-sided internal/external biliary drainage catheter on 07/17/2022.  She was doing well with her tube capped at home until this past weekend when she developed a low-grade fever and malaise.  She connected her drain to bag drainage until Tuesday when she recapped it.  She presents today for drain injection and further evaluation.  I performed a drain injection which reveals that her tube is occluded along the internal portion.  The biliary stents remain occluded as well.  She has mild biliary ductal dilatation.  Currently, she is asymptomatic and has no pain, fever, chills or other systemic symptoms.  I reconnected her drain to bag drainage.  We discussed undergoing drain exchange here in the clinic.  Unfortunately, Diamond Hinton has had very bad experiences with drain exchange and procedures in the past and feels a great deal of pain.  She would require moderate sedation to undergo drain exchange.  She was not prepared for that today.  Therefore, we will get her rescheduled for drain exchange with moderate sedation here at [place] next week.  Past Medical History:  Diagnosis Date   Anemia    Anxiety    Breast cancer ([place])    Gallstones    GERD (gastroesophageal reflux disease)    Hyperlipidemia    Hypertension    Jaundice 08/29/2021   Rash 08/29/2021   Right upper quadrant abdominal pain 08/29/2021   UTI (urinary tract infection) 08/29/2021    Past Surgical History:  Procedure Laterality Date   BILATERAL SALPINGECTOMY  04/03/1997   CHOLECYSTECTOMY N/A 07/07/2015   Procedure:  LAPAROSCOPIC CHOLECYSTECTOMY ;  Surgeon: Jules Husbands, MD;  Location: [facility] ORS;  Service: General;  Laterality: N/A;   ESOPHAGOGASTRODUODENOSCOPY  08/30/2021   Procedure: ESOPHAGOGASTRODUODENOSCOPY (EGD);  Surgeon: Lucilla Lame, MD;  Location[facility] END[facility]COPY;  Service: Endoscopy;;   GALLBLADDER SURGERY  07/07/2015   [facility]   IR BILIARY DRAIN PLACEMENT WITH CHOLANGIOGRAM  08/31/2021   IR BILIARY STENT(S) NEW ACCESS WITH DRAIN  02/08/2022   IR CONVERT BILIARY DRAIN TO INT EXT BILIARY DRAIN  09/02/2021   IR EXCHANGE BILIARY DRAIN  10/10/2021   IR EXCHANGE BILIARY DRAIN  12/05/2021   IR EXCHANGE BILIARY DRAIN  01/19/2022   IR INT EXT BILIARY DRAIN WITH CHOLANGIOGRAM  07/17/2022   IR RADIOLOGIST EVAL & MGMT  03/01/2022   IR RADIOLOGIST EVAL & MGMT  08/17/2022    Allergies: Peanuts [peanut oil]  Medications: Prior to Admission medications   Medication Sig Start Date End Date Taking? Authorizing Provider  acetaminophen (TYLENOL) 325 MG tablet Take 325 mg by mouth every 6 (six) hours as needed (for pain and sometimes takes 2 if pain is worse). Patient not taking: Reported on 07/25/2022    [provider]  hydrochlorothiazide (HYDRODIURIL) 25 MG tablet Take 1 tablet (25 mg total) by mouth daily. Patient not taking: Reported on 07/24/2022 04/20/22   Virginia Crews, MD  letrozole [facility]) 2.5 MG tablet Take 1 tablet (2.5 mg total) by mouth daily. 06/16/22   Sindy Guadeloupe, MD  levothyroxine (SYNTHROID) 88 MCG tablet Take 1 tablet by mouth once daily 06/30/22   Bacigalupo,  Dionne Bucy, MD  Multiple Vitamins-Minerals (CENTRUM VITAMINTS PO) Take 1 tablet by mouth daily.    [provider]  ondansetron (ZOFRAN) 4 MG tablet Take 1 tablet (4 mg total) by mouth daily as needed for nausea or vomiting. 02/14/22 02/14/23  Sindy Guadeloupe, MD  oxyCODONE (ROXICODONE) 5 MG immediate release tablet Take 1 tablet (5 mg total) by mouth every 4 (four) hours as needed for severe pain. 07/24/22   Bacigalupo, Dionne Bucy, MD   palbociclib (IBRANCE) 100 MG tablet Take 1 tablet (100 mg total) by mouth daily. Take for 21 days on, 7 days off, repeat every 28 days. 08/10/22   Sindy Guadeloupe, MD  prochlorperazine (COMPAZINE) 5 MG tablet Take 1 tablet (5 mg total) by mouth every 6 (six) hours as needed for refractory nausea / vomiting. 02/14/22   Sindy Guadeloupe, MD     Family History  Problem Relation Age of Onset   Hypertension Mother    CVA Mother    Ulcers Mother    Emphysema Mother    Heart disease Father    Prostate cancer Father    Macular degeneration Father    Hypertension Sister    Diabetes Sister    Cervical polyp Sister    Hypertension Sister    Diabetes Sister    Lymphoma Sister     Social History   Socioeconomic History   Marital status: Married    Spouse name: Not on file   Number of children: Not on file   Years of education: Not on file   Highest education level: Not on file  Occupational History   Not on file  Tobacco Use   Smoking status: Never   Smokeless tobacco: Never  Vaping Use   Vaping Use: Never used  Substance and Sexual Activity   Alcohol use: No   Drug use: No   Sexual activity: Yes  Other Topics Concern   Not on file  Social History Narrative   Not on file   Social Determinants of Health   Financial Resource Strain: Low Risk  (11/24/2021)   Overall Financial Resource Strain (CARDIA)    Difficulty of Paying Living Expenses: Not hard at all  Food Insecurity: No Food Insecurity (07/20/2022)   Hunger Vital Sign    Worried About Running Out of Food in the Last Year: Never true    Ran Out of Food in the Last Year: Never true  Transportation Needs: No Transportation Needs (07/20/2022)   PRAPARE - Hydrologist (Medical): No    Lack of Transportation (Non-Medical): No  Physical Activity: Not on file  Stress: No Stress Concern Present (11/24/2021)   [place]    Feeling of Stress  : Only a little  Social Connections: Unknown (11/24/2021)   Social Connection and Isolation Panel [NHANES]    Frequency of Communication with Friends and Family: Three times a week    Frequency of Social Gatherings with Friends and Family: Three times a week    Attends Religious Services: Patient refused    Active Member of Clubs or Organizations: Patient refused    Attends Archivist Meetings: Patient refused    Marital Status: Married    ECOG Status: 1 - Symptomatic but completely ambulatory  Review of Systems: A 12 point ROS discussed and pertinent positives are indicated in the HPI above.  All other systems are negative.  Review of Systems  Vital Signs: There  were no vitals taken for this visit.   Physical Exam Constitutional:      General: She is not in acute distress.    Appearance: Normal appearance. She is normal weight. She is not toxic-appearing.  HENT:     Head: Normocephalic and atraumatic.  Eyes:     General: No scleral icterus. Cardiovascular:     Rate and Rhythm: Normal rate.  Pulmonary:     Effort: Pulmonary effort is normal.  Abdominal:     General: Abdomen is flat. There is no distension.     Tenderness: There is no abdominal tenderness.  Skin:    General: Skin is warm and dry.  Neurological:     Mental Status: She is alert and oriented to person, place, and time.  Psychiatric:        Behavior: Behavior normal.       Imaging: IR Radiologist Eval & Mgmt  Result Date: 08/17/2022 EXAM: ESTABLISHED PATIENT OFFICE VISIT CHIEF COMPLAINT: SEE EPIC NOTE HISTORY OF PRESENT ILLNESS: SEE EPIC NOTE REVIEW OF SYSTEMS: SEE EPIC NOTE PHYSICAL EXAMINATION: SEE EPIC NOTE ASSESSMENT AND PLAN: SEE EPIC NOTE Electronically Signed   By: Jacqulynn Cadet M.D.   On: 08/17/2022 14:30   NM Bone Scan Whole Body  Result Date: 07/25/2022 CLINICAL DATA:  Breast cancer EXAM: NUCLEAR MEDICINE WHOLE BODY BONE SCAN TECHNIQUE: Whole body anterior and posterior images  were obtained approximately 3 hours after intravenous injection of radiopharmaceutical. RADIOPHARMACEUTICALS:  21.9 mCi Technetium-14mMDP IV COMPARISON:  None Available. FINDINGS: Bilateral shoulder, sternomanubrial, hip and ankle activity noted which are typically degenerative findings. There is also probable degenerative uptake noted in the midcervical spine on the right. There is bilateral renal activity. IMPRESSION: Nonspecific multifocal activity which is probably degenerative. No definite osseous metastatic disease. Electronically Signed   By: JSammie BenchM.D.   On: 07/25/2022 11:27    Labs:  CBC: Recent Labs    07/10/22 1440 07/16/22 1949 07/17/22 0505 08/08/22 1117  WBC 5.6 9.8 8.5 4.2  HGB 12.5 13.3 12.2 13.1  HCT 35.1* 38.3 34.6* 38.4  PLT 234 350 279 236    COAGS: Recent Labs    08/31/21 0859 10/07/21 2246 02/08/22 0918  INR 1.1 1.2 1.3*    BMP: Recent Labs    07/17/22 0505 07/18/22 0237 07/25/22 1103 08/08/22 1117  NA 138 134* 138 134*  K 3.4* 3.6 3.5 3.6  CL 99 103 104 101  CO2 '26 23 26 25  '$ GLUCOSE 146* 107* 165* 141*  BUN '16 20 15 22  '$ CALCIUM 9.7 8.7* 9.1 9.5  CREATININE 0.95 1.20* 0.90 1.14*  GFRNONAA >60 49* >60 52*    LIVER FUNCTION TESTS: Recent Labs    07/17/22 0505 07/18/22 0237 07/25/22 1103 08/08/22 1117  BILITOT 3.9* 1.5* 0.4 0.6  AST 554* 155* 19 20  ALT 677* 365* 42 20  ALKPHOS 284* 220* 127* 77  PROT 6.5 6.1* 6.9 7.8  ALBUMIN 3.4* 3.1* 3.6 4.3    TUMOR MARKERS: No results for input(s): "AFPTM", "CEA", "CA199", "CHROMGRNA" in the last 8760 hours.  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  Her previously placed internal/external biliary drainage catheter is occluded and not draining internally.  She requires tube exchange but also requires this to be done with moderate sedation.  1.)  Schedule for biliary drain  [place] with [place]  Electronically Signed: Criselda Peaches 08/17/2022, 2:33 PM  I spent a total of  15 Minutes in face to face in clinical consultation, greater than 50% of which was counseling/coordinating care for biliary drain care

## 2022-08-18 ENCOUNTER — Other Ambulatory Visit: Payer: Self-pay | Admitting: Interventional Radiology

## 2022-08-18 DIAGNOSIS — T85590D Other mechanical complication of bile duct prosthesis, subsequent encounter: Secondary | ICD-10-CM

## 2022-08-18 DIAGNOSIS — K831 Obstruction of bile duct: Secondary | ICD-10-CM

## 2022-08-21 ENCOUNTER — Inpatient Hospital Stay: Payer: Medicare HMO | Attending: Oncology

## 2022-08-21 ENCOUNTER — Other Ambulatory Visit: Payer: Medicare HMO

## 2022-08-21 ENCOUNTER — Other Ambulatory Visit (HOSPITAL_COMMUNITY): Payer: Self-pay | Admitting: Student

## 2022-08-21 DIAGNOSIS — C784 Secondary malignant neoplasm of small intestine: Secondary | ICD-10-CM | POA: Insufficient documentation

## 2022-08-21 DIAGNOSIS — C50919 Malignant neoplasm of unspecified site of unspecified female breast: Secondary | ICD-10-CM | POA: Insufficient documentation

## 2022-08-21 DIAGNOSIS — C50912 Malignant neoplasm of unspecified site of left female breast: Secondary | ICD-10-CM

## 2022-08-21 DIAGNOSIS — R7989 Other specified abnormal findings of blood chemistry: Secondary | ICD-10-CM

## 2022-08-21 DIAGNOSIS — Z79899 Other long term (current) drug therapy: Secondary | ICD-10-CM

## 2022-08-21 LAB — CBC WITH DIFFERENTIAL/PLATELET
Abs Immature Granulocytes: 0.03 10*3/uL (ref 0.00–0.07)
Basophils Absolute: 0.1 10*3/uL (ref 0.0–0.1)
Basophils Relative: 1 %
Eosinophils Absolute: 0 10*3/uL (ref 0.0–0.5)
Eosinophils Relative: 1 %
HCT: 36.3 % (ref 36.0–46.0)
Hemoglobin: 12.7 g/dL (ref 12.0–15.0)
Immature Granulocytes: 1 %
Lymphocytes Relative: 38 %
Lymphs Abs: 2.5 10*3/uL (ref 0.7–4.0)
MCH: 32.4 pg (ref 26.0–34.0)
MCHC: 35 g/dL (ref 30.0–36.0)
MCV: 92.6 fL (ref 80.0–100.0)
Monocytes Absolute: 0.4 10*3/uL (ref 0.1–1.0)
Monocytes Relative: 6 %
Neutro Abs: 3.5 10*3/uL (ref 1.7–7.7)
Neutrophils Relative %: 53 %
Platelets: 284 10*3/uL (ref 150–400)
RBC: 3.92 MIL/uL (ref 3.87–5.11)
RDW: 14.7 % (ref 11.5–15.5)
WBC: 6.5 10*3/uL (ref 4.0–10.5)
nRBC: 0 % (ref 0.0–0.2)

## 2022-08-21 LAB — COMPREHENSIVE METABOLIC PANEL
ALT: 24 U/L (ref 0–44)
AST: 18 U/L (ref 15–41)
Albumin: 4.3 g/dL (ref 3.5–5.0)
Alkaline Phosphatase: 77 U/L (ref 38–126)
Anion gap: 9 (ref 5–15)
BUN: 19 mg/dL (ref 8–23)
CO2: 20 mmol/L — ABNORMAL LOW (ref 22–32)
Calcium: 9.1 mg/dL (ref 8.9–10.3)
Chloride: 104 mmol/L (ref 98–111)
Creatinine, Ser: 1.14 mg/dL — ABNORMAL HIGH (ref 0.44–1.00)
GFR, Estimated: 52 mL/min — ABNORMAL LOW (ref >60)
Glucose, Bld: 198 mg/dL — ABNORMAL HIGH (ref 70–99)
Potassium: 3.6 mmol/L (ref 3.5–5.1)
Sodium: 133 mmol/L — ABNORMAL LOW (ref 135–145)
Total Bilirubin: 0.5 mg/dL (ref 0.3–1.2)
Total Protein: 7.6 g/dL (ref 6.5–8.1)

## 2022-08-21 NOTE — Progress Notes (Signed)
Patient for IR Bil Drain Exchange on Tues 08/22/2022, I called and spoke with the patient on the phone and gave pre-procedure instructions. Pt was made aware to be here at 11a, NPO after MN prior to procedure as well as driver post procedure/recovery/discharge. Pt stated understanding.  Called 08/18/2022 and 08/21/2022

## 2022-08-21 NOTE — H&P (Signed)
Chief Complaint: Patient was seen in consultation today for gastric outlet obstruction and biliary obstruction at the request of [place]  Referring Physician(s): Corrie Mckusick  Supervising Physician: Ruthann Cancer  Patient Status: [facility] - Out-pt  History of Present Illness: Diamond Hinton is a 67 y.o. female well-known to IR service having underwent placement of right-sided internal/external biliary drainage catheter 07/17/22. Upon discharge from [facility], drain was capped and pt was provided with gravity bag. At home, she developed low-grade fever and malaise. She connected to gravity bag and then recapped a few days later. She presented to IR 08/17/22 for evaluation of drain that revealed drain and biliary stent occlusion. She was reconnected to gravity bag and advised that she will need drain exchange. Due to pt having painful experiences prior with exchanges, she presents to IR today for biliary drain exchange with moderate sedation.   Patient denies chills, fever, appetite change, fatigue, SOB, CP, nausea, vomiting, dizziness, headaches or weakness.   She endorses intermittent abdominal pain and back spasms. She is n.p.o. per order. Patient expresses concern for postprocedural pain and requests pain medication due to previous experience with fentanyl and Versed not being effective.  Past Medical History:  Diagnosis Date   Anemia    Anxiety    Breast cancer ([place])    Gallstones    GERD (gastroesophageal reflux disease)    Hyperlipidemia    Hypertension    Jaundice 08/29/2021   Rash 08/29/2021   Right upper quadrant abdominal pain 08/29/2021   UTI (urinary tract infection) 08/29/2021    Past Surgical History:  Procedure Laterality Date   BILATERAL SALPINGECTOMY  04/03/1997   CHOLECYSTECTOMY N/A 07/07/2015   Procedure: LAPAROSCOPIC CHOLECYSTECTOMY ;  Surgeon: Jules Husbands, MD;  Location: [facility] ORS;  Service: General;  Laterality: N/A;   ESOPHAGOGASTRODUODENOSCOPY  08/30/2021    Procedure: ESOPHAGOGASTRODUODENOSCOPY (EGD);  Surgeon: Lucilla Lame, MD;  Location: [facility] ENDOSCOPY;  Service: Endoscopy;;   GALLBLADDER SURGERY  07/07/2015   [facility]   IR BILIARY DRAIN PLACEMENT WITH CHOLANGIOGRAM  08/31/2021   IR BILIARY STENT(S) NEW ACCESS WITH DRAIN  02/08/2022   IR CHOLANGIOGRAM EXISTING TUBE  08/17/2022   IR CONVERT BILIARY DRAIN TO INT EXT BILIARY DRAIN  09/02/2021   IR EXCHANGE BILIARY DRAIN  10/10/2021   IR EXCHANGE BILIARY DRAIN  12/05/2021   IR EXCHANGE BILIARY DRAIN  01/19/2022   IR INT EXT BILIARY DRAIN WITH CHOLANGIOGRAM  07/17/2022   IR RADIOLOGIST EVAL & MGMT  03/01/2022   IR RADIOLOGIST EVAL & MGMT  08/17/2022    Allergies: Peanuts [peanut oil]  Medications: Prior to Admission medications   Medication Sig Start Date End Date Taking? Authorizing Provider  acetaminophen (TYLENOL) 325 MG tablet Take 325 mg by mouth every 6 (six) hours as needed (for pain and sometimes takes 2 if pain is worse).    [provider]  hydrochlorothiazide (HYDRODIURIL) 25 MG tablet Take 1 tablet (25 mg total) by mouth daily. Patient not taking: Reported on 07/24/2022 04/20/22   Virginia Crews, MD  letrozole [facility]) 2.5 MG tablet Take 1 tablet (2.5 mg total) by mouth daily. 06/16/22   Sindy Guadeloupe, MD  levothyroxine (SYNTHROID) 88 MCG tablet Take 1 tablet by mouth once daily 06/30/22   Virginia Crews, MD  Multiple Vitamins-Minerals (CENTRUM VITAMINTS PO) Take 1 tablet by mouth daily.    [provider]  ondansetron (ZOFRAN) 4 MG tablet Take 1 tablet (4 mg total) by mouth daily as needed for nausea or vomiting.  02/14/22 02/14/23  Sindy Guadeloupe, MD  oxyCODONE (ROXICODONE) 5 MG immediate release tablet Take 1 tablet (5 mg total) by mouth every 4 (four) hours as needed for severe pain. 07/24/22   Bacigalupo, Dionne Bucy, MD  palbociclib (IBRANCE) 100 MG tablet Take 1 tablet (100 mg total) by mouth daily. Take for 21 days on, 7 days off, repeat every 28 days. 08/10/22   Sindy Guadeloupe, MD  pantoprazole (PROTONIX) 40 MG tablet Take 40 mg by mouth daily.    [provider]  prochlorperazine (COMPAZINE) 5 MG tablet Take 1 tablet (5 mg total) by mouth every 6 (six) hours as needed for refractory nausea / vomiting. 02/14/22   Sindy Guadeloupe, MD     Family History  Problem Relation Age of Onset   Hypertension Mother    CVA Mother    Ulcers Mother    Emphysema Mother    Heart disease Father    Prostate cancer Father    Macular degeneration Father    Hypertension Sister    Diabetes Sister    Cervical polyp Sister    Hypertension Sister    Diabetes Sister    Lymphoma Sister     Social History   Socioeconomic History   Marital status: Married    Spouse name: Mariea Clonts   Number of children: 4   Years of education: Not on file   Highest education level: Not on file  Occupational History   Not on file  Tobacco Use   Smoking status: Never   Smokeless tobacco: Never  Vaping Use   Vaping Use: Never used  Substance and Sexual Activity   Alcohol use: No   Drug use: No   Sexual activity: Yes  Other Topics Concern   Not on file  Social History Narrative   Lives with spouse at home: Mariea Clonts has Parkinson's    Social Determinants of Health   Financial Resource Strain: Low Risk  (11/24/2021)   Overall Financial Resource Strain (CARDIA)    Difficulty of Paying Living Expenses: Not hard at all  Food Insecurity: No Food Insecurity (07/20/2022)   Hunger Vital Sign    Worried About Running Out of Food in the Last Year: Never true    [place] in the Last Year: Never true  Transportation Needs: No Transportation Needs (07/20/2022)   PRAPARE - Hydrologist (Medical): No    Lack of Transportation (Non-Medical): No  Physical Activity: Not on file  Stress: No Stress Concern Present (11/24/2021)   [place]    Feeling of Stress : Only a little  Social Connections:  Unknown (11/24/2021)   Social Connection and Isolation Panel [NHANES]    Frequency of Communication with Friends and Family: Three times a week    Frequency of Social Gatherings with Friends and Family: Three times a week    Attends Religious Services: Patient refused    Active Member of Clubs or Organizations: Patient refused    Attends Archivist Meetings: Patient refused    Marital Status: Married    Review of Systems: A 12 point ROS discussed and pertinent positives are indicated in the HPI above.  All other systems are negative.  Review of Systems  Constitutional:  Negative for appetite change, chills, fatigue and fever.  Respiratory:  Negative for shortness of breath.   Cardiovascular:  Negative for chest pain.  Gastrointestinal:  Positive for abdominal pain. Negative  for nausea and vomiting.  Musculoskeletal:        Back spasms  Neurological:  Negative for dizziness, weakness and headaches.    Vital Signs: BP 134/81   Pulse 89   Temp 98.5 F (36.9 C) (Oral)   Resp 20   Ht '5\' 3"'$  (1.6 m)   Wt 141 lb 1.5 oz (64 kg)   SpO2 95%   BMI 24.99 kg/m     Physical Exam Vitals reviewed.  Constitutional:      Appearance: Normal appearance.  HENT:     Head: Normocephalic and atraumatic.     Mouth/Throat:     Mouth: Mucous membranes are dry.     Pharynx: Oropharynx is clear.  Eyes:     Extraocular Movements: Extraocular movements intact.     Pupils: Pupils are equal, round, and reactive to light.  Cardiovascular:     Rate and Rhythm: Normal rate and regular rhythm.     Pulses: Normal pulses.     Heart sounds: Normal heart sounds.  Pulmonary:     Effort: Pulmonary effort is normal. No respiratory distress.     Breath sounds: Normal breath sounds.  Abdominal:     General: Bowel sounds are normal. There is no distension.     Palpations: Abdomen is soft.     Tenderness: There is no abdominal tenderness. There is no guarding.     Comments: + biliary drain in  place to gravity bag with bilious OP  Musculoskeletal:     Right lower leg: No edema.     Left lower leg: No edema.  Skin:    General: Skin is warm and dry.  Neurological:     Mental Status: She is alert and oriented to person, place, and time.  Psychiatric:        Mood and Affect: Mood normal.        Behavior: Behavior normal.        Thought Content: Thought content normal.        Judgment: Judgment normal.     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 She presents with complaints of low-grade fever requiring connection of her biliary drain to gravity. EXAM: CHOLANGIOGRAM VIA EXISTING CATHETER MEDICATIONS: None. ANESTHESIA/SEDATION: None. FLUOROSCOPY TIME:  Radiation exposure index: 0.7 mGy COMPLICATIONS: None immediate. PROCEDURE: Informed written consent was obtained from the patient after a thorough discussion of the procedural risks, benefits and alternatives. All questions were addressed. Maximal Sterile Barrier Technique was utilized including caps, mask, sterile gowns, sterile gloves, sterile drape, hand hygiene and skin antiseptic. A timeout was performed prior to the initiation of the procedure. Contrast was injected through the existing catheter. The external portion of the catheter is patent. However, once the catheter enters the stent system it is completely occluded. Additionally, the stent system is completely occluded. There is mild hepatic biliary ductal dilatation. Debris is identified within the dilated common bile duct. IMPRESSION: 1. Completely occluded internal portion of internal/external biliary stent. Additionally, the biliary stent system is also completely occluded. No contrast material enters the duodenum. 2. Mild to moderate biliary ductal dilatation. PLAN: Patient requires moderate sedation for tube  exchanges. We will have the patient scheduled to return next week for exchange of her internal/external biliary drainage catheter. If she continues to experience early occlusion of the tube, we will likely need to upsize to 12 or 14 [place].  Electronically Signed   By: Jacqulynn Cadet M.D.   On: 08/17/2022 14:58   IR Radiologist Eval & Mgmt  Result Date: 08/17/2022 EXAM: ESTABLISHED PATIENT OFFICE VISIT CHIEF COMPLAINT: SEE EPIC NOTE HISTORY OF PRESENT ILLNESS: SEE EPIC NOTE REVIEW OF SYSTEMS: SEE EPIC NOTE PHYSICAL EXAMINATION: SEE EPIC NOTE ASSESSMENT AND PLAN: SEE EPIC NOTE Electronically Signed   By: Jacqulynn Cadet M.D.   On: 08/17/2022 14:30   NM Bone Scan Whole Body  Result Date: 07/25/2022 CLINICAL DATA:  Breast cancer EXAM: NUCLEAR MEDICINE WHOLE BODY BONE SCAN TECHNIQUE: Whole body anterior and posterior images were obtained approximately 3 hours after intravenous injection of radiopharmaceutical. RADIOPHARMACEUTICALS:  21.9 mCi Technetium-16mMDP IV COMPARISON:  None Available. FINDINGS: Bilateral shoulder, sternomanubrial, hip and ankle activity noted which are typically degenerative findings. There is also probable degenerative uptake noted in the midcervical spine on the right. There is bilateral renal activity. IMPRESSION: Nonspecific multifocal activity which is probably degenerative. No definite osseous metastatic disease. Electronically Signed   By: JSammie BenchM.D.   On: 07/25/2022 11:27    Labs:  CBC: Recent Labs    07/16/22 1949 07/17/22 0505 08/08/22 1117 08/21/22 1055  WBC 9.8 8.5 4.2 6.5  HGB 13.3 12.2 13.1 12.7  HCT 38.3 34.6* 38.4 36.3  PLT 350 279 236 284    COAGS: Recent Labs    08/31/21 0859 10/07/21 2246 02/08/22 0918 08/22/22 1125  INR 1.1 1.2 1.3* 1.1    BMP: Recent Labs    07/18/22 0237 07/25/22 1103 08/08/22 1117 08/21/22 1055  NA 134* 138 134* 133*  K 3.6 3.5 3.6 3.6  CL 103 104 101 104  CO2 '23 26 25 '$ 20*  GLUCOSE 107* 165* 141*  198*  BUN '20 15 22 19  '$ CALCIUM 8.7* 9.1 9.5 9.1  CREATININE 1.20* 0.90 1.14* 1.14*  GFRNONAA 49* >60 52* 52*    LIVER FUNCTION TESTS: Recent Labs    07/18/22 0237 07/25/22 1103 08/08/22 1117 08/21/22 1055  BILITOT 1.5* 0.4 0.6 0.5  AST 155* '19 20 18  '$ ALT 365* 42 20 24  ALKPHOS 220* 127* 77 77  PROT 6.1* 6.9 7.8 7.6  ALBUMIN 3.1* 3.6 4.3 4.3    TUMOR MARKERS: No results for input(s): "AFPTM", "CEA", "CA199", "CHROMGRNA" in the last 8760 hours.  Assessment and Plan:  67year old female with PMHx of breast cancer with duodenal metastases, gastric outlet obstruction and biliary obstruction presents today for routine exchange of internal/external biliary drainage catheter.  Patient resting in bed with daughter at bedside. She is alert and oriented, calm and pleasant. She is in no distress.  Risks and benefits of biliary drain exchange with moderate sedation discussed with the patient including, but not limited to bleeding, infection which may lead to sepsis or even death and damage to adjacent structures.  This interventional procedure involves the use of X-rays and because of the nature of the planned procedure, it is possible that we will have prolonged use of X-ray fluoroscopy.  Potential radiation risks to you include (but are not limited to) the following: - A slightly elevated risk for cancer  several years later in life. This risk is typically less than 0.5% percent. This risk is low in comparison to the normal incidence of human cancer, which is 33% for women and 50% for men according to the [employer] - Radiation induced injury can include skin redness, resembling a rash, tissue breakdown / ulcers and hair loss (which can be temporary or permanent).  The likelihood of either of these occurring depends on the difficulty of the procedure and whether you are sensitive to radiation due to previous procedures, disease, or genetic conditions.   IF your  procedure requires a prolonged use of radiation, you will be notified and given written instructions for further action.  It is your responsibility to monitor the irradiated area for the 2 weeks following the procedure and to notify your physician if you are concerned that you have suffered a radiation induced injury.    All of the patient's questions were answered, patient is agreeable to proceed.  Consent signed and in chart.   Thank you for this interesting consult.  I greatly enjoyed meeting Diamond Hinton and look forward to participating in their care.  A copy of this report was sent to the requesting provider on this date.  Electronically Signed: Tyson Alias, NP 08/22/2022, 12:20 PM   I spent a total of 20 minutes in face to face in clinical consultation, greater than 50% of which was counseling/coordinating care for routine biliary drain exchange.

## 2022-08-22 ENCOUNTER — Ambulatory Visit
Admission: RE | Admit: 2022-08-22 | Discharge: 2022-08-22 | Disposition: A | Discharge status: Home / Self Care | Payer: Medicare HMO | Source: Ambulatory Visit | Attending: Interventional Radiology | Admitting: Interventional Radiology

## 2022-08-22 ENCOUNTER — Encounter: Payer: Self-pay | Admitting: Radiology

## 2022-08-22 ENCOUNTER — Inpatient Hospital Stay: Payer: Medicare HMO

## 2022-08-22 ENCOUNTER — Other Ambulatory Visit: Payer: Self-pay | Admitting: Interventional Radiology

## 2022-08-22 DIAGNOSIS — K831 Obstruction of bile duct: Secondary | ICD-10-CM

## 2022-08-22 DIAGNOSIS — C50912 Malignant neoplasm of unspecified site of left female breast: Secondary | ICD-10-CM

## 2022-08-22 DIAGNOSIS — Z853 Personal history of malignant neoplasm of breast: Secondary | ICD-10-CM | POA: Diagnosis not present

## 2022-08-22 DIAGNOSIS — C50919 Malignant neoplasm of unspecified site of unspecified female breast: Secondary | ICD-10-CM | POA: Diagnosis not present

## 2022-08-22 DIAGNOSIS — C784 Secondary malignant neoplasm of small intestine: Secondary | ICD-10-CM | POA: Diagnosis not present

## 2022-08-22 DIAGNOSIS — T85590A Other mechanical complication of bile duct prosthesis, initial encounter: Secondary | ICD-10-CM | POA: Diagnosis not present

## 2022-08-22 DIAGNOSIS — K311 Adult hypertrophic pyloric stenosis: Secondary | ICD-10-CM | POA: Diagnosis not present

## 2022-08-22 DIAGNOSIS — Z4803 Encounter for change or removal of drains: Secondary | ICD-10-CM | POA: Diagnosis not present

## 2022-08-22 HISTORY — PX: IR BILIARY DILITATION: IMG628

## 2022-08-22 LAB — BASIC METABOLIC PANEL
Anion gap: 11 (ref 5–15)
BUN: 21 mg/dL (ref 8–23)
CO2: 21 mmol/L — ABNORMAL LOW (ref 22–32)
Calcium: 9.8 mg/dL (ref 8.9–10.3)
Chloride: 104 mmol/L (ref 98–111)
Creatinine, Ser: 1.24 mg/dL — ABNORMAL HIGH (ref 0.44–1.00)
GFR, Estimated: 47 mL/min — ABNORMAL LOW (ref >60)
Glucose, Bld: 125 mg/dL — ABNORMAL HIGH (ref 70–99)
Potassium: 4.2 mmol/L (ref 3.5–5.1)
Sodium: 136 mmol/L (ref 135–145)

## 2022-08-22 LAB — CANCER ANTIGEN 27.29: CA 27.29: 25.9 U/mL (ref 0.0–38.6)

## 2022-08-22 LAB — CBC
HCT: 39.6 % (ref 36.0–46.0)
Hemoglobin: 14 g/dL (ref 12.0–15.0)
MCH: 32.5 pg (ref 26.0–34.0)
MCHC: 35.4 g/dL (ref 30.0–36.0)
MCV: 91.9 fL (ref 80.0–100.0)
Platelets: 349 10*3/uL (ref 150–400)
RBC: 4.31 MIL/uL (ref 3.87–5.11)
RDW: 14.9 % (ref 11.5–15.5)
WBC: 7 10*3/uL (ref 4.0–10.5)
nRBC: 0 % (ref 0.0–0.2)

## 2022-08-22 LAB — PROTIME-INR
INR: 1.1 (ref 0.8–1.2)
Prothrombin Time: 14.1 seconds (ref 11.4–15.2)

## 2022-08-22 MED ORDER — MIDAZOLAM HCL 2 MG/2ML IJ SOLN
INTRAMUSCULAR | Status: AC | PRN
  Administered 2022-08-22 (×3): 1 mg via INTRAVENOUS

## 2022-08-22 MED ORDER — LIDOCAINE HCL 1 % IJ SOLN
INTRAMUSCULAR | Status: AC
  Administered 2022-08-22: 7 mL
  Filled 2022-08-22: qty 20

## 2022-08-22 MED ORDER — FENTANYL CITRATE (PF) 100 MCG/2ML IJ SOLN
INTRAMUSCULAR | Status: AC
  Filled 2022-08-22: qty 2

## 2022-08-22 MED ORDER — SODIUM CHLORIDE 0.9 % IV SOLN: INTRAVENOUS | Status: DC

## 2022-08-22 MED ORDER — FENTANYL CITRATE (PF) 100 MCG/2ML IJ SOLN
INTRAMUSCULAR | Status: AC | PRN
  Administered 2022-08-22: 25 ug via INTRAVENOUS
  Administered 2022-08-22: 50 ug via INTRAVENOUS
  Administered 2022-08-22: 25 ug via INTRAVENOUS

## 2022-08-22 MED ORDER — SODIUM CHLORIDE 0.9 % IV SOLN
2.0000 g | INTRAVENOUS | Status: AC
  Administered 2022-08-22: 2 g via INTRAVENOUS
  Filled 2022-08-22: qty 2

## 2022-08-22 MED ORDER — OXYCODONE HCL 5 MG PO TABS: 5.0000 mg | ORAL_TABLET | Freq: Once | ORAL | Status: DC

## 2022-08-22 MED ORDER — IOHEXOL 300 MG/ML  SOLN
40.0000 mL | Freq: Once | INTRAMUSCULAR | Status: AC | PRN
  Administered 2022-08-22: 40 mL

## 2022-08-22 MED ORDER — SODIUM CHLORIDE 0.9% FLUSH: 5.0000 mL | Freq: Three times a day (TID) | INTRAVENOUS | Status: DC

## 2022-08-22 MED ORDER — MIDAZOLAM HCL 2 MG/2ML IJ SOLN
INTRAMUSCULAR | Status: AC
  Filled 2022-08-22: qty 4

## 2022-08-22 NOTE — Procedures (Signed)
Interventional Radiology Procedure Note  Procedure:  1) Biliary drain check 2) Cholangioscopy 3) Cholangioplasty 4) Biliary drain exchange   Findings: Please refer to procedural dictation for full description. Indwelling stents patent upon examination with cholangioscopy.  Apparent tumor extension proximally, with stenosis but not occlusion of the left intrahepatic ducts.  Balloon cholangioplasty of the indwelling stents to 10 mm.  Replacement of right internal/external biliary drain, 14 Fr, to bag drainage.  Complications: None immediate  Estimated Blood Loss: < 5 mL  Recommendations: Keep to bag drainage until tomorrow, then cap with instruction to place back to bag drainage if developing obstructive symptoms. Will discuss further intervention for management of left sided bile ducts.   Ruthann Cancer, MD

## 2022-08-22 NOTE — Progress Notes (Signed)
Dr. Serafina Royals in pt. Room to discuss procedure and care of biliary drains with pt. And her daughter.

## 2022-08-23 ENCOUNTER — Other Ambulatory Visit: Payer: Self-pay | Admitting: Interventional Radiology

## 2022-08-23 DIAGNOSIS — K831 Obstruction of bile duct: Secondary | ICD-10-CM

## 2022-08-23 DIAGNOSIS — T85590D Other mechanical complication of bile duct prosthesis, subsequent encounter: Secondary | ICD-10-CM

## 2022-08-23 LAB — CANCER ANTIGEN 15-3: CA 15-3: 32.8 U/mL — ABNORMAL HIGH (ref 0.0–25.0)

## 2022-08-29 ENCOUNTER — Ambulatory Visit: Payer: Medicare HMO | Admitting: Radiology

## 2022-08-31 ENCOUNTER — Telehealth: Payer: Self-pay | Admitting: *Deleted

## 2022-08-31 NOTE — Telephone Encounter (Signed)
Received FMLA form for daughter Anderson Malta. Form completed and sent for doctor signature

## 2022-09-01 NOTE — Telephone Encounter (Signed)
Form completed and faxed back to [facility]

## 2022-09-05 ENCOUNTER — Inpatient Hospital Stay: Payer: Medicare HMO

## 2022-09-05 ENCOUNTER — Other Ambulatory Visit (HOSPITAL_COMMUNITY): Payer: Self-pay

## 2022-09-05 DIAGNOSIS — R7989 Other specified abnormal findings of blood chemistry: Secondary | ICD-10-CM

## 2022-09-05 DIAGNOSIS — C50912 Malignant neoplasm of unspecified site of left female breast: Secondary | ICD-10-CM

## 2022-09-05 DIAGNOSIS — C50919 Malignant neoplasm of unspecified site of unspecified female breast: Secondary | ICD-10-CM | POA: Diagnosis not present

## 2022-09-05 DIAGNOSIS — C784 Secondary malignant neoplasm of small intestine: Secondary | ICD-10-CM | POA: Diagnosis not present

## 2022-09-05 LAB — COMPREHENSIVE METABOLIC PANEL
ALT: 30 U/L (ref 0–44)
AST: 21 U/L (ref 15–41)
Albumin: 4.2 g/dL (ref 3.5–5.0)
Alkaline Phosphatase: 72 U/L (ref 38–126)
Anion gap: 9 (ref 5–15)
BUN: 22 mg/dL (ref 8–23)
CO2: 24 mmol/L (ref 22–32)
Calcium: 9.3 mg/dL (ref 8.9–10.3)
Chloride: 102 mmol/L (ref 98–111)
Creatinine, Ser: 1.19 mg/dL — ABNORMAL HIGH (ref 0.44–1.00)
GFR, Estimated: 50 mL/min — ABNORMAL LOW (ref >60)
Glucose, Bld: 131 mg/dL — ABNORMAL HIGH (ref 70–99)
Potassium: 3.8 mmol/L (ref 3.5–5.1)
Sodium: 135 mmol/L (ref 135–145)
Total Bilirubin: 0.4 mg/dL (ref 0.3–1.2)
Total Protein: 7.7 g/dL (ref 6.5–8.1)

## 2022-09-08 ENCOUNTER — Other Ambulatory Visit: Payer: Self-pay | Admitting: Oncology

## 2022-09-08 ENCOUNTER — Other Ambulatory Visit (HOSPITAL_COMMUNITY): Payer: Self-pay

## 2022-09-08 ENCOUNTER — Other Ambulatory Visit: Payer: Self-pay

## 2022-09-08 DIAGNOSIS — C50912 Malignant neoplasm of unspecified site of left female breast: Secondary | ICD-10-CM

## 2022-09-08 MED ORDER — PALBOCICLIB 100 MG PO TABS
100.0000 mg | ORAL_TABLET | Freq: Every day | ORAL | 0 refills | Status: DC
  Filled 2022-09-08: qty 21, 28d supply, fill #0

## 2022-09-08 NOTE — Telephone Encounter (Signed)
Comprehensive metabolic panel Order: [id number] Status: Final result     Visible to patient: Yes (seen)     Next appt: 09/20/2022 at 01:00 PM in Oncology ([facility])     Dx: Abnormal LFTs; Breast cancer metastas...   0 Result Notes          Component Ref Range & Units 3 d ago (09/05/22) 2 wk ago (08/22/22) 2 wk ago (08/21/22) 1 mo ago (08/08/22) 1 mo ago (07/25/22) 1 mo ago (07/18/22) 1 mo ago (07/17/22)  Sodium 135 - 145 mmol/L 135 136 133 Low  134 Low  138 134 Low  138  Potassium 3.5 - 5.1 mmol/L 3.8 4.2 CM 3.6 3.6 3.5 3.6 3.4 Low   Chloride 98 - 111 mmol/L 102 104 104 101 104 103 99  CO2 22 - 32 mmol/L 24 21 Low  20 Low  25 26 23 26   Glucose, Bld 70 - 99 mg/dL 131 High  125 High  CM 198 High  CM 141 High  CM 165 High  CM 107 High  CM 146 High  CM  Comment: Glucose reference range applies only to samples taken after fasting for at least 8 hours.  BUN 8 - 23 mg/dL 22 21 19 22 15 20 16   Creatinine, Ser 0.44 - 1.00 mg/dL 1.19 High  1.24 High  1.14 High  1.14 High  0.90 1.20 High  0.95  Calcium 8.9 - 10.3 mg/dL 9.3 9.8 9.1 9.5 9.1 8.7 Low  9.7  Total Protein 6.5 - 8.1 g/dL 7.7  7.6 7.8 6.9 6.1 Low  6.5  Albumin 3.5 - 5.0 g/dL 4.2  4.3 4.3 3.6 3.1 Low  3.4 Low   AST 15 - 41 U/L 21  18 20 19  155 High  554 High   ALT 0 - 44 U/L 30  24 20  42 365 High  677 High   Alkaline Phosphatase 38 - 126 U/L 72  77 77 127 High  220 High  284 High   Total Bilirubin 0.3 - 1.2 mg/dL 0.4  0.5 0.6 0.4 1.5 High  3.9 High   GFR, Estimated >60 mL/min 50 Low  47 Low  CM 52 Low  CM 52 Low  CM >60 CM 49 Low  CM >60 CM  Comment: (NOTE) Calculated using the CKD-EPI Creatinine Equation (2021)  Anion gap 5 - 15 9 11  CM 9 CM 8 CM 8 CM 8 CM 13 CM  Comment: Performed at [facility], [address] [place], [place] [address]  Resulting Agency [facility] [place] [facility] [place] [facility] [place] [facility] [place] [facility] [place] [facility] [place] [facility]         Specimen Collected: 09/05/22 10:47 Last Resulted: 09/05/22  11:17      CBC Order: [id number] Status: Final result     Visible to patient: Yes (seen)     Next appt: 09/20/2022 at 01:00 PM in Oncology ([facility])     Dx: Breast cancer metastasized to small i...   0 Result Notes          Component Ref Range & Units 2 wk ago (08/22/22) 2 wk ago (08/21/22) 1 mo ago (08/08/22) 1 mo ago (07/17/22) 1 mo ago (07/16/22) 2 mo ago (07/10/22) 3 mo ago (05/16/22)  WBC 4.0 - 10.5 K/uL 7.0 6.5 4.2 8.5 9.8 5.6 7.4  RBC 3.87 - 5.11 MIL/uL 4.31 3.92 4.01 3.67 Low  4.09 3.72 Low  4.13  Hemoglobin 12.0 - 15.0  g/dL 14.0 12.7 13.1 12.2 13.3 12.5 13.2  HCT 36.0 - 46.0 % 39.6 36.3 38.4 34.6 Low  38.3 35.1 Low  37.5  MCV 80.0 - 100.0 fL 91.9 92.6 95.8 94.3 93.6 94.4 90.8  MCH 26.0 - 34.0 pg 32.5 32.4 32.7 33.2 32.5 33.6 32.0  MCHC 30.0 - 36.0 g/dL 35.4 35.0 34.1 35.3 34.7 35.6 35.2  RDW 11.5 - 15.5 % 14.9 14.7 13.5 13.9 13.9 14.3 14.1  Platelets 150 - 400 K/uL 349 284 236 279 350 234 264  nRBC 0.0 - 0.2 % 0.0 0.0 0.0 0.0 CM 0.0 CM 0.0 0.0  Comment: Performed at [facility], [address] [place], [place] [address]  Neutrophils Relative %  53 R 59 R   59 R 54 R  Basophils Absolute  0.1 R 0.0 R   0.1 R 0.1 R  Immature Granulocytes  1 R 1 R   0 R 0 R  Abs Immature Granulocytes  0.03 R, CM 0.04 R, CM   0.02 R, CM 0.02 R, CM  WBC Morphology   DIFF. CONFIRMED BY SMEAR      RBC Morphology   MORPHOLOGY UNREMARKABLE      Smear Review   Normal platelet morphology CM      Neutro Abs  3.5 R 2.5 R   3.3 R 4.0 R  Lymphocytes Relative  38 R 33 R   31 R 37 R  Lymphs Abs  2.5 R 1.4 R   1.7 R 2.7 R  Monocytes Relative  6 R 4 R   9 R 7 R  Monocytes Absolute  0.4 R 0.2 R   0.5 R 0.5 R  Eosinophils Relative  1 R 2 R   0 R 1 R  Eosinophils Absolute  0.0 R 0.1 R   0.0 R 0.0 R  Basophils Relative  1 R 1 R   1 R 1 R  Resulting Agency [place] [facility] [place] [facility] [place] [facility] [place] [facility] [place] [facility] [place] [facility] [place] [facility]         Specimen Collected: 08/22/22  11:25 Last Resulted: 08/22/22 12:33

## 2022-09-11 ENCOUNTER — Other Ambulatory Visit: Payer: Self-pay

## 2022-09-13 ENCOUNTER — Telehealth (HOSPITAL_COMMUNITY): Payer: Self-pay | Admitting: Student

## 2022-09-13 NOTE — Telephone Encounter (Signed)
Patient with biliary drain recently exchanged/upsized August 23, 2022 with Dr. Serafina Royals. Patient also underwent balloon cholangioplasty of the indwelling common bile duct stents. Patient discharged with instructions to keep the bag to drainage x 24 hours and then cap the drain. An order was placed for the patient to follow up with IR in two months. The patient was instructed to notify IR (and place the drain back to bag drainage) if obstructive symptoms develop.   The patient called IR today stating that Monday night she developed fever and chills. She was concerned about the drain and she placed it back to a gravity bag. She currently does not have a fever but has some nausea which she states is normal for her. She has generalized cancer-related pain.   Patient case discussed with Dr. Serafina Royals. Patient ok to keep drain to a gravity bag. She will follow up with IR in early May as planned. She will be set up for drain evaluation with possible intervention with Dr. Serafina Royals with sedation. Marcie Bal, [facility] scheduler aware. Patient is aware that Marcie Bal should be calling her within the next week or two to arrange a date/time.   Patient knows to call IR if she has any questions, concerns or worsening symptoms.   Soyla Dryer, [place] [phone] 09/13/2022, 11:39 AM

## 2022-09-19 ENCOUNTER — Other Ambulatory Visit: Payer: Self-pay

## 2022-09-19 DIAGNOSIS — C50919 Malignant neoplasm of unspecified site of unspecified female breast: Secondary | ICD-10-CM

## 2022-09-20 ENCOUNTER — Inpatient Hospital Stay: Payer: Medicare HMO | Attending: Oncology

## 2022-09-20 ENCOUNTER — Inpatient Hospital Stay (HOSPITAL_BASED_OUTPATIENT_CLINIC_OR_DEPARTMENT_OTHER): Payer: Medicare HMO | Admitting: Oncology

## 2022-09-20 ENCOUNTER — Encounter: Payer: Self-pay | Admitting: Oncology

## 2022-09-20 VITALS — BP 134/85 | HR 85 | Temp 100.5°F | Ht 63.0 in | Wt 139.9 lb

## 2022-09-20 DIAGNOSIS — C784 Secondary malignant neoplasm of small intestine: Secondary | ICD-10-CM | POA: Diagnosis not present

## 2022-09-20 DIAGNOSIS — Z79811 Long term (current) use of aromatase inhibitors: Secondary | ICD-10-CM

## 2022-09-20 DIAGNOSIS — Z79899 Other long term (current) drug therapy: Secondary | ICD-10-CM | POA: Diagnosis not present

## 2022-09-20 DIAGNOSIS — R509 Fever, unspecified: Secondary | ICD-10-CM | POA: Insufficient documentation

## 2022-09-20 DIAGNOSIS — C50919 Malignant neoplasm of unspecified site of unspecified female breast: Secondary | ICD-10-CM | POA: Insufficient documentation

## 2022-09-20 DIAGNOSIS — Z170 Estrogen receptor positive status [ER+]: Secondary | ICD-10-CM | POA: Diagnosis not present

## 2022-09-20 DIAGNOSIS — C50912 Malignant neoplasm of unspecified site of left female breast: Secondary | ICD-10-CM | POA: Diagnosis not present

## 2022-09-20 DIAGNOSIS — R945 Abnormal results of liver function studies: Secondary | ICD-10-CM | POA: Diagnosis not present

## 2022-09-20 LAB — COMPREHENSIVE METABOLIC PANEL
ALT: 22 U/L (ref 0–44)
AST: 27 U/L (ref 15–41)
Albumin: 4.2 g/dL (ref 3.5–5.0)
Alkaline Phosphatase: 81 U/L (ref 38–126)
Anion gap: 7 (ref 5–15)
BUN: 21 mg/dL (ref 8–23)
CO2: 20 mmol/L — ABNORMAL LOW (ref 22–32)
Calcium: 9.1 mg/dL (ref 8.9–10.3)
Chloride: 107 mmol/L (ref 98–111)
Creatinine, Ser: 1.12 mg/dL — ABNORMAL HIGH (ref 0.44–1.00)
GFR, Estimated: 54 mL/min — ABNORMAL LOW (ref >60)
Glucose, Bld: 176 mg/dL — ABNORMAL HIGH (ref 70–99)
Potassium: 3.8 mmol/L (ref 3.5–5.1)
Sodium: 134 mmol/L — ABNORMAL LOW (ref 135–145)
Total Bilirubin: 0.5 mg/dL (ref 0.3–1.2)
Total Protein: 7.7 g/dL (ref 6.5–8.1)

## 2022-09-20 LAB — CBC WITH DIFFERENTIAL/PLATELET
Abs Immature Granulocytes: 0.03 10*3/uL (ref 0.00–0.07)
Basophils Absolute: 0.1 10*3/uL (ref 0.0–0.1)
Basophils Relative: 1 %
Eosinophils Absolute: 0 10*3/uL (ref 0.0–0.5)
Eosinophils Relative: 1 %
HCT: 36.5 % (ref 36.0–46.0)
Hemoglobin: 12.9 g/dL (ref 12.0–15.0)
Immature Granulocytes: 1 %
Lymphocytes Relative: 37 %
Lymphs Abs: 2.3 10*3/uL (ref 0.7–4.0)
MCH: 33.2 pg (ref 26.0–34.0)
MCHC: 35.3 g/dL (ref 30.0–36.0)
MCV: 93.8 fL (ref 80.0–100.0)
Monocytes Absolute: 0.4 10*3/uL (ref 0.1–1.0)
Monocytes Relative: 6 %
Neutro Abs: 3.5 10*3/uL (ref 1.7–7.7)
Neutrophils Relative %: 54 %
Platelets: 252 10*3/uL (ref 150–400)
RBC: 3.89 MIL/uL (ref 3.87–5.11)
RDW: 15.3 % (ref 11.5–15.5)
WBC: 6.4 10*3/uL (ref 4.0–10.5)
nRBC: 0 % (ref 0.0–0.2)

## 2022-09-20 NOTE — Progress Notes (Signed)
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  She was found to have diffuse duodenal wall thickening as well as a palpable mass in the left breast.  Duodenal biopsy as well as left breast biopsy was consistent withER positive greater than 90%, PR weakly +1 to 10% HER2 negative lobular breast cancer.  Patient underwent external biliary drain for the obstructive jaundice and palliative gastrojejunostomy surgery to relieve her gastric outlet obstruction.CT scan otherwise did not show any evidence of distant metastatic disease.   Patient was started on letrozole plus Kisqali in early April 2023.  However patient developed AKI and hyperuricemia requiring hospitalizations.  Idelle JoKisqali was therefore stopped and patient was started on Ibrance 75 mg in May 2023.NGS testing showed no actionable mutations.  PD-L1 CPS score 1.  MSI stable.  Tumor mutational burden not high.    Interval history-patient usually keeps her PTC drain capped but she has been intermittently uncapping it since early April 2024.  About 10 days ago patient had a temperature of 100 F at home but eventually that resolved.  She denies any abdominal pain at this time.  Appetite is fair.  She does notice more hair loss after increasing the dose of Ibrance to 100 mg  daily  ECOG PS- 1 Pain scale- 0 Opioid associated constipation- no  Review of systems- Review of Systems  Constitutional:  Negative for chills, fever, malaise/fatigue and weight loss.  HENT:  Negative for congestion, ear discharge and nosebleeds.   Eyes:  Negative for blurred vision.  Respiratory:  Negative for cough, hemoptysis, sputum production, shortness of breath and wheezing.   Cardiovascular:  Negative for chest pain, palpitations, orthopnea and claudication.  Gastrointestinal:  Negative for abdominal pain, blood in stool, constipation, diarrhea, heartburn, melena, nausea and vomiting.  Genitourinary:  Negative for dysuria, flank pain, frequency, hematuria and urgency.  Musculoskeletal:  Negative for back pain, joint pain and myalgias.  Skin:  Negative for rash.  Neurological:  Negative for dizziness, tingling, focal weakness, seizures, weakness and headaches.  Endo/Heme/Allergies:  Does not bruise/bleed easily.  Psychiatric/Behavioral:  Negative for depression and suicidal ideas. The patient does not have insomnia.       Allergies  Allergen Reactions   Peanuts [Peanut Oil] Shortness Of Breath and Itching     Past Medical History:  Diagnosis Date   Anemia    Anxiety    Breast cancer (HCC)    Gallstones    GERD (gastroesophageal reflux disease)    Hyperlipidemia    Hypertension    Jaundice 08/29/2021   Rash 08/29/2021   Right upper quadrant abdominal pain 08/29/2021   UTI (urinary tract infection) 08/29/2021     Past Surgical History:  Procedure Laterality Date   BILATERAL SALPINGECTOMY  04/03/1997   CHOLECYSTECTOMY N/A 07/07/2015   Procedure: LAPAROSCOPIC CHOLECYSTECTOMY ;  Surgeon: Leafy Roiego F Pabon, MD;  Location: [facility] ORS;  Service: General;  Laterality:  N/A;   ESOPHAGOGASTRODUODENOSCOPY  08/30/2021   Procedure: ESOPHAGOGASTRODUODENOSCOPY (EGD);  Surgeon: Midge Minium, MD;  Location: [facility] ENDOSCOPY;  Service: Endoscopy;;   GALLBLADDER SURGERY  07/07/2015   [facility]    IR BILIARY DILITATION  08/22/2022   IR BILIARY DRAIN PLACEMENT WITH CHOLANGIOGRAM  08/31/2021   IR BILIARY STENT(S) NEW ACCESS WITH DRAIN  02/08/2022   IR CHOLANGIOGRAM EXISTING TUBE  08/17/2022   IR CONVERT BILIARY DRAIN TO INT EXT BILIARY DRAIN  09/02/2021   IR EXCHANGE BILIARY DRAIN  10/10/2021   IR EXCHANGE BILIARY DRAIN  12/05/2021   IR EXCHANGE BILIARY DRAIN  01/19/2022   IR INT EXT BILIARY DRAIN WITH CHOLANGIOGRAM  07/17/2022   IR RADIOLOGIST EVAL & MGMT  03/01/2022   IR RADIOLOGIST EVAL & MGMT  08/17/2022    Social History   Socioeconomic History   Marital status: Married    Spouse name: [name]   Number of children: 4   Years of education: Not on file   Highest education level: Not on file  Occupational History   Not on file  Tobacco Use   Smoking status: Never   Smokeless tobacco: Never  Vaping Use   Vaping Use: Never used  Substance and Sexual Activity   Alcohol use: No   Drug use: No   Sexual activity: Yes  Other Topics Concern   Not on file  Social History Narrative   Lives with spouse at home: [name] has Parkinson's    Social Determinants of Health   Financial Resource Strain: Low Risk  (11/24/2021)   Overall Financial Resource Strain (CARDIA)    Difficulty of Paying Living Expenses: Not hard at all  Food Insecurity: No Food Insecurity (07/20/2022)   Hunger Vital Sign    Worried About Running Out of Food in the Last Year: Never true    Ran Out of Food in the Last Year: Never true  Transportation Needs: No Transportation Needs (07/20/2022)   PRAPARE - Administrator, Civil Service (Medical): No    Lack of Transportation (Non-Medical): No  Physical Activity: Not on file  Stress: No Stress Concern Present (11/24/2021)   [employer] of Occupational Health - Occupational Stress Questionnaire    Feeling of Stress : Only a little  Social Connections: Unknown (11/24/2021)   Social Connection and Isolation Panel [NHANES]    Frequency of Communication with  Friends and Family: Three times a week    Frequency of Social Gatherings with Friends and Family: Three times a week    Attends Religious Services: Patient declined    Active Member of Clubs or Organizations: Patient declined    Attends Banker Meetings: Patient declined    Marital Status: Married  Catering manager Violence: Not At Risk (07/17/2022)   Humiliation, Afraid, Rape, and Kick questionnaire    Fear of Current or Ex-Partner: No    Emotionally Abused: No    Physically Abused: No    Sexually Abused: No    Family History  Problem Relation Age of Onset   Hypertension Mother    CVA Mother    Ulcers Mother    Emphysema Mother    Heart disease Father    Prostate cancer Father    Macular degeneration Father    Hypertension Sister    Diabetes Sister    Cervical polyp Sister    Hypertension Sister    Diabetes Sister    Lymphoma Sister      Current Outpatient Medications:    acetaminophen (TYLENOL)  325 MG tablet, Take 325 mg by mouth every 6 (six) hours as needed (for pain and sometimes takes 2 if pain is worse)., Disp: , Rfl:    baclofen (LIORESAL) 10 MG tablet, Take 10 mg by mouth 3 (three) times daily., Disp: , Rfl:    letrozole (FEMARA) 2.5 MG tablet, Take 1 tablet (2.5 mg total) by mouth daily., Disp: 30 tablet, Rfl: 3   levothyroxine (SYNTHROID) 88 MCG tablet, Take 1 tablet by mouth once daily, Disp: 90 tablet, Rfl: 0   Multiple Vitamins-Minerals (CENTRUM VITAMINTS PO), Take 1 tablet by mouth daily., Disp: , Rfl:    ondansetron (ZOFRAN) 4 MG tablet, Take 1 tablet (4 mg total) by mouth daily as needed for nausea or vomiting., Disp: 30 tablet, Rfl: 1   oxyCODONE (ROXICODONE) 5 MG immediate release tablet, Take 1 tablet (5 mg total) by mouth every 4 (four) hours as needed for severe pain., Disp: 30 tablet, Rfl: 0   palbociclib (IBRANCE) 100 MG tablet, Take 1 tablet (100 mg total) by mouth daily. Take for 21 days on, 7 days off, repeat every 28 days., Disp: 21  tablet, Rfl: 0   pantoprazole (PROTONIX) 40 MG tablet, Take 40 mg by mouth daily., Disp: , Rfl:    prochlorperazine (COMPAZINE) 5 MG tablet, Take 1 tablet (5 mg total) by mouth every 6 (six) hours as needed for refractory nausea / vomiting., Disp: 30 tablet, Rfl: 1   hydrochlorothiazide (HYDRODIURIL) 25 MG tablet, Take 1 tablet (25 mg total) by mouth daily. (Patient not taking: Reported on 07/24/2022), Disp: 90 tablet, Rfl: 1  Physical exam:  Vitals:   09/20/22 1319  BP: 134/85  Pulse: 85  Temp: (!) 100.5 F (38.1 C)  TempSrc: Tympanic  SpO2: 98%  Weight: 139 lb 14.4 oz (63.5 kg)  Height: 5\' 3"  (1.6 m)   Physical Exam Cardiovascular:     Rate and Rhythm: Normal rate and regular rhythm.     Heart sounds: Normal heart sounds.  Pulmonary:     Effort: Pulmonary effort is normal.     Breath sounds: Normal breath sounds.  Abdominal:     General: Bowel sounds are normal.     Palpations: Abdomen is soft.     Comments: Dressing in place over the PTC drain.  Skin around the area of the drain appears normal with no signs of infection.  Skin:    General: Skin is warm and dry.  Neurological:     Mental Status: She is alert and oriented to person, place, and time.         Latest Ref Rng & Units 09/20/2022    1:02 PM  CMP  Glucose 70 - 99 mg/dL 220   BUN 8 - 23 mg/dL 21   Creatinine 2.54 - 1.00 mg/dL 2.70   Sodium 623 - 762 mmol/L 134   Potassium 3.5 - 5.1 mmol/L 3.8   Chloride 98 - 111 mmol/L 107   CO2 22 - 32 mmol/L 20   Calcium 8.9 - 10.3 mg/dL 9.1   Total Protein 6.5 - 8.1 g/dL 7.7   Total Bilirubin 0.3 - 1.2 mg/dL 0.5   Alkaline Phos 38 - 126 U/L 81   AST 15 - 41 U/L 27   ALT 0 - 44 U/L 22       Latest Ref Rng & Units 09/20/2022    1:02 PM  CBC  WBC 4.0 - 10.5 K/uL 6.4   Hemoglobin 12.0 - 15.0 g/dL 83.1   Hematocrit 51.7 -  46.0 % 36.5   Platelets 150 - 400 K/uL 252     No images are attached to the encounter.  IR Biliary Dilitation  Result Date:  08/23/2022 INDICATION: 67 year old female with history of metastatic breast cancer to the duodenum 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 The patient unfortunately presented with recurrent biliary obstruction in February 2024 requiring additional right-sided internal external biliary drain placement. Recent CT imaging suggestive of occlusion of the indwelling stents. EXAM: 1. Cholangiogram via established right biliary access. 2. Percutaneous cholangiooscopy of the indwelling biliary stents. 3. Cholangioplasty. 4. Biliary drain exchange. MEDICATIONS: Rocephin 2 gm IV; The antibiotic was administered within an appropriate time frame prior to the initiation of the procedure. ANESTHESIA/SEDATION: Moderate (conscious) sedation was employed during this procedure. A total of Versed 3 mg and Fentanyl 100 mcg was administered intravenously. Moderate Sedation Time: 55 minutes. The patient's level of consciousness and vital signs were monitored continuously by radiology nursing throughout the procedure under my direct supervision. FLUOROSCOPY TIME:  Two hundred twenty-nine mGy COMPLICATIONS: None immediate. PROCEDURE: Informed written consent was obtained from the patient after a thorough discussion of the procedural risks, benefits and alternatives. All questions were addressed. Maximal Sterile Barrier Technique was utilized including caps, mask, sterile gowns, sterile gloves, sterile drape, hand hygiene and skin antiseptic. A timeout was performed prior to the initiation of the procedure. The right upper quadrant indwelling biliary drain were prepped and draped in standard fashion. Preprocedure scout radiograph with right upper quadrant demonstrated unchanged position of the indwelling right-sided biliary drain and common bile duct stents. Hand injection of contrast through the  indwelling drain demonstrated occlusion of the distal aspect of the drain. Subdermal Local anesthesia was provided at the drain entry site with 1% lidocaine. The external portion of the drain was cut to release the inner pigtail. An Amplatz wire was inserted through the drain and into the distal duodenum. The drain was removed and exchanged for a 12 [place] braided sheath. The sheath was directed into the duodenum. Pull-back sheath cholangiogram was then performed. Cholangiogram demonstrated luminal irregularity throughout the stent without evidence of significant obstruction. There was mild narrowing of the stent in the transition region from the 2nd to 3rd portion of the duodenum. Multiple irregular filling defects were visualized near the hepatic duct confluence, just superior to the indwelling stents. Minimal bilateral intrahepatic ductal dilation was observed. Through the indwelling sheath, the 12 French spyglass cholangioscope was inserted. Normal duodenal fill I were visualized upon the distal aspect of the stent. No evidence of tumor ingrowth. Throughout the indwelling stent, there fragments of bilious and digestive material. No evidence of graft fracture or malposition. Limited visualization near the confluence of the intrahepatic ducts. The cholangio scope was removed. Consideration of extending stent into the biliary tree lead to pursuing further definition of the exact position of the left hepatic ducts. A C2 catheter through the indwelling sheath was used to select the left ducts. Additional left-sided cholangiogram was performed. The confluence of the bile ducts is essentially at the same site as the superior aspect of the indwelling stents. Despite sideholes in the superior aspect of the stent, there is insufficient drainage much less space to extend the indwelling stents. Given debris and possible stenosis of the distal aspect of the indwelling stents, cholangio plasty was performed with an 8 mm x 4  cm Bard balloon with no significant change. Therefore, additional  cholangioplasty was performed with a 10 mm x 8 cm Athletis balloon. Throughout the length of the stents. There was significant improved patency, particularly in the region of the ampulla. Contrast injection demonstrated excellent patency of the stent with antegrade flow into the duodenum. The indwelling 12 French sheath was then exchanged for a new, 14 [place] biliary drain. The radiopaque marker was position in similar location as prior, within the right intrahepatic ducts. The distal portion was coiled in the duodenum. The drain was affixed to the skin with a 0 silk suture. The drain was placed to bag drainage. The patient tolerated the procedure well was transferred to the recovery area in good condition IMPRESSION: 1. Occluded indwelling biliary stent and drain. 2. Percutaneous cholangioscopy reveal mild narrowing in the region of the ampulla and scattered debris, however no evidence of significant stent malfunction. 3. Technically successful balloon cholangioplasty of the indwelling common bile duct stents to 10 mm. 4. Technically successful biliary drain exchange and upsize to 14 [place]. PLAN: Keep to bag drainage for 24 hours then capped. The patient was instructed to notify intervention radiology and placed the drain back to bag drainage if obstructive symptoms developed. Plan to return in 2 months for routine check and exchange of indwelling drain. We discussed and consideration of placement of a left-sided separate biliary drain if symptoms worsen or drain/stents occlude once more. Marliss Coots, MD [facility] Electronically Signed   By: Marliss Coots M.D.   On: 08/23/2022 11:59     Assessment and plan- Patient is a 67 y.o. 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  She is here  for routine follow-up  Temperature in the clinic today is 38.1/100.5.  She has not had any fever at home.  I have asked the patient to keep a tab of her temperature over the next 24 to 48 hours.  If her temperature is consistently over 100 I will have a low threshold to initiate oral Levaquin for her.  In the past patient has been admitted for symptoms of cholangitis and I would like to avoid hospitalization as much as possible.  Her LFTs and kidney functions are normal today and patient's appetite is good and she is keeping up with her oral fluids.  She denies any significant abdominal pain and her exam is otherwise unremarkable.  She will get in touch with interventional radiology if she has any worsening abdominal symptoms.  I will have her come back for repeat labs next week and see covering NP.  I will see her back in 1 month.  She will continue with letrozole and Ibrance at this time.  Will consider lowering the dose back to 75 mg if she is unable to tolerate the present dose of 100 mg.   Visit Diagnosis 1. 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 High risk medication use   3. Use of letrozole (Femara)      Dr. Owens Shark, MD, MPH [facility] [phone] 09/20/2022 4:11 PM

## 2022-09-21 LAB — CANCER ANTIGEN 15-3: CA 15-3: 33.4 U/mL — ABNORMAL HIGH (ref 0.0–25.0)

## 2022-09-22 LAB — CANCER ANTIGEN 27.29: CA 27.29: 25.2 U/mL (ref 0.0–38.6)

## 2022-09-26 ENCOUNTER — Other Ambulatory Visit: Payer: Self-pay | Admitting: Family Medicine

## 2022-09-26 ENCOUNTER — Ambulatory Visit: Payer: Medicare HMO | Admitting: Nurse Practitioner

## 2022-09-26 ENCOUNTER — Other Ambulatory Visit: Payer: Medicare HMO

## 2022-09-26 DIAGNOSIS — E039 Hypothyroidism, unspecified: Secondary | ICD-10-CM

## 2022-09-27 ENCOUNTER — Inpatient Hospital Stay: Payer: Medicare HMO

## 2022-09-27 ENCOUNTER — Other Ambulatory Visit: Payer: Self-pay | Admitting: *Deleted

## 2022-09-27 ENCOUNTER — Inpatient Hospital Stay (HOSPITAL_BASED_OUTPATIENT_CLINIC_OR_DEPARTMENT_OTHER): Payer: Medicare HMO | Admitting: Medical Oncology

## 2022-09-27 ENCOUNTER — Encounter: Payer: Self-pay | Admitting: Medical Oncology

## 2022-09-27 VITALS — BP 125/88 | HR 90 | Temp 98.5°F | Resp 16 | Ht 63.0 in | Wt 134.0 lb

## 2022-09-27 DIAGNOSIS — Z170 Estrogen receptor positive status [ER+]: Secondary | ICD-10-CM | POA: Diagnosis not present

## 2022-09-27 DIAGNOSIS — C50912 Malignant neoplasm of unspecified site of left female breast: Secondary | ICD-10-CM

## 2022-09-27 DIAGNOSIS — R7989 Other specified abnormal findings of blood chemistry: Secondary | ICD-10-CM | POA: Diagnosis not present

## 2022-09-27 DIAGNOSIS — Z8619 Personal history of other infectious and parasitic diseases: Secondary | ICD-10-CM

## 2022-09-27 DIAGNOSIS — N179 Acute kidney failure, unspecified: Secondary | ICD-10-CM

## 2022-09-27 DIAGNOSIS — Z79811 Long term (current) use of aromatase inhibitors: Secondary | ICD-10-CM | POA: Diagnosis not present

## 2022-09-27 DIAGNOSIS — C50919 Malignant neoplasm of unspecified site of unspecified female breast: Secondary | ICD-10-CM | POA: Diagnosis not present

## 2022-09-27 DIAGNOSIS — C784 Secondary malignant neoplasm of small intestine: Secondary | ICD-10-CM | POA: Diagnosis not present

## 2022-09-27 DIAGNOSIS — R945 Abnormal results of liver function studies: Secondary | ICD-10-CM | POA: Diagnosis not present

## 2022-09-27 DIAGNOSIS — R509 Fever, unspecified: Secondary | ICD-10-CM | POA: Diagnosis not present

## 2022-09-27 DIAGNOSIS — Z79899 Other long term (current) drug therapy: Secondary | ICD-10-CM | POA: Diagnosis not present

## 2022-09-27 LAB — CBC WITH DIFFERENTIAL/PLATELET
Abs Immature Granulocytes: 0.03 10*3/uL (ref 0.00–0.07)
Basophils Absolute: 0.1 10*3/uL (ref 0.0–0.1)
Basophils Relative: 1 %
Eosinophils Absolute: 0.1 10*3/uL (ref 0.0–0.5)
Eosinophils Relative: 1 %
HCT: 38 % (ref 36.0–46.0)
Hemoglobin: 13.7 g/dL (ref 12.0–15.0)
Immature Granulocytes: 0 %
Lymphocytes Relative: 25 %
Lymphs Abs: 2.1 10*3/uL (ref 0.7–4.0)
MCH: 33.6 pg (ref 26.0–34.0)
MCHC: 36.1 g/dL — ABNORMAL HIGH (ref 30.0–36.0)
MCV: 93.1 fL (ref 80.0–100.0)
Monocytes Absolute: 0.3 10*3/uL (ref 0.1–1.0)
Monocytes Relative: 4 %
Neutro Abs: 5.8 10*3/uL (ref 1.7–7.7)
Neutrophils Relative %: 69 %
Platelets: 396 10*3/uL (ref 150–400)
RBC: 4.08 MIL/uL (ref 3.87–5.11)
RDW: 15 % (ref 11.5–15.5)
WBC: 8.5 10*3/uL (ref 4.0–10.5)
nRBC: 0 % (ref 0.0–0.2)

## 2022-09-27 LAB — COMPREHENSIVE METABOLIC PANEL
ALT: 28 U/L (ref 0–44)
AST: 24 U/L (ref 15–41)
Albumin: 4.5 g/dL (ref 3.5–5.0)
Alkaline Phosphatase: 74 U/L (ref 38–126)
Anion gap: 7 (ref 5–15)
BUN: 27 mg/dL — ABNORMAL HIGH (ref 8–23)
CO2: 20 mmol/L — ABNORMAL LOW (ref 22–32)
Calcium: 9.3 mg/dL (ref 8.9–10.3)
Chloride: 107 mmol/L (ref 98–111)
Creatinine, Ser: 1.3 mg/dL — ABNORMAL HIGH (ref 0.44–1.00)
GFR, Estimated: 45 mL/min — ABNORMAL LOW (ref >60)
Glucose, Bld: 177 mg/dL — ABNORMAL HIGH (ref 70–99)
Potassium: 3.9 mmol/L (ref 3.5–5.1)
Sodium: 134 mmol/L — ABNORMAL LOW (ref 135–145)
Total Bilirubin: 0.6 mg/dL (ref 0.3–1.2)
Total Protein: 7.8 g/dL (ref 6.5–8.1)

## 2022-09-27 NOTE — Progress Notes (Signed)
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  She was found to have diffuse duodenal wall thickening as well as a palpable mass in the left breast.  Duodenal biopsy as well as left breast biopsy was consistent withER positive greater than 90%, PR weakly +1 to 10% HER2 negative lobular breast cancer.  Patient underwent external biliary drain for the obstructive jaundice and palliative gastrojejunostomy surgery to relieve her gastric outlet obstruction.CT scan otherwise did not show any evidence of distant metastatic disease.   Patient was started on letrozole plus Kisqali in early April 2023.  However patient developed AKI and hyperuricemia requiring hospitalizations.  Idelle Jo was therefore stopped and patient was started on Ibrance 75 mg in May 2023.NGS testing showed no actionable mutations.  PD-L1 CPS score 1.  MSI stable.  Tumor mutational burden not high.    Interval history- At her last appointment on 09/20/2022 she was found to have a elevated temperature of 100.56F. There was some concern that she could be at risk for cholangitis so close symptoms and temperature monitoring was recommended. Today she reports for follow up. She reports that she is feeling well and has had no fevers to report. She does state that her appetite  is down a bit. She had stopped omeprazole to try to get off of some medications but has had some nausea since then. She has restarted the omeprazole and her nausea and appetite have started to improve. She is hopeful that her weight will pick up soon.   She denies vomiting, diarrhea, constipation, abdominal pains.   ECOG PS- 1 Pain scale- 0 Opioid associated constipation- no  Review of systems- Review of Systems  Constitutional:  Negative for chills, fever, malaise/fatigue and weight loss.  HENT:  Negative for congestion, ear discharge and nosebleeds.   Eyes:  Negative for blurred vision.  Respiratory:  Negative for cough, hemoptysis, sputum production, shortness of breath and wheezing.   Cardiovascular:  Negative for chest pain, palpitations, orthopnea and claudication.  Gastrointestinal:  Negative for abdominal pain, blood in stool, constipation, diarrhea, heartburn, melena, nausea and vomiting.  Genitourinary:  Negative for dysuria, flank pain, frequency, hematuria and urgency.  Musculoskeletal:  Negative for back pain, joint pain and myalgias.  Skin:  Negative for rash.  Neurological:  Negative for dizziness, tingling, focal weakness, seizures, weakness and headaches.  Endo/Heme/Allergies:  Does not bruise/bleed easily.  Psychiatric/Behavioral:  Negative for depression and suicidal ideas. The patient does not have insomnia.       Allergies  Allergen Reactions   Peanuts [Peanut Oil] Shortness Of Breath and Itching     Past Medical History:  Diagnosis Date   Anemia    Anxiety    Breast cancer    Gallstones    GERD (gastroesophageal reflux disease)    Hyperlipidemia    Hypertension    Jaundice 08/29/2021   Rash 08/29/2021   Right upper quadrant  abdominal pain 08/29/2021   UTI (urinary tract infection) 08/29/2021     Past Surgical History:  Procedure Laterality Date   BILATERAL SALPINGECTOMY  04/03/1997   CHOLECYSTECTOMY N/A 07/07/2015   Procedure: LAPAROSCOPIC  CHOLECYSTECTOMY ;  Surgeon: Leafy Ro, MD;  Location: [facility] ORS;  Service: General;  Laterality: N/A;   ESOPHAGOGASTRODUODENOSCOPY  08/30/2021   Procedure: ESOPHAGOGASTRODUODENOSCOPY (EGD);  Surgeon: Midge Minium, MD;  Location: [facility] ENDOSCOPY;  Service: Endoscopy;;   GALLBLADDER SURGERY  07/07/2015   [facility]   IR BILIARY DILITATION  08/22/2022   IR BILIARY DRAIN PLACEMENT WITH CHOLANGIOGRAM  08/31/2021   IR BILIARY STENT(S) NEW ACCESS WITH DRAIN  02/08/2022   IR CHOLANGIOGRAM EXISTING TUBE  08/17/2022   IR CONVERT BILIARY DRAIN TO INT EXT BILIARY DRAIN  09/02/2021   IR EXCHANGE BILIARY DRAIN  10/10/2021   IR EXCHANGE BILIARY DRAIN  12/05/2021   IR EXCHANGE BILIARY DRAIN  01/19/2022   IR INT EXT BILIARY DRAIN WITH CHOLANGIOGRAM  07/17/2022   IR RADIOLOGIST EVAL & MGMT  03/01/2022   IR RADIOLOGIST EVAL & MGMT  08/17/2022    Social History   Socioeconomic History   Marital status: Married    Spouse name: [name]   Number of children: 4   Years of education: Not on file   Highest education level: Not on file  Occupational History   Not on file  Tobacco Use   Smoking status: Never   Smokeless tobacco: Never  Vaping Use   Vaping Use: Never used  Substance and Sexual Activity   Alcohol use: No   Drug use: No   Sexual activity: Yes  Other Topics Concern   Not on file  Social History Narrative   Lives with spouse at home: [name] has Parkinson's    Social Determinants of Health   Financial Resource Strain: Low Risk  (11/24/2021)   Overall Financial Resource Strain (CARDIA)    Difficulty of Paying Living Expenses: Not hard at all  Food Insecurity: No Food Insecurity (07/20/2022)   Hunger Vital Sign    Worried About Running Out of Food in the Last Year: Never true    Ran Out of Food in the Last Year: Never true  Transportation Needs: No Transportation Needs (07/20/2022)   PRAPARE - Administrator, Civil Service (Medical): No    Lack of Transportation (Non-Medical): No  Physical  Activity: Not on file  Stress: No Stress Concern Present (11/24/2021)   [employer] of Occupational Health - Occupational Stress Questionnaire    Feeling of Stress : Only a little  Social Connections: Unknown (11/24/2021)   Social Connection and Isolation Panel [NHANES]    Frequency of Communication with Friends and Family: Three times a week    Frequency of Social Gatherings with Friends and Family: Three times a week    Attends Religious Services: Patient declined    Active Member of Clubs or Organizations: Patient declined    Attends Banker Meetings: Patient declined    Marital Status: Married  Catering manager Violence: Not At Risk (07/17/2022)   Humiliation, Afraid, Rape, and Kick questionnaire    Fear of Current or Ex-Partner: No    Emotionally Abused: No    Physically Abused: No    Sexually Abused: No    Family History  Problem Relation Age of Onset   Hypertension Mother    CVA Mother    Ulcers Mother    Emphysema Mother    Heart disease Father  Prostate cancer Father    Macular degeneration Father    Hypertension Sister    Diabetes Sister    Cervical polyp Sister    Hypertension Sister    Diabetes Sister    Lymphoma Sister      Current Outpatient Medications:    acetaminophen (TYLENOL) 325 MG tablet, Take 325 mg by mouth every 6 (six) hours as needed (for pain and sometimes takes 2 if pain is worse)., Disp: , Rfl:    baclofen (LIORESAL) 10 MG tablet, Take 10 mg by mouth 3 (three) times daily., Disp: , Rfl:    letrozole (FEMARA) 2.5 MG tablet, Take 1 tablet (2.5 mg total) by mouth daily., Disp: 30 tablet, Rfl: 3   levothyroxine (SYNTHROID) 88 MCG tablet, Take 1 tablet by mouth once daily, Disp: 90 tablet, Rfl: 0   Multiple Vitamins-Minerals (CENTRUM VITAMINTS PO), Take 1 tablet by mouth daily., Disp: , Rfl:    ondansetron (ZOFRAN) 4 MG tablet, Take 1 tablet (4 mg total) by mouth daily as needed for nausea or vomiting., Disp: 30 tablet, Rfl: 1    oxyCODONE (ROXICODONE) 5 MG immediate release tablet, Take 1 tablet (5 mg total) by mouth every 4 (four) hours as needed for severe pain., Disp: 30 tablet, Rfl: 0   palbociclib (IBRANCE) 100 MG tablet, Take 1 tablet (100 mg total) by mouth daily. Take for 21 days on, 7 days off, repeat every 28 days., Disp: 21 tablet, Rfl: 0   pantoprazole (PROTONIX) 40 MG tablet, Take 40 mg by mouth daily., Disp: , Rfl:    prochlorperazine (COMPAZINE) 5 MG tablet, Take 1 tablet (5 mg total) by mouth every 6 (six) hours as needed for refractory nausea / vomiting., Disp: 30 tablet, Rfl: 1   hydrochlorothiazide (HYDRODIURIL) 25 MG tablet, Take 1 tablet (25 mg total) by mouth daily. (Patient not taking: Reported on 07/24/2022), Disp: 90 tablet, Rfl: 1  Physical exam:  Vitals:   09/27/22 1338  BP: 125/88  Pulse: 90  Resp: 16  Temp: 98.5 F (36.9 C)  TempSrc: Oral  SpO2: 99%  Weight: 134 lb (60.8 kg)  Height: 5\' 3"  (1.6 m)   Physical Exam Cardiovascular:     Rate and Rhythm: Normal rate and regular rhythm.     Heart sounds: Normal heart sounds.  Pulmonary:     Effort: Pulmonary effort is normal.     Breath sounds: Normal breath sounds.  Abdominal:     General: Bowel sounds are normal.     Palpations: Abdomen is soft.  Musculoskeletal:     Cervical back: Neck supple.  Lymphadenopathy:     Cervical: No cervical adenopathy.  Skin:    General: Skin is warm and dry.     Coloration: Skin is not jaundiced.  Neurological:     Mental Status: She is alert and oriented to person, place, and time.         Latest Ref Rng & Units 09/27/2022    1:10 PM  CMP  Glucose 70 - 99 mg/dL 604   BUN 8 - 23 mg/dL 27   Creatinine 5.40 - 1.00 mg/dL 9.81   Sodium 191 - 478 mmol/L 134   Potassium 3.5 - 5.1 mmol/L 3.9   Chloride 98 - 111 mmol/L 107   CO2 22 - 32 mmol/L 20   Calcium 8.9 - 10.3 mg/dL 9.3   Total Protein 6.5 - 8.1 g/dL 7.8   Total Bilirubin 0.3 - 1.2 mg/dL 0.6   Alkaline Phos 38 - 126 U/L  74   AST 15  - 41 U/L 24   ALT 0 - 44 U/L 28       Latest Ref Rng & Units 09/27/2022    1:10 PM  CBC  WBC 4.0 - 10.5 K/uL 8.5   Hemoglobin 12.0 - 15.0 g/dL 40.9   Hematocrit 81.1 - 46.0 % 38.0   Platelets 150 - 400 K/uL 396     No images are attached to the encounter. Assessment and plan- Patient is a 67 y.o. 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  She is here for routine follow-up  Glad to see and hear that she is doing well. Labs reviewed and overall stable and non-concerning. Reviewed her creatinine level which is up slightly but in general stable. Will monitor. She will continue her omeprazole at this time; likely had some rebound GERD and mild gastritis from stopping this medication. Should improve with time as she is now back on it. Will watch her weight trends- encouraged her to continue her supplementation of protein drinks such as ensure, Glucerna, etc.   She will keep her follow up visit which is scheduled from about a month from now with Dr. Guadalupe Robert; sooner if needed  .   Visit Diagnosis 1. 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 Abnormal LFTs   3. AKI (acute kidney injury)   4. History of sepsis      Jannetta Quint [facility] [phone] 09/27/2022 3:22 PM

## 2022-10-04 ENCOUNTER — Other Ambulatory Visit (HOSPITAL_COMMUNITY): Payer: Self-pay

## 2022-10-04 ENCOUNTER — Inpatient Hospital Stay: Payer: Medicare HMO

## 2022-10-04 DIAGNOSIS — Z170 Estrogen receptor positive status [ER+]: Secondary | ICD-10-CM | POA: Diagnosis not present

## 2022-10-04 DIAGNOSIS — R945 Abnormal results of liver function studies: Secondary | ICD-10-CM | POA: Diagnosis not present

## 2022-10-04 DIAGNOSIS — C50919 Malignant neoplasm of unspecified site of unspecified female breast: Secondary | ICD-10-CM | POA: Diagnosis not present

## 2022-10-04 DIAGNOSIS — R509 Fever, unspecified: Secondary | ICD-10-CM | POA: Diagnosis not present

## 2022-10-04 DIAGNOSIS — Z79811 Long term (current) use of aromatase inhibitors: Secondary | ICD-10-CM | POA: Diagnosis not present

## 2022-10-04 DIAGNOSIS — N179 Acute kidney failure, unspecified: Secondary | ICD-10-CM

## 2022-10-04 DIAGNOSIS — C784 Secondary malignant neoplasm of small intestine: Secondary | ICD-10-CM | POA: Diagnosis not present

## 2022-10-04 DIAGNOSIS — Z79899 Other long term (current) drug therapy: Secondary | ICD-10-CM | POA: Diagnosis not present

## 2022-10-04 LAB — BASIC METABOLIC PANEL
Anion gap: 10 (ref 5–15)
BUN: 21 mg/dL (ref 8–23)
CO2: 20 mmol/L — ABNORMAL LOW (ref 22–32)
Calcium: 9 mg/dL (ref 8.9–10.3)
Chloride: 104 mmol/L (ref 98–111)
Creatinine, Ser: 1.08 mg/dL — ABNORMAL HIGH (ref 0.44–1.00)
GFR, Estimated: 56 mL/min — ABNORMAL LOW (ref >60)
Glucose, Bld: 119 mg/dL — ABNORMAL HIGH (ref 70–99)
Potassium: 3.9 mmol/L (ref 3.5–5.1)
Sodium: 134 mmol/L — ABNORMAL LOW (ref 135–145)

## 2022-10-06 ENCOUNTER — Other Ambulatory Visit: Payer: Self-pay | Admitting: Oncology

## 2022-10-06 ENCOUNTER — Other Ambulatory Visit: Payer: Self-pay

## 2022-10-06 ENCOUNTER — Other Ambulatory Visit (HOSPITAL_COMMUNITY): Payer: Self-pay

## 2022-10-06 DIAGNOSIS — C50912 Malignant neoplasm of unspecified site of left female breast: Secondary | ICD-10-CM

## 2022-10-06 MED ORDER — PALBOCICLIB 100 MG PO TABS
100.0000 mg | ORAL_TABLET | Freq: Every day | ORAL | 0 refills | Status: DC
Start: 2022-10-06 — End: 2022-10-31
  Filled 2022-10-06: qty 21, 28d supply, fill #0

## 2022-10-11 ENCOUNTER — Other Ambulatory Visit: Payer: Self-pay | Admitting: Family Medicine

## 2022-10-12 ENCOUNTER — Other Ambulatory Visit: Payer: Self-pay | Admitting: Radiology

## 2022-10-12 NOTE — Progress Notes (Signed)
Patient for IR Biliary Drain Ex and IR Intervention with mod sed on Friday 10/13/2022 with Dr Marliss Coots, MD. I called and spoke with the patient on the phone and gave pre-procedure instructions. Pt was made aware to be here at 12:30p, NPO after MN prior to procedure as well as driver post procedure/recovery/discharge. Pt stated understanding.  Called 10/12/2022

## 2022-10-13 ENCOUNTER — Other Ambulatory Visit: Payer: Self-pay | Admitting: Interventional Radiology

## 2022-10-13 ENCOUNTER — Encounter: Payer: Self-pay | Admitting: Radiology

## 2022-10-13 ENCOUNTER — Other Ambulatory Visit: Payer: Self-pay

## 2022-10-13 ENCOUNTER — Ambulatory Visit
Admission: RE | Admit: 2022-10-13 | Discharge: 2022-10-13 | Disposition: A | Discharge status: Home / Self Care | Payer: Medicare HMO | Source: Ambulatory Visit | Attending: Interventional Radiology | Admitting: Interventional Radiology

## 2022-10-13 VITALS — BP 128/78 | HR 78 | Temp 97.8°F | Resp 19

## 2022-10-13 DIAGNOSIS — F419 Anxiety disorder, unspecified: Secondary | ICD-10-CM | POA: Diagnosis not present

## 2022-10-13 DIAGNOSIS — Z853 Personal history of malignant neoplasm of breast: Secondary | ICD-10-CM | POA: Diagnosis not present

## 2022-10-13 DIAGNOSIS — K831 Obstruction of bile duct: Secondary | ICD-10-CM | POA: Diagnosis not present

## 2022-10-13 DIAGNOSIS — Z434 Encounter for attention to other artificial openings of digestive tract: Secondary | ICD-10-CM | POA: Diagnosis not present

## 2022-10-13 DIAGNOSIS — C784 Secondary malignant neoplasm of small intestine: Secondary | ICD-10-CM | POA: Diagnosis not present

## 2022-10-13 DIAGNOSIS — I10 Essential (primary) hypertension: Secondary | ICD-10-CM | POA: Insufficient documentation

## 2022-10-13 DIAGNOSIS — T85590A Other mechanical complication of bile duct prosthesis, initial encounter: Secondary | ICD-10-CM

## 2022-10-13 DIAGNOSIS — D649 Anemia, unspecified: Secondary | ICD-10-CM | POA: Insufficient documentation

## 2022-10-13 DIAGNOSIS — C50919 Malignant neoplasm of unspecified site of unspecified female breast: Secondary | ICD-10-CM | POA: Diagnosis not present

## 2022-10-13 HISTORY — PX: IR EXCHANGE BILIARY DRAIN: IMG6046

## 2022-10-13 LAB — CBC WITH DIFFERENTIAL/PLATELET
Abs Immature Granulocytes: 0.02 10*3/uL (ref 0.00–0.07)
Basophils Absolute: 0.1 10*3/uL (ref 0.0–0.1)
Basophils Relative: 1 %
Eosinophils Absolute: 0 10*3/uL (ref 0.0–0.5)
Eosinophils Relative: 1 %
HCT: 38.3 % (ref 36.0–46.0)
Hemoglobin: 13.2 g/dL (ref 12.0–15.0)
Immature Granulocytes: 0 %
Lymphocytes Relative: 30 %
Lymphs Abs: 1.9 10*3/uL (ref 0.7–4.0)
MCH: 32.8 pg (ref 26.0–34.0)
MCHC: 34.5 g/dL (ref 30.0–36.0)
MCV: 95.3 fL (ref 80.0–100.0)
Monocytes Absolute: 0.4 10*3/uL (ref 0.1–1.0)
Monocytes Relative: 7 %
Neutro Abs: 3.9 10*3/uL (ref 1.7–7.7)
Neutrophils Relative %: 61 %
Platelets: 248 10*3/uL (ref 150–400)
RBC: 4.02 MIL/uL (ref 3.87–5.11)
RDW: 14.7 % (ref 11.5–15.5)
WBC: 6.4 10*3/uL (ref 4.0–10.5)
nRBC: 0 % (ref 0.0–0.2)

## 2022-10-13 LAB — COMPREHENSIVE METABOLIC PANEL
ALT: 38 U/L (ref 0–44)
AST: 34 U/L (ref 15–41)
Albumin: 4.3 g/dL (ref 3.5–5.0)
Alkaline Phosphatase: 60 U/L (ref 38–126)
Anion gap: 10 (ref 5–15)
BUN: 21 mg/dL (ref 8–23)
CO2: 19 mmol/L — ABNORMAL LOW (ref 22–32)
Calcium: 9.7 mg/dL (ref 8.9–10.3)
Chloride: 109 mmol/L (ref 98–111)
Creatinine, Ser: 1.05 mg/dL — ABNORMAL HIGH (ref 0.44–1.00)
GFR, Estimated: 58 mL/min — ABNORMAL LOW (ref >60)
Glucose, Bld: 116 mg/dL — ABNORMAL HIGH (ref 70–99)
Potassium: 3.4 mmol/L — ABNORMAL LOW (ref 3.5–5.1)
Sodium: 138 mmol/L (ref 135–145)
Total Bilirubin: 0.7 mg/dL (ref 0.3–1.2)
Total Protein: 7.7 g/dL (ref 6.5–8.1)

## 2022-10-13 LAB — PROTIME-INR
INR: 1.2 (ref 0.8–1.2)
Prothrombin Time: 15.3 seconds — ABNORMAL HIGH (ref 11.4–15.2)

## 2022-10-13 MED ORDER — FENTANYL CITRATE (PF) 100 MCG/2ML IJ SOLN
INTRAMUSCULAR | Status: AC
  Filled 2022-10-13: qty 2

## 2022-10-13 MED ORDER — SODIUM CHLORIDE 0.9 % IV SOLN: INTRAVENOUS | Status: DC

## 2022-10-13 MED ORDER — MIDAZOLAM HCL 2 MG/2ML IJ SOLN
INTRAMUSCULAR | Status: AC | PRN
  Administered 2022-10-13: 1 mg via INTRAVENOUS

## 2022-10-13 MED ORDER — SODIUM CHLORIDE 0.9 % IV SOLN
2.0000 g | Freq: Once | INTRAVENOUS | Status: DC
  Filled 2022-10-13: qty 20

## 2022-10-13 MED ORDER — SODIUM CHLORIDE 0.9 % IV SOLN
INTRAVENOUS | Status: AC | PRN
  Administered 2022-10-13: 2 g via INTRAVENOUS

## 2022-10-13 MED ORDER — SODIUM CHLORIDE 0.9% FLUSH: 5.0000 mL | Freq: Three times a day (TID) | INTRAVENOUS | Status: DC

## 2022-10-13 MED ORDER — LIDOCAINE HCL 1 % IJ SOLN
5.0000 mL | Freq: Once | INTRAMUSCULAR | Status: AC
  Administered 2022-10-13: 5 mL via INTRADERMAL

## 2022-10-13 MED ORDER — FENTANYL CITRATE (PF) 100 MCG/2ML IJ SOLN
INTRAMUSCULAR | Status: AC | PRN
  Administered 2022-10-13 (×2): 50 ug via INTRAVENOUS

## 2022-10-13 MED ORDER — IOHEXOL 300 MG/ML  SOLN
10.0000 mL | Freq: Once | INTRAMUSCULAR | Status: AC | PRN
  Administered 2022-10-13: 10 mL

## 2022-10-13 MED ORDER — MIDAZOLAM HCL 2 MG/2ML IJ SOLN
INTRAMUSCULAR | Status: AC
  Filled 2022-10-13: qty 2

## 2022-10-13 MED ORDER — LIDOCAINE HCL 1 % IJ SOLN
INTRAMUSCULAR | Status: AC
  Filled 2022-10-13: qty 20

## 2022-10-13 MED ORDER — MIDAZOLAM HCL 5 MG/5ML IJ SOLN
INTRAMUSCULAR | Status: AC | PRN
  Administered 2022-10-13: 1 mg via INTRAVENOUS

## 2022-10-13 NOTE — H&P (Signed)
Chief Complaint: Patient was seen in consultation today for biliary obstruction  Referring Physician(s): Suttle,Dylan J  Supervising Physician: Marliss Coots  Patient Status: [facility] - Out-pt  History of Present Illness: Diamond Hinton is a 67 y.o. female with PMH significant for anemia, anxiety, breast cancer, gallstones, and hypertension being seen today for image-guided biliary drain exchange under moderate sedation with possible new image-guided biliary drain placement. She underwent placement of right-sided internal/external biliary drainage catheter 07/17/22. Upon discharge from [facility], drain was capped and pt was provided with gravity bag. At home, she developed low-grade fever and malaise. She connected to gravity bag and then recapped a few days later. She presented to IR 08/17/22 for evaluation of drain that revealed drain and biliary stent occlusion. She subsequently underwent image-guided cholangioplasty with biliary drain exchange. Patient presents today for routine exchange with possible new placement if necessary.  Past Medical History:  Diagnosis Date   Anemia    Anxiety    Breast cancer (HCC)    Gallstones    GERD (gastroesophageal reflux disease)    Hyperlipidemia    Hypertension    Jaundice 08/29/2021   Rash 08/29/2021   Right upper quadrant abdominal pain 08/29/2021   UTI (urinary tract infection) 08/29/2021    Past Surgical History:  Procedure Laterality Date   BILATERAL SALPINGECTOMY  04/03/1997   CHOLECYSTECTOMY N/A 07/07/2015   Procedure: LAPAROSCOPIC CHOLECYSTECTOMY ;  Surgeon: Leafy Ro, MD;  Location: [facility] ORS;  Service: General;  Laterality: N/A;   ESOPHAGOGASTRODUODENOSCOPY  08/30/2021   Procedure: ESOPHAGOGASTRODUODENOSCOPY (EGD);  Surgeon: Midge Minium, MD;  Location: [facility] ENDOSCOPY;  Service: Endoscopy;;   GALLBLADDER SURGERY  07/07/2015   [facility]   IR BILIARY DILITATION  08/22/2022   IR BILIARY DRAIN PLACEMENT WITH CHOLANGIOGRAM  08/31/2021   IR BILIARY  STENT(S) NEW ACCESS WITH DRAIN  02/08/2022   IR CHOLANGIOGRAM EXISTING TUBE  08/17/2022   IR CONVERT BILIARY DRAIN TO INT EXT BILIARY DRAIN  09/02/2021   IR EXCHANGE BILIARY DRAIN  10/10/2021   IR EXCHANGE BILIARY DRAIN  12/05/2021   IR EXCHANGE BILIARY DRAIN  01/19/2022   IR INT EXT BILIARY DRAIN WITH CHOLANGIOGRAM  07/17/2022   IR RADIOLOGIST EVAL & MGMT  03/01/2022   IR RADIOLOGIST EVAL & MGMT  08/17/2022    Allergies: Peanuts [peanut oil]  Medications: Prior to Admission medications   Medication Sig Start Date End Date Taking? Authorizing Provider  acetaminophen (TYLENOL) 325 MG tablet Take 325 mg by mouth every 6 (six) hours as needed (for pain and sometimes takes 2 if pain is worse).    [provider]  baclofen (LIORESAL) 10 MG tablet Take 10 mg by mouth 3 (three) times daily.    [provider]  hydrochlorothiazide (HYDRODIURIL) 25 MG tablet Take 1 tablet by mouth once daily 10/11/22   Erasmo Downer, MD  letrozole [facility]) 2.5 MG tablet Take 1 tablet (2.5 mg total) by mouth daily. 06/16/22   Creig Hines, MD  levothyroxine (SYNTHROID) 88 MCG tablet Take 1 tablet by mouth once daily 09/26/22   Erasmo Downer, MD  Multiple Vitamins-Minerals (CENTRUM VITAMINTS PO) Take 1 tablet by mouth daily.    [provider]  ondansetron (ZOFRAN) 4 MG tablet Take 1 tablet (4 mg total) by mouth daily as needed for nausea or vomiting. 02/14/22 02/14/23  Creig Hines, MD  oxyCODONE (ROXICODONE) 5 MG immediate release tablet Take 1 tablet (5 mg total) by mouth every 4 (four) hours as needed for  severe pain. 07/24/22   Bacigalupo, Marzella Schlein, MD  palbociclib (IBRANCE) 100 MG tablet Take 1 tablet (100 mg total) by mouth daily. Take for 21 days on, 7 days off, repeat every 28 days. 10/06/22   Creig Hines, MD  pantoprazole (PROTONIX) 40 MG tablet Take 40 mg by mouth daily.    [provider]  prochlorperazine (COMPAZINE) 5 MG tablet Take 1 tablet (5 mg total) by mouth every 6  (six) hours as needed for refractory nausea / vomiting. 02/14/22   Creig Hines, MD     Family History  Problem Relation Age of Onset   Hypertension Mother    CVA Mother    Ulcers Mother    Emphysema Mother    Heart disease Father    Prostate cancer Father    Macular degeneration Father    Hypertension Sister    Diabetes Sister    Cervical polyp Sister    Hypertension Sister    Diabetes Sister    Lymphoma Sister     Social History   Socioeconomic History   Marital status: Married    Spouse name: [name]   Number of children: 4   Years of education: Not on file   Highest education level: Not on file  Occupational History   Not on file  Tobacco Use   Smoking status: Never   Smokeless tobacco: Never  Vaping Use   Vaping Use: Never used  Substance and Sexual Activity   Alcohol use: No   Drug use: No   Sexual activity: Yes  Other Topics Concern   Not on file  Social History Narrative   Lives with spouse at home: [name] has Parkinson's    Social Determinants of Health   Financial Resource Strain: Low Risk  (11/24/2021)   Overall Financial Resource Strain (CARDIA)    Difficulty of Paying Living Expenses: Not hard at all  Food Insecurity: No Food Insecurity (07/20/2022)   Hunger Vital Sign    Worried About Running Out of Food in the Last Year: Never true    Ran Out of Food in the Last Year: Never true  Transportation Needs: No Transportation Needs (07/20/2022)   PRAPARE - Administrator, Civil Service (Medical): No    Lack of Transportation (Non-Medical): No  Physical Activity: Not on file  Stress: No Stress Concern Present (11/24/2021)   [employer] of Occupational Health - Occupational Stress Questionnaire    Feeling of Stress : Only a little  Social Connections: Unknown (11/24/2021)   Social Connection and Isolation Panel [NHANES]    Frequency of Communication with Friends and Family: Three times a week    Frequency of Social Gatherings with Friends  and Family: Three times a week    Attends Religious Services: Patient declined    Active Member of Clubs or Organizations: Patient declined    Attends Banker Meetings: Patient declined    Marital Status: Married    Code Status: Full Code  Review of Systems: A 12 point ROS discussed and pertinent positives are indicated in the HPI above.  All other systems are negative.  Review of Systems  Constitutional:  Negative for chills and fever.  Respiratory:  Negative for chest tightness and shortness of breath.   Cardiovascular:  Negative for chest pain and leg swelling.  Gastrointestinal:  Negative for abdominal pain, diarrhea, nausea and vomiting.  Neurological:  Negative for dizziness and headaches.  Psychiatric/Behavioral:  Negative for confusion.     Vital  Signs: BP 139/84   Temp 97.8 F (36.6 C) (Oral)   Resp 16   SpO2 97%  HR 89    Physical Exam Vitals reviewed.  Constitutional:      General: She is not in acute distress.    Appearance: She is not ill-appearing.  Eyes:     Pupils: Pupils are equal, round, and reactive to light.  Cardiovascular:     Rate and Rhythm: Normal rate and regular rhythm.     Pulses: Normal pulses.     Heart sounds: Normal heart sounds.  Pulmonary:     Effort: Pulmonary effort is normal.     Breath sounds: Normal breath sounds.  Abdominal:     General: Bowel sounds are normal.     Palpations: Abdomen is soft.     Tenderness: There is no abdominal tenderness.     Comments: RUQ biliary drain in place with ~100 cc of bilious output in drain. Drain with suture intact. Dressed appropriately, no bleeding, drainage, erythema, or induration noted at drain site  Musculoskeletal:     Right lower leg: No edema.     Left lower leg: No edema.  Skin:    General: Skin is warm and dry.  Neurological:     Mental Status: She is alert and oriented to person, place, and time.  Psychiatric:        Mood and Affect: Mood normal.        Behavior:  Behavior normal.        Thought Content: Thought content normal.        Judgment: Judgment normal.     Imaging: No results found.  Labs:  CBC: Recent Labs    08/21/22 1055 08/22/22 1125 09/20/22 1302 09/27/22 1310  WBC 6.5 7.0 6.4 8.5  HGB 12.7 14.0 12.9 13.7  HCT 36.3 39.6 36.5 38.0  PLT 284 349 252 396    COAGS: Recent Labs    02/08/22 0918 08/22/22 1125  INR 1.3* 1.1    BMP: Recent Labs    09/05/22 1047 09/20/22 1302 09/27/22 1310 10/04/22 1417  NA 135 134* 134* 134*  K 3.8 3.8 3.9 3.9  CL 102 107 107 104  CO2 24 20* 20* 20*  GLUCOSE 131* 176* 177* 119*  BUN 22 21 27* 21  CALCIUM 9.3 9.1 9.3 9.0  CREATININE 1.19* 1.12* 1.30* 1.08*  GFRNONAA 50* 54* 45* 56*    LIVER FUNCTION TESTS: Recent Labs    08/21/22 1055 09/05/22 1047 09/20/22 1302 09/27/22 1310  BILITOT 0.5 0.4 0.5 0.6  AST 18 21 27 24   ALT 24 30 22 28   ALKPHOS 77 72 81 74  PROT 7.6 7.7 7.7 7.8  ALBUMIN 4.3 4.2 4.2 4.5    TUMOR MARKERS: No results for input(s): "AFPTM", "CEA", "CA199", "CHROMGRNA" in the last 8760 hours.  Assessment and Plan:  Diamond Hinton is a 67 yo female being seen today in relation to a biliary obstruction with existing biliary drain in place. Patient presents for routine exchange of her drain today, but due to known difficulty with the patient's anatomy, a new image-guided biliary drain placement may be necessary. This possibility was discussed with the patient and she is in agreement with plan. She presents today in her usual state of health and is NPO. Case has been reviewed with Dr Elby Showers and is scheduled to proceed on 10/13/22.  Risks and benefits of image-guided biliary drain exchange with possible new drain placement discussed with the patient including, but not  limited to bleeding, infection which may lead to sepsis or even death and damage to adjacent structures.  This interventional procedure involves the use of X-rays and because of the nature of the  planned procedure, it is possible that we will have prolonged use of X-ray fluoroscopy.  Potential radiation risks to you include (but are not limited to) the following: - A slightly elevated risk for cancer  several years later in life. This risk is typically less than 0.5% percent. This risk is low in comparison to the normal incidence of human cancer, which is 33% for women and 50% for men according to the American Cancer Society. - Radiation induced injury can include skin redness, resembling a rash, tissue breakdown / ulcers and hair loss (which can be temporary or permanent).   The likelihood of either of these occurring depends on the difficulty of the procedure and whether you are sensitive to radiation due to previous procedures, disease, or genetic conditions.   IF your procedure requires a prolonged use of radiation, you will be notified and given written instructions for further action.  It is your responsibility to monitor the irradiated area for the 2 weeks following the procedure and to notify your physician if you are concerned that you have suffered a radiation induced injury.    All of the patient's questions were answered, patient is agreeable to proceed.  Consent signed and in chart.    Thank you for this interesting consult.  I greatly enjoyed meeting Diamond Hinton and look forward to participating in their care.  A copy of this report was sent to the requesting provider on this date.  Electronically Signed: Kennieth Francois, PA-C 10/13/2022, 1:32 PM   I spent a total of  25 Minutes in face to face in clinical consultation, greater than 50% of which was counseling/coordinating care for biliary obstruction.

## 2022-10-13 NOTE — Procedures (Signed)
Interventional Radiology Procedure Note  Procedure: Biliary drain exchange  Findings: Please refer to procedural dictation for full description. 14 Fr exchange.  Complications: None immediate  Estimated Blood Loss: < 5 mL  Recommendations: Keep to bag drainage. Pt able to cap intermittently at home as tolerated.  Follow up in 6 weeks for routine check and exchange.   Marliss Coots, MD

## 2022-10-20 ENCOUNTER — Ambulatory Visit (INDEPENDENT_AMBULATORY_CARE_PROVIDER_SITE_OTHER): Payer: Medicare HMO | Admitting: Family Medicine

## 2022-10-20 ENCOUNTER — Encounter: Payer: Self-pay | Admitting: Family Medicine

## 2022-10-20 VITALS — BP 113/77 | HR 81 | Temp 97.8°F | Resp 12 | Ht 63.0 in | Wt 133.0 lb

## 2022-10-20 DIAGNOSIS — E1159 Type 2 diabetes mellitus with other circulatory complications: Secondary | ICD-10-CM

## 2022-10-20 DIAGNOSIS — I152 Hypertension secondary to endocrine disorders: Secondary | ICD-10-CM | POA: Diagnosis not present

## 2022-10-20 DIAGNOSIS — E039 Hypothyroidism, unspecified: Secondary | ICD-10-CM

## 2022-10-20 DIAGNOSIS — E785 Hyperlipidemia, unspecified: Secondary | ICD-10-CM

## 2022-10-20 DIAGNOSIS — E1169 Type 2 diabetes mellitus with other specified complication: Secondary | ICD-10-CM | POA: Diagnosis not present

## 2022-10-20 LAB — POCT GLYCOSYLATED HEMOGLOBIN (HGB A1C): Hemoglobin A1C: 6.3 % — AB (ref 4.0–5.6)

## 2022-10-20 NOTE — Assessment & Plan Note (Signed)
Well controlled with A1c 6.3 Continue diet control UTD on vaccines, eye exam (ROI sent), foot exam Discussed diet and exercise F/u in 6 months

## 2022-10-20 NOTE — Progress Notes (Signed)
I,Sulibeya S Dimas,acting as a scribe for Shirlee Latch, MD.,have documented all relevant documentation on the behalf of Shirlee Latch, MD,as directed by  Shirlee Latch, MD while in the presence of Shirlee Latch, MD.     Established patient visit   Patient: Diamond Hinton   DOB: [birth year]   67 y.o. Female  MRN: [id number] Visit Date: 10/20/2022  Today's healthcare provider: Shirlee Latch, MD   Chief Complaint  Patient presents with   Diabetes   Hypertension   Hyperlipidemia   Subjective    HPI  Diabetes Mellitus Type II, follow-up  Lab Results  Component Value Date   HGBA1C 6.6 (H) 04/20/2022   HGBA1C 6.5 (A) 10/18/2021   HGBA1C 6.9 (H) 09/14/2021   Last seen for diabetes 6 months ago.  Management since then includes continuing the same treatment. She reports excellent compliance with treatment. She is not having side effects.   Home blood sugar records:  not being checked  Episodes of hypoglycemia? No    Current insulin regiment: none Most Recent Eye Exam: 05/2022 Dr. Alvester Morin  --------------------------------------------------------------------------------------------------- Hypertension, follow-up  BP Readings from Last 3 Encounters:  10/20/22 113/77  10/13/22 128/78  09/27/22 125/88   Wt Readings from Last 3 Encounters:  10/20/22 133 lb (60.3 kg)  09/27/22 134 lb (60.8 kg)  09/20/22 139 lb 14.4 oz (63.5 kg)     She was last seen for hypertension 6 months ago.  BP at that visit was 138/82. Management since that visit includes no changes. She reports  not taking HCTZ  compliance with treatment.   Outside blood pressures are stable. --------------------------------------------------------------------------------------------------- Lipid/Cholesterol, follow-up  Last Lipid Panel: Lab Results  Component Value Date   CHOL 243 (H) 04/20/2022   LDLCALC 145 (H) 04/20/2022   HDL 47 04/20/2022   TRIG 278 (H) 04/20/2022    She was  last seen for this 6 months ago.  Management since that visit includes no changes.  She reports excellent compliance with treatment. She is not having side effects.   Hesitant to take statin due to other med side effects  Last metabolic panel Lab Results  Component Value Date   GLUCOSE 116 (H) 10/13/2022   NA 138 10/13/2022   K 3.4 (L) 10/13/2022   BUN 21 10/13/2022   CREATININE 1.05 (H) 10/13/2022   EGFR 64 10/18/2021   GFRNONAA 58 (L) 10/13/2022   CALCIUM 9.7 10/13/2022   AST 34 10/13/2022   ALT 38 10/13/2022   The 10-year ASCVD risk score (Arnett DK, et al., 2019) is: 12.8%  ---------------------------------------------------------------------------------------------------   Medications: Outpatient Medications Prior to Visit  Medication Sig   acetaminophen (TYLENOL) 325 MG tablet Take 325 mg by mouth every 6 (six) hours as needed (for pain and sometimes takes 2 if pain is worse).   baclofen (LIORESAL) 10 MG tablet Take 10 mg by mouth 3 (three) times daily as needed.   letrozole (FEMARA) 2.5 MG tablet Take 1 tablet (2.5 mg total) by mouth daily.   levothyroxine (SYNTHROID) 88 MCG tablet Take 1 tablet by mouth once daily   Multiple Vitamins-Minerals (CENTRUM VITAMINTS PO) Take 1 tablet by mouth daily.   ondansetron (ZOFRAN) 4 MG tablet Take 1 tablet (4 mg total) by mouth daily as needed for nausea or vomiting.   oxyCODONE (ROXICODONE) 5 MG immediate release tablet Take 1 tablet (5 mg total) by mouth every 4 (four) hours as needed for severe pain.   palbociclib (IBRANCE) 100 MG tablet Take 1 tablet (100 mg total)  by mouth daily. Take for 21 days on, 7 days off, repeat every 28 days.   pantoprazole (PROTONIX) 40 MG tablet Take 40 mg by mouth daily.   prochlorperazine (COMPAZINE) 5 MG tablet Take 1 tablet (5 mg total) by mouth every 6 (six) hours as needed for refractory nausea / vomiting.   traZODone (DESYREL) 50 MG tablet Take 50 mg by mouth at bedtime.   [DISCONTINUED]  hydrochlorothiazide (HYDRODIURIL) 25 MG tablet Take 1 tablet by mouth once daily (Patient not taking: Reported on 10/20/2022)   No facility-administered medications prior to visit.    Review of Systems  Constitutional:  Negative for appetite change, fatigue and unexpected weight change.  Eyes:  Negative for visual disturbance.  Respiratory:  Negative for cough, chest tightness and shortness of breath.   Cardiovascular:  Negative for chest pain and leg swelling.  Gastrointestinal:  Negative for abdominal pain, nausea and vomiting.  Neurological:  Negative for dizziness, light-headedness and headaches.       Objective    BP 113/77 (BP Location: Left Arm, Patient Position: Sitting, Cuff Size: Normal)   Pulse 81   Temp 97.8 F (36.6 C) (Temporal)   Resp 12   Ht 5\' 3"  (1.6 m)   Wt 133 lb (60.3 kg)   BMI 23.56 kg/m    Physical Exam Vitals reviewed.  Constitutional:      General: She is not in acute distress.    Appearance: Normal appearance. She is well-developed. She is not diaphoretic.  HENT:     Head: Normocephalic and atraumatic.  Eyes:     General: No scleral icterus.    Conjunctiva/sclera: Conjunctivae normal.  Neck:     Thyroid: No thyromegaly.  Cardiovascular:     Rate and Rhythm: Normal rate and regular rhythm.     Heart sounds: Normal heart sounds. No murmur heard. Pulmonary:     Effort: Pulmonary effort is normal. No respiratory distress.     Breath sounds: Normal breath sounds. No wheezing, rhonchi or rales.  Musculoskeletal:     Cervical back: Neck supple.     Right lower leg: No edema.     Left lower leg: No edema.  Lymphadenopathy:     Cervical: No cervical adenopathy.  Skin:    General: Skin is warm and dry.  Neurological:     Mental Status: She is alert and oriented to person, place, and time. Mental status is at baseline.  Psychiatric:        Mood and Affect: Mood normal.        Behavior: Behavior normal.       No results found for any visits  on 10/20/22.  Assessment & Plan     Problem List Items Addressed This Visit       Cardiovascular and Mediastinum   Hypertension associated with diabetes (HCC)    Ok to continue to hold HCTZ Continue to monitor BPs and resume if sBP >140 Well controlled Reviewed recent metabolic panel        Endocrine   Hyperlipidemia associated with type 2 diabetes mellitus (HCC)    Reviewed last lipid panel Not currently on a statin - hesitant to take given other medication SEs Recheck FLP and CMP Discussed diet and exercise       Relevant Orders   Lipid panel   Adult hypothyroidism    Previously well controlled Continue Synthroid at current dose  Recheck TSH and adjust Synthroid as indicated        Relevant Orders  TSH   Diabetes mellitus (HCC) - Primary    Well controlled with A1c 6.3 Continue diet control UTD on vaccines, eye exam (ROI sent), foot exam Discussed diet and exercise F/u in 6 months       Relevant Orders   POCT glycosylated hemoglobin (Hb A1C)   TSH   Lipid panel     Return in about 6 months (around 04/22/2023) for CPE, AWV.      I, Shirlee Latch, MD, have reviewed all documentation for this visit. The documentation on 10/20/22 for the exam, diagnosis, procedures, and orders are all accurate and complete.   Allard Lightsey, Marzella Schlein, MD, MPH [facility]

## 2022-10-20 NOTE — Assessment & Plan Note (Signed)
Reviewed last lipid panel Not currently on a statin - hesitant to take given other medication SEs Recheck FLP and CMP Discussed diet and exercise

## 2022-10-20 NOTE — Assessment & Plan Note (Signed)
Ok to continue to hold HCTZ Continue to monitor BPs and resume if sBP >140 Well controlled Reviewed recent metabolic panel

## 2022-10-20 NOTE — Assessment & Plan Note (Signed)
Previously well controlled Continue Synthroid at current dose  Recheck TSH and adjust Synthroid as indicated   

## 2022-10-24 ENCOUNTER — Inpatient Hospital Stay (HOSPITAL_BASED_OUTPATIENT_CLINIC_OR_DEPARTMENT_OTHER): Payer: Medicare HMO | Admitting: Oncology

## 2022-10-24 ENCOUNTER — Encounter: Payer: Self-pay | Admitting: Oncology

## 2022-10-24 ENCOUNTER — Inpatient Hospital Stay: Payer: Medicare HMO | Attending: Oncology

## 2022-10-24 ENCOUNTER — Other Ambulatory Visit: Payer: Self-pay | Admitting: *Deleted

## 2022-10-24 VITALS — BP 108/71 | HR 73 | Temp 98.1°F | Resp 18 | Ht 63.0 in | Wt 135.0 lb

## 2022-10-24 DIAGNOSIS — C50912 Malignant neoplasm of unspecified site of left female breast: Secondary | ICD-10-CM

## 2022-10-24 DIAGNOSIS — Z79899 Other long term (current) drug therapy: Secondary | ICD-10-CM

## 2022-10-24 DIAGNOSIS — Z79620 Long term (current) use of immunosuppressive biologic: Secondary | ICD-10-CM | POA: Insufficient documentation

## 2022-10-24 DIAGNOSIS — R945 Abnormal results of liver function studies: Secondary | ICD-10-CM | POA: Diagnosis not present

## 2022-10-24 DIAGNOSIS — C50919 Malignant neoplasm of unspecified site of unspecified female breast: Secondary | ICD-10-CM | POA: Insufficient documentation

## 2022-10-24 DIAGNOSIS — R7989 Other specified abnormal findings of blood chemistry: Secondary | ICD-10-CM

## 2022-10-24 DIAGNOSIS — C784 Secondary malignant neoplasm of small intestine: Secondary | ICD-10-CM | POA: Insufficient documentation

## 2022-10-24 DIAGNOSIS — K831 Obstruction of bile duct: Secondary | ICD-10-CM | POA: Diagnosis not present

## 2022-10-24 DIAGNOSIS — Z79811 Long term (current) use of aromatase inhibitors: Secondary | ICD-10-CM | POA: Diagnosis not present

## 2022-10-24 DIAGNOSIS — Z170 Estrogen receptor positive status [ER+]: Secondary | ICD-10-CM | POA: Diagnosis not present

## 2022-10-24 DIAGNOSIS — E46 Unspecified protein-calorie malnutrition: Secondary | ICD-10-CM

## 2022-10-24 LAB — COMPREHENSIVE METABOLIC PANEL
ALT: 58 U/L — ABNORMAL HIGH (ref 0–44)
AST: 38 U/L (ref 15–41)
Albumin: 4.3 g/dL (ref 3.5–5.0)
Alkaline Phosphatase: 92 U/L (ref 38–126)
Anion gap: 7 (ref 5–15)
BUN: 20 mg/dL (ref 8–23)
CO2: 24 mmol/L (ref 22–32)
Calcium: 9.3 mg/dL (ref 8.9–10.3)
Chloride: 103 mmol/L (ref 98–111)
Creatinine, Ser: 1.24 mg/dL — ABNORMAL HIGH (ref 0.44–1.00)
GFR, Estimated: 47 mL/min — ABNORMAL LOW (ref >60)
Glucose, Bld: 160 mg/dL — ABNORMAL HIGH (ref 70–99)
Potassium: 4.1 mmol/L (ref 3.5–5.1)
Sodium: 134 mmol/L — ABNORMAL LOW (ref 135–145)
Total Bilirubin: 0.3 mg/dL (ref 0.3–1.2)
Total Protein: 7.9 g/dL (ref 6.5–8.1)

## 2022-10-24 LAB — CBC WITH DIFFERENTIAL/PLATELET
Abs Immature Granulocytes: 0.02 10*3/uL (ref 0.00–0.07)
Basophils Absolute: 0.1 10*3/uL (ref 0.0–0.1)
Basophils Relative: 1 %
Eosinophils Absolute: 0.1 10*3/uL (ref 0.0–0.5)
Eosinophils Relative: 1 %
HCT: 39.8 % (ref 36.0–46.0)
Hemoglobin: 13.6 g/dL (ref 12.0–15.0)
Immature Granulocytes: 0 %
Lymphocytes Relative: 27 %
Lymphs Abs: 1.9 10*3/uL (ref 0.7–4.0)
MCH: 32.5 pg (ref 26.0–34.0)
MCHC: 34.2 g/dL (ref 30.0–36.0)
MCV: 95 fL (ref 80.0–100.0)
Monocytes Absolute: 0.3 10*3/uL (ref 0.1–1.0)
Monocytes Relative: 5 %
Neutro Abs: 4.6 10*3/uL (ref 1.7–7.7)
Neutrophils Relative %: 66 %
Platelets: 326 10*3/uL (ref 150–400)
RBC: 4.19 MIL/uL (ref 3.87–5.11)
RDW: 14.2 % (ref 11.5–15.5)
WBC: 7 10*3/uL (ref 4.0–10.5)
nRBC: 0 % (ref 0.0–0.2)

## 2022-10-24 LAB — TSH: TSH: 4.777 u[IU]/mL — ABNORMAL HIGH (ref 0.350–4.500)

## 2022-10-24 LAB — LIPID PANEL
Cholesterol: 172 mg/dL (ref 0–200)
HDL: 36 mg/dL — ABNORMAL LOW (ref >40)
LDL Cholesterol: 83 mg/dL (ref 0–99)
Total CHOL/HDL Ratio: 4.8 RATIO
Triglycerides: 264 mg/dL — ABNORMAL HIGH (ref <150)
VLDL: 53 mg/dL — ABNORMAL HIGH (ref 0–40)

## 2022-10-24 NOTE — Progress Notes (Signed)
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  She was found to have diffuse duodenal wall thickening as well as a palpable mass in the left breast.  Duodenal biopsy as well as left breast biopsy was consistent withER positive greater than 90%, PR weakly +1 to 10% HER2 negative lobular breast cancer.  Patient underwent external biliary drain for the obstructive jaundice and palliative gastrojejunostomy surgery to relieve her gastric outlet obstruction.CT scan otherwise did not show any evidence of distant metastatic disease.   Patient was started on letrozole plus Kisqali in early April 2023.  However patient developed AKI and hyperuricemia requiring hospitalizations.  Idelle Jo was therefore stopped and patient was started on Ibrance 75 mg in May 2023.NGS testing showed no actionable mutations.  PD-L1 CPS score 1.  MSI stable.  Tumor mutational burden not high.    Interval history-she is currently on Ibrance 100 mg dosing.  She does report more fatigue as well as nausea.  She gets her PTC drain exchanged every 6 to 8 weeks and has had to keep it open on most occasions.  ECOG PS- 1 Pain scale- 0 Opioid associated constipation- no  Review of systems- Review of Systems  Constitutional:  Positive for malaise/fatigue. Negative  for chills, fever and weight loss.  HENT:  Negative for congestion, ear discharge and nosebleeds.   Eyes:  Negative for blurred vision.  Respiratory:  Negative for cough, hemoptysis, sputum production, shortness of breath and wheezing.   Cardiovascular:  Negative for chest pain, palpitations, orthopnea and claudication.  Gastrointestinal:  Positive for nausea. Negative for abdominal pain, blood in stool, constipation, diarrhea, heartburn, melena and vomiting.  Genitourinary:  Negative for dysuria, flank pain, frequency, hematuria and urgency.  Musculoskeletal:  Negative for back pain, joint pain and myalgias.  Skin:  Negative for rash.  Neurological:  Negative for dizziness, tingling, focal weakness, seizures, weakness and headaches.  Endo/Heme/Allergies:  Does not bruise/bleed easily.  Psychiatric/Behavioral:  Negative for depression and suicidal ideas. The patient does not have insomnia.       Allergies  Allergen Reactions   Peanuts [Peanut Oil] Shortness Of Breath and Itching   Flexeril [Cyclobenzaprine] Palpitations    Elevated heart rate after third day of taking     Past Medical History:  Diagnosis Date   Anemia    Anxiety    Breast cancer (HCC)    Gallstones    GERD (gastroesophageal reflux disease)    Hyperlipidemia    Hypertension    Jaundice 08/29/2021   Rash 08/29/2021   Right upper quadrant abdominal pain 08/29/2021   UTI (urinary tract infection) 08/29/2021     Past Surgical History:  Procedure Laterality Date   BILATERAL SALPINGECTOMY  04/03/1997   CHOLECYSTECTOMY N/A 07/07/2015   Procedure: LAPAROSCOPIC CHOLECYSTECTOMY ;  Surgeon: Leafy Ro, MD;  Location: [facility] ORS;  Service: General;  Laterality: N/A;   ESOPHAGOGASTRODUODENOSCOPY  08/30/2021   Procedure: ESOPHAGOGASTRODUODENOSCOPY (EGD);  Surgeon: Midge Minium, MD;  Location: [facility] ENDOSCOPY;  Service: Endoscopy;;   GALLBLADDER SURGERY  07/07/2015   [facility]   IR BILIARY DILITATION  08/22/2022   IR BILIARY  DRAIN PLACEMENT WITH CHOLANGIOGRAM  08/31/2021   IR BILIARY STENT(S) NEW ACCESS WITH DRAIN  02/08/2022   IR CHOLANGIOGRAM EXISTING TUBE  08/17/2022   IR CONVERT BILIARY DRAIN TO INT EXT BILIARY DRAIN  09/02/2021   IR EXCHANGE BILIARY DRAIN  10/10/2021   IR EXCHANGE BILIARY DRAIN  12/05/2021   IR EXCHANGE BILIARY DRAIN  01/19/2022   IR EXCHANGE BILIARY DRAIN  10/13/2022   IR INT EXT BILIARY DRAIN WITH CHOLANGIOGRAM  07/17/2022   IR RADIOLOGIST EVAL & MGMT  03/01/2022   IR RADIOLOGIST EVAL & MGMT  08/17/2022    Social History   Socioeconomic History   Marital status: Married    Spouse name: [name]   Number of children: 4   Years of education: Not on file   Highest education level: Not on file  Occupational History   Not on file  Tobacco Use   Smoking status: Never   Smokeless tobacco: Never  Vaping Use   Vaping Use: Never used  Substance and Sexual Activity   Alcohol use: No   Drug use: No   Sexual activity: Yes  Other Topics Concern   Not on file  Social History Narrative   Lives with spouse at home: [name] has Parkinson's    Social Determinants of Health   Financial Resource Strain: Low Risk  (11/24/2021)   Overall Financial Resource Strain (CARDIA)    Difficulty of Paying Living Expenses: Not hard at all  Food Insecurity: No Food Insecurity (07/20/2022)   Hunger Vital Sign    Worried About Running Out of Food in the Last Year: Never true    Ran Out of Food in the Last Year: Never true  Transportation Needs: No Transportation Needs (07/20/2022)   PRAPARE - Administrator, Civil Service (Medical): No    Lack of Transportation (Non-Medical): No  Physical Activity: Not on file  Stress: No Stress Concern Present (11/24/2021)   [employer] of Occupational Health - Occupational Stress Questionnaire    Feeling of Stress : Only a little  Social Connections: Unknown (11/24/2021)   Social Connection and Isolation Panel [NHANES]    Frequency of Communication with Friends and  Family: Three times a week    Frequency of Social Gatherings with Friends and Family: Three times a week    Attends Religious Services: Patient declined    Active Member of Clubs or Organizations: Patient declined    Attends Banker Meetings: Patient declined    Marital Status: Married  Catering manager Violence: Not At Risk (07/17/2022)   Humiliation, Afraid, Rape, and Kick questionnaire    Fear of Current or Ex-Partner: No    Emotionally Abused: No    Physically Abused: No    Sexually Abused: No    Family History  Problem Relation Age of Onset   Hypertension Mother    CVA Mother    Ulcers Mother    Emphysema Mother    Heart disease Father    Prostate cancer Father    Macular degeneration Father    Hypertension Sister    Diabetes Sister    Cervical polyp Sister    Hypertension Sister    Diabetes Sister    Lymphoma Sister      Current Outpatient Medications:  acetaminophen (TYLENOL) 325 MG tablet, Take 325 mg by mouth every 6 (six) hours as needed (for pain and sometimes takes 2 if pain is worse)., Disp: , Rfl:    baclofen (LIORESAL) 10 MG tablet, Take 10 mg by mouth 3 (three) times daily as needed., Disp: , Rfl:    letrozole (FEMARA) 2.5 MG tablet, Take 1 tablet (2.5 mg total) by mouth daily., Disp: 30 tablet, Rfl: 3   levothyroxine (SYNTHROID) 88 MCG tablet, Take 1 tablet by mouth once daily, Disp: 90 tablet, Rfl: 0   Multiple Vitamins-Minerals (CENTRUM VITAMINTS PO), Take 1 tablet by mouth daily., Disp: , Rfl:    ondansetron (ZOFRAN) 4 MG tablet, Take 1 tablet (4 mg total) by mouth daily as needed for nausea or vomiting., Disp: 30 tablet, Rfl: 1   oxyCODONE (ROXICODONE) 5 MG immediate release tablet, Take 1 tablet (5 mg total) by mouth every 4 (four) hours as needed for severe pain., Disp: 30 tablet, Rfl: 0   palbociclib (IBRANCE) 100 MG tablet, Take 1 tablet (100 mg total) by mouth daily. Take for 21 days on, 7 days off, repeat every 28 days., Disp: 21 tablet,  Rfl: 0   pantoprazole (PROTONIX) 40 MG tablet, Take 40 mg by mouth daily., Disp: , Rfl:    prochlorperazine (COMPAZINE) 5 MG tablet, Take 1 tablet (5 mg total) by mouth every 6 (six) hours as needed for refractory nausea / vomiting., Disp: 30 tablet, Rfl: 1   traZODone (DESYREL) 50 MG tablet, Take 50 mg by mouth at bedtime., Disp: , Rfl:   Physical exam:  Vitals:   10/24/22 1323  BP: 108/71  Pulse: 73  Resp: 18  Temp: 98.1 F (36.7 C)  TempSrc: Tympanic  Weight: 135 lb (61.2 kg)  Height: 5\' 3"  (1.6 m)   Physical Exam Cardiovascular:     Rate and Rhythm: Normal rate and regular rhythm.     Heart sounds: Normal heart sounds.  Pulmonary:     Effort: Pulmonary effort is normal.     Breath sounds: Normal breath sounds.  Abdominal:     Palpations: Abdomen is soft.     Comments: PTC in place  Skin:    General: Skin is warm and dry.  Neurological:     Mental Status: She is alert and oriented to person, place, and time.         Latest Ref Rng & Units 10/24/2022    1:02 PM  CMP  Glucose 70 - 99 mg/dL 098   BUN 8 - 23 mg/dL 20   Creatinine 1.19 - 1.00 mg/dL 1.47   Sodium 829 - 562 mmol/L 134   Potassium 3.5 - 5.1 mmol/L 4.1   Chloride 98 - 111 mmol/L 103   CO2 22 - 32 mmol/L 24   Calcium 8.9 - 10.3 mg/dL 9.3   Total Protein 6.5 - 8.1 g/dL 7.9   Total Bilirubin 0.3 - 1.2 mg/dL 0.3   Alkaline Phos 38 - 126 U/L 92   AST 15 - 41 U/L 38   ALT 0 - 44 U/L 58       Latest Ref Rng & Units 10/24/2022    1:02 PM  CBC  WBC 4.0 - 10.5 K/uL 7.0   Hemoglobin 12.0 - 15.0 g/dL 13.0   Hematocrit 86.5 - 46.0 % 39.8   Platelets 150 - 400 K/uL 326     No images are attached to the encounter.  IR EXCHANGE BILIARY DRAIN  Result Date: 10/13/2022 INDICATION: 67 year old female with  history of metastatic breast cancer to the duodenum resulting in malignant biliary obstruction necessitating percutaneous biliary drain placement originally on 08/31/2021 followed by covered common bile duct  stent placement on 02/08/2022. The patient unfortunately presented with recurrent biliary obstruction in February 2024 requiring additional right-sided internal external biliary drain placement. EXAM: Biliary drain exchange MEDICATIONS: Rocephin 2 gm IV; The antibiotic was administered within an appropriate time frame prior to the initiation of the procedure. ANESTHESIA/SEDATION: Moderate (conscious) sedation was employed during this procedure. A total of Versed 2 mg and Fentanyl 100 mcg was administered intravenously. Moderate Sedation Time: 22 minutes. The patient's level of consciousness and vital signs were monitored continuously by radiology nursing throughout the procedure under my direct supervision. FLUOROSCOPY TIME:  Thirty-six mGy COMPLICATIONS: None immediate. PROCEDURE: Informed written consent was obtained from the patient after a thorough discussion of the procedural risks, benefits and alternatives. All questions were addressed. Maximal Sterile Barrier Technique was utilized including caps, mask, sterile gowns, sterile gloves, sterile drape, hand hygiene and skin antiseptic. A timeout was performed prior to the initiation of the procedure. The indwelling were biliary drain in the right upper quadrant prepped and draped in standard fashion. Subdermal Local anesthesia provided near the drain entry site with 1% lidocaine. Gentle hand injection demonstrated occlusion of the common bile duct portion of the indwelling biliary drain with minimal right-sided biliary ductal dilation. With moderate difficulty, the drain was recanalized with a catheter and wire which was positioned in the distal portion of the duodenum and the drain was removed. Over the wire, a new, 14 [place] biliary drain with approximately 7 cm of custom extended sideholes was placed. Hand injection demonstrated patency with adequate drainage of the right-sided biliary tree. The drain was placed to bag drainage. 0 silk retention suture was  placed. A sterile bandage was applied. The patient tolerated the procedure well was transferred to the recovery area in good condition. IMPRESSION: Technically successful 53 French biliary drain exchange with approximately 7 cm of extended custom sideholes. PLAN: Return in 6 weeks for routine biliary check and change. Marliss Coots, MD [facility] Electronically Signed   By: Marliss Coots M.D.   On: 10/13/2022 15:40     Assessment and plan- Patient is a 67 y.o. female  with metastatic ER/PR positive HER2 negative lobular breast cancer presenting with duodenal obstruction and obstructive jaundice s/p palliative gastrojejunostomy.   Obstructive jaundice: S/p PTCA.  Bilirubin is normal.  ALT is mildly elevated at 58 which we will continue to monitor.  I will repeat labs in 4 weeks and 8 weeks and see her back in 8 weeks with CT chest abdomen and pelvis with contrast and bone scan prior.  Tumor markers have remained stable.  I am lowering the dose of Ibrance to 75 mg starting next cycle and see if she will tolerate that better.  Continue letrozole   Visit Diagnosis 1. 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 Abnormal LFTs   3. High risk medication use   4. Use of letrozole (Femara)      Dr. Owens Shark, MD, MPH [facility] [phone] 10/24/2022 3:38 PM

## 2022-10-31 ENCOUNTER — Other Ambulatory Visit (HOSPITAL_COMMUNITY): Payer: Self-pay

## 2022-10-31 ENCOUNTER — Other Ambulatory Visit: Payer: Self-pay

## 2022-10-31 ENCOUNTER — Other Ambulatory Visit: Payer: Self-pay | Admitting: Pharmacist

## 2022-10-31 DIAGNOSIS — C50912 Malignant neoplasm of unspecified site of left female breast: Secondary | ICD-10-CM

## 2022-10-31 MED ORDER — PALBOCICLIB 75 MG PO TABS
75.0000 mg | ORAL_TABLET | Freq: Every day | ORAL | 0 refills | Status: DC
Start: 2022-10-31 — End: 2022-11-30
  Filled 2022-10-31: qty 21, 28d supply, fill #0
  Filled 2022-10-31: qty 21, 21d supply, fill #0

## 2022-11-02 ENCOUNTER — Other Ambulatory Visit: Payer: Self-pay | Admitting: *Deleted

## 2022-11-02 MED ORDER — LETROZOLE 2.5 MG PO TABS: 2.5000 mg | ORAL_TABLET | Freq: Every day | ORAL | 1 refills | Status: DC

## 2022-11-06 ENCOUNTER — Emergency Department: Payer: Medicare HMO

## 2022-11-06 ENCOUNTER — Other Ambulatory Visit: Payer: Self-pay

## 2022-11-06 ENCOUNTER — Emergency Department
Admission: EM | Admit: 2022-11-06 | Discharge: 2022-11-06 | Disposition: A | Discharge status: Home / Self Care | Payer: Medicare HMO | Attending: Emergency Medicine | Admitting: Emergency Medicine

## 2022-11-06 DIAGNOSIS — C801 Malignant (primary) neoplasm, unspecified: Secondary | ICD-10-CM | POA: Diagnosis not present

## 2022-11-06 DIAGNOSIS — R6883 Chills (without fever): Secondary | ICD-10-CM | POA: Insufficient documentation

## 2022-11-06 DIAGNOSIS — Z4803 Encounter for change or removal of drains: Secondary | ICD-10-CM | POA: Diagnosis not present

## 2022-11-06 DIAGNOSIS — C787 Secondary malignant neoplasm of liver and intrahepatic bile duct: Secondary | ICD-10-CM | POA: Diagnosis not present

## 2022-11-06 DIAGNOSIS — Z853 Personal history of malignant neoplasm of breast: Secondary | ICD-10-CM | POA: Diagnosis not present

## 2022-11-06 DIAGNOSIS — N200 Calculus of kidney: Secondary | ICD-10-CM | POA: Diagnosis not present

## 2022-11-06 DIAGNOSIS — R7401 Elevation of levels of liver transaminase levels: Secondary | ICD-10-CM

## 2022-11-06 DIAGNOSIS — R109 Unspecified abdominal pain: Secondary | ICD-10-CM | POA: Insufficient documentation

## 2022-11-06 DIAGNOSIS — K838 Other specified diseases of biliary tract: Secondary | ICD-10-CM | POA: Diagnosis not present

## 2022-11-06 DIAGNOSIS — R918 Other nonspecific abnormal finding of lung field: Secondary | ICD-10-CM | POA: Diagnosis not present

## 2022-11-06 DIAGNOSIS — I10 Essential (primary) hypertension: Secondary | ICD-10-CM | POA: Diagnosis not present

## 2022-11-06 LAB — LACTIC ACID, PLASMA: Lactic Acid, Venous: 1.4 mmol/L (ref 0.5–1.9)

## 2022-11-06 LAB — URINALYSIS, COMPLETE (UACMP) WITH MICROSCOPIC
Bacteria, UA: NONE SEEN
Bilirubin Urine: NEGATIVE
Glucose, UA: NEGATIVE mg/dL
Hgb urine dipstick: NEGATIVE
Ketones, ur: NEGATIVE mg/dL
Nitrite: NEGATIVE
Protein, ur: NEGATIVE mg/dL
Specific Gravity, Urine: 1.015 (ref 1.005–1.030)
pH: 5 (ref 5.0–8.0)

## 2022-11-06 LAB — CBC WITH DIFFERENTIAL/PLATELET
Abs Immature Granulocytes: 0.02 10*3/uL (ref 0.00–0.07)
Basophils Absolute: 0 10*3/uL (ref 0.0–0.1)
Basophils Relative: 0 %
Eosinophils Absolute: 0 10*3/uL (ref 0.0–0.5)
Eosinophils Relative: 0 %
HCT: 40.2 % (ref 36.0–46.0)
Hemoglobin: 13.7 g/dL (ref 12.0–15.0)
Immature Granulocytes: 0 %
Lymphocytes Relative: 6 %
Lymphs Abs: 0.6 10*3/uL — ABNORMAL LOW (ref 0.7–4.0)
MCH: 31.8 pg (ref 26.0–34.0)
MCHC: 34.1 g/dL (ref 30.0–36.0)
MCV: 93.3 fL (ref 80.0–100.0)
Monocytes Absolute: 0.7 10*3/uL (ref 0.1–1.0)
Monocytes Relative: 8 %
Neutro Abs: 8.3 10*3/uL — ABNORMAL HIGH (ref 1.7–7.7)
Neutrophils Relative %: 86 %
Platelets: 220 10*3/uL (ref 150–400)
RBC: 4.31 MIL/uL (ref 3.87–5.11)
RDW: 13.6 % (ref 11.5–15.5)
WBC: 9.7 10*3/uL (ref 4.0–10.5)
nRBC: 0 % (ref 0.0–0.2)

## 2022-11-06 LAB — COMPREHENSIVE METABOLIC PANEL
ALT: 126 U/L — ABNORMAL HIGH (ref 0–44)
AST: 124 U/L — ABNORMAL HIGH (ref 15–41)
Albumin: 4 g/dL (ref 3.5–5.0)
Alkaline Phosphatase: 98 U/L (ref 38–126)
Anion gap: 6 (ref 5–15)
BUN: 19 mg/dL (ref 8–23)
CO2: 19 mmol/L — ABNORMAL LOW (ref 22–32)
Calcium: 9.3 mg/dL (ref 8.9–10.3)
Chloride: 109 mmol/L (ref 98–111)
Creatinine, Ser: 0.98 mg/dL (ref 0.44–1.00)
GFR, Estimated: >60 mL/min (ref >60)
Glucose, Bld: 179 mg/dL — ABNORMAL HIGH (ref 70–99)
Potassium: 3.8 mmol/L (ref 3.5–5.1)
Sodium: 134 mmol/L — ABNORMAL LOW (ref 135–145)
Total Bilirubin: 1.6 mg/dL — ABNORMAL HIGH (ref 0.3–1.2)
Total Protein: 7.6 g/dL (ref 6.5–8.1)

## 2022-11-06 LAB — APTT: aPTT: 28 seconds (ref 24–36)

## 2022-11-06 LAB — PROTIME-INR
INR: 1.2 (ref 0.8–1.2)
Prothrombin Time: 15.5 seconds — ABNORMAL HIGH (ref 11.4–15.2)

## 2022-11-06 LAB — LIPASE, BLOOD: Lipase: 84 U/L — ABNORMAL HIGH (ref 11–51)

## 2022-11-06 MED ORDER — IOHEXOL 300 MG/ML  SOLN
100.0000 mL | Freq: Once | INTRAMUSCULAR | Status: AC | PRN
  Administered 2022-11-06: 100 mL via INTRAVENOUS

## 2022-11-06 MED ORDER — LEVOFLOXACIN 750 MG PO TABS: 750.0000 mg | ORAL_TABLET | Freq: Every day | ORAL | 0 refills | Status: AC

## 2022-11-06 MED ORDER — LACTATED RINGERS IV BOLUS (SEPSIS)
1000.0000 mL | Freq: Once | INTRAVENOUS | Status: AC
  Administered 2022-11-06: 1000 mL via INTRAVENOUS

## 2022-11-06 NOTE — ED Triage Notes (Signed)
Pt. Reports biliary drain that has been clogged since 10 am 11/05/2022, pt. States she has tried flushing multiple times and it still will not drain, says she cannot do a pull back on the drain due to it entering her intestines, c/o abdominal pressure, temp 100.1, and chills, HX of stage 4 breast cancer with mets to small intestine

## 2022-11-06 NOTE — ED Provider Notes (Signed)
Care of this patient assumed from prior physician at 0700 pending CT, reevaluation, and disposition.  Please see initial physician note for further details.  Briefly this is a 67 year old female with history of metastatic breast cancer complicated by gastric outlet obstruction s/p percutaneous biliary drain placement presenting with concerns for malfunction of her biliary drain due to decreased drainage.  She was also noted to have a temp of 100.1 at home and is on chemotherapy.  Here, workup demonstrated normal white blood cell count and hemoglobin.  UA without evidence of infection.  Lactate 1.4.  Lipase slightly elevated at 84.CMP with mild transaminitis with AST and ALT of 124 and 126.  She had a chest x-Khamari Sheehan with areas of atelectasis versus consolidation.  She was signed out to me pending a CT chest, abdomen, pelvis.  This demonstrated a normally positioned internalize biliary drain with unremarkable positioning of a metallic biliary stent which has been extended.  There is persistent biliary dilatation to the level of the CBD noted as well as persistent pancreatic ductal dilatation.  Atelectasis, but no focal consolidation noted at the lung bases.  No obvious acute surgical problem based on imaging.  Will discuss case with oncology.  [id number]: Case discussed with Dr. Donneta Romberg with oncology.  He reviewed the patient's case.  He did feel that the patient could be discharged on oral Levaquin with plans for close outpatient follow-up next week.  Patient was reevaluated.  She is comfortable this plan.  Strict return precautions provided.  Patient discharged in stable condition.   Trinna Post, MD 11/06/22 [phone]

## 2022-11-06 NOTE — ED Provider Notes (Addendum)
[facility] Provider Note    Event Date/Time   First MD Initiated Contact with Patient 11/06/22 [phone]     (approximate)   History   biliary drain issue   HPI Diamond Hinton is a 67 y.o. 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  She is currently undergoing chemotherapy.  She has a palliative gastrojejunostomy to relieve the gastric outlet obstruction and has an external biliary drain.  She presents tonight with concerns of a malfunction to her biliary drain.  She said that it is draining a little bit of liquid but much less than usual.  She does not really have any pain but she has been unsuccessful flushing the drain.  She has been told not to drop back on it due to its placement in her intestines.  She is due for it to be replaced in a couple of weeks and it was replaced about 3 weeks ago by interventional radiology.  She is also bit concerned because she was feeling ill and started to have chills at home.  She had a Tmax at home of 100.1.  She has not had any vomiting though she has had some nausea.  No dysuria, no chest pain, no shortness of breath, no cough.  No neck pain or stiffness.     Physical Exam   Triage Vital Signs: ED Triage Vitals  Enc Vitals Group     BP 11/06/22 0424 121/68     Pulse Rate 11/06/22 0424 (!) 112     Resp 11/06/22 0424 17     Temp 11/06/22 0424 98.9 F (37.2 C)     Temp Source 11/06/22 0424 Oral     SpO2 11/06/22 0424 95 %     Weight 11/06/22 0425 61.2 kg (135 lb)     Height 11/06/22 0425 1.6 m (5\' 3" )     Head Circumference --      Peak Flow --      Pain Score 11/06/22 0425 0     Pain Loc --      Pain Edu? --      Excl. in GC? --     Most recent vital signs: Vitals:   11/06/22 0600 11/06/22 0611  BP:    Pulse: 91 83  Resp:  17  Temp:    SpO2:  96%    General: Awake, no  distress.  Generally well-appearing in spite of her diagnoses. CV:  Good peripheral perfusion.  Regular rate and rhythm. Resp:  Normal effort. Speaking easily and comfortably, no accessory muscle usage nor intercostal retractions.   Abd:  No distention.  Mild generalized tenderness to palpation throughout the abdomen but with no rebound or guarding.  Drain has some biliary contents in the bag but patient says this is much less than usual. Other:  Patient is awake and alert x 3, mood and affect are normal and appropriate.  She shows good insight into her condition and appropriate judgment.  I believe that she has the capacity to make her own decisions.  Her adult daughter is with her at bedside.   ED Results / Procedures / Treatments   Labs (all labs ordered are listed, but only abnormal results are displayed) Labs Reviewed  COMPREHENSIVE METABOLIC PANEL - Abnormal; Notable for the following components:      Result Value   Sodium 134 (*)    CO2  19 (*)    Glucose, Bld 179 (*)    AST 124 (*)    ALT 126 (*)    Total Bilirubin 1.6 (*)    All other components within normal limits  CBC WITH DIFFERENTIAL/PLATELET - Abnormal; Notable for the following components:   Neutro Abs 8.3 (*)    Lymphs Abs 0.6 (*)    All other components within normal limits  PROTIME-INR - Abnormal; Notable for the following components:   Prothrombin Time 15.5 (*)    All other components within normal limits  URINALYSIS, COMPLETE (UACMP) WITH MICROSCOPIC - Abnormal; Notable for the following components:   Color, Urine AMBER (*)    APPearance CLOUDY (*)    Leukocytes,Ua SMALL (*)    All other components within normal limits  LIPASE, BLOOD - Abnormal; Notable for the following components:   Lipase 84 (*)    All other components within normal limits  CULTURE, BLOOD (ROUTINE X 2)  CULTURE, BLOOD (ROUTINE X 2)  LACTIC ACID, PLASMA  APTT     EKG  ED ECG REPORT I, Loleta Rose, the attending physician,  personally viewed and interpreted this ECG.  Date: 11/06/2022 EKG Time: 6:07 AM Rate: 83 Rhythm: normal sinus rhythm QRS Axis: normal Intervals: normal ST/T Wave abnormalities: normal Narrative Interpretation: no evidence of acute ischemia    RADIOLOGY CT chest/abdomen/pelvis pending at time of transfer of care to Dr. Rosalia Hammers.   PROCEDURES:  Critical Care performed: No  .1-3 Lead EKG Interpretation  Performed by: Loleta Rose, MD Authorized by: Loleta Rose, MD     Interpretation: normal     ECG rate:  82   ECG rate assessment: normal     Rhythm: sinus rhythm     Ectopy: none     Conduction: normal       IMPRESSION / MDM / ASSESSMENT AND PLAN / ED COURSE  I reviewed the triage vital signs and the nursing notes.                              Differential diagnosis includes, but is not limited to, infectious process such as pneumonia or UTI, intra-abdominal infection secondary to drain placement or gastric outlet obstruction, malfunction of biliary drain, electrolyte or metabolic abnormality.  Patient's presentation is most consistent with acute presentation with potential threat to life or bodily function.  Labs/studies ordered: Urinalysis, chest x-ray, blood cultures x 2, lactic acid, lipase, CMP, pro time INR, APTT, CBC with differential, CT chest/abdomen/pelvis with IV contrast.  Interventions/Medications given:  Medications  lactated ringers bolus 1,000 mL (1,000 mLs Intravenous New Bag/Given 11/06/22 0545)  iohexol (OMNIPAQUE) 300 MG/ML solution 100 mL (100 mLs Intravenous Contrast Given 11/06/22 0723)    (Note:  hospital course my include additional interventions and/or labs/studies not listed above.)   I have a low degree of suspicion of acute infectious process but it is concerning that the patient is immunocompromised and reports that she had a Tmax at home of 100.1.  I am initiating a "possible sepsis workup", although her vital signs here are reassuring.   She is in no distress although she has some abdominal discomfort but this is to be anticipated.  I viewed and interpreted the patient's 1 view chest x-ray and she does have some abnormalities that could represent an infectious process.  The radiologist described it as irregular bibasilar opacities that could be atelectasis but could also represent consolidation.  Labs are generally reassuring  and stable with some degree of AST and ALT elevation and a lipase of 84 which is up slightly from the last time was checked several months ago.  Again however she does not have much abdominal tenderness.  I ordered a CT of the abdomen pelvis with IV contrast to better evaluate the possibility of intra-abdominal pathology but also specifically to identify the placement of the drain.  I explained to the patient that she may require interventional radiology procedure if the drain needs to be replaced.  However given that it is a holiday weekend, it is possible she may require transfer to [facility] interventional radiology is not covering illness this weekend although I suspect they will be able to do so.  The patient and the daughter are very understanding and agreeable to what ever plan is necessary.  After seeing the chest x-ray and the concern for possible consolidation, the order for the CT scan has been changed to CT chest/abdomen/pelvis.  I am transferring ED care to Dr. Rosalia Hammers to follow-up with hematology/oncology about the patient's care, as well as to talk with IR if necessary to make sure that they can provide the necessary care at [facility] this weekend based on the CT scan results.  Of note, the patient addressed with me that she would like to have a DNR order put in place.  We had an extensive conversation with her adult daughter present.  We talked about how she understands that this means that she will not have chest compressions or intubation when or if she ends up in that situation, but she understands that  this means that she will still treated for reversible issues (such as if she has an acute infection requiring antibiotics, for example), fluids, her current oncology treatment plan, etc.  I feel that she has capacity to make this decision, and although she acknowledged that she has not addressed these issues with Dr. Wan Robert, she feels strongly about it right now and I think it is a reasonable thing to do for her.  I filled out the [facility] DNR form and put the order into the computer for her.  The patient is on the cardiac monitor to evaluate for evidence of arrhythmia and/or significant heart rate changes.       FINAL CLINICAL IMPRESSION(S) / ED DIAGNOSES   Possible biliary drain malfunction Possible infection while immunocompromised   Rx / DC Orders   ED Discharge Orders     None        Note:  This document was prepared using Dragon voice recognition software and may include unintentional dictation errors.   Loleta Rose, MD 11/06/22 0981    Loleta Rose, MD 11/06/22 [phone]

## 2022-11-06 NOTE — Discharge Instructions (Addendum)
You were seen in the emergency department today for evaluation of chills as well as concerns about your biliary drain. Your CT scan fortunately showed that your biliary drain is appropriately positioned as well as your metallic biliary stents.  Your liver enzymes were slightly higher than previously.  We reviewed the case with the on-call oncologist.  We will send you home with a prescription for an antibiotic called Levaquin that you should take as directed.  Please follow-up with your oncologist this week for reevaluation.  Return to the ER for any new or worsening symptoms.

## 2022-11-07 ENCOUNTER — Other Ambulatory Visit (HOSPITAL_COMMUNITY): Payer: Self-pay

## 2022-11-08 LAB — CULTURE, BLOOD (ROUTINE X 2)
Culture: NO GROWTH
Culture: NO GROWTH

## 2022-11-09 ENCOUNTER — Telehealth: Payer: Self-pay | Admitting: *Deleted

## 2022-11-09 LAB — CULTURE, BLOOD (ROUTINE X 2)

## 2022-11-09 NOTE — Transitions of Care (Post Inpatient/ED Visit) (Signed)
   11/09/2022  Name: Diamond Hinton MRN: [id number] DOB: [birth year]  Today's TOC FU Call Status: Today's TOC FU Call Status:: Unsuccessul Call (1st Attempt) Unsuccessful Call (1st Attempt) Date: 11/09/22  Attempted to reach the patient regarding the most recent Inpatient/ED visit.  Follow Up Plan: Additional outreach attempts will be made to reach the patient to complete the Transitions of Care (Post Inpatient/ED visit) call.   Gean Maidens BSN RN [facility] [phone]

## 2022-11-10 ENCOUNTER — Telehealth: Payer: Self-pay | Admitting: *Deleted

## 2022-11-10 LAB — CULTURE, BLOOD (ROUTINE X 2)

## 2022-11-10 NOTE — Transitions of Care (Post Inpatient/ED Visit) (Signed)
11/10/2022  Name: Diamond Hinton MRN: [id number] DOB: [birth year]  Today's TOC FU Call Status: Today's TOC FU Call Status:: Successful TOC FU Call Competed TOC FU Call Complete Date: 11/10/22  Transition Care Management Follow-up Telephone Call Date of Discharge: 11/06/22 Discharge Facility: [facility]) Type of Discharge: Emergency Department Reason for ED Visit: Other: (chills/ Transaminitis) How have you been since you were released from the hospital?: Better (Better in terms of the no more fever or chills. Patient is having car difficulty as she was coming home from the hospital but will be following up with an appt as soon as she gets it out.) Any questions or concerns?: Yes Patient Questions/Concerns:: Pt has a poor appetite and is drinking some ensure. She does not like anything sweet Patient Questions/Concerns Addressed: Other: (Talked with patient about drinks to help get in extra calories such as carnation instant breakfast.)  Items Reviewed: Did you receive and understand the discharge instructions provided?: Yes Medications obtained,verified, and reconciled?: Yes (Medications Reviewed) Any new allergies since your discharge?: No Dietary orders reviewed?: No Do you have support at home?: Yes People in Home: alone Name of Support/Comfort Primary Source: Victorino Dike  Medications Reviewed Today: Medications Reviewed Today     Reviewed by Luella Cook, RN (Case Manager) on 11/10/22 at 1422  Med List Status: <None>   Medication Order Taking? Sig Documenting Provider Last Dose Status Informant  acetaminophen (TYLENOL) 325 MG tablet [id number] Yes Take 325 mg by mouth every 6 (six) hours as needed (for pain and sometimes takes 2 if pain is worse). [provider] Taking Active Self, Pharmacy Records  baclofen (LIORESAL) 10 MG tablet [id number] Yes Take 10 mg by mouth 3 (three) times daily as needed. [provider] Taking Active    letrozole [facility]) 2.5 MG tablet [id number] Yes Take 1 tablet (2.5 mg total) by mouth daily. Creig Hines, MD Taking Active   levofloxacin [facility]) 750 MG tablet [id number] Yes Take 1 tablet (750 mg total) by mouth daily for 7 days. Trinna Post, MD Taking Active   levothyroxine (SYNTHROID) 88 MCG tablet [id number] Yes Take 1 tablet by mouth once daily Erasmo Downer, MD Taking Active   Multiple Vitamins-Minerals (CENTRUM VITAMINTS PO) [id number] Yes Take 1 tablet by mouth daily. [provider] Taking Active Self, Pharmacy Records  ondansetron (ZOFRAN) 4 MG tablet [id number] Yes Take 1 tablet (4 mg total) by mouth daily as needed for nausea or vomiting. Creig Hines, MD Taking Active Self, Pharmacy Records  oxyCODONE (ROXICODONE) 5 MG immediate release tablet [id number] Yes Take 1 tablet (5 mg total) by mouth every 4 (four) hours as needed for severe pain. Erasmo Downer, MD Taking Active   palbociclib [facility]) 75 MG tablet [id number] Yes Take 1 tablet (75 mg total) by mouth daily. Take for 21 days on, 7 days off, repeat every 28 days. Creig Hines, MD Taking Active   pantoprazole (PROTONIX) 40 MG tablet [id number] Yes Take 40 mg by mouth daily. [provider] Taking Active   prochlorperazine (COMPAZINE) 5 MG tablet [id number] Yes Take 1 tablet (5 mg total) by mouth every 6 (six) hours as needed for refractory nausea / vomiting. Creig Hines, MD Taking Active Self, Pharmacy Records  traZODone (DESYREL) 50 MG tablet [id number] Yes Take 50 mg by mouth at bedtime. [provider] Taking Active   Med List Note Remi Haggard, RPH-CPP 08/10/22 1158): Ilda Foil filled at [facility] (Specialty)  Home Care and Equipment/Supplies: Were Home Health Services Ordered?: NA Any new equipment or medical supplies ordered?: NA  Functional Questionnaire: Do you need assistance with bathing/showering or dressing?: No Do you need assistance with  meal preparation?: Yes Do you need assistance with eating?: No Do you have difficulty maintaining continence: No Do you need assistance with getting out of bed/getting out of a chair/moving?: No Do you have difficulty managing or taking your medications?: No  Follow up appointments reviewed: PCP Follow-up appointment confirmed?: NA Specialist Hospital Follow-up appointment confirmed?: No Reason Specialist Follow-Up Not Confirmed: Patient has Specialist Provider Number and will Call for Appointment Do you need transportation to your follow-up appointment?: No (patient has a car she put in the shop and will be getting out soon) Do you understand care options if your condition(s) worsen?: Yes-patient verbalized understanding  SDOH Interventions Today    Flowsheet Row Most Recent Value  SDOH Interventions   Food Insecurity Interventions Intervention Not Indicated  Housing Interventions Intervention Not Indicated  Transportation Interventions Intervention Not Indicated      Interventions Today    Flowsheet Row Most Recent Value  General Interventions   General Interventions Discussed/Reviewed General Interventions Discussed, General Interventions Reviewed, Doctor Visits  Doctor Visits Discussed/Reviewed Doctor Visits Discussed  [patient will schedule appt with Dr Rao]  Mental Health Interventions   Mental Health Discussed/Reviewed Other  [Patient discussed the stress of having car break down but it is in the shop and she will be getting it out as soon as repaired.]  Nutrition Interventions   Nutrition Discussed/Reviewed Nutrition Discussed, Nutrition Reviewed  Pharmacy Interventions   Pharmacy Dicussed/Reviewed Pharmacy Topics Discussed      TOC Interventions Today    Flowsheet Row Most Recent Value  TOC Interventions   TOC Interventions Discussed/Reviewed TOC Interventions Discussed, TOC Interventions Reviewed       Gean Maidens BSN RN [facility]  [facility] [phone] .

## 2022-11-11 LAB — CULTURE, BLOOD (ROUTINE X 2)

## 2022-11-14 DIAGNOSIS — Z0100 Encounter for examination of eyes and vision without abnormal findings: Secondary | ICD-10-CM | POA: Diagnosis not present

## 2022-11-22 ENCOUNTER — Other Ambulatory Visit: Payer: Self-pay | Admitting: *Deleted

## 2022-11-22 ENCOUNTER — Inpatient Hospital Stay: Payer: Medicare HMO | Attending: Oncology

## 2022-11-22 DIAGNOSIS — C784 Secondary malignant neoplasm of small intestine: Secondary | ICD-10-CM | POA: Diagnosis not present

## 2022-11-22 DIAGNOSIS — Z170 Estrogen receptor positive status [ER+]: Secondary | ICD-10-CM | POA: Diagnosis not present

## 2022-11-22 DIAGNOSIS — C50912 Malignant neoplasm of unspecified site of left female breast: Secondary | ICD-10-CM

## 2022-11-22 DIAGNOSIS — R7989 Other specified abnormal findings of blood chemistry: Secondary | ICD-10-CM

## 2022-11-22 DIAGNOSIS — C50919 Malignant neoplasm of unspecified site of unspecified female breast: Secondary | ICD-10-CM | POA: Insufficient documentation

## 2022-11-22 LAB — COMPREHENSIVE METABOLIC PANEL
ALT: 51 U/L — ABNORMAL HIGH (ref 0–44)
AST: 40 U/L (ref 15–41)
Albumin: 4.1 g/dL (ref 3.5–5.0)
Alkaline Phosphatase: 277 U/L — ABNORMAL HIGH (ref 38–126)
Anion gap: 11 (ref 5–15)
BUN: 30 mg/dL — ABNORMAL HIGH (ref 8–23)
CO2: 20 mmol/L — ABNORMAL LOW (ref 22–32)
Calcium: 9.2 mg/dL (ref 8.9–10.3)
Chloride: 99 mmol/L (ref 98–111)
Creatinine, Ser: 1.21 mg/dL — ABNORMAL HIGH (ref 0.44–1.00)
GFR, Estimated: 49 mL/min — ABNORMAL LOW (ref >60)
Glucose, Bld: 150 mg/dL — ABNORMAL HIGH (ref 70–99)
Potassium: 4.3 mmol/L (ref 3.5–5.1)
Sodium: 130 mmol/L — ABNORMAL LOW (ref 135–145)
Total Bilirubin: 1.1 mg/dL (ref 0.3–1.2)
Total Protein: 8.4 g/dL — ABNORMAL HIGH (ref 6.5–8.1)

## 2022-11-22 LAB — CBC WITH DIFFERENTIAL/PLATELET
Abs Immature Granulocytes: 0.05 10*3/uL (ref 0.00–0.07)
Basophils Absolute: 0.1 10*3/uL (ref 0.0–0.1)
Basophils Relative: 1 %
Eosinophils Absolute: 0 10*3/uL (ref 0.0–0.5)
Eosinophils Relative: 0 %
HCT: 39.8 % (ref 36.0–46.0)
Hemoglobin: 13.5 g/dL (ref 12.0–15.0)
Immature Granulocytes: 1 %
Lymphocytes Relative: 17 %
Lymphs Abs: 1.8 10*3/uL (ref 0.7–4.0)
MCH: 31.4 pg (ref 26.0–34.0)
MCHC: 33.9 g/dL (ref 30.0–36.0)
MCV: 92.6 fL (ref 80.0–100.0)
Monocytes Absolute: 0.4 10*3/uL (ref 0.1–1.0)
Monocytes Relative: 4 %
Neutro Abs: 8 10*3/uL — ABNORMAL HIGH (ref 1.7–7.7)
Neutrophils Relative %: 77 %
Platelets: 467 10*3/uL — ABNORMAL HIGH (ref 150–400)
RBC: 4.3 MIL/uL (ref 3.87–5.11)
RDW: 13.1 % (ref 11.5–15.5)
WBC: 10.3 10*3/uL (ref 4.0–10.5)
nRBC: 0 % (ref 0.0–0.2)

## 2022-11-22 MED ORDER — ONDANSETRON HCL 4 MG PO TABS: 4.0000 mg | ORAL_TABLET | Freq: Every day | ORAL | 1 refills | Status: DC | PRN

## 2022-11-23 ENCOUNTER — Other Ambulatory Visit: Payer: Self-pay | Admitting: Internal Medicine

## 2022-11-23 DIAGNOSIS — K311 Adult hypertrophic pyloric stenosis: Secondary | ICD-10-CM

## 2022-11-23 LAB — CANCER ANTIGEN 27.29: CA 27.29: 24.1 U/mL (ref 0.0–38.6)

## 2022-11-23 NOTE — Progress Notes (Signed)
Patient for IR Biliary Drain Exchange on Fri 11/24/2022,  I called and spoke with the patient on the phone and gave pre-procedure instructions. Pt was made aware to be here at 10a, NPO after MN prior to procedure as well as driver post procedure/recovery/discharge. Pt stated understanding.  Called 11/23/2022

## 2022-11-24 ENCOUNTER — Other Ambulatory Visit: Payer: Self-pay

## 2022-11-24 ENCOUNTER — Other Ambulatory Visit: Payer: Self-pay | Admitting: Student

## 2022-11-24 ENCOUNTER — Encounter: Payer: Self-pay | Admitting: Radiology

## 2022-11-24 ENCOUNTER — Ambulatory Visit
Admission: RE | Admit: 2022-11-24 | Discharge: 2022-11-24 | Disposition: A | Discharge status: Home / Self Care | Payer: Medicare HMO | Source: Ambulatory Visit | Attending: Interventional Radiology | Admitting: Interventional Radiology

## 2022-11-24 DIAGNOSIS — Z66 Do not resuscitate: Secondary | ICD-10-CM | POA: Insufficient documentation

## 2022-11-24 DIAGNOSIS — Z853 Personal history of malignant neoplasm of breast: Secondary | ICD-10-CM | POA: Diagnosis not present

## 2022-11-24 DIAGNOSIS — D649 Anemia, unspecified: Secondary | ICD-10-CM | POA: Insufficient documentation

## 2022-11-24 DIAGNOSIS — I10 Essential (primary) hypertension: Secondary | ICD-10-CM | POA: Diagnosis not present

## 2022-11-24 DIAGNOSIS — F419 Anxiety disorder, unspecified: Secondary | ICD-10-CM | POA: Insufficient documentation

## 2022-11-24 DIAGNOSIS — K831 Obstruction of bile duct: Secondary | ICD-10-CM | POA: Diagnosis not present

## 2022-11-24 DIAGNOSIS — K311 Adult hypertrophic pyloric stenosis: Secondary | ICD-10-CM

## 2022-11-24 DIAGNOSIS — Z434 Encounter for attention to other artificial openings of digestive tract: Secondary | ICD-10-CM | POA: Diagnosis not present

## 2022-11-24 HISTORY — PX: IR EXCHANGE BILIARY DRAIN: IMG6046

## 2022-11-24 MED ORDER — LIDOCAINE HCL 1 % IJ SOLN
20.0000 mL | Freq: Once | INTRAMUSCULAR | Status: AC
  Administered 2022-11-24: 10 mL via INTRADERMAL

## 2022-11-24 MED ORDER — FENTANYL CITRATE (PF) 100 MCG/2ML IJ SOLN
INTRAMUSCULAR | Status: AC | PRN
  Administered 2022-11-24: 50 ug via INTRAVENOUS

## 2022-11-24 MED ORDER — SODIUM CHLORIDE 0.9 % IV SOLN: INTRAVENOUS | Status: DC

## 2022-11-24 MED ORDER — MIDAZOLAM HCL 2 MG/2ML IJ SOLN
INTRAMUSCULAR | Status: AC | PRN
  Administered 2022-11-24: 1 mg via INTRAVENOUS

## 2022-11-24 MED ORDER — MIDAZOLAM HCL 2 MG/2ML IJ SOLN
INTRAMUSCULAR | Status: AC
  Filled 2022-11-24: qty 2

## 2022-11-24 MED ORDER — SODIUM CHLORIDE 0.9% FLUSH: 5.0000 mL | Freq: Three times a day (TID) | INTRAVENOUS | Status: DC

## 2022-11-24 MED ORDER — SODIUM CHLORIDE 0.9 % IV SOLN
2.0000 g | INTRAVENOUS | Status: AC
  Administered 2022-11-24: 2 g via INTRAVENOUS
  Filled 2022-11-24: qty 20

## 2022-11-24 MED ORDER — FENTANYL CITRATE (PF) 100 MCG/2ML IJ SOLN
INTRAMUSCULAR | Status: AC
  Filled 2022-11-24: qty 2

## 2022-11-24 MED ORDER — IOHEXOL 300 MG/ML  SOLN
50.0000 mL | Freq: Once | INTRAMUSCULAR | Status: AC | PRN
  Administered 2022-11-24: 10 mL

## 2022-11-24 MED ORDER — LIDOCAINE HCL 1 % IJ SOLN
INTRAMUSCULAR | Status: AC
  Filled 2022-11-24: qty 20

## 2022-11-24 NOTE — H&P (Signed)
Chief Complaint: Patient was seen in consultation today for biliary obstruction  Referring Physician(s): Bennie Dallas  Supervising Physician: Roanna Banning  Patient Status: [facility] - Out-pt  History of Present Illness: Diamond Hinton is a 67 y.o. female  with PMH significant for anemia, anxiety, breast cancer, gallstones, and hypertension being seen today for image-guided biliary drain exchange under moderate sedation with possible new image-guided biliary drain placement. She underwent placement of right-sided internal/external biliary drainage catheter 07/17/22. Upon discharge from [facility], drain was capped and pt was provided with gravity bag. At home, she developed low-grade fever and malaise. She connected to gravity bag and then recapped a few days later. She presented to IR 08/17/22 for evaluation of drain that revealed drain and biliary stent occlusion. She subsequently underwent image-guided cholangioplasty with biliary drain exchange. Patient most recently underwent routine exchange on 10/13/22.  Patient presents today for routine biliary drain exchange.  Past Medical History:  Diagnosis Date   Anemia    Anxiety    Breast cancer (HCC)    Gallstones    GERD (gastroesophageal reflux disease)    Hyperlipidemia    Hypertension    Jaundice 08/29/2021   Rash 08/29/2021   Right upper quadrant abdominal pain 08/29/2021   UTI (urinary tract infection) 08/29/2021    Past Surgical History:  Procedure Laterality Date   BILATERAL SALPINGECTOMY  04/03/1997   CHOLECYSTECTOMY N/A 07/07/2015   Procedure: LAPAROSCOPIC CHOLECYSTECTOMY ;  Surgeon: Leafy Ro, MD;  Location: [facility] ORS;  Service: General;  Laterality: N/A;   ESOPHAGOGASTRODUODENOSCOPY  08/30/2021   Procedure: ESOPHAGOGASTRODUODENOSCOPY (EGD);  Surgeon: Midge Minium, MD;  Location: [facility] ENDOSCOPY;  Service: Endoscopy;;   GALLBLADDER SURGERY  07/07/2015   [facility]   IR BILIARY DILITATION  08/22/2022   IR BILIARY DRAIN PLACEMENT WITH  CHOLANGIOGRAM  08/31/2021   IR BILIARY STENT(S) NEW ACCESS WITH DRAIN  02/08/2022   IR CHOLANGIOGRAM EXISTING TUBE  08/17/2022   IR CONVERT BILIARY DRAIN TO INT EXT BILIARY DRAIN  09/02/2021   IR EXCHANGE BILIARY DRAIN  10/10/2021   IR EXCHANGE BILIARY DRAIN  12/05/2021   IR EXCHANGE BILIARY DRAIN  01/19/2022   IR EXCHANGE BILIARY DRAIN  10/13/2022   IR INT EXT BILIARY DRAIN WITH CHOLANGIOGRAM  07/17/2022   IR RADIOLOGIST EVAL & MGMT  03/01/2022   IR RADIOLOGIST EVAL & MGMT  08/17/2022    Allergies: Peanuts [peanut oil] and Flexeril [cyclobenzaprine]  Medications: Prior to Admission medications   Medication Sig Start Date End Date Taking? Authorizing Provider  acetaminophen (TYLENOL) 325 MG tablet Take 325 mg by mouth every 6 (six) hours as needed (for pain and sometimes takes 2 if pain is worse).    [provider]  baclofen (LIORESAL) 10 MG tablet Take 10 mg by mouth 3 (three) times daily as needed.    [provider]  letrozole (FEMARA) 2.5 MG tablet Take 1 tablet (2.5 mg total) by mouth daily. 11/02/22   Creig Hines, MD  levothyroxine (SYNTHROID) 88 MCG tablet Take 1 tablet by mouth once daily 09/26/22   Erasmo Downer, MD  Multiple Vitamins-Minerals (CENTRUM VITAMINTS PO) Take 1 tablet by mouth daily.    [provider]  ondansetron (ZOFRAN) 4 MG tablet Take 1 tablet (4 mg total) by mouth daily as needed for nausea or vomiting. 11/22/22 11/22/23  Creig Hines, MD  oxyCODONE (ROXICODONE) 5 MG immediate release tablet Take 1 tablet (5 mg total) by mouth every 4 (four) hours as needed for severe  pain. 07/24/22   Bacigalupo, Marzella Schlein, MD  palbociclib Ilda Foil) 75 MG tablet Take 1 tablet (75 mg total) by mouth daily. Take for 21 days on, 7 days off, repeat every 28 days. 10/31/22   Creig Hines, MD  pantoprazole (PROTONIX) 40 MG tablet Take 40 mg by mouth daily.    [provider]  prochlorperazine (COMPAZINE) 5 MG tablet Take 1 tablet (5 mg total) by mouth  every 6 (six) hours as needed for refractory nausea / vomiting. 02/14/22   Creig Hines, MD  traZODone (DESYREL) 50 MG tablet Take 50 mg by mouth at bedtime.    [provider]     Family History  Problem Relation Age of Onset   Hypertension Mother    CVA Mother    Ulcers Mother    Emphysema Mother    Heart disease Father    Prostate cancer Father    Macular degeneration Father    Hypertension Sister    Diabetes Sister    Cervical polyp Sister    Hypertension Sister    Diabetes Sister    Lymphoma Sister     Social History   Socioeconomic History   Marital status: Married    Spouse name: [name]   Number of children: 4   Years of education: Not on file   Highest education level: Not on file  Occupational History   Not on file  Tobacco Use   Smoking status: Never   Smokeless tobacco: Never  Vaping Use   Vaping Use: Never used  Substance and Sexual Activity   Alcohol use: No   Drug use: No   Sexual activity: Yes  Other Topics Concern   Not on file  Social History Narrative   Lives with spouse at home: [name] has Parkinson's    Social Determinants of Health   Financial Resource Strain: Low Risk  (11/24/2021)   Overall Financial Resource Strain (CARDIA)    Difficulty of Paying Living Expenses: Not hard at all  Food Insecurity: No Food Insecurity (11/10/2022)   Hunger Vital Sign    Worried About Running Out of Food in the Last Year: Never true    Ran Out of Food in the Last Year: Never true  Transportation Needs: No Transportation Needs (11/10/2022)   PRAPARE - Administrator, Civil Service (Medical): No    Lack of Transportation (Non-Medical): No  Physical Activity: Not on file  Stress: No Stress Concern Present (11/24/2021)   [employer] of Occupational Health - Occupational Stress Questionnaire    Feeling of Stress : Only a little  Social Connections: Unknown (11/24/2021)   Social Connection and Isolation Panel [NHANES]    Frequency of  Communication with Friends and Family: Three times a week    Frequency of Social Gatherings with Friends and Family: Three times a week    Attends Religious Services: Patient declined    Active Member of Clubs or Organizations: Patient declined    Attends Banker Meetings: Patient declined    Marital Status: Married    Code Status: DNR- Patient states that she does not want chest compressions nor intubation in the event of cardiac or respiratory rest. She is ok with receiving IV medication and comfort medications  Review of Systems: A 12 point ROS discussed and pertinent positives are indicated in the HPI above.  All other systems are negative.  Review of Systems  Constitutional:  Negative for chills and fever.  Respiratory:  Negative for chest  tightness and shortness of breath.   Cardiovascular:  Negative for chest pain and leg swelling.  Gastrointestinal:  Positive for abdominal pain, constipation and nausea. Negative for diarrhea and vomiting.  Neurological:  Negative for dizziness and headaches.  Psychiatric/Behavioral:  Negative for confusion.     Vital Signs: BP 112/68   Pulse 83   Temp 98.1 F (36.7 C) (Oral)   Resp 20   Ht 5\' 3"  (1.6 m)   Wt 130 lb (59 kg)   SpO2 98%   BMI 23.03 kg/m   Advance Care Plan: The advanced care plan/surrogate decision maker was discussed at the time of visit and documented in the medical record.  Patient's daughter, Sara Chu, is reported by the patient as her HCPOA. Victorino Dike is present at the hospital today.  Physical Exam Vitals reviewed.  Constitutional:      General: She is not in acute distress.    Appearance: She is ill-appearing.  HENT:     Mouth/Throat:     Mouth: Mucous membranes are moist.  Cardiovascular:     Rate and Rhythm: Normal rate and regular rhythm.     Pulses: Normal pulses.     Heart sounds: Normal heart sounds.  Pulmonary:     Effort: Pulmonary effort is normal.     Breath sounds: Normal  breath sounds.  Abdominal:     Palpations: Abdomen is soft.     Tenderness: There is no abdominal tenderness.  Musculoskeletal:     Right lower leg: No edema.     Left lower leg: No edema.  Skin:    General: Skin is warm and dry.  Neurological:     Mental Status: She is alert and oriented to person, place, and time.  Psychiatric:        Mood and Affect: Mood normal.        Behavior: Behavior normal.        Thought Content: Thought content normal.        Judgment: Judgment normal.     Imaging: CT CHEST ABDOMEN PELVIS W CONTRAST  Result Date: 11/06/2022 CLINICAL DATA:  Metastatic cancer with biliary drain that is nonfunctioning. EXAM: CT CHEST, ABDOMEN, AND PELVIS WITH CONTRAST TECHNIQUE: Multidetector CT imaging of the chest, abdomen and pelvis was performed following the standard protocol during bolus administration of intravenous contrast. RADIATION DOSE REDUCTION: This exam was performed according to the departmental dose-optimization program which includes automated exposure control, adjustment of the mA and/or kV according to patient size and/or use of iterative reconstruction technique. CONTRAST:  OMNIPAQUE IOHEXOL 300 MG/ML  SOLN COMPARISON:  07/16/2022 FINDINGS: CT CHEST FINDINGS Cardiovascular: Normal heart size. No pericardial effusion. Atheromatous calcification of the aorta and coronaries. Mediastinum/Nodes: Negative for mass or adenopathy Lungs/Pleura: Linear opacity at the lung bases greater on the right, attributed atelectasis. There is no edema, consolidation, effusion, or pneumothorax. Musculoskeletal: No acute finding. Spinal and right shoulder degeneration CT ABDOMEN PELVIS FINDINGS Hepatobiliary: Percutaneous biliary drain with internalization. The tip is at the duodenum. There has been extension of metallic stenting into the right biliary tree. Biliary dilatation measured at the CBD is 2 cm, stable. Pneumobilia is seen in the ventral and upper liver, nonspecific in the  setting of flushing. Drain holes appear encompassed by the biliary system and bowel lumen. Pancreas: Chronic ductal dilatation.  No acute inflammation. Spleen: Negative Adrenals/Urinary Tract: Small right renal calculus. Normal renal enhancement. Normal appearance of the adrenal glands. Stomach/Bowel: Low-density at the pancreaticoduodenal groove which is stable. No  bowel obstruction. Vascular/Lymphatic: Atheromatous calcification affecting the aorta and branch vessels. No acute finding Reproductive: Unremarkable Other: No ascites or pneumoperitoneum Musculoskeletal: Ordinary degeneration of the lumbar spine. IMPRESSION: Normally located internalized biliary drain. Unremarkable positioning of metallic biliary stenting which has been extended since 07/16/2022. Persisting biliary dilatation to the level of the distal CBD. Persisting pancreatic ductal dilatation. Atelectasis at the lung bases. Electronically Signed   By: Tiburcio Pea M.D.   On: 11/06/2022 07:49   DG Chest Port 1 View  Result Date: 11/06/2022 CLINICAL DATA:  67 year old female with possible sepsis. EXAM: PORTABLE CHEST 1 VIEW COMPARISON:  Chest x-ray 11/01/2021. FINDINGS: Lung volumes are slightly low. Irregular bibasilar opacities may reflect areas of atelectasis and/or consolidation. No definite pleural effusions. No pneumothorax. No evidence of pulmonary edema. Heart size is normal. Upper mediastinal contours are within normal limits. Atherosclerotic calcifications are noted in the thoracic aorta. IMPRESSION: 1. Low lung volumes with bibasilar areas of atelectasis and/or consolidation. 2. Aortic atherosclerosis. Electronically Signed   By: Trudie Reed M.D.   On: 11/06/2022 05:59    Labs:  CBC: Recent Labs    10/13/22 1313 10/24/22 1302 11/06/22 0520 11/22/22 1044  WBC 6.4 7.0 9.7 10.3  HGB 13.2 13.6 13.7 13.5  HCT 38.3 39.8 40.2 39.8  PLT 248 326 220 467*    COAGS: Recent Labs    02/08/22 0918 08/22/22 1125  10/13/22 1313 11/06/22 0520  INR 1.3* 1.1 1.2 1.2  APTT  --   --   --  28    BMP: Recent Labs    10/13/22 1313 10/24/22 1302 11/06/22 0520 11/22/22 1044  NA 138 134* 134* 130*  K 3.4* 4.1 3.8 4.3  CL 109 103 109 99  CO2 19* 24 19* 20*  GLUCOSE 116* 160* 179* 150*  BUN 21 20 19  30*  CALCIUM 9.7 9.3 9.3 9.2  CREATININE 1.05* 1.24* 0.98 1.21*  GFRNONAA 58* 47* >60 49*    LIVER FUNCTION TESTS: Recent Labs    10/13/22 1313 10/24/22 1302 11/06/22 0520 11/22/22 1044  BILITOT 0.7 0.3 1.6* 1.1  AST 34 38 124* 40  ALT 38 58* 126* 51*  ALKPHOS 60 92 98 277*  PROT 7.7 7.9 7.6 8.4*  ALBUMIN 4.3 4.3 4.0 4.1    TUMOR MARKERS: No results for input(s): "AFPTM", "CEA", "CA199", "CHROMGRNA" in the last 8760 hours.  Assessment and Plan:  Diamond Hinton is a 66 yo female being seen today in relation to a biliary obstruction with existing biliary drain in place. Patient presents for routine exchange of her drain today. She presents today in her usual state of health and is NPO. Case has been reviewed with Dr Milford Cage and is scheduled to proceed on 11/24/22.   Risks and benefits of image-guided biliary drain exchange were discussed with the patient including, but not limited to bleeding, infection which may lead to sepsis or even death and damage to adjacent structures.   This interventional procedure involves the use of X-rays and because of the nature of the planned procedure, it is possible that we will have prolonged use of X-ray fluoroscopy.   Potential radiation risks to you include (but are not limited to) the following: - A slightly elevated risk for cancer      several years later in life. This risk is typically less than 0.5% percent. This risk is low in comparison to the normal incidence of human cancer, which is 33% for women and 50% for men according to the American  Cancer Society. - Radiation induced injury can include skin redness, resembling a rash, tissue breakdown /  ulcers and hair loss (which can be temporary or permanent).    The likelihood of either of these occurring depends on the difficulty of the procedure and whether you are sensitive to radiation due to previous procedures, disease, or genetic conditions.    IF your procedure requires a prolonged use of radiation, you will be notified and given written instructions for further action.  It is your responsibility to monitor the irradiated area for the 2 weeks following the procedure and to notify your physician if you are concerned that you have suffered a radiation induced injury.     All of the patient's questions were answered, patient is agreeable to proceed.   Consent signed and in chart.    Thank you for this interesting consult.  I greatly enjoyed meeting Diamond Hinton and look forward to participating in their care.  A copy of this report was sent to the requesting provider on this date.  Electronically Signed: Kennieth Francois, PA-C 11/24/2022, 10:47 AM   I spent a total of  25 Minutes in face to face in clinical consultation, greater than 50% of which was counseling/coordinating care for biliary obstruction.

## 2022-11-24 NOTE — Procedures (Signed)
Vascular and Interventional Radiology Procedure Note  Patient: Diamond Hinton DOB: June 13, 1955 Medical Record Number: [id number] Note Date/Time: 11/24/22 10:52 AM   Performing Physician: Roanna Banning, MD Assistant(s): None  Diagnosis: Hx of malignant biliary obstruction.   Procedure:  BILIARY DRAINAGE TUBE EXCHANGE ANTEROGRADE CHOLANGIOGRAM  Anesthesia: Conscious Sedation Complications: None Estimated Blood Loss: Minimal Specimens:  None  Findings:  Successful exchange for a new 73F PTBD. Patent biliary stent.  Plan: Flush tube w 5 mL sterile NS q8h and record drain output qShift. Follow up for routine tube evaluation in 9-12 week(s).   See detailed procedure note with images in PACS. The patient tolerated the procedure well without incident or complication and was returned to Recovery in stable condition.    Roanna Banning, MD [facility]   Pager. [phone] Clinic. [phone]

## 2022-11-30 ENCOUNTER — Other Ambulatory Visit (HOSPITAL_COMMUNITY): Payer: Self-pay

## 2022-11-30 ENCOUNTER — Other Ambulatory Visit: Payer: Self-pay | Admitting: Oncology

## 2022-11-30 ENCOUNTER — Other Ambulatory Visit: Payer: Self-pay

## 2022-11-30 DIAGNOSIS — C784 Secondary malignant neoplasm of small intestine: Secondary | ICD-10-CM

## 2022-11-30 MED ORDER — PALBOCICLIB 75 MG PO TABS
75.0000 mg | ORAL_TABLET | Freq: Every day | ORAL | 0 refills | Status: DC
Start: 2022-11-30 — End: 2022-12-28
  Filled 2022-11-30: qty 21, 28d supply, fill #0

## 2022-12-05 ENCOUNTER — Other Ambulatory Visit: Payer: Medicare HMO

## 2022-12-05 ENCOUNTER — Other Ambulatory Visit (HOSPITAL_COMMUNITY): Payer: Self-pay

## 2022-12-11 ENCOUNTER — Telehealth: Payer: Self-pay | Admitting: Physician Assistant

## 2022-12-11 NOTE — Telephone Encounter (Signed)
  Patient called and stated she has had only minimal bilious output from her biliary drain in the past few days.  She does state it flushes easily. She denies any abdominal pain.  I explained to her that it is internalized and the bile is going down the "correct path" in the small bowel and as long as it flushes easily, she shouldn't be concerned.  She understands. She knows she can call us anytime she has a concern.  Jerry Caras Tison Leibold PA-C 12/11/2022 3:38 PM

## 2022-12-12 ENCOUNTER — Other Ambulatory Visit: Payer: Self-pay | Admitting: Interventional Radiology

## 2022-12-12 DIAGNOSIS — K831 Obstruction of bile duct: Secondary | ICD-10-CM

## 2022-12-19 ENCOUNTER — Inpatient Hospital Stay: Payer: Medicare HMO | Attending: Oncology

## 2022-12-19 DIAGNOSIS — C50912 Malignant neoplasm of unspecified site of left female breast: Secondary | ICD-10-CM

## 2022-12-19 DIAGNOSIS — C50919 Malignant neoplasm of unspecified site of unspecified female breast: Secondary | ICD-10-CM | POA: Insufficient documentation

## 2022-12-19 DIAGNOSIS — C784 Secondary malignant neoplasm of small intestine: Secondary | ICD-10-CM | POA: Insufficient documentation

## 2022-12-19 DIAGNOSIS — R7989 Other specified abnormal findings of blood chemistry: Secondary | ICD-10-CM

## 2022-12-19 LAB — CBC WITH DIFFERENTIAL/PLATELET
Abs Immature Granulocytes: 0.01 10*3/uL (ref 0.00–0.07)
Basophils Absolute: 0.1 10*3/uL (ref 0.0–0.1)
Basophils Relative: 1 %
Eosinophils Absolute: 0 10*3/uL (ref 0.0–0.5)
Eosinophils Relative: 1 %
HCT: 40.9 % (ref 36.0–46.0)
Hemoglobin: 13.1 g/dL (ref 12.0–15.0)
Immature Granulocytes: 0 %
Lymphocytes Relative: 29 %
Lymphs Abs: 1.5 10*3/uL (ref 0.7–4.0)
MCH: 30.8 pg (ref 26.0–34.0)
MCHC: 32 g/dL (ref 30.0–36.0)
MCV: 96 fL (ref 80.0–100.0)
Monocytes Absolute: 0.2 10*3/uL (ref 0.1–1.0)
Monocytes Relative: 4 %
Neutro Abs: 3.3 10*3/uL (ref 1.7–7.7)
Neutrophils Relative %: 65 %
Platelets: 372 10*3/uL (ref 150–400)
RBC: 4.26 MIL/uL (ref 3.87–5.11)
RDW: 13.6 % (ref 11.5–15.5)
WBC: 5.1 10*3/uL (ref 4.0–10.5)
nRBC: 0 % (ref 0.0–0.2)

## 2022-12-19 LAB — COMPREHENSIVE METABOLIC PANEL
ALT: 28 U/L (ref 0–44)
AST: 19 U/L (ref 15–41)
Albumin: 4.1 g/dL (ref 3.5–5.0)
Alkaline Phosphatase: 130 U/L — ABNORMAL HIGH (ref 38–126)
Anion gap: 10 (ref 5–15)
BUN: 24 mg/dL — ABNORMAL HIGH (ref 8–23)
CO2: 26 mmol/L (ref 22–32)
Calcium: 9.4 mg/dL (ref 8.9–10.3)
Chloride: 100 mmol/L (ref 98–111)
Creatinine, Ser: 1.02 mg/dL — ABNORMAL HIGH (ref 0.44–1.00)
GFR, Estimated: 60 mL/min — ABNORMAL LOW (ref >60)
Glucose, Bld: 150 mg/dL — ABNORMAL HIGH (ref 70–99)
Potassium: 4.1 mmol/L (ref 3.5–5.1)
Sodium: 136 mmol/L (ref 135–145)
Total Bilirubin: 0.5 mg/dL (ref 0.3–1.2)
Total Protein: 8.1 g/dL (ref 6.5–8.1)

## 2022-12-20 ENCOUNTER — Other Ambulatory Visit: Payer: Self-pay | Admitting: Family Medicine

## 2022-12-20 DIAGNOSIS — E039 Hypothyroidism, unspecified: Secondary | ICD-10-CM

## 2022-12-20 LAB — CANCER ANTIGEN 27.29: CA 27.29: 25.5 U/mL (ref 0.0–38.6)

## 2022-12-25 ENCOUNTER — Emergency Department: Payer: Medicare HMO

## 2022-12-25 ENCOUNTER — Other Ambulatory Visit: Payer: Self-pay

## 2022-12-25 ENCOUNTER — Emergency Department
Admission: EM | Admit: 2022-12-25 | Discharge: 2022-12-25 | Disposition: A | Discharge status: Home / Self Care | Payer: Medicare HMO | Attending: Emergency Medicine | Admitting: Emergency Medicine

## 2022-12-25 DIAGNOSIS — R519 Headache, unspecified: Secondary | ICD-10-CM | POA: Insufficient documentation

## 2022-12-25 DIAGNOSIS — R202 Paresthesia of skin: Secondary | ICD-10-CM | POA: Insufficient documentation

## 2022-12-25 DIAGNOSIS — I10 Essential (primary) hypertension: Secondary | ICD-10-CM | POA: Diagnosis not present

## 2022-12-25 DIAGNOSIS — R200 Anesthesia of skin: Secondary | ICD-10-CM | POA: Insufficient documentation

## 2022-12-25 DIAGNOSIS — Z853 Personal history of malignant neoplasm of breast: Secondary | ICD-10-CM | POA: Diagnosis not present

## 2022-12-25 DIAGNOSIS — E876 Hypokalemia: Secondary | ICD-10-CM | POA: Diagnosis not present

## 2022-12-25 DIAGNOSIS — R112 Nausea with vomiting, unspecified: Secondary | ICD-10-CM

## 2022-12-25 DIAGNOSIS — R110 Nausea: Secondary | ICD-10-CM | POA: Diagnosis not present

## 2022-12-25 DIAGNOSIS — I959 Hypotension, unspecified: Secondary | ICD-10-CM | POA: Diagnosis not present

## 2022-12-25 DIAGNOSIS — R42 Dizziness and giddiness: Secondary | ICD-10-CM | POA: Diagnosis not present

## 2022-12-25 DIAGNOSIS — E119 Type 2 diabetes mellitus without complications: Secondary | ICD-10-CM | POA: Diagnosis not present

## 2022-12-25 DIAGNOSIS — Z743 Need for continuous supervision: Secondary | ICD-10-CM | POA: Diagnosis not present

## 2022-12-25 DIAGNOSIS — E039 Hypothyroidism, unspecified: Secondary | ICD-10-CM | POA: Diagnosis not present

## 2022-12-25 DIAGNOSIS — N179 Acute kidney failure, unspecified: Secondary | ICD-10-CM | POA: Insufficient documentation

## 2022-12-25 LAB — MAGNESIUM: Magnesium: 1.6 mg/dL — ABNORMAL LOW (ref 1.7–2.4)

## 2022-12-25 LAB — CBC WITH DIFFERENTIAL/PLATELET
Abs Immature Granulocytes: 0.06 10*3/uL (ref 0.00–0.07)
Basophils Absolute: 0.1 10*3/uL (ref 0.0–0.1)
Basophils Relative: 1 %
Eosinophils Absolute: 0 10*3/uL (ref 0.0–0.5)
Eosinophils Relative: 0 %
HCT: 39.8 % (ref 36.0–46.0)
Hemoglobin: 13.6 g/dL (ref 12.0–15.0)
Immature Granulocytes: 1 %
Lymphocytes Relative: 23 %
Lymphs Abs: 2.1 10*3/uL (ref 0.7–4.0)
MCH: 31.7 pg (ref 26.0–34.0)
MCHC: 34.2 g/dL (ref 30.0–36.0)
MCV: 92.8 fL (ref 80.0–100.0)
Monocytes Absolute: 0.5 10*3/uL (ref 0.1–1.0)
Monocytes Relative: 6 %
Neutro Abs: 6.5 10*3/uL (ref 1.7–7.7)
Neutrophils Relative %: 69 %
Platelets: 406 10*3/uL — ABNORMAL HIGH (ref 150–400)
RBC: 4.29 MIL/uL (ref 3.87–5.11)
RDW: 13.7 % (ref 11.5–15.5)
WBC: 9.2 10*3/uL (ref 4.0–10.5)
nRBC: 0 % (ref 0.0–0.2)

## 2022-12-25 LAB — COMPREHENSIVE METABOLIC PANEL
ALT: 24 U/L (ref 0–44)
AST: 24 U/L (ref 15–41)
Albumin: 4 g/dL (ref 3.5–5.0)
Alkaline Phosphatase: 94 U/L (ref 38–126)
Anion gap: 10 (ref 5–15)
BUN: 39 mg/dL — ABNORMAL HIGH (ref 8–23)
CO2: 16 mmol/L — ABNORMAL LOW (ref 22–32)
Calcium: 8.2 mg/dL — ABNORMAL LOW (ref 8.9–10.3)
Chloride: 106 mmol/L (ref 98–111)
Creatinine, Ser: 1.39 mg/dL — ABNORMAL HIGH (ref 0.44–1.00)
GFR, Estimated: 41 mL/min — ABNORMAL LOW (ref >60)
Glucose, Bld: 228 mg/dL — ABNORMAL HIGH (ref 70–99)
Potassium: 3.3 mmol/L — ABNORMAL LOW (ref 3.5–5.1)
Sodium: 132 mmol/L — ABNORMAL LOW (ref 135–145)
Total Bilirubin: 0.7 mg/dL (ref 0.3–1.2)
Total Protein: 7.4 g/dL (ref 6.5–8.1)

## 2022-12-25 MED ORDER — POTASSIUM CHLORIDE CRYS ER 20 MEQ PO TBCR
40.0000 meq | EXTENDED_RELEASE_TABLET | Freq: Once | ORAL | Status: AC
  Administered 2022-12-25: 40 meq via ORAL
  Filled 2022-12-25: qty 2

## 2022-12-25 MED ORDER — MAGNESIUM SULFATE 2 GM/50ML IV SOLN
2.0000 g | Freq: Once | INTRAVENOUS | Status: AC
  Administered 2022-12-25: 2 g via INTRAVENOUS
  Filled 2022-12-25: qty 50

## 2022-12-25 MED ORDER — DIPHENHYDRAMINE HCL 50 MG/ML IJ SOLN
25.0000 mg | Freq: Once | INTRAMUSCULAR | Status: AC
  Administered 2022-12-25: 25 mg via INTRAVENOUS
  Filled 2022-12-25: qty 1

## 2022-12-25 MED ORDER — LACTATED RINGERS IV BOLUS
1000.0000 mL | Freq: Once | INTRAVENOUS | Status: AC
  Administered 2022-12-25: 1000 mL via INTRAVENOUS

## 2022-12-25 MED ORDER — PROCHLORPERAZINE EDISYLATE 10 MG/2ML IJ SOLN
10.0000 mg | Freq: Once | INTRAMUSCULAR | Status: AC
  Administered 2022-12-25: 10 mg via INTRAVENOUS
  Filled 2022-12-25: qty 2

## 2022-12-25 NOTE — ED Triage Notes (Signed)
Pt comes in from home via [facility] with complaints of generalized numbness and tingly. According to EMS the patient is currently taking oral chemotherapy from left sided breast cancer. Pt complains of trembling, generalized body cramps, light headedness and vomiting. According to EMS, pt 's bp dropped when pt went to stand with them. Pt is alert and oriented x4, and with no signs of distress at this time.

## 2022-12-25 NOTE — ED Provider Notes (Signed)
[facility] Provider Note    Event Date/Time   First MD Initiated Contact with Patient 12/25/22 1837     (approximate)   History   Chief Complaint Numbness (t) and Tingling   HPI  Diamond Hinton is a 67 y.o. 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  Patient reports that around 330 this afternoon, about 3 hours prior to arrival, she had sudden onset of pain behind both of her eyes.  This was followed by numbness and tingling extending down throughout her entire body.  She reports feeling weak all over for the past couple of days with no focal weakness noted with the onset of headache and tingling.  She has not had any fevers and denies any cough, chest pain, shortness of breath, abdominal pain, or dysuria.  She does state that she felt nauseous with the onset of symptoms and vomited once, received Zofran with EMS with improvement in nausea.     Physical Exam   Triage Vital Signs: ED Triage Vitals  Encounter Vitals Group     BP 12/25/22 1832 127/87     Systolic BP Percentile --      Diastolic BP Percentile --      Pulse Rate 12/25/22 1832 89     Resp 12/25/22 1832 18     Temp --      Temp src --      SpO2 12/25/22 1828 95 %     Weight 12/25/22 1830 133 lb (60.3 kg)     Height 12/25/22 1830 5\' 3"  (1.6 m)     Head Circumference --      Peak Flow --      Pain Score 12/25/22 1830 5     Pain Loc --      Pain Education --      Exclude from Growth Chart --     Most recent vital signs: Vitals:   12/25/22 2100 12/25/22 2200  BP: 117/64 110/67  Pulse: 83 78  Resp: 17 20  SpO2: 96% 97%    Constitutional: Alert and oriented. Eyes: Conjunctivae are normal.  Pupils equal, round, and reactive to light bilaterally. Head: Atraumatic. Nose: No congestion/rhinnorhea. Mouth/Throat: Mucous  membranes are moist.  Neck: Supple with no meningismus. Cardiovascular: Normal rate, regular rhythm. Grossly normal heart sounds.  2+ radial pulses bilaterally. Respiratory: Normal respiratory effort.  No retractions. Lungs CTAB. Gastrointestinal: Soft and nontender. No distention.  Biliary drain site clean, dry, and intact with bilious fluid in collecting bag. Musculoskeletal: No lower extremity tenderness nor edema.  Neurologic:  Normal speech and language. No gross focal neurologic deficits are appreciated.    ED Results / Procedures / Treatments   Labs (all labs ordered are listed, but only abnormal results are displayed) Labs Reviewed  CBC WITH DIFFERENTIAL/PLATELET - Abnormal; Notable for the following components:      Result Value   Platelets 406 (*)    All other components within normal limits  COMPREHENSIVE METABOLIC PANEL - Abnormal; Notable for the following components:   Sodium 132 (*)    Potassium 3.3 (*)    CO2 16 (*)    Glucose, Bld 228 (*)    BUN 39 (*)    Creatinine, Ser 1.39 (*)    Calcium 8.2 (*)    GFR, Estimated 41 (*)    All other components  within normal limits  MAGNESIUM - Abnormal; Notable for the following components:   Magnesium 1.6 (*)    All other components within normal limits  URINALYSIS, ROUTINE W REFLEX MICROSCOPIC     EKG  ED ECG REPORT I, Chesley Noon, the attending physician, personally viewed and interpreted this ECG.   Date: 12/25/2022  EKG Time: 22:09  Rate: 74  Rhythm: normal sinus rhythm  Axis: Normal  Intervals:none  ST&T Change: None  RADIOLOGY CT head reviewed and interpreted by me with no hemorrhage or midline shift.  PROCEDURES:  Critical Care performed: No  Procedures   MEDICATIONS ORDERED IN ED: Medications  lactated ringers bolus 1,000 mL (0 mLs Intravenous Stopped 12/25/22 2011)  prochlorperazine (COMPAZINE) injection 10 mg (10 mg Intravenous Given 12/25/22 1851)  diphenhydrAMINE (BENADRYL) injection 25  mg (25 mg Intravenous Given 12/25/22 1852)  magnesium sulfate IVPB 2 g 50 mL (0 g Intravenous Stopped 12/25/22 2116)  potassium chloride SA (KLOR-CON M) CR tablet 40 mEq (40 mEq Oral Given 12/25/22 2020)     IMPRESSION / MDM / ASSESSMENT AND PLAN / ED COURSE  I reviewed the triage vital signs and the nursing notes.                              68 y.o. 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  Patient's presentation is most consistent with acute presentation with potential threat to life or bodily function.  Differential diagnosis includes, but is not limited to, SAH, meningitis, tension headache, migraine headache, electrolyte abnormality, AKI, dehydration, anemia, biliary obstruction, sepsis, UTI.  Patient nontoxic-appearing and in no acute distress, vital signs are unremarkable.  She reports sudden onset severe headache and we will check CT head, but she has no focal neurologic deficits on exam.  She does report decreased oral intake recently with some cramping, some of her symptoms may be due to electrolyte abnormality and labs are pending at this time.  She was apparently orthostatic with EMS and we will give IV fluid bolus, treat symptomatically with IV Compazine and Benadryl.  Biliary drain appears to be functioning normally and she has no abdominal tenderness on exam.  No symptoms to suggest cardiac etiology.  CT head is negative for acute process, labs with mild AKI along with hypomagnesemia and hypokalemia.  Electrolytes were repleted and patient hydrated with IV fluids.  Remainder of labs are reassuring with no significant anemia, leukocytosis, or LFT abnormality.  Patient has been unable to provide urine sample but low  suspicion for UTI as she denies any urinary symptoms.  She does state her headache is significantly improved following migraine cocktail, nausea resolved and she is tolerating oral intake without difficulty.  Patient able to ambulate without difficulty and is appropriate for outpatient management, was counseled to follow-up with her PCP for repeat labs in about 1 week.  She was counseled to return to the ED for new or worsening symptoms, patient states she has plenty of nausea medication available at home.      FINAL CLINICAL IMPRESSION(S) / ED DIAGNOSES   Final diagnoses:  Acute nonintractable headache, unspecified headache type  Nausea and vomiting, unspecified vomiting type  Paresthesia  AKI (acute kidney injury) (  HCC)     Rx / DC Orders   ED Discharge Orders     None        Note:  This document was prepared using Dragon voice recognition software and may include unintentional dictation errors.   Chesley Noon, MD 12/25/22 2213

## 2022-12-25 NOTE — ED Notes (Signed)
Pt ambulated independently in room, no issues, stated she "felt good."

## 2022-12-25 NOTE — ED Notes (Signed)
 Provided water

## 2022-12-27 ENCOUNTER — Other Ambulatory Visit: Payer: Self-pay | Admitting: Family Medicine

## 2022-12-27 NOTE — Telephone Encounter (Signed)
Requested medication (s) are due for refill today: yes  Requested medication (s) are on the active medication list: yes  Last refill:  08/17/22  Future visit scheduled: yes  Notes to clinic:  Unable to refill per protocol, last refill by historical provider.  Routing for approval.     Requested Prescriptions  Pending Prescriptions Disp Refills   pantoprazole (PROTONIX) 40 MG tablet      Sig: Take 1 tablet (40 mg total) by mouth daily.     Gastroenterology: Proton Pump Inhibitors Passed - 12/27/2022 11:02 AM      Passed - Valid encounter within last 12 months    Recent Outpatient Visits           2 months ago Type 2 diabetes mellitus with other specified complication, without long-term current use of insulin [facility])   [place] [facility] [place], Marzella Schlein, MD   4 months ago Hematuria, unspecified type   [place] [facility] [place], Marzella Schlein, MD   5 months ago Biliary stent obstruction, initial encounter   [place] [facility] [place], Marzella Schlein, MD   8 months ago Palpitations   [place] [facility] [place], Marzella Schlein, MD   8 months ago Encounter for subsequent annual wellness visit (AWV) in Medicare patient   [facility] [place], Marzella Schlein, MD       Future Appointments             In 3 months [place], Marzella Schlein, MD [facility]

## 2022-12-27 NOTE — Telephone Encounter (Signed)
Copied from CRM [id number]. Topic: General - Other >> Dec 27, 2022 10:04 AM Everette C wrote: Reason for CRM: Medication Refill - Medication: pantoprazole (PROTONIX) 40 MG tablet [[id number]]  Has the patient contacted their pharmacy? Yes.   (Agent: If no, request that the patient contact the pharmacy for the refill. If patient does not wish to contact the pharmacy document the reason why and proceed with request.) (Agent: If yes, when and what did the pharmacy advise?)  Preferred Pharmacy (with phone number or street name): [facility] [address] [place] - [address] [place] [address] Phone: [phone] Fax: [phone] Hours: Not open 24 hours   Has the patient been seen for an appointment in the last year OR does the patient have an upcoming appointment? Yes.    Agent: Please be advised that RX refills may take up to 3 business days. We ask that you follow-up with your pharmacy.

## 2022-12-28 ENCOUNTER — Other Ambulatory Visit (HOSPITAL_COMMUNITY): Payer: Self-pay

## 2022-12-28 ENCOUNTER — Other Ambulatory Visit: Payer: Self-pay | Admitting: Nurse Practitioner

## 2022-12-28 DIAGNOSIS — C50912 Malignant neoplasm of unspecified site of left female breast: Secondary | ICD-10-CM

## 2022-12-29 ENCOUNTER — Other Ambulatory Visit (HOSPITAL_COMMUNITY): Payer: Self-pay

## 2022-12-29 ENCOUNTER — Other Ambulatory Visit: Payer: Self-pay

## 2022-12-29 MED ORDER — PALBOCICLIB 75 MG PO TABS
75.0000 mg | ORAL_TABLET | Freq: Every day | ORAL | 0 refills | Status: DC
Start: 2022-12-29 — End: 2023-01-26
  Filled 2022-12-29: qty 21, 28d supply, fill #0

## 2023-01-01 ENCOUNTER — Other Ambulatory Visit (HOSPITAL_COMMUNITY): Payer: Self-pay

## 2023-01-03 NOTE — Telephone Encounter (Signed)
Pt. Requesting refill on Protonix, requested last week. Has 2 pills left. Please advise pt.

## 2023-01-04 ENCOUNTER — Ambulatory Visit (INDEPENDENT_AMBULATORY_CARE_PROVIDER_SITE_OTHER): Payer: Medicare HMO | Admitting: Family Medicine

## 2023-01-04 ENCOUNTER — Encounter: Payer: Self-pay | Admitting: Family Medicine

## 2023-01-04 VITALS — BP 132/82 | HR 85 | Temp 99.2°F | Ht 62.99 in | Wt 133.6 lb

## 2023-01-04 DIAGNOSIS — J0140 Acute pansinusitis, unspecified: Secondary | ICD-10-CM | POA: Diagnosis not present

## 2023-01-04 DIAGNOSIS — E871 Hypo-osmolality and hyponatremia: Secondary | ICD-10-CM | POA: Diagnosis not present

## 2023-01-04 DIAGNOSIS — E876 Hypokalemia: Secondary | ICD-10-CM

## 2023-01-04 DIAGNOSIS — K2960 Other gastritis without bleeding: Secondary | ICD-10-CM

## 2023-01-04 MED ORDER — AMOXICILLIN-POT CLAVULANATE 875-125 MG PO TABS: 1.0000 | ORAL_TABLET | Freq: Two times a day (BID) | ORAL | 0 refills | Status: AC

## 2023-01-04 MED ORDER — PANTOPRAZOLE SODIUM 40 MG PO TBEC: 40.0000 mg | DELAYED_RELEASE_TABLET | Freq: Every day | ORAL | 1 refills | Status: DC

## 2023-01-04 NOTE — Progress Notes (Signed)
Established Patient Office Visit  Subjective   Patient ID: Diamond Hinton, female    DOB: 1955/12/02  Age: 67 y.o. MRN: [id number]  Chief Complaint  Patient presents with   sinus problems    Started on July 15, was seen in ER, Patient states that her nose feels on fire if the [facility] is too cold    HPI  Discussed the use of AI scribe software for clinical note transcription with the patient, who gave verbal consent to proceed.  History of Present Illness   The patient, with a history of sinus headaches in her 78s, presents with a 10-day history of sinus headaches and nasal burning. The symptoms began with severe eye pain, which resolved and was followed by a frontal headache. The headache has been persistent, with some relief from ibuprofen and Sudafed. The nasal passages feel dry and burn, described as 'like there was acid.' Despite the discomfort, the patient denies nasal congestion or drainage. The patient has tried steam inhalation and saline solution with minimal relief. The patient also reports sensitivity to cold air, which exacerbates the headache. She has also experienced generalized aches and a low-grade fever, which has since resolved. The patient denies a sore throat and swollen glands.         ROS per HPI    Objective:     BP 132/82   Pulse 85   Temp 99.2 F (37.3 C) (Oral)   Ht 5' 2.99" (1.6 m)   Wt 133 lb 9.6 oz (60.6 kg)   SpO2 100%   BMI 23.67 kg/m    Physical Exam Vitals reviewed.  Constitutional:      General: She is not in acute distress.    Appearance: Normal appearance. She is well-developed. She is not diaphoretic.  HENT:     Head: Normocephalic and atraumatic.     Right Ear: Tympanic membrane, ear canal and external ear normal.     Left Ear: Tympanic membrane, ear canal and external ear normal.     Nose: Nose normal.     Mouth/Throat:     Mouth: Mucous membranes are moist.     Pharynx: Oropharynx is clear. No oropharyngeal exudate.  Eyes:      General: No scleral icterus.    Conjunctiva/sclera: Conjunctivae normal.     Pupils: Pupils are equal, round, and reactive to light.  Cardiovascular:     Rate and Rhythm: Normal rate and regular rhythm.     Heart sounds: Normal heart sounds. No murmur heard. Pulmonary:     Effort: Pulmonary effort is normal. No respiratory distress.     Breath sounds: Normal breath sounds. No wheezing or rales.  Musculoskeletal:     Cervical back: Neck supple.     Right lower leg: No edema.     Left lower leg: No edema.  Lymphadenopathy:     Cervical: No cervical adenopathy.  Skin:    General: Skin is warm and dry.     Findings: No rash.  Neurological:     Mental Status: She is alert.      No results found for any visits on 01/04/23.    The 10-year ASCVD risk score (Arnett DK, et al., 2019) is: 16%    Assessment & Plan:   Problem List Items Addressed This Visit       Digestive   Reflux gastritis     Other   Hypokalemia   Relevant Orders   Basic Metabolic Panel (BMET)   Other Visit  Diagnoses     Acute non-recurrent pansinusitis    -  Primary   Relevant Medications   amoxicillin-clavulanate (AUGMENTIN) 875-125 MG tablet   Hyponatremia       Relevant Orders   Basic Metabolic Panel (BMET)          Sinusitis: Persistent sinus headache, nasal burning, and pressure for over 10 days. No significant nasal drainage. Symptoms exacerbated by cold air. Tried conservative measures including steam, saline solution, Sudafed, Tylenol, and ibuprofen with partial relief. -Start Augmentin twice daily for 7 days. -Consider Flonase once nasal dryness improves.  Gastroesophageal Reflux Disease: Previously controlled with Protonix. -Refill Protonix prescription.  Follow-up appointment scheduled for November.        Return if symptoms worsen or fail to improve.    Shirlee Latch, MD

## 2023-01-06 ENCOUNTER — Other Ambulatory Visit: Payer: Self-pay | Admitting: Family Medicine

## 2023-01-08 ENCOUNTER — Encounter: Admission: RE | Admit: 2023-01-08 | Payer: Medicare HMO | Source: Ambulatory Visit

## 2023-01-08 ENCOUNTER — Encounter
Admission: RE | Admit: 2023-01-08 | Discharge: 2023-01-08 | Disposition: A | Discharge status: Home / Self Care | Payer: Medicare HMO | Source: Ambulatory Visit | Attending: Oncology | Admitting: Oncology

## 2023-01-08 ENCOUNTER — Ambulatory Visit
Admission: RE | Admit: 2023-01-08 | Discharge: 2023-01-08 | Disposition: A | Discharge status: Home / Self Care | Payer: Medicare HMO | Source: Ambulatory Visit | Attending: Oncology | Admitting: Oncology

## 2023-01-08 DIAGNOSIS — C50912 Malignant neoplasm of unspecified site of left female breast: Secondary | ICD-10-CM | POA: Insufficient documentation

## 2023-01-08 DIAGNOSIS — I700 Atherosclerosis of aorta: Secondary | ICD-10-CM | POA: Diagnosis not present

## 2023-01-08 DIAGNOSIS — R7989 Other specified abnormal findings of blood chemistry: Secondary | ICD-10-CM

## 2023-01-08 DIAGNOSIS — K8689 Other specified diseases of pancreas: Secondary | ICD-10-CM | POA: Diagnosis not present

## 2023-01-08 DIAGNOSIS — C784 Secondary malignant neoplasm of small intestine: Secondary | ICD-10-CM | POA: Insufficient documentation

## 2023-01-08 DIAGNOSIS — C179 Malignant neoplasm of small intestine, unspecified: Secondary | ICD-10-CM | POA: Diagnosis not present

## 2023-01-08 DIAGNOSIS — C50919 Malignant neoplasm of unspecified site of unspecified female breast: Secondary | ICD-10-CM | POA: Diagnosis not present

## 2023-01-08 DIAGNOSIS — K5730 Diverticulosis of large intestine without perforation or abscess without bleeding: Secondary | ICD-10-CM | POA: Diagnosis not present

## 2023-01-08 MED ORDER — IOHEXOL 300 MG/ML  SOLN
100.0000 mL | Freq: Once | INTRAMUSCULAR | Status: AC | PRN
  Administered 2023-01-08: 100 mL via INTRAVENOUS

## 2023-01-08 MED ORDER — TECHNETIUM TC 99M MEDRONATE IV KIT
20.0000 | PACK | Freq: Once | INTRAVENOUS | Status: AC | PRN
  Administered 2023-01-08: 21.26 via INTRAVENOUS

## 2023-01-12 ENCOUNTER — Telehealth: Payer: Self-pay | Admitting: Radiology

## 2023-01-12 NOTE — Telephone Encounter (Signed)
67 y.o. female outpatient. 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 Last exchanged on 6.14.24. Patient called reporting a fever of 100.4 and chills that started on 7.31.24  and urine that is darker in color X 1 day. Patient reports one episode of leaking to the right internal external biliary drain that on 7.31.24. The leaking and the fever have both self resolved. Regarding the dark urine, Ms Dobransky does states that she has an issue in general with dehydration and has not eaten anything since the evening of 8.1.24. Of note the Patient completed a course of Augmentin on 7.31.24 for sinusitis  Per report the drains appear to be functional and they are all able to be  flush with output noted to be in the internal external biliary drain.  T As Patient's drains appear to be functional without issues and her fever and leakage as self resolved recommend Ms Lofaso come to IR at her previously scheduled exchange on 8.8.24. Ms Escandon encouraged to go to her nearest emergency department should any signs or symptoms of infection return and the drains are not functional.  Ms Constantine verbalized understanding and is in agreement with the plan of care.  Ms Caraveo was also given the Patient access PRA number should any additional questions or concerns arise.

## 2023-01-16 ENCOUNTER — Other Ambulatory Visit: Payer: Self-pay

## 2023-01-16 ENCOUNTER — Other Ambulatory Visit: Payer: Medicare HMO

## 2023-01-16 ENCOUNTER — Inpatient Hospital Stay (HOSPITAL_BASED_OUTPATIENT_CLINIC_OR_DEPARTMENT_OTHER): Payer: Medicare HMO | Admitting: Oncology

## 2023-01-16 ENCOUNTER — Inpatient Hospital Stay: Payer: Medicare HMO | Attending: Oncology

## 2023-01-16 ENCOUNTER — Encounter: Payer: Self-pay | Admitting: Oncology

## 2023-01-16 ENCOUNTER — Other Ambulatory Visit: Payer: Self-pay | Admitting: *Deleted

## 2023-01-16 ENCOUNTER — Ambulatory Visit: Payer: Medicare HMO | Admitting: Oncology

## 2023-01-16 VITALS — BP 108/80 | HR 95 | Temp 97.1°F | Resp 18 | Ht 62.0 in | Wt 128.0 lb

## 2023-01-16 DIAGNOSIS — C50912 Malignant neoplasm of unspecified site of left female breast: Secondary | ICD-10-CM

## 2023-01-16 DIAGNOSIS — C784 Secondary malignant neoplasm of small intestine: Secondary | ICD-10-CM | POA: Insufficient documentation

## 2023-01-16 DIAGNOSIS — Z170 Estrogen receptor positive status [ER+]: Secondary | ICD-10-CM | POA: Diagnosis not present

## 2023-01-16 DIAGNOSIS — R7989 Other specified abnormal findings of blood chemistry: Secondary | ICD-10-CM

## 2023-01-16 DIAGNOSIS — Z79899 Other long term (current) drug therapy: Secondary | ICD-10-CM | POA: Diagnosis not present

## 2023-01-16 DIAGNOSIS — C50919 Malignant neoplasm of unspecified site of unspecified female breast: Secondary | ICD-10-CM | POA: Insufficient documentation

## 2023-01-16 DIAGNOSIS — R748 Abnormal levels of other serum enzymes: Secondary | ICD-10-CM | POA: Diagnosis not present

## 2023-01-16 DIAGNOSIS — Z79811 Long term (current) use of aromatase inhibitors: Secondary | ICD-10-CM | POA: Diagnosis not present

## 2023-01-16 LAB — CBC WITH DIFFERENTIAL/PLATELET
Abs Immature Granulocytes: 0.05 10*3/uL (ref 0.00–0.07)
Basophils Absolute: 0.1 10*3/uL (ref 0.0–0.1)
Basophils Relative: 1 %
Eosinophils Absolute: 0 10*3/uL (ref 0.0–0.5)
Eosinophils Relative: 1 %
HCT: 38.1 % (ref 36.0–46.0)
Hemoglobin: 12.7 g/dL (ref 12.0–15.0)
Immature Granulocytes: 1 %
Lymphocytes Relative: 20 %
Lymphs Abs: 1.7 10*3/uL (ref 0.7–4.0)
MCH: 30.8 pg (ref 26.0–34.0)
MCHC: 33.3 g/dL (ref 30.0–36.0)
MCV: 92.3 fL (ref 80.0–100.0)
Monocytes Absolute: 0.5 10*3/uL (ref 0.1–1.0)
Monocytes Relative: 6 %
Neutro Abs: 6.2 10*3/uL (ref 1.7–7.7)
Neutrophils Relative %: 71 %
Platelets: 467 10*3/uL — ABNORMAL HIGH (ref 150–400)
RBC: 4.13 MIL/uL (ref 3.87–5.11)
RDW: 13.6 % (ref 11.5–15.5)
WBC: 8.5 10*3/uL (ref 4.0–10.5)
nRBC: 0 % (ref 0.0–0.2)

## 2023-01-16 LAB — COMPREHENSIVE METABOLIC PANEL
ALT: 28 U/L (ref 0–44)
AST: 18 U/L (ref 15–41)
Albumin: 3.9 g/dL (ref 3.5–5.0)
Alkaline Phosphatase: 156 U/L — ABNORMAL HIGH (ref 38–126)
Anion gap: 9 (ref 5–15)
BUN: 30 mg/dL — ABNORMAL HIGH (ref 8–23)
CO2: 23 mmol/L (ref 22–32)
Calcium: 9.4 mg/dL (ref 8.9–10.3)
Chloride: 100 mmol/L (ref 98–111)
Creatinine, Ser: 1.19 mg/dL — ABNORMAL HIGH (ref 0.44–1.00)
GFR, Estimated: 49 mL/min — ABNORMAL LOW (ref >60)
Glucose, Bld: 152 mg/dL — ABNORMAL HIGH (ref 70–99)
Potassium: 4.8 mmol/L (ref 3.5–5.1)
Sodium: 132 mmol/L — ABNORMAL LOW (ref 135–145)
Total Bilirubin: 0.7 mg/dL (ref 0.3–1.2)
Total Protein: 8.3 g/dL — ABNORMAL HIGH (ref 6.5–8.1)

## 2023-01-16 LAB — MAGNESIUM: Magnesium: 2.3 mg/dL (ref 1.7–2.4)

## 2023-01-16 MED ORDER — POTASSIUM CHLORIDE CRYS ER 20 MEQ PO TBCR: 20.0000 meq | EXTENDED_RELEASE_TABLET | Freq: Every day | ORAL | 3 refills | Status: AC

## 2023-01-16 MED ORDER — ONDANSETRON HCL 4 MG PO TABS: 4.0000 mg | ORAL_TABLET | Freq: Every day | ORAL | 2 refills | Status: AC | PRN

## 2023-01-16 NOTE — Addendum Note (Signed)
Addended by: Corene Cornea on: 01/16/2023 02:20 PM   Modules accepted: Orders

## 2023-01-16 NOTE — Progress Notes (Signed)
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  She was found to have diffuse duodenal wall thickening as well as a palpable mass in the left breast.  Duodenal biopsy as well as left breast biopsy was consistent withER positive greater than 90%, PR weakly +1 to 10% HER2 negative lobular breast cancer.  Patient underwent external biliary drain for the obstructive jaundice and palliative gastrojejunostomy surgery to relieve her gastric outlet obstruction.CT scan otherwise did not show any evidence of distant metastatic disease.   Patient was started on letrozole plus Kisqali in early April 2023.  However patient developed AKI and hyperuricemia requiring hospitalizations.  Idelle Jo was therefore stopped and patient was started on Ibrance 75 mg in May 2023.NGS testing showed no actionable mutations.  PD-L1 CPS score 1.  MSI stable.  Tumor mutational burden not high.    Interval history-patient had a couple of episodes of low-grade fever over the last 2 weeks.  Her last episode of low-grade fever was 3 days ago and since then she has not had any further temperature spikes.  Over the last 4 to 5 days she has had some drenching sweats as well.  Her PTC is currently draining well and she is scheduled for an exchange in 2 days time.  She  does feel that the lower dose of Ilda Foil is working well for her.  Fatigue is better  ECOG PS- 1 Pain scale- 0   Review of systems- Review of Systems  Constitutional:  Positive for malaise/fatigue. Negative for chills, fever and weight loss.  HENT:  Negative for congestion, ear discharge and nosebleeds.   Eyes:  Negative for blurred vision.  Respiratory:  Negative for cough, hemoptysis, sputum production, shortness of breath and wheezing.   Cardiovascular:  Negative for chest pain, palpitations, orthopnea and claudication.  Gastrointestinal:  Negative for abdominal pain, blood in stool, constipation, diarrhea, heartburn, melena, nausea and vomiting.  Genitourinary:  Negative for dysuria, flank pain, frequency, hematuria and urgency.  Musculoskeletal:  Negative for back pain, joint pain and myalgias.  Skin:  Negative for rash.  Neurological:  Negative for dizziness, tingling, focal weakness, seizures, weakness and headaches.  Endo/Heme/Allergies:  Does not bruise/bleed easily.  Psychiatric/Behavioral:  Negative for depression and suicidal ideas. The patient does not have insomnia.       Allergies  Allergen Reactions   Peanuts [Peanut Oil] Shortness Of Breath and Itching   Levaquin [Levofloxacin] Other (See Comments)    Severe abdominal pain and feeling mentally "in a dark place".   Flexeril [Cyclobenzaprine] Palpitations    Elevated heart rate after third day of taking     Past Medical History:  Diagnosis Date   Anemia    Anxiety    Breast cancer (HCC)    Gallstones    GERD (gastroesophageal reflux disease)    Hyperlipidemia    Hypertension    Jaundice 08/29/2021  Rash 08/29/2021   Right upper quadrant abdominal pain 08/29/2021   UTI (urinary tract infection) 08/29/2021     Past Surgical History:  Procedure Laterality Date   BILATERAL SALPINGECTOMY  04/03/1997   CHOLECYSTECTOMY N/A 07/07/2015   Procedure: LAPAROSCOPIC CHOLECYSTECTOMY ;  Surgeon: Leafy Ro, MD;   Location: [facility] ORS;  Service: General;  Laterality: N/A;   ESOPHAGOGASTRODUODENOSCOPY  08/30/2021   Procedure: ESOPHAGOGASTRODUODENOSCOPY (EGD);  Surgeon: Midge Minium, MD;  Location: [facility] ENDOSCOPY;  Service: Endoscopy;;   GALLBLADDER SURGERY  07/07/2015   [facility]   IR BILIARY DILITATION  08/22/2022   IR BILIARY DRAIN PLACEMENT WITH CHOLANGIOGRAM  08/31/2021   IR BILIARY STENT(S) NEW ACCESS WITH DRAIN  02/08/2022   IR CHOLANGIOGRAM EXISTING TUBE  08/17/2022   IR CONVERT BILIARY DRAIN TO INT EXT BILIARY DRAIN  09/02/2021   IR EXCHANGE BILIARY DRAIN  10/10/2021   IR EXCHANGE BILIARY DRAIN  12/05/2021   IR EXCHANGE BILIARY DRAIN  01/19/2022   IR EXCHANGE BILIARY DRAIN  10/13/2022   IR EXCHANGE BILIARY DRAIN  11/24/2022   IR INT EXT BILIARY DRAIN WITH CHOLANGIOGRAM  07/17/2022   IR RADIOLOGIST EVAL & MGMT  03/01/2022   IR RADIOLOGIST EVAL & MGMT  08/17/2022    Social History   Socioeconomic History   Marital status: Married    Spouse name: [name]   Number of children: 4   Years of education: Not on file   Highest education level: Not on file  Occupational History   Not on file  Tobacco Use   Smoking status: Never   Smokeless tobacco: Never  Vaping Use   Vaping status: Never Used  Substance and Sexual Activity   Alcohol use: No   Drug use: No   Sexual activity: Yes  Other Topics Concern   Not on file  Social History Narrative   Lives with spouse at home: [name] has Parkinson's    Social Determinants of Health   Financial Resource Strain: Low Risk  (11/24/2021)   Overall Financial Resource Strain (CARDIA)    Difficulty of Paying Living Expenses: Not hard at all  Food Insecurity: No Food Insecurity (11/10/2022)   Hunger Vital Sign    Worried About Radiation protection practitioner of Food in the Last Year: Never true    Ran Out of Food in the Last Year: Never true  Transportation Needs: No Transportation Needs (11/10/2022)   PRAPARE - Administrator, Civil Service (Medical): No    Lack of  Transportation (Non-Medical): No  Physical Activity: Not on file  Stress: No Stress Concern Present (11/24/2021)   [employer] of Occupational Health - Occupational Stress Questionnaire    Feeling of Stress : Only a little  Social Connections: Unknown (11/24/2021)   Social Connection and Isolation Panel [NHANES]    Frequency of Communication with Friends and Family: Three times a week    Frequency of Social Gatherings with Friends and Family: Three times a week    Attends Religious Services: Patient declined    Active Member of Clubs or Organizations: Patient declined    Attends Banker Meetings: Patient declined    Marital Status: Married  Catering manager Violence: Not At Risk (07/17/2022)   Humiliation, Afraid, Rape, and Kick questionnaire    Fear of Current or Ex-Partner: No    Emotionally Abused: No    Physically Abused: No    Sexually Abused: No    Family History  Problem Relation Age of Onset   Hypertension Mother  CVA Mother    Ulcers Mother    Emphysema Mother    Heart disease Father    Prostate cancer Father    Macular degeneration Father    Hypertension Sister    Diabetes Sister    Cervical polyp Sister    Hypertension Sister    Diabetes Sister    Lymphoma Sister      Current Outpatient Medications:    acetaminophen (TYLENOL) 325 MG tablet, Take 325 mg by mouth every 6 (six) hours as needed (for pain and sometimes takes 2 if pain is worse)., Disp: , Rfl:    baclofen (LIORESAL) 10 MG tablet, Take 10 mg by mouth 3 (three) times daily as needed., Disp: , Rfl:    letrozole (FEMARA) 2.5 MG tablet, Take 1 tablet (2.5 mg total) by mouth daily., Disp: 90 tablet, Rfl: 1   levothyroxine (SYNTHROID) 88 MCG tablet, Take 1 tablet by mouth once daily, Disp: 90 tablet, Rfl: 0   Multiple Vitamins-Minerals (CENTRUM VITAMINTS PO), Take 1 tablet by mouth daily., Disp: , Rfl:    palbociclib (IBRANCE) 75 MG tablet, Take 1 tablet (75 mg total) by mouth daily. Take  for 21 days on, 7 days off, repeat every 28 days., Disp: 21 tablet, Rfl: 0   pantoprazole (PROTONIX) 40 MG tablet, Take 1 tablet (40 mg total) by mouth daily., Disp: 90 tablet, Rfl: 1   prochlorperazine (COMPAZINE) 5 MG tablet, Take 1 tablet (5 mg total) by mouth every 6 (six) hours as needed for refractory nausea / vomiting., Disp: 30 tablet, Rfl: 1   traZODone (DESYREL) 50 MG tablet, Take 50 mg by mouth at bedtime., Disp: , Rfl:    ondansetron (ZOFRAN) 4 MG tablet, Take 1 tablet (4 mg total) by mouth daily as needed for nausea or vomiting., Disp: 30 tablet, Rfl: 2   oxyCODONE (ROXICODONE) 5 MG immediate release tablet, Take 1 tablet (5 mg total) by mouth every 4 (four) hours as needed for severe pain. (Patient not taking: Reported on 01/16/2023), Disp: 30 tablet, Rfl: 0  Physical exam:  Vitals:   01/16/23 1305  BP: 108/80  Pulse: 95  Resp: 18  Temp: (!) 97.1 F (36.2 C)  TempSrc: Tympanic  SpO2: 99%  Weight: 128 lb (58.1 kg)  Height: 5\' 2"  (1.575 m)   Physical Exam Cardiovascular:     Rate and Rhythm: Normal rate and regular rhythm.     Heart sounds: Normal heart sounds.  Pulmonary:     Effort: Pulmonary effort is normal.     Breath sounds: Normal breath sounds.  Abdominal:     General: Bowel sounds are normal.     Palpations: Abdomen is soft.     Comments: PTCA in place  Musculoskeletal:     Cervical back: Normal range of motion.  Skin:    General: Skin is warm and dry.  Neurological:     Mental Status: She is alert and oriented to person, place, and time.         Latest Ref Rng & Units 01/16/2023   12:41 PM  CMP  Glucose 70 - 99 mg/dL 960   BUN 8 - 23 mg/dL 30   Creatinine 4.54 - 1.00 mg/dL 0.98   Sodium 119 - 147 mmol/L 132   Potassium 3.5 - 5.1 mmol/L 4.8   Chloride 98 - 111 mmol/L 100   CO2 22 - 32 mmol/L 23   Calcium 8.9 - 10.3 mg/dL 9.4   Total Protein 6.5 - 8.1 g/dL 8.3  Total Bilirubin 0.3 - 1.2 mg/dL 0.7   Alkaline Phos 38 - 126 U/L 156   AST 15 - 41  U/L 18   ALT 0 - 44 U/L 28       Latest Ref Rng & Units 01/16/2023   12:41 PM  CBC  WBC 4.0 - 10.5 K/uL 8.5   Hemoglobin 12.0 - 15.0 g/dL 78.2   Hematocrit 95.6 - 46.0 % 38.1   Platelets 150 - 400 K/uL 467     No images are attached to the encounter.  NM Bone Scan Whole Body  Result Date: 01/14/2023 CLINICAL DATA:  Breast cancer metastasized to small intestine, left EXAM: NUCLEAR MEDICINE WHOLE BODY BONE SCAN TECHNIQUE: Whole body anterior and posterior images were obtained approximately 3 hours after intravenous injection of radiopharmaceutical. RADIOPHARMACEUTICALS:  23.44 mCi Technetium-64m MDP IV COMPARISON:  July 24, 2022, September 05, 2021 FINDINGS: No suspicious focal radiotracer uptake. There is radiotracer uptake within the sternoclavicular joints and bilateral shoulders. This is favored to be degenerative in etiology. Expected physiologic distribution of radiotracer. IMPRESSION: No scintigraphic evidence of osseous metastatic disease. Electronically Signed   By: Meda Klinefelter M.D.   On: 01/14/2023 11:10   CT CHEST ABDOMEN PELVIS W CONTRAST  Result Date: 01/08/2023 CLINICAL DATA:  Metastatic breast cancer restaging * Tracking Code: BO * EXAM: CT CHEST, ABDOMEN, AND PELVIS WITH CONTRAST TECHNIQUE: Multidetector CT imaging of the chest, abdomen and pelvis was performed following the standard protocol during bolus administration of intravenous contrast. RADIATION DOSE REDUCTION: This exam was performed according to the departmental dose-optimization program which includes automated exposure control, adjustment of the mA and/or kV according to patient size and/or use of iterative reconstruction technique. CONTRAST:  OMNIPAQUE IOHEXOL 300 MG/ML SOLN additional oral enteric contrast COMPARISON:  11/06/2022 FINDINGS: CT CHEST FINDINGS Cardiovascular: Aortic atherosclerosis. Normal heart size. Three-vessel coronary artery calcifications. No pericardial effusion. Mediastinum/Nodes: No  enlarged mediastinal, hilar, or axillary lymph nodes. Thyroid gland, trachea, and esophagus demonstrate no significant findings. Lungs/Pleura: Bandlike scarring and volume loss of the bilateral lung bases, unchanged (series 4, image 96). No pleural effusion or pneumothorax. Musculoskeletal: No chest wall abnormality. No acute osseous findings. CT ABDOMEN PELVIS FINDINGS Hepatobiliary: Unchanged right lobe approach percutaneous biliary drain, with formed pigtail in the ascending duodenum. Severe intra and extrahepatic biliary ductal dilatation and postprocedural pneumobilia, unchanged. Common bile duct measures at least 2.3 cm in caliber. Status post cholecystectomy. Pancreas: Unchanged, mildly atrophic pancreas, with diffuse pancreatic ductal dilatation up to 0.9 cm (series 2, image 60). Spleen: Normal in size without significant abnormality. Adrenals/Urinary Tract: Adrenal glands are unremarkable. Kidneys are normal, without renal calculi, solid lesion, or hydronephrosis. Bladder is unremarkable. Stomach/Bowel: Status post gastrojejunostomy. Appendix appears normal. No evidence of bowel wall thickening, distention, or inflammatory changes. Sigmoid diverticulosis. Vascular/Lymphatic: Aortic atherosclerosis. Numerous unchanged prominent subcentimeter retroperitoneal lymph nodes (series 2, image 65). Reproductive: No mass or other abnormality. Other: No abdominal wall hernia or abnormality. No ascites. Musculoskeletal: No acute osseous findings. IMPRESSION: 1. No specific evidence of mass or metastatic disease in the chest, abdomen, or pelvis. 2. Status post gastrojejunostomy. 3. Unchanged right lobe approach percutaneous biliary drain, with formed pigtail in the ascending duodenum. Severe intra and extrahepatic biliary ductal dilatation and postprocedural pneumobilia, unchanged. Common bile duct measures at least 2.3 cm in caliber. 4. Unchanged, mildly atrophic pancreas, with diffuse pancreatic ductal dilatation up  to 0.9 cm. 5. Sigmoid diverticulosis without evidence of acute diverticulitis. 6. Coronary artery disease. Aortic Atherosclerosis (ICD10-I70.0). Electronically  Signed   By: Jearld Lesch M.D.   On: 01/08/2023 11:52   CT Head Wo Contrast  Result Date: 12/25/2022 CLINICAL DATA:  Sudden severe headache EXAM: CT HEAD WITHOUT CONTRAST TECHNIQUE: Contiguous axial images were obtained from the base of the skull through the vertex without intravenous contrast. RADIATION DOSE REDUCTION: This exam was performed according to the departmental dose-optimization program which includes automated exposure control, adjustment of the mA and/or kV according to patient size and/or use of iterative reconstruction technique. COMPARISON:  None Available. FINDINGS: Brain: There is no mass, hemorrhage or extra-axial collection. The size and configuration of the ventricles and extra-axial CSF spaces are normal. The brain parenchyma is normal, without acute or chronic infarction. Vascular: No abnormal hyperdensity of the major intracranial arteries or dural venous sinuses. No intracranial atherosclerosis. Skull: The visualized skull base, calvarium and extracranial soft tissues are normal. Sinuses/Orbits: No fluid levels or advanced mucosal thickening of the visualized paranasal sinuses. No mastoid or middle ear effusion. The orbits are normal. IMPRESSION: Normal head CT. Electronically Signed   By: Deatra Robinson M.D.   On: 12/25/2022 19:53     Assessment and plan- Patient is a 67 y.o. female  with metastatic ER/PR positive HER2 negative lobular breast cancer presenting with duodenal obstruction and obstructive jaundice s/p palliative gastrojejunostomy.  She is currently on Ibrance and letrozole and this is a routine follow-up visit  I have asked her to hold off on taking her Ibrance until she has the PTCA exchanged in 2 days time.  She will restart Ibrance 4 to 5 days after the procedure is done.  She will continue letrozole.  I  have reviewed CT chest abdomen pelvis images independently and discussed findings with the patient which shows overall no new evidence of disease or progressive disease.  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  Plans overall to continue with letrozole plus Ibrance until progression or toxicity.  LFTs today are normal.  Mildly elevated alkaline phosphatase can be monitored.  Patient was in the ER about a couple of weeks ago for intractable headache and at that time she was found to have a low magnesium.  Magnesium levels are normal today and she can take over-the-counter magnesium.  I have also given her 20 mill equivalents of potassium prescription to be taken once a day.  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  Labs BMP and magnesium in 1 month and I will see her back in 3 months with CBC with differential CMP CA 27-29 and CA 15-3   Visit Diagnosis 1. 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 High risk medication use      Dr. Owens Shark, MD, MPH [facility] at [facility] [phone] 01/16/2023 1:52 PM

## 2023-01-17 ENCOUNTER — Other Ambulatory Visit: Payer: Self-pay | Admitting: Student

## 2023-01-17 ENCOUNTER — Encounter: Payer: Self-pay | Admitting: Radiology

## 2023-01-17 DIAGNOSIS — K831 Obstruction of bile duct: Secondary | ICD-10-CM

## 2023-01-17 NOTE — Progress Notes (Signed)
Patient for IR Biliary Drain Exchange on Thurs 01/18/23, I called and spoke with the patient on the phone and gave pre-procedure instructions. Pt was made aware to be here at 7:30a, NPO after MN prior to procedure as well as driver post procedure/recovery/discharge. Pt stated understanding.  Called 01/17/23

## 2023-01-17 NOTE — H&P (Signed)
Chief Complaint: Patient was seen in consultation today for biliary drain at the request of Suttle,Dylan J  Referring Physician(s): Bennie Dallas  Supervising Physician: Marliss Coots  Patient Status: [facility] - Out-pt  History of Present Illness: Diamond Hinton is a 67 y.o. female with PMH significant for metastatic breast cancer and malignant biliary obstruction, gallstones, and hypertension. She is scheduled today for her routine image-guided biliary drain exchange under moderate sedation.  She underwent PTBD 08/31/21, CBD stent 02/08/22, and placement of right-sided internal/external biliary drainage catheter 07/17/22. Upon discharge from [facility], drain was capped and pt was provided with gravity bag. At home, she developed low-grade fever and malaise. She connected to gravity bag and then recapped a few days later. She presented to IR 08/17/22 for evaluation of drain that revealed drain and biliary stent occlusion. She subsequently underwent image-guided cholangioplasty with biliary drain exchange. Patient most recently underwent biliary drain exchange on 10/13/22 with 20F in place and 7cm of extended custom side holes.   The patient has recently had episodes of low grade fevers and chills with one episode of biliary drain leaking at exterior site on 7/31, these symptoms have since resolved and the drain is currently flushing well and draining well without leakage. The patient denies any current fever or chills.   The patient denies any abdominal pain, current chest pain or shortness of breath. The patient denies any history of sleep apnea or chronic oxygen use. She has no known complications to sedation or iodinated contrast.    Past Medical History:  Diagnosis Date   Anemia    Anxiety    Breast cancer (HCC)    Gallstones    GERD (gastroesophageal reflux disease)    Hyperlipidemia    Hypertension    Jaundice 08/29/2021   Rash 08/29/2021   Right upper quadrant abdominal pain 08/29/2021    UTI (urinary tract infection) 08/29/2021    Past Surgical History:  Procedure Laterality Date   BILATERAL SALPINGECTOMY  04/03/1997   CHOLECYSTECTOMY N/A 07/07/2015   Procedure: LAPAROSCOPIC CHOLECYSTECTOMY ;  Surgeon: Leafy Ro, MD;  Location: [facility] ORS;  Service: General;  Laterality: N/A;   ESOPHAGOGASTRODUODENOSCOPY  08/30/2021   Procedure: ESOPHAGOGASTRODUODENOSCOPY (EGD);  Surgeon: Midge Minium, MD;  Location: [facility] ENDOSCOPY;  Service: Endoscopy;;   GALLBLADDER SURGERY  07/07/2015   [facility]   IR BILIARY DILITATION  08/22/2022   IR BILIARY DRAIN PLACEMENT WITH CHOLANGIOGRAM  08/31/2021   IR BILIARY STENT(S) NEW ACCESS WITH DRAIN  02/08/2022   IR CHOLANGIOGRAM EXISTING TUBE  08/17/2022   IR CONVERT BILIARY DRAIN TO INT EXT BILIARY DRAIN  09/02/2021   IR EXCHANGE BILIARY DRAIN  10/10/2021   IR EXCHANGE BILIARY DRAIN  12/05/2021   IR EXCHANGE BILIARY DRAIN  01/19/2022   IR EXCHANGE BILIARY DRAIN  10/13/2022   IR EXCHANGE BILIARY DRAIN  11/24/2022   IR INT EXT BILIARY DRAIN WITH CHOLANGIOGRAM  07/17/2022   IR RADIOLOGIST EVAL & MGMT  03/01/2022   IR RADIOLOGIST EVAL & MGMT  08/17/2022    Allergies: Peanuts [peanut oil], Levaquin [levofloxacin], and Flexeril [cyclobenzaprine]  Medications: Prior to Admission medications   Medication Sig Start Date End Date Taking? Authorizing Provider  acetaminophen (TYLENOL) 325 MG tablet Take 325 mg by mouth every 6 (six) hours as needed (for pain and sometimes takes 2 if pain is worse).    [provider]  baclofen (LIORESAL) 10 MG tablet Take 10 mg by mouth 3 (three) times daily as needed.    [provider]  letrozole (FEMARA) 2.5 MG tablet Take 1 tablet (2.5 mg total) by mouth daily. 11/02/22   Creig Hines, MD  levothyroxine (SYNTHROID) 88 MCG tablet Take 1 tablet by mouth once daily 12/20/22   Erasmo Downer, MD  Multiple Vitamins-Minerals (CENTRUM VITAMINTS PO) Take 1 tablet by mouth daily.    [provider]  ondansetron  (ZOFRAN) 4 MG tablet Take 1 tablet (4 mg total) by mouth daily as needed for nausea or vomiting. 01/16/23 01/16/24  Creig Hines, MD  oxyCODONE (ROXICODONE) 5 MG immediate release tablet Take 1 tablet (5 mg total) by mouth every 4 (four) hours as needed for severe pain. Patient not taking: Reported on 01/16/2023 07/24/22   Erasmo Downer, MD  palbociclib Ilda Foil) 75 MG tablet Take 1 tablet (75 mg total) by mouth daily. Take for 21 days on, 7 days off, repeat every 28 days. 12/29/22   Rushie Chestnut, PA-C  pantoprazole (PROTONIX) 40 MG tablet Take 1 tablet (40 mg total) by mouth daily. 01/04/23   Bacigalupo, Marzella Schlein, MD  potassium chloride SA (KLOR-CON M) 20 MEQ tablet Take 1 tablet (20 mEq total) by mouth daily. 01/16/23   Creig Hines, MD  prochlorperazine (COMPAZINE) 5 MG tablet Take 1 tablet (5 mg total) by mouth every 6 (six) hours as needed for refractory nausea / vomiting. 02/14/22   Creig Hines, MD  traZODone (DESYREL) 50 MG tablet Take 50 mg by mouth at bedtime.    [provider]     Family History  Problem Relation Age of Onset   Hypertension Mother    CVA Mother    Ulcers Mother    Emphysema Mother    Heart disease Father    Prostate cancer Father    Macular degeneration Father    Hypertension Sister    Diabetes Sister    Cervical polyp Sister    Hypertension Sister    Diabetes Sister    Lymphoma Sister     Social History   Socioeconomic History   Marital status: Married    Spouse name: [name]   Number of children: 4   Years of education: Not on file   Highest education level: Not on file  Occupational History   Not on file  Tobacco Use   Smoking status: Never   Smokeless tobacco: Never  Vaping Use   Vaping status: Never Used  Substance and Sexual Activity   Alcohol use: No   Drug use: No   Sexual activity: Yes  Other Topics Concern   Not on file  Social History Narrative   Lives with spouse at home: [name] has Parkinson's    Social  Determinants of Health   Financial Resource Strain: Low Risk  (11/24/2021)   Overall Financial Resource Strain (CARDIA)    Difficulty of Paying Living Expenses: Not hard at all  Food Insecurity: No Food Insecurity (11/10/2022)   Hunger Vital Sign    Worried About Running Out of Food in the Last Year: Never true    Ran Out of Food in the Last Year: Never true  Transportation Needs: No Transportation Needs (11/10/2022)   PRAPARE - Administrator, Civil Service (Medical): No    Lack of Transportation (Non-Medical): No  Physical Activity: Not on file  Stress: No Stress Concern Present (11/24/2021)   [employer] of Occupational Health - Occupational Stress Questionnaire    Feeling of Stress : Only a little  Social Connections: Unknown (11/24/2021)  Social Connection and Isolation Panel [NHANES]    Frequency of Communication with Friends and Family: Three times a week    Frequency of Social Gatherings with Friends and Family: Three times a week    Attends Religious Services: Patient declined    Active Member of Clubs or Organizations: Patient declined    Attends Banker Meetings: Patient declined    Marital Status: Married    Review of Systems: A 12 point ROS discussed and pertinent positives are indicated in the HPI above.  All other systems are negative.  Review of Systems  Vital Signs: BP 102/66   Pulse 84   Temp 97.7 F (36.5 C) (Oral)   Resp 19   Ht 5\' 3"  (1.6 m)   Wt 128 lb (58.1 kg)   SpO2 98%   BMI 22.67 kg/m   Physical Exam Constitutional:      General: She is not in acute distress. HENT:     Head: Normocephalic and atraumatic.  Cardiovascular:     Rate and Rhythm: Normal rate and regular rhythm.  Pulmonary:     Effort: Pulmonary effort is normal. No respiratory distress.  Abdominal:     General: There is no distension.     Palpations: Abdomen is soft.     Tenderness: There is no abdominal tenderness.     Comments: RUQ biliary drain  intact with light colored bilious output in gravity bag  Neurological:     Mental Status: She is alert and oriented to person, place, and time.     Imaging: NM Bone Scan Whole Body  Result Date: 01/14/2023 CLINICAL DATA:  Breast cancer metastasized to small intestine, left EXAM: NUCLEAR MEDICINE WHOLE BODY BONE SCAN TECHNIQUE: Whole body anterior and posterior images were obtained approximately 3 hours after intravenous injection of radiopharmaceutical. RADIOPHARMACEUTICALS:  23.44 mCi Technetium-78m MDP IV COMPARISON:  July 24, 2022, September 05, 2021 FINDINGS: No suspicious focal radiotracer uptake. There is radiotracer uptake within the sternoclavicular joints and bilateral shoulders. This is favored to be degenerative in etiology. Expected physiologic distribution of radiotracer. IMPRESSION: No scintigraphic evidence of osseous metastatic disease. Electronically Signed   By: Meda Klinefelter M.D.   On: 01/14/2023 11:10   CT CHEST ABDOMEN PELVIS W CONTRAST  Result Date: 01/08/2023 CLINICAL DATA:  Metastatic breast cancer restaging * Tracking Code: BO * EXAM: CT CHEST, ABDOMEN, AND PELVIS WITH CONTRAST TECHNIQUE: Multidetector CT imaging of the chest, abdomen and pelvis was performed following the standard protocol during bolus administration of intravenous contrast. RADIATION DOSE REDUCTION: This exam was performed according to the departmental dose-optimization program which includes automated exposure control, adjustment of the mA and/or kV according to patient size and/or use of iterative reconstruction technique. CONTRAST:  OMNIPAQUE IOHEXOL 300 MG/ML SOLN additional oral enteric contrast COMPARISON:  11/06/2022 FINDINGS: CT CHEST FINDINGS Cardiovascular: Aortic atherosclerosis. Normal heart size. Three-vessel coronary artery calcifications. No pericardial effusion. Mediastinum/Nodes: No enlarged mediastinal, hilar, or axillary lymph nodes. Thyroid gland, trachea, and esophagus  demonstrate no significant findings. Lungs/Pleura: Bandlike scarring and volume loss of the bilateral lung bases, unchanged (series 4, image 96). No pleural effusion or pneumothorax. Musculoskeletal: No chest wall abnormality. No acute osseous findings. CT ABDOMEN PELVIS FINDINGS Hepatobiliary: Unchanged right lobe approach percutaneous biliary drain, with formed pigtail in the ascending duodenum. Severe intra and extrahepatic biliary ductal dilatation and postprocedural pneumobilia, unchanged. Common bile duct measures at least 2.3 cm in caliber. Status post cholecystectomy. Pancreas: Unchanged, mildly atrophic pancreas, with diffuse pancreatic ductal dilatation  up to 0.9 cm (series 2, image 60). Spleen: Normal in size without significant abnormality. Adrenals/Urinary Tract: Adrenal glands are unremarkable. Kidneys are normal, without renal calculi, solid lesion, or hydronephrosis. Bladder is unremarkable. Stomach/Bowel: Status post gastrojejunostomy. Appendix appears normal. No evidence of bowel wall thickening, distention, or inflammatory changes. Sigmoid diverticulosis. Vascular/Lymphatic: Aortic atherosclerosis. Numerous unchanged prominent subcentimeter retroperitoneal lymph nodes (series 2, image 65). Reproductive: No mass or other abnormality. Other: No abdominal wall hernia or abnormality. No ascites. Musculoskeletal: No acute osseous findings. IMPRESSION: 1. No specific evidence of mass or metastatic disease in the chest, abdomen, or pelvis. 2. Status post gastrojejunostomy. 3. Unchanged right lobe approach percutaneous biliary drain, with formed pigtail in the ascending duodenum. Severe intra and extrahepatic biliary ductal dilatation and postprocedural pneumobilia, unchanged. Common bile duct measures at least 2.3 cm in caliber. 4. Unchanged, mildly atrophic pancreas, with diffuse pancreatic ductal dilatation up to 0.9 cm. 5. Sigmoid diverticulosis without evidence of acute diverticulitis. 6. Coronary  artery disease. Aortic Atherosclerosis (ICD10-I70.0). Electronically Signed   By: Jearld Lesch M.D.   On: 01/08/2023 11:52   CT Head Wo Contrast  Result Date: 12/25/2022 CLINICAL DATA:  Sudden severe headache EXAM: CT HEAD WITHOUT CONTRAST TECHNIQUE: Contiguous axial images were obtained from the base of the skull through the vertex without intravenous contrast. RADIATION DOSE REDUCTION: This exam was performed according to the departmental dose-optimization program which includes automated exposure control, adjustment of the mA and/or kV according to patient size and/or use of iterative reconstruction technique. COMPARISON:  None Available. FINDINGS: Brain: There is no mass, hemorrhage or extra-axial collection. The size and configuration of the ventricles and extra-axial CSF spaces are normal. The brain parenchyma is normal, without acute or chronic infarction. Vascular: No abnormal hyperdensity of the major intracranial arteries or dural venous sinuses. No intracranial atherosclerosis. Skull: The visualized skull base, calvarium and extracranial soft tissues are normal. Sinuses/Orbits: No fluid levels or advanced mucosal thickening of the visualized paranasal sinuses. No mastoid or middle ear effusion. The orbits are normal. IMPRESSION: Normal head CT. Electronically Signed   By: Deatra Robinson M.D.   On: 12/25/2022 19:53    Labs:  CBC: Recent Labs    11/22/22 1044 12/19/22 1039 12/25/22 1834 01/16/23 1241  WBC 10.3 5.1 9.2 8.5  HGB 13.5 13.1 13.6 12.7  HCT 39.8 40.9 39.8 38.1  PLT 467* 372 406* 467*    COAGS: Recent Labs    02/08/22 0918 08/22/22 1125 10/13/22 1313 11/06/22 0520  INR 1.3* 1.1 1.2 1.2  APTT  --   --   --  28    BMP: Recent Labs    11/22/22 1044 12/19/22 1039 12/25/22 1834 01/04/23 0928 01/16/23 1241  NA 130* 136 132* 139 132*  K 4.3 4.1 3.3* 4.7 4.8  CL 99 100 106 101 100  CO2 20* 26 16* 25 23  GLUCOSE 150* 150* 228* 125* 152*  BUN 30* 24* 39* 12 30*   CALCIUM 9.2 9.4 8.2* 9.2 9.4  CREATININE 1.21* 1.02* 1.39* 0.96 1.19*  GFRNONAA 49* 60* 41*  --  49*    LIVER FUNCTION TESTS: Recent Labs    11/22/22 1044 12/19/22 1039 12/25/22 1834 01/16/23 1241  BILITOT 1.1 0.5 0.7 0.7  AST 40 19 24 18   ALT 51* 28 24 28   ALKPHOS 277* 130* 94 156*  PROT 8.4* 8.1 7.4 8.3*  ALBUMIN 4.1 4.1 4.0 3.9     Assessment and Plan: This is a 67 y.o. female  with PMH  significant for metastatic breast cancer and malignant biliary obstruction, gallstones, and hypertension. She is scheduled today for her routine image-guided biliary drain exchange under moderate sedation. Currently drain is flushing well and draining well per patient.  She underwent PTBD 08/31/21, CBD stent 02/08/22, and placement of right-sided internal/external biliary drainage catheter 07/17/22. Upon discharge from [facility], drain was capped and pt was provided with gravity bag. At home, she developed low-grade fever and malaise. She connected to gravity bag and then recapped a few days later. She presented to IR 08/17/22 for evaluation of drain that revealed drain and biliary stent occlusion. She subsequently underwent image-guided cholangioplasty with biliary drain exchange. Patient most recently underwent biliary drain exchange on 10/13/22 with 80F in place and 7cm of extended custom side holes.   The patient has been NPO, imaging, labs and vitals have been reviewed.  Risks and benefits of image guided biliary drain exchange with moderate sedation discussed with the patient including, but not limited to bleeding, infection which may lead to sepsis, damage to adjacent structures.  This interventional procedure involves the use of X-rays and because of the nature of the planned procedure, it is possible that we will have prolonged use of X-ray fluoroscopy.  All of the patient's questions were answered, patient is agreeable to proceed.  Consent signed and in chart.  Thank you for this interesting  consult.  I greatly enjoyed meeting Diamond Hinton and look forward to participating in their care.  A copy of this report was sent to the requesting provider on this date.  Electronically Signed: Berneta Levins, PA-C 01/18/2023, 8:28 AM   I spent a total of 10 Minutes in face to face in clinical consultation, greater than 50% of which was counseling/coordinating care for bilary drain exchange.

## 2023-01-18 ENCOUNTER — Encounter: Payer: Self-pay | Admitting: Radiology

## 2023-01-18 ENCOUNTER — Ambulatory Visit
Admission: RE | Admit: 2023-01-18 | Discharge: 2023-01-18 | Disposition: A | Discharge status: Home / Self Care | Payer: Medicare HMO | Source: Ambulatory Visit | Attending: Interventional Radiology | Admitting: Interventional Radiology

## 2023-01-18 ENCOUNTER — Other Ambulatory Visit: Payer: Self-pay

## 2023-01-18 DIAGNOSIS — K831 Obstruction of bile duct: Secondary | ICD-10-CM | POA: Diagnosis not present

## 2023-01-18 DIAGNOSIS — Z434 Encounter for attention to other artificial openings of digestive tract: Secondary | ICD-10-CM | POA: Diagnosis not present

## 2023-01-18 DIAGNOSIS — Z853 Personal history of malignant neoplasm of breast: Secondary | ICD-10-CM | POA: Insufficient documentation

## 2023-01-18 DIAGNOSIS — R509 Fever, unspecified: Secondary | ICD-10-CM | POA: Diagnosis not present

## 2023-01-18 DIAGNOSIS — R5381 Other malaise: Secondary | ICD-10-CM | POA: Diagnosis not present

## 2023-01-18 DIAGNOSIS — I10 Essential (primary) hypertension: Secondary | ICD-10-CM | POA: Insufficient documentation

## 2023-01-18 HISTORY — PX: IR EXCHANGE BILIARY DRAIN: IMG6046

## 2023-01-18 MED ORDER — IOHEXOL 300 MG/ML  SOLN
4.0000 mL | Freq: Once | INTRAMUSCULAR | Status: AC | PRN
  Administered 2023-01-18: 4 mL

## 2023-01-18 MED ORDER — FENTANYL CITRATE (PF) 100 MCG/2ML IJ SOLN
INTRAMUSCULAR | Status: AC | PRN
  Administered 2023-01-18: 50 ug via INTRAVENOUS
  Administered 2023-01-18: 25 ug via INTRAVENOUS

## 2023-01-18 MED ORDER — SODIUM CHLORIDE 0.9% FLUSH: 5.0000 mL | Freq: Three times a day (TID) | INTRAVENOUS | Status: DC

## 2023-01-18 MED ORDER — FENTANYL CITRATE (PF) 100 MCG/2ML IJ SOLN
INTRAMUSCULAR | Status: AC
  Filled 2023-01-18: qty 2

## 2023-01-18 MED ORDER — MIDAZOLAM HCL 2 MG/2ML IJ SOLN
INTRAMUSCULAR | Status: AC | PRN
  Administered 2023-01-18: 1 mg via INTRAVENOUS

## 2023-01-18 MED ORDER — SODIUM CHLORIDE 0.9 % IV SOLN: INTRAVENOUS | Status: DC

## 2023-01-18 MED ORDER — MIDAZOLAM HCL 2 MG/2ML IJ SOLN
INTRAMUSCULAR | Status: AC
  Filled 2023-01-18: qty 2

## 2023-01-18 MED ORDER — LIDOCAINE HCL 1 % IJ SOLN
8.0000 mL | Freq: Once | INTRAMUSCULAR | Status: AC
  Administered 2023-01-18: 8 mL via INTRADERMAL

## 2023-01-18 MED ORDER — LIDOCAINE HCL 1 % IJ SOLN
INTRAMUSCULAR | Status: AC
  Filled 2023-01-18: qty 20

## 2023-01-18 MED ORDER — SODIUM CHLORIDE 0.9 % IV SOLN
INTRAVENOUS | Status: AC | PRN
  Administered 2023-01-18: 2 g via INTRAVENOUS

## 2023-01-18 MED ORDER — SODIUM CHLORIDE 0.9 % IV SOLN
2.0000 g | INTRAVENOUS | Status: DC
  Filled 2023-01-18: qty 20

## 2023-01-18 NOTE — Progress Notes (Signed)
Patient clinically stable post 14 FR IR Bilary tube exchange with sedation per Dr Elby Showers, tolerated well. Vitals stable pre and post procedure. Received Versed 1 mg along with Fentanyl 75 mcg IV for procedure. Report given to [facility] RN post procedure/specials/9.

## 2023-01-18 NOTE — Procedures (Signed)
Interventional Radiology Procedure Note  Procedure: Biliary drain exchange  Findings: Please refer to procedural dictation for full description. 14 Fr biliary drain with approximately 7 cm of additional custom side holes cut, to bag drainage.  Complications: None immediate  Estimated Blood Loss: < 5 ml  Recommendations: Plan for routine biliary drain exchange in 8 weeks.   Marliss Coots, MD

## 2023-01-23 ENCOUNTER — Other Ambulatory Visit: Payer: Self-pay

## 2023-01-26 ENCOUNTER — Other Ambulatory Visit: Payer: Self-pay

## 2023-01-26 ENCOUNTER — Other Ambulatory Visit: Payer: Self-pay | Admitting: Medical Oncology

## 2023-01-26 ENCOUNTER — Other Ambulatory Visit (HOSPITAL_COMMUNITY): Payer: Self-pay

## 2023-01-26 DIAGNOSIS — C50912 Malignant neoplasm of unspecified site of left female breast: Secondary | ICD-10-CM

## 2023-01-26 MED ORDER — PALBOCICLIB 75 MG PO TABS
75.0000 mg | ORAL_TABLET | Freq: Every day | ORAL | 0 refills | Status: DC
Start: 2023-01-26 — End: 2023-02-23
  Filled 2023-01-26: qty 21, 28d supply, fill #0

## 2023-02-02 ENCOUNTER — Other Ambulatory Visit (HOSPITAL_COMMUNITY): Payer: Self-pay

## 2023-02-06 ENCOUNTER — Ambulatory Visit (INDEPENDENT_AMBULATORY_CARE_PROVIDER_SITE_OTHER): Payer: Medicare HMO

## 2023-02-06 VITALS — Ht 63.0 in | Wt 128.0 lb

## 2023-02-06 DIAGNOSIS — Z0000 Encounter for general adult medical examination without abnormal findings: Secondary | ICD-10-CM | POA: Diagnosis not present

## 2023-02-06 NOTE — Patient Instructions (Addendum)
Diamond Hinton , Thank you for taking time to come for your Medicare Wellness Visit. I appreciate your ongoing commitment to your health goals. Please review the following plan we discussed and let me know if I can assist you in the future.   Referrals/Orders/Follow-Ups/Clinician Recommendations: NONE  This is a list of the screening recommended for you and due dates:  Health Maintenance  Topic Date Due   Eye exam for diabetics  Never done   DTaP/Tdap/Td vaccine (1 - Tdap) Never done   Zoster (Shingles) Vaccine (1 of 2) Never done   Colon Cancer Screening  Never done   Mammogram  Never done   COVID-19 Vaccine (4 - 2023-24 season) 02/10/2022   Flu Shot  01/11/2023   Complete foot exam   04/21/2023   Hemoglobin A1C  04/22/2023   Pneumonia Vaccine  Completed   DEXA scan (bone density measurement)  Completed   Hepatitis C Screening  Completed   HPV Vaccine  Aged Out    Advanced directives: (In Chart) A copy of your advanced directives are scanned into your chart should your provider ever need it.  Next Medicare Annual Wellness Visit scheduled for next year: Yes 02/12/2024 @ 11:15am telephone

## 2023-02-06 NOTE — Progress Notes (Signed)
Subjective:   Diamond Hinton is a 67 y.o. female who presents for Medicare Annual (Subsequent) preventive examination.  Visit Complete: Virtual  I connected with  QUANAH SLINGER on 02/06/23 by a audio enabled telemedicine application and verified that I am speaking with the correct person using two identifiers.  Patient Location: Home  Provider Location: Office/Clinic  I discussed the limitations of evaluation and management by telemedicine. The patient expressed understanding and agreed to proceed.  Vital Signs: Unable to obtain new vitals due to this being a telehealth visit.  Patient Medicare AWV questionnaire was completed by the patient on (not done); I have confirmed that all information answered by patient is correct and no changes since this date.  Review of Systems    Cardiac Risk Factors include: advanced age (>2men, >25 women);diabetes mellitus;dyslipidemia;hypertension;sedentary lifestyle    Objective:    Today's Vitals   02/06/23 1133 02/06/23 1134  Weight: 128 lb (58.1 kg)   Height: 5\' 3"  (1.6 m)   PainSc:  1    Body mass index is 22.67 kg/m.     02/06/2023   11:53 AM 01/18/2023    7:44 AM 12/25/2022    6:31 PM 11/06/2022    4:27 AM 10/24/2022    1:36 PM 09/27/2022    1:41 PM 09/20/2022    1:25 PM  Advanced Directives  Does Patient Have a Medical Advance Directive? Yes Yes Yes Yes Yes Yes Yes  Type of Estate agent of [place];Living will Healthcare Power of [place];Living will Healthcare Power of [place];Living will Living will;Healthcare Power of [employer] Power of [place];Living will Healthcare Power of [place];Living will Living will;Healthcare Power of Attorney  Does patient want to make changes to medical advance directive?  No - Patient declined       Copy of Healthcare Power of Attorney in Chart?  Yes - validated most recent copy scanned in chart (See row information)         Current Medications (verified) Outpatient  Encounter Medications as of 02/06/2023  Medication Sig   acetaminophen (TYLENOL) 325 MG tablet Take 325 mg by mouth every 6 (six) hours as needed (for pain and sometimes takes 2 if pain is worse).   baclofen (LIORESAL) 10 MG tablet Take 10 mg by mouth 3 (three) times daily as needed.   letrozole (FEMARA) 2.5 MG tablet Take 1 tablet (2.5 mg total) by mouth daily.   levothyroxine (SYNTHROID) 88 MCG tablet Take 1 tablet by mouth once daily   Multiple Vitamins-Minerals (CENTRUM VITAMINTS PO) Take 1 tablet by mouth daily.   ondansetron (ZOFRAN) 4 MG tablet Take 1 tablet (4 mg total) by mouth daily as needed for nausea or vomiting.   oxyCODONE (ROXICODONE) 5 MG immediate release tablet Take 1 tablet (5 mg total) by mouth every 4 (four) hours as needed for severe pain.   palbociclib (IBRANCE) 75 MG tablet Take 1 tablet (75 mg total) by mouth daily. Take for 21 days on, 7 days off, repeat every 28 days.   pantoprazole (PROTONIX) 40 MG tablet Take 1 tablet (40 mg total) by mouth daily.   potassium chloride SA (KLOR-CON M) 20 MEQ tablet Take 1 tablet (20 mEq total) by mouth daily.   prochlorperazine (COMPAZINE) 5 MG tablet Take 1 tablet (5 mg total) by mouth every 6 (six) hours as needed for refractory nausea / vomiting.   traZODone (DESYREL) 50 MG tablet Take 50 mg by mouth at bedtime.   No facility-administered encounter medications on file  as of 02/06/2023.    Allergies (verified) Peanuts [peanut oil], Levaquin [levofloxacin], and Flexeril [cyclobenzaprine]   History: Past Medical History:  Diagnosis Date   Anemia    Anxiety    Breast cancer (HCC)    Gallstones    GERD (gastroesophageal reflux disease)    Hyperlipidemia    Hypertension    Jaundice 08/29/2021   Rash 08/29/2021   Right upper quadrant abdominal pain 08/29/2021   UTI (urinary tract infection) 08/29/2021   Past Surgical History:  Procedure Laterality Date   BILATERAL SALPINGECTOMY  04/03/1997   CHOLECYSTECTOMY N/A 07/07/2015    Procedure: LAPAROSCOPIC CHOLECYSTECTOMY ;  Surgeon: Leafy Ro, MD;  Location: [facility] ORS;  Service: General;  Laterality: N/A;   ESOPHAGOGASTRODUODENOSCOPY  08/30/2021   Procedure: ESOPHAGOGASTRODUODENOSCOPY (EGD);  Surgeon: Midge Minium, MD;  Location: [facility] END[facility];  Service: Endoscopy;;   GALLBLADDER SURGERY  07/07/2015   [facility]   IR BILIARY DILITATION  08/22/2022   IR BILIARY DRAIN PLACEMENT WITH CHOLANGIOGRAM  08/31/2021   IR BILIARY STENT(S) NEW ACCESS WITH DRAIN  02/08/2022   IR CHOLANGIOGRAM EXISTING TUBE  08/17/2022   IR CONVERT BILIARY DRAIN TO INT EXT BILIARY DRAIN  09/02/2021   IR EXCHANGE BILIARY DRAIN  10/10/2021   IR EXCHANGE BILIARY DRAIN  12/05/2021   IR EXCHANGE BILIARY DRAIN  01/19/2022   IR EXCHANGE BILIARY DRAIN  10/13/2022   IR EXCHANGE BILIARY DRAIN  11/24/2022   IR EXCHANGE BILIARY DRAIN  01/18/2023   IR INT EXT BILIARY DRAIN WITH CHOLANGIOGRAM  07/17/2022   IR RADIOLOGIST EVAL & MGMT  03/01/2022   IR RADIOLOGIST EVAL & MGMT  08/17/2022   Family History  Problem Relation Age of Onset   Hypertension Mother    CVA Mother    Ulcers Mother    Emphysema Mother    Heart disease Father    Prostate cancer Father    Macular degeneration Father    Hypertension Sister    Diabetes Sister    Cervical polyp Sister    Hypertension Sister    Diabetes Sister    Lymphoma Sister    Social History   Socioeconomic History   Marital status: Married    Spouse name: [name]   Number of children: 4   Years of education: Not on file   Highest education level: Not on file  Occupational History   Not on file  Tobacco Use   Smoking status: Never   Smokeless tobacco: Never  Vaping Use   Vaping status: Never Used  Substance and Sexual Activity   Alcohol use: No   Drug use: No   Sexual activity: Yes  Other Topics Concern   Not on file  Social History Narrative   Lives with spouse at home: [name] has Parkinson's    Social Determinants of Health   Financial Resource Strain: Low Risk   (02/06/2023)   Overall Financial Resource Strain (CARDIA)    Difficulty of Paying Living Expenses: Not hard at all  Food Insecurity: No Food Insecurity (02/06/2023)   Hunger Vital Sign    Worried About Running Out of Food in the Last Year: Never true    Ran Out of Food in the Last Year: Never true  Transportation Needs: No Transportation Needs (02/06/2023)   PRAPARE - Administrator, Civil Service (Medical): No    Lack of Transportation (Non-Medical): No  Physical Activity: Inactive (02/06/2023)   Exercise Vital Sign    Days of Exercise per Week: 0 days  Minutes of Exercise per Session: 0 min  Stress: No Stress Concern Present (11/24/2021)   [employer] of Occupational Health - Occupational Stress Questionnaire    Feeling of Stress : Only a little  Social Connections: Moderately Isolated (02/06/2023)   Social Connection and Isolation Panel [NHANES]    Frequency of Communication with Friends and Family: More than three times a week    Frequency of Social Gatherings with Friends and Family: Never    Attends Religious Services: Never    Database administrator or Organizations: No    Attends Engineer, structural: Never    Marital Status: Married    Tobacco Counseling Counseling given: Not Answered  Clinical Intake:  Pre-visit preparation completed: Yes  Pain : 0-10 Pain Score: 1  Pain Type: Chronic pain (drainage bag on rt side since feb) Pain Location: Incision Pain Orientation: Right Pain Descriptors / Indicators: Aching Pain Onset: More than a month ago Pain Frequency: Intermittent Pain Relieving Factors: nothing mostly:tylenol every blue moon if gets bad  Pain Relieving Factors: nothing mostly:tylenol every blue moon if gets bad  BMI - recorded: 22.67 Nutritional Status: BMI of 19-24  Normal Nutritional Risks: Nausea/ vomitting/ diarrhea (chemotherapy:has Zofran and Compazine) Diabetes: Yes CBG done?: No Did pt. bring in CBG monitor from  home?: No  How often do you need to have someone help you when you read instructions, pamphlets, or other written materials from your doctor or pharmacy?: 1 - Never  Interpreter Needed?: No  Comments: lives with husband Information entered by :: B.Marshayla Mitschke,LPN   Activities of Daily Living    02/06/2023   11:53 AM 01/18/2023    7:41 AM  In your present state of health, do you have any difficulty performing the following activities:  Hearing? 0 0  Vision? 1 1  Difficulty concentrating or making decisions? 0 0  Walking or climbing stairs? 0 0  Dressing or bathing? 0 0  Doing errands, shopping? 0   Preparing Food and eating ? N   Using the Toilet? N   In the past six months, have you accidently leaked urine? N   Do you have problems with loss of bowel control? N   Managing your Medications? N   Managing your Finances? N   Housekeeping or managing your Housekeeping? Y   Comment husband does home mange     Patient Care Team: Bacigalupo, Marzella Schlein, MD as PCP - General (Family Medicine) Scarlett Presto, RN (Inactive) as Oncology Nurse Navigator Creig Hines, MD as Consulting Physician (Oncology)  Indicate any recent Medical Services you may have received from other than [facility] providers in the past year (date may be approximate).     Assessment:   This is a routine wellness examination for [place].  Hearing/Vision screen Hearing Screening - Comments:: Adequate hearing Vision Screening - Comments:: Adequate vision w/contacts Dr Alvester Morin  Dietary issues and exercise activities discussed:     Goals Addressed             This Visit's Progress    Exercise 150 minutes per week (moderate activity)   Not on track    Peak Blood Glucose < 180   No change     Depression Screen    02/06/2023   11:47 AM 01/04/2023    9:03 AM 10/20/2022   10:32 AM 08/14/2022    2:30 PM 07/24/2022   10:33 AM 04/20/2022    3:46 PM 10/18/2021    8:37 AM  PHQ 2/9 Scores  PHQ - 2 Score 2 2 2 2 2  0 2  PHQ-  9 Score 8 5 5 3 3 3 4     Fall Risk    02/06/2023   11:41 AM 01/04/2023    9:03 AM 10/20/2022   10:32 AM 08/14/2022    2:30 PM 07/24/2022   10:32 AM  Fall Risk   Falls in the past year? 0 0 0 0 0  Number falls in past yr: 0 0 0 0 0  Injury with Fall? 0 0 0 0 0  Risk for fall due to : History of fall(s) No Fall Risks No Fall Risks No Fall Risks No Fall Risks  Follow up Education provided;Falls prevention discussed Falls evaluation completed Falls evaluation completed Falls evaluation completed Falls evaluation completed    MEDICARE RISK AT HOME: Medicare Risk at Home Any stairs in or around the home?: Yes If so, are there any without handrails?: Yes Home free of loose throw rugs in walkways, pet beds, electrical cords, etc?: Yes Adequate lighting in your home to reduce risk of falls?: Yes Life alert?: No Use of a cane, walker or w/c?: No Grab bars in the bathroom?: Yes Shower chair or bench in shower?: No Elevated toilet seat or a handicapped toilet?: No  TIMED UP AND GO:  Was the test performed?  No    Cognitive Function:        02/06/2023   11:54 AM 04/20/2022    3:47 PM  6CIT Screen  What Year? 0 points 0 points  What month? 0 points 0 points  What time? 0 points 0 points  Count back from 20 0 points 0 points  Months in reverse 0 points 0 points  Repeat phrase 0 points 0 points  Total Score 0 points 0 points    Immunizations Immunization History  Administered Date(s) Administered   PFIZER(Purple Top)SARS-COV-2 Vaccination 08/05/2019, 08/26/2019, 04/01/2020   PNEUMOCOCCAL CONJUGATE-20 04/18/2021    TDAP status: Up to date  Flu Vaccine status: Declined, Education has been provided regarding the importance of this vaccine but patient still declined. Advised may receive this vaccine at local pharmacy or Health Dept. Aware to provide a copy of the vaccination record if obtained from local pharmacy or Health Dept. Verbalized acceptance and understanding.  Pneumococcal  vaccine status: Up to date  Covid-19 vaccine status: Completed vaccines  Qualifies for Shingles Vaccine? Yes   Zostavax completed No   Shingrix Completed?: No.    Education has been provided regarding the importance of this vaccine. Patient has been advised to call insurance company to determine out of pocket expense if they have not yet received this vaccine. Advised may also receive vaccine at local pharmacy or Health Dept. Verbalized acceptance and understanding.  Screening Tests Health Maintenance  Topic Date Due   OPHTHALMOLOGY EXAM  Never done   DTaP/Tdap/Td (1 - Tdap) Never done   Zoster Vaccines- Shingrix (1 of 2) Never done   Colonoscopy  Never done   MAMMOGRAM  Never done   COVID-19 Vaccine (4 - 2023-24 season) 02/10/2022   INFLUENZA VACCINE  01/11/2023   FOOT EXAM  04/21/2023   HEMOGLOBIN A1C  04/22/2023   Pneumonia Vaccine 49+ Years old  Completed   DEXA SCAN  Completed   Hepatitis C Screening  Completed   HPV VACCINES  Aged Out    Health Maintenance  Health Maintenance Due  Topic Date Due   OPHTHALMOLOGY EXAM  Never done   DTaP/Tdap/Td (1 - Tdap) Never done  Zoster Vaccines- Shingrix (1 of 2) Never done   Colonoscopy  Never done   MAMMOGRAM  Never done   COVID-19 Vaccine (4 - 2023-24 season) 02/10/2022   INFLUENZA VACCINE  01/11/2023    Colorectal cancer screening: Type of screening: Colonoscopy. Completed NO. Repeat every 5-10 years PT DECLINES  Mammogram status: Completed NO. Repeat every year PT DECLINES  Bone Density status: Completed YES. Results reflect: Bone density results: NORMAL. Repeat every 5 years.  Lung Cancer Screening: (Low Dose CT Chest recommended if Age 75-80 years, 20 pack-year currently smoking OR have quit w/in 15years.) does not qualify.   Lung Cancer Screening Referral: NO  Additional Screening:  Hepatitis C Screening: does not qualify; Completed YES  Vision Screening: Recommended annual ophthalmology exams for early detection  of glaucoma and other disorders of the eye. Is the patient up to date with their annual eye exam?  Yes  Who is the provider or what is the name of the office in which the patient attends annual eye exams? Dr Alvester Morin If pt is not established with a provider, would they like to be referred to a provider to establish care? No .   Dental Screening: Recommended annual dental exams for proper oral hygiene  Diabetic Foot Exam: Diabetic Foot Exam: Completed yes  Community Resource Referral / Chronic Care Management: CRR required this visit?  No   CCM required this visit?  No    Plan:     I have personally reviewed and noted the following in the patient's chart:   Medical and social history Use of alcohol, tobacco or illicit drugs  Current medications and supplements including opioid prescriptions. Patient is currently taking opioid prescriptions. Information provided to patient regarding non-opioid alternatives. Patient advised to discuss non-opioid treatment plan with their provider. Functional ability and status Nutritional status Physical activity Advanced directives List of other physicians Hospitalizations, surgeries, and ER visits in previous 12 months Vitals Screenings to include cognitive, depression, and falls Referrals and appointments  In addition, I have reviewed and discussed with patient certain preventive protocols, quality metrics, and best practice recommendations. A written personalized care plan for preventive services as well as general preventive health recommendations were provided to patient.     Sue Lush, LPN   [id number]/[id number]   After Visit Summary: (MyChart) Due to this being a telephonic visit, the after visit summary with patients personalized plan was offered to patient via MyChart   Nurse Notes: Pt states she is doing alright with "good days and bad days" as she is currently receiving chemotherapy treatments for cancer. She has no concerns or questions at  this time.

## 2023-02-16 ENCOUNTER — Inpatient Hospital Stay: Payer: Medicare HMO | Attending: Oncology

## 2023-02-16 DIAGNOSIS — Z79899 Other long term (current) drug therapy: Secondary | ICD-10-CM

## 2023-02-16 DIAGNOSIS — C50912 Malignant neoplasm of unspecified site of left female breast: Secondary | ICD-10-CM

## 2023-02-16 DIAGNOSIS — Z170 Estrogen receptor positive status [ER+]: Secondary | ICD-10-CM | POA: Diagnosis not present

## 2023-02-16 DIAGNOSIS — C784 Secondary malignant neoplasm of small intestine: Secondary | ICD-10-CM | POA: Insufficient documentation

## 2023-02-16 DIAGNOSIS — C50919 Malignant neoplasm of unspecified site of unspecified female breast: Secondary | ICD-10-CM | POA: Insufficient documentation

## 2023-02-16 LAB — COMPREHENSIVE METABOLIC PANEL
ALT: 49 U/L — ABNORMAL HIGH (ref 0–44)
AST: 37 U/L (ref 15–41)
Albumin: 3.8 g/dL (ref 3.5–5.0)
Alkaline Phosphatase: 157 U/L — ABNORMAL HIGH (ref 38–126)
Anion gap: 9 (ref 5–15)
BUN: 23 mg/dL (ref 8–23)
CO2: 21 mmol/L — ABNORMAL LOW (ref 22–32)
Calcium: 9.4 mg/dL (ref 8.9–10.3)
Chloride: 104 mmol/L (ref 98–111)
Creatinine, Ser: 1 mg/dL (ref 0.44–1.00)
GFR, Estimated: >60 mL/min (ref >60)
Glucose, Bld: 121 mg/dL — ABNORMAL HIGH (ref 70–99)
Potassium: 3.9 mmol/L (ref 3.5–5.1)
Sodium: 134 mmol/L — ABNORMAL LOW (ref 135–145)
Total Bilirubin: 0.7 mg/dL (ref 0.3–1.2)
Total Protein: 7.6 g/dL (ref 6.5–8.1)

## 2023-02-16 LAB — CBC WITH DIFFERENTIAL/PLATELET
Abs Immature Granulocytes: 0.01 10*3/uL (ref 0.00–0.07)
Basophils Absolute: 0 10*3/uL (ref 0.0–0.1)
Basophils Relative: 1 %
Eosinophils Absolute: 0.1 10*3/uL (ref 0.0–0.5)
Eosinophils Relative: 1 %
HCT: 35.6 % — ABNORMAL LOW (ref 36.0–46.0)
Hemoglobin: 11.8 g/dL — ABNORMAL LOW (ref 12.0–15.0)
Immature Granulocytes: 0 %
Lymphocytes Relative: 25 %
Lymphs Abs: 1.4 10*3/uL (ref 0.7–4.0)
MCH: 30.6 pg (ref 26.0–34.0)
MCHC: 33.1 g/dL (ref 30.0–36.0)
MCV: 92.2 fL (ref 80.0–100.0)
Monocytes Absolute: 0.4 10*3/uL (ref 0.1–1.0)
Monocytes Relative: 8 %
Neutro Abs: 3.6 10*3/uL (ref 1.7–7.7)
Neutrophils Relative %: 65 %
Platelets: 222 10*3/uL (ref 150–400)
RBC: 3.86 MIL/uL — ABNORMAL LOW (ref 3.87–5.11)
RDW: 14.6 % (ref 11.5–15.5)
WBC: 5.5 10*3/uL (ref 4.0–10.5)
nRBC: 0 % (ref 0.0–0.2)

## 2023-02-16 LAB — MAGNESIUM: Magnesium: 2.2 mg/dL (ref 1.7–2.4)

## 2023-02-17 LAB — CANCER ANTIGEN 27.29: CA 27.29: 21.7 U/mL (ref 0.0–38.6)

## 2023-02-17 LAB — CANCER ANTIGEN 15-3: CA 15-3: 30.6 U/mL — ABNORMAL HIGH (ref 0.0–25.0)

## 2023-02-19 ENCOUNTER — Other Ambulatory Visit: Payer: Self-pay | Admitting: Interventional Radiology

## 2023-02-19 DIAGNOSIS — K831 Obstruction of bile duct: Secondary | ICD-10-CM

## 2023-02-23 ENCOUNTER — Other Ambulatory Visit: Payer: Self-pay | Admitting: Oncology

## 2023-02-23 ENCOUNTER — Other Ambulatory Visit (HOSPITAL_COMMUNITY): Payer: Self-pay

## 2023-02-23 ENCOUNTER — Other Ambulatory Visit: Payer: Self-pay

## 2023-02-23 DIAGNOSIS — C50912 Malignant neoplasm of unspecified site of left female breast: Secondary | ICD-10-CM

## 2023-02-23 MED ORDER — PALBOCICLIB 75 MG PO TABS
75.0000 mg | ORAL_TABLET | Freq: Every day | ORAL | 0 refills | Status: DC
Start: 2023-02-23 — End: 2023-03-27
  Filled 2023-02-23: qty 21, 28d supply, fill #0

## 2023-02-23 NOTE — Telephone Encounter (Signed)
Component Ref Range & Units 7 d ago (02/16/23) 1 mo ago (01/16/23) 2 mo ago (12/25/22) 2 mo ago (12/19/22) 3 mo ago (11/22/22) 3 mo ago (11/06/22) 4 mo ago (10/24/22)  WBC 4.0 - 10.5 K/uL 5.5 8.5 9.2 5.1 10.3 9.7 7.0  RBC 3.87 - 5.11 MIL/uL 3.86 Low  4.13 4.29 4.26 4.30 4.31 4.19  Hemoglobin 12.0 - 15.0 g/dL 82.9 Low  56.2 13.0 86.5 13.5 13.7 13.6  HCT 36.0 - 46.0 % 35.6 Low  38.1 39.8 40.9 39.8 40.2 39.8  MCV 80.0 - 100.0 fL 92.2 92.3 92.8 96.0 92.6 93.3 95.0  MCH 26.0 - 34.0 pg 30.6 30.8 31.7 30.8 31.4 31.8 32.5  MCHC 30.0 - 36.0 g/dL 78.4 69.6 29.5 28.4 13.2 34.1 34.2  RDW 11.5 - 15.5 % 14.6 13.6 13.7 13.6 13.1 13.6 14.2  Platelets 150 - 400 K/uL 222 467 High  406 High  372 467 High  220 326  nRBC 0.0 - 0.2 % 0.0 0.0 0.0 0.0 0.0 0.0 0.0  Neutrophils Relative % % 65 71 69 65 77 86 66  Neutro Abs 1.7 - 7.7 K/uL 3.6 6.2 6.5 3.3 8.0 High  8.3 High  4.6  Lymphocytes Relative % 25 20 23 29 17 6 27   Lymphs Abs 0.7 - 4.0 K/uL 1.4 1.7 2.1 1.5 1.8 0.6 Low  1.9  Monocytes Relative % 8 6 6 4 4 8 5   Monocytes Absolute 0.1 - 1.0 K/uL 0.4 0.5 0.5 0.2 0.4 0.7 0.3  Eosinophils Relative % 1 1 0 1 0 0 1  Eosinophils Absolute 0.0 - 0.5 K/uL 0.1 0.0 0.0 0.0 0.0 0.0 0.1  Basophils Relative % 1 1 1 1 1  0 1  Basophils Absolute 0.0 - 0.1 K/uL 0.0 0.1 0.1 0.1 0.1 0.0 0.1  Immature Granulocytes % 0 1 1 0 1 0 0  Abs Immature Granulocytes 0.00 - 0.07 K/uL 0.01 0.05 CM 0.06 CM 0.01 CM 0.05 CM 0.02 CM 0.02 CM  Comment: Performed at [facility], [address] [place], [place] [address]  Resulting Agency [facility]         Specimen Collected: 02/16/23 09:30 Last Resulted: 02/16/23 09:48              Component Ref Range & Units 7 d ago (02/16/23) 1 mo ago (01/16/23) 2 mo ago (12/25/22) 1 yr ago (11/02/21) 1 yr ago (11/01/21) 1 yr ago (10/08/21) 1 yr ago (10/05/21)  Magnesium 1.7 - 2.4 mg/dL 2.2 2.3 CM 1.6  Low  CM 1.9 CM 2.6 High  CM 1.1 Low  CM 2.1 CM     Component Ref Range & Units 7 d ago (02/16/23) 5 mo ago (09/20/22) 6 mo ago (08/21/22) 7 mo ago (07/10/22) 1 yr ago (01/04/22) 1 yr ago (11/21/21) 1 yr ago (09/03/21)  CA 15-3 0.0 - 25.0 U/mL 30.6 High  33.4 High  CM 32.8 High  CM 31.5 High  CM 24.9 CM 36.8 High  CM 43.3 High              Component Ref Range & Units 7 d ago 1 mo ago 2 mo ago 3 mo ago 5 mo ago 6 mo ago 7 mo ago  CA 27.29 0.0 - 38.6 U/mL 21.7 27.7 CM 25.5 CM 24.1 CM 25.2 CM 25.9 CM 28.4 CM  Component Ref Range & Units 7 d ago (02/16/23) 1 mo ago (01/16/23) 1 mo ago (01/04/23) 2 mo ago (12/25/22) 2 mo ago (12/19/22) 3 mo ago (11/22/22) 3 mo ago (11/06/22)  Sodium 135 - 145 mmol/L 134 Low  132 Low  139 R 132 Low  136 130 Low  134 Low   Potassium 3.5 - 5.1 mmol/L 3.9 4.8 4.7 R 3.3 Low  4.1 4.3 3.8  Chloride 98 - 111 mmol/L 104 100 101 R 106 100 99 109  CO2 22 - 32 mmol/L 21 Low  23 25 R 16 Low  26 20 Low  19 Low   Glucose, Bld 70 - 99 mg/dL 295 High  284 High  CM 125 High  228 High  CM 150 High  CM 150 High  CM 179 High  CM  Comment: Glucose reference range applies only to samples taken after fasting for at least 8 hours.  BUN 8 - 23 mg/dL 23 30 High  12 R 39 High  24 High  30 High  19  Creatinine, Ser 0.44 - 1.00 mg/dL 1.32 4.40 High  1.02 R 1.39 High  1.02 High  1.21 High  0.98  Calcium 8.9 - 10.3 mg/dL 9.4 9.4 9.2 R 8.2 Low  9.4 9.2 9.3  Total Protein 6.5 - 8.1 g/dL 7.6 8.3 High   7.4 8.1 8.4 High  7.6  Albumin 3.5 - 5.0 g/dL 3.8 3.9  4.0 4.1 4.1 4.0  AST 15 - 41 U/L 37 18  24 19  40 124 High   ALT 0 - 44 U/L 49 High  28  24 28  51 High  126 High   Alkaline Phosphatase 38 - 126 U/L 157 High  156 High   94 130 High  277 High  98  Total Bilirubin 0.3 - 1.2 mg/dL 0.7 0.7  0.7 0.5 1.1 1.6 High   GFR, Estimated >60 mL/min >60 49 Low  CM  41 Low  CM 60 Low  CM 49 Low  CM >60 CM  Comment: (NOTE) Calculated using the CKD-EPI Creatinine Equation (2021)   Anion gap 5 - 15 9 9  CM  10 CM 10 CM 11 CM 6 CM  Comment: Performed at [facility]: [facility] [address], [address] [place], [place] [address]  Resulting Agency [facility]

## 2023-03-06 ENCOUNTER — Ambulatory Visit
Admission: RE | Admit: 2023-03-06 | Discharge: 2023-03-06 | Disposition: A | Discharge status: Home / Self Care | Payer: Medicare HMO | Source: Ambulatory Visit | Attending: Interventional Radiology | Admitting: Interventional Radiology

## 2023-03-12 ENCOUNTER — Other Ambulatory Visit: Payer: Self-pay | Admitting: Student

## 2023-03-12 DIAGNOSIS — Z01812 Encounter for preprocedural laboratory examination: Secondary | ICD-10-CM

## 2023-03-12 NOTE — H&P (Signed)
Chief Complaint: Malignant biliary obstruction. Patient presents for repeat biliary drain exchange with conscious sedtion.   Referring Physician(s): Dr. Ashley Royalty  Supervising Physician: Marliss Coots  Patient Status: [facility] - Out-pt  History of Present Illness: Diamond Hinton is a 67 y.o. female Outpatient. 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 On 8.30.23 the surgery placed a covered common bile duct stent placement. Unfortunately the patient presented with recurrent biliary obstruction. IR placed an additional 12 Fr right- sided internal external biliary drain on 2.5.24. on 3.12.24 IR perform balloon angioplasty (10 mm balloon) and upside to 14 Fr. The biliary drain was aast exchanged on 8.9.24. Patient presents for biliary drain exchange with sedation.   Patient endorses pain at the exit site and states that she has intermittent leaking with flushing.Patient alert and laying in bed,calm. Denies any fevers, headache, chest pain, SOB, cough, abdominal pain, nausea, vomiting or bleeding. Return precautions and treatment recommendations and follow-up discussed with the patient  who is agreeable with the plan.     Past Medical History:  Diagnosis Date   Anemia    Anxiety    Breast cancer (HCC)    Gallstones    GERD (gastroesophageal reflux disease)    Hyperlipidemia    Hypertension    Jaundice 08/29/2021   Rash 08/29/2021   Right upper quadrant abdominal pain 08/29/2021   UTI (urinary tract infection) 08/29/2021    Past Surgical History:  Procedure Laterality Date   BILATERAL SALPINGECTOMY  04/03/1997   CHOLECYSTECTOMY N/A 07/07/2015   Procedure: LAPAROSCOPIC CHOLECYSTECTOMY ;  Surgeon: Leafy Ro, MD;  Location: [facility] ORS;  Service: General;  Laterality: N/A;   ESOPHAGOGASTRODUODENOSCOPY  08/30/2021   Procedure: ESOPHAGOGASTRODUODENOSCOPY (EGD);  Surgeon: Midge Minium,  MD;  Location: [facility] ENDOSCOPY;  Service: Endoscopy;;   GALLBLADDER SURGERY  07/07/2015   [facility]   IR BILIARY DILITATION  08/22/2022   IR BILIARY DRAIN PLACEMENT WITH CHOLANGIOGRAM  08/31/2021   IR BILIARY STENT(S) NEW ACCESS WITH DRAIN  02/08/2022   IR CHOLANGIOGRAM EXISTING TUBE  08/17/2022   IR CONVERT BILIARY DRAIN TO INT EXT BILIARY DRAIN  09/02/2021   IR EXCHANGE BILIARY DRAIN  10/10/2021   IR EXCHANGE BILIARY DRAIN  12/05/2021   IR EXCHANGE BILIARY DRAIN  01/19/2022   IR EXCHANGE BILIARY DRAIN  10/13/2022   IR EXCHANGE BILIARY DRAIN  11/24/2022   IR EXCHANGE BILIARY DRAIN  01/18/2023   IR INT EXT BILIARY DRAIN WITH CHOLANGIOGRAM  07/17/2022   IR RADIOLOGIST EVAL & MGMT  03/01/2022   IR RADIOLOGIST EVAL & MGMT  08/17/2022    Allergies: Peanuts [peanut oil], Levaquin [levofloxacin], and Flexeril [cyclobenzaprine]  Medications: Prior to Admission medications   Medication Sig Start Date End Date Taking? Authorizing Provider  acetaminophen (TYLENOL) 325 MG tablet Take 325 mg by mouth every 6 (six) hours as needed (for pain and sometimes takes 2 if pain is worse).    [provider]  baclofen (LIORESAL) 10 MG tablet Take 10 mg by mouth 3 (three) times daily as needed.    [provider]  letrozole (FEMARA) 2.5 MG tablet Take 1 tablet (2.5 mg total) by mouth daily. 11/02/22   Creig Hines, MD  levothyroxine (SYNTHROID) 88 MCG tablet Take 1 tablet by mouth once daily 12/20/22   Erasmo Downer, MD  Multiple Vitamins-Minerals (CENTRUM VITAMINTS PO) Take 1 tablet by mouth daily.    [provider]  ondansetron (ZOFRAN) 4 MG tablet Take 1 tablet (4 mg total) by mouth daily as needed for nausea or vomiting. 01/16/23 01/16/24  Creig Hines, MD  oxyCODONE (ROXICODONE) 5 MG immediate release tablet Take 1 tablet (5 mg total) by mouth every 4 (four) hours as needed for severe pain. 07/24/22   Bacigalupo, Marzella Schlein, MD  palbociclib Ilda Foil) 75 MG tablet Take 1 tablet (75 mg total) by  mouth daily. Take for 21 days on, 7 days off, repeat every 28 days. 02/23/23   Creig Hines, MD  pantoprazole (PROTONIX) 40 MG tablet Take 1 tablet (40 mg total) by mouth daily. 01/04/23   Bacigalupo, Marzella Schlein, MD  potassium chloride SA (KLOR-CON M) 20 MEQ tablet Take 1 tablet (20 mEq total) by mouth daily. 01/16/23   Creig Hines, MD  prochlorperazine (COMPAZINE) 5 MG tablet Take 1 tablet (5 mg total) by mouth every 6 (six) hours as needed for refractory nausea / vomiting. 02/14/22   Creig Hines, MD  traZODone (DESYREL) 50 MG tablet Take 50 mg by mouth at bedtime.    [provider]     Family History  Problem Relation Age of Onset   Hypertension Mother    CVA Mother    Ulcers Mother    Emphysema Mother    Heart disease Father    Prostate cancer Father    Macular degeneration Father    Hypertension Sister    Diabetes Sister    Cervical polyp Sister    Hypertension Sister    Diabetes Sister    Lymphoma Sister     Social History   Socioeconomic History   Marital status: Married    Spouse name: [name]   Number of children: 4   Years of education: Not on file   Highest education level: Not on file  Occupational History   Not on file  Tobacco Use   Smoking status: Never   Smokeless tobacco: Never  Vaping Use   Vaping status: Never Used  Substance and Sexual Activity   Alcohol use: No   Drug use: No   Sexual activity: Yes  Other Topics Concern   Not on file  Social History Narrative   Lives with spouse at home: [name] has Parkinson's. No indoor pets.    Social Determinants of Health   Financial Resource Strain: Low Risk  (02/06/2023)   Overall Financial Resource Strain (CARDIA)    Difficulty of Paying Living Expenses: Not hard at all  Food Insecurity: No Food Insecurity (02/06/2023)   Hunger Vital Sign    Worried About Running Out of Food in the Last Year: Never true    Ran Out of Food in the Last Year: Never true  Transportation Needs: No Transportation  Needs (02/06/2023)   PRAPARE - Administrator, Civil Service (Medical): No    Lack of Transportation (Non-Medical): No  Physical Activity: Inactive (02/06/2023)   Exercise Vital Sign    Days of Exercise per Week: 0 days    Minutes of Exercise per Session: 0 min  Stress: No Stress Concern Present (11/24/2021)   [employer] of Occupational Health - Occupational Stress Questionnaire    Feeling of Stress : Only a little  Social Connections: Moderately Isolated (02/06/2023)   Social Connection and Isolation Panel [NHANES]    Frequency of Communication with Friends and Family: More than three times a week    Frequency of Social Gatherings with Friends and Family: Never    Attends Religious  Services: Never    Database administrator or Organizations: No    Attends Banker Meetings: Never    Marital Status: Married    Review of Systems: A 12 point ROS discussed and pertinent positives are indicated in the HPI above.  All other systems are negative.  Review of Systems  Constitutional:  Negative for fatigue and fever.  HENT:  Negative for congestion.   Respiratory:  Negative for cough and shortness of breath.   Gastrointestinal:  Negative for abdominal pain, diarrhea, nausea and vomiting.    Vital Signs: There were no vitals taken for this visit.   Physical Exam Vitals and nursing note reviewed.  Constitutional:      Appearance: She is well-developed.  HENT:     Head: Normocephalic and atraumatic.  Eyes:     Conjunctiva/sclera: Conjunctivae normal.  Pulmonary:     Effort: Pulmonary effort is normal.  Musculoskeletal:        General: Normal range of motion.     Cervical back: Normal range of motion.  Skin:    General: Skin is warm.  Neurological:     Mental Status: She is alert and oriented to person, place, and time.     Imaging: No results found.  Labs:  CBC: Recent Labs    12/19/22 1039 12/25/22 1834 01/16/23 1241 02/16/23 0930  WBC  5.1 9.2 8.5 5.5  HGB 13.1 13.6 12.7 11.8*  HCT 40.9 39.8 38.1 35.6*  PLT 372 406* 467* 222    COAGS: Recent Labs    08/22/22 1125 10/13/22 1313 11/06/22 0520  INR 1.1 1.2 1.2  APTT  --   --  28    BMP: Recent Labs    12/19/22 1039 12/25/22 1834 01/04/23 0928 01/16/23 1241 02/16/23 0930  NA 136 132* 139 132* 134*  K 4.1 3.3* 4.7 4.8 3.9  CL 100 106 101 100 104  CO2 26 16* 25 23 21*  GLUCOSE 150* 228* 125* 152* 121*  BUN 24* 39* 12 30* 23  CALCIUM 9.4 8.2* 9.2 9.4 9.4  CREATININE 1.02* 1.39* 0.96 1.19* 1.00  GFRNONAA 60* 41*  --  49* >60    LIVER FUNCTION TESTS: Recent Labs    12/19/22 1039 12/25/22 1834 01/16/23 1241 02/16/23 0930  BILITOT 0.5 0.7 0.7 0.7  AST 19 24 18  37  ALT 28 24 28  49*  ALKPHOS 130* 94 156* 157*  PROT 8.1 7.4 8.3* 7.6  ALBUMIN 4.1 4.0 3.9 3.8     Assessment and Plan:  67 y.o female. Outpatient. 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 On 8.30.23 the surgery placed a covered common bile duct stent placement. Unfortunately the patient presented with recurrent biliary obstruction. IR placed an additional 12 Fr right- sided internal external biliary drain on 2.5.24. on 3.12.24 IR perform balloon angioplasty (10 mm balloon) and upside to 14 Fr. The biliary drain was aast exchanged on 8.9.24. Patient presents for biliary drain exchange with sedation.  No recent labs. All medications are within acceptable parameters. No pertinent allergies. Patient has been NPO since midnight.   Risks and benefits of biliary drain excange discussed with the patient including, but not limited to bleeding, infection which may lead to sepsis or even death and damage to adjacent structures.  This interventional procedure involves the use of X-rays and because of the nature of the planned procedure, it is possible that we will have prolonged  use of X-ray  fluoroscopy.  Potential radiation risks to you include (but are not limited to) the following: - A slightly elevated risk for cancer  several years later in life. This risk is typically less than 0.5% percent. This risk is low in comparison to the normal incidence of human cancer, which is 33% for women and 50% for men according to the American Cancer Society. - Radiation induced injury can include skin redness, resembling a rash, tissue breakdown / ulcers and hair loss (which can be temporary or permanent).   The likelihood of either of these occurring depends on the difficulty of the procedure and whether you are sensitive to radiation due to previous procedures, disease, or genetic conditions.   IF your procedure requires a prolonged use of radiation, you will be notified and given written instructions for further action.  It is your responsibility to monitor the irradiated area for the 2 weeks following the procedure and to notify your physician if you are concerned that you have suffered a radiation induced injury.    All of the patient's questions were answered, patient is agreeable to proceed.  Consent signed and in chart.   Thank you for this interesting consult.  I greatly enjoyed meeting Diamond Hinton and look forward to participating in their care.  A copy of this report was sent to the requesting provider on this date.  Electronically Signed: Alene Mires, NP 03/13/2023, 8:05 AM   I spent a total of    15 Minutes in face to face in clinical consultation, greater than 50% of which was counseling/coordinating care for biliary drain exchange.

## 2023-03-12 NOTE — Progress Notes (Signed)
Telephone encounter: 03/12/2023 at 1300.  Spoke with patient explaining to arrive at 0730, NPO after midnight, and have a driver present. Patient ensured understanding.

## 2023-03-13 ENCOUNTER — Encounter: Payer: Self-pay | Admitting: Radiology

## 2023-03-13 ENCOUNTER — Ambulatory Visit
Admission: RE | Admit: 2023-03-13 | Discharge: 2023-03-13 | Disposition: A | Discharge status: Home / Self Care | Payer: Medicare HMO | Source: Ambulatory Visit | Attending: Interventional Radiology | Admitting: Radiology

## 2023-03-13 ENCOUNTER — Other Ambulatory Visit: Payer: Self-pay

## 2023-03-13 DIAGNOSIS — Z452 Encounter for adjustment and management of vascular access device: Secondary | ICD-10-CM | POA: Diagnosis not present

## 2023-03-13 DIAGNOSIS — Z434 Encounter for attention to other artificial openings of digestive tract: Secondary | ICD-10-CM | POA: Diagnosis not present

## 2023-03-13 DIAGNOSIS — K831 Obstruction of bile duct: Secondary | ICD-10-CM | POA: Diagnosis not present

## 2023-03-13 DIAGNOSIS — Z853 Personal history of malignant neoplasm of breast: Secondary | ICD-10-CM | POA: Insufficient documentation

## 2023-03-13 DIAGNOSIS — Z9049 Acquired absence of other specified parts of digestive tract: Secondary | ICD-10-CM | POA: Insufficient documentation

## 2023-03-13 DIAGNOSIS — Z01812 Encounter for preprocedural laboratory examination: Secondary | ICD-10-CM

## 2023-03-13 HISTORY — PX: IR EXCHANGE BILIARY DRAIN: IMG6046

## 2023-03-13 LAB — GLUCOSE, CAPILLARY: Glucose-Capillary: 130 mg/dL — ABNORMAL HIGH (ref 70–99)

## 2023-03-13 MED ORDER — SODIUM CHLORIDE 0.9% FLUSH: 5.0000 mL | Freq: Three times a day (TID) | INTRAVENOUS | Status: DC

## 2023-03-13 MED ORDER — MIDAZOLAM HCL 2 MG/2ML IJ SOLN
INTRAMUSCULAR | Status: AC | PRN
  Administered 2023-03-13 (×2): 1 mg via INTRAVENOUS

## 2023-03-13 MED ORDER — FENTANYL CITRATE (PF) 100 MCG/2ML IJ SOLN
INTRAMUSCULAR | Status: AC
  Filled 2023-03-13: qty 2

## 2023-03-13 MED ORDER — SODIUM CHLORIDE 0.9 % IV SOLN
2.0000 g | INTRAVENOUS | Status: AC
  Administered 2023-03-13: 2 g via INTRAVENOUS
  Filled 2023-03-13: qty 20

## 2023-03-13 MED ORDER — LIDOCAINE HCL 1 % IJ SOLN
10.0000 mL | Freq: Once | INTRAMUSCULAR | Status: AC
  Administered 2023-03-13: 7 mL via INTRADERMAL

## 2023-03-13 MED ORDER — MIDAZOLAM HCL 2 MG/2ML IJ SOLN
INTRAMUSCULAR | Status: AC
  Filled 2023-03-13: qty 4

## 2023-03-13 MED ORDER — SODIUM CHLORIDE 0.9 % IV SOLN: INTRAVENOUS | Status: DC

## 2023-03-13 MED ORDER — LIDOCAINE HCL 1 % IJ SOLN
INTRAMUSCULAR | Status: AC
  Filled 2023-03-13: qty 20

## 2023-03-13 MED ORDER — IOHEXOL 300 MG/ML  SOLN
50.0000 mL | Freq: Once | INTRAMUSCULAR | Status: AC | PRN
  Administered 2023-03-13: 10 mL

## 2023-03-13 MED ORDER — FENTANYL CITRATE (PF) 100 MCG/2ML IJ SOLN
INTRAMUSCULAR | Status: AC | PRN
Start: 2023-03-13 — End: 2023-03-13
  Administered 2023-03-13: 25 ug via INTRAVENOUS
  Administered 2023-03-13: 50 ug via INTRAVENOUS

## 2023-03-13 NOTE — Procedures (Signed)
Interventional Radiology Procedure Note  Procedure: Biliary drain exchange  Findings: Please refer to procedural dictation for full description. 14 Fr biliary drain with 6 cm extra side holes, to bag drainage.  Complications: None immediate  Estimated Blood Loss: < 5 ml  Recommendations: Routine 6 week biliary drain exchanges with moderate sedation.   Marliss Coots, MD

## 2023-03-14 ENCOUNTER — Other Ambulatory Visit: Payer: Self-pay | Admitting: Interventional Radiology

## 2023-03-14 DIAGNOSIS — K831 Obstruction of bile duct: Secondary | ICD-10-CM

## 2023-03-18 ENCOUNTER — Other Ambulatory Visit: Payer: Self-pay | Admitting: Family Medicine

## 2023-03-18 DIAGNOSIS — E039 Hypothyroidism, unspecified: Secondary | ICD-10-CM

## 2023-03-19 NOTE — Telephone Encounter (Signed)
Requested medications are due for refill today.  yes  Requested medications are on the active medications list.  yes  Last refill. 12/20/2022 #90 0 rf  Future visit scheduled.   yes  Notes to clinic.  Abnormal labs.    Requested Prescriptions  Pending Prescriptions Disp Refills   levothyroxine (SYNTHROID) 88 MCG tablet [Pharmacy Med Name: Levothyroxine Sodium 88 MCG Oral Tablet] 90 tablet 0    Sig: Take 1 tablet by mouth once daily     Endocrinology:  Hypothyroid Agents Failed - 03/18/2023 11:38 AM      Failed - TSH in normal range and within 360 days    TSH  Date Value Ref Range Status  10/24/2022 4.777 (H) 0.350 - 4.500 uIU/mL Final    Comment:    Performed by a 3rd Generation assay with a functional sensitivity of <=0.01 uIU/mL. Performed at [facility], [address] [place], [place] [address]   03/21/2022 4.260 0.450 - 4.500 uIU/mL Final         Passed - Valid encounter within last 12 months    Recent Outpatient Visits           2 months ago Acute non-recurrent pansinusitis   [facility] [place], Marzella Schlein, MD   5 months ago Type 2 diabetes mellitus with other specified complication, without long-term current use of insulin [facility])   [facility] [place], Marzella Schlein, MD   7 months ago Hematuria, unspecified type   [place] [place], Marzella Schlein, MD   7 months ago Biliary stent obstruction, initial encounter   [facility] [place], Marzella Schlein, MD   10 months ago Palpitations   [place] [facility] [place], Marzella Schlein, MD       Future Appointments             In 1 month Bacigalupo, Marzella Schlein, MD [facility]

## 2023-03-20 NOTE — Telephone Encounter (Signed)
Can you check with her and see if anyone changed her dose or rechecked her thyroid after that abnormal?  It doesn't look like we checked it. A very least, we can refill her synthroid but have her retest TSH

## 2023-03-20 NOTE — Telephone Encounter (Signed)
Go ahead and refill the synthroid daily. Have her come for TSH this week to see where we are and then we can adjust as indicated by the new labs. Thanks!

## 2023-03-22 ENCOUNTER — Telehealth: Payer: Self-pay | Admitting: Family Medicine

## 2023-03-27 ENCOUNTER — Other Ambulatory Visit (HOSPITAL_COMMUNITY): Payer: Self-pay

## 2023-03-27 ENCOUNTER — Other Ambulatory Visit: Payer: Self-pay

## 2023-03-27 ENCOUNTER — Other Ambulatory Visit: Payer: Self-pay | Admitting: Oncology

## 2023-03-27 ENCOUNTER — Other Ambulatory Visit (HOSPITAL_COMMUNITY): Payer: Self-pay | Admitting: Pharmacy Technician

## 2023-03-27 DIAGNOSIS — C50912 Malignant neoplasm of unspecified site of left female breast: Secondary | ICD-10-CM

## 2023-03-27 MED ORDER — PALBOCICLIB 75 MG PO TABS
75.0000 mg | ORAL_TABLET | Freq: Every day | ORAL | 0 refills | Status: DC
Start: 2023-03-27 — End: 2023-04-24
  Filled 2023-03-27: qty 21, 21d supply, fill #0
  Filled 2023-03-29: qty 21, 28d supply, fill #0

## 2023-03-27 NOTE — Progress Notes (Signed)
Specialty Pharmacy Refill Coordination Note  Diamond Hinton is a 67 y.o. female contacted today regarding refills of specialty medication(s) Palbociclib   Patient requested Delivery   Delivery date: 04/04/23   Verified address: [address]  [place]   Medication will be filled on 04/03/23.

## 2023-03-27 NOTE — Telephone Encounter (Signed)
CBC with Differential/Platelet Order: [id number] Status: Final result     Visible to patient: Yes (seen)     Next appt: 04/24/2023 at 12:45 PM in Oncology ([facility])     Dx: High risk medication use; Breast canc...   0 Result Notes          Component Ref Range & Units 1 mo ago (02/16/23) 2 mo ago (01/16/23) 3 mo ago (12/25/22) 3 mo ago (12/19/22) 4 mo ago (11/22/22) 4 mo ago (11/06/22) 5 mo ago (10/24/22)  WBC 4.0 - 10.5 K/uL 5.5 8.5 9.2 5.1 10.3 9.7 7.0  RBC 3.87 - 5.11 MIL/uL 3.86 Low  4.13 4.29 4.26 4.30 4.31 4.19  Hemoglobin 12.0 - 15.0 g/dL 46.9 Low  62.9 52.8 41.3 13.5 13.7 13.6  HCT 36.0 - 46.0 % 35.6 Low  38.1 39.8 40.9 39.8 40.2 39.8  MCV 80.0 - 100.0 fL 92.2 92.3 92.8 96.0 92.6 93.3 95.0  MCH 26.0 - 34.0 pg 30.6 30.8 31.7 30.8 31.4 31.8 32.5  MCHC 30.0 - 36.0 g/dL 24.4 01.0 27.2 53.6 64.4 34.1 34.2  RDW 11.5 - 15.5 % 14.6 13.6 13.7 13.6 13.1 13.6 14.2  Platelets 150 - 400 K/uL 222 467 High  406 High  372 467 High  220 326  nRBC 0.0 - 0.2 % 0.0 0.0 0.0 0.0 0.0 0.0 0.0  Neutrophils Relative % % 65 71 69 65 77 86 66  Neutro Abs 1.7 - 7.7 K/uL 3.6 6.2 6.5 3.3 8.0 High  8.3 High  4.6  Lymphocytes Relative % 25 20 23 29 17 6 27   Lymphs Abs 0.7 - 4.0 K/uL 1.4 1.7 2.1 1.5 1.8 0.6 Low  1.9  Monocytes Relative % 8 6 6 4 4 8 5   Monocytes Absolute 0.1 - 1.0 K/uL 0.4 0.5 0.5 0.2 0.4 0.7 0.3  Eosinophils Relative % 1 1 0 1 0 0 1  Eosinophils Absolute 0.0 - 0.5 K/uL 0.1 0.0 0.0 0.0 0.0 0.0 0.1  Basophils Relative % 1 1 1 1 1  0 1  Basophils Absolute 0.0 - 0.1 K/uL 0.0 0.1 0.1 0.1 0.1 0.0 0.1  Immature Granulocytes % 0 1 1 0 1 0 0  Abs Immature Granulocytes 0.00 - 0.07 K/uL 0.01 0.05 CM 0.06 CM 0.01 CM 0.05 CM 0.02 CM 0.02 CM  Comment: Performed at [facility], [address] [place], [place] [address]  Resulting Agency [facility]         Specimen Collected: 02/16/23 09:30 Last  Resulted: 02/16/23 09:48      Lab Flowsheet      Order Details      View Encounter      Lab and Collection Details      Routing      Result History    View All Conversations on this Encounter      CM=Additional comments      Result Care Coordination   Patient Communication   Add Comments   Seen Back to Top    Other Results from 02/16/2023  Magnesium Order: [id number] Status: Final result      Visible to patient: Yes (seen)      Next appt: 04/24/2023 at 12:45 PM in Oncology ([facility])      Dx: High risk medication use; Breast canc...    0 Result Notes            Component  Ref Range & Units 1 mo ago (02/16/23) 2 mo ago (01/16/23) 3 mo ago (12/25/22) 1 yr ago (11/02/21) 1 yr ago (11/01/21) 1 yr ago (10/08/21) 1 yr ago (10/05/21)  Magnesium 1.7 - 2.4 mg/dL 2.2 2.3 CM 1.6 Low  CM 1.9 CM 2.6 High  CM 1.1 Low  CM 2.1 CM  Comment: Performed at [facility], [address] [place], [place] [address]  Resulting Agency [facility]         Specimen Collected: 02/16/23 09:30 Last Resulted: 02/16/23 09:56      Lab Flowsheet       Order Details       View Encounter       Lab and Collection Details       Routing       Result History     View All Conversations on this Encounter      CM=Additional comments      Result Care Coordination   Patient Communication   Add Comments   Seen Back to Top       Contains abnormal data Cancer antigen 15-3 Order: [id number] Status: Final result      Visible to patient: Yes (seen)      Next appt: 04/24/2023 at 12:45 PM in Oncology ([facility])      Dx: High risk medication use; Breast canc...    0 Result Notes            Component Ref Range & Units 1 mo ago (02/16/23) 6 mo ago (09/20/22) 7 mo ago (08/21/22) 8 mo ago (07/10/22) 1 yr ago (01/04/22) 1 yr ago (11/21/21) 1 yr ago (09/03/21)  CA 15-3 0.0 - 25.0 U/mL 30.6 High   33.4 High  CM 32.8 High  CM 31.5 High  CM 24.9 CM 36.8 High  CM 43.3 High  CM  Comment: (NOTE) Roche Diagnostics Electrochemiluminescence Immunoassay (ECLIA) Values obtained with different assay methods or kits cannot be used interchangeably.  Results cannot be interpreted as absolute evidence of the presence or absence of malignant disease. Performed At: [facility] [address] [place], [place] [id number] Jolene Schimke MD GU:[id number]  Resulting Agency [facility]         Specimen Collected: 02/16/23 09:30 Last Resulted: 02/17/23 17:36      Lab Flowsheet       Order Details       View Encounter       Lab and Collection Details       Routing       Result History     View All Conversations on this Encounter      CM=Additional comments      Result Care Coordination   Patient Communication   Add Comments   Seen Back to Top      Cancer antigen 27.29 Order: [id number] Status: Final result      Visible to patient: Yes (seen)      Next appt: 04/24/2023 at 12:45 PM in Oncology ([facility])      Dx: High risk medication use; Breast canc...    0 Result Notes            Component Ref Range & Units 1 mo ago 2 mo ago 3 mo ago 4 mo ago 6 mo ago  7 mo ago 8 mo ago  CA 27.29 0.0 - 38.6 U/mL 21.7 27.7 CM 25.5 CM 24.1 CM 25.2 CM 25.9 CM 28.4 CM  Comment: (NOTE) Siemens Centaur Immunochemiluminometric Methodology [facility]) Values obtained with different assay methods or kits cannot be used interchangeably. Results cannot be interpreted as absolute evidence of the presence or absence of malignant disease. Performed At: [facility] [address] [place], [place] [id number] Jolene Schimke MD YN:[id number]  Resulting Agency [facility]         Specimen Collected: 02/16/23 09:30 Last Resulted:  02/17/23 06:37      Lab Flowsheet       Order Details       View Encounter       Lab and Collection Details       Routing       Result History     View All Conversations on this Encounter      CM=Additional comments      Result Care Coordination   Patient Communication   Add Comments   Seen Back to Top       Contains abnormal data Comprehensive metabolic panel Order: [id number] Status: Final result      Visible to patient: Yes (seen)      Next appt: 04/24/2023 at 12:45 PM in Oncology ([facility])      Dx: High risk medication use; Breast canc...    0 Result Notes            Component Ref Range & Units 1 mo ago (02/16/23) 2 mo ago (01/16/23) 2 mo ago (01/04/23) 3 mo ago (12/25/22) 3 mo ago (12/19/22) 4 mo ago (11/22/22) 4 mo ago (11/06/22)  Sodium 135 - 145 mmol/L 134 Low  132 Low  139 R 132 Low  136 130 Low  134 Low   Potassium 3.5 - 5.1 mmol/L 3.9 4.8 4.7 R 3.3 Low  4.1 4.3 3.8  Chloride 98 - 111 mmol/L 104 100 101 R 106 100 99 109  CO2 22 - 32 mmol/L 21 Low  23 25 R 16 Low  26 20 Low  19 Low   Glucose, Bld 70 - 99 mg/dL 952 High  841 High  CM 125 High  228 High  CM 150 High  CM 150 High  CM 179 High  CM  Comment: Glucose reference range applies only to samples taken after fasting for at least 8 hours.  BUN 8 - 23 mg/dL 23 30 High  12 R 39 High  24 High  30 High  19  Creatinine, Ser 0.44 - 1.00 mg/dL 3.24 4.01 High  0.27 R 1.39 High  1.02 High  1.21 High  0.98  Calcium 8.9 - 10.3 mg/dL 9.4 9.4 9.2 R 8.2 Low  9.4 9.2 9.3  Total Protein 6.5 - 8.1 g/dL 7.6 8.3 High   7.4 8.1 8.4 High  7.6  Albumin 3.5 - 5.0 g/dL 3.8 3.9  4.0 4.1 4.1 4.0  AST 15 - 41 U/L 37 18  24 19  40 124 High   ALT 0 - 44 U/L 49 High  28  24 28  51 High  126 High   Alkaline Phosphatase 38 - 126 U/L 157 High  156 High   94 130 High  277 High  98  Total Bilirubin 0.3 - 1.2 mg/dL 0.7 0.7  0.7 0.5 1.1 1.6 High   GFR,  Estimated >60 mL/min >60 49 Low  CM  41 Low  CM 60 Low  CM 49  Low  CM >60 CM  Comment: (NOTE) Calculated using the CKD-EPI Creatinine Equation (2021)  Anion gap 5 - 15 9 9  CM  10 CM 10 CM 11 CM 6 CM  Comment: Performed at [facility], [address] [place], [place] [address]  Resulting Agency [facility]         Specimen Collected: 02/16/23 09:30 Last Resulted: 02/16/23 09:56

## 2023-03-28 ENCOUNTER — Other Ambulatory Visit (HOSPITAL_COMMUNITY): Payer: Self-pay

## 2023-03-29 ENCOUNTER — Other Ambulatory Visit (HOSPITAL_COMMUNITY): Payer: Self-pay

## 2023-04-03 ENCOUNTER — Other Ambulatory Visit: Payer: Self-pay

## 2023-04-03 ENCOUNTER — Other Ambulatory Visit (HOSPITAL_COMMUNITY): Payer: Self-pay

## 2023-04-13 ENCOUNTER — Other Ambulatory Visit: Payer: Self-pay

## 2023-04-21 NOTE — H&P (Signed)
Chief Complaint: Patient was seen in consultation today for biliary drain exchange.   Referring Physician(s): Dr. Varon Robert  Supervising Physician: Marliss Coots  Patient Status: [facility] - Out-pt  History of Present Illness: Diamond Hinton is a 67 y.o. 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 She is well-known to IR from initial biliary drain placement 08/31/21. A covered stent was placed into the common bile duct 02/08/22 but unfortunately she developed recurrent biliary obstruction. IR placed an additional 12 Fr right-sided internal/external biliary drain 07/17/22 with balloon angioplasty and drain upsize to a 14 Fr on 08/22/22.   The patient has been undergoing routine biliary drain exchanges since then. The drain exchanges are done under moderate sedation and the last drain exchange was 03/13/23.  She presents today for a routine 6-week exchange.   Past Medical History:  Diagnosis Date   Anemia    Anxiety    Breast cancer (HCC)    Gallstones    GERD (gastroesophageal reflux disease)    Hyperlipidemia    Hypertension    Jaundice 08/29/2021   Rash 08/29/2021   Right upper quadrant abdominal pain 08/29/2021   UTI (urinary tract infection) 08/29/2021    Past Surgical History:  Procedure Laterality Date   BILATERAL SALPINGECTOMY  04/03/1997   CHOLECYSTECTOMY N/A 07/07/2015   Procedure: LAPAROSCOPIC CHOLECYSTECTOMY ;  Surgeon: Leafy Ro, MD;  Location: [facility] ORS;  Service: General;  Laterality: N/A;   ESOPHAGOGASTRODUODENOSCOPY  08/30/2021   Procedure: ESOPHAGOGASTRODUODENOSCOPY (EGD);  Surgeon: Midge Minium, MD;  Location: [facility] ENDOSCOPY;  Service: Endoscopy;;   GALLBLADDER SURGERY  07/07/2015   [facility]   IR BILIARY DILITATION  08/22/2022   IR BILIARY DRAIN PLACEMENT WITH CHOLANGIOGRAM  08/31/2021   IR BILIARY STENT(S) NEW ACCESS WITH DRAIN  02/08/2022   IR CHOLANGIOGRAM  EXISTING TUBE  08/17/2022   IR CONVERT BILIARY DRAIN TO INT EXT BILIARY DRAIN  09/02/2021   IR EXCHANGE BILIARY DRAIN  10/10/2021   IR EXCHANGE BILIARY DRAIN  12/05/2021   IR EXCHANGE BILIARY DRAIN  01/19/2022   IR EXCHANGE BILIARY DRAIN  10/13/2022   IR EXCHANGE BILIARY DRAIN  11/24/2022   IR EXCHANGE BILIARY DRAIN  01/18/2023   IR EXCHANGE BILIARY DRAIN  03/13/2023   IR INT EXT BILIARY DRAIN WITH CHOLANGIOGRAM  07/17/2022   IR RADIOLOGIST EVAL & MGMT  03/01/2022   IR RADIOLOGIST EVAL & MGMT  08/17/2022    Allergies: Peanuts [peanut oil], Levaquin [levofloxacin], and Flexeril [cyclobenzaprine]  Medications: Prior to Admission medications   Medication Sig Start Date End Date Taking? Authorizing Provider  acetaminophen (TYLENOL) 325 MG tablet Take 325 mg by mouth every 6 (six) hours as needed (for pain and sometimes takes 2 if pain is worse).    [provider]  baclofen (LIORESAL) 10 MG tablet Take 10 mg by mouth 3 (three) times daily as needed.    [provider]  letrozole (FEMARA) 2.5 MG tablet Take 1 tablet (2.5 mg total) by mouth daily. 11/02/22   Creig Hines, MD  levothyroxine (SYNTHROID) 88 MCG tablet Take 1 tablet by mouth once daily 03/22/23   Erasmo Downer, MD  Multiple Vitamins-Minerals (CENTRUM VITAMINTS PO) Take 1 tablet by mouth daily.    [provider]  ondansetron (ZOFRAN) 4 MG tablet Take 1 tablet (4 mg total) by mouth daily as needed for nausea or vomiting. 01/16/23 01/16/24  Creig Hines, MD  oxyCODONE (ROXICODONE) 5 MG immediate release tablet Take 1 tablet (5 mg total) by mouth every 4 (four) hours as needed for severe pain. 07/24/22   Bacigalupo, Marzella Schlein, MD  palbociclib Ilda Foil) 75 MG tablet Take 1 tablet (75 mg total) by mouth daily. Take for 21 days on, 7 days off, repeat every 28 days. 03/27/23   Creig Hines, MD  pantoprazole (PROTONIX) 40 MG tablet Take 1 tablet (40 mg total) by mouth daily. 01/04/23   Bacigalupo, Marzella Schlein, MD  potassium  chloride SA (KLOR-CON M) 20 MEQ tablet Take 1 tablet (20 mEq total) by mouth daily. 01/16/23   Creig Hines, MD  prochlorperazine (COMPAZINE) 5 MG tablet Take 1 tablet (5 mg total) by mouth every 6 (six) hours as needed for refractory nausea / vomiting. 02/14/22   Creig Hines, MD  traZODone (DESYREL) 50 MG tablet Take 50 mg by mouth at bedtime.    [provider]     Family History  Problem Relation Age of Onset   Hypertension Mother    CVA Mother    Ulcers Mother    Emphysema Mother    Heart disease Father    Prostate cancer Father    Macular degeneration Father    Hypertension Sister    Diabetes Sister    Cervical polyp Sister    Hypertension Sister    Diabetes Sister    Lymphoma Sister     Social History   Socioeconomic History   Marital status: Married    Spouse name: [name]   Number of children: 4   Years of education: Not on file   Highest education level: Not on file  Occupational History   Not on file  Tobacco Use   Smoking status: Never   Smokeless tobacco: Never  Vaping Use   Vaping status: Never Used  Substance and Sexual Activity   Alcohol use: No   Drug use: No   Sexual activity: Yes  Other Topics Concern   Not on file  Social History Narrative   Lives with spouse at home: [name] has Parkinson's. No indoor pets.    Social Determinants of Health   Financial Resource Strain: Low Risk  (02/06/2023)   Overall Financial Resource Strain (CARDIA)    Difficulty of Paying Living Expenses: Not hard at all  Food Insecurity: No Food Insecurity (02/06/2023)   Hunger Vital Sign    Worried About Running Out of Food in the Last Year: Never true    Ran Out of Food in the Last Year: Never true  Transportation Needs: No Transportation Needs (02/06/2023)   PRAPARE - Administrator, Civil Service (Medical): No    Lack of Transportation (Non-Medical): No  Physical Activity: Inactive (02/06/2023)   Exercise Vital Sign    Days of Exercise per Week: 0  days    Minutes of Exercise per Session: 0 min  Stress: No Stress Concern Present (11/24/2021)   [employer] of Occupational Health - Occupational Stress Questionnaire    Feeling of Stress : Only a little  Social Connections: Moderately Isolated (02/06/2023)   Social Connection and Isolation Panel [NHANES]    Frequency of Communication with Friends and Family: More than three times a week    Frequency of Social Gatherings with Friends and Family: Never    Attends Religious Services: Never    Database administrator or Organizations: No    Attends Banker Meetings: Never    Marital Status: Married  Review of Systems: A 12 point ROS discussed and pertinent positives are indicated in the HPI above.  All other systems are negative.  Review of Systems  Constitutional:  Negative for appetite change and fatigue.  Respiratory:  Negative for cough and shortness of breath.   Cardiovascular:  Negative for chest pain and leg swelling.  Gastrointestinal:  Positive for nausea. Negative for abdominal pain, diarrhea and vomiting.  Neurological:  Negative for dizziness and headaches.    Vital Signs: There were no vitals taken for this visit.  Physical Exam Constitutional:      General: She is not in acute distress.    Appearance: She is not ill-appearing.  HENT:     Mouth/Throat:     Mouth: Mucous membranes are moist.     Pharynx: Oropharynx is clear.  Pulmonary:     Effort: Pulmonary effort is normal.  Abdominal:     Tenderness: There is no abdominal tenderness.     Comments: RUQ drain to gravity. Stat-lock and suture intact. Skin insertion site with mild erythema but is otherwise unremarkable. Approximately 20 ml of bile in gravity bag.   Neurological:     Mental Status: She is alert and oriented to person, place, and time.  Psychiatric:        Mood and Affect: Mood normal.        Behavior: Behavior normal.        Thought Content: Thought content normal.         Judgment: Judgment normal.     Imaging: No results found.  Labs:  CBC: Recent Labs    12/19/22 1039 12/25/22 1834 01/16/23 1241 02/16/23 0930  WBC 5.1 9.2 8.5 5.5  HGB 13.1 13.6 12.7 11.8*  HCT 40.9 39.8 38.1 35.6*  PLT 372 406* 467* 222    COAGS: Recent Labs    08/22/22 1125 10/13/22 1313 11/06/22 0520  INR 1.1 1.2 1.2  APTT  --   --  28    BMP: Recent Labs    12/19/22 1039 12/25/22 1834 01/04/23 0928 01/16/23 1241 02/16/23 0930  NA 136 132* 139 132* 134*  K 4.1 3.3* 4.7 4.8 3.9  CL 100 106 101 100 104  CO2 26 16* 25 23 21*  GLUCOSE 150* 228* 125* 152* 121*  BUN 24* 39* 12 30* 23  CALCIUM 9.4 8.2* 9.2 9.4 9.4  CREATININE 1.02* 1.39* 0.96 1.19* 1.00  GFRNONAA 60* 41*  --  49* >60    LIVER FUNCTION TESTS: Recent Labs    12/19/22 1039 12/25/22 1834 01/16/23 1241 02/16/23 0930  BILITOT 0.5 0.7 0.7 0.7  AST 19 24 18  37  ALT 28 24 28  49*  ALKPHOS 130* 94 156* 157*  PROT 8.1 7.4 8.3* 7.6  ALBUMIN 4.1 4.0 3.9 3.8    TUMOR MARKERS: No results for input(s): "AFPTM", "CEA", "CA199", "CHROMGRNA" in the last 8760 hours.  Assessment and Plan:  Metastatic breast cancer with biliary obstruction; chronic biliary drain: Diamond Hinton, 67 year old female, presents today to the [facility] Interventional Radiology department for a routine image-guided biliary drain exchange under moderate sedation.  Risks and benefits discussed with the patient including bleeding, infection, damage to adjacent structures, bowel perforation/fistula connection, and sepsis.  All of the patient's questions were answered, patient is agreeable to proceed. She has been NPO. She is a full code.   Consent signed and in chart.  Thank you for this interesting consult.  I greatly enjoyed meeting Diamond Hinton and look  forward to participating in their care.  A copy of this report was sent to the requesting provider on this date.  Electronically Signed: Alwyn Ren, AGACNP-BC [phone] 04/24/2023, 1:03 PM   I spent a total of  30 Minutes   in face to face in clinical consultation, greater than 50% of which was counseling/coordinating care for routine drain exchange.

## 2023-04-22 ENCOUNTER — Other Ambulatory Visit: Payer: Self-pay | Admitting: Oncology

## 2023-04-23 ENCOUNTER — Other Ambulatory Visit (HOSPITAL_COMMUNITY): Payer: Self-pay | Admitting: Radiology

## 2023-04-23 ENCOUNTER — Encounter: Payer: Medicare HMO | Admitting: Family Medicine

## 2023-04-23 DIAGNOSIS — K831 Obstruction of bile duct: Secondary | ICD-10-CM

## 2023-04-23 MED ORDER — SODIUM CHLORIDE 0.9 % IV SOLN
1.0000 g | Freq: Once | INTRAVENOUS | Status: DC
  Filled 2023-04-23: qty 10

## 2023-04-23 NOTE — Progress Notes (Signed)
Patient for IR Chole Tube Exchange on Tues 04/24/2023, I called and spoke with the patient on the phone and gave pre-procedure instructions. Pt was made aware to be here at 12p, NPO after MN prior to procedure as well as driver post procedure/recovery/discharge. Pt stated understanding.  Called 04/18/2023

## 2023-04-24 ENCOUNTER — Other Ambulatory Visit (HOSPITAL_COMMUNITY): Payer: Self-pay | Admitting: Pharmacy Technician

## 2023-04-24 ENCOUNTER — Other Ambulatory Visit (HOSPITAL_COMMUNITY): Payer: Self-pay

## 2023-04-24 ENCOUNTER — Ambulatory Visit: Payer: Medicare HMO | Admitting: Oncology

## 2023-04-24 ENCOUNTER — Ambulatory Visit
Admission: RE | Admit: 2023-04-24 | Discharge: 2023-04-24 | Disposition: A | Discharge status: Home / Self Care | Payer: Medicare HMO | Source: Ambulatory Visit | Attending: Interventional Radiology | Admitting: Radiology

## 2023-04-24 ENCOUNTER — Other Ambulatory Visit: Payer: Self-pay | Admitting: Oncology

## 2023-04-24 ENCOUNTER — Other Ambulatory Visit: Payer: Self-pay | Admitting: Interventional Radiology

## 2023-04-24 ENCOUNTER — Other Ambulatory Visit: Payer: Medicare HMO

## 2023-04-24 DIAGNOSIS — E119 Type 2 diabetes mellitus without complications: Secondary | ICD-10-CM | POA: Insufficient documentation

## 2023-04-24 DIAGNOSIS — Z434 Encounter for attention to other artificial openings of digestive tract: Secondary | ICD-10-CM | POA: Insufficient documentation

## 2023-04-24 DIAGNOSIS — I10 Essential (primary) hypertension: Secondary | ICD-10-CM | POA: Diagnosis not present

## 2023-04-24 DIAGNOSIS — K831 Obstruction of bile duct: Secondary | ICD-10-CM | POA: Insufficient documentation

## 2023-04-24 DIAGNOSIS — Z853 Personal history of malignant neoplasm of breast: Secondary | ICD-10-CM | POA: Diagnosis not present

## 2023-04-24 DIAGNOSIS — C50912 Malignant neoplasm of unspecified site of left female breast: Secondary | ICD-10-CM

## 2023-04-24 HISTORY — PX: IR EXCHANGE BILIARY DRAIN: IMG6046

## 2023-04-24 MED ORDER — SODIUM CHLORIDE 0.9 % IV SOLN: INTRAVENOUS | Status: DC

## 2023-04-24 MED ORDER — LIDOCAINE HCL 1 % IJ SOLN
10.0000 mL | Freq: Once | INTRAMUSCULAR | Status: AC
  Administered 2023-04-24: 3 mL via INTRADERMAL

## 2023-04-24 MED ORDER — IOHEXOL 300 MG/ML  SOLN
50.0000 mL | Freq: Once | INTRAMUSCULAR | Status: AC | PRN
  Administered 2023-04-24: 5 mL

## 2023-04-24 MED ORDER — MIDAZOLAM HCL 2 MG/2ML IJ SOLN
INTRAMUSCULAR | Status: AC | PRN
Start: 2023-04-24 — End: 2023-04-24
  Administered 2023-04-24: 1 mg via INTRAVENOUS

## 2023-04-24 MED ORDER — FENTANYL CITRATE (PF) 100 MCG/2ML IJ SOLN
INTRAMUSCULAR | Status: AC | PRN
  Administered 2023-04-24 (×2): 50 ug via INTRAVENOUS

## 2023-04-24 MED ORDER — PALBOCICLIB 75 MG PO TABS
75.0000 mg | ORAL_TABLET | Freq: Every day | ORAL | 0 refills | Status: DC
Start: 2023-04-24 — End: 2023-05-17
  Filled 2023-04-24: qty 21, 28d supply, fill #0

## 2023-04-24 MED ORDER — MIDAZOLAM HCL 2 MG/2ML IJ SOLN
INTRAMUSCULAR | Status: AC
  Filled 2023-04-24: qty 2

## 2023-04-24 MED ORDER — LIDOCAINE HCL 1 % IJ SOLN
INTRAMUSCULAR | Status: AC
Start: 2023-04-24 — End: ?
  Filled 2023-04-24: qty 20

## 2023-04-24 MED ORDER — FENTANYL CITRATE (PF) 100 MCG/2ML IJ SOLN
INTRAMUSCULAR | Status: AC
  Filled 2023-04-24: qty 2

## 2023-04-24 NOTE — Procedures (Signed)
Interventional Radiology Procedure Note  Procedure: Biliary drain exchange   Findings: Please refer to procedural dictation for full description. 14 Fr with 7 cm of additional side holes cut.  Complications: None immediate  Estimated Blood Loss: < 5 ml  Recommendations: Keep to bag drainage as per usual. Plan for routine 6 week exchanges with moderate sedation.   Marliss Coots, MD

## 2023-04-24 NOTE — Progress Notes (Signed)
Specialty Pharmacy Refill Coordination Note  Diamond Hinton is a 67 y.o. female contacted today regarding refills of specialty medication(s) Palbociclib   Patient requested Delivery   Delivery date: 05/02/23   Verified address: [address]  [place]   Medication will be filled on 05/01/23.  Refill request sent to MD; call if any delays

## 2023-04-24 NOTE — Addendum Note (Signed)
Encounter addended by: Assunta Found, RT on: 04/24/2023 12:06 PM  Actions taken: Imaging Exam ended

## 2023-04-24 NOTE — Addendum Note (Signed)
Encounter addended by: Zettie Cooley, RT on: 04/24/2023 1:06 PM  Actions taken: Imaging Exam ended

## 2023-04-24 NOTE — Telephone Encounter (Signed)
CBC with Differential/Platelet Order: [id number] Status: Final result     Visible to patient: Yes (seen)     Next appt: Today at 01:00 PM in Radiology (AR-IR)     Dx: High risk medication use; Breast canc...   0 Result Notes          Component Ref Range & Units 2 mo ago (02/16/23) 3 mo ago (01/16/23) 4 mo ago (12/25/22) 4 mo ago (12/19/22) 5 mo ago (11/22/22) 5 mo ago (11/06/22) 6 mo ago (10/24/22)  WBC 4.0 - 10.5 K/uL 5.5 8.5 9.2 5.1 10.3 9.7 7.0  RBC 3.87 - 5.11 MIL/uL 3.86 Low  4.13 4.29 4.26 4.30 4.31 4.19  Hemoglobin 12.0 - 15.0 g/dL 19.1 Low  47.8 29.5 62.1 13.5 13.7 13.6  HCT 36.0 - 46.0 % 35.6 Low  38.1 39.8 40.9 39.8 40.2 39.8  MCV 80.0 - 100.0 fL 92.2 92.3 92.8 96.0 92.6 93.3 95.0  MCH 26.0 - 34.0 pg 30.6 30.8 31.7 30.8 31.4 31.8 32.5  MCHC 30.0 - 36.0 g/dL 30.8 65.7 84.6 96.2 95.2 34.1 34.2  RDW 11.5 - 15.5 % 14.6 13.6 13.7 13.6 13.1 13.6 14.2  Platelets 150 - 400 K/uL 222 467 High  406 High  372 467 High  220 326  nRBC 0.0 - 0.2 % 0.0 0.0 0.0 0.0 0.0 0.0 0.0  Neutrophils Relative % % 65 71 69 65 77 86 66  Neutro Abs 1.7 - 7.7 K/uL 3.6 6.2 6.5 3.3 8.0 High  8.3 High  4.6  Lymphocytes Relative % 25 20 23 29 17 6 27   Lymphs Abs 0.7 - 4.0 K/uL 1.4 1.7 2.1 1.5 1.8 0.6 Low  1.9  Monocytes Relative % 8 6 6 4 4 8 5   Monocytes Absolute 0.1 - 1.0 K/uL 0.4 0.5 0.5 0.2 0.4 0.7 0.3  Eosinophils Relative % 1 1 0 1 0 0 1  Eosinophils Absolute 0.0 - 0.5 K/uL 0.1 0.0 0.0 0.0 0.0 0.0 0.1  Basophils Relative % 1 1 1 1 1  0 1  Basophils Absolute 0.0 - 0.1 K/uL 0.0 0.1 0.1 0.1 0.1 0.0 0.1  Immature Granulocytes % 0 1 1 0 1 0 0  Abs Immature Granulocytes 0.00 - 0.07 K/uL 0.01 0.05 CM 0.06 CM 0.01 CM 0.05 CM 0.02 CM 0.02 CM  Comment: Performed at [facility], [address] [place], [place] [address]  Resulting Agency [facility]         Specimen Collected: 02/16/23 09:30 Last Resulted:  02/16/23 09:48      Lab Flowsheet      Order Details      View Encounter      Lab and Collection Details      Routing      Result History    View All Conversations on this Encounter      CM=Additional comments      Result Care Coordination   Patient Communication   Add Comments   Seen Back to Top    Other Results from 02/16/2023  Magnesium Order: [id number] Status: Final result      Visible to patient: Yes (seen)      Next appt: Today at 01:00 PM in Radiology ([facility]-IR)      Dx: High risk medication use; Breast canc...    0 Result Notes            Component Ref Range &  Units 2 mo ago (02/16/23) 3 mo ago (01/16/23) 4 mo ago (12/25/22) 1 yr ago (11/02/21) 1 yr ago (11/01/21) 1 yr ago (10/08/21) 1 yr ago (10/05/21)  Magnesium 1.7 - 2.4 mg/dL 2.2 2.3 CM 1.6 Low  CM 1.9 CM 2.6 High  CM 1.1 Low  CM 2.1 CM  Comment: Performed at [facility], [address] [place], [place] [address]  Resulting Agency [facility]         Specimen Collected: 02/16/23 09:30 Last Resulted: 02/16/23 09:56      Lab Flowsheet       Order Details       View Encounter       Lab and Collection Details       Routing       Result History     View All Conversations on this Encounter      CM=Additional comments      Result Care Coordination   Patient Communication   Add Comments   Seen Back to Top       Contains abnormal data Cancer antigen 15-3 Order: [id number] Status: Final result      Visible to patient: Yes (seen)      Next appt: Today at 01:00 PM in Radiology (AR-IR)      Dx: High risk medication use; Breast canc...    0 Result Notes            Component Ref Range & Units 2 mo ago (02/16/23) 7 mo ago (09/20/22) 8 mo ago (08/21/22) 9 mo ago (07/10/22) 1 yr ago (01/04/22) 1 yr ago (11/21/21) 1 yr ago (09/03/21)  CA 15-3 0.0 - 25.0 U/mL 30.6 High  33.4 High  CM 32.8 High  CM  31.5 High  CM 24.9 CM 36.8 High  CM 43.3 High  CM  Comment: (NOTE) Roche [employer] Electrochemiluminescence Immunoassay (ECLIA) Values obtained with different assay methods or kits cannot be used interchangeably.  Results cannot be interpreted as absolute evidence of the presence or absence of malignant disease. Performed At: [facility] [address] [place], [place] [id number] Jolene Schimke MD HK:[id number]  Resulting Agency [facility]         Specimen Collected: 02/16/23 09:30 Last Resulted: 02/17/23 17:36      Lab Flowsheet       Order Details       View Encounter       Lab and Collection Details       Routing       Result History     View All Conversations on this Encounter      CM=Additional comments      Result Care Coordination   Patient Communication   Add Comments   Seen Back to Top      Cancer antigen 27.29 Order: [id number] Status: Final result      Visible to patient: Yes (seen)      Next appt: Today at 01:00 PM in Radiology (AR-IR)      Dx: High risk medication use; Breast canc...    0 Result Notes            Component Ref Range & Units 2 mo ago 3 mo ago 4 mo ago 5 mo ago 7 mo ago 8 mo ago 9 mo  ago  CA 27.29 0.0 - 38.6 U/mL 21.7 27.7 CM 25.5 CM 24.1 CM 25.2 CM 25.9 CM 28.4 CM  Comment: (NOTE) Siemens Centaur Immunochemiluminometric Methodology [facility]) Values obtained with different assay methods or kits cannot be used interchangeably. Results cannot be interpreted as absolute evidence of the presence or absence of malignant disease. Performed At: [facility] [address] [place], [place] [id number] Jolene Schimke MD WU:[id number]  Resulting Agency [facility] LAB         Specimen Collected: 02/16/23 09:30 Last Resulted: 02/17/23 06:37    ntains abnormal data  Comprehensive metabolic panel Order: [id number] Status: Final result     Visible to patient: Yes (seen)     Next appt: Today at 01:00 PM in Radiology (AR-IR)     Dx: High risk medication use; Breast canc...   0 Result Notes          Component Ref Range & Units 2 mo ago (02/16/23) 3 mo ago (01/16/23) 3 mo ago (01/04/23) 4 mo ago (12/25/22) 4 mo ago (12/19/22) 5 mo ago (11/22/22) 5 mo ago (11/06/22)  Sodium 135 - 145 mmol/L 134 Low  132 Low  139 R 132 Low  136 130 Low  134 Low   Potassium 3.5 - 5.1 mmol/L 3.9 4.8 4.7 R 3.3 Low  4.1 4.3 3.8  Chloride 98 - 111 mmol/L 104 100 101 R 106 100 99 109  CO2 22 - 32 mmol/L 21 Low  23 25 R 16 Low  26 20 Low  19 Low   Glucose, Bld 70 - 99 mg/dL 578 High  469 High  CM 125 High  228 High  CM 150 High  CM 150 High  CM 179 High  CM  Comment: Glucose reference range applies only to samples taken after fasting for at least 8 hours.  BUN 8 - 23 mg/dL 23 30 High  12 R 39 High  24 High  30 High  19  Creatinine, Ser 0.44 - 1.00 mg/dL 6.29 5.28 High  4.13 R 1.39 High  1.02 High  1.21 High  0.98  Calcium 8.9 - 10.3 mg/dL 9.4 9.4 9.2 R 8.2 Low  9.4 9.2 9.3  Total Protein 6.5 - 8.1 g/dL 7.6 8.3 High   7.4 8.1 8.4 High  7.6  Albumin 3.5 - 5.0 g/dL 3.8 3.9  4.0 4.1 4.1 4.0  AST 15 - 41 U/L 37 18  24 19  40 124 High   ALT 0 - 44 U/L 49 High  28  24 28  51 High  126 High   Alkaline Phosphatase 38 - 126 U/L 157 High  156 High   94 130 High  277 High  98  Total Bilirubin 0.3 - 1.2 mg/dL 0.7 0.7  0.7 0.5 1.1 1.6 High   GFR, Estimated >60 mL/min >60 49 Low  CM  41 Low  CM 60 Low  CM 49 Low  CM >60 CM  Comment: (NOTE) Calculated using the CKD-EPI Creatinine Equation (2021)  Anion gap 5 - 15 9 9  CM  10 CM 10 CM 11 CM 6 CM  Comment: Performed at [facility], [address] [place], [place] [address]  Resulting Agency [facility]         Specimen Collected: 02/16/23 09:30 Last  Resulted:  02/16/23 09:56        

## 2023-05-01 ENCOUNTER — Other Ambulatory Visit (HOSPITAL_COMMUNITY): Payer: Self-pay

## 2023-05-01 ENCOUNTER — Other Ambulatory Visit: Payer: Self-pay

## 2023-05-01 DIAGNOSIS — C50912 Malignant neoplasm of unspecified site of left female breast: Secondary | ICD-10-CM

## 2023-05-02 ENCOUNTER — Inpatient Hospital Stay: Payer: Medicare HMO | Attending: Oncology

## 2023-05-02 ENCOUNTER — Encounter: Payer: Self-pay | Admitting: Oncology

## 2023-05-02 ENCOUNTER — Inpatient Hospital Stay (HOSPITAL_BASED_OUTPATIENT_CLINIC_OR_DEPARTMENT_OTHER): Payer: Medicare HMO | Admitting: Oncology

## 2023-05-02 VITALS — BP 106/80 | HR 90 | Temp 98.1°F | Resp 18 | Wt 129.2 lb

## 2023-05-02 DIAGNOSIS — C50912 Malignant neoplasm of unspecified site of left female breast: Secondary | ICD-10-CM

## 2023-05-02 DIAGNOSIS — N179 Acute kidney failure, unspecified: Secondary | ICD-10-CM | POA: Diagnosis not present

## 2023-05-02 DIAGNOSIS — Z170 Estrogen receptor positive status [ER+]: Secondary | ICD-10-CM | POA: Diagnosis not present

## 2023-05-02 DIAGNOSIS — C784 Secondary malignant neoplasm of small intestine: Secondary | ICD-10-CM

## 2023-05-02 DIAGNOSIS — C50919 Malignant neoplasm of unspecified site of unspecified female breast: Secondary | ICD-10-CM | POA: Diagnosis not present

## 2023-05-02 DIAGNOSIS — Z79899 Other long term (current) drug therapy: Secondary | ICD-10-CM

## 2023-05-02 DIAGNOSIS — Z79811 Long term (current) use of aromatase inhibitors: Secondary | ICD-10-CM | POA: Insufficient documentation

## 2023-05-02 DIAGNOSIS — M8580 Other specified disorders of bone density and structure, unspecified site: Secondary | ICD-10-CM | POA: Diagnosis not present

## 2023-05-02 LAB — CBC WITH DIFFERENTIAL (CANCER CENTER ONLY)
Abs Immature Granulocytes: 0.03 10*3/uL (ref 0.00–0.07)
Basophils Absolute: 0.1 10*3/uL (ref 0.0–0.1)
Basophils Relative: 1 %
Eosinophils Absolute: 0.1 10*3/uL (ref 0.0–0.5)
Eosinophils Relative: 1 %
HCT: 41.2 % (ref 36.0–46.0)
Hemoglobin: 13.6 g/dL (ref 12.0–15.0)
Immature Granulocytes: 0 %
Lymphocytes Relative: 33 %
Lymphs Abs: 2.3 10*3/uL (ref 0.7–4.0)
MCH: 30.9 pg (ref 26.0–34.0)
MCHC: 33 g/dL (ref 30.0–36.0)
MCV: 93.6 fL (ref 80.0–100.0)
Monocytes Absolute: 0.3 10*3/uL (ref 0.1–1.0)
Monocytes Relative: 5 %
Neutro Abs: 4.2 10*3/uL (ref 1.7–7.7)
Neutrophils Relative %: 60 %
Platelet Count: 328 10*3/uL (ref 150–400)
RBC: 4.4 MIL/uL (ref 3.87–5.11)
RDW: 14.2 % (ref 11.5–15.5)
WBC Count: 7 10*3/uL (ref 4.0–10.5)
nRBC: 0 % (ref 0.0–0.2)

## 2023-05-02 LAB — CMP (CANCER CENTER ONLY)
ALT: 22 U/L (ref 0–44)
AST: 18 U/L (ref 15–41)
Albumin: 4.4 g/dL (ref 3.5–5.0)
Alkaline Phosphatase: 85 U/L (ref 38–126)
Anion gap: 11 (ref 5–15)
BUN: 28 mg/dL — ABNORMAL HIGH (ref 8–23)
CO2: 21 mmol/L — ABNORMAL LOW (ref 22–32)
Calcium: 9.5 mg/dL (ref 8.9–10.3)
Chloride: 103 mmol/L (ref 98–111)
Creatinine: 1.55 mg/dL — ABNORMAL HIGH (ref 0.44–1.00)
GFR, Estimated: 36 mL/min — ABNORMAL LOW (ref >60)
Glucose, Bld: 186 mg/dL — ABNORMAL HIGH (ref 70–99)
Potassium: 4.9 mmol/L (ref 3.5–5.1)
Sodium: 135 mmol/L (ref 135–145)
Total Bilirubin: 0.6 mg/dL (ref <1.2)
Total Protein: 8.3 g/dL — ABNORMAL HIGH (ref 6.5–8.1)

## 2023-05-03 LAB — CANCER ANTIGEN 27.29: CA 27.29: 27.5 U/mL (ref 0.0–38.6)

## 2023-05-03 LAB — CANCER ANTIGEN 15-3: CA 15-3: 31.2 U/mL — ABNORMAL HIGH (ref 0.0–25.0)

## 2023-05-06 NOTE — Progress Notes (Signed)
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  She was found to have diffuse duodenal wall thickening as well as a palpable mass in the left breast.  Duodenal biopsy as well as left breast biopsy was consistent withER positive greater than 90%, PR weakly +1 to 10% HER2 negative lobular breast cancer.  Patient underwent external biliary drain for the obstructive jaundice and palliative gastrojejunostomy surgery to relieve her gastric outlet obstruction.CT scan otherwise did not show any evidence of distant metastatic disease.   Patient was started on letrozole plus Kisqali in early April 2023.  However patient developed AKI and hyperuricemia requiring hospitalizations.  Idelle Jo was therefore stopped and patient was started on Ibrance 75 mg in May 2023.NGS testing showed no actionable mutations.  PD-L1 CPS score 1.  MSI stable.  Tumor mutational burden not high.      Interval history-overall patient is doing well.  Appetite and weight have remained unchanged.  She is tolerating Ibrance well without any significant nausea vomiting or diarrhea.  Drainage from the PTCA has been variable and there are times when she has to empty the bag often.  She does not complain any pain in her  abdomen presently  ECOG PS- 1 Pain scale- 0  Review of systems- Review of Systems  Constitutional:  Positive for malaise/fatigue. Negative for chills, fever and weight loss.  HENT:  Negative for congestion, ear discharge and nosebleeds.   Eyes:  Negative for blurred vision.  Respiratory:  Negative for cough, hemoptysis, sputum production, shortness of breath and wheezing.   Cardiovascular:  Negative for chest pain, palpitations, orthopnea and claudication.  Gastrointestinal:  Negative for abdominal pain, blood in stool, constipation, diarrhea, heartburn, melena, nausea and vomiting.  Genitourinary:  Negative for dysuria, flank pain, frequency, hematuria and urgency.  Musculoskeletal:  Negative for back pain, joint pain and myalgias.  Skin:  Negative for rash.  Neurological:  Negative for dizziness, tingling, focal weakness, seizures, weakness and headaches.  Endo/Heme/Allergies:  Does not bruise/bleed easily.  Psychiatric/Behavioral:  Negative for depression and suicidal ideas. The patient does not have insomnia.       Allergies  Allergen Reactions   Peanuts [Peanut Oil] Shortness Of Breath and Itching   Levaquin [Levofloxacin] Other (See Comments)    Severe abdominal pain and feeling mentally "in a dark place".   Flexeril [Cyclobenzaprine] Palpitations    Elevated heart rate after third day of taking     Past Medical History:  Diagnosis Date   Anemia    Anxiety    Breast cancer (HCC)    Gallstones    GERD (gastroesophageal reflux disease)    Hyperlipidemia    Hypertension    Jaundice 08/29/2021   Rash 08/29/2021   Right upper quadrant abdominal pain 08/29/2021   UTI (urinary tract infection) 08/29/2021     Past Surgical History:  Procedure  Laterality Date   BILATERAL SALPINGECTOMY  04/03/1997   CHOLECYSTECTOMY N/A 07/07/2015   Procedure: LAPAROSCOPIC CHOLECYSTECTOMY ;  Surgeon: Leafy Ro, MD;  Location: [facility] ORS;  Service: General;  Laterality: N/A;    ESOPHAGOGASTRODUODENOSCOPY  08/30/2021   Procedure: ESOPHAGOGASTRODUODENOSCOPY (EGD);  Surgeon: Midge Minium, MD;  Location: [facility] ENDOSCOPY;  Service: Endoscopy;;   GALLBLADDER SURGERY  07/07/2015   [facility]   IR BILIARY DILITATION  08/22/2022   IR BILIARY DRAIN PLACEMENT WITH CHOLANGIOGRAM  08/31/2021   IR BILIARY STENT(S) NEW ACCESS WITH DRAIN  02/08/2022   IR CHOLANGIOGRAM EXISTING TUBE  08/17/2022   IR CONVERT BILIARY DRAIN TO INT EXT BILIARY DRAIN  09/02/2021   IR EXCHANGE BILIARY DRAIN  10/10/2021   IR EXCHANGE BILIARY DRAIN  12/05/2021   IR EXCHANGE BILIARY DRAIN  01/19/2022   IR EXCHANGE BILIARY DRAIN  10/13/2022   IR EXCHANGE BILIARY DRAIN  11/24/2022   IR EXCHANGE BILIARY DRAIN  01/18/2023   IR EXCHANGE BILIARY DRAIN  03/13/2023   IR EXCHANGE BILIARY DRAIN  04/24/2023   IR INT EXT BILIARY DRAIN WITH CHOLANGIOGRAM  07/17/2022   IR RADIOLOGIST EVAL & MGMT  03/01/2022   IR RADIOLOGIST EVAL & MGMT  08/17/2022    Social History   Socioeconomic History   Marital status: Married    Spouse name: [name]   Number of children: 4   Years of education: Not on file   Highest education level: Not on file  Occupational History   Not on file  Tobacco Use   Smoking status: Never   Smokeless tobacco: Never  Vaping Use   Vaping status: Never Used  Substance and Sexual Activity   Alcohol use: No   Drug use: No   Sexual activity: Yes  Other Topics Concern   Not on file  Social History Narrative   Lives with spouse at home: [name] has Parkinson's. No indoor pets.    Social Determinants of Health   Financial Resource Strain: Low Risk  (02/06/2023)   Overall Financial Resource Strain (CARDIA)    Difficulty of Paying Living Expenses: Not hard at all  Food Insecurity: No Food Insecurity (02/06/2023)   Hunger Vital Sign    Worried About Running Out of Food in the Last Year: Never true    Ran Out of Food in the Last Year: Never true  Transportation Needs: No Transportation Needs (02/06/2023)   PRAPARE -  Administrator, Civil Service (Medical): No    Lack of Transportation (Non-Medical): No  Physical Activity: Inactive (02/06/2023)   Exercise Vital Sign    Days of Exercise per Week: 0 days    Minutes of Exercise per Session: 0 min  Stress: No Stress Concern Present (11/24/2021)   [employer] of Occupational Health - Occupational Stress Questionnaire    Feeling of Stress : Only a little  Social Connections: Moderately Isolated (02/06/2023)   Social Connection and Isolation Panel [NHANES]    Frequency of Communication with Friends and Family: More than three times a week    Frequency of Social Gatherings with Friends and Family: Never    Attends Religious Services: Never    Database administrator or Organizations: No    Attends Banker Meetings: Never    Marital Status: Married  Catering manager Violence: Not At Risk (02/06/2023)   Humiliation, Afraid, Rape, and Kick questionnaire    Fear of Current or Ex-Partner: No    Emotionally Abused: No    Physically Abused: No  Sexually Abused: No    Family History  Problem Relation Age of Onset   Hypertension Mother    CVA Mother    Ulcers Mother    Emphysema Mother    Heart disease Father    Prostate cancer Father    Macular degeneration Father    Hypertension Sister    Diabetes Sister    Cervical polyp Sister    Hypertension Sister    Diabetes Sister    Lymphoma Sister      Current Outpatient Medications:    acetaminophen (TYLENOL) 325 MG tablet, Take 325 mg by mouth every 6 (six) hours as needed (for pain and sometimes takes 2 if pain is worse)., Disp: , Rfl:    baclofen (LIORESAL) 10 MG tablet, Take 10 mg by mouth 3 (three) times daily as needed., Disp: , Rfl:    letrozole (FEMARA) 2.5 MG tablet, Take 1 tablet by mouth once daily, Disp: 90 tablet, Rfl: 0   levothyroxine (SYNTHROID) 88 MCG tablet, Take 1 tablet by mouth once daily, Disp: 90 tablet, Rfl: 0   Multiple Vitamins-Minerals (CENTRUM  VITAMINTS PO), Take 1 tablet by mouth daily., Disp: , Rfl:    ondansetron (ZOFRAN) 4 MG tablet, Take 1 tablet (4 mg total) by mouth daily as needed for nausea or vomiting., Disp: 30 tablet, Rfl: 2   oxyCODONE (ROXICODONE) 5 MG immediate release tablet, Take 1 tablet (5 mg total) by mouth every 4 (four) hours as needed for severe pain., Disp: 30 tablet, Rfl: 0   palbociclib (IBRANCE) 75 MG tablet, Take 1 tablet (75 mg total) by mouth daily. Take for 21 days on, 7 days off, repeat every 28 days., Disp: 21 tablet, Rfl: 0   pantoprazole (PROTONIX) 40 MG tablet, Take 1 tablet (40 mg total) by mouth daily., Disp: 90 tablet, Rfl: 1   potassium chloride SA (KLOR-CON M) 20 MEQ tablet, Take 1 tablet (20 mEq total) by mouth daily., Disp: 30 tablet, Rfl: 3   prochlorperazine (COMPAZINE) 5 MG tablet, Take 1 tablet (5 mg total) by mouth every 6 (six) hours as needed for refractory nausea / vomiting., Disp: 30 tablet, Rfl: 1   traZODone (DESYREL) 50 MG tablet, Take 50 mg by mouth at bedtime., Disp: , Rfl:  No current facility-administered medications for this visit.  Facility-Administered Medications Ordered in Other Visits:    cefTRIAXone (ROCEPHIN) 1 g in sodium chloride 0.9 % 100 mL IVPB, 1 g, Intravenous, Once, Alene Mires, NP  Physical exam:  Vitals:   05/02/23 1504  BP: 106/80  Pulse: 90  Resp: 18  Temp: 98.1 F (36.7 C)  TempSrc: Tympanic  SpO2: 97%  Weight: 129 lb 3.2 oz (58.6 kg)   Physical Exam Cardiovascular:     Rate and Rhythm: Normal rate and regular rhythm.     Heart sounds: Normal heart sounds.  Pulmonary:     Effort: Pulmonary effort is normal.     Breath sounds: Normal breath sounds.  Abdominal:     General: Bowel sounds are normal.     Palpations: Abdomen is soft.     Comments: PTCA in place and dressing over  Skin:    General: Skin is warm and dry.  Neurological:     Mental Status: She is alert and oriented to person, place, and time.   Breast exam: No palpable  masses in bilateral breast     Latest Ref Rng & Units 05/02/2023    2:45 PM  CMP  Glucose 70 - 99 mg/dL  186   BUN 8 - 23 mg/dL 28   Creatinine 4.09 - 1.00 mg/dL 8.11   Sodium 914 - 782 mmol/L 135   Potassium 3.5 - 5.1 mmol/L 4.9   Chloride 98 - 111 mmol/L 103   CO2 22 - 32 mmol/L 21   Calcium 8.9 - 10.3 mg/dL 9.5   Total Protein 6.5 - 8.1 g/dL 8.3   Total Bilirubin <9.5 mg/dL 0.6   Alkaline Phos 38 - 126 U/L 85   AST 15 - 41 U/L 18   ALT 0 - 44 U/L 22       Latest Ref Rng & Units 05/02/2023    2:45 PM  CBC  WBC 4.0 - 10.5 K/uL 7.0   Hemoglobin 12.0 - 15.0 g/dL 62.1   Hematocrit 30.8 - 46.0 % 41.2   Platelets 150 - 400 K/uL 328     No images are attached to the encounter.  IR EXCHANGE BILIARY DRAIN  Result Date: 04/24/2023 INDICATION: 67 year old female with history of metastatic breast cancer to the duodenum 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 the patient unfortunately presented with recurrent biliary obstruction in February 2024 requiring additional right-sided internal external biliary drain placement. EXAM: Fluoroscopic guided biliary drain exchange MEDICATIONS: Rocephin 1 g intravenous; The antibiotic was administered within an appropriate time frame prior to the initiation of the procedure. ANESTHESIA/SEDATION: Moderate (conscious) sedation was employed during this procedure. A total of Versed 1 mg and Fentanyl 100 mcg was administered intravenously. Moderate Sedation Time: 10 minutes. The patient's level of consciousness and vital signs were monitored continuously by radiology nursing throughout the procedure under my direct supervision. FLUOROSCOPY TIME:  Four mGy COMPLICATIONS: None immediate. PROCEDURE: Informed written consent was obtained from the patient after a thorough discussion of the procedural risks, benefits and  alternatives. All questions were addressed. Maximal Sterile Barrier Technique was utilized including caps, mask, sterile gowns, sterile gloves, sterile drape, hand hygiene and skin antiseptic. A timeout was performed prior to the initiation of the procedure. The indwelling right biliary drain was prepped and draped in standard fashion. Right upper quadrant scout radiograph demonstrated unchanged position of the indwelling drain. Contrast injection demonstrated patency of the proximal portion with possible occlusion of the distal pigtail portion. An Amplatz wire and Kumpe the catheter were inserted through the drain after cutting the external portion to release inter pigtail. The drain was unfurled and removed over the wire. A new, 14 [place] biliary drain with approximately 7 cm of additional sideholes cut was reinserted and placed in similar position. Contrast injection demonstrated patency of the drain. The drain was affixed to the skin with a 0 silk suture. Sterile bandage was applied. The drain was placed to bag drainage. The patient tolerated the procedure well was transferred back to the recovery area in good condition. IMPRESSION: Technically successful fluoroscopic guided exchange of indwelling 14 French internal external biliary drain with approximately 7 cm of additional sideholes cut. PLAN: Routine 6 week biliary drain exchanges moderate sedation. Marliss Coots, MD [facility] Radiology Electronically Signed   By: Marliss Coots M.D.   On: 04/24/2023 14:08     Assessment and plan- Patient is a 67 y.o. female with metastatic ER/PR positive HER2 negative lobular breast cancer presenting with duodenal obstruction and obstructive jaundice s/p palliative gastrojejunostomy.  She is currently on letrozole plus Ibrance and this is a routine follow-up visit  Patient is tolerating both letrozole and Ibrance well without any significant side effects.Labs today show  mild AKI and I have asked her to increase her fluid intake over the next few days.  I will repeat her BMP again in 2 weeks.  Tumor markers have remained stable.  I will plan to see her back in 2 months with CT scans and bone scans prior.  Her baseline bone density did show osteopenia but her 10-year probability of a major osteoporotic fracture was less than 20% and hip fracture less than 3% and therefore she is not on any bisphosphonates presently.   Visit Diagnosis 1. 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 High risk medication use   3. Use of letrozole (Femara)   4. AKI (acute kidney injury) (HCC)      Dr. Owens Shark, MD, MPH [facility] at [facility] [phone] 05/06/2023 12:59 PM

## 2023-05-11 ENCOUNTER — Other Ambulatory Visit (HOSPITAL_COMMUNITY): Payer: Self-pay

## 2023-05-16 ENCOUNTER — Inpatient Hospital Stay: Payer: Medicare HMO | Attending: Oncology

## 2023-05-16 DIAGNOSIS — C50912 Malignant neoplasm of unspecified site of left female breast: Secondary | ICD-10-CM

## 2023-05-16 DIAGNOSIS — C50919 Malignant neoplasm of unspecified site of unspecified female breast: Secondary | ICD-10-CM | POA: Diagnosis not present

## 2023-05-16 DIAGNOSIS — C784 Secondary malignant neoplasm of small intestine: Secondary | ICD-10-CM | POA: Diagnosis not present

## 2023-05-16 DIAGNOSIS — Z79899 Other long term (current) drug therapy: Secondary | ICD-10-CM

## 2023-05-16 LAB — BASIC METABOLIC PANEL
Anion gap: 9 (ref 5–15)
BUN: 31 mg/dL — ABNORMAL HIGH (ref 8–23)
CO2: 19 mmol/L — ABNORMAL LOW (ref 22–32)
Calcium: 9.3 mg/dL (ref 8.9–10.3)
Chloride: 105 mmol/L (ref 98–111)
Creatinine, Ser: 1.31 mg/dL — ABNORMAL HIGH (ref 0.44–1.00)
GFR, Estimated: 44 mL/min — ABNORMAL LOW (ref >60)
Glucose, Bld: 161 mg/dL — ABNORMAL HIGH (ref 70–99)
Potassium: 3.9 mmol/L (ref 3.5–5.1)
Sodium: 133 mmol/L — ABNORMAL LOW (ref 135–145)

## 2023-05-17 ENCOUNTER — Other Ambulatory Visit: Payer: Self-pay | Admitting: Oncology

## 2023-05-17 ENCOUNTER — Other Ambulatory Visit (HOSPITAL_COMMUNITY): Payer: Self-pay | Admitting: Pharmacy Technician

## 2023-05-17 ENCOUNTER — Other Ambulatory Visit (HOSPITAL_COMMUNITY): Payer: Self-pay

## 2023-05-17 DIAGNOSIS — C50912 Malignant neoplasm of unspecified site of left female breast: Secondary | ICD-10-CM

## 2023-05-17 MED ORDER — PALBOCICLIB 75 MG PO TABS
75.0000 mg | ORAL_TABLET | Freq: Every day | ORAL | 0 refills | Status: DC
  Filled 2023-05-17: qty 21, 28d supply, fill #0

## 2023-05-17 NOTE — Telephone Encounter (Signed)
CBC with Differential (Cancer Center Only) Order: [id number] Status: Final result     Visible to patient: Yes (seen)     Next appt: 07/04/2023 at 09:30 AM in Radiology ([facility])     Dx: Breast cancer metastasized to small i...   0 Result Notes          Component Ref Range & Units 2 wk ago (05/02/23) 3 mo ago (02/16/23) 4 mo ago (01/16/23) 4 mo ago (12/25/22) 4 mo ago (12/19/22) 5 mo ago (11/22/22) 6 mo ago (11/06/22)  WBC Count 4.0 - 10.5 K/uL 7.0 5.5 8.5 9.2 5.1 10.3 9.7  RBC 3.87 - 5.11 MIL/uL 4.40 3.86 Low  4.13 4.29 4.26 4.30 4.31  Hemoglobin 12.0 - 15.0 g/dL 06.2 37.6 Low  28.3 15.1 13.1 13.5 13.7  HCT 36.0 - 46.0 % 41.2 35.6 Low  38.1 39.8 40.9 39.8 40.2  MCV 80.0 - 100.0 fL 93.6 92.2 92.3 92.8 96.0 92.6 93.3  MCH 26.0 - 34.0 pg 30.9 30.6 30.8 31.7 30.8 31.4 31.8  MCHC 30.0 - 36.0 g/dL 76.1 60.7 37.1 06.2 69.4 33.9 34.1  RDW 11.5 - 15.5 % 14.2 14.6 13.6 13.7 13.6 13.1 13.6  Platelet Count 150 - 400 K/uL 328 222 467 High  406 High  372 467 High  220  nRBC 0.0 - 0.2 % 0.0 0.0 0.0 0.0 0.0 0.0 0.0  Neutrophils Relative % % 60 65 71 69 65 77 86  Neutro Abs 1.7 - 7.7 K/uL 4.2 3.6 6.2 6.5 3.3 8.0 High  8.3 High   Lymphocytes Relative % 33 25 20 23 29 17 6   Lymphs Abs 0.7 - 4.0 K/uL 2.3 1.4 1.7 2.1 1.5 1.8 0.6 Low   Monocytes Relative % 5 8 6 6 4 4 8   Monocytes Absolute 0.1 - 1.0 K/uL 0.3 0.4 0.5 0.5 0.2 0.4 0.7  Eosinophils Relative % 1 1 1  0 1 0 0  Eosinophils Absolute 0.0 - 0.5 K/uL 0.1 0.1 0.0 0.0 0.0 0.0 0.0  Basophils Relative % 1 1 1 1 1 1  0  Basophils Absolute 0.0 - 0.1 K/uL 0.1 0.0 0.1 0.1 0.1 0.1 0.0  Immature Granulocytes % 0 0 1 1 0 1 0  Abs Immature Granulocytes 0.00 - 0.07 K/uL 0.03 0.01 CM 0.05 CM 0.06 CM 0.01 CM 0.05 CM 0.02 CM  Comment: Performed at [facility], [address] [place], [place] [address]  Resulting Agency [facility]         Specimen  Collected: 05/02/23 14:45 Last Resulted: 05/02/23 14:54      Lab Flowsheet      Order Details      View Encounter      Lab and Collection Details      Routing      Result History    View All Conversations on this Encounter      CM=Additional comments      Result Care Coordination   Patient Communication   Add Comments   Seen Back to Top    Other Results from 05/02/2023   Contains abnormal data Cancer antigen 15-3 Order: [id number] Status: Final result      Visible to patient: Yes (seen)      Next appt: 07/04/2023 at 09:30 AM in Radiology ([facility])      Dx: Breast cancer metastasized to small i...    0 Result  Notes            Component Ref Range & Units 2 wk ago (05/02/23) 3 mo ago (02/16/23) 7 mo ago (09/20/22) 8 mo ago (08/21/22) 10 mo ago (07/10/22) 1 yr ago (01/04/22) 1 yr ago (11/21/21)  CA 15-3 0.0 - 25.0 U/mL 31.2 High  30.6 High  CM 33.4 High  CM 32.8 High  CM 31.5 High  CM 24.9 CM 36.8 High  CM  Comment: (NOTE) Roche Diagnostics Electrochemiluminescence Immunoassay (ECLIA) Values obtained with different assay methods or kits cannot be used interchangeably.  Results cannot be interpreted as absolute evidence of the presence or absence of malignant disease. Performed At: [facility] [address] [place], [place] [id number] Jolene Schimke MD WU:[id number]  Resulting Agency [facility] LAB         Specimen Collected: 05/02/23 14:45 Last Resulted: 05/03/23 03:35      Lab Flowsheet       Order Details       View Encounter       Lab and Collection Details       Routing       Result History     View All Conversations on this Encounter      CM=Additional comments      Result Care Coordination   Patient Communication   Add Comments   Seen Back to Top      Cancer antigen 27.29 Order: [id number] Status: Final result      Visible to patient: Yes  (seen)      Next appt: 07/04/2023 at 09:30 AM in Radiology ([facility])      Dx: Breast cancer metastasized to small i...    0 Result Notes            Component Ref Range & Units 2 wk ago (05/02/23) 3 mo ago (02/16/23) 4 mo ago (01/16/23) 4 mo ago (12/19/22) 5 mo ago (11/22/22) 7 mo ago (09/20/22) 8 mo ago (08/21/22)  CA 27.29 0.0 - 38.6 U/mL 27.5 21.7 CM 27.7 CM 25.5 CM 24.1 CM 25.2 CM 25.9 CM  Comment: (NOTE) Siemens Centaur Immunochemiluminometric Methodology (ICMA) Values obtained with different assay methods or kits cannot be used interchangeably. Results cannot be interpreted as absolute evidence of the presence or absence of malignant disease. Performed At: [facility] [address] [place], [place] [id number] Jolene Schimke MD BM:[id number]  Resulting Agency [facility]         Specimen Collected: 05/02/23 14:45 Last Resulted: 05/03/23 03:35      Lab Flowsheet       Order Details       View Encounter       Lab and Collection Details       Routing       Result History     View All Conversations on this Encounter      CM=Additional comments      Result Care Coordination   Patient Communication   Add Comments   Seen Back to Top       Contains abnormal data CMP (Cancer Center only) Order: [id number] Status: Final result      Visible to patient: Yes (seen)      Next appt: 07/04/2023 at 09:30 AM in Radiology ([facility])  Dx: Breast cancer metastasized to small i...    0 Result Notes    important suggestion  Newer results are available. Click to view them now.             Component Ref Range & Units 2 wk ago (05/02/23) 3 mo ago (02/16/23) 4 mo ago (01/16/23) 4 mo ago (01/04/23) 4 mo ago (12/25/22) 4 mo ago (12/19/22) 5 mo ago (11/22/22)  Sodium 135 - 145 mmol/L 135 134 Low  132 Low  139 R 132 Low  136 130 Low   Potassium 3.5 - 5.1 mmol/L 4.9 3.9  4.8 4.7 R 3.3 Low  4.1 4.3  Chloride 98 - 111 mmol/L 103 104 100 101 R 106 100 99  CO2 22 - 32 mmol/L 21 Low  21 Low  23 25 R 16 Low  26 20 Low   Glucose, Bld 70 - 99 mg/dL 295 High  284 High  CM 152 High  CM 125 High  228 High  CM 150 High  CM 150 High  CM  Comment: Glucose reference range applies only to samples taken after fasting for at least 8 hours.  BUN 8 - 23 mg/dL 28 High  23 30 High  12 R 39 High  24 High  30 High   Creatinine 0.44 - 1.00 mg/dL 1.32 High  4.40 1.02 High  0.96 R 1.39 High  1.02 High  1.21 High   Calcium 8.9 - 10.3 mg/dL 9.5 9.4 9.4 9.2 R 8.2 Low  9.4 9.2  Total Protein 6.5 - 8.1 g/dL 8.3 High  7.6 8.3 High   7.4 8.1 8.4 High   Albumin 3.5 - 5.0 g/dL 4.4 3.8 3.9  4.0 4.1 4.1  AST 15 - 41 U/L 18 37 18  24 19  40  ALT 0 - 44 U/L 22 49 High  28  24 28  51 High   Alkaline Phosphatase 38 - 126 U/L 85 157 High  156 High   94 130 High  277 High   Total Bilirubin <1.2 mg/dL 0.6 0.7 R 0.7 R  0.7 R 0.5 R 1.1 R  GFR, Estimated >60 mL/min 36 Low         Comment: (NOTE) Calculated using the CKD-EPI Creatinine Equation (2021)  Anion gap 5 - 15 11 9  CM 9 CM  10 CM 10 CM 11 CM  Comment: Performed at [facility], [address] [place], [place] [address]  Resulting Agency [facility] CLIN LAB [facility]         Specimen Collected: 05/02/23 14:45 Last Resulted: 05/02/23 15:06    Basic metabolic panel Order: [id number] Status: Final result     Visible to patient: Yes (seen)     Next appt: 07/04/2023 at 09:30 AM in Radiology ([facility])     Dx: High risk medication use; Breast canc...   0 Result Notes          Component Ref Range & Units 1 d ago (05/16/23) 2 wk ago (05/02/23) 3 mo ago (02/16/23) 4 mo ago (01/16/23) 4 mo ago (01/04/23) 4 mo ago (12/25/22) 4 mo ago (12/19/22)  Sodium 135 - 145 mmol/L 133 Low  135 134 Low  132 Low  139 R 132 Low  136  Potassium 3.5 - 5.1 mmol/L 3.9 4.9 3.9 4.8 4.7 R 3.3  Low  4.1  Chloride 98 - 111 mmol/L 105 103 104 100  101 R 106 100  CO2 22 - 32 mmol/L 19 Low  21 Low  21 Low  23 25 R 16 Low  26  Glucose, Bld 70 - 99 mg/dL 119 High  147 High  CM 121 High  CM 152 High  CM 125 High  228 High  CM 150 High  CM  Comment: Glucose reference range applies only to samples taken after fasting for at least 8 hours.  BUN 8 - 23 mg/dL 31 High  28 High  23 30 High  12 R 39 High  24 High   Creatinine, Ser 0.44 - 1.00 mg/dL 8.29 High  5.62 High  1.30 1.19 High  0.96 R 1.39 High  1.02 High   Calcium 8.9 - 10.3 mg/dL 9.3 9.5 9.4 9.4 9.2 R 8.2 Low  9.4  GFR, Estimated >60 mL/min 44 Low   >60 CM 49 Low  CM  41 Low  CM 60 Low  CM  Comment: (NOTE) Calculated using the CKD-EPI Creatinine Equation (2021)  Anion gap 5 - 15 9 11  CM 9 CM 9 CM  10 CM 10 CM  Comment: Performed at [facility], [address] [place], [place] [address]  Resulting Agency [facility] CLIN LAB CH CLIN LAB [facility] CLIN LAB [facility] CL[facility] LAB [facility] CLIN LAB [facility] LAB         Specimen Collected: 05/16/23 10:54 Last Resulted: 05/16/23 11:17

## 2023-05-17 NOTE — Progress Notes (Signed)
   Specialty Pharmacy Refill Coordination Note  Diamond Hinton is a 67 y.o. female contacted today regarding refills of specialty medication(s) Palbociclib   Patient requested Delivery   Delivery date: 05/29/23   Verified address: [address] [place]   Medication will be filled on 05/28/23.  Refill Request sent to MD; Call if any delays

## 2023-05-23 ENCOUNTER — Telehealth: Payer: Self-pay

## 2023-05-23 ENCOUNTER — Other Ambulatory Visit (HOSPITAL_COMMUNITY): Payer: Self-pay

## 2023-05-23 NOTE — Telephone Encounter (Signed)
Patient would like to meet in clinic on Thursday, 05/24/23, to sign application. I will submit once signatures are in hand. I will continue to follow and update until final determination.    Ardeen Fillers, CPhT Oncology Pharmacy Patient Advocate  [facility]  [phone] (phone) [phone] (fax) 05/23/2023 11:49 AM

## 2023-05-23 NOTE — Telephone Encounter (Signed)
Oral Oncology Patient Advocate Encounter   **While waiting for funding to open to renew grant for 2025, starting PAP application.**  Began application for assistance for Ibrance through [employer] Patient Assistance Program.   Application will be submitted upon completion of necessary supporting documentation.   [employer]'s phone number [phone].   I will continue to check the status until final determination.    Ardeen Fillers, CPhT Oncology Pharmacy Patient Advocate  [facility]  [phone] (phone) [phone] (fax) 05/23/2023 11:48 AM

## 2023-05-28 ENCOUNTER — Other Ambulatory Visit: Payer: Self-pay

## 2023-05-28 ENCOUNTER — Other Ambulatory Visit (HOSPITAL_COMMUNITY): Payer: Self-pay

## 2023-06-11 ENCOUNTER — Other Ambulatory Visit: Payer: Self-pay | Admitting: Radiology

## 2023-06-11 ENCOUNTER — Other Ambulatory Visit (HOSPITAL_COMMUNITY): Payer: Self-pay

## 2023-06-11 MED ORDER — DEXTROSE 5 % IV SOLN: 2.0000 g | Freq: Once | INTRAVENOUS | Status: AC

## 2023-06-11 NOTE — Telephone Encounter (Signed)
Called and spoke to patient regarding grant renewal. Patient is aware grant has been renewed for 2025 and that they can continue filling Ibrance through [facility] as they have been.    Ardeen Fillers, CPhT Oncology Pharmacy Patient Advocate  [facility]  [phone] (phone) [phone] (fax) 06/11/2023 10:30 AM

## 2023-06-11 NOTE — Progress Notes (Signed)
 Patient for IR Chole Tube Exchange on Tues 06/12/2023, I called and spoke with the patient on the phone and gave pre-procedure instructions. Pt was made aware to be here at 7:30a, NPO after MN prior to procedure as well as driver post procedure/recovery/discharge. Pt stated understanding.  Called 06/11/2023

## 2023-06-11 NOTE — Telephone Encounter (Signed)
Oral Oncology Patient Advocate Encounter  Was successful in securing patient a $15,000.00 grant from [employer] to provide copayment coverage for [place].  This will keep the out of pocket expense at $0.     [employer] ID: [id number]   The billing information is as follows and has been shared with [facility].    RxBin: [id number] PCN: [id number] Member ID: [id number] Group ID: [id number] Dates of Eligibility: 05/24/23 through 05/22/24  Fund:  Breast Cancer - Medicare Access   Ardeen Fillers, CPhT Oncology Pharmacy Patient Advocate  [facility]  [phone] (phone) [phone] (fax) 06/11/2023 10:22 AM

## 2023-06-11 NOTE — Addendum Note (Signed)
Encounter addended by: Assunta Found, RT on: 06/11/2023 1:25 PM  Actions taken: Imaging Exam ended

## 2023-06-12 ENCOUNTER — Encounter: Payer: Self-pay | Admitting: Radiology

## 2023-06-12 ENCOUNTER — Ambulatory Visit
Admission: RE | Admit: 2023-06-12 | Discharge: 2023-06-12 | Disposition: A | Discharge status: Home / Self Care | Payer: Medicare HMO | Source: Ambulatory Visit | Attending: Interventional Radiology | Admitting: Interventional Radiology

## 2023-06-12 ENCOUNTER — Other Ambulatory Visit: Payer: Self-pay

## 2023-06-12 DIAGNOSIS — E119 Type 2 diabetes mellitus without complications: Secondary | ICD-10-CM | POA: Diagnosis not present

## 2023-06-12 DIAGNOSIS — Z853 Personal history of malignant neoplasm of breast: Secondary | ICD-10-CM | POA: Insufficient documentation

## 2023-06-12 DIAGNOSIS — Z9049 Acquired absence of other specified parts of digestive tract: Secondary | ICD-10-CM | POA: Insufficient documentation

## 2023-06-12 DIAGNOSIS — I10 Essential (primary) hypertension: Secondary | ICD-10-CM | POA: Diagnosis not present

## 2023-06-12 DIAGNOSIS — K831 Obstruction of bile duct: Secondary | ICD-10-CM | POA: Insufficient documentation

## 2023-06-12 DIAGNOSIS — Z434 Encounter for attention to other artificial openings of digestive tract: Secondary | ICD-10-CM | POA: Insufficient documentation

## 2023-06-12 HISTORY — PX: IR EXCHANGE BILIARY DRAIN: IMG6046

## 2023-06-12 LAB — COMPREHENSIVE METABOLIC PANEL
ALT: 31 U/L (ref 0–44)
AST: 31 U/L (ref 15–41)
Albumin: 3.5 g/dL (ref 3.5–5.0)
Alkaline Phosphatase: 135 U/L — ABNORMAL HIGH (ref 38–126)
Anion gap: 11 (ref 5–15)
BUN: 26 mg/dL — ABNORMAL HIGH (ref 8–23)
CO2: 20 mmol/L — ABNORMAL LOW (ref 22–32)
Calcium: 9.1 mg/dL (ref 8.9–10.3)
Chloride: 108 mmol/L (ref 98–111)
Creatinine, Ser: 1.12 mg/dL — ABNORMAL HIGH (ref 0.44–1.00)
GFR, Estimated: 53 mL/min — ABNORMAL LOW (ref >60)
Glucose, Bld: 125 mg/dL — ABNORMAL HIGH (ref 70–99)
Potassium: 3.8 mmol/L (ref 3.5–5.1)
Sodium: 139 mmol/L (ref 135–145)
Total Bilirubin: 0.3 mg/dL (ref 0.0–1.2)
Total Protein: 7.2 g/dL (ref 6.5–8.1)

## 2023-06-12 LAB — CBC
HCT: 35.8 % — ABNORMAL LOW (ref 36.0–46.0)
Hemoglobin: 12 g/dL (ref 12.0–15.0)
MCH: 31.2 pg (ref 26.0–34.0)
MCHC: 33.5 g/dL (ref 30.0–36.0)
MCV: 93 fL (ref 80.0–100.0)
Platelets: 301 10*3/uL (ref 150–400)
RBC: 3.85 MIL/uL — ABNORMAL LOW (ref 3.87–5.11)
RDW: 14.2 % (ref 11.5–15.5)
WBC: 6.3 10*3/uL (ref 4.0–10.5)
nRBC: 0 % (ref 0.0–0.2)

## 2023-06-12 MED ORDER — SODIUM CHLORIDE 0.9 % IV SOLN
INTRAVENOUS | Status: DC
Start: 2023-06-12 — End: 2023-06-13

## 2023-06-12 MED ORDER — SODIUM CHLORIDE 0.9% FLUSH: 5.0000 mL | Freq: Three times a day (TID) | INTRAVENOUS | Status: DC

## 2023-06-12 MED ORDER — FENTANYL CITRATE (PF) 100 MCG/2ML IJ SOLN
INTRAMUSCULAR | Status: AC
  Filled 2023-06-12: qty 2

## 2023-06-12 MED ORDER — SODIUM CHLORIDE 0.9 % IV SOLN
2.0000 g | Freq: Once | INTRAVENOUS | Status: AC
  Administered 2023-06-12: 2 g via INTRAVENOUS
  Filled 2023-06-12: qty 20

## 2023-06-12 MED ORDER — MIDAZOLAM HCL 2 MG/2ML IJ SOLN
INTRAMUSCULAR | Status: AC | PRN
  Administered 2023-06-12: .5 mg via INTRAVENOUS
  Administered 2023-06-12: 1 mg via INTRAVENOUS

## 2023-06-12 MED ORDER — IOHEXOL 300 MG/ML  SOLN
50.0000 mL | Freq: Once | INTRAMUSCULAR | Status: AC | PRN
  Administered 2023-06-12: 7 mL

## 2023-06-12 MED ORDER — FENTANYL CITRATE (PF) 100 MCG/2ML IJ SOLN
INTRAMUSCULAR | Status: AC | PRN
  Administered 2023-06-12: 50 ug via INTRAVENOUS
  Administered 2023-06-12: 25 ug via INTRAVENOUS

## 2023-06-12 MED ORDER — MIDAZOLAM HCL 2 MG/2ML IJ SOLN
INTRAMUSCULAR | Status: AC
  Filled 2023-06-12: qty 4

## 2023-06-12 MED ORDER — LIDOCAINE HCL 1 % IJ SOLN
10.0000 mL | Freq: Once | INTRAMUSCULAR | Status: AC
  Administered 2023-06-12: 10 mL via INTRADERMAL

## 2023-06-12 MED ORDER — LIDOCAINE HCL 1 % IJ SOLN
INTRAMUSCULAR | Status: AC
  Filled 2023-06-12: qty 20

## 2023-06-12 NOTE — Procedures (Signed)
 Interventional Radiology Procedure Note  Procedure: Biliary drain exchange  Findings: Please refer to procedural dictation for full description. 14 Fr biliary drain with 7 cm extra side holes cut.  Complications: None immediate  Estimated Blood Loss: < 5 mL  Recommendations: Keep to bag drainage per usual.  Plan for routine 6 week exchanges with moderate sedation.   Diamond Sides, MD

## 2023-06-12 NOTE — H&P (Signed)
 Chief Complaint: Patient was seen in consultation today for biliary obstruction  Referring Physician(s): Melanee Piggs, MD  Supervising Physician: Jennefer Rover  Patient Status: [facility] - Out-pt  History of Present Illness: Diamond Hinton is a 67 y.o. 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 She is well-known to IR from initial biliary drain placement 08/31/21. A covered stent was placed into the common bile duct 02/08/22 but unfortunately she developed recurrent biliary obstruction. IR placed an additional 12 Fr right-sided internal/external biliary drain 07/17/22 with balloon angioplasty and drain upsize to a 14 Fr on 08/22/22.   She has undergone routine drain exchanges with moderate sedation om 01/18/23, 03/13/23 and 04/24/23. She presents today for 6 week drain exchange.  No concerns regarding drain output, some tenderness around suture site but otherwise doing well. Excited to have a blueberry muffin after the procedure.  Past Medical History:  Diagnosis Date   Anemia    Anxiety    Breast cancer (HCC)    Gallstones    GERD (gastroesophageal reflux disease)    Hyperlipidemia    Hypertension    Jaundice 08/29/2021   Rash 08/29/2021   Right upper quadrant abdominal pain 08/29/2021   UTI (urinary tract infection) 08/29/2021    Past Surgical History:  Procedure Laterality Date   BILATERAL SALPINGECTOMY  04/03/1997   CHOLECYSTECTOMY N/A 07/07/2015   Procedure: LAPAROSCOPIC CHOLECYSTECTOMY ;  Surgeon: Laneta JULIANNA Luna, MD;  Location: [facility] ORS;  Service: General;  Laterality: N/A;   ESOPHAGOGASTRODUODENOSCOPY  08/30/2021   Procedure: ESOPHAGOGASTRODUODENOSCOPY (EGD);  Surgeon: Jinny Carmine, MD;  Location: [facility] ENDOSCOPY;  Service: Endoscopy;;   GALLBLADDER SURGERY  07/07/2015   [facility]   IR BILIARY DILITATION  08/22/2022   IR BILIARY DRAIN PLACEMENT WITH CHOLANGIOGRAM   08/31/2021   IR BILIARY STENT(S) NEW ACCESS WITH DRAIN  02/08/2022   IR CHOLANGIOGRAM EXISTING TUBE  08/17/2022   IR CONVERT BILIARY DRAIN TO INT EXT BILIARY DRAIN  09/02/2021   IR EXCHANGE BILIARY DRAIN  10/10/2021   IR EXCHANGE BILIARY DRAIN  12/05/2021   IR EXCHANGE BILIARY DRAIN  01/19/2022   IR EXCHANGE BILIARY DRAIN  10/13/2022   IR EXCHANGE BILIARY DRAIN  11/24/2022   IR EXCHANGE BILIARY DRAIN  01/18/2023   IR EXCHANGE BILIARY DRAIN  03/13/2023   IR EXCHANGE BILIARY DRAIN  04/24/2023   IR INT EXT BILIARY DRAIN WITH CHOLANGIOGRAM  07/17/2022   IR RADIOLOGIST EVAL & MGMT  03/01/2022   IR RADIOLOGIST EVAL & MGMT  08/17/2022    Allergies: Peanuts [peanut oil], Levaquin  [levofloxacin ], and Flexeril  [cyclobenzaprine ]  Medications: Prior to Admission medications   Medication Sig Start Date End Date Taking? Authorizing Provider  acetaminophen  (TYLENOL ) 325 MG tablet Take 325 mg by mouth every 6 (six) hours as needed (for pain and sometimes takes 2 if pain is worse).    [provider]  baclofen  (LIORESAL ) 10 MG tablet Take 10 mg by mouth 3 (three) times daily as needed.    [provider]  letrozole  (FEMARA ) 2.5 MG tablet Take 1 tablet by mouth once daily 04/23/23   Rao, Archana C, MD  levothyroxine  (SYNTHROID ) 88 MCG tablet Take 1 tablet by mouth once daily 03/22/23   Bacigalupo, Angela M, MD  Multiple Vitamins-Minerals (CENTRUM VITAMINTS PO) Take 1 tablet by mouth daily.    [provider]  ondansetron  (ZOFRAN ) 4 MG tablet Take 1 tablet (4 mg  total) by mouth daily as needed for nausea or vomiting. 01/16/23 01/16/24  Melanee Annah BROCKS, MD  oxyCODONE  (ROXICODONE ) 5 MG immediate release tablet Take 1 tablet (5 mg total) by mouth every 4 (four) hours as needed for severe pain. 07/24/22   Myrla Jon HERO, MD  palbociclib  (IBRANCE ) 75 MG tablet Take 1 tablet (75 mg total) by mouth daily. Take for 21 days on, 7 days off, repeat every 28 days. 05/17/23   Melanee Annah BROCKS, MD  pantoprazole   (PROTONIX ) 40 MG tablet Take 1 tablet (40 mg total) by mouth daily. 01/04/23   Bacigalupo, Angela M, MD  potassium chloride  SA (KLOR-CON  M) 20 MEQ tablet Take 1 tablet (20 mEq total) by mouth daily. 01/16/23   Melanee Annah BROCKS, MD  prochlorperazine  (COMPAZINE ) 5 MG tablet Take 1 tablet (5 mg total) by mouth every 6 (six) hours as needed for refractory nausea / vomiting. 02/14/22   Melanee Annah BROCKS, MD  traZODone  (DESYREL ) 50 MG tablet Take 50 mg by mouth at bedtime.    [provider]     Family History  Problem Relation Age of Onset   Hypertension Mother    CVA Mother    Ulcers Mother    Emphysema Mother    Heart disease Father    Prostate cancer Father    Macular degeneration Father    Hypertension Sister    Diabetes Sister    Cervical polyp Sister    Hypertension Sister    Diabetes Sister    Lymphoma Sister     Social History   Socioeconomic History   Marital status: Married    Spouse name: Pasco   Number of children: 4   Years of education: Not on file   Highest education level: Not on file  Occupational History   Not on file  Tobacco Use   Smoking status: Never   Smokeless tobacco: Never  Vaping Use   Vaping status: Never Used  Substance and Sexual Activity   Alcohol use: No   Drug use: No   Sexual activity: Yes  Other Topics Concern   Not on file  Social History Narrative   Lives with spouse at home: Pasco has Parkinson's. No indoor pets.    Social Drivers of Corporate Investment Banker Strain: Low Risk  (02/06/2023)   Overall Financial Resource Strain (CARDIA)    Difficulty of Paying Living Expenses: Not hard at all  Food Insecurity: No Food Insecurity (02/06/2023)   Hunger Vital Sign    Worried About Running Out of Food in the Last Year: Never true    Ran Out of Food in the Last Year: Never true  Transportation Needs: No Transportation Needs (02/06/2023)   PRAPARE - Administrator, Civil Service (Medical): No    Lack of Transportation  (Non-Medical): No  Physical Activity: Inactive (02/06/2023)   Exercise Vital Sign    Days of Exercise per Week: 0 days    Minutes of Exercise per Session: 0 min  Stress: No Stress Concern Present (11/24/2021)   [employer] of Occupational Health - Occupational Stress Questionnaire    Feeling of Stress : Only a little  Social Connections: Moderately Isolated (02/06/2023)   Social Connection and Isolation Panel [NHANES]    Frequency of Communication with Friends and Family: More than three times a week    Frequency of Social Gatherings with Friends and Family: Never    Attends Religious Services: Never    Database Administrator or  Organizations: No    Attends Banker Meetings: Never    Marital Status: Married     Review of Systems: A 12 point ROS discussed and pertinent positives are indicated in the HPI above.  All other systems are negative.  Review of Systems  Constitutional:  Negative for appetite change and fever.  Respiratory:  Negative for cough and shortness of breath.   Cardiovascular:  Negative for chest pain.  Gastrointestinal:  Negative for abdominal pain, diarrhea, nausea and vomiting.  Musculoskeletal:  Negative for back pain.  Neurological:  Negative for dizziness and headaches.    Vital Signs: There were no vitals taken for this visit.  Physical Exam Vitals reviewed.  Constitutional:      General: She is not in acute distress. HENT:     Head: Normocephalic.     Mouth/Throat:     Mouth: Mucous membranes are moist.     Pharynx: Oropharynx is clear. No oropharyngeal exudate or posterior oropharyngeal erythema.  Cardiovascular:     Rate and Rhythm: Normal rate and regular rhythm.  Pulmonary:     Effort: Pulmonary effort is normal.     Breath sounds: Normal breath sounds.  Abdominal:     General: There is no distension.     Palpations: Abdomen is soft.     Tenderness: There is no abdominal tenderness.  Skin:    General: Skin is warm and  dry.  Neurological:     Mental Status: She is alert and oriented to person, place, and time.  Psychiatric:        Mood and Affect: Mood normal.        Behavior: Behavior normal.        Thought Content: Thought content normal.        Judgment: Judgment normal.      MD Evaluation Airway: WNL Heart: WNL Abdomen: WNL Chest/ Lungs: WNL ASA  Classification: 3 Mallampati/Airway Score: Two   Imaging: No results found.  Labs:  CBC: Recent Labs    12/25/22 1834 01/16/23 1241 02/16/23 0930 05/02/23 1445  WBC 9.2 8.5 5.5 7.0  HGB 13.6 12.7 11.8* 13.6  HCT 39.8 38.1 35.6* 41.2  PLT 406* 467* 222 328    COAGS: Recent Labs    08/22/22 1125 10/13/22 1313 11/06/22 0520  INR 1.1 1.2 1.2  APTT  --   --  28    BMP: Recent Labs    01/16/23 1241 02/16/23 0930 05/02/23 1445 05/16/23 1054  NA 132* 134* 135 133*  K 4.8 3.9 4.9 3.9  CL 100 104 103 105  CO2 23 21* 21* 19*  GLUCOSE 152* 121* 186* 161*  BUN 30* 23 28* 31*  CALCIUM  9.4 9.4 9.5 9.3  CREATININE 1.19* 1.00 1.55* 1.31*  GFRNONAA 49* >60 36* 44*    LIVER FUNCTION TESTS: Recent Labs    12/25/22 1834 01/16/23 1241 02/16/23 0930 05/02/23 1445  BILITOT 0.7 0.7 0.7 0.6  AST 24 18 37 18  ALT 24 28 49* 22  ALKPHOS 94 156* 157* 85  PROT 7.4 8.3* 7.6 8.3*  ALBUMIN  4.0 3.9 3.8 4.4    TUMOR MARKERS: No results for input(s): AFPTM, CEA, CA199, CHROMGRNA in the last 8760 hours.  Assessment and Plan:  67 y/o F with history of metastatic breast cancer and biliary obstruction s/p biliary drain placement placement in IR who presents today for routine biliary drain exchange with moderate sedation.  Risks and benefits discussed with the patient including bleeding, infection, damage to adjacent  structures, bowel perforation/fistula connection, and sepsis.  All of the patient's questions were answered, patient is agreeable to proceed.  Consent signed and in chart.  Thank you for this interesting  consult.  I greatly enjoyed meeting Diamond Hinton and look forward to participating in their care.  A copy of this report was sent to the requesting provider on this date.  Electronically Signed: Clotilda DELENA Hesselbach, PA-C 06/12/2023, 7:39 AM   I spent a total of 30 Minutes  in face to face in clinical consultation, greater than 50% of which was counseling/coordinating care for biliary obstruction.

## 2023-06-16 ENCOUNTER — Other Ambulatory Visit: Payer: Self-pay | Admitting: Family Medicine

## 2023-06-16 DIAGNOSIS — E039 Hypothyroidism, unspecified: Secondary | ICD-10-CM

## 2023-06-18 ENCOUNTER — Other Ambulatory Visit: Payer: Self-pay

## 2023-06-18 ENCOUNTER — Other Ambulatory Visit: Payer: Self-pay | Admitting: Oncology

## 2023-06-18 DIAGNOSIS — C50912 Malignant neoplasm of unspecified site of left female breast: Secondary | ICD-10-CM

## 2023-06-18 NOTE — Progress Notes (Signed)
 Specialty Pharmacy Ongoing Clinical Assessment Note  Diamond Hinton is a 68 y.o. female who is being followed by the specialty pharmacy service for RxSp Oncology   Patient's specialty medication(s) reviewed today: Palbociclib  (IBRANCE )   Missed doses in the last 4 weeks: 0   Patient/Caregiver did not have any additional questions or concerns.   Therapeutic benefit summary: Patient is achieving benefit   Adverse events/side effects summary: No adverse events/side effects   Patient's therapy is appropriate to: Continue    Goals Addressed             This Visit's Progress    Slow Disease Progression       Patient is on track. Patient will maintain adherence and adhere to provider and/or lab appointments. Per last visit note from 05/02/23 tumor markers remain stable. Repeat scans scheduled for 07/04/23.         Follow up:  6 months  [name] M Kobee Medlen Specialty Pharmacist

## 2023-06-18 NOTE — Progress Notes (Signed)
 Specialty Pharmacy Refill Coordination Note  Diamond Hinton is a 68 y.o. female contacted today regarding refills of specialty medication(s) Palbociclib  (IBRANCE )   Patient requested Delivery   Delivery date: 06/28/23   Verified address: [address] [place]   Medication will be filled on 06/27/23.

## 2023-06-19 ENCOUNTER — Other Ambulatory Visit: Payer: Self-pay

## 2023-06-19 MED ORDER — PALBOCICLIB 75 MG PO TABS
75.0000 mg | ORAL_TABLET | Freq: Every day | ORAL | 0 refills | Status: AC
  Filled 2023-06-19: qty 21, 28d supply, fill #0

## 2023-06-19 NOTE — Telephone Encounter (Signed)
 CBC Order: [id number]  Status: Final result     Next appt: 07/04/2023 at 09:30 AM in Radiology ([facility])     Dx: Biliary obstruction   Test Result Released: Yes (seen)   0 Result Notes          Component Ref Range & Units (hover) 7 d ago (06/12/23) 1 mo ago (05/02/23) 4 mo ago (02/16/23) 5 mo ago (01/16/23) 5 mo ago (12/25/22) 6 mo ago (12/19/22) 6 mo ago (11/22/22)  WBC 6.3 7.0 5.5 8.5 9.2 5.1 10.3  RBC 3.85 Low  4.40 3.86 Low  4.13 4.29 4.26 4.30  Hemoglobin 12.0 13.6 11.8 Low  12.7 13.6 13.1 13.5  HCT 35.8 Low  41.2 35.6 Low  38.1 39.8 40.9 39.8  MCV 93.0 93.6 92.2 92.3 92.8 96.0 92.6  MCH 31.2 30.9 30.6 30.8 31.7 30.8 31.4  MCHC 33.5 33.0 33.1 33.3 34.2 32.0 33.9  RDW 14.2 14.2 14.6 13.6 13.7 13.6 13.1  Platelets 301 328 222 467 High  406 High  372 467 High   nRBC 0.0 0.0 0.0 0.0 0.0 0.0 0.0  Comment: Performed at [facility], [address] [place], [place] [address]  Neutrophils Relative %  60 R 65 R 71 R 69 R 65 R 77 R  Basophils Absolute  0.1 R 0.0 R 0.1 R 0.1 R 0.1 R 0.1 R  Immature Granulocytes  0 R 0 R 1 R 1 R 0 R 1 R  Abs Immature Granulocytes  0.03 R, CM 0.01 R, CM 0.05 R, CM 0.06 R, CM 0.01 R, CM 0.05 R, CM  Neutro Abs  4.2 R 3.6 R 6.2 R 6.5 R 3.3 R 8.0 High  R  Lymphocytes Relative  33 R 25 R 20 R 23 R 29 R 17 R  Lymphs Abs  2.3 R 1.4 R 1.7 R 2.1 R 1.5 R 1.8 R  Monocytes Relative  5 R 8 R 6 R 6 R 4 R 4 R  Monocytes Absolute  0.3 R 0.4 R 0.5 R 0.5 R 0.2 R 0.4 R  Eosinophils Relative  1 R 1 R 1 R 0 R 1 R 0 R  Eosinophils Absolute  0.1 R 0.1 R 0.0 R 0.0 R 0.0 R 0.0 R  Basophils Relative  1 R 1 R 1 R 1 R 1 R 1 R  Resulting Agency [facility]        Specimen Collected: 06/12/23 08:06 Last Resulted: 06/12/23 08:27  Comprehensive metabolic panel Order: [id number]  Status: Final result     Next appt: 07/04/2023 at 09:30 AM in Radiology ([facility])     Dx: Biliary  obstruction     Test Result Released: Yes (seen)   0 Result Notes          Component Ref Range & Units (hover) 7 d ago (06/12/23) 1 mo ago (05/16/23) 1 mo ago (05/02/23) 4 mo ago (02/16/23) 5 mo ago (01/16/23) 5 mo ago (01/04/23) 5 mo ago (12/25/22)  Sodium 139 133 Low  135 134 Low  132 Low  139 R 132 Low   Potassium 3.8 3.9 4.9 3.9 4.8 4.7 R 3.3 Low   Chloride 108 105 103 104 100 101 R 106  CO2 20 Low  19 Low  21 Low  21 Low  23 25 R 16 Low   Glucose, Bld 125 High  161 High  CM 186 High  CM 121 High  CM 152 High  CM 125 High  228 High  CM  Comment: Glucose reference range applies only to samples taken after fasting for at least 8 hours.  BUN 26 High  31 High  28 High  23 30 High  12 R 39 High   Creatinine, Ser 1.12 High  1.31 High  1.55 High  1.00 1.19 High  0.96 R 1.39 High   Calcium  9.1 9.3 9.5 9.4 9.4 9.2 R 8.2 Low   Total Protein 7.2  8.3 High  7.6 8.3 High   7.4  Albumin  3.5  4.4 3.8 3.9  4.0  AST 31  18 37 18  24  ALT 31  22 49 High  28  24  Alkaline Phosphatase 135 High   85 157 High  156 High   94  Total Bilirubin 0.3  0.6 R 0.7 R 0.7 R  0.7 R  GFR, Estimated 53 Low  44 Low  CM  >60 CM 49 Low  CM  41 Low  CM  Comment: (NOTE) Calculated using the CKD-EPI Creatinine Equation (2021)  Anion gap 11 9 CM 11 CM 9 CM 9 CM  10 CM  Comment: Performed at [facility], [address] [place], [place] [address]  Resulting Agency [facility]

## 2023-06-19 NOTE — Telephone Encounter (Signed)
 Requested Prescriptions  Pending Prescriptions Disp Refills   levothyroxine  (SYNTHROID ) 88 MCG tablet [Pharmacy Med Name: Levothyroxine  Sodium 88 MCG Oral Tablet] 90 tablet 0    Sig: Take 1 tablet by mouth once daily     Endocrinology:  Hypothyroid Agents Failed - 06/19/2023  2:21 PM      Failed - TSH in normal range and within 360 days    TSH  Date Value Ref Range Status  10/24/2022 4.777 (H) 0.350 - 4.500 uIU/mL Final    Comment:    Performed by a 3rd Generation assay with a functional sensitivity of <=0.01 uIU/mL. Performed at [facility], [address] [place], [place] [address]   03/21/2022 4.260 0.450 - 4.500 uIU/mL Final         Passed - Valid encounter within last 12 months    Recent Outpatient Visits           5 months ago Acute non-recurrent pansinusitis   [facility] [place], Jon HERO, MD   8 months ago Type 2 diabetes mellitus with other specified complication, without long-term current use of insulin  [facility])   [facility] [place], Jon HERO, MD   10 months ago Hematuria, unspecified type   [facility] [place], Jon HERO, MD   11 months ago Biliary stent obstruction, initial encounter   [facility] [place], Jon HERO, MD   1 year ago Palpitations   [facility] [place], Jon HERO, MD

## 2023-06-25 ENCOUNTER — Other Ambulatory Visit: Payer: Self-pay | Admitting: Family Medicine

## 2023-06-26 NOTE — Telephone Encounter (Signed)
 Requested Prescriptions  Pending Prescriptions Disp Refills   pantoprazole  (PROTONIX ) 40 MG tablet [Pharmacy Med Name: Pantoprazole  Sodium 40 MG Oral Tablet Delayed Release] 90 tablet 0    Sig: Take 1 tablet by mouth once daily     Gastroenterology: Proton Pump Inhibitors Passed - 06/26/2023  1:57 PM      Passed - Valid encounter within last 12 months    Recent Outpatient Visits           5 months ago Acute non-recurrent pansinusitis   [facility] [place], Jon HERO, MD   8 months ago Type 2 diabetes mellitus with other specified complication, without long-term current use of insulin  [facility])   [facility] [place], Jon HERO, MD   10 months ago Hematuria, unspecified type   [facility] [place], Jon HERO, MD   11 months ago Biliary stent obstruction, initial encounter   [facility] [place], Jon HERO, MD   1 year ago Palpitations   [facility] [place], Jon HERO, MD

## 2023-06-27 ENCOUNTER — Other Ambulatory Visit: Payer: Self-pay

## 2023-07-02 ENCOUNTER — Ambulatory Visit
Admission: RE | Admit: 2023-07-02 | Discharge: 2023-07-02 | Disposition: A | Discharge status: Home / Self Care | Payer: Medicare HMO | Source: Ambulatory Visit | Attending: Oncology | Admitting: Oncology

## 2023-07-02 ENCOUNTER — Encounter
Admission: RE | Admit: 2023-07-02 | Discharge: 2023-07-02 | Disposition: A | Discharge status: Home / Self Care | Payer: Medicare HMO | Source: Ambulatory Visit | Attending: Oncology | Admitting: Oncology

## 2023-07-02 DIAGNOSIS — C50919 Malignant neoplasm of unspecified site of unspecified female breast: Secondary | ICD-10-CM | POA: Diagnosis not present

## 2023-07-02 DIAGNOSIS — C50912 Malignant neoplasm of unspecified site of left female breast: Secondary | ICD-10-CM | POA: Insufficient documentation

## 2023-07-02 DIAGNOSIS — Z79899 Other long term (current) drug therapy: Secondary | ICD-10-CM | POA: Insufficient documentation

## 2023-07-02 DIAGNOSIS — M439 Deforming dorsopathy, unspecified: Secondary | ICD-10-CM | POA: Diagnosis not present

## 2023-07-02 DIAGNOSIS — C784 Secondary malignant neoplasm of small intestine: Secondary | ICD-10-CM | POA: Insufficient documentation

## 2023-07-02 MED ORDER — TECHNETIUM TC 99M MEDRONATE IV KIT
20.0000 | PACK | Freq: Once | INTRAVENOUS | Status: AC | PRN
  Administered 2023-07-02: 22.14 via INTRAVENOUS

## 2023-07-03 ENCOUNTER — Other Ambulatory Visit: Payer: Self-pay

## 2023-07-04 ENCOUNTER — Ambulatory Visit
Admission: RE | Admit: 2023-07-04 | Discharge: 2023-07-04 | Disposition: A | Discharge status: Home / Self Care | Payer: Medicare HMO | Source: Ambulatory Visit | Attending: Oncology | Admitting: Oncology

## 2023-07-04 ENCOUNTER — Other Ambulatory Visit: Payer: Medicare HMO

## 2023-07-04 DIAGNOSIS — C50912 Malignant neoplasm of unspecified site of left female breast: Secondary | ICD-10-CM | POA: Diagnosis not present

## 2023-07-04 DIAGNOSIS — C7981 Secondary malignant neoplasm of breast: Secondary | ICD-10-CM | POA: Diagnosis not present

## 2023-07-04 DIAGNOSIS — C784 Secondary malignant neoplasm of small intestine: Secondary | ICD-10-CM | POA: Insufficient documentation

## 2023-07-04 DIAGNOSIS — E041 Nontoxic single thyroid nodule: Secondary | ICD-10-CM | POA: Diagnosis not present

## 2023-07-04 DIAGNOSIS — Z79899 Other long term (current) drug therapy: Secondary | ICD-10-CM | POA: Insufficient documentation

## 2023-07-04 DIAGNOSIS — K8689 Other specified diseases of pancreas: Secondary | ICD-10-CM | POA: Diagnosis not present

## 2023-07-04 MED ORDER — IOHEXOL 300 MG/ML  SOLN
80.0000 mL | Freq: Once | INTRAMUSCULAR | Status: AC | PRN
  Administered 2023-07-04: 80 mL via INTRAVENOUS

## 2023-07-16 ENCOUNTER — Other Ambulatory Visit: Payer: Self-pay

## 2023-07-16 DIAGNOSIS — C50912 Malignant neoplasm of unspecified site of left female breast: Secondary | ICD-10-CM

## 2023-07-17 ENCOUNTER — Encounter: Payer: Self-pay | Admitting: Oncology

## 2023-07-17 ENCOUNTER — Inpatient Hospital Stay: Payer: Medicare HMO | Admitting: Oncology

## 2023-07-17 ENCOUNTER — Inpatient Hospital Stay: Payer: Medicare HMO | Attending: Oncology

## 2023-07-17 VITALS — BP 132/73 | HR 88 | Temp 101.9°F | Resp 18 | Wt 135.1 lb

## 2023-07-17 DIAGNOSIS — Z79811 Long term (current) use of aromatase inhibitors: Secondary | ICD-10-CM

## 2023-07-17 DIAGNOSIS — C784 Secondary malignant neoplasm of small intestine: Secondary | ICD-10-CM | POA: Insufficient documentation

## 2023-07-17 DIAGNOSIS — C50912 Malignant neoplasm of unspecified site of left female breast: Secondary | ICD-10-CM | POA: Diagnosis not present

## 2023-07-17 DIAGNOSIS — R509 Fever, unspecified: Secondary | ICD-10-CM | POA: Insufficient documentation

## 2023-07-17 DIAGNOSIS — Z79899 Other long term (current) drug therapy: Secondary | ICD-10-CM

## 2023-07-17 DIAGNOSIS — Z1721 Progesterone receptor positive status: Secondary | ICD-10-CM | POA: Insufficient documentation

## 2023-07-17 DIAGNOSIS — Z170 Estrogen receptor positive status [ER+]: Secondary | ICD-10-CM | POA: Insufficient documentation

## 2023-07-17 DIAGNOSIS — Z1732 Human epidermal growth factor receptor 2 negative status: Secondary | ICD-10-CM | POA: Diagnosis not present

## 2023-07-17 LAB — CMP (CANCER CENTER ONLY)
ALT: 41 U/L (ref 0–44)
AST: 32 U/L (ref 15–41)
Albumin: 4.1 g/dL (ref 3.5–5.0)
Alkaline Phosphatase: 94 U/L (ref 38–126)
Anion gap: 11 (ref 5–15)
BUN: 20 mg/dL (ref 8–23)
CO2: 22 mmol/L (ref 22–32)
Calcium: 9 mg/dL (ref 8.9–10.3)
Chloride: 103 mmol/L (ref 98–111)
Creatinine: 1.18 mg/dL — ABNORMAL HIGH (ref 0.44–1.00)
GFR, Estimated: 50 mL/min — ABNORMAL LOW (ref >60)
Glucose, Bld: 120 mg/dL — ABNORMAL HIGH (ref 70–99)
Potassium: 4.2 mmol/L (ref 3.5–5.1)
Sodium: 136 mmol/L (ref 135–145)
Total Bilirubin: 0.8 mg/dL (ref 0.0–1.2)
Total Protein: 7.7 g/dL (ref 6.5–8.1)

## 2023-07-17 LAB — CBC WITH DIFFERENTIAL (CANCER CENTER ONLY)
Abs Immature Granulocytes: 0.04 10*3/uL (ref 0.00–0.07)
Basophils Absolute: 0 10*3/uL (ref 0.0–0.1)
Basophils Relative: 1 %
Eosinophils Absolute: 0 10*3/uL (ref 0.0–0.5)
Eosinophils Relative: 0 %
HCT: 35.7 % — ABNORMAL LOW (ref 36.0–46.0)
Hemoglobin: 11.9 g/dL — ABNORMAL LOW (ref 12.0–15.0)
Immature Granulocytes: 1 %
Lymphocytes Relative: 11 %
Lymphs Abs: 0.8 10*3/uL (ref 0.7–4.0)
MCH: 31.2 pg (ref 26.0–34.0)
MCHC: 33.3 g/dL (ref 30.0–36.0)
MCV: 93.5 fL (ref 80.0–100.0)
Monocytes Absolute: 0.4 10*3/uL (ref 0.1–1.0)
Monocytes Relative: 6 %
Neutro Abs: 6.3 10*3/uL (ref 1.7–7.7)
Neutrophils Relative %: 81 %
Platelet Count: 281 10*3/uL (ref 150–400)
RBC: 3.82 MIL/uL — ABNORMAL LOW (ref 3.87–5.11)
RDW: 14.1 % (ref 11.5–15.5)
WBC Count: 7.7 10*3/uL (ref 4.0–10.5)
nRBC: 0 % (ref 0.0–0.2)

## 2023-07-17 MED ORDER — LEVOFLOXACIN 250 MG PO TABS: 500.0000 mg | ORAL_TABLET | Freq: Every day | ORAL | 0 refills | Status: AC

## 2023-07-17 NOTE — Progress Notes (Signed)
 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  She was found to have diffuse duodenal wall thickening as well as a palpable mass in the left breast.  Duodenal biopsy as well as left breast biopsy was consistent withER positive greater than 90%, PR weakly +1 to 10% HER2 negative lobular breast cancer.  Patient underwent external biliary drain for the obstructive jaundice and palliative gastrojejunostomy surgery to relieve her gastric outlet obstruction.CT scan otherwise did not show any evidence of distant metastatic disease.   Patient was started on letrozole  plus Kisqali  in early April 2023.  However patient developed AKI and hyperuricemia requiring hospitalizations.  Kisqali  was therefore stopped and patient was started on Ibrance  75 mg in May 2023.NGS testing showed no actionable mutations.  PD-L1 CPS score 1.  MSI stable.  Tumor mutational burden not high.    Interval history-patient states that she had a low-grade fever yesterday night of 100.5.  She feels fatigued but denies any other complaints.  Her biliary drain has been draining well over the last 24 hours.  She denies any cough or shortness of breath or burning urination.  She has occasional pain at  the site of insertion of the PTCA  ECOG PS- 1 Pain scale- 3 Opioid associated constipation- no  Review of systems- Review of Systems  Constitutional:  Positive for fever and malaise/fatigue.      Allergies  Allergen Reactions   Peanuts [Peanut Oil] Shortness Of Breath and Itching   Levaquin  [Levofloxacin ] Other (See Comments)    Severe abdominal pain and feeling mentally in a dark place.   Flexeril  [Cyclobenzaprine ] Palpitations    Elevated heart rate after third day of taking     Past Medical History:  Diagnosis Date   Anemia    Anxiety    Breast cancer (HCC)    Gallstones    GERD (gastroesophageal reflux disease)    Hyperlipidemia    Hypertension    Jaundice 08/29/2021   Rash 08/29/2021   Right upper quadrant abdominal pain 08/29/2021   UTI (urinary tract infection) 08/29/2021     Past Surgical History:  Procedure Laterality Date   BILATERAL SALPINGECTOMY  04/03/1997   CHOLECYSTECTOMY N/A 07/07/2015   Procedure: LAPAROSCOPIC CHOLECYSTECTOMY ;  Surgeon: Laneta FALCON Luna, MD;  Location: [facility] ORS;  Service: General;  Laterality: N/A;   ESOPHAGOGASTRODUODENOSCOPY  08/30/2021   Procedure: ESOPHAGOGASTRODUODENOSCOPY (EGD);  Surgeon: Jinny Carmine, MD;  Location: [facility]COPY;  Service: Endoscopy;;   GALLBLADDER SURGERY  07/07/2015   [facility]   IR BILIARY DILITATION  08/22/2022   IR BILIARY DRAIN PLACEMENT WITH CHOLANGIOGRAM  08/31/2021   IR BILIARY STENT(S) NEW ACCESS WITH DRAIN  02/08/2022   IR CHOLANGIOGRAM EXISTING TUBE  08/17/2022   IR CONVERT BILIARY DRAIN TO INT EXT BILIARY DRAIN  09/02/2021   IR EXCHANGE BILIARY DRAIN  10/10/2021   IR EXCHANGE BILIARY DRAIN  12/05/2021   IR EXCHANGE BILIARY DRAIN  01/19/2022   IR EXCHANGE BILIARY DRAIN  10/13/2022   IR EXCHANGE BILIARY DRAIN  11/24/2022   IR EXCHANGE BILIARY DRAIN  01/18/2023   IR EXCHANGE BILIARY DRAIN  03/13/2023   IR EXCHANGE BILIARY DRAIN  04/24/2023   IR EXCHANGE BILIARY DRAIN  06/12/2023   IR INT EXT BILIARY DRAIN WITH  CHOLANGIOGRAM  07/17/2022   IR RADIOLOGIST EVAL & MGMT  03/01/2022   IR RADIOLOGIST EVAL & MGMT  08/17/2022    Social History   Socioeconomic History   Marital status: Married    Spouse name: Pasco   Number of children: 4   Years of education: Not on file   Highest education level: Not on file  Occupational History   Not on file  Tobacco Use   Smoking status: Never   Smokeless tobacco: Never  Vaping Use   Vaping status: Never Used  Substance and Sexual Activity   Alcohol use: No   Drug use: No   Sexual activity: Yes  Other Topics Concern   Not on file  Social History Narrative   Lives with spouse at home: Pasco has Parkinson's. No indoor pets.    Social Drivers of Corporate Investment Banker Strain: Low Risk  (02/06/2023)   Overall Financial Resource Strain (CARDIA)    Difficulty of Paying Living Expenses: Not hard at all  Food Insecurity: No Food Insecurity (02/06/2023)   Hunger Vital Sign    Worried About Running Out of Food in the Last Year: Never true    Ran Out of Food in the Last Year: Never true  Transportation Needs: No Transportation Needs (02/06/2023)   PRAPARE - Administrator, Civil Service (Medical): No    Lack of Transportation (Non-Medical): No  Physical Activity: Inactive (02/06/2023)   Exercise Vital Sign    Days of Exercise per Week: 0 days    Minutes of Exercise per Session: 0 min  Stress: No Stress Concern Present (11/24/2021)   [employer] of Occupational Health - Occupational Stress Questionnaire    Feeling of Stress : Only a little  Social Connections: Moderately Isolated (02/06/2023)   Social Connection and Isolation Panel [NHANES]    Frequency of Communication with Friends and Family: More than three times a week    Frequency of Social Gatherings with Friends and Family: Never    Attends Religious Services: Never    Database Administrator or Organizations: No    Attends Banker Meetings: Never    Marital Status:  Married  Catering Manager Violence: Not At Risk (02/06/2023)   Humiliation, Afraid, Rape, and Kick questionnaire    Fear of Current or Ex-Partner: No    Emotionally Abused: No    Physically Abused: No    Sexually Abused: No    Family History  Problem Relation Age of Onset   Hypertension Mother    CVA Mother    Ulcers Mother    Emphysema Mother    Heart disease Father    Prostate cancer Father    Macular degeneration Father    Hypertension Sister    Diabetes Sister    Cervical polyp Sister    Hypertension Sister    Diabetes Sister    Lymphoma Sister      Current Outpatient Medications:    levofloxacin  (LEVAQUIN ) 250 MG tablet, Take 2 tablets (500 mg total) by mouth daily., Disp: 60 tablet, Rfl:  0   acetaminophen  (TYLENOL ) 325 MG tablet, Take 325 mg by mouth every 6 (six) hours as needed (for pain and sometimes takes 2 if pain is worse)., Disp: , Rfl:    baclofen  (LIORESAL ) 10 MG tablet, Take 10 mg by mouth 3 (three) times daily as needed., Disp: , Rfl:    letrozole  (FEMARA ) 2.5 MG tablet, Take 1 tablet by mouth once daily, Disp: 90 tablet, Rfl: 0   levothyroxine  (SYNTHROID ) 88 MCG tablet, Take 1 tablet by mouth once daily, Disp: 90 tablet, Rfl: 0   Multiple Vitamins-Minerals (CENTRUM VITAMINTS PO), Take 1 tablet by mouth daily., Disp: , Rfl:    ondansetron  (ZOFRAN ) 4 MG tablet, Take 1 tablet (4 mg total) by mouth daily as needed for nausea or vomiting., Disp: 30 tablet, Rfl: 2   oxyCODONE  (ROXICODONE ) 5 MG immediate release tablet, Take 1 tablet (5 mg total) by mouth every 4 (four) hours as needed for severe pain., Disp: 30 tablet, Rfl: 0   palbociclib  (IBRANCE ) 75 MG tablet, Take 1 tablet (75 mg total) by mouth daily. Take for 21 days on, 7 days off, repeat every 28 days., Disp: 21 tablet, Rfl: 0   pantoprazole  (PROTONIX ) 40 MG tablet, Take 1 tablet by mouth once daily, Disp: 90 tablet, Rfl: 0   potassium chloride  SA (KLOR-CON  M) 20 MEQ tablet, Take 1 tablet (20 mEq total) by  mouth daily., Disp: 30 tablet, Rfl: 3   prochlorperazine  (COMPAZINE ) 5 MG tablet, Take 1 tablet (5 mg total) by mouth every 6 (six) hours as needed for refractory nausea / vomiting., Disp: 30 tablet, Rfl: 1   traZODone  (DESYREL ) 50 MG tablet, Take 50 mg by mouth at bedtime., Disp: , Rfl:  No current facility-administered medications for this visit.  Facility-Administered Medications Ordered in Other Visits:    cefTRIAXone  (ROCEPHIN ) 2 g in dextrose  5 % 50 mL IVPB, 2 g, Intravenous, Once, Caperilla, Marissa N, PA  Physical exam:  Vitals:   07/17/23 1158  BP: 132/73  Pulse: 88  Resp: 18  Temp: (!) 101.9 F (38.8 C)  TempSrc: Tympanic  SpO2: 97%  Weight: 135 lb 1.6 oz (61.3 kg)   Physical Exam Cardiovascular:     Rate and Rhythm: Normal rate and regular rhythm.     Heart sounds: Normal heart sounds.  Pulmonary:     Effort: Pulmonary effort is normal.     Breath sounds: Normal breath sounds.  Abdominal:     General: Bowel sounds are normal. There is no distension.     Palpations: Abdomen is soft.     Tenderness: There is no abdominal tenderness.     Comments: Dressing in place over PTCA which is draining clear biliary drainage  Skin:    General: Skin is warm and dry.  Neurological:     Mental Status: She is alert and oriented to person, place, and time.         Latest Ref Rng & Units 07/17/2023   11:30 AM  CMP  Glucose 70 - 99 mg/dL 879   BUN 8 - 23 mg/dL 20   Creatinine 9.55 - 1.00 mg/dL 8.81   Sodium 864 - 854 mmol/L 136   Potassium 3.5 - 5.1 mmol/L 4.2   Chloride 98 - 111 mmol/L 103   CO2 22 - 32 mmol/L 22   Calcium  8.9 - 10.3 mg/dL 9.0   Total Protein 6.5 - 8.1 g/dL 7.7   Total Bilirubin 0.0 - 1.2 mg/dL 0.8   Alkaline Phos 38 -  126 U/L 94   AST 15 - 41 U/L 32   ALT 0 - 44 U/L 41       Latest Ref Rng & Units 07/17/2023   11:30 AM  CBC  WBC 4.0 - 10.5 K/uL 7.7   Hemoglobin 12.0 - 15.0 g/dL 88.0   Hematocrit 63.9 - 46.0 % 35.7   Platelets 150 - 400 K/uL 281      No images are attached to the encounter.  NM Bone Scan Whole Body Result Date: 07/06/2023 CLINICAL DATA:  Metastatic breast cancer EXAM: NUCLEAR MEDICINE WHOLE BODY BONE SCAN TECHNIQUE: Whole body anterior and posterior images were obtained approximately 3 hours after intravenous injection of radiopharmaceutical. RADIOPHARMACEUTICALS:  22.14 mCi Technetium-51m MDP IV COMPARISON:  01/08/2023 and older FINDINGS: Physiologic distribution radiotracer along the kidneys and bladder. Areas of degenerative uptake along the shoulders, ankles and feet, knees. No additional areas of abnormal uptake to suggest osseous metastatic disease. Slight curvature of the spine. IMPRESSION: No scintigraphic evidence of osseous metastatic disease. Electronically Signed   By: Ranell Bring M.D.   On: 07/06/2023 12:08   CT CHEST ABDOMEN PELVIS W CONTRAST Result Date: 07/06/2023 CLINICAL DATA:  Breast cancer metastases.  * Tracking Code: BO * EXAM: CT CHEST, ABDOMEN, AND PELVIS WITH CONTRAST TECHNIQUE: Multidetector CT imaging of the chest, abdomen and pelvis was performed following the standard protocol during bolus administration of intravenous contrast. RADIATION DOSE REDUCTION: This exam was performed according to the departmental dose-optimization program which includes automated exposure control, adjustment of the mA and/or kV according to patient size and/or use of iterative reconstruction technique. CONTRAST:  80mL OMNIPAQUE  IOHEXOL  300 MG/ML  SOLN COMPARISON:  CT 01/08/2023 and older.  Bone scans as well. FINDINGS: CT CHEST FINDINGS Cardiovascular: Trace pericardial fluid. The heart is enlarged. Coronary calcifications are seen. The thoracic aorta is normal course and caliber with some mild atherosclerotic calcified plaque. Mediastinum/Nodes: Stable 16 mm left thyroid  nodule. Please correlate for any known history. This has been present since at least March 2023. Normal caliber thoracic esophagus. No specific abnormal  lymph node enlargement identified in the axillary regions, hilum or mediastinum. Also no clear abnormal nodes in the internal mammary chain, chest wall or supraclavicular regions. Lungs/Pleura: Bandlike opacity seen along the lung bases, right-greater-than-left with bronchiectasis. Scarring and atelectatic changes. Small areas as well in the middle lobe and lingula. No new consolidation, pneumothorax or effusion. No dominant lung nodule. Musculoskeletal: Slight curvature and degenerative changes along the spine. CT ABDOMEN PELVIS FINDINGS Hepatobiliary: PTC tube in place via a right lateral approach. Pigtail extends duodenal third portion. Previous cholecystectomy. There is dilatation of the biliary tree diffusely with some air in the biliary tree. Appearance is similar to previous. Patent portal vein. No space-occupying liver lesion. Pancreas: Global mild atrophy of the pancreas with severe pancreatic duct dilatation, similar to previous. No obvious pancreatic mass Spleen: Normal in size without focal abnormality. Adrenals/Urinary Tract: Stable adrenal glands. No enhancing renal mass or collecting system dilatation. Stomach/Bowel: Stomach is nondilated. There is a gastrojejunostomy. Small bowel overall is nondilated. There is some distal small bowel stool appearance, nonspecific. Large bowel has a normal caliber. Scattered stool. Some redundancy to the course of the descending colon. Few sigmoid colon diverticula. Normal appendix in the right lower quadrant. Vascular/Lymphatic: Aortic atherosclerosis. No enlarged abdominal or pelvic lymph nodes. Reproductive: Uterus and bilateral adnexa are unremarkable. Other: No abdominal wall hernia or abnormality. No abdominopelvic ascites. Musculoskeletal: Scattered degenerative changes. IMPRESSION: Overall no significant change. No  developing new mass lesion, fluid collection or lymph node enlargement. PTC catheter in place along liver extending into the duodenal third  portion. Persistent biliary ductal and pancreatic ductal dilatation. Stable surgical changes from gastrojejunostomy. Please correlate with clinical history. Electronically Signed   By: Ranell Bring M.D.   On: 07/06/2023 12:06     Assessment and plan- Patient is a 68 y.o. female with metastatic ER/PR positive HER2 negative lobular breast cancer presenting with duodenal obstruction and obstructive jaundice s/p palliative gastrojejunostomy.  She is here for a routine follow-up visit of breast cancer presently on letrozole  plus Ibrance   CT chest abdomen pelvis with Contrast and bone scan that was done on 07/02/2023 did not show any evidence of recurrent or progressive disease.  Plan is to continue with Ibrance  plus letrozole  until progression or toxicity.  Plan labs including CBC with differential and CMP are overall unremarkable.  Fever: Etiology unclear.  Clinically no localizing signs of infection.  However I am giving her an empiric prescription for Levaquin  500 mg daily for 7 days should her fever persist in the next 12 to 24 hours.  I also asked her to hold off on taking Ibrance  for the next 1 week.  She will let us  know how she is doing by next week.  I will see her back in 3 months with CBC with differential CMP CA 27-29 and CA 15-3   Visit Diagnosis 1. 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 High risk medication use   3. Use of letrozole  (Femara )      Dr. Annah Skene, MD, MPH [facility] [phone] 07/17/2023 1:09 PM

## 2023-07-18 ENCOUNTER — Other Ambulatory Visit: Payer: Self-pay | Admitting: Oncology

## 2023-07-18 LAB — CANCER ANTIGEN 15-3: CA 15-3: 22.3 U/mL (ref 0.0–25.0)

## 2023-07-18 LAB — CANCER ANTIGEN 27.29: CA 27.29: 18.9 U/mL (ref 0.0–38.6)

## 2023-07-24 ENCOUNTER — Other Ambulatory Visit: Payer: Self-pay

## 2023-07-24 ENCOUNTER — Other Ambulatory Visit: Payer: Self-pay | Admitting: Oncology

## 2023-07-24 DIAGNOSIS — C50912 Malignant neoplasm of unspecified site of left female breast: Secondary | ICD-10-CM

## 2023-07-24 NOTE — Progress Notes (Signed)
Specialty Pharmacy Refill Coordination Note  Diamond Hinton is a 68 y.o. female contacted today regarding refills of specialty medication(s) Palbociclib Ilda Foil)   Patient requested Delivery   Delivery date: 08/01/23   Verified address: [address] [place]   Medication will be filled on 02.18.25.     This fill date is pending response to refill request from provider. Patient is aware and if they have not received fill by intended date they must follow up with pharmacy.

## 2023-07-25 ENCOUNTER — Other Ambulatory Visit: Payer: Self-pay

## 2023-07-26 ENCOUNTER — Other Ambulatory Visit: Payer: Self-pay

## 2023-07-26 ENCOUNTER — Other Ambulatory Visit: Payer: Self-pay | Admitting: Oncology

## 2023-07-26 DIAGNOSIS — C50912 Malignant neoplasm of unspecified site of left female breast: Secondary | ICD-10-CM

## 2023-07-27 ENCOUNTER — Other Ambulatory Visit: Payer: Self-pay

## 2023-07-27 MED ORDER — PALBOCICLIB 75 MG PO TABS
75.0000 mg | ORAL_TABLET | Freq: Every day | ORAL | 0 refills | Status: DC
Start: 2023-07-27 — End: 2023-09-25
  Filled 2023-07-27: qty 21, 28d supply, fill #0

## 2023-07-30 ENCOUNTER — Other Ambulatory Visit: Payer: Self-pay

## 2023-07-30 NOTE — Progress Notes (Signed)
Patient was contacted via mychart that due to possible impending winter storm, medication will arrive on Tuesday 07/31/23.

## 2023-08-01 ENCOUNTER — Other Ambulatory Visit: Payer: Self-pay | Admitting: Interventional Radiology

## 2023-08-01 DIAGNOSIS — K831 Obstruction of bile duct: Secondary | ICD-10-CM

## 2023-08-08 ENCOUNTER — Other Ambulatory Visit: Payer: Self-pay | Admitting: Radiology

## 2023-08-08 NOTE — H&P (Signed)
 Chief Complaint: Patient was seen in consultation today for biliary obstruction   Referring Physician(s): Dr. Prien Robert  Supervising Physician: Marliss Coots  Patient Status: [facility] - Out-pt  History of Present Illness: Diamond Hinton is a 68 y.o. 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 She is well-known to IR from initial biliary drain placement 08/31/21. A covered stent was placed into the common bile duct 02/08/22 but unfortunately she developed recurrent biliary obstruction. IR placed an additional 12 Fr right-sided internal/external biliary drain 07/17/22 with balloon angioplasty and drain upsize to a 14 Fr on 08/22/22.  She has undergone routine drain exchanges with moderate sedation on 01/18/23, 03/13/23, 04/24/23 and 06/12/23. She presents today for 6 week drain exchange.  Patient is resting comfortably in bed with her daughter at the bedside. States that she was having intermittent fevers of unknown etiology for the past few months any time she would get overly stressed. Has been following with Dr. Sardina Robert for this who placed her on an Levaquin 500mg  daily for 7 days. Last dose was on 08/01/23. Patient denies any symptoms at this time. All questions and concerns were answered at the bedside.   Past Medical History:  Diagnosis Date   Anemia    Anxiety    Breast cancer (HCC)    Gallstones    GERD (gastroesophageal reflux disease)    Hyperlipidemia    Hypertension    Jaundice 08/29/2021   Rash 08/29/2021   Right upper quadrant abdominal pain 08/29/2021   UTI (urinary tract infection) 08/29/2021    Past Surgical History:  Procedure Laterality Date   BILATERAL SALPINGECTOMY  04/03/1997   CHOLECYSTECTOMY N/A 07/07/2015   Procedure: LAPAROSCOPIC CHOLECYSTECTOMY ;  Surgeon: Leafy Ro, MD;  Location: [facility] ORS;  Service: General;  Laterality: N/A;    ESOPHAGOGASTRODUODENOSCOPY  08/30/2021   Procedure: ESOPHAGOGASTRODUODENOSCOPY (EGD);  Surgeon: Midge Minium, MD;  Location: [facility] ENDOSCOPY;  Service: Endoscopy;;   GALLBLADDER SURGERY  07/07/2015   [facility]   IR BILIARY DILITATION  08/22/2022   IR BILIARY DRAIN PLACEMENT WITH CHOLANGIOGRAM  08/31/2021   IR BILIARY STENT(S) NEW ACCESS WITH DRAIN  02/08/2022   IR CHOLANGIOGRAM EXISTING TUBE  08/17/2022   IR CONVERT BILIARY DRAIN TO INT EXT BILIARY DRAIN  09/02/2021   IR EXCHANGE BILIARY DRAIN  10/10/2021   IR EXCHANGE BILIARY DRAIN  12/05/2021   IR EXCHANGE BILIARY DRAIN  01/19/2022   IR EXCHANGE BILIARY DRAIN  10/13/2022   IR EXCHANGE BILIARY DRAIN  11/24/2022   IR EXCHANGE BILIARY DRAIN  01/18/2023   IR EXCHANGE BILIARY DRAIN  03/13/2023   IR EXCHANGE BILIARY DRAIN  04/24/2023   IR EXCHANGE BILIARY DRAIN  06/12/2023   IR INT EXT BILIARY DRAIN WITH CHOLANGIOGRAM  07/17/2022   IR RADIOLOGIST EVAL & MGMT  03/01/2022   IR RADIOLOGIST EVAL & MGMT  08/17/2022    Allergies: Peanuts [peanut oil], Levaquin [levofloxacin], and Flexeril [cyclobenzaprine]  Medications: Prior to Admission medications   Medication Sig Start Date End Date Taking? Authorizing Provider  acetaminophen (TYLENOL) 325 MG tablet Take 325 mg by mouth every 6 (six) hours as needed (for pain and sometimes takes 2 if pain is worse).    [provider]  baclofen (LIORESAL) 10 MG tablet Take 10 mg by mouth 3 (three) times daily as needed.    [provider]  letrozole (FEMARA) 2.5 MG tablet Take 1 tablet  by mouth once daily 07/18/23   Creig Hines, MD  levofloxacin (LEVAQUIN) 250 MG tablet Take 2 tablets (500 mg total) by mouth daily. 07/17/23   Creig Hines, MD  levothyroxine (SYNTHROID) 88 MCG tablet Take 1 tablet by mouth once daily 06/19/23   Erasmo Downer, MD  Multiple Vitamins-Minerals (CENTRUM VITAMINTS PO) Take 1 tablet by mouth daily.    [provider]  ondansetron (ZOFRAN) 4 MG tablet Take 1 tablet (4 mg  total) by mouth daily as needed for nausea or vomiting. 01/16/23 01/16/24  Creig Hines, MD  oxyCODONE (ROXICODONE) 5 MG immediate release tablet Take 1 tablet (5 mg total) by mouth every 4 (four) hours as needed for severe pain. 07/24/22   Bacigalupo, Marzella Schlein, MD  palbociclib Ilda Foil) 75 MG tablet Take 1 tablet (75 mg total) by mouth daily. Take for 21 days on, 7 days off, repeat every 28 days. 06/19/23   Alinda Dooms, NP  palbociclib Ilda Foil) 75 MG tablet Take 1 tablet (75 mg total) by mouth daily. Take for 21 days on, 7 days off, repeat every 28 days. 07/27/23   Creig Hines, MD  pantoprazole (PROTONIX) 40 MG tablet Take 1 tablet by mouth once daily 06/26/23   Erasmo Downer, MD  potassium chloride SA (KLOR-CON M) 20 MEQ tablet Take 1 tablet (20 mEq total) by mouth daily. 01/16/23   Creig Hines, MD  prochlorperazine (COMPAZINE) 5 MG tablet Take 1 tablet (5 mg total) by mouth every 6 (six) hours as needed for refractory nausea / vomiting. 02/14/22   Creig Hines, MD  traZODone (DESYREL) 50 MG tablet Take 50 mg by mouth at bedtime.    [provider]     Family History  Problem Relation Age of Onset   Hypertension Mother    CVA Mother    Ulcers Mother    Emphysema Mother    Heart disease Father    Prostate cancer Father    Macular degeneration Father    Hypertension Sister    Diabetes Sister    Cervical polyp Sister    Hypertension Sister    Diabetes Sister    Lymphoma Sister     Social History   Socioeconomic History   Marital status: Married    Spouse name: [name]   Number of children: 4   Years of education: Not on file   Highest education level: Not on file  Occupational History   Not on file  Tobacco Use   Smoking status: Never   Smokeless tobacco: Never  Vaping Use   Vaping status: Never Used  Substance and Sexual Activity   Alcohol use: No   Drug use: No   Sexual activity: Yes  Other Topics Concern   Not on file  Social History Narrative    Lives with spouse at home: [name] has Parkinson's. No indoor pets.    Social Drivers of Corporate investment banker Strain: Low Risk  (02/06/2023)   Overall Financial Resource Strain (CARDIA)    Difficulty of Paying Living Expenses: Not hard at all  Food Insecurity: No Food Insecurity (02/06/2023)   Hunger Vital Sign    Worried About Running Out of Food in the Last Year: Never true    Ran Out of Food in the Last Year: Never true  Transportation Needs: No Transportation Needs (02/06/2023)   PRAPARE - Administrator, Civil Service (Medical): No    Lack of Transportation (Non-Medical): No  Physical Activity: Inactive (02/06/2023)   Exercise Vital Sign    Days of Exercise per Week: 0 days    Minutes of Exercise per Session: 0 min  Stress: No Stress Concern Present (11/24/2021)   [employer] of Occupational Health - Occupational Stress Questionnaire    Feeling of Stress : Only a little  Social Connections: Moderately Isolated (02/06/2023)   Social Connection and Isolation Panel [NHANES]    Frequency of Communication with Friends and Family: More than three times a week    Frequency of Social Gatherings with Friends and Family: Never    Attends Religious Services: Never    Database administrator or Organizations: No    Attends Engineer, structural: Never    Marital Status: Married    Review of Systems No headache, shortness of breath, chest pain, abdominal pain, nausea/vomiting, fever/chills. All other ROS negative.   Vital Signs: BP 132/82   Pulse 81   Temp 98.3 F (36.8 C) (Oral)   Ht 5\' 3"  (1.6 m)   Wt 135 lb (61.2 kg)   SpO2 99%   BMI 23.91 kg/m   Physical Exam Vitals reviewed.  Constitutional:      Appearance: Normal appearance.  HENT:     Head: Normocephalic and atraumatic.     Mouth/Throat:     Mouth: Mucous membranes are moist.     Pharynx: Oropharynx is clear.  Cardiovascular:     Rate and Rhythm: Normal rate and regular rhythm.      Heart sounds: Normal heart sounds.  Pulmonary:     Effort: Pulmonary effort is normal.     Breath sounds: Normal breath sounds.  Abdominal:     General: Abdomen is flat.     Palpations: Abdomen is soft.     Tenderness: There is no abdominal tenderness.     Comments: Biliary drain in place to gravity with minimal bilious output in bag  Musculoskeletal:        General: Normal range of motion.     Cervical back: Normal range of motion.  Skin:    General: Skin is warm and dry.  Neurological:     General: No focal deficit present.     Mental Status: She is alert and oriented to person, place, and time. Mental status is at baseline.  Psychiatric:        Mood and Affect: Mood normal.        Behavior: Behavior normal.        Judgment: Judgment normal.     Imaging: No results found.  Labs:  CBC: Recent Labs    02/16/23 0930 05/02/23 1445 06/12/23 0806 07/17/23 1130  WBC 5.5 7.0 6.3 7.7  HGB 11.8* 13.6 12.0 11.9*  HCT 35.6* 41.2 35.8* 35.7*  PLT 222 328 301 281    COAGS: Recent Labs    08/22/22 1125 10/13/22 1313 11/06/22 0520  INR 1.1 1.2 1.2  APTT  --   --  28    BMP: Recent Labs    05/02/23 1445 05/16/23 1054 06/12/23 0806 07/17/23 1130  NA 135 133* 139 136  K 4.9 3.9 3.8 4.2  CL 103 105 108 103  CO2 21* 19* 20* 22  GLUCOSE 186* 161* 125* 120*  BUN 28* 31* 26* 20  CALCIUM 9.5 9.3 9.1 9.0  CREATININE 1.55* 1.31* 1.12* 1.18*  GFRNONAA 36* 44* 53* 50*    LIVER FUNCTION TESTS: Recent Labs    02/16/23 0930 05/02/23 1445 06/12/23 0806 07/17/23 1130  BILITOT 0.7 0.6 0.3 0.8  AST 37 18 31 32  ALT 49* 22 31 41  ALKPHOS 157* 85 135* 94  PROT 7.6 8.3* 7.2 7.7  ALBUMIN 3.8 4.4 3.5 4.1    TUMOR MARKERS: No results for input(s): "AFPTM", "CEA", "CA199", "CHROMGRNA" in the last 8760 hours.  Assessment and Plan:  Metastatic breast cancer with biliary obstruction s/p drain placement: Tobias Alexander, 68 year old female, presents today to the [facility] Interventional Radiology department for an image-guided routine biliary drain exchange with moderate sedation.   Plan for biliary drain exchange with Dr. Andrey Campanile on 08/09/23  Risks and benefits discussed with the patient including bleeding, infection, damage to adjacent structures, bowel perforation/fistula connection, and sepsis.  All of the patient's questions were answered, patient is agreeable to proceed. She has been NPO. She is a full code.   Consent signed and in chart.  Thank you for this interesting consult.  I greatly enjoyed meeting KATHALENE SPORER and look forward to participating in their care.  A copy of this report was sent to the requesting provider on this date.  Electronically Signed: Jama Flavors, PA-C 08/09/2023, 8:44 AM     I spent a total of  30 Minutes   in face to face in clinical consultation, greater than 50% of which was counseling/coordinating care for biliary obstruction.

## 2023-08-08 NOTE — Progress Notes (Signed)
 Patient for IR Chole Tube Exc w/ mod sedation on Thurs 08/09/23, I called and spoke with the patient on the phone and gave pre-procedure instructions. Pt was made aware to be here at 7:30a, NPO after MN prior to procedure as well as driver post procedure/recovery/discharge. Pt stated understanding.  Called 08/08/23

## 2023-08-09 ENCOUNTER — Other Ambulatory Visit: Payer: Self-pay | Admitting: Interventional Radiology

## 2023-08-09 ENCOUNTER — Other Ambulatory Visit: Payer: Self-pay

## 2023-08-09 ENCOUNTER — Encounter: Payer: Self-pay | Admitting: Interventional Radiology

## 2023-08-09 ENCOUNTER — Encounter: Payer: Self-pay | Admitting: Radiology

## 2023-08-09 ENCOUNTER — Ambulatory Visit
Admission: RE | Admit: 2023-08-09 | Discharge: 2023-08-09 | Disposition: A | Discharge status: Home / Self Care | Payer: Medicare HMO | Source: Ambulatory Visit | Attending: Interventional Radiology | Admitting: Interventional Radiology

## 2023-08-09 DIAGNOSIS — K831 Obstruction of bile duct: Secondary | ICD-10-CM

## 2023-08-09 DIAGNOSIS — E119 Type 2 diabetes mellitus without complications: Secondary | ICD-10-CM | POA: Diagnosis not present

## 2023-08-09 DIAGNOSIS — Z434 Encounter for attention to other artificial openings of digestive tract: Secondary | ICD-10-CM | POA: Insufficient documentation

## 2023-08-09 DIAGNOSIS — Z853 Personal history of malignant neoplasm of breast: Secondary | ICD-10-CM | POA: Diagnosis not present

## 2023-08-09 DIAGNOSIS — I10 Essential (primary) hypertension: Secondary | ICD-10-CM | POA: Insufficient documentation

## 2023-08-09 DIAGNOSIS — Z436 Encounter for attention to other artificial openings of urinary tract: Secondary | ICD-10-CM | POA: Diagnosis not present

## 2023-08-09 HISTORY — PX: IR EXCHANGE BILIARY DRAIN: IMG6046

## 2023-08-09 MED ORDER — MIDAZOLAM HCL 2 MG/2ML IJ SOLN
INTRAMUSCULAR | Status: AC
  Filled 2023-08-09: qty 2

## 2023-08-09 MED ORDER — SODIUM CHLORIDE 0.9 % IV SOLN: INTRAVENOUS | Status: DC

## 2023-08-09 MED ORDER — FENTANYL CITRATE (PF) 100 MCG/2ML IJ SOLN
INTRAMUSCULAR | Status: AC
  Filled 2023-08-09: qty 2

## 2023-08-09 MED ORDER — MIDAZOLAM HCL 2 MG/2ML IJ SOLN
INTRAMUSCULAR | Status: AC | PRN
  Administered 2023-08-09: 1 mg via INTRAVENOUS
  Administered 2023-08-09: .5 mg via INTRAVENOUS

## 2023-08-09 MED ORDER — LIDOCAINE HCL 1 % IJ SOLN
INTRAMUSCULAR | Status: AC
  Filled 2023-08-09: qty 20

## 2023-08-09 MED ORDER — LIDOCAINE HCL 1 % IJ SOLN
6.0000 mL | Freq: Once | INTRAMUSCULAR | Status: AC
  Administered 2023-08-09: 6 mL via INTRADERMAL

## 2023-08-09 MED ORDER — SODIUM CHLORIDE 0.9 % IV SOLN
2.0000 g | INTRAVENOUS | Status: AC
  Administered 2023-08-09: 2 g via INTRAVENOUS
  Filled 2023-08-09: qty 20

## 2023-08-09 MED ORDER — SODIUM CHLORIDE 0.9% FLUSH: 5.0000 mL | Freq: Three times a day (TID) | INTRAVENOUS | Status: DC

## 2023-08-09 MED ORDER — IOHEXOL 300 MG/ML  SOLN
8.0000 mL | Freq: Once | INTRAMUSCULAR | Status: AC | PRN
  Administered 2023-08-09: 8 mL

## 2023-08-09 MED ORDER — FENTANYL CITRATE (PF) 100 MCG/2ML IJ SOLN
INTRAMUSCULAR | Status: AC | PRN
  Administered 2023-08-09: 50 ug via INTRAVENOUS
  Administered 2023-08-09: 25 ug via INTRAVENOUS

## 2023-08-09 NOTE — Procedures (Signed)
 Interventional Radiology Procedure Note  Procedure: Biliary drain exchange  Findings: Please refer to procedural dictation for full description. 14 Fr with 7 cm extra cut side holes, to bag drainage.  Complications: None immediate  Estimated Blood Loss: <5 mL  Recommendations: Continue current drain care. Follow up in 2 months for routine exchange.   Marliss Coots, MD

## 2023-08-20 ENCOUNTER — Ambulatory Visit (INDEPENDENT_AMBULATORY_CARE_PROVIDER_SITE_OTHER): Admitting: Family Medicine

## 2023-08-20 ENCOUNTER — Encounter: Payer: Self-pay | Admitting: Family Medicine

## 2023-08-20 VITALS — BP 128/76 | HR 74 | Ht 63.0 in | Wt 132.6 lb

## 2023-08-20 DIAGNOSIS — E1122 Type 2 diabetes mellitus with diabetic chronic kidney disease: Secondary | ICD-10-CM

## 2023-08-20 DIAGNOSIS — E1159 Type 2 diabetes mellitus with other circulatory complications: Secondary | ICD-10-CM | POA: Diagnosis not present

## 2023-08-20 DIAGNOSIS — E785 Hyperlipidemia, unspecified: Secondary | ICD-10-CM | POA: Diagnosis not present

## 2023-08-20 DIAGNOSIS — E039 Hypothyroidism, unspecified: Secondary | ICD-10-CM

## 2023-08-20 DIAGNOSIS — I152 Hypertension secondary to endocrine disorders: Secondary | ICD-10-CM

## 2023-08-20 DIAGNOSIS — E1169 Type 2 diabetes mellitus with other specified complication: Secondary | ICD-10-CM | POA: Diagnosis not present

## 2023-08-20 DIAGNOSIS — N1831 Chronic kidney disease, stage 3a: Secondary | ICD-10-CM | POA: Insufficient documentation

## 2023-08-20 NOTE — Progress Notes (Signed)
 Established patient visit   Patient: Diamond Hinton   DOB: 10-16-1955   67 y.o. Female  MRN: [id number] Visit Date: 08/20/2023  Today's healthcare provider: Shirlee Latch, MD   Chief Complaint  Patient presents with   Medical Management of Chronic Issues    Patient reports taking medications as prescribed   Hyperlipidemia   Hypertension   Hypothyroidism   Diabetes   Care Management    Ophthalmology exam - results requested Mammogram and Colonoscopy declined   Subjective    HPI HPI     Medical Management of Chronic Issues    Additional comments: Patient reports taking medications as prescribed        Care Management    Additional comments: Ophthalmology exam - results requested Mammogram and Colonoscopy declined      Last edited by Acey Lav, CMA on 08/20/2023  4:00 PM.       Discussed the use of AI scribe software for clinical note transcription with the patient, who gave verbal consent to proceed.  History of Present Illness   The patient, with a history of stage 4 breast cancer, diabetes, and chronic kidney disease, presents for a routine check-up. The patient has a biliary drain tube due to biliary obstruction, which is changed every six to eight weeks. The patient reports that she caps off the tube when she goes out and re-hooks it when she gets home. The patient notes that the amount of drainage varies from day to day, with some days having minimal external drainage and other days filling the bag.  The patient also reports episodes of fever, chills, and muscle aches that last a few hours and seem to occur after periods of stress. The patient notes that these episodes have been occurring every three to four weeks and that she can feel it starting in her spine. During these episodes, the patient's fever can reach up to 102 degrees Fahrenheit. The patient notes that these episodes seem to resolve on their own without intervention.  The patient is  also on Synthroid for thyroid issues and reports that she is on the lowest dose of Ibrance for her breast cancer. The patient notes that she has been tolerating carbohydrates well, despite her diabetes, as she finds it is what she can tolerate best in terms of her appetite.         Medications: Outpatient Medications Prior to Visit  Medication Sig   acetaminophen (TYLENOL) 325 MG tablet Take 325 mg by mouth every 6 (six) hours as needed (for pain and sometimes takes 2 if pain is worse).   baclofen (LIORESAL) 10 MG tablet Take 10 mg by mouth 3 (three) times daily as needed.   letrozole (FEMARA) 2.5 MG tablet Take 1 tablet by mouth once daily   levofloxacin (LEVAQUIN) 250 MG tablet Take 2 tablets (500 mg total) by mouth daily.   levothyroxine (SYNTHROID) 88 MCG tablet Take 1 tablet by mouth once daily   Multiple Vitamins-Minerals (CENTRUM VITAMINTS PO) Take 1 tablet by mouth daily.   ondansetron (ZOFRAN) 4 MG tablet Take 1 tablet (4 mg total) by mouth daily as needed for nausea or vomiting.   oxyCODONE (ROXICODONE) 5 MG immediate release tablet Take 1 tablet (5 mg total) by mouth every 4 (four) hours as needed for severe pain.   palbociclib (IBRANCE) 75 MG tablet Take 1 tablet (75 mg total) by mouth daily. Take for 21 days on, 7 days off, repeat every 28 days.  palbociclib (IBRANCE) 75 MG tablet Take 1 tablet (75 mg total) by mouth daily. Take for 21 days on, 7 days off, repeat every 28 days.   pantoprazole (PROTONIX) 40 MG tablet Take 1 tablet by mouth once daily   prochlorperazine (COMPAZINE) 5 MG tablet Take 1 tablet (5 mg total) by mouth every 6 (six) hours as needed for refractory nausea / vomiting.   traZODone (DESYREL) 50 MG tablet Take 50 mg by mouth at bedtime.   potassium chloride SA (KLOR-CON M) 20 MEQ tablet Take 1 tablet (20 mEq total) by mouth daily. (Patient not taking: Reported on 08/20/2023)   Facility-Administered Medications Prior to Visit  Medication Dose Route Frequency  Provider   cefTRIAXone (ROCEPHIN) 2 g in dextrose 5 % 50 mL IVPB  2 g Intravenous Once Caperilla, Marissa N, PA    Review of Systems     Objective    BP 128/76 (BP Location: Left Arm, Patient Position: Sitting, Cuff Size: Normal)   Pulse 74   Ht 5\' 3"  (1.6 m)   Wt 132 lb 9.6 oz (60.1 kg)   SpO2 98%   BMI 23.49 kg/m    Physical Exam Vitals reviewed.  Constitutional:      General: She is not in acute distress.    Appearance: Normal appearance. She is well-developed. She is not diaphoretic.  HENT:     Head: Normocephalic and atraumatic.  Eyes:     General: No scleral icterus.    Conjunctiva/sclera: Conjunctivae normal.  Neck:     Thyroid: No thyromegaly.  Cardiovascular:     Rate and Rhythm: Normal rate and regular rhythm.     Heart sounds: Normal heart sounds. No murmur heard. Pulmonary:     Effort: Pulmonary effort is normal. No respiratory distress.     Breath sounds: Normal breath sounds. No wheezing, rhonchi or rales.  Musculoskeletal:     Cervical back: Neck supple.     Right lower leg: No edema.     Left lower leg: No edema.  Lymphadenopathy:     Cervical: No cervical adenopathy.  Skin:    General: Skin is warm and dry.     Findings: No rash.  Neurological:     Mental Status: She is alert and oriented to person, place, and time. Mental status is at baseline.  Psychiatric:        Mood and Affect: Mood normal.        Behavior: Behavior normal.      No results found for any visits on 08/20/23.  Assessment & Plan     Problem List Items Addressed This Visit       Cardiovascular and Mediastinum   Hypertension associated with diabetes (HCC)   Ok to continue to hold HCTZ Continue to monitor BPs and resume if sBP >140 Well controlled Reviewed recent metabolic panel        Endocrine   Hyperlipidemia associated with type 2 diabetes mellitus (HCC)   Reviewed last lipid panel Not currently on a statin - hesitant to take given other medication  SEs Recheck FLP and CMP Discussed diet and exercise       Relevant Orders   Lipid panel   Adult hypothyroidism   Managed with Synthroid 88 mcg. Labs will be checked to ensure appropriate dosing. - Check thyroid function tests to assess Synthroid dosing      Relevant Orders   TSH   Diabetes mellitus (HCC) - Primary   Diabetes management complicated by dietary restrictions, leading to  higher carbohydrate intake. A1c expected to be elevated, but aggressive management is not prioritized due to overall health status. - Check A1c to assess current diabetes control      Relevant Orders   Hemoglobin A1c   Urine Microalbumin w/creat. ratio     Genitourinary   Stage 3a chronic kidney disease (HCC)   Chronic kidney disease with fluctuating GFR levels, previously dropping to the 30s but recently improved to the 50s. Advised to maintain adequate hydration, especially considering fluid loss through the drainage bag. - Encourage fluid intake to maintain hydration           Stage IV Breast Cancer Ongoing management under Dr. Eyer Robert. Recent labs, including kidney and liver function, blood counts, and cancer markers, were reviewed. On Ibrance, with fever as a possible side effect (12-13%). Experiences periodic fever and chills, potentially linked to stress (per patient, I do not think there is a physiologic explaination for this linkage), but no infection identified. No current signs of infection. - Continue current cancer management with Dr. Fortune Robert - Monitor for recurrent fever episodes and discuss with Dr. 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 Experiences variable drainage patterns. Attempts to cap the tube result in clogging, indicating necessity for proper drainage. - Continue with current drainage tube management, changing every six to eight weeks - Monitor drainage  patterns and report significant changes  General Health Maintenance Routine health maintenance monitored, including cholesterol levels, which are not a primary focus due to liver function and overall health status. - Monitor cholesterol levels as part of routine health maintenance  Goals of Care Discussion about balancing aggressive treatment and quality of life, considering stage IV breast cancer. Focus on comfort and meeting goals without causing additional harm.  - Continue to align treatment plans with her goals of care  Follow-up Plan to follow up on lab results and adjust treatment as necessary based on findings. A six-month physical is planned to reassess overall health status. - Schedule a six-month physical examination - Review lab results and adjust treatment as necessary - Send MyChart message with lab results        Return in about 6 months (around 02/20/2024) for CPE.       Shirlee Latch, MD  [facility] [phone] (phone) [phone] (fax)  [facility]

## 2023-08-20 NOTE — Assessment & Plan Note (Signed)
 Chronic kidney disease with fluctuating GFR levels, previously dropping to the 30s but recently improved to the 50s. Advised to maintain adequate hydration, especially considering fluid loss through the drainage bag. - Encourage fluid intake to maintain hydration

## 2023-08-20 NOTE — Assessment & Plan Note (Signed)
 Managed with Synthroid 88 mcg. Labs will be checked to ensure appropriate dosing. - Check thyroid function tests to assess Synthroid dosing

## 2023-08-20 NOTE — Assessment & Plan Note (Signed)
 Diabetes management complicated by dietary restrictions, leading to higher carbohydrate intake. A1c expected to be elevated, but aggressive management is not prioritized due to overall health status. - Check A1c to assess current diabetes control

## 2023-08-20 NOTE — Assessment & Plan Note (Signed)
Ok to continue to hold HCTZ Continue to monitor BPs and resume if sBP >140 Well controlled Reviewed recent metabolic panel

## 2023-08-20 NOTE — Assessment & Plan Note (Signed)
Reviewed last lipid panel Not currently on a statin - hesitant to take given other medication SEs Recheck FLP and CMP Discussed diet and exercise

## 2023-08-21 ENCOUNTER — Other Ambulatory Visit: Payer: Self-pay

## 2023-08-21 ENCOUNTER — Encounter: Payer: Self-pay | Admitting: Family Medicine

## 2023-08-21 LAB — LIPID PANEL
Chol/HDL Ratio: 3.4 ratio (ref 0.0–4.4)
Cholesterol, Total: 155 mg/dL (ref 100–199)
HDL: 45 mg/dL (ref >39)
LDL Chol Calc (NIH): 83 mg/dL (ref 0–99)
Triglycerides: 153 mg/dL — ABNORMAL HIGH (ref 0–149)
VLDL Cholesterol Cal: 27 mg/dL (ref 5–40)

## 2023-08-21 LAB — HEMOGLOBIN A1C
Est. average glucose Bld gHb Est-mCnc: 123 mg/dL
Hgb A1c MFr Bld: 5.9 % — ABNORMAL HIGH (ref 4.8–5.6)

## 2023-08-21 LAB — MICROALBUMIN / CREATININE URINE RATIO
Creatinine, Urine: 33.6 mg/dL
Microalb/Creat Ratio: 16 mg/g{creat} (ref 0–29)
Microalbumin, Urine: 5.5 ug/mL

## 2023-08-21 LAB — TSH: TSH: 2.94 u[IU]/mL (ref 0.450–4.500)

## 2023-08-24 ENCOUNTER — Other Ambulatory Visit: Payer: Self-pay

## 2023-08-24 ENCOUNTER — Other Ambulatory Visit: Payer: Self-pay | Admitting: Oncology

## 2023-08-24 DIAGNOSIS — C50912 Malignant neoplasm of unspecified site of left female breast: Secondary | ICD-10-CM

## 2023-08-24 MED ORDER — PALBOCICLIB 75 MG PO TABS
75.0000 mg | ORAL_TABLET | Freq: Every day | ORAL | 0 refills | Status: AC
  Filled 2023-08-27: qty 21, 28d supply, fill #0

## 2023-08-24 NOTE — Progress Notes (Signed)
 Specialty Pharmacy Refill Coordination Note  Diamond Hinton is a 68 y.o. female contacted today regarding refills of specialty medication(s) Palbociclib Ilda Foil)   Patient requested Delivery   Delivery date: 08/29/23   Verified address: [address] [place]   Medication will be filled on 08/28/23, pending refill approval.   This fill date is pending response to refill request from provider. Patient is aware and if they have not received fill by intended date they must follow up with pharmacy.

## 2023-08-27 ENCOUNTER — Other Ambulatory Visit: Payer: Self-pay

## 2023-08-27 ENCOUNTER — Other Ambulatory Visit (HOSPITAL_COMMUNITY): Payer: Self-pay

## 2023-09-12 ENCOUNTER — Other Ambulatory Visit: Payer: Self-pay | Admitting: Family Medicine

## 2023-09-12 DIAGNOSIS — E039 Hypothyroidism, unspecified: Secondary | ICD-10-CM

## 2023-09-13 NOTE — Telephone Encounter (Signed)
 Requested Prescriptions  Pending Prescriptions Disp Refills   levothyroxine (SYNTHROID) 88 MCG tablet [Pharmacy Med Name: Levothyroxine Sodium 88 MCG Oral Tablet] 90 tablet 3    Sig: Take 1 tablet by mouth once daily     Endocrinology:  Hypothyroid Agents Failed - 09/13/2023  2:51 PM      Failed - Valid encounter within last 12 months    Recent Outpatient Visits           3 weeks ago Type 2 diabetes mellitus with stage 3a chronic kidney disease, without long-term current use of insulin (HCC)   [facility] Bacigalupo, Marzella Schlein, MD       Future Appointments             In 5 months Bacigalupo, Marzella Schlein, MD [facility]            Passed - TSH in normal range and within 360 days    TSH  Date Value Ref Range Status  08/20/2023 2.940 0.450 - 4.500 uIU/mL Final

## 2023-09-20 ENCOUNTER — Other Ambulatory Visit: Payer: Self-pay

## 2023-09-21 ENCOUNTER — Other Ambulatory Visit: Payer: Self-pay | Admitting: Family Medicine

## 2023-09-25 ENCOUNTER — Other Ambulatory Visit: Payer: Self-pay

## 2023-09-25 ENCOUNTER — Other Ambulatory Visit: Payer: Self-pay | Admitting: Oncology

## 2023-09-25 DIAGNOSIS — C50912 Malignant neoplasm of unspecified site of left female breast: Secondary | ICD-10-CM

## 2023-09-25 NOTE — Progress Notes (Signed)
 Specialty Pharmacy Refill Coordination Note  Diamond Hinton is a 68 y.o. female contacted today regarding refills of specialty medication(s) Palbociclib [name])   Patient requested Delivery   Delivery date: 09/28/23   Verified address: [address] [place]   Medication will be filled on 04.17.25.

## 2023-09-26 ENCOUNTER — Other Ambulatory Visit (HOSPITAL_COMMUNITY): Payer: Self-pay

## 2023-09-26 ENCOUNTER — Other Ambulatory Visit: Payer: Self-pay

## 2023-09-26 ENCOUNTER — Other Ambulatory Visit: Payer: Self-pay | Admitting: Nurse Practitioner

## 2023-09-26 DIAGNOSIS — C50912 Malignant neoplasm of unspecified site of left female breast: Secondary | ICD-10-CM

## 2023-09-26 MED ORDER — PALBOCICLIB 75 MG PO TABS
75.0000 mg | ORAL_TABLET | Freq: Every day | ORAL | 0 refills | Status: DC
  Filled 2023-09-26: qty 21, 28d supply, fill #0

## 2023-09-27 ENCOUNTER — Other Ambulatory Visit: Payer: Self-pay

## 2023-10-08 ENCOUNTER — Emergency Department

## 2023-10-08 ENCOUNTER — Emergency Department
Admission: EM | Admit: 2023-10-08 | Discharge: 2023-10-08 | Disposition: A | Discharge status: Home / Self Care | Attending: Emergency Medicine | Admitting: Emergency Medicine

## 2023-10-08 ENCOUNTER — Other Ambulatory Visit: Payer: Self-pay

## 2023-10-08 DIAGNOSIS — R1111 Vomiting without nausea: Secondary | ICD-10-CM | POA: Diagnosis not present

## 2023-10-08 DIAGNOSIS — C7981 Secondary malignant neoplasm of breast: Secondary | ICD-10-CM | POA: Diagnosis not present

## 2023-10-08 DIAGNOSIS — R791 Abnormal coagulation profile: Secondary | ICD-10-CM | POA: Insufficient documentation

## 2023-10-08 DIAGNOSIS — R42 Dizziness and giddiness: Secondary | ICD-10-CM | POA: Diagnosis not present

## 2023-10-08 DIAGNOSIS — C799 Secondary malignant neoplasm of unspecified site: Secondary | ICD-10-CM

## 2023-10-08 LAB — CBC WITH DIFFERENTIAL/PLATELET
Abs Immature Granulocytes: 0.04 10*3/uL (ref 0.00–0.07)
Basophils Absolute: 0.1 10*3/uL (ref 0.0–0.1)
Basophils Relative: 1 %
Eosinophils Absolute: 0 10*3/uL (ref 0.0–0.5)
Eosinophils Relative: 0 %
HCT: 34.2 % — ABNORMAL LOW (ref 36.0–46.0)
Hemoglobin: 11.7 g/dL — ABNORMAL LOW (ref 12.0–15.0)
Immature Granulocytes: 0 %
Lymphocytes Relative: 11 %
Lymphs Abs: 1 10*3/uL (ref 0.7–4.0)
MCH: 31.3 pg (ref 26.0–34.0)
MCHC: 34.2 g/dL (ref 30.0–36.0)
MCV: 91.4 fL (ref 80.0–100.0)
Monocytes Absolute: 0.3 10*3/uL (ref 0.1–1.0)
Monocytes Relative: 3 %
Neutro Abs: 7.8 10*3/uL — ABNORMAL HIGH (ref 1.7–7.7)
Neutrophils Relative %: 85 %
Platelets: 196 10*3/uL (ref 150–400)
RBC: 3.74 MIL/uL — ABNORMAL LOW (ref 3.87–5.11)
RDW: 14.2 % (ref 11.5–15.5)
WBC: 9.2 10*3/uL (ref 4.0–10.5)
nRBC: 0 % (ref 0.0–0.2)

## 2023-10-08 LAB — BASIC METABOLIC PANEL WITH GFR
Anion gap: 14 (ref 5–15)
BUN: 20 mg/dL (ref 8–23)
CO2: 14 mmol/L — ABNORMAL LOW (ref 22–32)
Calcium: 8.8 mg/dL — ABNORMAL LOW (ref 8.9–10.3)
Chloride: 107 mmol/L (ref 98–111)
Creatinine, Ser: 1.09 mg/dL — ABNORMAL HIGH (ref 0.44–1.00)
GFR, Estimated: 55 mL/min — ABNORMAL LOW (ref >60)
Glucose, Bld: 171 mg/dL — ABNORMAL HIGH (ref 70–99)
Potassium: 3.6 mmol/L (ref 3.5–5.1)
Sodium: 135 mmol/L (ref 135–145)

## 2023-10-08 LAB — CBC
HCT: 35.6 % — ABNORMAL LOW (ref 36.0–46.0)
Hemoglobin: 12 g/dL (ref 12.0–15.0)
MCH: 31.4 pg (ref 26.0–34.0)
MCHC: 33.7 g/dL (ref 30.0–36.0)
MCV: 93.2 fL (ref 80.0–100.0)
Platelets: 221 10*3/uL (ref 150–400)
RBC: 3.82 MIL/uL — ABNORMAL LOW (ref 3.87–5.11)
RDW: 14.2 % (ref 11.5–15.5)
WBC: 8.3 10*3/uL (ref 4.0–10.5)
nRBC: 0 % (ref 0.0–0.2)

## 2023-10-08 LAB — HEPATIC FUNCTION PANEL
ALT: 21 U/L (ref 0–44)
AST: 23 U/L (ref 15–41)
Albumin: 3.6 g/dL (ref 3.5–5.0)
Alkaline Phosphatase: 55 U/L (ref 38–126)
Bilirubin, Direct: <0.1 mg/dL (ref 0.0–0.2)
Total Bilirubin: 0.4 mg/dL (ref 0.0–1.2)
Total Protein: 6.6 g/dL (ref 6.5–8.1)

## 2023-10-08 LAB — PROTIME-INR
INR: 1.4 — ABNORMAL HIGH (ref 0.8–1.2)
Prothrombin Time: 16.9 s — ABNORMAL HIGH (ref 11.4–15.2)

## 2023-10-08 LAB — TROPONIN I (HIGH SENSITIVITY)
Troponin I (High Sensitivity): 3 ng/L (ref <18)
Troponin I (High Sensitivity): 6 ng/L (ref <18)

## 2023-10-08 LAB — ETHANOL: Alcohol, Ethyl (B): <15 mg/dL (ref <15)

## 2023-10-08 LAB — APTT: aPTT: 30 s (ref 24–36)

## 2023-10-08 LAB — BLOOD GAS, VENOUS

## 2023-10-08 MED ORDER — GADOBUTROL 1 MMOL/ML IV SOLN
6.0000 mL | Freq: Once | INTRAVENOUS | Status: AC | PRN
  Administered 2023-10-08: 6 mL via INTRAVENOUS

## 2023-10-08 MED ORDER — MECLIZINE HCL 25 MG PO TABS
25.0000 mg | ORAL_TABLET | Freq: Once | ORAL | Status: AC
  Administered 2023-10-08: 25 mg via ORAL
  Filled 2023-10-08: qty 1

## 2023-10-08 MED ORDER — SODIUM CHLORIDE 0.9 % IV BOLUS
1000.0000 mL | Freq: Once | INTRAVENOUS | Status: AC
  Administered 2023-10-08: 1000 mL via INTRAVENOUS

## 2023-10-08 MED ORDER — MECLIZINE HCL 25 MG PO TABS: 25.0000 mg | ORAL_TABLET | Freq: Two times a day (BID) | ORAL | 0 refills | Status: AC | PRN

## 2023-10-08 NOTE — Consult Note (Signed)
 [facility] Consult Services   Patient Name:   Diamond Hinton, Diamond Hinton Date of Birth:   1956-02-10 Identification Number:   MRN - [id number] Date of Service:   10/08/2023 20:05:39  Diagnosis:       R42 - Dizziness/ Vertigo/ Giddiness  Impression:      The patient's vertigo is likely peripheral related to her left ear pathology. She has no central features and has had this before multiple times. Given her history of stage 4 cancer, a central lesion could be excluded and an MRI is reasonable.  Recommend MRI brain and IAC with and without contrast.  Give aspirin 81 mg daily until a stroke is fully ruled out by MRI.  Recommend ENT evaluation.  Meclizine PRN.  Rest per primary.  Recommend inpatient Neurology follow up to follow on the results of her MRI scans.  Our recommendations are outlined below.  Recommendations:        Stroke/Telemetry Floor       Neuro Checks       Bedside Swallow Eval       DVT Prophylaxis       IV Fluids, Normal Saline       Head of Bed 30 Degrees       Euglycemia and Avoid Hyperthermia (PRN Acetaminophen )       Initiate or continue Aspirin 81 MG daily  Sign Out:       Discussed with Emergency Department Provider    ------------------------------------------------------------------------------  Advanced Imaging: Advanced Imaging Deferred because:  Non-disabling symptoms as verified by the patient; no cortical signs so not consistent with LVO   Metrics: Last Known Well: 10/08/2023 16:30:00 Dispatch Time: 10/08/2023 20:05:39 Arrival Time: 10/08/2023 18:15:00 Initial Response Time: 10/08/2023 20:07:40 Symptoms: Dizziness. Initial patient interaction: 10/08/2023 20:13:12 NIHSS Assessment Completed: 10/08/2023 20:18:37 Patient is not a candidate for Thrombolytic. Thrombolytic Medical Decision: 10/08/2023 20:18:37 Patient was not deemed candidate for Thrombolytic because of following reasons: Stroke severity too mild  (non-disabling) .  CT head showed no acute hemorrhage or acute core infarct.  Primary Provider Notified of Diagnostic Impression and Management Plan on: 10/08/2023 20:53:35    ------------------------------------------------------------------------------  History of Present Illness: Patient is a 68 year old Female.  Patient was brought by EMS for symptoms of Dizziness. 68 yo female with a history of stage 4 cancer, vertigo and left ear issues who comes in with dizziness that started suddenly at 4:30pm. It started as a "woozy" feeling, then became vertigo with a room spinning sensation. This is similar to prior vertigo episodes she has had. She feels her left ear is full with decreased hearing. She also had nausea and vomiting. Her symptoms have improved but not resolved. Otherwise she has no focal deficits or complaints. She is able to walk.    Past Medical History:      Hypertension      Diabetes Mellitus      Hyperlipidemia Other PMH:  CKD  Medications:  No Anticoagulant use  No Antiplatelet use Reviewed EMR for current medications  Allergies:  Reviewed  Social History: Smoking: No  Family History:  There is no family history of premature cerebrovascular disease pertinent to this consultation  ROS : 14 Points Review of Systems was performed and was negative except mentioned in HPI.  Past Surgical History: There Is No Surgical History Contributory To Today's Visit     Examination: BP(137/83), Pulse(66), Blood Glucose(171) 1A: Level of Consciousness - Alert; keenly responsive + 0 1B: Ask Month and Age - Both Questions  Right + 0 1C: Blink Eyes & Squeeze Hands - Performs Both Tasks + 0 2: Test Horizontal Extraocular Movements - Normal + 0 3: Test Visual Fields - No Visual Loss + 0 4: Test Facial Palsy (Use Grimace if Obtunded) - Normal symmetry + 0 5A: Test Left Arm Motor Drift - No Drift for 10 Seconds + 0 5B: Test Right Arm Motor Drift - No Drift for 10 Seconds  + 0 6A: Test Left Leg Motor Drift - No Drift for 5 Seconds + 0 6B: Test Right Leg Motor Drift - No Drift for 5 Seconds + 0 7: Test Limb Ataxia (FNF/Heel-Shin) - No Ataxia + 0 8: Test Sensation - Normal; No sensory loss + 0 9: Test Language/Aphasia - Normal; No aphasia + 0 10: Test Dysarthria - Normal + 0 11: Test Extinction/Inattention - No abnormality + 0  NIHSS Score: 0   Pre-Morbid Modified Rankin Scale: 0 Points = No symptoms at all  Spoke with : Dr Peggi Bowels  This consult was conducted in real time using interactive audio and Immunologist. Patient was informed of the technology being used for this visit and agreed to proceed. Patient located in hospital and provider located at home/office setting.   Patient is being evaluated for possible acute neurologic impairment and high probability of imminent or life-threatening deterioration. I spent total of 35 minutes providing care to this patient, including time for face to face visit via telemedicine, review of medical records, imaging studies and discussion of findings with providers, the patient and/or family.   Dr Leeroy Pulley   [facility] For Inpatient follow-up with [facility] physician please call [facility] at [phone]. As we are not an outpatient service for any post hospital discharge needs please contact the hospital for assistance. If you have any questions for the [facility] physicians or need to reconsult for clinical or diagnostic changes please contact us  via [facility] at [phone].

## 2023-10-08 NOTE — Discharge Instructions (Addendum)
 Return to the ER for worsening symptoms or any other concerns otherwise we will prescribe some meclizine to try to take to help with your dizziness.

## 2023-10-08 NOTE — ED Triage Notes (Signed)
 First nurse note: Dizziness and emesis x1 hr. Hx of vertigo. Stage 4 breast cancer. 500 LR   EMS  146/76 100% RA 70 HR  4mg  zofran   18g LAC

## 2023-10-08 NOTE — ED Provider Notes (Addendum)
 [facility] Provider Note    Event Date/Time   First MD Initiated Contact with Patient 10/08/23 1946     (approximate)   History   Dizziness   HPI  Diamond Hinton is a 68 y.o. female with a history of stage IV breast cancer who comes in with dizziness and emesis that started about 1 hour prior to arrival.  Patient does take oral chemotherapy.  She was given 500 of fluids, 4 of Zofran .  She does have biliary drain.  Patient on review of records from 07/17/2023 with oncology.  Had obstructive jaundice and has a biliary drain in place.  She states that she has plans to have her drain changed next week.  She reports having a remote history of vertigo but not having it for years.  She reports that she felt her normal self today was driving someone to an event.  She states that around 430 - 5pm she had sudden onset of severe dizziness, vomiting.  She states that her symptoms were present even with staying completely still.  She does report that symptoms are getting better but still feels dizzy.   Physical Exam   Triage Vital Signs: ED Triage Vitals  Encounter Vitals Group     BP 10/08/23 1827 133/69     Systolic BP Percentile --      Diastolic BP Percentile --      Pulse Rate 10/08/23 1825 61     Resp 10/08/23 1825 20     Temp 10/08/23 1826 (!) 96.4 F (35.8 C)     Temp Source 10/08/23 1826 Axillary     SpO2 10/08/23 1825 99 %     Weight --      Height --      Head Circumference --      Peak Flow --      Pain Score 10/08/23 1825 0     Pain Loc --      Pain Education --      Exclude from Growth Chart --     Most recent vital signs: Vitals:   10/08/23 1826 10/08/23 1827  BP:  133/69  Pulse:    Resp:    Temp: (!) 96.4 F (35.8 C)   SpO2:       General: Awake, no distress.  CV:  Good peripheral perfusion.  Resp:  Normal effort.  Abd:  No distention. Soft and non tender- drain in place with drainage out of it.  Other:  TMs are clear  bilaterally.  Fingers nose may be slightly ataxic on the right.  Patient is able to stand up and ambulate   ED Results / Procedures / Treatments   Labs (all labs ordered are listed, but only abnormal results are displayed) Labs Reviewed  BASIC METABOLIC PANEL WITH GFR - Abnormal; Notable for the following components:      Result Value   CO2 14 (*)    Glucose, Bld 171 (*)    Creatinine, Ser 1.09 (*)    Calcium  8.8 (*)    GFR, Estimated 55 (*)    All other components within normal limits  CBC - Abnormal; Notable for the following components:   RBC 3.82 (*)    HCT 35.6 (*)    All other components within normal limits  URINALYSIS, ROUTINE W REFLEX MICROSCOPIC  CBG MONITORING, ED     EKG  My interpretation of EKG:  Normal sinus rate of 63 without any ST elevation or T wave  versions, normal intervals  RADIOLOGY I have reviewed the ct personally and interpreted no evidence of intracranial hemorrhage   PROCEDURES:  Critical Care performed: Yes, see critical care procedure note(s)  .1-3 Lead EKG Interpretation  Performed by: Lubertha Rush, MD Authorized by: Lubertha Rush, MD     Interpretation: normal     ECG rate:  70   ECG rate assessment: normal     Rhythm: sinus rhythm     Ectopy: none     Conduction: normal   .Critical Care  Performed by: Lubertha Rush, MD Authorized by: Lubertha Rush, MD   Critical care provider statement:    Critical care time (minutes):  30   Critical care was necessary to treat or prevent imminent or life-threatening deterioration of the following conditions:  CNS failure or compromise   Critical care was time spent personally by me on the following activities:  Development of treatment plan with patient or surrogate, discussions with consultants, evaluation of patient's response to treatment, examination of patient, ordering and review of laboratory studies, ordering and review of radiographic studies, ordering and performing treatments and  interventions, pulse oximetry, re-evaluation of patient's condition and review of old charts    MEDICATIONS ORDERED IN ED: Medications  sodium chloride  0.9 % bolus 1,000 mL (0 mLs Intravenous Stopped 10/08/23 2134)  meclizine (ANTIVERT) tablet 25 mg (25 mg Oral Given 10/08/23 2209)  gadobutrol (GADAVIST) 1 MMOL/ML injection 6 mL (6 mLs Intravenous Contrast Given 10/08/23 2131)     IMPRESSION / MDM / ASSESSMENT AND PLAN / ED COURSE  I reviewed the triage vital signs and the nursing notes.   Patient's presentation is most consistent with acute presentation with potential threat to life or bodily function.  Patient comes in with concerns for dizziness, nausea and vomiting she did report some difficulties with ambulating.  Symptoms still persistent although improved.  Patient within the window for stroke code therefore stroke code was called.  Consider ACS, electrolyte abnormalities, brain met.  TMs are clear.  Could be vertigo.   Cbc nromal Bmp low bicarb could be secondary to the vomiting.  Her VBG does not show any signs of acidosis. Her hepatic function is normal Cardiac markers are negative x 2  Discussed with teleneurology who thought this was peripheral in nature did recommend MRI given her cancer history however with IAC included.  If MRI is negative suspect patient can be discharged home with a short course of meclizine and follow-up with ENT  Reevaluated patient updated on results.  She states that she feels comfortable going home if MRI is negative.     IMPRESSION: Normal MRI of the brain and internal auditory canals. No acute intracranial abnormality.   Pt feeling much improved, she can walk and wants to go home.   I discussed with patient admission versus going home patient does not want to be admitted to the hospital.  She reports that she is she has to get home to her husband with Parkinson's.  Given the MRI is negative I did message the neurologist so that they are  aware where that patient was not going to be spending the night in the hospital.  In his note he had talked about admitting the patient but patient states that given her MRI is negative she does not want to stay and I would suspect that the neurologist did not realize that we get MRIs so fast here and that given she has had vertigo before he had  very low suspicion for stroke patient would prefer discharge home.  I did attempt to send the neurologist a message as well as call the neurologist to let him know that patient did not want to stay.  The patient is on the cardiac monitor to evaluate for evidence of arrhythmia and/or significant heart rate changes.      FINAL CLINICAL IMPRESSION(S) / ED DIAGNOSES   Final diagnoses:  Vertigo  Metastatic malignant neoplasm, unspecified site [facility])     Rx / DC Orders   ED Discharge Orders          Ordered    meclizine (ANTIVERT) 25 MG tablet  2 times daily PRN        10/08/23 2235             Note:  This document was prepared using Dragon voice recognition software and may include unintentional dictation errors.   Lubertha Rush, MD 10/08/23 2236    Lubertha Rush, MD 10/08/23 2315    Lubertha Rush, MD 10/08/23 2320    Lubertha Rush, MD 10/08/23 [phone]

## 2023-10-08 NOTE — Progress Notes (Addendum)
 2000 - Code Stroke Activated   LKWT 1630, mRS 0. Patient presented by EMS with dizziness, N&V, symptoms have improved but not resolved  2005 - Patient to CT   2005 - Tele-Neurologist paged  2007 - Dr. Madison Schimke joined stroke cart   2017 - NCCT results given on camera to Dr. Madison Schimke   2024 -  Patient leaving CT

## 2023-10-08 NOTE — ED Triage Notes (Signed)
 Pt to ED via [facility] from home. Pt reports dizziness and emesis that started 1hr PTA. Pt with stage 4 cancer. Pt take oral chemo. Pt given 500 LR and 4mg  zofran  in route. Pt has bilary drain on right side. Pt with hx of vertigo.

## 2023-10-08 NOTE — ED Notes (Signed)
 Code Stroke called spoke with [facility]

## 2023-10-09 ENCOUNTER — Other Ambulatory Visit (HOSPITAL_COMMUNITY): Payer: Self-pay | Admitting: Radiology

## 2023-10-09 DIAGNOSIS — K831 Obstruction of bile duct: Secondary | ICD-10-CM

## 2023-10-09 NOTE — Progress Notes (Signed)
 Patient for IR Biliary Drain Exchange on Wed 10/10/23, I called and spoke with the patient on the phone and gave pre-procedure instructions. Pt was made aware to be here at 7:30a, NPO after MN prior to procedure as well as driver post procedure/recovery/discharge. Pt stated understanding.  Called 10/02/2023  Dr Jinx Mourning and Dr Marne Sings have discussed this patient and what Dr Jinx Mourning does as far as his stitch, etc. (0-silk stitch and interrupt it inferior to the drain)

## 2023-10-10 ENCOUNTER — Encounter: Payer: Self-pay | Admitting: Radiology

## 2023-10-10 ENCOUNTER — Ambulatory Visit
Admission: RE | Admit: 2023-10-10 | Discharge: 2023-10-10 | Disposition: A | Discharge status: Home / Self Care | Source: Ambulatory Visit | Attending: Interventional Radiology | Admitting: Interventional Radiology

## 2023-10-10 ENCOUNTER — Other Ambulatory Visit: Payer: Self-pay

## 2023-10-10 ENCOUNTER — Other Ambulatory Visit: Payer: Self-pay | Admitting: Interventional Radiology

## 2023-10-10 DIAGNOSIS — E119 Type 2 diabetes mellitus without complications: Secondary | ICD-10-CM | POA: Insufficient documentation

## 2023-10-10 DIAGNOSIS — Z434 Encounter for attention to other artificial openings of digestive tract: Secondary | ICD-10-CM | POA: Insufficient documentation

## 2023-10-10 DIAGNOSIS — I10 Essential (primary) hypertension: Secondary | ICD-10-CM | POA: Diagnosis not present

## 2023-10-10 DIAGNOSIS — Z853 Personal history of malignant neoplasm of breast: Secondary | ICD-10-CM | POA: Insufficient documentation

## 2023-10-10 DIAGNOSIS — K831 Obstruction of bile duct: Secondary | ICD-10-CM | POA: Insufficient documentation

## 2023-10-10 HISTORY — PX: IR EXCHANGE BILIARY DRAIN: IMG6046

## 2023-10-10 LAB — BLOOD GAS, VENOUS
Bicarbonate: 22.7 mmol/L — ABNORMAL HIGH (ref 20.0–28.0)
O2 Saturation: 46.8 mmol/L — ABNORMAL HIGH (ref 0.0–2.0)
Patient temperature: 37
Patient temperature: 46.8 %
pCO2, Ven: 45 mmHg (ref 44–60)
pH, Ven: 7.31 (ref 7.25–7.43)
pO2, Ven: 22.7 mmol/L (ref 32–45)

## 2023-10-10 MED ORDER — IOHEXOL 300 MG/ML  SOLN
4.0000 mL | Freq: Once | INTRAMUSCULAR | Status: AC | PRN
  Administered 2023-10-10: 4 mL

## 2023-10-10 MED ORDER — SODIUM CHLORIDE 0.9 % IV SOLN: INTRAVENOUS | Status: DC

## 2023-10-10 MED ORDER — SODIUM CHLORIDE 0.9% FLUSH: 5.0000 mL | Freq: Three times a day (TID) | INTRAVENOUS | Status: DC

## 2023-10-10 MED ORDER — LIDOCAINE HCL 1 % IJ SOLN: 1.0000 mL | Freq: Once | INTRAMUSCULAR | Status: DC

## 2023-10-10 MED ORDER — SODIUM CHLORIDE 0.9 % IV SOLN
1.0000 g | Freq: Once | INTRAVENOUS | Status: AC
  Administered 2023-10-10: 1 g via INTRAVENOUS
  Filled 2023-10-10: qty 10

## 2023-10-10 MED ORDER — SODIUM CHLORIDE 0.9% FLUSH: 3.0000 mL | Freq: Two times a day (BID) | INTRAVENOUS | Status: DC

## 2023-10-10 MED ORDER — FENTANYL CITRATE (PF) 100 MCG/2ML IJ SOLN
INTRAMUSCULAR | Status: AC
  Filled 2023-10-10: qty 2

## 2023-10-10 MED ORDER — FENTANYL CITRATE (PF) 100 MCG/2ML IJ SOLN
INTRAMUSCULAR | Status: AC | PRN
Start: 2023-10-10 — End: 2023-10-10
  Administered 2023-10-10: 50 ug via INTRAVENOUS

## 2023-10-10 MED ORDER — SODIUM CHLORIDE 0.9% FLUSH: 3.0000 mL | INTRAVENOUS | Status: DC | PRN

## 2023-10-10 MED ORDER — MIDAZOLAM HCL 2 MG/2ML IJ SOLN
INTRAMUSCULAR | Status: AC | PRN
Start: 2023-10-10 — End: 2023-10-10
  Administered 2023-10-10: 1 mg via INTRAVENOUS

## 2023-10-10 MED ORDER — MIDAZOLAM HCL 2 MG/2ML IJ SOLN
INTRAMUSCULAR | Status: AC
  Filled 2023-10-10: qty 2

## 2023-10-10 MED ORDER — LIDOCAINE HCL 1 % IJ SOLN
INTRAMUSCULAR | Status: AC
  Filled 2023-10-10: qty 20

## 2023-10-10 NOTE — Procedures (Signed)
 Interventional Radiology Procedure Note  Procedure: Successful routine exchane of 4F int/ext biliary drain.  Complications: None  Estimated Blood Loss: None  Recommendations: - Return to IR in 8 weeks   Signed,  Roxie Cord, MD

## 2023-10-10 NOTE — H&P (Signed)
 Chief Complaint: Patient was seen in consultation today for biliary obstruction   Procedure: Routine biliary drain exchange  Referring Physician(s): Dr. Randy Buttery  Supervising Physician: Fernando Hoyer  Patient Status: [facility] - Out-pt  History of Present Illness: Diamond Hinton is a 68 y.o. 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 She is well-known to IR from initial biliary drain placement 08/31/21. A covered stent was placed into the common bile duct 02/08/22 but unfortunately she developed recurrent biliary obstruction. IR placed an additional 12 Fr right-sided internal/external biliary drain 07/17/22 with balloon angioplasty and drain upsize to a 14 Fr on 08/22/22.   She has undergone routine drain exchanges with moderate sedation since 01/2023. She presents today for her 6 week routine drain exchange.  Patient is currently without complaint, no new concerns. Reports npo since midnight with only small sip of water  with medication.  Code Status: Full Code  Past Medical History:  Diagnosis Date   Anemia    Anxiety    Breast cancer (HCC)    Gallstones    GERD (gastroesophageal reflux disease)    Hyperlipidemia    Hypertension    Jaundice 08/29/2021   Rash 08/29/2021   Right upper quadrant abdominal pain 08/29/2021   UTI (urinary tract infection) 08/29/2021    Past Surgical History:  Procedure Laterality Date   BILATERAL SALPINGECTOMY  04/03/1997   CHOLECYSTECTOMY N/A 07/07/2015   Procedure: LAPAROSCOPIC CHOLECYSTECTOMY ;  Surgeon: Alben Alma, MD;  Location: [facility] ORS;  Service: General;  Laterality: N/A;   ESOPHAGOGASTRODUODENOSCOPY  08/30/2021   Procedure: ESOPHAGOGASTRODUODENOSCOPY (EGD);  Surgeon: Marnee Sink, MD;  Location: [facility] ENDOSCOPY;  Service: Endoscopy;;   GALLBLADDER SURGERY  07/07/2015   [facility]   IR BILIARY DILITATION  08/22/2022   IR  BILIARY DRAIN PLACEMENT WITH CHOLANGIOGRAM  08/31/2021   IR BILIARY STENT(S) NEW ACCESS WITH DRAIN  02/08/2022   IR CHOLANGIOGRAM EXISTING TUBE  08/17/2022   IR CONVERT BILIARY DRAIN TO INT EXT BILIARY DRAIN  09/02/2021   IR EXCHANGE BILIARY DRAIN  10/10/2021   IR EXCHANGE BILIARY DRAIN  12/05/2021   IR EXCHANGE BILIARY DRAIN  01/19/2022   IR EXCHANGE BILIARY DRAIN  10/13/2022   IR EXCHANGE BILIARY DRAIN  11/24/2022   IR EXCHANGE BILIARY DRAIN  01/18/2023   IR EXCHANGE BILIARY DRAIN  03/13/2023   IR EXCHANGE BILIARY DRAIN  04/24/2023   IR EXCHANGE BILIARY DRAIN  06/12/2023   IR EXCHANGE BILIARY DRAIN  08/09/2023   IR INT EXT BILIARY DRAIN WITH CHOLANGIOGRAM  07/17/2022   IR RADIOLOGIST EVAL & MGMT  03/01/2022   IR RADIOLOGIST EVAL & MGMT  08/17/2022    Allergies: Peanuts [peanut oil], Levaquin  [levofloxacin ], and Flexeril  [cyclobenzaprine ]  Medications: Prior to Admission medications   Medication Sig Start Date End Date Taking? Authorizing Provider  acetaminophen  (TYLENOL ) 325 MG tablet Take 325 mg by mouth every 6 (six) hours as needed (for pain and sometimes takes 2 if pain is worse).   Yes [provider]  baclofen  (LIORESAL ) 10 MG tablet Take 10 mg by mouth 3 (three) times daily as needed.   Yes [provider]  letrozole  (FEMARA ) 2.5 MG tablet Take 1 tablet by mouth once daily 07/18/23  Yes Rao, Archana C, MD  levothyroxine  (SYNTHROID ) 88 MCG tablet Take 1 tablet by mouth once daily 09/13/23  Yes Bacigalupo, Angela M, MD  meclizine (ANTIVERT) 25 MG tablet  Take 1 tablet (25 mg total) by mouth 2 (two) times daily as needed for up to 10 days for dizziness. 10/08/23 10/18/23 Yes Lubertha Rush, MD  Multiple Vitamins-Minerals (CENTRUM VITAMINTS PO) Take 1 tablet by mouth daily.   Yes [provider]  ondansetron  (ZOFRAN ) 4 MG tablet Take 1 tablet (4 mg total) by mouth daily as needed for nausea or vomiting. 01/16/23 01/16/24 Yes Avonne Boettcher, MD  palbociclib  (IBRANCE ) 75 MG tablet Take 1  tablet (75 mg total) by mouth daily. Take for 21 days on, 7 days off, repeat every 28 days. 06/19/23  Yes Nelda Balsam, NP  pantoprazole  (PROTONIX ) 40 MG tablet Take 1 tablet by mouth once daily 09/21/23  Yes Bacigalupo, Angela M, MD  prochlorperazine  (COMPAZINE ) 5 MG tablet Take 1 tablet (5 mg total) by mouth every 6 (six) hours as needed for refractory nausea / vomiting. 02/14/22  Yes Avonne Boettcher, MD  levofloxacin  (LEVAQUIN ) 250 MG tablet Take 2 tablets (500 mg total) by mouth daily. 07/17/23   Avonne Boettcher, MD  oxyCODONE  (ROXICODONE ) 5 MG immediate release tablet Take 1 tablet (5 mg total) by mouth every 4 (four) hours as needed for severe pain. 07/24/22   Bacigalupo, Angela M, MD  palbociclib  (IBRANCE ) 75 MG tablet Take 1 tablet (75 mg total) by mouth daily. Take for 21 days on, 7 days off, repeat every 28 days. 08/24/23   Nelda Balsam, NP  palbociclib  (IBRANCE ) 75 MG tablet Take 1 tablet (75 mg total) by mouth daily. Take for 21 days on, 7 days off, repeat every 28 days. 09/26/23   Avonne Boettcher, MD  potassium chloride  SA (KLOR-CON  M) 20 MEQ tablet Take 1 tablet (20 mEq total) by mouth daily. Patient not taking: Reported on 08/20/2023 01/16/23   Avonne Boettcher, MD  traZODone  (DESYREL ) 50 MG tablet Take 50 mg by mouth at bedtime.    [provider]     Family History  Problem Relation Age of Onset   Hypertension Mother    CVA Mother    Ulcers Mother    Emphysema Mother    Heart disease Father    Prostate cancer Father    Macular degeneration Father    Hypertension Sister    Diabetes Sister    Cervical polyp Sister    Hypertension Sister    Diabetes Sister    Lymphoma Sister     Social History   Socioeconomic History   Marital status: Married    Spouse name: Liana Reding   Number of children: 4   Years of education: Not on file   Highest education level: Not on file  Occupational History   Not on file  Tobacco Use   Smoking status: Never   Smokeless tobacco: Never   Vaping Use   Vaping status: Never Used  Substance and Sexual Activity   Alcohol use: No   Drug use: No   Sexual activity: Yes  Other Topics Concern   Not on file  Social History Narrative   Lives with spouse at home: Liana Reding has Parkinson's. No indoor pets.    Social Drivers of Corporate investment banker Strain: Low Risk  (02/06/2023)   Overall Financial Resource Strain (CARDIA)    Difficulty of Paying Living Expenses: Not hard at all  Food Insecurity: No Food Insecurity (02/06/2023)   Hunger Vital Sign    Worried About Running Out of Food in the Last Year: Never true    Ran Out  of Food in the Last Year: Never true  Transportation Needs: No Transportation Needs (02/06/2023)   PRAPARE - Administrator, Civil Service (Medical): No    Lack of Transportation (Non-Medical): No  Physical Activity: Inactive (02/06/2023)   Exercise Vital Sign    Days of Exercise per Week: 0 days    Minutes of Exercise per Session: 0 min  Stress: No Stress Concern Present (11/24/2021)   [employer] of Occupational Health - Occupational Stress Questionnaire    Feeling of Stress : Only a little  Social Connections: Moderately Isolated (02/06/2023)   Social Connection and Isolation Panel [NHANES]    Frequency of Communication with Friends and Family: More than three times a week    Frequency of Social Gatherings with Friends and Family: Never    Attends Religious Services: Never    Database administrator or Organizations: No    Attends Banker Meetings: Never    Marital Status: Married    Review of Systems Denies any N/V, chest pain, shortness of breath, fevers/chills. All other ROS negative.  Vital Signs: BP 137/74   Pulse 66   Temp 98.1 F (36.7 C) (Oral)   Resp (!) 21   Ht 5\' 3"  (1.6 m)   Wt 133 lb 4.8 oz (60.5 kg)   SpO2 97%   BMI 23.61 kg/m    Physical Exam Vitals and nursing note reviewed.  Constitutional:      Appearance: Normal appearance.  HENT:      Mouth/Throat:     Mouth: Mucous membranes are moist.     Pharynx: Oropharynx is clear.  Cardiovascular:     Rate and Rhythm: Normal rate and regular rhythm.  Pulmonary:     Effort: Pulmonary effort is normal.     Breath sounds: Normal breath sounds.  Abdominal:     Palpations: Abdomen is soft.     Tenderness: There is no abdominal tenderness.     Comments: + RUQ biliary drain noted. No pain, no overlying abnormality. Bilious output noted in bag.   Musculoskeletal:     Right lower leg: No edema.     Left lower leg: No edema.  Skin:    General: Skin is warm and dry.  Neurological:     Mental Status: She is alert and oriented to person, place, and time. Mental status is at baseline.     Imaging: MR BRAIN/IAC W WO CONTRAST Result Date: 10/08/2023 CLINICAL DATA:  Initial evaluation for acute dizziness. EXAM: MRI HEAD WITHOUT AND WITH CONTRAST TECHNIQUE: Multiplanar, multiecho pulse sequences of the brain and surrounding structures were obtained without and with intravenous contrast. An IAC protocol was utilized. CONTRAST:  6mL GADAVIST GADOBUTROL 1 MMOL/ML IV SOLN COMPARISON:  CT from earlier the same day. FINDINGS: Brain: Cerebral volume within normal limits. Few scattered patchy subcentimeter foci of T2/FLAIR hyperintensity noted involving the supratentorial cerebral white matter, nonspecific, but most commonly seen in the setting of chronic microvascular ischemic disease. Changes are less than is typically seen for age. No evidence for acute or subacute infarct. Gray-white matter differentiation maintained. No areas of chronic cortical infarction. No acute or chronic intracranial blood products. No mass lesion within the brain itself. No mass effect or midline shift. No hydrocephalus or extra-axial fluid collection. Pituitary gland within normal limits. Pituitary stalk is mildly prominent and thickened, likely similar to prior, and of doubtful significance. Suprasellar region otherwise  unremarkable. No abnormal enhancement within the brain itself. Thin section imaging through the  internal auditory canals was performed. Seventh and eighth cranial nerves are seen coursing normally through the cerebellopontine angle cisterns into the internal auditory canals. No CPA angle mass. No intracanalicular mass or abnormal enhancement. Inner ear structures including the vestibulae, cochlea, and semi circular canals are normal. Normal post ganglionic enhancement seen within the seventh cranial nerves bilaterally. No significant mastoid effusion. Vascular: Major intracranial vascular flow voids are maintained. Skull and upper cervical spine: Craniocervical junction within normal limits. Bone marrow signal intensity normal. No scalp soft tissue abnormality. Sinuses/Orbits: Globes orbital soft tissues within normal limits. Paranasal sinuses are largely clear. Other: None. IMPRESSION: Normal MRI of the brain and internal auditory canals. No acute intracranial abnormality. Electronically Signed   By: Virgia Griffins M.D.   On: 10/08/2023 22:54   CT HEAD CODE STROKE WO CONTRAST Result Date: 10/08/2023 CLINICAL DATA:  Code stroke. Initial evaluation for acute neuro deficit, stroke suspected. EXAM: CT HEAD WITHOUT CONTRAST TECHNIQUE: Contiguous axial images were obtained from the base of the skull through the vertex without intravenous contrast. RADIATION DOSE REDUCTION: This exam was performed according to the departmental dose-optimization program which includes automated exposure control, adjustment of the mA and/or kV according to patient size and/or use of iterative reconstruction technique. COMPARISON:  CT from 12/25/2022 FINDINGS: Brain: Cerebral volume within normal limits for patient age. No acute intracranial hemorrhage. No acute large vessel territory infarct. No mass lesion, midline shift, or mass effect. Ventricles are normal in size without hydrocephalus. No extra-axial fluid collection.  Vascular: No abnormal hyperdense vessel. Skull: Scalp soft tissues demonstrate no acute abnormality. Calvarium intact. Sinuses/Orbits: Globes and orbital soft tissues within normal limits. Visualized paranasal sinuses are largely clear. No significant mastoid effusion. ASPECTS [facility] Stroke Program Early CT Score) - Ganglionic level infarction (caudate, lentiform nuclei, internal capsule, insula, M1-M3 cortex): 7 - Supraganglionic infarction (M4-M6 cortex): 3 Total score (0-10 with 10 being normal): 10 IMPRESSION: Normal head CT. Aspects is 10. Results were called by telephone at the time of interpretation on 10/08/2023 at 8:16 pm to provider [facility] , who verbally acknowledged these results. Electronically Signed   By: Virgia Griffins M.D.   On: 10/08/2023 20:17    Labs:  CBC: Recent Labs    06/12/23 0806 07/17/23 1130 10/08/23 1826 10/08/23 2034  WBC 6.3 7.7 8.3 9.2  HGB 12.0 11.9* 12.0 11.7*  HCT 35.8* 35.7* 35.6* 34.2*  PLT 301 281 221 196    COAGS: Recent Labs    10/13/22 1313 11/06/22 0520 10/08/23 2034  INR 1.2 1.2 1.4*  APTT  --  28 30    BMP: Recent Labs    05/16/23 1054 06/12/23 0806 07/17/23 1130 10/08/23 1826  NA 133* 139 136 135  K 3.9 3.8 4.2 3.6  CL 105 108 103 107  CO2 19* 20* 22 14*  GLUCOSE 161* 125* 120* 171*  BUN 31* 26* 20 20  CALCIUM  9.3 9.1 9.0 8.8*  CREATININE 1.31* 1.12* 1.18* 1.09*  GFRNONAA 44* 53* 50* 55*    LIVER FUNCTION TESTS: Recent Labs    05/02/23 1445 06/12/23 0806 07/17/23 1130 10/08/23 2034  BILITOT 0.6 0.3 0.8 0.4  AST 18 31 32 23  ALT 22 31 41 21  ALKPHOS 85 135* 94 55  PROT 8.3* 7.2 7.7 6.6  ALBUMIN  4.4 3.5 4.1 3.6    TUMOR MARKERS: No results for input(s): "AFPTM", "CEA", "CA199", "CHROMGRNA" in the last 8760 hours.  Assessment and Plan:  Metastatic breast cancer with biliary obstruction s/p drain  placement Diamond Hinton is a 68 y.o. female who presents to [facility] Interventional Radiology  department for an image-guided routine drain exchange with Dr. Baldemar Lev on 10/10/23. Procedure to be performed under moderate sedation.  Risks and benefits discussed with the patient including bleeding, infection, damage to adjacent structures, bowel perforation/fistula connection, and sepsis.  All of the patient's questions were answered, patient is agreeable to proceed. Consent signed and in chart.  Thank you for this interesting consult. I greatly enjoyed meeting Diamond Hinton and look forward to participating in their care. A copy of this report was sent to the requesting provider on this date.  Electronically Signed: Nicolasa Barrett, PA-C 10/10/2023, 8:16 AM   I spent a total of 10 Minutes in face to face clinical consultation, greater than 50% of which was counseling/coordinating care for biliary drain exchange

## 2023-10-15 ENCOUNTER — Other Ambulatory Visit: Payer: Self-pay | Admitting: Oncology

## 2023-10-15 DIAGNOSIS — C50912 Malignant neoplasm of unspecified site of left female breast: Secondary | ICD-10-CM

## 2023-10-16 ENCOUNTER — Other Ambulatory Visit: Payer: Self-pay | Admitting: *Deleted

## 2023-10-16 DIAGNOSIS — C50912 Malignant neoplasm of unspecified site of left female breast: Secondary | ICD-10-CM

## 2023-10-17 ENCOUNTER — Other Ambulatory Visit: Payer: Self-pay

## 2023-10-17 ENCOUNTER — Inpatient Hospital Stay: Payer: Medicare HMO | Admitting: Oncology

## 2023-10-17 ENCOUNTER — Encounter: Payer: Self-pay | Admitting: Oncology

## 2023-10-17 ENCOUNTER — Inpatient Hospital Stay: Payer: Medicare HMO | Attending: Oncology

## 2023-10-17 DIAGNOSIS — Z1721 Progesterone receptor positive status: Secondary | ICD-10-CM | POA: Diagnosis not present

## 2023-10-17 DIAGNOSIS — Z79811 Long term (current) use of aromatase inhibitors: Secondary | ICD-10-CM | POA: Insufficient documentation

## 2023-10-17 DIAGNOSIS — Z79899 Other long term (current) drug therapy: Secondary | ICD-10-CM

## 2023-10-17 DIAGNOSIS — C784 Secondary malignant neoplasm of small intestine: Secondary | ICD-10-CM

## 2023-10-17 DIAGNOSIS — Z170 Estrogen receptor positive status [ER+]: Secondary | ICD-10-CM | POA: Insufficient documentation

## 2023-10-17 DIAGNOSIS — C50912 Malignant neoplasm of unspecified site of left female breast: Secondary | ICD-10-CM | POA: Diagnosis not present

## 2023-10-17 DIAGNOSIS — Z1732 Human epidermal growth factor receptor 2 negative status: Secondary | ICD-10-CM | POA: Diagnosis not present

## 2023-10-17 LAB — CBC WITH DIFFERENTIAL (CANCER CENTER ONLY)
Abs Immature Granulocytes: 0.01 10*3/uL (ref 0.00–0.07)
Basophils Absolute: 0.1 10*3/uL (ref 0.0–0.1)
Basophils Relative: 1 %
Eosinophils Absolute: 0.1 10*3/uL (ref 0.0–0.5)
Eosinophils Relative: 1 %
HCT: 37.4 % (ref 36.0–46.0)
Hemoglobin: 12.7 g/dL (ref 12.0–15.0)
Immature Granulocytes: 0 %
Lymphocytes Relative: 33 %
Lymphs Abs: 1.8 10*3/uL (ref 0.7–4.0)
MCH: 31.6 pg (ref 26.0–34.0)
MCHC: 34 g/dL (ref 30.0–36.0)
MCV: 93 fL (ref 80.0–100.0)
Monocytes Absolute: 0.4 10*3/uL (ref 0.1–1.0)
Monocytes Relative: 6 %
Neutro Abs: 3.2 10*3/uL (ref 1.7–7.7)
Neutrophils Relative %: 59 %
Platelet Count: 273 10*3/uL (ref 150–400)
RBC: 4.02 MIL/uL (ref 3.87–5.11)
RDW: 13.8 % (ref 11.5–15.5)
WBC Count: 5.5 10*3/uL (ref 4.0–10.5)
nRBC: 0 % (ref 0.0–0.2)

## 2023-10-17 LAB — CMP (CANCER CENTER ONLY)
ALT: 44 U/L (ref 0–44)
AST: 41 U/L (ref 15–41)
Albumin: 4 g/dL (ref 3.5–5.0)
Alkaline Phosphatase: 74 U/L (ref 38–126)
Anion gap: 10 (ref 5–15)
BUN: 21 mg/dL (ref 8–23)
CO2: 22 mmol/L (ref 22–32)
Calcium: 9.1 mg/dL (ref 8.9–10.3)
Chloride: 107 mmol/L (ref 98–111)
Creatinine: 1.19 mg/dL — ABNORMAL HIGH (ref 0.44–1.00)
GFR, Estimated: 49 mL/min — ABNORMAL LOW (ref >60)
Glucose, Bld: 115 mg/dL — ABNORMAL HIGH (ref 70–99)
Potassium: 4 mmol/L (ref 3.5–5.1)
Sodium: 139 mmol/L (ref 135–145)
Total Bilirubin: 0.4 mg/dL (ref 0.0–1.2)
Total Protein: 7.3 g/dL (ref 6.5–8.1)

## 2023-10-17 NOTE — Progress Notes (Signed)
 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  She was found to have diffuse duodenal wall thickening as well as a palpable mass in the left breast.  Duodenal biopsy as well as left breast biopsy was consistent withER positive greater than 90%, PR weakly +1 to 10% HER2 negative lobular breast cancer.  Patient underwent external biliary drain for the obstructive jaundice and palliative gastrojejunostomy surgery to relieve her gastric outlet obstruction.CT scan otherwise did not show any evidence of distant metastatic disease.   Patient was started on letrozole  plus Kisqali  in early April 2023.  However patient developed AKI and hyperuricemia requiring hospitalizations.  Kisqali  was therefore stopped and patient was started on Ibrance  75 mg in May 2023.NGS testing showed no actionable mutations.  PD-L1 CPS score 1.  MSI stable.  Tumor mutational burden not high.    Interval history-patient was in the ER about 2 weeks ago for vertigo and underwent MRI brain which was negative.  She was given a course of Antivert .  Symptoms have currently resolved.  Her PTC drain was also exchanged recently.  She is presently tolerating Ibrance   and letrozole  well without any significant side effects.  ECOG PS- 1 Pain scale- 0  Review of systems- Review of Systems  Constitutional:  Negative for chills, fever, malaise/fatigue and weight loss.  HENT:  Negative for congestion, ear discharge and nosebleeds.   Eyes:  Negative for blurred vision.  Respiratory:  Negative for cough, hemoptysis, sputum production, shortness of breath and wheezing.   Cardiovascular:  Negative for chest pain, palpitations, orthopnea and claudication.  Gastrointestinal:  Negative for abdominal pain, blood in stool, constipation, diarrhea, heartburn, melena, nausea and vomiting.  Genitourinary:  Negative for dysuria, flank pain, frequency, hematuria and urgency.  Musculoskeletal:  Negative for back pain, joint pain and myalgias.  Skin:  Negative for rash.  Neurological:  Negative for dizziness, tingling, focal weakness, seizures, weakness and headaches.  Endo/Heme/Allergies:  Does not bruise/bleed easily.  Psychiatric/Behavioral:  Negative for depression and suicidal ideas. The patient does not have insomnia.       Allergies  Allergen Reactions   Peanuts [Peanut Oil] Shortness Of Breath and Itching   Levaquin  [Levofloxacin ] Other (See Comments)    Severe abdominal pain and feeling mentally "in a dark place".   Flexeril  [Cyclobenzaprine ] Palpitations    Elevated heart rate after third day of taking     Past Medical History:  Diagnosis Date   Anemia    Anxiety    Breast cancer (HCC)    Gallstones    GERD (gastroesophageal reflux disease)    Hyperlipidemia    Hypertension    Jaundice 08/29/2021   Rash 08/29/2021   Right upper quadrant abdominal pain 08/29/2021   UTI (urinary tract infection) 08/29/2021     Past Surgical History:  Procedure Laterality Date   BILATERAL SALPINGECTOMY  04/03/1997   CHOLECYSTECTOMY N/A 07/07/2015   Procedure: LAPAROSCOPIC CHOLECYSTECTOMY ;  Surgeon: Alben Alma, MD;  Location: [facility] ORS;  Service: General;   Laterality: N/A;   ESOPHAGOGASTRODUODENOSCOPY  08/30/2021   Procedure: ESOPHAGOGASTRODUODENOSCOPY (EGD);  Surgeon: Marnee Sink, MD;  Location: [facility] ENDOSCOPY;  Service: Endoscopy;;   GALLBLADDER SURGERY  07/07/2015   [facility]   IR BILIARY DILITATION  08/22/2022   IR BILIARY DRAIN PLACEMENT WITH CHOLANGIOGRAM  08/31/2021   IR BILIARY STENT(S) NEW ACCESS WITH DRAIN  02/08/2022   IR CHOLANGIOGRAM EXISTING TUBE  08/17/2022   IR CONVERT BILIARY DRAIN TO INT EXT BILIARY DRAIN  09/02/2021   IR EXCHANGE BILIARY DRAIN  10/10/2021   IR EXCHANGE BILIARY DRAIN  12/05/2021   IR EXCHANGE BILIARY DRAIN  01/19/2022   IR EXCHANGE BILIARY DRAIN  10/13/2022   IR EXCHANGE BILIARY DRAIN  11/24/2022   IR EXCHANGE BILIARY DRAIN  01/18/2023   IR EXCHANGE BILIARY DRAIN  03/13/2023   IR EXCHANGE BILIARY DRAIN  04/24/2023   IR EXCHANGE BILIARY DRAIN  06/12/2023   IR EXCHANGE BILIARY DRAIN  08/09/2023   IR EXCHANGE BILIARY DRAIN  10/10/2023   IR INT EXT BILIARY DRAIN WITH CHOLANGIOGRAM  07/17/2022   IR RADIOLOGIST EVAL & MGMT  03/01/2022   IR RADIOLOGIST EVAL & MGMT  08/17/2022    Social History   Socioeconomic History   Marital status: Married    Spouse name: Liana Reding   Number of children: 4   Years of education: Not on file   Highest education level: Not on file  Occupational History   Not on file  Tobacco Use   Smoking status: Never   Smokeless tobacco: Never  Vaping Use   Vaping status: Never Used  Substance and Sexual Activity   Alcohol use: No   Drug use: No   Sexual activity: Yes  Other Topics Concern   Not on file  Social History Narrative   Lives with spouse at home: Liana Reding has Parkinson's. No indoor pets.    Social Drivers of Corporate investment banker Strain: Low Risk  (02/06/2023)   Overall Financial Resource Strain (CARDIA)    Difficulty of Paying Living Expenses: Not hard at all  Food Insecurity: No Food Insecurity (02/06/2023)   Hunger Vital Sign    Worried About Running Out of Food in the Last Year:  Never true    Ran Out of Food in the Last Year: Never true  Transportation Needs: No Transportation Needs (02/06/2023)   PRAPARE - Administrator, Civil Service (Medical): No    Lack of Transportation (Non-Medical): No  Physical Activity: Inactive (02/06/2023)   Exercise Vital Sign    Days of Exercise per Week: 0 days    Minutes of Exercise per Session: 0 min  Stress: No Stress Concern Present (11/24/2021)   [employer] of Occupational Health - Occupational Stress Questionnaire    Feeling of Stress : Only a little  Social Connections: Moderately Isolated (02/06/2023)   Social Connection and Isolation Panel [NHANES]    Frequency of Communication with Friends and Family: More than three times a week    Frequency of Social Gatherings with Friends and Family: Never    Attends Religious Services: Never    Database administrator or Organizations: No    Attends Banker Meetings: Never    Marital Status: Married  Catering manager Violence: Not At Risk (02/06/2023)   Humiliation, Afraid,  Rape, and Kick questionnaire    Fear of Current or Ex-Partner: No    Emotionally Abused: No    Physically Abused: No    Sexually Abused: No    Family History  Problem Relation Age of Onset   Hypertension Mother    CVA Mother    Ulcers Mother    Emphysema Mother    Heart disease Father    Prostate cancer Father    Macular degeneration Father    Hypertension Sister    Diabetes Sister    Cervical polyp Sister    Hypertension Sister    Diabetes Sister    Lymphoma Sister      Current Outpatient Medications:    acetaminophen  (TYLENOL ) 325 MG tablet, Take 325 mg by mouth every 6 (six) hours as needed (for pain and sometimes takes 2 if pain is worse)., Disp: , Rfl:    baclofen  (LIORESAL ) 10 MG tablet, Take 10 mg by mouth 3 (three) times daily as needed., Disp: , Rfl:    letrozole  (FEMARA ) 2.5 MG tablet, Take 1 tablet by mouth once daily, Disp: 90 tablet, Rfl: 0    levofloxacin  (LEVAQUIN ) 250 MG tablet, Take 2 tablets (500 mg total) by mouth daily., Disp: 60 tablet, Rfl: 0   levothyroxine  (SYNTHROID ) 88 MCG tablet, Take 1 tablet by mouth once daily, Disp: 90 tablet, Rfl: 3   meclizine  (ANTIVERT ) 25 MG tablet, Take 1 tablet (25 mg total) by mouth 2 (two) times daily as needed for up to 10 days for dizziness., Disp: 10 tablet, Rfl: 0   Multiple Vitamins-Minerals (CENTRUM VITAMINTS PO), Take 1 tablet by mouth daily., Disp: , Rfl:    ondansetron  (ZOFRAN ) 4 MG tablet, Take 1 tablet (4 mg total) by mouth daily as needed for nausea or vomiting., Disp: 30 tablet, Rfl: 2   oxyCODONE  (ROXICODONE ) 5 MG immediate release tablet, Take 1 tablet (5 mg total) by mouth every 4 (four) hours as needed for severe pain., Disp: 30 tablet, Rfl: 0   palbociclib  (IBRANCE ) 75 MG tablet, Take 1 tablet (75 mg total) by mouth daily. Take for 21 days on, 7 days off, repeat every 28 days., Disp: 21 tablet, Rfl: 0   palbociclib  (IBRANCE ) 75 MG tablet, Take 1 tablet (75 mg total) by mouth daily. Take for 21 days on, 7 days off, repeat every 28 days., Disp: 21 tablet, Rfl: 0   palbociclib  (IBRANCE ) 75 MG tablet, Take 1 tablet (75 mg total) by mouth daily. Take for 21 days on, 7 days off, repeat every 28 days., Disp: 21 tablet, Rfl: 0   pantoprazole  (PROTONIX ) 40 MG tablet, Take 1 tablet by mouth once daily, Disp: 90 tablet, Rfl: 0   potassium chloride  SA (KLOR-CON  M) 20 MEQ tablet, Take 1 tablet (20 mEq total) by mouth daily. (Patient not taking: Reported on 08/20/2023), Disp: 30 tablet, Rfl: 3   prochlorperazine  (COMPAZINE ) 5 MG tablet, Take 1 tablet (5 mg total) by mouth every 6 (six) hours as needed for refractory nausea / vomiting., Disp: 30 tablet, Rfl: 1   traZODone  (DESYREL ) 50 MG tablet, Take 50 mg by mouth at bedtime., Disp: , Rfl:  No current facility-administered medications for this visit.  Facility-Administered Medications Ordered in Other Visits:    cefTRIAXone  (ROCEPHIN ) 2 g in  dextrose  5 % 50 mL IVPB, 2 g, Intravenous, Once, Caper[place], Marissa N, [place]  Physical exam:  Vitals:   10/17/23 1136  BP: 106/67  Pulse: 65  Resp: 19  Temp: (!) 97 F (36.1 C)  TempSrc: Tympanic  SpO2: 99%  Weight: 133 lb 9.6 oz (60.6 kg)  Height: 5\' 3"  (1.6 m)   Physical Exam Cardiovascular:     Rate and Rhythm: Normal rate and regular rhythm.     Heart sounds: Normal heart sounds.  Pulmonary:     Effort: Pulmonary effort is normal.     Breath sounds: Normal breath sounds.  Abdominal:     General: Bowel sounds are normal.     Palpations: Abdomen is soft.     Comments: Ptca in place  Skin:    General: Skin is warm and dry.  Neurological:     Mental Status: She is alert and oriented to person, place, and time.      I have personally reviewed labs listed below:    Latest Ref Rng & Units 10/17/2023   11:15 AM  CMP  Glucose 70 - 99 mg/dL 161   BUN 8 - 23 mg/dL 21   Creatinine 0.96 - 1.00 mg/dL 0.45   Sodium 409 - 811 mmol/L 139   Potassium 3.5 - 5.1 mmol/L 4.0   Chloride 98 - 111 mmol/L 107   CO2 22 - 32 mmol/L 22   Calcium  8.9 - 10.3 mg/dL 9.1   Total Protein 6.5 - 8.1 g/dL 7.3   Total Bilirubin 0.0 - 1.2 mg/dL 0.4   Alkaline Phos 38 - 126 U/L 74   AST 15 - 41 U/L 41   ALT 0 - 44 U/L 44       Latest Ref Rng & Units 10/17/2023   11:16 AM  CBC  WBC 4.0 - 10.5 K/uL 5.5   Hemoglobin 12.0 - 15.0 g/dL 91.4   Hematocrit 78.2 - 46.0 % 37.4   Platelets 150 - 400 K/uL 273    I have personally reviewed Radiology images listed below: No images are attached to the encounter.  IR EXCHANGE BILIARY DRAIN Result Date: 10/10/2023 INDICATION: 68 year old female with history of metastatic breast cancer to the duodenum 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 The patient unfortunately presented with recurrent biliary obstruction in  February 2024 requiring additional right-sided internal external biliary drain placement. She presents today for routine check and exchange. EXAM: Internal/external biliary exchange MEDICATIONS: None. ANESTHESIA/SEDATION: Moderate (conscious) sedation was employed during this procedure. A total of Versed  1 mg and Fentanyl  50 mcg was administered intravenously by the radiology nurse. Total intra-service moderate Sedation Time: 10 minutes. The patient's level of consciousness and vital signs were monitored continuously by radiology nursing throughout the procedure under my direct supervision. FLUOROSCOPY: Radiation Exposure Index (as provided by the fluoroscopic device): 8 mGy Kerma COMPLICATIONS: None immediate. PROCEDURE: Informed written consent was obtained from the patient after a thorough discussion of the procedural risks, benefits and alternatives. All questions were addressed. Maximal Sterile Barrier Technique was utilized including caps, mask, sterile gowns, sterile gloves, sterile drape, hand hygiene and skin antiseptic. A timeout was performed prior to the initiation of the procedure. Initial contrast injection was performed through the existing 14 French internal/external biliary drainage catheter. Distal sideholes may be obstructive, however the drain continues to convey in ejected contrast into the duodenum. The tube was transected and carefully removed over an Amplatz wire. A new 14 French internal/external drainage catheter was carefully advanced through the stent and formed in the duodenum. The proximal side-hole was outside the stent in the right biliary tree. The drain was connected to  gravity bag drainage and secured to the skin with 0 silk suture. IMPRESSION: Successful routine exchange of 14 French internal/external biliary drainage catheter. Electronically Signed   By: Fernando Hoyer M.D.   On: 10/10/2023 13:15   MR BRAIN/IAC W WO CONTRAST Result Date: 10/08/2023 CLINICAL DATA:  Initial  evaluation for acute dizziness. EXAM: MRI HEAD WITHOUT AND WITH CONTRAST TECHNIQUE: Multiplanar, multiecho pulse sequences of the brain and surrounding structures were obtained without and with intravenous contrast. An IAC protocol was utilized. CONTRAST:  6mL GADAVIST  GADOBUTROL  1 MMOL/ML IV SOLN COMPARISON:  CT from earlier the same day. FINDINGS: Brain: Cerebral volume within normal limits. Few scattered patchy subcentimeter foci of T2/FLAIR hyperintensity noted involving the supratentorial cerebral white matter, nonspecific, but most commonly seen in the setting of chronic microvascular ischemic disease. Changes are less than is typically seen for age. No evidence for acute or subacute infarct. Gray-white matter differentiation maintained. No areas of chronic cortical infarction. No acute or chronic intracranial blood products. No mass lesion within the brain itself. No mass effect or midline shift. No hydrocephalus or extra-axial fluid collection. Pituitary gland within normal limits. Pituitary stalk is mildly prominent and thickened, likely similar to prior, and of doubtful significance. Suprasellar region otherwise unremarkable. No abnormal enhancement within the brain itself. Thin section imaging through the internal auditory canals was performed. Seventh and eighth cranial nerves are seen coursing normally through the cerebellopontine angle cisterns into the internal auditory canals. No CPA angle mass. No intracanalicular mass or abnormal enhancement. Inner ear structures including the vestibulae, cochlea, and semi circular canals are normal. Normal post ganglionic enhancement seen within the seventh cranial nerves bilaterally. No significant mastoid effusion. Vascular: Major intracranial vascular flow voids are maintained. Skull and upper cervical spine: Craniocervical junction within normal limits. Bone marrow signal intensity normal. No scalp soft tissue abnormality. Sinuses/Orbits: Globes orbital soft  tissues within normal limits. Paranasal sinuses are largely clear. Other: None. IMPRESSION: Normal MRI of the brain and internal auditory canals. No acute intracranial abnormality. Electronically Signed   By: Virgia Griffins M.D.   On: 10/08/2023 22:54   CT HEAD CODE STROKE WO CONTRAST Result Date: 10/08/2023 CLINICAL DATA:  Code stroke. Initial evaluation for acute neuro deficit, stroke suspected. EXAM: CT HEAD WITHOUT CONTRAST TECHNIQUE: Contiguous axial images were obtained from the base of the skull through the vertex without intravenous contrast. RADIATION DOSE REDUCTION: This exam was performed according to the departmental dose-optimization program which includes automated exposure control, adjustment of the mA and/or kV according to patient size and/or use of iterative reconstruction technique. COMPARISON:  CT from 12/25/2022 FINDINGS: Brain: Cerebral volume within normal limits for patient age. No acute intracranial hemorrhage. No acute large vessel territory infarct. No mass lesion, midline shift, or mass effect. Ventricles are normal in size without hydrocephalus. No extra-axial fluid collection. Vascular: No abnormal hyperdense vessel. Skull: Scalp soft tissues demonstrate no acute abnormality. Calvarium intact. Sinuses/Orbits: Globes and orbital soft tissues within normal limits. Visualized paranasal sinuses are largely clear. No significant mastoid effusion. ASPECTS [facility] Stroke Program Early CT Score) - Ganglionic level infarction (caudate, lentiform nuclei, internal capsule, insula, M1-M3 cortex): 7 - Supraganglionic infarction (M4-M6 cortex): 3 Total score (0-10 with 10 being normal): 10 IMPRESSION: Normal head CT. Aspects is 10. Results were called by telephone at the time of interpretation on 10/08/2023 at 8:16 pm to provider [facility] , who verbally acknowledged these results. Electronically Signed   By: Virgia Griffins M.D.   On: 10/08/2023 20:17  Assessment and plan-  Patient is a 68 y.o. female with metastatic ER/PR positive HER2 negative lobular breast cancer presenting with duodenal obstruction and obstructive jaundice s/p palliative gastrojejunostomy.  And PTC drain placement.  She is currently on letrozole  plus Ibrance  for breast cancer and this is a routine follow-up visit  Patient is tolerating Ibrance   75 mg 3 weeks on and 1 week off well without any significant side effects.  Neutrophils remain more than 1.  Tumor markers from today are pending but overall markers have been stable and within normal limits.  I will see her back in 3 months with CBC with differential CMP CA 27-29 and CA 15-3 with CT chest abdomen and pelvis with contrast prior.  Patient's baseline bone density scan did show osteopenia with a T-score of -1.1 in the AP spine.  10-year probability for major osteoporotic fracture was less than 20% and hip fracture less than3%.  She will therefore continue with calcium  and vitamin D alone and does not require any adjuvant bisphosphonates at this time  Recent episode of vertigo: Improved after a course of Antivert .  Continue to monitor MRI brain was negative for metastatic disease.   Visit Diagnosis 1. 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 High risk medication use   3. Use of letrozole  (Femara )      Dr. Seretha Dance, MD, MPH [facility] [phone] 10/17/2023 12:55 PM

## 2023-10-18 LAB — CA 27.29 (SERIAL MONITOR): CA 27.29: 22.9 U/mL (ref 0.0–38.6)

## 2023-10-18 LAB — CANCER ANTIGEN 15-3: CA 15-3: 26.4 U/mL — ABNORMAL HIGH (ref 0.0–25.0)

## 2023-10-23 ENCOUNTER — Other Ambulatory Visit: Payer: Self-pay | Admitting: Oncology

## 2023-10-23 ENCOUNTER — Other Ambulatory Visit: Payer: Self-pay

## 2023-10-23 ENCOUNTER — Other Ambulatory Visit (HOSPITAL_COMMUNITY): Payer: Self-pay

## 2023-10-23 DIAGNOSIS — C50912 Malignant neoplasm of unspecified site of left female breast: Secondary | ICD-10-CM

## 2023-10-23 MED ORDER — PALBOCICLIB 75 MG PO TABS
75.0000 mg | ORAL_TABLET | Freq: Every day | ORAL | 0 refills | Status: DC
Start: 2023-10-23 — End: 2023-11-22
  Filled 2023-10-23: qty 21, 28d supply, fill #0

## 2023-10-23 NOTE — Progress Notes (Signed)
 Specialty Pharmacy Refill Coordination Note  Diamond Hinton is a 68 y.o. female contacted today regarding refills of specialty medication(s) Palbociclib  (IBRANCE )   Patient requested Delivery   Delivery date: 10/25/23   Verified address: [address] [place]   Medication will be filled on 05.15.25 or when refill approved.   This fill date is pending response to refill request from provider. Patient is aware and if they have not received fill by intended date they must follow up with pharmacy.

## 2023-11-03 NOTE — Progress Notes (Signed)
 Part of stroke code orderset to evaluate for cause of dizzines alcohol and drug screening ordered

## 2023-11-21 ENCOUNTER — Other Ambulatory Visit: Payer: Self-pay

## 2023-11-21 ENCOUNTER — Other Ambulatory Visit: Payer: Self-pay | Admitting: Pharmacy Technician

## 2023-11-21 ENCOUNTER — Other Ambulatory Visit: Payer: Self-pay | Admitting: Oncology

## 2023-11-21 DIAGNOSIS — C50912 Malignant neoplasm of unspecified site of left female breast: Secondary | ICD-10-CM

## 2023-11-21 NOTE — Progress Notes (Signed)
 Specialty Pharmacy Refill Coordination Note  Diamond Hinton is a 68 y.o. female contacted today regarding refills of specialty medication(s) Palbociclib  (IBRANCE )   Patient requested Delivery   Delivery date: 11/23/23   Verified address: [address] [place]   Medication will be filled on As soon as Approved.   This fill date is pending response to refill request from provider. Patient is aware and if they have not received fill by intended date they must follow up with pharmacy.

## 2023-11-22 ENCOUNTER — Other Ambulatory Visit: Payer: Self-pay

## 2023-11-22 ENCOUNTER — Other Ambulatory Visit (HOSPITAL_COMMUNITY): Payer: Self-pay

## 2023-11-22 MED ORDER — PALBOCICLIB 75 MG PO TABS
75.0000 mg | ORAL_TABLET | Freq: Every day | ORAL | 0 refills | Status: DC
  Filled 2023-11-22: qty 21, 28d supply, fill #0

## 2023-12-06 ENCOUNTER — Other Ambulatory Visit: Payer: Self-pay

## 2023-12-06 ENCOUNTER — Other Ambulatory Visit: Payer: Self-pay | Admitting: Oncology

## 2023-12-06 DIAGNOSIS — C50912 Malignant neoplasm of unspecified site of left female breast: Secondary | ICD-10-CM

## 2023-12-06 MED ORDER — PALBOCICLIB 75 MG PO TABS
75.0000 mg | ORAL_TABLET | Freq: Every day | ORAL | 0 refills | Status: DC
  Filled 2023-12-06: qty 21, 28d supply, fill #0

## 2023-12-06 NOTE — Progress Notes (Signed)
 Specialty Pharmacy Refill Coordination Note  Diamond Hinton is a 68 y.o. female contacted today regarding refills of specialty medication(s) Palbociclib  (IBRANCE )   Patient requested Delivery   Delivery date: 12/13/23   Verified address: [address] [place]   Medication will be filled on 12/12/23. This fill date is pending response to refill request from provider. Patient is aware and if they have not received fill by intended date they must follow up with pharmacy. Patient also aware that delivery may be on/after 7/7.

## 2023-12-06 NOTE — Progress Notes (Signed)
 Specialty Pharmacy Ongoing Clinical Assessment Note  ADELYNA BROCKMAN is a 68 y.o. female who is being followed by the specialty pharmacy service for RxSp Oncology   Patient's specialty medication(s) reviewed today: Palbociclib  (IBRANCE )   Missed doses in the last 4 weeks: 0   Patient/Caregiver did not have any additional questions or concerns.   Therapeutic benefit summary: Patient is achieving benefit   Adverse events/side effects summary: No adverse events/side effects   Patient's therapy is appropriate to: Continue    Goals Addressed             This Visit's Progress    Slow Disease Progression   On track    Patient is on track. Patient will maintain adherence and adhere to provider and/or lab appointments. Per last visit note from 10/17/23, tumor markers have remained stable and within normal limits.Patient to have repeat scan in 3 months         Follow up: 6 months  [facility] Specialty Pharmacist

## 2023-12-10 NOTE — Addendum Note (Signed)
 Encounter addended by: Oneita Rollo FALCON, RT on: 12/10/2023 4:48 PM  Actions taken: Imaging Exam ended

## 2023-12-11 ENCOUNTER — Other Ambulatory Visit: Payer: Self-pay | Admitting: Radiology

## 2023-12-11 NOTE — Progress Notes (Signed)
 Patient for IR Biliary drain exchange on Wed 12/12/23, I called and spoke with the patient on the phone and gave pre-procedure instructions. Pt was made aware to be here at 11:30a, NPO after MN prior to procedure as well as driver post procedure/recovery/discharge. Pt stated understanding.  Called 12/07/23

## 2023-12-12 ENCOUNTER — Ambulatory Visit
Admission: RE | Admit: 2023-12-12 | Discharge: 2023-12-12 | Disposition: A | Discharge status: Home / Self Care | Source: Ambulatory Visit | Attending: Interventional Radiology | Admitting: Interventional Radiology

## 2023-12-12 ENCOUNTER — Encounter: Payer: Self-pay | Admitting: Radiology

## 2023-12-12 ENCOUNTER — Other Ambulatory Visit: Payer: Self-pay | Admitting: Interventional Radiology

## 2023-12-12 ENCOUNTER — Other Ambulatory Visit: Payer: Self-pay

## 2023-12-12 DIAGNOSIS — I10 Essential (primary) hypertension: Secondary | ICD-10-CM | POA: Diagnosis not present

## 2023-12-12 DIAGNOSIS — Z853 Personal history of malignant neoplasm of breast: Secondary | ICD-10-CM | POA: Diagnosis not present

## 2023-12-12 DIAGNOSIS — E119 Type 2 diabetes mellitus without complications: Secondary | ICD-10-CM | POA: Insufficient documentation

## 2023-12-12 DIAGNOSIS — K831 Obstruction of bile duct: Secondary | ICD-10-CM

## 2023-12-12 DIAGNOSIS — Z434 Encounter for attention to other artificial openings of digestive tract: Secondary | ICD-10-CM | POA: Insufficient documentation

## 2023-12-12 HISTORY — PX: IR EXCHANGE BILIARY DRAIN: IMG6046

## 2023-12-12 MED ORDER — LIDOCAINE HCL 1 % IJ SOLN
INTRAMUSCULAR | Status: AC
  Filled 2023-12-12: qty 20

## 2023-12-12 MED ORDER — IOHEXOL 300 MG/ML  SOLN
12.0000 mL | Freq: Once | INTRAMUSCULAR | Status: AC | PRN
Start: 2023-12-12 — End: 2023-12-12
  Administered 2023-12-12: 12 mL

## 2023-12-12 MED ORDER — SODIUM CHLORIDE 0.9 % IV SOLN: INTRAVENOUS | Status: DC

## 2023-12-12 MED ORDER — FENTANYL CITRATE (PF) 100 MCG/2ML IJ SOLN
INTRAMUSCULAR | Status: AC | PRN
  Administered 2023-12-12: 50 ug via INTRAVENOUS

## 2023-12-12 MED ORDER — FENTANYL CITRATE (PF) 100 MCG/2ML IJ SOLN
INTRAMUSCULAR | Status: AC
Start: 2023-12-12 — End: 2023-12-12
  Filled 2023-12-12: qty 2

## 2023-12-12 MED ORDER — MIDAZOLAM HCL 2 MG/2ML IJ SOLN
INTRAMUSCULAR | Status: AC | PRN
  Administered 2023-12-12: 1 mg via INTRAVENOUS

## 2023-12-12 MED ORDER — MIDAZOLAM HCL 2 MG/2ML IJ SOLN
INTRAMUSCULAR | Status: AC
  Filled 2023-12-12: qty 2

## 2023-12-12 NOTE — H&P (Signed)
 Chief Complaint: Patient was seen in consultation today for biliary obstruction   Procedure: Routine biliary drain exchange  Referring Physician(s): Dr. Melanee  Supervising Physician: Karalee Beat  Patient Status: [facility] - Out-pt  History of Present Illness: Diamond Hinton is a 68 y.o. 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 She is well-known to IR from initial biliary drain placement 08/31/21. A covered stent was placed into the common bile duct 02/08/22 but unfortunately she developed recurrent biliary obstruction. IR placed an additional 12 Fr right-sided internal/external biliary drain 07/17/22 with balloon angioplasty and drain upsize to a 14 Fr on 08/22/22.   She has undergone routine drain exchanges with moderate sedation since 01/2023  Most recent exchange was 09/2023. She presents today for routine drain exchange.  No new concerns or complaints today.  She has been NPO overnight.    Patient is a FULL CODE.  Past Medical History:  Diagnosis Date   Anemia    Anxiety    Breast cancer (HCC)    Gallstones    GERD (gastroesophageal reflux disease)    Hyperlipidemia    Hypertension    Jaundice 08/29/2021   Rash 08/29/2021   Right upper quadrant abdominal pain 08/29/2021   UTI (urinary tract infection) 08/29/2021    Past Surgical History:  Procedure Laterality Date   BILATERAL SALPINGECTOMY  04/03/1997   CHOLECYSTECTOMY N/A 07/07/2015   Procedure: LAPAROSCOPIC CHOLECYSTECTOMY ;  Surgeon: Laneta JULIANNA Luna, MD;  Location: [facility] ORS;  Service: General;  Laterality: N/A;   ESOPHAGOGASTRODUODENOSCOPY  08/30/2021   Procedure: ESOPHAGOGASTRODUODENOSCOPY (EGD);  Surgeon: Jinny Carmine, MD;  Location: [facility] ENDOSCOPY;  Service: Endoscopy;;   GALLBLADDER SURGERY  07/07/2015   [facility]   IR BILIARY DILITATION  08/22/2022   IR BILIARY DRAIN PLACEMENT WITH CHOLANGIOGRAM   08/31/2021   IR BILIARY STENT(S) NEW ACCESS WITH DRAIN  02/08/2022   IR CHOLANGIOGRAM EXISTING TUBE  08/17/2022   IR CONVERT BILIARY DRAIN TO INT EXT BILIARY DRAIN  09/02/2021   IR EXCHANGE BILIARY DRAIN  10/10/2021   IR EXCHANGE BILIARY DRAIN  12/05/2021   IR EXCHANGE BILIARY DRAIN  01/19/2022   IR EXCHANGE BILIARY DRAIN  10/13/2022   IR EXCHANGE BILIARY DRAIN  11/24/2022   IR EXCHANGE BILIARY DRAIN  01/18/2023   IR EXCHANGE BILIARY DRAIN  03/13/2023   IR EXCHANGE BILIARY DRAIN  04/24/2023   IR EXCHANGE BILIARY DRAIN  06/12/2023   IR EXCHANGE BILIARY DRAIN  08/09/2023   IR EXCHANGE BILIARY DRAIN  10/10/2023   IR INT EXT BILIARY DRAIN WITH CHOLANGIOGRAM  07/17/2022   IR RADIOLOGIST EVAL & MGMT  03/01/2022   IR RADIOLOGIST EVAL & MGMT  08/17/2022    Allergies: Peanuts [peanut oil] and Flexeril  [cyclobenzaprine ]  Medications: Prior to Admission medications   Medication Sig Start Date End Date Taking? Authorizing Provider  acetaminophen  (TYLENOL ) 325 MG tablet Take 325 mg by mouth every 6 (six) hours as needed (for pain and sometimes takes 2 if pain is worse).   Yes [provider]  baclofen  (LIORESAL ) 10 MG tablet Take 10 mg by mouth 3 (three) times daily as needed.   Yes [provider]  letrozole  (FEMARA ) 2.5 MG tablet Take 1 tablet by mouth once daily 07/18/23  Yes Rao, Archana C, MD  levothyroxine  (SYNTHROID ) 88 MCG tablet Take 1 tablet by mouth once daily 09/13/23  Yes Bacigalupo, Angela M, MD  meclizine  (  ANTIVERT ) 25 MG tablet Take 1 tablet (25 mg total) by mouth 2 (two) times daily as needed for up to 10 days for dizziness. 10/08/23 10/18/23 Yes Ernest Ronal BRAVO, MD  Multiple Vitamins-Minerals (CENTRUM VITAMINTS PO) Take 1 tablet by mouth daily.   Yes [provider]  ondansetron  (ZOFRAN ) 4 MG tablet Take 1 tablet (4 mg total) by mouth daily as needed for nausea or vomiting. 01/16/23 01/16/24 Yes Melanee Annah BROCKS, MD  palbociclib  (IBRANCE ) 75 MG tablet Take 1 tablet (75 mg total) by mouth  daily. Take for 21 days on, 7 days off, repeat every 28 days. 06/19/23  Yes Dasie Tinnie MATSU, NP  pantoprazole  (PROTONIX ) 40 MG tablet Take 1 tablet by mouth once daily 09/21/23  Yes Bacigalupo, Angela M, MD  prochlorperazine  (COMPAZINE ) 5 MG tablet Take 1 tablet (5 mg total) by mouth every 6 (six) hours as needed for refractory nausea / vomiting. 02/14/22  Yes Melanee Annah BROCKS, MD  levofloxacin  (LEVAQUIN ) 250 MG tablet Take 2 tablets (500 mg total) by mouth daily. 07/17/23   Melanee Annah BROCKS, MD  oxyCODONE  (ROXICODONE ) 5 MG immediate release tablet Take 1 tablet (5 mg total) by mouth every 4 (four) hours as needed for severe pain. 07/24/22   Bacigalupo, Angela M, MD  palbociclib  (IBRANCE ) 75 MG tablet Take 1 tablet (75 mg total) by mouth daily. Take for 21 days on, 7 days off, repeat every 28 days. 08/24/23   Dasie Tinnie MATSU, NP  palbociclib  (IBRANCE ) 75 MG tablet Take 1 tablet (75 mg total) by mouth daily. Take for 21 days on, 7 days off, repeat every 28 days. 09/26/23   Melanee Annah BROCKS, MD  potassium chloride  SA (KLOR-CON  M) 20 MEQ tablet Take 1 tablet (20 mEq total) by mouth daily. Patient not taking: Reported on 08/20/2023 01/16/23   Melanee Annah BROCKS, MD  traZODone  (DESYREL ) 50 MG tablet Take 50 mg by mouth at bedtime.    [provider]     Family History  Problem Relation Age of Onset   Hypertension Mother    CVA Mother    Ulcers Mother    Emphysema Mother    Heart disease Father    Prostate cancer Father    Macular degeneration Father    Hypertension Sister    Diabetes Sister    Cervical polyp Sister    Hypertension Sister    Diabetes Sister    Lymphoma Sister     Social History   Socioeconomic History   Marital status: Married    Spouse name: Pasco   Number of children: 4   Years of education: Not on file   Highest education level: Not on file  Occupational History   Not on file  Tobacco Use   Smoking status: Never   Smokeless tobacco: Never  Vaping Use   Vaping status: Never  Used  Substance and Sexual Activity   Alcohol use: No   Drug use: No   Sexual activity: Yes  Other Topics Concern   Not on file  Social History Narrative   Lives with spouse at home: Pasco has Parkinson's. No indoor pets.    Social Drivers of Corporate investment banker Strain: Low Risk  (02/06/2023)   Overall Financial Resource Strain (CARDIA)    Difficulty of Paying Living Expenses: Not hard at all  Food Insecurity: No Food Insecurity (02/06/2023)   Hunger Vital Sign    Worried About Running Out of Food in the Last Year: Never true  Ran Out of Food in the Last Year: Never true  Transportation Needs: No Transportation Needs (02/06/2023)   PRAPARE - Administrator, Civil Service (Medical): No    Lack of Transportation (Non-Medical): No  Physical Activity: Inactive (02/06/2023)   Exercise Vital Sign    Days of Exercise per Week: 0 days    Minutes of Exercise per Session: 0 min  Stress: No Stress Concern Present (11/24/2021)   [employer] of Occupational Health - Occupational Stress Questionnaire    Feeling of Stress : Only a little  Social Connections: Moderately Isolated (02/06/2023)   Social Connection and Isolation Panel    Frequency of Communication with Friends and Family: More than three times a week    Frequency of Social Gatherings with Friends and Family: Never    Attends Religious Services: Never    Database administrator or Organizations: No    Attends Banker Meetings: Never    Marital Status: Married    Review of Systems  Constitutional:  Negative for fatigue and fever.  Respiratory:  Negative for cough and shortness of breath.   Cardiovascular:  Negative for chest pain.  Gastrointestinal:  Negative for abdominal pain.  Musculoskeletal:  Negative for back pain.  Psychiatric/Behavioral:  Negative for behavioral problems and confusion.    Denies any N/V, chest pain, shortness of breath, fevers/chills. All other ROS  negative.  Vital Signs: BP 135/77   Pulse 68   Temp 98.4 F (36.9 C) (Oral)   Resp 11   Ht 5' 3 (1.6 m)   Wt 134 lb 1.6 oz (60.8 kg)   SpO2 98%   BMI 23.75 kg/m    Physical Exam Vitals and nursing note reviewed.  Constitutional:      Appearance: Normal appearance.  HENT:     Mouth/Throat:     Mouth: Mucous membranes are moist.     Pharynx: Oropharynx is clear.  Cardiovascular:     Rate and Rhythm: Normal rate and regular rhythm.  Pulmonary:     Effort: Pulmonary effort is normal.     Breath sounds: Normal breath sounds.  Abdominal:     Palpations: Abdomen is soft.     Tenderness: There is no abdominal tenderness.     Comments: + RUQ biliary drain noted. No pain, no overlying abnormality. Bilious output noted in bag.   Musculoskeletal:     Right lower leg: No edema.     Left lower leg: No edema.  Skin:    General: Skin is warm and dry.  Neurological:     Mental Status: She is alert and oriented to person, place, and time. Mental status is at baseline.     Imaging: No results found.   Labs:  CBC: Recent Labs    07/17/23 1130 10/08/23 1826 10/08/23 2034 10/17/23 1116  WBC 7.7 8.3 9.2 5.5  HGB 11.9* 12.0 11.7* 12.7  HCT 35.7* 35.6* 34.2* 37.4  PLT 281 221 196 273    COAGS: Recent Labs    10/08/23 2034  INR 1.4*  APTT 30    BMP: Recent Labs    06/12/23 0806 07/17/23 1130 10/08/23 1826 10/17/23 1115  NA 139 136 135 139  K 3.8 4.2 3.6 4.0  CL 108 103 107 107  CO2 20* 22 14* 22  GLUCOSE 125* 120* 171* 115*  BUN 26* 20 20 21   CALCIUM  9.1 9.0 8.8* 9.1  CREATININE 1.12* 1.18* 1.09* 1.19*  GFRNONAA 53* 50* 55* 49*  LIVER FUNCTION TESTS: Recent Labs    06/12/23 0806 07/17/23 1130 10/08/23 2034 10/17/23 1115  BILITOT 0.3 0.8 0.4 0.4  AST 31 32 23 41  ALT 31 41 21 44  ALKPHOS 135* 94 55 74  PROT 7.2 7.7 6.6 7.3  ALBUMIN  3.5 4.1 3.6 4.0    TUMOR MARKERS: No results for input(s): AFPTM, CEA, CA199, CHROMGRNA in the last  8760 hours.  Assessment and Plan:  Metastatic breast cancer with biliary obstruction s/p drain placement IEASHA BOEREMA is a 68 y.o. female with a 14 Fr biliary drain who presents for routine drain exchange with moderate sedation.  She has been NPO.  Risks and benefits discussed with the patient including bleeding, infection, damage to adjacent structures, bowel perforation/fistula connection, and sepsis.  All of the patient's questions were answered, patient is agreeable to proceed. Consent signed and in chart.  Thank you for this interesting consult. I greatly enjoyed meeting Diamond Hinton and look forward to participating in their care. A copy of this report was sent to the requesting provider on this date.  Electronically Signed: Dilyn Smiles Sue-Ellen Lyell Clugston, PA 12/12/2023, 12:26 PM   I spent a total of 10 Minutes in face to face clinical consultation, greater than 50% of which was counseling/coordinating care for biliary obstruction.

## 2023-12-17 ENCOUNTER — Other Ambulatory Visit: Payer: Self-pay | Admitting: Family Medicine

## 2023-12-19 NOTE — Telephone Encounter (Signed)
 Requested Prescriptions  Pending Prescriptions Disp Refills   pantoprazole  (PROTONIX ) 40 MG tablet [Pharmacy Med Name: Pantoprazole  Sodium 40 MG Oral Tablet Delayed Release] 90 tablet 0    Sig: Take 1 tablet by mouth once daily     Gastroenterology: Proton Pump Inhibitors Passed - 12/19/2023  8:39 AM      Passed - Valid encounter within last 12 months    Recent Outpatient Visits           4 months ago Type 2 diabetes mellitus with stage 3a chronic kidney disease, without long-term current use of insulin  [facility])   [facility] Bacigalupo, Jon HERO, MD       Future Appointments             In 2 months Bacigalupo, Jon HERO, MD [facility]

## 2023-12-24 ENCOUNTER — Other Ambulatory Visit (HOSPITAL_COMMUNITY): Payer: Self-pay

## 2024-01-02 ENCOUNTER — Other Ambulatory Visit: Payer: Self-pay

## 2024-01-02 ENCOUNTER — Other Ambulatory Visit: Payer: Self-pay | Admitting: Oncology

## 2024-01-02 DIAGNOSIS — C50912 Malignant neoplasm of unspecified site of left female breast: Secondary | ICD-10-CM

## 2024-01-02 MED ORDER — PALBOCICLIB 75 MG PO TABS
75.0000 mg | ORAL_TABLET | Freq: Every day | ORAL | 0 refills | Status: DC
  Filled 2024-01-02: qty 21, 21d supply, fill #0
  Filled 2024-01-04 (×2): qty 21, 28d supply, fill #0

## 2024-01-04 ENCOUNTER — Other Ambulatory Visit: Payer: Self-pay

## 2024-01-04 ENCOUNTER — Other Ambulatory Visit: Payer: Self-pay | Admitting: Pharmacy Technician

## 2024-01-04 NOTE — Progress Notes (Signed)
 Specialty Pharmacy Refill Coordination Note  Diamond Hinton is a 68 y.o. female contacted today regarding refills of specialty medication(s) Palbociclib  (IBRANCE )   Patient requested Delivery   Delivery date: 01/16/24   Verified address: [address] [place] [address]   Medication will be filled on 01/15/24.

## 2024-01-09 ENCOUNTER — Other Ambulatory Visit: Payer: Self-pay | Admitting: Oncology

## 2024-01-09 ENCOUNTER — Other Ambulatory Visit: Payer: Self-pay

## 2024-01-09 DIAGNOSIS — C50912 Malignant neoplasm of unspecified site of left female breast: Secondary | ICD-10-CM

## 2024-01-14 ENCOUNTER — Ambulatory Visit
Admission: RE | Admit: 2024-01-14 | Discharge: 2024-01-14 | Disposition: A | Discharge status: Home / Self Care | Source: Ambulatory Visit | Attending: Oncology | Admitting: Oncology

## 2024-01-14 DIAGNOSIS — C50912 Malignant neoplasm of unspecified site of left female breast: Secondary | ICD-10-CM | POA: Diagnosis not present

## 2024-01-14 DIAGNOSIS — Z79899 Other long term (current) drug therapy: Secondary | ICD-10-CM | POA: Diagnosis not present

## 2024-01-14 DIAGNOSIS — C179 Malignant neoplasm of small intestine, unspecified: Secondary | ICD-10-CM | POA: Diagnosis not present

## 2024-01-14 DIAGNOSIS — K8689 Other specified diseases of pancreas: Secondary | ICD-10-CM | POA: Diagnosis not present

## 2024-01-14 DIAGNOSIS — J9811 Atelectasis: Secondary | ICD-10-CM | POA: Diagnosis not present

## 2024-01-14 DIAGNOSIS — Z9049 Acquired absence of other specified parts of digestive tract: Secondary | ICD-10-CM | POA: Diagnosis not present

## 2024-01-14 DIAGNOSIS — C784 Secondary malignant neoplasm of small intestine: Secondary | ICD-10-CM | POA: Diagnosis not present

## 2024-01-14 LAB — POCT I-STAT CREATININE: Creatinine, Ser: 1.2 mg/dL — ABNORMAL HIGH (ref 0.44–1.00)

## 2024-01-14 MED ORDER — IOHEXOL 300 MG/ML  SOLN
100.0000 mL | Freq: Once | INTRAMUSCULAR | Status: AC | PRN
  Administered 2024-01-14: 100 mL via INTRAVENOUS

## 2024-01-29 ENCOUNTER — Inpatient Hospital Stay: Attending: Oncology

## 2024-01-29 ENCOUNTER — Inpatient Hospital Stay (HOSPITAL_BASED_OUTPATIENT_CLINIC_OR_DEPARTMENT_OTHER): Admitting: Oncology

## 2024-01-29 ENCOUNTER — Encounter: Payer: Self-pay | Admitting: Oncology

## 2024-01-29 ENCOUNTER — Other Ambulatory Visit: Payer: Self-pay

## 2024-01-29 VITALS — BP 106/76 | HR 72 | Temp 97.1°F | Resp 19 | Ht 63.0 in | Wt 128.5 lb

## 2024-01-29 DIAGNOSIS — Z1732 Human epidermal growth factor receptor 2 negative status: Secondary | ICD-10-CM | POA: Diagnosis not present

## 2024-01-29 DIAGNOSIS — N179 Acute kidney failure, unspecified: Secondary | ICD-10-CM

## 2024-01-29 DIAGNOSIS — Z79899 Other long term (current) drug therapy: Secondary | ICD-10-CM | POA: Insufficient documentation

## 2024-01-29 DIAGNOSIS — Z807 Family history of other malignant neoplasms of lymphoid, hematopoietic and related tissues: Secondary | ICD-10-CM | POA: Diagnosis not present

## 2024-01-29 DIAGNOSIS — R7989 Other specified abnormal findings of blood chemistry: Secondary | ICD-10-CM | POA: Diagnosis not present

## 2024-01-29 DIAGNOSIS — C784 Secondary malignant neoplasm of small intestine: Secondary | ICD-10-CM | POA: Diagnosis not present

## 2024-01-29 DIAGNOSIS — Z79811 Long term (current) use of aromatase inhibitors: Secondary | ICD-10-CM | POA: Insufficient documentation

## 2024-01-29 DIAGNOSIS — C50912 Malignant neoplasm of unspecified site of left female breast: Secondary | ICD-10-CM

## 2024-01-29 DIAGNOSIS — Z1721 Progesterone receptor positive status: Secondary | ICD-10-CM | POA: Insufficient documentation

## 2024-01-29 DIAGNOSIS — Z170 Estrogen receptor positive status [ER+]: Secondary | ICD-10-CM | POA: Insufficient documentation

## 2024-01-29 DIAGNOSIS — R945 Abnormal results of liver function studies: Secondary | ICD-10-CM | POA: Insufficient documentation

## 2024-01-29 LAB — CMP (CANCER CENTER ONLY)
ALT: 80 U/L — ABNORMAL HIGH (ref 0–44)
AST: 27 U/L (ref 15–41)
Albumin: 4 g/dL (ref 3.5–5.0)
Alkaline Phosphatase: 114 U/L (ref 38–126)
Anion gap: 8 (ref 5–15)
BUN: 34 mg/dL — ABNORMAL HIGH (ref 8–23)
CO2: 18 mmol/L — ABNORMAL LOW (ref 22–32)
Calcium: 9 mg/dL (ref 8.9–10.3)
Chloride: 106 mmol/L (ref 98–111)
Creatinine: 1.45 mg/dL — ABNORMAL HIGH (ref 0.44–1.00)
GFR, Estimated: 39 mL/min — ABNORMAL LOW (ref >60)
Glucose, Bld: 134 mg/dL — ABNORMAL HIGH (ref 70–99)
Potassium: 4.1 mmol/L (ref 3.5–5.1)
Sodium: 132 mmol/L — ABNORMAL LOW (ref 135–145)
Total Bilirubin: 0.7 mg/dL (ref 0.0–1.2)
Total Protein: 7.8 g/dL (ref 6.5–8.1)

## 2024-01-29 LAB — CBC WITH DIFFERENTIAL (CANCER CENTER ONLY)
Abs Immature Granulocytes: 0.03 K/uL (ref 0.00–0.07)
Basophils Absolute: 0 K/uL (ref 0.0–0.1)
Basophils Relative: 1 %
Eosinophils Absolute: 0.1 K/uL (ref 0.0–0.5)
Eosinophils Relative: 1 %
HCT: 37.3 % (ref 36.0–46.0)
Hemoglobin: 12.6 g/dL (ref 12.0–15.0)
Immature Granulocytes: 1 %
Lymphocytes Relative: 27 %
Lymphs Abs: 1.5 K/uL (ref 0.7–4.0)
MCH: 31.6 pg (ref 26.0–34.0)
MCHC: 33.8 g/dL (ref 30.0–36.0)
MCV: 93.5 fL (ref 80.0–100.0)
Monocytes Absolute: 0.3 K/uL (ref 0.1–1.0)
Monocytes Relative: 5 %
Neutro Abs: 3.8 K/uL (ref 1.7–7.7)
Neutrophils Relative %: 65 %
Platelet Count: 292 K/uL (ref 150–400)
RBC: 3.99 MIL/uL (ref 3.87–5.11)
RDW: 14.4 % (ref 11.5–15.5)
WBC Count: 5.7 K/uL (ref 4.0–10.5)
nRBC: 0 % (ref 0.0–0.2)

## 2024-01-29 MED ORDER — ONDANSETRON HCL 4 MG PO TABS: 4.0000 mg | ORAL_TABLET | Freq: Three times a day (TID) | ORAL | 2 refills | Status: AC | PRN

## 2024-01-29 MED ORDER — PROCHLORPERAZINE MALEATE 5 MG PO TABS: 5.0000 mg | ORAL_TABLET | Freq: Four times a day (QID) | ORAL | 2 refills | Status: AC | PRN

## 2024-01-29 NOTE — Progress Notes (Signed)
 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  She was found to have diffuse duodenal wall thickening as well as a palpable mass in the left breast.  Duodenal biopsy as well as left breast biopsy was consistent withER positive greater than 90%, PR weakly +1 to 10% HER2 negative lobular breast cancer.  Patient underwent external biliary drain for the obstructive jaundice and palliative gastrojejunostomy surgery to relieve her gastric outlet obstruction.CT scan otherwise did not show any evidence of distant metastatic disease.   Patient was started on letrozole  plus Kisqali  in early April 2023.  However patient developed AKI and hyperuricemia requiring hospitalizations.  Kisqali  was therefore stopped and patient was started on Ibrance  75 mg in May 2023.NGS testing showed no actionable mutations.  PD-L1 CPS score 1.  MSI stable.  Tumor mutational burden not high.    Interval history-patient is presently doing well and is tolerating letrozole  plus Ibrance  well without any significant side effects.  Denies any new concerns today  ECOG PS- 1 Pain scale- 0   Review of systems- Review of Systems  Constitutional:  Negative for chills, fever,  malaise/fatigue and weight loss.  HENT:  Negative for congestion, ear discharge and nosebleeds.   Eyes:  Negative for blurred vision.  Respiratory:  Negative for cough, hemoptysis, sputum production, shortness of breath and wheezing.   Cardiovascular:  Negative for chest pain, palpitations, orthopnea and claudication.  Gastrointestinal:  Negative for abdominal pain, blood in stool, constipation, diarrhea, heartburn, melena, nausea and vomiting.  Genitourinary:  Negative for dysuria, flank pain, frequency, hematuria and urgency.  Musculoskeletal:  Negative for back pain, joint pain and myalgias.  Skin:  Negative for rash.  Neurological:  Negative for dizziness, tingling, focal weakness, seizures, weakness and headaches.  Endo/Heme/Allergies:  Does not bruise/bleed easily.  Psychiatric/Behavioral:  Negative for depression and suicidal ideas. The patient does not have insomnia.       Allergies  Allergen Reactions   Peanuts [Peanut Oil] Shortness Of Breath and Itching   Flexeril  [Cyclobenzaprine ] Palpitations    Elevated heart rate after third day of taking     Past Medical History:  Diagnosis Date   Anemia    Anxiety    Breast cancer (HCC)    Gallstones    GERD (gastroesophageal reflux disease)    Hyperlipidemia    Hypertension    Jaundice 08/29/2021   Rash 08/29/2021   Right upper quadrant abdominal pain 08/29/2021   UTI (urinary tract infection) 08/29/2021     Past Surgical History:  Procedure Laterality Date   BILATERAL SALPINGECTOMY  04/03/1997   CHOLECYSTECTOMY N/A 07/07/2015   Procedure: LAPAROSCOPIC CHOLECYSTECTOMY ;  Surgeon: Laneta FALCON Luna, MD;  Location: [facility] ORS;  Service: General;  Laterality: N/A;   ESOPHAGOGASTRODUODENOSCOPY  08/30/2021   Procedure: ESOPHAGOGASTRODUODENOSCOPY (EGD);  Surgeon: Jinny Carmine, MD;  Location: [facility]  ENDOSCOPY;  Service: Endoscopy;;   GALLBLADDER SURGERY  07/07/2015   [facility]   IR BILIARY DILITATION  08/22/2022   IR BILIARY DRAIN PLACEMENT  WITH CHOLANGIOGRAM  08/31/2021   IR BILIARY STENT(S) NEW ACCESS WITH DRAIN  02/08/2022   IR CHOLANGIOGRAM EXISTING TUBE  08/17/2022   IR CONVERT BILIARY DRAIN TO INT EXT BILIARY DRAIN  09/02/2021   IR EXCHANGE BILIARY DRAIN  10/10/2021   IR EXCHANGE BILIARY DRAIN  12/05/2021   IR EXCHANGE BILIARY DRAIN  01/19/2022   IR EXCHANGE BILIARY DRAIN  10/13/2022   IR EXCHANGE BILIARY DRAIN  11/24/2022   IR EXCHANGE BILIARY DRAIN  01/18/2023   IR EXCHANGE BILIARY DRAIN  03/13/2023   IR EXCHANGE BILIARY DRAIN  04/24/2023   IR EXCHANGE BILIARY DRAIN  06/12/2023   IR EXCHANGE BILIARY DRAIN  08/09/2023   IR EXCHANGE BILIARY DRAIN  10/10/2023   IR EXCHANGE BILIARY DRAIN  12/12/2023   IR INT EXT BILIARY DRAIN WITH CHOLANGIOGRAM  07/17/2022   IR RADIOLOGIST EVAL & MGMT  03/01/2022   IR RADIOLOGIST EVAL & MGMT  08/17/2022    Social History   Socioeconomic History   Marital status: Married    Spouse name: Pasco   Number of children: 4   Years of education: Not on file   Highest education level: Not on file  Occupational History   Not on file  Tobacco Use   Smoking status: Never   Smokeless tobacco: Never  Vaping Use   Vaping status: Never Used  Substance and Sexual Activity   Alcohol use: No   Drug use: No   Sexual activity: Yes  Other Topics Concern   Not on file  Social History Narrative   Lives with spouse at home: Pasco has Parkinson's. No indoor pets.    Social Drivers of Corporate investment banker Strain: Low Risk  (02/06/2023)   Overall Financial Resource Strain (CARDIA)    Difficulty of Paying Living Expenses: Not hard at all  Food Insecurity: No Food Insecurity (02/06/2023)   Hunger Vital Sign    Worried About Running Out of Food in the Last Year: Never true    Ran Out of Food in the Last Year: Never true  Transportation Needs: No Transportation Needs (02/06/2023)   PRAPARE - Administrator, Civil Service (Medical): No    Lack of Transportation (Non-Medical): No  Physical Activity:  Inactive (02/06/2023)   Exercise Vital Sign    Days of Exercise per Week: 0 days    Minutes of Exercise per Session: 0 min  Stress: No Stress Concern Present (11/24/2021)   [employer] of Occupational Health - Occupational Stress Questionnaire    Feeling of Stress : Only a little  Social Connections: Moderately Isolated (02/06/2023)   Social Connection and Isolation Panel    Frequency of Communication with Friends and Family: More than three times a week    Frequency of Social Gatherings with Friends and Family: Never    Attends Religious Services: Never    Database administrator or Organizations: No    Attends Banker Meetings: Never    Marital Status: Married  Catering manager Violence: Not At Risk (02/06/2023)   Humiliation, Afraid, Rape, and Kick questionnaire    Fear of Current or Ex-Partner: No    Emotionally Abused: No    Physically Abused: No    Sexually Abused: No    Family History  Problem Relation Age of Onset   Hypertension Mother  CVA Mother    Ulcers Mother    Emphysema Mother    Heart disease Father    Prostate cancer Father    Macular degeneration Father    Hypertension Sister    Diabetes Sister    Cervical polyp Sister    Hypertension Sister    Diabetes Sister    Lymphoma Sister      Current Outpatient Medications:    acetaminophen  (TYLENOL ) 325 MG tablet, Take 325 mg by mouth every 6 (six) hours as needed (for pain and sometimes takes 2 if pain is worse)., Disp: , Rfl:    baclofen  (LIORESAL ) 10 MG tablet, Take 10 mg by mouth 3 (three) times daily as needed., Disp: , Rfl:    letrozole  (FEMARA ) 2.5 MG tablet, Take 1 tablet by mouth once daily, Disp: 90 tablet, Rfl: 0   levofloxacin  (LEVAQUIN ) 250 MG tablet, Take 2 tablets (500 mg total) by mouth daily., Disp: 60 tablet, Rfl: 0   levothyroxine  (SYNTHROID ) 88 MCG tablet, Take 1 tablet by mouth once daily, Disp: 90 tablet, Rfl: 3   Multiple Vitamins-Minerals (CENTRUM VITAMINTS PO), Take 1  tablet by mouth daily., Disp: , Rfl:    oxyCODONE  (ROXICODONE ) 5 MG immediate release tablet, Take 1 tablet (5 mg total) by mouth every 4 (four) hours as needed for severe pain., Disp: 30 tablet, Rfl: 0   palbociclib  (IBRANCE ) 75 MG tablet, Take 1 tablet (75 mg total) by mouth daily. Take for 21 days on, 7 days off, repeat every 28 days., Disp: 21 tablet, Rfl: 0   palbociclib  (IBRANCE ) 75 MG tablet, Take 1 tablet (75 mg total) by mouth daily. Take for 21 days on, 7 days off, repeat every 28 days., Disp: 21 tablet, Rfl: 0   palbociclib  (IBRANCE ) 75 MG tablet, Take 1 tablet (75 mg total) by mouth daily. Take for 21 days on, 7 days off, repeat every 28 days., Disp: 21 tablet, Rfl: 0   pantoprazole  (PROTONIX ) 40 MG tablet, Take 1 tablet by mouth once daily, Disp: 90 tablet, Rfl: 0   potassium chloride  SA (KLOR-CON  M) 20 MEQ tablet, Take 1 tablet (20 mEq total) by mouth daily., Disp: 30 tablet, Rfl: 3   traZODone  (DESYREL ) 50 MG tablet, Take 50 mg by mouth at bedtime., Disp: , Rfl:    ondansetron  (ZOFRAN ) 4 MG tablet, Take 1 tablet (4 mg total) by mouth every 8 (eight) hours as needed for nausea or vomiting., Disp: 20 tablet, Rfl: 2   prochlorperazine  (COMPAZINE ) 5 MG tablet, Take 1 tablet (5 mg total) by mouth every 6 (six) hours as needed for refractory nausea / vomiting., Disp: 30 tablet, Rfl: 2 No current facility-administered medications for this visit.  Facility-Administered Medications Ordered in Other Visits:    cefTRIAXone  (ROCEPHIN ) 2 g in dextrose  5 % 50 mL IVPB, 2 g, Intravenous, Once, Caperilla, Marissa N, [place]  Physical exam:  Vitals:   01/29/24 1135  BP: 106/76  Pulse: 72  Resp: 19  Temp: (!) 97.1 F (36.2 C)  TempSrc: Tympanic  SpO2: 99%  Weight: 128 lb 8 oz (58.3 kg)  Height: 5' 3 (1.6 m)   Physical Exam Cardiovascular:     Rate and Rhythm: Normal rate and regular rhythm.     Heart sounds: Normal heart sounds.  Pulmonary:     Effort: Pulmonary effort is normal.     Breath  sounds: Normal breath sounds.  Skin:    General: Skin is warm and dry.  Neurological:     Mental Status:  She is alert and oriented to person, place, and time.      I have personally reviewed labs listed below:    Latest Ref Rng & Units 01/29/2024   11:21 AM  CMP  Glucose 70 - 99 mg/dL 865   BUN 8 - 23 mg/dL 34   Creatinine 9.55 - 1.00 mg/dL 8.54   Sodium 864 - 854 mmol/L 132   Potassium 3.5 - 5.1 mmol/L 4.1   Chloride 98 - 111 mmol/L 106   CO2 22 - 32 mmol/L 18   Calcium  8.9 - 10.3 mg/dL 9.0   Total Protein 6.5 - 8.1 g/dL 7.8   Total Bilirubin 0.0 - 1.2 mg/dL 0.7   Alkaline Phos 38 - 126 U/L 114   AST 15 - 41 U/L 27   ALT 0 - 44 U/L 80       Latest Ref Rng & Units 01/29/2024   11:21 AM  CBC  WBC 4.0 - 10.5 K/uL 5.7   Hemoglobin 12.0 - 15.0 g/dL 87.3   Hematocrit 63.9 - 46.0 % 37.3   Platelets 150 - 400 K/uL 292    I have personally reviewed Radiology images listed below: No images are attached to the encounter.  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 COMPARISON: 07/04/2023 FINDINGS: The cardiac size is within normal limits. There is no thoracic aortic aneurysm. No filling defects are identified in the central pulmonary arteries. The esophagus has a normal appearance. There is no mass or adenopathy in the chest. No pleural or pericardial effusion is present. Mild discoid atelectasis is present in the right lower lobe. The lungs are otherwise clear. There is no suspicious pulmonary mass. The percutaneous biliary drainage catheter is unchanged in position, extending through the right hepatic lobe into the third portion of the duodenum. This decompresses the right biliary tree. There is persistent left-sided biliary ductal dilatation  which is unchanged. Stable dilatation of the common duct is also present. The gallbladder is surgically absent. No hepatic masses are identified. There is no splenomegaly. Stable dilatation of the main pancreatic duct is present. There are stable postsurgical changes in the stomach. No calculus, obstruction, or soft tissue mass is present involving the kidneys. There are no adrenal masses. The abdominal bowel loops are unremarkable. There is no evidence of ascites or adenopathy. No abdominal aortic aneurysm is present. The bladder, uterus and adnexa have a normal appearance. There is no inguinal hernia. The pelvic bowel loops have a normal appearance. The appendix has a normal appearance. There is no fracture or bone destruction. IMPRESSION: 1. No evidence of acute process. 2. Stable biliary ductal dilatation with biliary drainage catheter in satisfactory position. 3. No new areas of metastatic disease have developed. Please note that CT scanning at this site utilizes multiple dose reduction techniques, including automatic exposure control, adjustment of the MAA and/or KVP according to the patient's size, and use of iterative reconstruction. Electronically signed by: Eddy Oar MD 01/24/2024 01:13 PM EDT RP Workstation: [id number]     Assessment and plan- Patient is a 68 y.o. female with metastatic ER/PR positive HER2 negative lobular breast cancer presenting with duodenal obstruction and obstructive jaundice s/p palliative gastrojejunostomy and PTCA drain placement.  She is here for routine follow-up of metastatic breast cancer and discuss CT  scan results and further management   I have reviewed CT chest abdomen pelvis images independently and discussed findings with the patient which does not show any evidence of disease progression.  She has known metastatic lobular breast cancer which caused gastric outlet obstruction in the duodenum as well as obstructive jaundice.  She is s/p palliative  gastrojejunostomy and PTCA drain placement and presently has stable disease on Ibrance  plus letrozole  which she will continue until progression or toxicity.  CA 15-3 from today is pending but prior values have been stable between 20-30.  No clear rising trend.  She was noted to have mildly elevated creatinine of 1.45 as well as ALT of 80.  I will repeat her CMP in 2 weeks time.  She has been on this present regimen for over 2 years now.  Labs CBC with differential CMP CA 15-3 in 3 and 6 months and I will see her back in 6 months   Visit Diagnosis 1. 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 High risk medication use   3. Use of letrozole  (Femara )   4. AKI (acute kidney injury) (HCC)   5. Abnormal LFTs      Dr. Annah Skene, MD, MPH [facility] at [facility] [phone] 01/29/2024 1:21 PM

## 2024-01-30 LAB — CA 27.29 (SERIAL MONITOR): CA 27.29: 27.8 U/mL (ref 0.0–38.6)

## 2024-01-30 LAB — CANCER ANTIGEN 15-3: CA 15-3: 28.3 U/mL — ABNORMAL HIGH (ref 0.0–25.0)

## 2024-02-07 ENCOUNTER — Other Ambulatory Visit: Payer: Self-pay | Admitting: Nurse Practitioner

## 2024-02-07 ENCOUNTER — Other Ambulatory Visit: Payer: Self-pay

## 2024-02-07 ENCOUNTER — Other Ambulatory Visit (HOSPITAL_COMMUNITY): Payer: Self-pay

## 2024-02-07 DIAGNOSIS — C50912 Malignant neoplasm of unspecified site of left female breast: Secondary | ICD-10-CM

## 2024-02-07 MED ORDER — PALBOCICLIB 75 MG PO TABS
75.0000 mg | ORAL_TABLET | Freq: Every day | ORAL | 0 refills | Status: DC
  Filled 2024-02-07: qty 21, 28d supply, fill #0
  Filled 2024-02-07: qty 21, 21d supply, fill #0

## 2024-02-07 NOTE — Progress Notes (Signed)
 Specialty Pharmacy Refill Coordination Note  Spoke with Joeanne Robicheaux is a 68 y.o. female contacted today regarding refills of specialty medication(s) Palbociclib  (IBRANCE )  Doses on hand: 2, next cycle 02/17/24  Patient requested: Delivery   Delivery date: 02/13/24   Verified address: [address] [place] [address]  Medication will be filled on 02/12/24. [employer]

## 2024-02-12 ENCOUNTER — Other Ambulatory Visit: Payer: Self-pay

## 2024-02-12 DIAGNOSIS — C50912 Malignant neoplasm of unspecified site of left female breast: Secondary | ICD-10-CM

## 2024-02-13 ENCOUNTER — Inpatient Hospital Stay

## 2024-02-19 NOTE — Progress Notes (Signed)
 Patient for IR Biliary drain exchange on Wed 02/20/24, I called and spoke with the patient on the phone and gave pre-procedure instructions. Pt was made aware to be here at 10a and check in at the new entrance. Pt stated understanding. Called 02/18/24

## 2024-02-20 ENCOUNTER — Ambulatory Visit
Admission: RE | Admit: 2024-02-20 | Discharge: 2024-02-20 | Disposition: A | Discharge status: Home / Self Care | Source: Ambulatory Visit | Attending: Interventional Radiology | Admitting: Interventional Radiology

## 2024-02-20 ENCOUNTER — Inpatient Hospital Stay

## 2024-02-20 DIAGNOSIS — K831 Obstruction of bile duct: Secondary | ICD-10-CM | POA: Diagnosis not present

## 2024-02-20 DIAGNOSIS — Z434 Encounter for attention to other artificial openings of digestive tract: Secondary | ICD-10-CM | POA: Diagnosis not present

## 2024-02-20 DIAGNOSIS — Z4803 Encounter for change or removal of drains: Secondary | ICD-10-CM | POA: Diagnosis not present

## 2024-02-20 HISTORY — PX: IR EXCHANGE BILIARY DRAIN: IMG6046

## 2024-02-20 MED ORDER — IOHEXOL 300 MG/ML  SOLN
8.0000 mL | Freq: Once | INTRAMUSCULAR | Status: AC | PRN
  Administered 2024-02-20: 8 mL

## 2024-02-21 ENCOUNTER — Encounter: Admitting: Family Medicine

## 2024-02-25 ENCOUNTER — Telehealth: Payer: Self-pay | Admitting: Oncology

## 2024-02-25 ENCOUNTER — Other Ambulatory Visit

## 2024-02-25 DIAGNOSIS — C50912 Malignant neoplasm of unspecified site of left female breast: Secondary | ICD-10-CM

## 2024-02-25 NOTE — Addendum Note (Signed)
 Addended by: FAUSTINO BENCE on: 02/25/2024 11:09 AM   Modules accepted: Orders

## 2024-02-25 NOTE — Telephone Encounter (Signed)
 Pt left a vm stated she has postponed her lab work a couple of times and she thinks she is experiencing kidney problems. Please advise and call pt back thank you

## 2024-02-25 NOTE — Telephone Encounter (Signed)
 She can come for cbc with diff and cmp today

## 2024-02-27 ENCOUNTER — Ambulatory Visit: Payer: Self-pay

## 2024-02-27 NOTE — Telephone Encounter (Signed)
 Noted

## 2024-02-27 NOTE — Telephone Encounter (Signed)
 FYI Only or Action Required?: FYI only for provider.  Patient was last seen in primary care on 08/20/2023 by Myrla Jon HERO, MD.  Called Nurse Triage reporting Flank Pain.  Symptoms began several days ago.  Interventions attempted: Nothing.  Symptoms are: stable.  Triage Disposition: See PCP When Office is Open (Within 3 Days)  Patient/caregiver understands and will follow disposition?: Yes  Reason for Disposition  [1] MILD pain (i.e., scale 1-3; does not interfere with normal activities) AND [2] present > 3 days  Answer Assessment - Initial Assessment Questions 1. LOCATION: Where does it hurt? (e.g., left, right)     Left side, sometimes in the front near hip bone and in the back.  2. ONSET: When did the pain start?     July right after CT with contrast scan, pain went away. had a procedure with contrast on 9/10 and now it's back  3. SEVERITY: How bad is the pain? (e.g., Scale 1-10; mild, moderate, or severe)     Sometimes sharp, sometimes not there at all  4. PATTERN: Does the pain come and go, or is it constant?      Comes and goes randomly  5. CAUSE: What do you think is causing the pain?     Unsure  6. OTHER SYMPTOMS:  Do you have any other symptoms? (e.g., fever, abdomen pain, vomiting, leg weakness, burning with urination, blood in urine)     No  Protocols used: Flank Pain-A-AH  Message from Acala L sent at 02/27/2024 10:04 AM EDT  Summary: pain in kidney area   Patient having kidney pain, its on the left side. Patient states she has been having it since 02/20/24 and it has been intermittently.  Patient requesting appointment, [phone]

## 2024-02-28 ENCOUNTER — Other Ambulatory Visit: Payer: Self-pay | Admitting: Family Medicine

## 2024-02-28 ENCOUNTER — Ambulatory Visit: Admitting: Nurse Practitioner

## 2024-02-28 ENCOUNTER — Ambulatory Visit (INDEPENDENT_AMBULATORY_CARE_PROVIDER_SITE_OTHER): Admitting: Family Medicine

## 2024-02-28 ENCOUNTER — Encounter: Payer: Self-pay | Admitting: Family Medicine

## 2024-02-28 VITALS — BP 118/74 | HR 99 | Resp 16 | Ht 63.0 in | Wt 123.2 lb

## 2024-02-28 DIAGNOSIS — C50912 Malignant neoplasm of unspecified site of left female breast: Secondary | ICD-10-CM | POA: Diagnosis not present

## 2024-02-28 DIAGNOSIS — C784 Secondary malignant neoplasm of small intestine: Secondary | ICD-10-CM

## 2024-02-28 DIAGNOSIS — N1831 Chronic kidney disease, stage 3a: Secondary | ICD-10-CM

## 2024-02-28 DIAGNOSIS — R109 Unspecified abdominal pain: Secondary | ICD-10-CM | POA: Diagnosis not present

## 2024-02-28 LAB — POCT URINALYSIS DIPSTICK
Bilirubin, UA: NEGATIVE
Blood, UA: NEGATIVE
Glucose, UA: NEGATIVE
Ketones, UA: NEGATIVE
Leukocytes, UA: NEGATIVE
Nitrite, UA: NEGATIVE
Protein, UA: NEGATIVE
Spec Grav, UA: 1.015 (ref 1.010–1.025)
Urobilinogen, UA: 0.2 U/dL
pH, UA: 5 (ref 5.0–8.0)

## 2024-02-28 MED ORDER — OXYCODONE HCL 5 MG PO TABS: 5.0000 mg | ORAL_TABLET | ORAL | 0 refills | Status: DC | PRN

## 2024-02-28 NOTE — Progress Notes (Signed)
 Visit with Dr Glenard today. Needed oxycodone  refill.

## 2024-02-28 NOTE — Progress Notes (Signed)
 Name: Diamond Hinton   MRN: [id number]    DOB: 09-20-1955   Date:02/28/2024       Progress Note  Subjective  Chief Complaint  Chief Complaint  Patient presents with   Flank Pain    LL side rotates to the back    Discussed the use of AI scribe software for clinical note transcription with the patient, who gave verbal consent to proceed.  History of Present Illness Diamond Hinton is a 68 year old female with metastatic breast cancer who presents with left upper quadrant pain.  She experiences left upper quadrant pain that began within an hour of a contrast procedure performed on September 10th. The pain is described as stabbing and intermittent, sometimes sharp and other times a dull ache, radiating towards her back. A heating pad provides some relief. She also carried a heavy bag on her left side around the time the pain started, which she initially thought might be contributing to the pain.  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 She is currently on Ibrance  and letrozole  for treatment. She has a biliary drainage line and attempts to drink at least 65 ounces of water  daily to manage dehydratio however her husband has been very sick and she has not been as consistent with her hydration . She experiences slight nausea attributed to her cancer medication. No fever, chills, or urinary symptoms.  In July, she experienced similar pain after a CT scan, which resolved spontaneously within 24 hours. Her creatinine levels were elevated at that time. She was scheduled for follow-up blood work, including a CBC and comprehensive panel, but was unable to attend due to her husband's illness.  She took an oxycodone  pill from a previous prescription last night, which provided some relief and allowed her to sleep. The pain has been less severe today. She has one oxycodone  pill remaining and has not received a  new prescription since February 2024.  She is experiencing significant stress due to her husband's critical illness, impacting her hydration and sleep, with only about two hours of sleep per night.    Patient Active Problem List   Diagnosis Date Noted   Stage 3a chronic kidney disease (HCC) 08/20/2023   Breast cancer metastasized to small intestine, left (HCC) 07/19/2022   Biliary stent obstruction, initial encounter 07/16/2022   Palpitations 04/25/2022   Hyperkalemia 11/01/2021   Tumor lysis syndrome 11/01/2021   Goals of care, counseling/discussion    Palliative care encounter    Hypophosphatemia 09/05/2021   Constipation 09/04/2021   Stenosis of duodenum    Right upper quadrant abdominal pain 08/29/2021   Obstructive jaundice 08/29/2021   Primary malignant neoplasm of breast with metastasis (HCC) 08/29/2021   History of cholecystectomy 08/29/2021   Gastric outlet obstruction 08/29/2021   Hypokalemia 08/29/2021   Elevated lipase 08/29/2021   Reflux gastritis 08/04/2021   Diabetes mellitus (HCC) 04/18/2021   Hypertension associated with diabetes (HCC) 04/18/2021   Hyperlipidemia associated with type 2 diabetes mellitus (HCC) 02/19/2015   Elevated liver function tests 02/19/2015   Adult hypothyroidism 02/19/2015    Social History   Tobacco Use   Smoking status: Never   Smokeless tobacco: Never  Substance Use Topics   Alcohol use: No     Current Outpatient Medications:    acetaminophen  (TYLENOL ) 325 MG tablet, Take 325 mg by mouth every 6 (six) hours as needed (for pain and sometimes takes 2  if pain is worse)., Disp: , Rfl:    baclofen  (LIORESAL ) 10 MG tablet, Take 10 mg by mouth 3 (three) times daily as needed., Disp: , Rfl:    letrozole  (FEMARA ) 2.5 MG tablet, Take 1 tablet by mouth once daily, Disp: 90 tablet, Rfl: 0   levofloxacin  (LEVAQUIN ) 250 MG tablet, Take 2 tablets (500 mg total) by mouth daily., Disp: 60 tablet, Rfl: 0   levothyroxine  (SYNTHROID ) 88 MCG  tablet, Take 1 tablet by mouth once daily, Disp: 90 tablet, Rfl: 3   Multiple Vitamins-Minerals (CENTRUM VITAMINTS PO), Take 1 tablet by mouth daily., Disp: , Rfl:    ondansetron  (ZOFRAN ) 4 MG tablet, Take 1 tablet (4 mg total) by mouth every 8 (eight) hours as needed for nausea or vomiting., Disp: 20 tablet, Rfl: 2   oxyCODONE  (ROXICODONE ) 5 MG immediate release tablet, Take 1 tablet (5 mg total) by mouth every 4 (four) hours as needed for severe pain., Disp: 30 tablet, Rfl: 0   palbociclib  (IBRANCE ) 75 MG tablet, Take 1 tablet (75 mg total) by mouth daily. Take for 21 days on, 7 days off, repeat every 28 days., Disp: 21 tablet, Rfl: 0   palbociclib  (IBRANCE ) 75 MG tablet, Take 1 tablet (75 mg total) by mouth daily. Take for 21 days on, 7 days off, repeat every 28 days., Disp: 21 tablet, Rfl: 0   palbociclib  (IBRANCE ) 75 MG tablet, Take 1 tablet (75 mg total) by mouth daily. Take for 21 days on, 7 days off, repeat every 28 days., Disp: 21 tablet, Rfl: 0   pantoprazole  (PROTONIX ) 40 MG tablet, Take 1 tablet by mouth once daily, Disp: 90 tablet, Rfl: 0   potassium chloride  SA (KLOR-CON  M) 20 MEQ tablet, Take 1 tablet (20 mEq total) by mouth daily., Disp: 30 tablet, Rfl: 3   prochlorperazine  (COMPAZINE ) 5 MG tablet, Take 1 tablet (5 mg total) by mouth every 6 (six) hours as needed for refractory nausea / vomiting., Disp: 30 tablet, Rfl: 2   traZODone  (DESYREL ) 50 MG tablet, Take 50 mg by mouth at bedtime., Disp: , Rfl:  No current facility-administered medications for this visit.  Facility-Administered Medications Ordered in Other Visits:    cefTRIAXone  (ROCEPHIN ) 2 g in dextrose  5 % 50 mL IVPB, 2 g, Intravenous, Once, Caperilla, Marissa N, PA  Allergies  Allergen Reactions   Peanuts [Peanut Oil] Shortness Of Breath and Itching   Flexeril  [Cyclobenzaprine ] Palpitations    Elevated heart rate after third day of taking    ROS  Ten systems reviewed and is negative except as mentioned in HPI     Objective  Vitals:   02/28/24 1142  BP: 118/74  Pulse: 99  Resp: 16  SpO2: 99%  Weight: 123 lb 3.2 oz (55.9 kg)  Height: 5' 3 (1.6 m)    Body mass index is 21.82 kg/m.  Physical Exam CONSTITUTIONAL: Patient appears well-developed , pale  No distress. HEENT: Head atraumatic, normocephalic, neck supple. CARDIOVASCULAR: Normal rate, regular rhythm and normal heart sounds. No murmur heard. No BLE edema. PULMONARY: Effort normal and breath sounds normal. No respiratory distress. ABDOMINAL: Abdomen non-tender. No distention. Negative CVA tenderness MUSCULOSKELETAL: No pain during palpation of her back or flank,  PSYCHIATRIC: Patient has a normal mood and affect. Behavior is normal. Judgment and thought content normal.  Recent Results (from the past 2160 hours)  I-STAT creatinine     Status: Abnormal   Collection Time: 01/14/24 11:07 AM  Result Value Ref Range   Creatinine, Ser 1.20 (  H) 0.44 - 1.00 mg/dL  CMP (Cancer Center only)     Status: Abnormal   Collection Time: 01/29/24 11:21 AM  Result Value Ref Range   Sodium 132 (L) 135 - 145 mmol/L   Potassium 4.1 3.5 - 5.1 mmol/L   Chloride 106 98 - 111 mmol/L   CO2 18 (L) 22 - 32 mmol/L   Glucose, Bld 134 (H) 70 - 99 mg/dL    Comment: Glucose reference range applies only to samples taken after fasting for at least 8 hours.   BUN 34 (H) 8 - 23 mg/dL   Creatinine 8.54 (H) 9.55 - 1.00 mg/dL   Calcium  9.0 8.9 - 10.3 mg/dL   Total Protein 7.8 6.5 - 8.1 g/dL   Albumin  4.0 3.5 - 5.0 g/dL   AST 27 15 - 41 U/L   ALT 80 (H) 0 - 44 U/L   Alkaline Phosphatase 114 38 - 126 U/L   Total Bilirubin 0.7 0.0 - 1.2 mg/dL   GFR, Estimated 39 (L) >60 mL/min    Comment: (NOTE) Calculated using the CKD-EPI Creatinine Equation (2021)    Anion gap 8 5 - 15    Comment: Performed at [facility], [address] [place], [place] [address]  Cancer antigen 15-3     Status: Abnormal   Collection Time: 01/29/24 11:21 AM  Result Value Ref  Range   CA 15-3 28.3 (H) 0.0 - 25.0 U/mL    Comment: (NOTE) Roche Diagnostics Electrochemiluminescence Immunoassay (ECLIA) Values obtained with different assay methods or kits cannot be used interchangeably.  Results cannot be interpreted as absolute evidence of the presence or absence of malignant disease. Performed At: [facility] [address] [place], [place] [id number] Jennette Shorter MD Ey:[id number]   CA 27.29 (SERIAL MONITOR)     Status: None   Collection Time: 01/29/24 11:21 AM  Result Value Ref Range   CA 27.29 27.8 0.0 - 38.6 U/mL    Comment: (NOTE) Siemens Centaur Immunochemiluminometric Methodology [facility]) Values obtained with different assay methods or kits cannot be used interchangeably. Results cannot be interpreted as absolute evidence of the presence or absence of malignant disease. Performed At: [facility] [address] [place], [place] [id number] Jennette Shorter MD Ey:[id number]   CBC with Differential (Cancer Center Only)     Status: None   Collection Time: 01/29/24 11:21 AM  Result Value Ref Range   WBC Count 5.7 4.0 - 10.5 K/uL   RBC 3.99 3.87 - 5.11 MIL/uL   Hemoglobin 12.6 12.0 - 15.0 g/dL   HCT 62.6 63.9 - 53.9 %   MCV 93.5 80.0 - 100.0 fL   MCH 31.6 26.0 - 34.0 pg   MCHC 33.8 30.0 - 36.0 g/dL   RDW 85.5 88.4 - 84.4 %   Platelet Count 292 150 - 400 K/uL   nRBC 0.0 0.0 - 0.2 %   Neutrophils Relative % 65 %   Neutro Abs 3.8 1.7 - 7.7 K/uL   Lymphocytes Relative 27 %   Lymphs Abs 1.5 0.7 - 4.0 K/uL   Monocytes Relative 5 %   Monocytes Absolute 0.3 0.1 - 1.0 K/uL   Eosinophils Relative 1 %   Eosinophils Absolute 0.1 0.0 - 0.5 K/uL   Basophils Relative 1 %   Basophils Absolute 0.0 0.0 - 0.1 K/uL   Immature Granulocytes 1 %   Abs Immature Granulocytes 0.03 0.00 - 0.07 K/uL    Comment: Performed at [facility], [address] [place], [place]  [address]  POCT urinalysis dipstick     Status: None   Collection Time:  02/28/24 11:45 AM  Result Value Ref Range   Color, UA Yellow    Clarity, UA Cloudy    Glucose, UA Negative Negative   Bilirubin, UA Negative    Ketones, UA Negative    Spec Grav, UA 1.015 1.010 - 1.025   Blood, UA Negative    pH, UA 5.0 5.0 - 8.0   Protein, UA Negative Negative   Urobilinogen, UA 0.2 0.2 or 1.0 E.U./dL   Nitrite, UA Negative    Leukocytes, UA Negative Negative   Appearance yellow    Odor none       Assessment & Plan Left upper quadrant abdominal pain Intermittent left upper quadrant pain and left upper back pain, possibly due to contrast-induced nephropathy. History of similar pain post-CT scan. Stage IV breast cancer with metastasis complicates dehydration. Nausea likely from Ibrance . Differential includes musculoskeletal and pancreatic causes. - Order BMP with GFR to assess kidney function. - Order lipase level to rule out pancreatic involvement. - Message PCP  for pain medication management. - Advise follow-up with cancer center for comprehensive panel and CBC. - Discuss potential musculoskeletal component of pain.  Breast cancer with metastases to small intestine - advised to keep follow up and get labs ordered by Dr. Melanee GENET stage 3 a with drop of GFR after last IV contrast - recheck BMP today

## 2024-02-29 ENCOUNTER — Ambulatory Visit: Payer: Self-pay | Admitting: Family Medicine

## 2024-02-29 ENCOUNTER — Inpatient Hospital Stay: Attending: Oncology

## 2024-02-29 ENCOUNTER — Inpatient Hospital Stay (HOSPITAL_BASED_OUTPATIENT_CLINIC_OR_DEPARTMENT_OTHER): Admitting: Hospice and Palliative Medicine

## 2024-02-29 ENCOUNTER — Inpatient Hospital Stay

## 2024-02-29 ENCOUNTER — Encounter: Payer: Self-pay | Admitting: Oncology

## 2024-02-29 VITALS — BP 103/69 | HR 85 | Resp 18 | Wt 123.0 lb

## 2024-02-29 DIAGNOSIS — Z1721 Progesterone receptor positive status: Secondary | ICD-10-CM | POA: Diagnosis not present

## 2024-02-29 DIAGNOSIS — C50912 Malignant neoplasm of unspecified site of left female breast: Secondary | ICD-10-CM | POA: Insufficient documentation

## 2024-02-29 DIAGNOSIS — N179 Acute kidney failure, unspecified: Secondary | ICD-10-CM

## 2024-02-29 DIAGNOSIS — C784 Secondary malignant neoplasm of small intestine: Secondary | ICD-10-CM | POA: Insufficient documentation

## 2024-02-29 DIAGNOSIS — F32A Depression, unspecified: Secondary | ICD-10-CM

## 2024-02-29 DIAGNOSIS — Z79620 Long term (current) use of immunosuppressive biologic: Secondary | ICD-10-CM | POA: Diagnosis not present

## 2024-02-29 DIAGNOSIS — R5383 Other fatigue: Secondary | ICD-10-CM

## 2024-02-29 DIAGNOSIS — Z170 Estrogen receptor positive status [ER+]: Secondary | ICD-10-CM | POA: Insufficient documentation

## 2024-02-29 DIAGNOSIS — E871 Hypo-osmolality and hyponatremia: Secondary | ICD-10-CM | POA: Insufficient documentation

## 2024-02-29 DIAGNOSIS — Z1732 Human epidermal growth factor receptor 2 negative status: Secondary | ICD-10-CM | POA: Insufficient documentation

## 2024-02-29 DIAGNOSIS — Z79899 Other long term (current) drug therapy: Secondary | ICD-10-CM | POA: Diagnosis not present

## 2024-02-29 LAB — CBC WITH DIFFERENTIAL (CANCER CENTER ONLY)
Abs Immature Granulocytes: 0.04 K/uL (ref 0.00–0.07)
Basophils Absolute: 0.1 K/uL (ref 0.0–0.1)
Basophils Relative: 1 %
Eosinophils Absolute: 0.1 K/uL (ref 0.0–0.5)
Eosinophils Relative: 1 %
HCT: 38 % (ref 36.0–46.0)
Hemoglobin: 13.1 g/dL (ref 12.0–15.0)
Immature Granulocytes: 1 %
Lymphocytes Relative: 27 %
Lymphs Abs: 2.1 K/uL (ref 0.7–4.0)
MCH: 31.8 pg (ref 26.0–34.0)
MCHC: 34.5 g/dL (ref 30.0–36.0)
MCV: 92.2 fL (ref 80.0–100.0)
Monocytes Absolute: 0.5 K/uL (ref 0.1–1.0)
Monocytes Relative: 6 %
Neutro Abs: 5 K/uL (ref 1.7–7.7)
Neutrophils Relative %: 64 %
Platelet Count: 309 K/uL (ref 150–400)
RBC: 4.12 MIL/uL (ref 3.87–5.11)
RDW: 13.8 % (ref 11.5–15.5)
WBC Count: 7.8 K/uL (ref 4.0–10.5)
nRBC: 0 % (ref 0.0–0.2)

## 2024-02-29 LAB — BASIC METABOLIC PANEL WITH GFR
BUN/Creatinine Ratio: 24 (calc) — ABNORMAL HIGH (ref 6–22)
BUN: 46 mg/dL — ABNORMAL HIGH (ref 7–25)
CO2: 18 mmol/L — ABNORMAL LOW (ref 20–32)
Calcium: 9.6 mg/dL (ref 8.6–10.4)
Chloride: 99 mmol/L (ref 98–110)
Creat: 1.94 mg/dL — ABNORMAL HIGH (ref 0.60–1.00)
Glucose, Bld: 138 mg/dL — ABNORMAL HIGH (ref 65–99)
Potassium: 5.4 mmol/L — ABNORMAL HIGH (ref 3.5–5.3)
Sodium: 127 mmol/L — ABNORMAL LOW (ref 135–146)
eGFR: 27 mL/min/1.73m2 — ABNORMAL LOW (ref >=60)

## 2024-02-29 LAB — CMP (CANCER CENTER ONLY)
ALT: 49 U/L — ABNORMAL HIGH (ref 0–44)
AST: 36 U/L (ref 15–41)
Albumin: 4.1 g/dL (ref 3.5–5.0)
Alkaline Phosphatase: 90 U/L (ref 38–126)
Anion gap: 8 (ref 5–15)
BUN: 49 mg/dL — ABNORMAL HIGH (ref 8–23)
CO2: 15 mmol/L — ABNORMAL LOW (ref 22–32)
Calcium: 8.8 mg/dL — ABNORMAL LOW (ref 8.9–10.3)
Chloride: 103 mmol/L (ref 98–111)
Creatinine: 1.88 mg/dL — ABNORMAL HIGH (ref 0.44–1.00)
GFR, Estimated: 28 mL/min — ABNORMAL LOW (ref >60)
Glucose, Bld: 129 mg/dL — ABNORMAL HIGH (ref 70–99)
Potassium: 4.5 mmol/L (ref 3.5–5.1)
Sodium: 126 mmol/L — ABNORMAL LOW (ref 135–145)
Total Bilirubin: 0.7 mg/dL (ref 0.0–1.2)
Total Protein: 7.4 g/dL (ref 6.5–8.1)

## 2024-02-29 LAB — LIPASE: Lipase: 49 U/L (ref 7–60)

## 2024-02-29 MED ORDER — OXYCODONE HCL 5 MG PO TABS: 5.0000 mg | ORAL_TABLET | ORAL | 0 refills | Status: AC | PRN

## 2024-02-29 MED ORDER — SODIUM CHLORIDE 0.9 % IV SOLN
INTRAVENOUS | Status: AC
  Filled 2024-02-29 (×2): qty 250

## 2024-02-29 MED ORDER — MIRTAZAPINE 7.5 MG PO TABS: 7.5000 mg | ORAL_TABLET | Freq: Every day | ORAL | 1 refills | Status: AC

## 2024-02-29 NOTE — Progress Notes (Signed)
 Symptom Management Clinic [facility] [phone] [phone]  Patient Care Team: Myrla Jon HERO, MD as PCP - General (Family Medicine) Dannielle Arlean FALCON, RN (Inactive) as Oncology Nurse Navigator Melanee Annah BROCKS, MD as Consulting Physician (Oncology) Carolee Manus DASEN., MD (Ophthalmology)   NAME OF PATIENT: Diamond Hinton  [id number]  [birth year]   DATE OF VISIT: 02/29/24  REASON FOR CONSULT: Diamond Hinton is a 68 y.o. 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   INTERVAL HISTORY: Patient currently on treatment with Ibrance  plus letrozole   Patient had biliary drain exchanged on 02/20/2024.  Patient saw her PCP on 02/28/2024 for evaluation of left upper quadrant abdominal pain and left back pain.  Patient had a history of similar pain post CT scan with concern for possible contrast-induced nephropathy.  Today, patient reports that he is slowly improving.  She denies pain elsewhere.  States that she did take an oxycodone  last night to help her sleep.  Requests refill.  Patient reports poor oral intake.  Insomnia.  Patient is primary caregiver for her husband who is under hospice care.  Patient does endorse some depressive symptoms.  Denies any neurologic complaints. Denies recent fevers or illnesses. Denies any easy bleeding or bruising. Denies chest pain. Denies any nausea, vomiting, constipation, or diarrhea. Denies urinary complaints. Patient offers no further specific complaints today.   PAST MEDICAL HISTORY: Past Medical History:  Diagnosis Date   Anemia    Anxiety    Breast cancer (HCC)    Gallstones    GERD (gastroesophageal reflux disease)    Hyperlipidemia    Hypertension    Jaundice 08/29/2021   Rash 08/29/2021   Right upper quadrant abdominal pain 08/29/2021   UTI (urinary tract infection) 08/29/2021    PAST SURGICAL HISTORY:  Past Surgical  History:  Procedure Laterality Date   BILATERAL SALPINGECTOMY  04/03/1997   CHOLECYSTECTOMY N/A 07/07/2015   Procedure: LAPAROSCOPIC CHOLECYSTECTOMY ;  Surgeon: Laneta FALCON Luna, MD;  Location: [facility] ORS;  Service: General;  Laterality: N/A;   ESOPHAGOGASTRODUODENOSCOPY  08/30/2021   Procedure: ESOPHAGOGASTRODUODENOSCOPY (EGD);  Surgeon: Jinny Carmine, MD;  Location: [facility] ENDOSCOPY;  Service: Endoscopy;;   GALLBLADDER SURGERY  07/07/2015   [facility]   IR BILIARY DILITATION  08/22/2022   IR BILIARY DRAIN PLACEMENT WITH CHOLANGIOGRAM  08/31/2021   IR BILIARY STENT(S) NEW ACCESS WITH DRAIN  02/08/2022   IR CHOLANGIOGRAM EXISTING TUBE  08/17/2022   IR CONVERT BILIARY DRAIN TO INT EXT BILIARY DRAIN  09/02/2021   IR EXCHANGE BILIARY DRAIN  10/10/2021   IR EXCHANGE BILIARY DRAIN  12/05/2021   IR EXCHANGE BILIARY DRAIN  01/19/2022   IR EXCHANGE BILIARY DRAIN  10/13/2022   IR EXCHANGE BILIARY DRAIN  11/24/2022   IR EXCHANGE BILIARY DRAIN  01/18/2023   IR EXCHANGE BILIARY DRAIN  03/13/2023   IR EXCHANGE BILIARY DRAIN  04/24/2023   IR EXCHANGE BILIARY DRAIN  06/12/2023   IR EXCHANGE BILIARY DRAIN  08/09/2023   IR EXCHANGE BILIARY DRAIN  10/10/2023   IR EXCHANGE BILIARY DRAIN  12/12/2023   IR EXCHANGE BILIARY DRAIN  02/20/2024   IR INT EXT BILIARY DRAIN WITH CHOLANGIOGRAM  07/17/2022   IR RADIOLOGIST EVAL & MGMT  03/01/2022   IR RADIOLOGIST EVAL & MGMT  08/17/2022    HEMATOLOGY/ONCOLOGY HISTORY:  Oncology History  Primary malignant neoplasm of breast with metastasis (HCC)  08/29/2021 Initial Diagnosis   Metastatic breast cancer (HCC)  09/08/2021 - 09/08/2021 Chemotherapy   Patient is on Treatment Plan : BREAST Abemaciclib + Fulvestrant q28d     09/20/2021 Cancer Staging   Staging form: Breast, AJCC 8th Edition - Pathologic stage from 09/20/2021: Stage IV (pT1, pNX, pM1, G3, ER+, PR+, HER2-) - Signed by Melanee Annah BROCKS, MD on 09/20/2021 Multigene prognostic tests performed: None Histologic grading system: 3 grade system      ALLERGIES:  is allergic to peanuts [peanut oil] and flexeril  [cyclobenzaprine ].  MEDICATIONS:  Current Outpatient Medications  Medication Sig Dispense Refill   acetaminophen  (TYLENOL ) 325 MG tablet Take 325 mg by mouth every 6 (six) hours as needed (for pain and sometimes takes 2 if pain is worse).     baclofen  (LIORESAL ) 10 MG tablet Take 10 mg by mouth 3 (three) times daily as needed.     letrozole  (FEMARA ) 2.5 MG tablet Take 1 tablet by mouth once daily 90 tablet 0   levofloxacin  (LEVAQUIN ) 250 MG tablet Take 2 tablets (500 mg total) by mouth daily. 60 tablet 0   levothyroxine  (SYNTHROID ) 88 MCG tablet Take 1 tablet by mouth once daily 90 tablet 3   Multiple Vitamins-Minerals (CENTRUM VITAMINTS PO) Take 1 tablet by mouth daily.     ondansetron  (ZOFRAN ) 4 MG tablet Take 1 tablet (4 mg total) by mouth every 8 (eight) hours as needed for nausea or vomiting. 20 tablet 2   oxyCODONE  (ROXICODONE ) 5 MG immediate release tablet Take 1 tablet (5 mg total) by mouth every 4 (four) hours as needed for severe pain (pain score 7-10). 30 tablet 0   palbociclib  (IBRANCE ) 75 MG tablet Take 1 tablet (75 mg total) by mouth daily. Take for 21 days on, 7 days off, repeat every 28 days. 21 tablet 0   palbociclib  (IBRANCE ) 75 MG tablet Take 1 tablet (75 mg total) by mouth daily. Take for 21 days on, 7 days off, repeat every 28 days. 21 tablet 0   palbociclib  (IBRANCE ) 75 MG tablet Take 1 tablet (75 mg total) by mouth daily. Take for 21 days on, 7 days off, repeat every 28 days. 21 tablet 0   pantoprazole  (PROTONIX ) 40 MG tablet Take 1 tablet by mouth once daily 90 tablet 0   potassium chloride  SA (KLOR-CON  M) 20 MEQ tablet Take 1 tablet (20 mEq total) by mouth daily. 30 tablet 3   prochlorperazine  (COMPAZINE ) 5 MG tablet Take 1 tablet (5 mg total) by mouth every 6 (six) hours as needed for refractory nausea / vomiting. 30 tablet 2   traZODone  (DESYREL ) 50 MG tablet Take 50 mg by mouth at bedtime.     No  current facility-administered medications for this visit.   Facility-Administered Medications Ordered in Other Visits  Medication Dose Route Frequency Provider Last Rate Last Admin   cefTRIAXone  (ROCEPHIN ) 2 g in dextrose  5 % 50 mL IVPB  2 g Intravenous Once Caperilla, Marissa N, PA        VITAL SIGNS: There were no vitals taken for this visit. There were no vitals filed for this visit.  Estimated body mass index is 21.82 kg/m as calculated from the following:   Height as of 02/28/24: 5' 3 (1.6 m).   Weight as of 02/28/24: 123 lb 3.2 oz (55.9 kg).  LABS: CBC:    Component Value Date/Time   WBC 5.7 01/29/2024 1121   WBC 9.2 10/08/2023 2034   HGB 12.6 01/29/2024 1121   HGB 15.4 12/01/2015 0829   HCT 37.3 01/29/2024 1121   HCT  43.6 12/01/2015 0829   PLT 292 01/29/2024 1121   PLT 258 12/01/2015 0829   MCV 93.5 01/29/2024 1121   MCV 85 12/01/2015 0829   NEUTROABS 3.8 01/29/2024 1121   NEUTROABS 5.0 12/01/2015 0829   LYMPHSABS 1.5 01/29/2024 1121   LYMPHSABS 2.4 12/01/2015 0829   MONOABS 0.3 01/29/2024 1121   EOSABS 0.1 01/29/2024 1121   EOSABS 0.1 12/01/2015 0829   BASOSABS 0.0 01/29/2024 1121   BASOSABS 0.0 12/01/2015 0829   Comprehensive Metabolic Panel:    Component Value Date/Time   NA 127 (L) 02/28/2024 1232   NA 139 01/04/2023 0928   K 5.4 (H) 02/28/2024 1232   CL 99 02/28/2024 1232   CO2 18 (L) 02/28/2024 1232   BUN 46 (H) 02/28/2024 1232   BUN 12 01/04/2023 0928   CREATININE 1.94 (H) 02/28/2024 1232   GLUCOSE 138 (H) 02/28/2024 1232   CALCIUM  9.6 02/28/2024 1232   AST 27 01/29/2024 1121   ALT 80 (H) 01/29/2024 1121   ALKPHOS 114 01/29/2024 1121   BILITOT 0.7 01/29/2024 1121   PROT 7.8 01/29/2024 1121   PROT 7.8 10/18/2021 0903   ALBUMIN  4.0 01/29/2024 1121   ALBUMIN  4.8 10/18/2021 0903    RADIOGRAPHIC STUDIES: IR EXCHANGE BILIARY DRAIN Result Date: 02/20/2024 INDICATION: 68 year old female with history of metastatic breast cancer to the duodenum  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 The patient unfortunately presented with recurrent biliary obstruction in February 2024 requiring additional right-sided internal external biliary drain placement. She presents today for routine check and exchange. EXAM: Percutaneous biliary drainage catheter exchange MEDICATIONS: None. ANESTHESIA/SEDATION: None. FLUOROSCOPY: Radiation Exposure Index (as provided by the fluoroscopic device): 8.28 mGy Kerma COMPLICATIONS: None immediate. PROCEDURE: Informed written consent was obtained from the patient after a thorough discussion of the procedural risks, benefits and alternatives. All questions were addressed. Maximal Sterile Barrier Technique was utilized including caps, mask, sterile gowns, sterile gloves, sterile drape, hand hygiene and skin antiseptic. A timeout was performed prior to the initiation of the procedure. Contrast was injected through the existing tube. The tube is patent. The tube was transected and carefully removed over an Amplatz wire. A new 14 French internal/external biliary drainage catheter was advanced over the wire and formed. Contrast injection demonstrates a well-positioned catheter without evidence of obstruction or issue. The catheter was secured to the skin with an adhesive fixation device. IMPRESSION: Successful routine exchange of 14 French internal/external biliary drainage catheter. Electronically Signed   By: Wilkie Lent M.D.   On: 02/20/2024 14:56    PERFORMANCE STATUS (ECOG) : 1 - Symptomatic but completely ambulatory  Review of Systems Unless otherwise noted, a complete review of systems is negative.  Physical Exam General: NAD Cardiovascular: regular rate and rhythm Pulmonary: clear ant fields Abdomen: soft, nontender, + bowel sounds GU: no suprapubic tenderness Extremities:  no edema, no joint deformities Skin: no rashes Neurological: nonfocal  IMPRESSION/PLAN: Breast cancer - Ibrance  plus letrozole   Left flank pain -patient reports is improving.  Has history of flank pain postcontrast.  In light of AKI, certainly contrast nephropathy might be a factor.  BUN/creatinine ratio 28.  Suspect prerenal.  Will proceed with IV fluids.  Patient has slightly worse hyponatremia (126 today, down from 132 one month ago).  Suspect dehydration.  However, will add on hyponatremia workup at time of next labs.  Depression -patient endorses insomnia, poor oral intake.  Patient under  significant stress as she is a primary caregiver for her husband who is at home and hospice.  Will start low-dose mirtazapine  7.5 mg nightly.  Follow-up 1 week for labs/fluids.  Case and plan discussed with Dr. Melanee   Patient expressed understanding and was in agreement with this plan. She also understands that She can call clinic at any time with any questions, concerns, or complaints.   Thank you for allowing me to participate in the care of this very pleasant patient.   Time Total: 20 minutes  Visit consisted of counseling and education dealing with the complex and emotionally intense issues of symptom management in the setting of serious illness.Greater than 50%  of this time was spent counseling and coordinating care related to the above assessment and plan.  Signed by: Fonda Mower, PhD, NP-C

## 2024-02-29 NOTE — Progress Notes (Signed)
 Kidney pain since having contrast (has taken oxycodone  and heating pad, comes and goes), extreme stress, feels run down and possibly dehydrated. Low appetite especially at dinner time. Some nausea. Feels weak sometimes and like she needs to sit down.

## 2024-02-29 NOTE — Telephone Encounter (Signed)
 Contacted patient- apt provided for smc at 130 pm today. Pt stated that her husband was just placed under hospice care. She is uncertain if she can come. She needs to get her daughter to sit with her husband. She will msg/call back if she can not get here at 130pm today.SABRA

## 2024-03-01 LAB — CANCER ANTIGEN 15-3: CA 15-3: 32.6 U/mL — ABNORMAL HIGH (ref 0.0–25.0)

## 2024-03-03 ENCOUNTER — Other Ambulatory Visit: Payer: Self-pay | Admitting: Oncology

## 2024-03-03 ENCOUNTER — Other Ambulatory Visit (HOSPITAL_COMMUNITY): Payer: Self-pay

## 2024-03-03 DIAGNOSIS — C50912 Malignant neoplasm of unspecified site of left female breast: Secondary | ICD-10-CM

## 2024-03-04 ENCOUNTER — Other Ambulatory Visit (HOSPITAL_COMMUNITY): Payer: Self-pay

## 2024-03-04 ENCOUNTER — Other Ambulatory Visit: Payer: Self-pay

## 2024-03-04 MED ORDER — PALBOCICLIB 75 MG PO TABS
75.0000 mg | ORAL_TABLET | Freq: Every day | ORAL | 0 refills | Status: DC
Start: 2024-03-04 — End: 2024-04-07
  Filled 2024-03-04: qty 21, 21d supply, fill #0
  Filled 2024-03-06: qty 21, 28d supply, fill #0

## 2024-03-06 ENCOUNTER — Other Ambulatory Visit: Payer: Self-pay

## 2024-03-06 NOTE — Progress Notes (Signed)
 Specialty Pharmacy Refill Coordination Note  Diamond Hinton is a 68 y.o. female contacted today regarding refills of specialty medication(s) Palbociclib  (IBRANCE )   Patient requested Delivery   Delivery date: 03/18/24   Verified address: [address] [place] [address]   Medication will be filled on 10.06.25.

## 2024-03-07 ENCOUNTER — Inpatient Hospital Stay

## 2024-03-07 ENCOUNTER — Other Ambulatory Visit: Payer: Self-pay

## 2024-03-07 DIAGNOSIS — C50912 Malignant neoplasm of unspecified site of left female breast: Secondary | ICD-10-CM

## 2024-03-07 DIAGNOSIS — C784 Secondary malignant neoplasm of small intestine: Secondary | ICD-10-CM | POA: Diagnosis not present

## 2024-03-07 LAB — CMP (CANCER CENTER ONLY)
ALT: 44 U/L (ref 0–44)
AST: 39 U/L (ref 15–41)
Albumin: 3.9 g/dL (ref 3.5–5.0)
Alkaline Phosphatase: 74 U/L (ref 38–126)
Anion gap: 7 (ref 5–15)
BUN: 31 mg/dL — ABNORMAL HIGH (ref 8–23)
CO2: 22 mmol/L (ref 22–32)
Calcium: 8.7 mg/dL — ABNORMAL LOW (ref 8.9–10.3)
Chloride: 103 mmol/L (ref 98–111)
Creatinine: 1.48 mg/dL — ABNORMAL HIGH (ref 0.44–1.00)
GFR, Estimated: 38 mL/min — ABNORMAL LOW (ref >60)
Glucose, Bld: 100 mg/dL — ABNORMAL HIGH (ref 70–99)
Potassium: 4.1 mmol/L (ref 3.5–5.1)
Sodium: 132 mmol/L — ABNORMAL LOW (ref 135–145)
Total Bilirubin: 0.5 mg/dL (ref 0.0–1.2)
Total Protein: 6.9 g/dL (ref 6.5–8.1)

## 2024-03-07 LAB — OSMOLALITY: Osmolality: 290 mosm/kg (ref 275–295)

## 2024-03-07 LAB — OSMOLALITY, URINE: Osmolality, Ur: 144 mosm/kg — ABNORMAL LOW (ref 300–900)

## 2024-03-07 MED ORDER — SODIUM CHLORIDE 0.9 % IV SOLN
INTRAVENOUS | Status: DC
  Filled 2024-03-07 (×2): qty 250

## 2024-03-08 LAB — THYROID PANEL WITH TSH
Free Thyroxine Index: 1.9 (ref 1.2–4.9)
T3 Uptake Ratio: 27 % (ref 24–39)
T4, Total: 6.9 ug/dL (ref 4.5–12.0)
TSH: 7.39 u[IU]/mL — ABNORMAL HIGH (ref 0.450–4.500)

## 2024-03-15 ENCOUNTER — Other Ambulatory Visit: Payer: Self-pay | Admitting: Family Medicine

## 2024-04-07 ENCOUNTER — Other Ambulatory Visit: Payer: Self-pay | Admitting: Oncology

## 2024-04-07 ENCOUNTER — Other Ambulatory Visit (HOSPITAL_COMMUNITY): Payer: Self-pay

## 2024-04-07 DIAGNOSIS — C50912 Malignant neoplasm of unspecified site of left female breast: Secondary | ICD-10-CM

## 2024-04-08 ENCOUNTER — Other Ambulatory Visit: Payer: Self-pay | Admitting: Hospice and Palliative Medicine

## 2024-04-08 ENCOUNTER — Other Ambulatory Visit (HOSPITAL_COMMUNITY): Payer: Self-pay

## 2024-04-08 ENCOUNTER — Other Ambulatory Visit: Payer: Self-pay

## 2024-04-08 DIAGNOSIS — C50912 Malignant neoplasm of unspecified site of left female breast: Secondary | ICD-10-CM

## 2024-04-08 MED ORDER — PALBOCICLIB 75 MG PO TABS
75.0000 mg | ORAL_TABLET | Freq: Every day | ORAL | 0 refills | Status: DC
  Filled 2024-04-08: qty 21, 21d supply, fill #0
  Filled 2024-04-09: qty 21, 28d supply, fill #0

## 2024-04-09 ENCOUNTER — Other Ambulatory Visit: Payer: Self-pay

## 2024-04-09 ENCOUNTER — Other Ambulatory Visit: Payer: Self-pay | Admitting: Pharmacy Technician

## 2024-04-09 NOTE — Progress Notes (Signed)
 Specialty Pharmacy Refill Coordination Note  MARCHELLE RINELLA is a 68 y.o. female contacted today regarding refills of specialty medication(s) Palbociclib  (IBRANCE )   Patient requested Delivery   Delivery date: 04/11/24   Verified address: [address] [place] ([place])   Medication will be filled on: 04/10/24

## 2024-04-15 ENCOUNTER — Other Ambulatory Visit: Payer: Self-pay | Admitting: Interventional Radiology

## 2024-04-15 DIAGNOSIS — K831 Obstruction of bile duct: Secondary | ICD-10-CM

## 2024-04-15 NOTE — H&P (Incomplete)
 Chief Complaint: Metastatic Biliary Obstruction. Request is for Internal External Biliary Drain Exchange with Sedation.    Supervising Physician: Karalee Beat  Patient Status: [facility] - Out-pt  History of Present Illness: Diamond Hinton is a 68 y.o. female Inpatient.  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 IR placed a biliary drain on 3.22.23. Followed by covered common bile duct stent placement on 8.30.23. In Found to have recurrent biliary obstruction requiring additional right-sided internal external biliary drain placement on 2.27.25. Last exchanged on 9.10.25. 14 Fr with sedation.   Past Medical History:  Diagnosis Date   Anemia    Anxiety    Breast cancer (HCC)    Gallstones    GERD (gastroesophageal reflux disease)    Hyperlipidemia    Hypertension    Jaundice 08/29/2021   Rash 08/29/2021   Right upper quadrant abdominal pain 08/29/2021   UTI (urinary tract infection) 08/29/2021    Past Surgical History:  Procedure Laterality Date   BILATERAL SALPINGECTOMY  04/03/1997   CHOLECYSTECTOMY N/A 07/07/2015   Procedure: LAPAROSCOPIC CHOLECYSTECTOMY ;  Surgeon: Laneta JULIANNA Luna, MD;  Location: [facility] ORS;  Service: General;  Laterality: N/A;   ESOPHAGOGASTRODUODENOSCOPY  08/30/2021   Procedure: ESOPHAGOGASTRODUODENOSCOPY (EGD);  Surgeon: Jinny Carmine, MD;  Location: [facility] ENDOSCOPY;  Service: Endoscopy;;   GALLBLADDER SURGERY  07/07/2015   [facility]   IR BILIARY DILITATION  08/22/2022   IR BILIARY DRAIN PLACEMENT WITH CHOLANGIOGRAM  08/31/2021   IR BILIARY STENT(S) NEW ACCESS WITH DRAIN  02/08/2022   IR CHOLANGIOGRAM EXISTING TUBE  08/17/2022   IR CONVERT BILIARY DRAIN TO INT EXT BILIARY DRAIN  09/02/2021   IR EXCHANGE BILIARY DRAIN  10/10/2021   IR EXCHANGE BILIARY DRAIN  12/05/2021   IR EXCHANGE BILIARY DRAIN  01/19/2022   IR EXCHANGE BILIARY DRAIN  10/13/2022   IR EXCHANGE BILIARY DRAIN  11/24/2022   IR EXCHANGE  BILIARY DRAIN  01/18/2023   IR EXCHANGE BILIARY DRAIN  03/13/2023   IR EXCHANGE BILIARY DRAIN  04/24/2023   IR EXCHANGE BILIARY DRAIN  06/12/2023   IR EXCHANGE BILIARY DRAIN  08/09/2023   IR EXCHANGE BILIARY DRAIN  10/10/2023   IR EXCHANGE BILIARY DRAIN  12/12/2023   IR EXCHANGE BILIARY DRAIN  02/20/2024   IR INT EXT BILIARY DRAIN WITH CHOLANGIOGRAM  07/17/2022   IR RADIOLOGIST EVAL & MGMT  03/01/2022   IR RADIOLOGIST EVAL & MGMT  08/17/2022    Allergies: Peanuts [peanut oil] and Flexeril  [cyclobenzaprine ]  Medications: Prior to Admission medications   Medication Sig Start Date End Date Taking? Authorizing Provider  acetaminophen  (TYLENOL ) 325 MG tablet Take 325 mg by mouth every 6 (six) hours as needed (for pain and sometimes takes 2 if pain is worse).    [provider]  baclofen  (LIORESAL ) 10 MG tablet Take 10 mg by mouth 3 (three) times daily as needed.    [provider]  letrozole  (FEMARA ) 2.5 MG tablet Take 1 tablet by mouth once daily 04/08/24   Rao, Archana C, MD  levofloxacin  (LEVAQUIN ) 250 MG tablet Take 2 tablets (500 mg total) by mouth daily. 07/17/23   Melanee Annah BROCKS, MD  levothyroxine  (SYNTHROID ) 88 MCG tablet Take 1 tablet by mouth once daily 09/13/23   Bacigalupo, Angela M, MD  mirtazapine  (REMERON ) 7.5 MG tablet Take 1 tablet (7.5 mg total) by mouth at bedtime. 02/29/24   Borders, Fonda SAUNDERS, NP  Multiple Vitamins-Minerals (CENTRUM VITAMINTS PO) Take 1  tablet by mouth daily.    [provider]  ondansetron  (ZOFRAN ) 4 MG tablet Take 1 tablet (4 mg total) by mouth every 8 (eight) hours as needed for nausea or vomiting. 01/29/24   Melanee Annah BROCKS, MD  oxyCODONE  (ROXICODONE ) 5 MG immediate release tablet Take 1 tablet (5 mg total) by mouth every 4 (four) hours as needed for severe pain (pain score 7-10). 02/29/24   Borders, Fonda SAUNDERS, NP  palbociclib  (IBRANCE ) 75 MG tablet Take 1 tablet (75 mg total) by mouth daily. Take for 21 days on, 7 days off, repeat every 28 days.  06/19/23   Dasie Tinnie MATSU, NP  palbociclib  (IBRANCE ) 75 MG tablet Take 1 tablet (75 mg total) by mouth daily. Take for 21 days on, 7 days off, repeat every 28 days. 08/24/23   Dasie Tinnie MATSU, NP  palbociclib  (IBRANCE ) 75 MG tablet Take 1 tablet (75 mg total) by mouth daily. Take for 21 days on, 7 days off, repeat every 28 days. 04/08/24   Melanee Annah BROCKS, MD  pantoprazole  (PROTONIX ) 40 MG tablet Take 1 tablet by mouth once daily 03/17/24   Bacigalupo, Angela M, MD  potassium chloride  SA (KLOR-CON  M) 20 MEQ tablet Take 1 tablet (20 mEq total) by mouth daily. 01/16/23   Melanee Annah BROCKS, MD  prochlorperazine  (COMPAZINE ) 5 MG tablet Take 1 tablet (5 mg total) by mouth every 6 (six) hours as needed for refractory nausea / vomiting. 01/29/24   Melanee Annah BROCKS, MD     Family History  Problem Relation Age of Onset   Hypertension Mother    CVA Mother    Ulcers Mother    Emphysema Mother    Heart disease Father    Prostate cancer Father    Macular degeneration Father    Hypertension Sister    Diabetes Sister    Cervical polyp Sister    Hypertension Sister    Diabetes Sister    Lymphoma Sister     Social History   Socioeconomic History   Marital status: Married    Spouse name: Pasco   Number of children: 4   Years of education: Not on file   Highest education level: Not on file  Occupational History   Not on file  Tobacco Use   Smoking status: Never   Smokeless tobacco: Never  Vaping Use   Vaping status: Never Used  Substance and Sexual Activity   Alcohol use: No   Drug use: No   Sexual activity: Yes  Other Topics Concern   Not on file  Social History Narrative   Lives with spouse at home: Pasco has Parkinson's. No indoor pets.    Social Drivers of Corporate Investment Banker Strain: Low Risk  (02/06/2023)   Overall Financial Resource Strain (CARDIA)    Difficulty of Paying Living Expenses: Not hard at all  Food Insecurity: No Food Insecurity (02/06/2023)   Hunger Vital Sign     Worried About Running Out of Food in the Last Year: Never true    Ran Out of Food in the Last Year: Never true  Transportation Needs: No Transportation Needs (02/06/2023)   PRAPARE - Administrator, Civil Service (Medical): No    Lack of Transportation (Non-Medical): No  Physical Activity: Inactive (02/06/2023)   Exercise Vital Sign    Days of Exercise per Week: 0 days    Minutes of Exercise per Session: 0 min  Stress: No Stress Concern Present (11/24/2021)   [employer]  of Occupational Health - Occupational Stress Questionnaire    Feeling of Stress : Only a little  Social Connections: Moderately Isolated (02/06/2023)   Social Connection and Isolation Panel    Frequency of Communication with Friends and Family: More than three times a week    Frequency of Social Gatherings with Friends and Family: Never    Attends Religious Services: Never    Database Administrator or Organizations: No    Attends Banker Meetings: Never    Marital Status: Married    ECOG Status: {CHL ONC ECOG ED:8845999799}  Review of Systems: A 12 point ROS discussed and pertinent positives are indicated in the HPI above.  All other systems are negative.  Review of Systems  Vital Signs: There were no vitals taken for this visit.  Advance Care Plan: {Advance Care Eojw:73180}    Physical Exam  Imaging: No results found.  Labs:  CBC: Recent Labs    10/08/23 2034 10/17/23 1116 01/29/24 1121 02/29/24 1333  WBC 9.2 5.5 5.7 7.8  HGB 11.7* 12.7 12.6 13.1  HCT 34.2* 37.4 37.3 38.0  PLT 196 273 292 309    COAGS: Recent Labs    10/08/23 2034  INR 1.4*  APTT 30    BMP: Recent Labs    10/17/23 1115 01/14/24 1107 01/29/24 1121 02/28/24 1232 02/29/24 1333 03/07/24 1327  NA 139  --  132* 127* 126* 132*  K 4.0  --  4.1 5.4* 4.5 4.1  CL 107  --  106 99 103 103  CO2 22  --  18* 18* 15* 22  GLUCOSE 115*  --  134* 138* 129* 100*  BUN 21  --  34* 46* 49* 31*  CALCIUM   9.1  --  9.0 9.6 8.8* 8.7*  CREATININE 1.19*   < > 1.45* 1.94* 1.88* 1.48*  GFRNONAA 49*  --  39*  --  28* 38*   < > = values in this interval not displayed.    LIVER FUNCTION TESTS: Recent Labs    10/17/23 1115 01/29/24 1121 02/29/24 1333 03/07/24 1327  BILITOT 0.4 0.7 0.7 0.5  AST 41 27 36 39  ALT 44 80* 49* 44  ALKPHOS 74 114 90 74  PROT 7.3 7.8 7.4 6.9  ALBUMIN  4.0 4.0 4.1 3.9    TUMOR MARKERS: No results for input(s): AFPTM, CEA, CA199, CHROMGRNA in the last 8760 hours.  Assessment and Plan:  68 year old female. Inpatient.  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 IR placed a biliary drain on 3.22.23. Followed by covered common bile duct stent placement on 8.30.23. In Found to have recurrent biliary obstruction requiring additional right-sided internal external biliary drain placement on 2.27.25. Last exchanged on 9.10.25. 14 Fr with sedation.   PLAN: IR Image Guided Internal External Biliary Drain Exchange with Sedation.   Risks and benefits discussed with the patient including bleeding, infection, damage to adjacent structures, bowel perforation/fistula connection, and sepsis.  All of the patient's questions were answered, patient is agreeable to proceed. Consent signed and in chart.   Thank you for this interesting consult.  I greatly enjoyed meeting ROSEALYNN MATEUS and look forward to participating in their care.  A copy of this report was sent to the requesting provider on this date.  Electronically Signed: Delon JAYSON Beagle, NP 04/15/2024, 9:03 PM   I spent a total of {New PWEU:[id number]} {New Out-Pt:[id number]}  {Established Out-Pt:304952003} in face to face in  clinical consultation, greater than 50% of which was counseling/coordinating care for ***

## 2024-04-15 NOTE — Progress Notes (Signed)
 Patient for IR Biliary Drain Exchange on Wed 04/16/24, I called and spoke with the patient on the phone and gave pre-procedure instructions. Pt was made aware to be here at 11:30a and check in at the H&V entrance. Pt stated understanding. Called 04/15/24

## 2024-04-16 ENCOUNTER — Ambulatory Visit
Admission: RE | Admit: 2024-04-16 | Discharge: 2024-04-16 | Disposition: A | Discharge status: Home / Self Care | Source: Ambulatory Visit | Attending: Interventional Radiology | Admitting: Interventional Radiology

## 2024-04-16 DIAGNOSIS — Z4803 Encounter for change or removal of drains: Secondary | ICD-10-CM | POA: Insufficient documentation

## 2024-04-16 DIAGNOSIS — Z434 Encounter for attention to other artificial openings of digestive tract: Secondary | ICD-10-CM | POA: Diagnosis not present

## 2024-04-16 DIAGNOSIS — K831 Obstruction of bile duct: Secondary | ICD-10-CM | POA: Diagnosis not present

## 2024-04-16 HISTORY — PX: IR EXCHANGE BILIARY DRAIN: IMG6046

## 2024-04-16 MED ORDER — SILVER NITRATE-POT NITRATE 75-25 % EX MISC
1.0000 | Freq: Once | CUTANEOUS | Status: AC
  Administered 2024-04-16: 1 via TOPICAL

## 2024-04-16 MED ORDER — LIDOCAINE HCL 1 % IJ SOLN
INTRAMUSCULAR | Status: AC
  Filled 2024-04-16: qty 20

## 2024-04-16 MED ORDER — IOHEXOL 300 MG/ML  SOLN
50.0000 mL | Freq: Once | INTRAMUSCULAR | Status: AC | PRN
  Administered 2024-04-16: 8 mL

## 2024-04-16 MED ORDER — SILVER NITRATE-POT NITRATE 75-25 % EX MISC
CUTANEOUS | Status: AC
  Filled 2024-04-16: qty 10

## 2024-04-16 MED ORDER — LIDOCAINE HCL 1 % IJ SOLN
20.0000 mL | Freq: Once | INTRAMUSCULAR | Status: AC
  Administered 2024-04-16: 10 mL

## 2024-04-29 ENCOUNTER — Inpatient Hospital Stay: Attending: Oncology

## 2024-04-29 DIAGNOSIS — C784 Secondary malignant neoplasm of small intestine: Secondary | ICD-10-CM | POA: Diagnosis not present

## 2024-04-29 DIAGNOSIS — Z1721 Progesterone receptor positive status: Secondary | ICD-10-CM | POA: Diagnosis not present

## 2024-04-29 DIAGNOSIS — C50912 Malignant neoplasm of unspecified site of left female breast: Secondary | ICD-10-CM | POA: Insufficient documentation

## 2024-04-29 DIAGNOSIS — Z1732 Human epidermal growth factor receptor 2 negative status: Secondary | ICD-10-CM | POA: Insufficient documentation

## 2024-04-29 DIAGNOSIS — Z170 Estrogen receptor positive status [ER+]: Secondary | ICD-10-CM | POA: Diagnosis not present

## 2024-04-29 LAB — CBC WITH DIFFERENTIAL (CANCER CENTER ONLY)
Abs Immature Granulocytes: 0.02 K/uL (ref 0.00–0.07)
Basophils Absolute: 0 K/uL (ref 0.0–0.1)
Basophils Relative: 1 %
Eosinophils Absolute: 0.1 K/uL (ref 0.0–0.5)
Eosinophils Relative: 1 %
HCT: 35.8 % — ABNORMAL LOW (ref 36.0–46.0)
Hemoglobin: 12.2 g/dL (ref 12.0–15.0)
Immature Granulocytes: 0 %
Lymphocytes Relative: 29 %
Lymphs Abs: 1.8 K/uL (ref 0.7–4.0)
MCH: 32.4 pg (ref 26.0–34.0)
MCHC: 34.1 g/dL (ref 30.0–36.0)
MCV: 95.2 fL (ref 80.0–100.0)
Monocytes Absolute: 0.2 K/uL (ref 0.1–1.0)
Monocytes Relative: 4 %
Neutro Abs: 4.1 K/uL (ref 1.7–7.7)
Neutrophils Relative %: 65 %
Platelet Count: 258 K/uL (ref 150–400)
RBC: 3.76 MIL/uL — ABNORMAL LOW (ref 3.87–5.11)
RDW: 13.3 % (ref 11.5–15.5)
WBC Count: 6.3 K/uL (ref 4.0–10.5)
nRBC: 0 % (ref 0.0–0.2)

## 2024-04-29 LAB — CMP (CANCER CENTER ONLY)
ALT: 24 U/L (ref 0–44)
AST: 22 U/L (ref 15–41)
Albumin: 4 g/dL (ref 3.5–5.0)
Alkaline Phosphatase: 72 U/L (ref 38–126)
Anion gap: 9 (ref 5–15)
BUN: 31 mg/dL — ABNORMAL HIGH (ref 8–23)
CO2: 19 mmol/L — ABNORMAL LOW (ref 22–32)
Calcium: 8.8 mg/dL — ABNORMAL LOW (ref 8.9–10.3)
Chloride: 107 mmol/L (ref 98–111)
Creatinine: 1.38 mg/dL — ABNORMAL HIGH (ref 0.44–1.00)
GFR, Estimated: 41 mL/min — ABNORMAL LOW (ref >60)
Glucose, Bld: 90 mg/dL (ref 70–99)
Potassium: 4.1 mmol/L (ref 3.5–5.1)
Sodium: 135 mmol/L (ref 135–145)
Total Bilirubin: 0.6 mg/dL (ref 0.0–1.2)
Total Protein: 7.3 g/dL (ref 6.5–8.1)

## 2024-04-30 ENCOUNTER — Other Ambulatory Visit: Payer: Self-pay | Admitting: Oncology

## 2024-04-30 ENCOUNTER — Other Ambulatory Visit: Payer: Self-pay

## 2024-04-30 DIAGNOSIS — C50912 Malignant neoplasm of unspecified site of left female breast: Secondary | ICD-10-CM

## 2024-04-30 LAB — CANCER ANTIGEN 15-3: CA 15-3: 28.4 U/mL — ABNORMAL HIGH (ref 0.0–25.0)

## 2024-04-30 MED ORDER — PALBOCICLIB 75 MG PO TABS
75.0000 mg | ORAL_TABLET | Freq: Every day | ORAL | 0 refills | Status: DC
  Filled 2024-04-30: qty 21, 21d supply, fill #0
  Filled 2024-05-02: qty 21, 28d supply, fill #0

## 2024-05-02 ENCOUNTER — Other Ambulatory Visit (HOSPITAL_COMMUNITY): Payer: Self-pay

## 2024-05-02 ENCOUNTER — Other Ambulatory Visit: Payer: Self-pay

## 2024-05-06 ENCOUNTER — Other Ambulatory Visit: Payer: Self-pay

## 2024-05-06 ENCOUNTER — Other Ambulatory Visit (HOSPITAL_COMMUNITY): Payer: Self-pay

## 2024-05-06 NOTE — Progress Notes (Signed)
 Specialty Pharmacy Refill Coordination Note  Spoke with Shakera Ebrahimi is a 68 y.o. female contacted today regarding refills of specialty medication(s) Palbociclib  (IBRANCE )  Doses on hand: 1, next cycle starts 12/4  Patient requested: Delivery   Delivery date: 05/13/24   Verified address: [address] [place] [address]  Medication will be filled on 05/12/24

## 2024-05-12 ENCOUNTER — Other Ambulatory Visit: Payer: Self-pay

## 2024-05-15 ENCOUNTER — Telehealth: Payer: Self-pay | Admitting: Pharmacy Technician

## 2024-05-15 ENCOUNTER — Other Ambulatory Visit (HOSPITAL_COMMUNITY): Payer: Self-pay

## 2024-05-15 NOTE — Telephone Encounter (Signed)
 Oral Oncology Patient Advocate Encounter   Was successful in securing patient a $7,000 grant from [employer] (PAF) to provide copayment coverage for Ibrance .  This will keep the out of pocket expense at $0.     The billing information is as follows and has been shared with [facility].   RxBin: [id number] PCN:  [id number] Member ID: [id number] Group ID: [id number] Dates of Eligibility: 11/17/2023 through 05/15/2025    Diamond Hinton (Patty) Chet Burnet, CPhT  [facility], Zelda Salmon, [facility] - Oral Chemotherapy Patient Advocate Specialist III Phone: [phone]  Fax: [phone]

## 2024-05-22 NOTE — Progress Notes (Signed)
 Diamond Hinton                                          MRN: [id number]   05/22/2024   The [facility]BC[facility] Quality Team Specialist reviewed this patient medical record for the purposes of chart review for care gap closure. The following were reviewed: chart review for care gap closure-breast cancer screening.    VBCI Quality Team

## 2024-05-27 ENCOUNTER — Other Ambulatory Visit: Payer: Self-pay

## 2024-05-28 ENCOUNTER — Other Ambulatory Visit: Payer: Self-pay | Admitting: Oncology

## 2024-05-28 ENCOUNTER — Other Ambulatory Visit: Payer: Self-pay

## 2024-05-28 DIAGNOSIS — C50912 Malignant neoplasm of unspecified site of left female breast: Secondary | ICD-10-CM

## 2024-05-28 MED ORDER — PALBOCICLIB 75 MG PO TABS
75.0000 mg | ORAL_TABLET | Freq: Every day | ORAL | 0 refills | Status: DC
  Filled 2024-05-28: qty 21, 28d supply, fill #0

## 2024-05-28 NOTE — Progress Notes (Signed)
 Specialty Pharmacy Refill Coordination Note  Diamond Hinton is a 68 y.o. female contacted today regarding refills of specialty medication(s) Palbociclib  (IBRANCE )   Patient requested Delivery   Delivery date: 06/09/24   Verified address: [address] [place] [address]   Medication will be filled on: 06/06/24  **USE PAF GRANT ON FILE**

## 2024-05-28 NOTE — Progress Notes (Signed)
 Specialty Pharmacy Ongoing Clinical Assessment Note  Diamond Hinton is a 68 y.o. female who is being followed by the specialty pharmacy service for RxSp Oncology   Patient's specialty medication(s) reviewed today: Palbociclib  (IBRANCE )   Missed doses in the last 4 weeks: 0   Patient/Caregiver did not have any additional questions or concerns.   Therapeutic benefit summary: Patient is achieving benefit   Adverse events/side effects summary: No adverse events/side effects   Patient's therapy is appropriate to: Continue    Goals Addressed             This Visit's Progress    Slow Disease Progression   On track    Patient is on track. Patient will maintain adherence and adhere to provider and/or lab appointments. Per last visit note from 01/29/24, tumor markers have remained stable and within normal limits. CA 15-3 from 04/29/24 remained stable.          Follow up: 6 months  [name] Specialty Pharmacist

## 2024-06-06 ENCOUNTER — Other Ambulatory Visit: Payer: Self-pay

## 2024-06-12 ENCOUNTER — Other Ambulatory Visit: Payer: Self-pay | Admitting: Family Medicine

## 2024-06-19 ENCOUNTER — Other Ambulatory Visit: Payer: Self-pay | Admitting: Interventional Radiology

## 2024-06-19 DIAGNOSIS — K831 Obstruction of bile duct: Secondary | ICD-10-CM

## 2024-06-24 ENCOUNTER — Telehealth: Payer: Self-pay

## 2024-06-24 NOTE — Telephone Encounter (Signed)
 Patient for IR Chole Tube Exc on Wed 06/25/24, I called and spoke with the patient on the phone and gave pre-procedure instructions. Pt was made aware to be here at 1:45p and check in at the H&V entrance. Pt stated understanding. Called 06/24/24

## 2024-06-25 ENCOUNTER — Ambulatory Visit
Admission: RE | Admit: 2024-06-25 | Discharge: 2024-06-25 | Disposition: A | Discharge status: Home / Self Care | Source: Ambulatory Visit | Attending: Interventional Radiology | Admitting: Interventional Radiology

## 2024-06-25 DIAGNOSIS — K831 Obstruction of bile duct: Secondary | ICD-10-CM | POA: Insufficient documentation

## 2024-06-25 DIAGNOSIS — Z4803 Encounter for change or removal of drains: Secondary | ICD-10-CM | POA: Diagnosis present

## 2024-06-25 HISTORY — PX: IR EXCHANGE BILIARY DRAIN: IMG6046

## 2024-06-25 MED ORDER — IOHEXOL 300 MG/ML  SOLN
10.0000 mL | Freq: Once | INTRAMUSCULAR | Status: AC | PRN
  Administered 2024-06-25: 10 mL

## 2024-06-25 NOTE — Addendum Note (Signed)
 Encounter addended by: Lottie Darleene LABOR on: 06/25/2024 1:47 PM  Actions taken: Imaging Exam ended

## 2024-06-25 NOTE — Addendum Note (Signed)
 Encounter addended by: Harvey Mercy NOVAK, RT on: 06/25/2024 1:46 PM  Actions taken: Imaging Exam ended

## 2024-06-27 ENCOUNTER — Other Ambulatory Visit (HOSPITAL_COMMUNITY): Payer: Self-pay

## 2024-06-27 ENCOUNTER — Other Ambulatory Visit: Payer: Self-pay

## 2024-06-27 ENCOUNTER — Other Ambulatory Visit: Payer: Self-pay | Admitting: Oncology

## 2024-06-27 DIAGNOSIS — C50912 Malignant neoplasm of unspecified site of left female breast: Secondary | ICD-10-CM

## 2024-06-27 MED ORDER — PALBOCICLIB 75 MG PO TABS
75.0000 mg | ORAL_TABLET | Freq: Every day | ORAL | 0 refills | Status: AC
  Filled 2024-06-27: qty 21, 21d supply, fill #0
  Filled 2024-06-30: qty 21, 28d supply, fill #0

## 2024-06-30 ENCOUNTER — Other Ambulatory Visit: Payer: Self-pay

## 2024-06-30 ENCOUNTER — Other Ambulatory Visit (HOSPITAL_COMMUNITY): Payer: Self-pay

## 2024-07-02 ENCOUNTER — Other Ambulatory Visit: Payer: Self-pay | Admitting: Pharmacy Technician

## 2024-07-02 ENCOUNTER — Other Ambulatory Visit: Payer: Self-pay

## 2024-07-02 NOTE — Progress Notes (Signed)
 Specialty Pharmacy Refill Coordination Note  Diamond Hinton is a 69 y.o. female contacted today regarding refills of specialty medication(s) Palbociclib  (IBRANCE )   Patient requested Delivery   Delivery date: 07/04/24   Verified address: [address]  [place]   Medication will be filled on: 07/02/24

## 2024-07-02 NOTE — Progress Notes (Signed)
 Diamond Hinton                                          MRN: [id number]   07/02/2024   The VBCI Quality Team Specialist reviewed this patient medical record for the purposes of chart review for care gap closure. The following were reviewed: chart review for care gap closure-diabetic eye exam.    VBCI Quality Team

## 2024-07-04 ENCOUNTER — Other Ambulatory Visit: Payer: Self-pay | Admitting: Oncology

## 2024-07-04 DIAGNOSIS — C50912 Malignant neoplasm of unspecified site of left female breast: Secondary | ICD-10-CM

## 2024-07-22 ENCOUNTER — Ambulatory Visit

## 2024-08-05 ENCOUNTER — Ambulatory Visit: Admitting: Oncology

## 2024-08-05 ENCOUNTER — Other Ambulatory Visit
